# Patient Record
Sex: Female | Born: 1940 | Race: White | Hispanic: No | Smoking: Never smoker
Health system: Southern US, Community
[De-identification: ages and names within clinical notes are randomized; demographics above are authoritative.]

## PROBLEM LIST (undated history)

## (undated) DIAGNOSIS — E559 Vitamin D deficiency, unspecified: Secondary | ICD-10-CM

## (undated) DIAGNOSIS — E669 Obesity, unspecified: Secondary | ICD-10-CM

## (undated) DIAGNOSIS — F419 Anxiety disorder, unspecified: Secondary | ICD-10-CM

## (undated) DIAGNOSIS — E7800 Pure hypercholesterolemia, unspecified: Secondary | ICD-10-CM

## (undated) DIAGNOSIS — I272 Pulmonary hypertension, unspecified: Secondary | ICD-10-CM

## (undated) DIAGNOSIS — M8580 Other specified disorders of bone density and structure, unspecified site: Secondary | ICD-10-CM

## (undated) DIAGNOSIS — I77810 Thoracic aortic ectasia: Secondary | ICD-10-CM

## (undated) DIAGNOSIS — I10 Essential (primary) hypertension: Secondary | ICD-10-CM

## (undated) DIAGNOSIS — M25519 Pain in unspecified shoulder: Secondary | ICD-10-CM

## (undated) DIAGNOSIS — R011 Cardiac murmur, unspecified: Secondary | ICD-10-CM

## (undated) DIAGNOSIS — J189 Pneumonia, unspecified organism: Secondary | ICD-10-CM

## (undated) DIAGNOSIS — I350 Nonrheumatic aortic (valve) stenosis: Secondary | ICD-10-CM

## (undated) DIAGNOSIS — I6529 Occlusion and stenosis of unspecified carotid artery: Secondary | ICD-10-CM

## (undated) DIAGNOSIS — R87629 Unspecified abnormal cytological findings in specimens from vagina: Secondary | ICD-10-CM

## (undated) DIAGNOSIS — R001 Bradycardia, unspecified: Secondary | ICD-10-CM

## (undated) DIAGNOSIS — I2510 Atherosclerotic heart disease of native coronary artery without angina pectoris: Secondary | ICD-10-CM

## (undated) HISTORY — DX: Vitamin D deficiency, unspecified: E55.9

## (undated) HISTORY — DX: Obesity, unspecified: E66.9

## (undated) HISTORY — DX: Nonrheumatic aortic (valve) stenosis: I35.0

## (undated) HISTORY — DX: Pain in unspecified shoulder: M25.519

## (undated) HISTORY — DX: Thoracic aortic ectasia: I77.810

## (undated) HISTORY — DX: Pulmonary hypertension, unspecified: I27.20

## (undated) HISTORY — DX: Pure hypercholesterolemia, unspecified: E78.00

## (undated) HISTORY — DX: Occlusion and stenosis of unspecified carotid artery: I65.29

## (undated) HISTORY — DX: Essential (primary) hypertension: I10

## (undated) HISTORY — DX: Other specified disorders of bone density and structure, unspecified site: M85.80

## (undated) HISTORY — DX: Bradycardia, unspecified: R00.1

## (undated) HISTORY — PX: CATARACT EXTRACTION, BILATERAL: SHX1313

## (undated) HISTORY — PX: TONSILLECTOMY: SUR1361

## (undated) HISTORY — DX: Unspecified abnormal cytological findings in specimens from vagina: R87.629

## (undated) HISTORY — DX: Pneumonia, unspecified organism: J18.9

## (undated) HISTORY — DX: Atherosclerotic heart disease of native coronary artery without angina pectoris: I25.10

## (undated) HISTORY — PX: CARDIAC CATHETERIZATION: SHX172

---

## 1988-04-08 HISTORY — PX: CHOLECYSTECTOMY: SHX55

## 1992-04-08 DIAGNOSIS — R87629 Unspecified abnormal cytological findings in specimens from vagina: Secondary | ICD-10-CM

## 1992-04-08 HISTORY — DX: Unspecified abnormal cytological findings in specimens from vagina: R87.629

## 1993-04-08 DIAGNOSIS — J189 Pneumonia, unspecified organism: Secondary | ICD-10-CM

## 1993-04-08 HISTORY — DX: Pneumonia, unspecified organism: J18.9

## 1999-01-29 ENCOUNTER — Encounter: Payer: Self-pay | Admitting: Obstetrics and Gynecology

## 1999-01-29 ENCOUNTER — Encounter: Admission: RE | Admit: 1999-01-29 | Discharge: 1999-01-29 | Payer: Self-pay | Admitting: Obstetrics and Gynecology

## 1999-07-05 ENCOUNTER — Encounter: Payer: Self-pay | Admitting: Internal Medicine

## 1999-07-05 ENCOUNTER — Encounter: Admission: RE | Admit: 1999-07-05 | Discharge: 1999-07-05 | Payer: Self-pay | Admitting: Internal Medicine

## 2000-09-04 ENCOUNTER — Encounter: Admission: RE | Admit: 2000-09-04 | Discharge: 2000-09-04 | Payer: Self-pay | Admitting: Internal Medicine

## 2000-09-04 ENCOUNTER — Encounter: Payer: Self-pay | Admitting: Internal Medicine

## 2001-09-08 ENCOUNTER — Encounter: Admission: RE | Admit: 2001-09-08 | Discharge: 2001-09-08 | Payer: Self-pay | Admitting: Internal Medicine

## 2001-09-08 ENCOUNTER — Encounter: Payer: Self-pay | Admitting: Internal Medicine

## 2001-09-14 ENCOUNTER — Encounter: Admission: RE | Admit: 2001-09-14 | Discharge: 2001-09-14 | Payer: Self-pay | Admitting: Internal Medicine

## 2001-09-14 ENCOUNTER — Encounter: Payer: Self-pay | Admitting: Internal Medicine

## 2002-09-20 ENCOUNTER — Encounter: Admission: RE | Admit: 2002-09-20 | Discharge: 2002-09-20 | Payer: Self-pay | Admitting: Internal Medicine

## 2002-09-20 ENCOUNTER — Encounter: Payer: Self-pay | Admitting: Internal Medicine

## 2003-09-21 ENCOUNTER — Encounter: Admission: RE | Admit: 2003-09-21 | Discharge: 2003-09-21 | Payer: Self-pay | Admitting: Internal Medicine

## 2004-08-20 ENCOUNTER — Other Ambulatory Visit: Admission: RE | Admit: 2004-08-20 | Discharge: 2004-08-20 | Payer: Self-pay | Admitting: Internal Medicine

## 2004-09-24 ENCOUNTER — Encounter: Admission: RE | Admit: 2004-09-24 | Discharge: 2004-09-24 | Payer: Self-pay | Admitting: Internal Medicine

## 2005-09-25 ENCOUNTER — Encounter: Admission: RE | Admit: 2005-09-25 | Discharge: 2005-09-25 | Payer: Self-pay | Admitting: Internal Medicine

## 2006-04-08 DIAGNOSIS — I2510 Atherosclerotic heart disease of native coronary artery without angina pectoris: Secondary | ICD-10-CM

## 2006-04-08 HISTORY — DX: Atherosclerotic heart disease of native coronary artery without angina pectoris: I25.10

## 2006-04-28 ENCOUNTER — Encounter: Admission: RE | Admit: 2006-04-28 | Discharge: 2006-04-28 | Payer: Self-pay | Admitting: Cardiology

## 2006-05-02 ENCOUNTER — Ambulatory Visit (HOSPITAL_COMMUNITY): Admission: RE | Admit: 2006-05-02 | Discharge: 2006-05-02 | Payer: Self-pay | Admitting: Cardiology

## 2006-09-29 ENCOUNTER — Encounter: Admission: RE | Admit: 2006-09-29 | Discharge: 2006-09-29 | Payer: Self-pay | Admitting: Internal Medicine

## 2008-05-13 ENCOUNTER — Other Ambulatory Visit: Admission: RE | Admit: 2008-05-13 | Discharge: 2008-05-13 | Payer: Self-pay | Admitting: Internal Medicine

## 2008-05-26 ENCOUNTER — Encounter: Admission: RE | Admit: 2008-05-26 | Discharge: 2008-05-26 | Payer: Self-pay | Admitting: Internal Medicine

## 2009-05-29 ENCOUNTER — Encounter: Admission: RE | Admit: 2009-05-29 | Discharge: 2009-05-29 | Payer: Self-pay | Admitting: Internal Medicine

## 2010-04-28 ENCOUNTER — Other Ambulatory Visit: Payer: Self-pay | Admitting: Internal Medicine

## 2010-04-28 DIAGNOSIS — Z1239 Encounter for other screening for malignant neoplasm of breast: Secondary | ICD-10-CM

## 2010-05-30 ENCOUNTER — Ambulatory Visit
Admission: RE | Admit: 2010-05-30 | Discharge: 2010-05-30 | Disposition: A | Discharge status: Home / Self Care | Payer: Medicare Other | Source: Ambulatory Visit | Attending: Internal Medicine | Admitting: Internal Medicine

## 2010-05-30 DIAGNOSIS — Z1239 Encounter for other screening for malignant neoplasm of breast: Secondary | ICD-10-CM

## 2010-08-24 NOTE — H&P (Signed)
[name], [name]                  ACCOUNT NO.:  [id number]   MEDICAL RECORD NO.:  [id number]          PATIENT TYPE:  OIB   LOCATION:  2899                         FACILITY:  [facility]   PHYSICIAN:  Armanda Magic, M.D.     DATE OF BIRTH:  [birth year]   DATE OF ADMISSION:  05/02/2006  DATE OF DISCHARGE:  05/02/2006                              HISTORY & PHYSICAL   PRIMARY CARE PHYSICIAN:  Theressa Millard, M.D.   REASON FOR EVALUATION:  Chest pain and abnormal Cardiolite.   HISTORY OF PRESENT ILLNESS:  [name] is a 70 year old Caucasian  female with a history of obesity, hypertension and moderate aortic  stenosis by 2-D echo in 2006.  She also has a history of dyslipidemia  and recently underwent an exercise treadmill test secondary to multiple  cardiac risk factors.  The exercise treadmill test was performed by Dr.  Theressa Millard which was abnormal.  She was then referred to Dr. Armanda Magic by Dr. Earl Gala for stress Cardiolite.  She admits to having chest  pressure with both an exertional and a nonexertional component.  She  also admits to being under a great deal of stress secondary to her  husband undergoing cardiac surgery for coronary artery disease.  Her  stress Cardiolite revealed a small area of decreased uptake in the basal  septum which was felt to be secondary to breast attenuation however,  coronary artery disease could not be ruled out.  LV function was normal  and an EKG was positive for ischemia.  She is being admitted as an  outpatient for a diagnostic cardiac catheterization to rule out  ischemia.  Today the patient denied chest pain, shortness of breath,  nausea, vomiting, diaphoresis, or dizziness.   PAST MEDICAL HISTORY:  1. Obesity.  2. Hypertension.  3. Dyslipidemia.  4. Moderate aortic stenosis by 2-D echocardiogram in May 2006.  5. Fibrocystic breast disease.  6. Orthopedic pain.  7. Osteopenia.  8. Impaired fasting glucose.  9. Pneumonia.   PAST  SURGICAL HISTORY:  1. Status post cholecystectomy.  2. Status post tonsillectomy.  3. Status post arthroscopy of right knee.   ALLERGIES:  1. LACTOSE INTOLERANCE.  2. CODEINE.  3. SULFA.  4. PENICILLIN.   CURRENT MEDICATIONS:  1. Hydrochlorothiazide 25 mg daily.  2. Lisinopril 5 mg daily.   FAMILY HISTORY:  1. Mother living age 21 with memory loss.  2. Father deceased, renal failure following an MI and other      cardiovascular disease problems.  3. Brother living with coronary artery disease.  4. Sister living.  5. Sister living with pacemaker.   SOCIAL HISTORY:  Married.  Lives with her husband.  She denies tobacco,  alcohol, or illicit drug use.  She has been traveling to [place] once a  week to work in the [employer].   REVIEW OF SYSTEMS:  All other systems reviewed are negative other than  what is stated in the HPI.   PHYSICAL EXAMINATION:  GENERAL:  A 70 year old female, pleasant and  cooperative, NAD.  VITALS:  Temperature 98.0, heart rate  74, respirations 18, blood  pressure 170/76, O2 saturation is 99% over room air.  Height 5 feet 1-  1/2 inches, weight 79.5 kg.  HEENT:  Unremarkable.  NECK:  Supple without JVD or bilateral carotid bruits.  PULMONARY:  Breath sounds are clear to auscultation bilaterally.  No use  of accessory muscles.  CV:  Regular rate and rhythm.  Normal S1-S2 with a 2/6 systolic murmur  at the right upper sternal border radiating to the left lower sternal  border.  ABDOMEN:  Benign.  EXTREMITIES:  No peripheral edema, cyanosis, or clubbing.  DP and  femoral pulses 2+/2, bilaterally.  Groin: No femoral bruits bilaterally.  SKIN:  Warm and dry without rashes or lesions.  NEUROLOGICAL:  No focal motor or sensory deficits.  PSYCHIATRIC:  Normal mood and affect.  BACK:  No kyphosis or scoliosis.   LABORATORY DATA:  Outpatient precardiac catheterization laboratory data  as previously obtained, stress Cardiolite, baseline EKG showed normal   sinus rhythm with nonspecific T-wave abnormality.  The patient's stress  Cardiolite revealed a small area of decreased uptake in the basal septum  which was felt to be probably secondary to breast attenuation however  ischemia could not be ruled out.  LV function was normal and EKG was  positive for ischemia.   ASSESSMENT:  1. Chest pain syndrome.  2. Abnormal stress Cardiolite.  3. Systolic heart murmur.  4. Moderate aortic stenosis by 2-D echocardiogram in May 2006.  5. Hypertension.  6. Obesity.  7. Borderline glucose intolerance  8. Dyslipidemia.   PLAN:  The patient is being admitted for the diagnostic cardiac  catheterization to be performed by Dr. Armanda Magic.  A right and left  heart catheterization will be performed to not only assess for coronary  artery disease but also for significance of aortic stenosis.  The  cardiac catheterization procedure, risks, and potential complications of  the  catheterization procedure have been explained to the patient including  MI, CVA, death, bleeding, but he is a bleeding complications, IV  contrast dye allergy, renal insufficiency, and vascular complications  including bleeding, hematoma, and pseudoaneurysm.  The patient admitted  to understanding of the information and wished to proceed.      [name], [place]      Armanda Magic, M.D.  Electronically Signed    RDM/MEDQ  D:  08/06/2006  T:  08/06/2006  Job:  [id number]   cc:   Theressa Millard, M.D.

## 2010-08-24 NOTE — Cardiovascular Report (Signed)
NAME:  [name], [name]                      ACCOUNT NO.:  0   MEDICAL RECORD NO.:  [id number]          PATIENT TYPE:  OIB   LOCATION:  2899                         FACILITY:  [facility]   PHYSICIAN:  Armanda Magic, M.D.     DATE OF BIRTH:  16-Aug-1940   DATE OF PROCEDURE:  05/02/2006  DATE OF DISCHARGE:                            CARDIAC CATHETERIZATION   REFERRING PHYSICIAN:  Theressa Millard, M.D.   PROCEDURE:  The left heart catheterization which was attempted but  unsuccessful and coronary angiography.   OPERATOR:  Armanda Magic, M.D.   INDICATIONS:  Shortness of breath, abnormal Cardiolite.   COMPLICATIONS:  None.   IV ACCESS:  Via right femoral artery 6-French sheath   This a 70 year old white female with a history of obesity, hypertension  and moderate aortic stenosis by echo in 2006, also history of  dyslipidemia and some shortness of breath with exertion, who underwent  an exercise treadmill test because of multiple cardiac risk factors,  which was abnormal and subsequently underwent stress Cardiolite study,  which revealed a reversible defect in the inferior septum at the base.  She now presents for cardiac catheterization.   The patient is brought to cardiac catheterization laboratory in a  fasting nonsedated state.  Informed consent was obtained.  The patient  was connected to continuous heart rate and pulse oximetry monitoring and  intermittent blood pressure monitoring.  The right groin was prepped and  draped in sterile fashion.  One percent Xylocaine was used for local  anesthesia.  Using modified Seldinger technique, a 6-French sheath was  placed in the right femoral artery.  Under fluoroscopic guidance, a 6-  [place] JL-4 catheter was placed in the left coronary artery.  Multiple  cine films were taken at 30 degree RAO and 40 degree LAO views.  Catheter was then exchanged out over a guidewire for 6-French JR-4  catheter, which was placed under fluoroscopic guidance in the  right  coronary artery.  Multiple cine films taken at 30 degree RAO and 40  degree LAO views.  Catheter was then exchanged out over a guidewire for  a 6-French angled pigtail catheter, which was advanced into the area of  the aortic valve.  After multiple attempts at crossing the aortic valve  with the pigtail, it was exchanged out over a guidewire for a 6-French  JR-4 catheter and attempts to try to reguide the guide wire across the  aortic valve.  This was unsuccessful.  Then guidewire was exchanged out  for a straight wire and after multiple attempts at probing the aortic  valve, it was unable to be crossed and the catheter and wire were  removed.  All catheters and sheaths were removed.  Manual compression  was performed after adequate hemostasis was obtained.  The patient  transferred back to his room in stable condition.  The patient tolerated  procedure well.   RESULTS:  Left main coronary artery is widely patent and trifurcates  into left anterior descending artery, small ramus branch and left  circumflex.   Left anterior descending artery  has an ostial 30% narrowing, then gives  rise to a first diagonal branch, which is widely patent but small, and  bifurcates into daughter branches.  After the takeoff of the first  diagonal, there is a 20% to 30% narrowing in the proximal to mid LAD.  Then the LAD gives rise to a second diagonal, which again is not very  large and bifurcates into 2 daughter vessels.  The ongoing LAD traverses  the apex and is widely patent.   The left circumflex is widely patent throughout its course in the AV  groove, giving rise to a first obtuse marginal branch and then a large  second obtuse marginal branch, both of which are widely patent.  There  is also a small ramus branch that comes off at the trifurcation, which  is a very small vessel but patent.   The right coronary artery is widely patent throughout its course and  distally bifurcates into  posterior descending artery and posterolateral  artery, both of which are widely patent.   LEFT VENTRICULOGRAPHY:  Left ventricular and aortic pressures were not  obtained because of unsuccess after multiple attempts at crossing the  aortic valve with a guidewire.   ASSESSMENT:  1. Nonobstructive coronary disease.  2. History of moderate aortic stenosis by 2-D echocardiogram done in      2006.  Unable to cross the aortic valve today.  3. Hypertension.   PLAN:  Discharge to home after IV fluid and bedrest.  A 2-D  echocardiogram in our office to reassess the significance of the aortic  stenosis and left ventricular function.  She will follow up with me in 2  weeks.      Armanda Magic, M.D.  Electronically Signed     TT/MEDQ  D:  05/02/2006  T:  05/02/2006  Job:  [id number]   cc:   Theressa Millard, M.D.

## 2011-04-23 ENCOUNTER — Other Ambulatory Visit: Payer: Self-pay | Admitting: Internal Medicine

## 2011-04-23 DIAGNOSIS — Z1231 Encounter for screening mammogram for malignant neoplasm of breast: Secondary | ICD-10-CM

## 2011-06-03 ENCOUNTER — Ambulatory Visit
Admission: RE | Admit: 2011-06-03 | Discharge: 2011-06-03 | Disposition: A | Discharge status: Home / Self Care | Payer: BC Managed Care – PPO | Source: Ambulatory Visit | Attending: Internal Medicine | Admitting: Internal Medicine

## 2011-06-03 DIAGNOSIS — Z1231 Encounter for screening mammogram for malignant neoplasm of breast: Secondary | ICD-10-CM

## 2012-04-29 ENCOUNTER — Other Ambulatory Visit: Payer: Self-pay | Admitting: Internal Medicine

## 2012-04-29 DIAGNOSIS — Z1231 Encounter for screening mammogram for malignant neoplasm of breast: Secondary | ICD-10-CM

## 2012-06-03 ENCOUNTER — Ambulatory Visit
Admission: RE | Admit: 2012-06-03 | Discharge: 2012-06-03 | Disposition: A | Discharge status: Home / Self Care | Payer: Medicare PPO | Source: Ambulatory Visit | Attending: Internal Medicine | Admitting: Internal Medicine

## 2013-05-05 ENCOUNTER — Other Ambulatory Visit: Payer: Self-pay

## 2013-05-05 DIAGNOSIS — Z1231 Encounter for screening mammogram for malignant neoplasm of breast: Secondary | ICD-10-CM

## 2013-06-07 ENCOUNTER — Ambulatory Visit
Admission: RE | Admit: 2013-06-07 | Discharge: 2013-06-07 | Disposition: A | Discharge status: Home / Self Care | Payer: Self-pay | Source: Ambulatory Visit

## 2013-06-07 DIAGNOSIS — Z1231 Encounter for screening mammogram for malignant neoplasm of breast: Secondary | ICD-10-CM

## 2013-08-04 ENCOUNTER — Encounter: Payer: Self-pay | Admitting: General Surgery

## 2013-08-04 DIAGNOSIS — I359 Nonrheumatic aortic valve disorder, unspecified: Secondary | ICD-10-CM

## 2013-08-04 DIAGNOSIS — I10 Essential (primary) hypertension: Secondary | ICD-10-CM

## 2013-08-18 ENCOUNTER — Encounter: Payer: Self-pay | Admitting: Cardiology

## 2013-08-18 ENCOUNTER — Ambulatory Visit (INDEPENDENT_AMBULATORY_CARE_PROVIDER_SITE_OTHER): Payer: Medicare PPO | Admitting: Cardiology

## 2013-08-18 VITALS — BP 138/74 | HR 52 | Ht 61.0 in | Wt 177.8 lb

## 2013-08-18 DIAGNOSIS — I10 Essential (primary) hypertension: Secondary | ICD-10-CM

## 2013-08-18 DIAGNOSIS — R0602 Shortness of breath: Secondary | ICD-10-CM

## 2013-08-18 DIAGNOSIS — I359 Nonrheumatic aortic valve disorder, unspecified: Secondary | ICD-10-CM

## 2013-08-18 DIAGNOSIS — I350 Nonrheumatic aortic (valve) stenosis: Secondary | ICD-10-CM

## 2013-08-18 DIAGNOSIS — I2510 Atherosclerotic heart disease of native coronary artery without angina pectoris: Secondary | ICD-10-CM

## 2013-08-18 NOTE — Progress Notes (Signed)
[address] [place], [place]  [address] Phone: [phone] Fax:  [phone]  Date:  08/18/2013   ID:  [name], DOB 1940/04/29, MRN [id number]  PCP:  Horton Finer, MD  Cardiologist:  Fransico Him, MD     History of Present Illness: [name] is a 73 y.o. female with a history of HTN and aortic stenosis who presents today for cardiac eval.  She has a history of moderate AS by echo in 2008 and no evaluation since then.  She recently has noticed SOB over the past 6 months.  It is intermittent and she is able to walk everyday but when she notices DOE is walking up a hill.  She denies any chest pain or pressure.  She denies palpitations, LE edema, dizziness or syncope.   Wt Readings from Last 3 Encounters:  08/18/13 177 lb 12.8 oz (80.65 kg)     Past Medical History  Diagnosis Date  . Obesity   . Abnormal vaginal Pap smear 1994    annual paps for years after that.more recently every other year,last in 2012-we agreed not to do them anymore  . Hypercholesteremia   . Osteopenia     declines treatment  . Aortic stenosis   . Pneumonia 1995  . Vitamin D insufficiency   . Shoulder pain     Due to arthritis  . Hypertension   . Coronary artery disease 2008    nonobstructive ASCAD with 30% LAD    Current Outpatient Prescriptions  Medication Sig Dispense Refill  . aspirin 81 MG tablet Take 81 mg by mouth daily.      . Cholecalciferol (VITAMIN D) 2000 UNITS tablet Take 2,000 Units by mouth every other day.      . hydrochlorothiazide (HYDRODIURIL) 25 MG tablet Take 25 mg by mouth daily.      Marland Kitchen lisinopril (PRINIVIL,ZESTRIL) 10 MG tablet Take 10 mg by mouth daily.       No current facility-administered medications for this visit.    Allergies:    Allergies  Allergen Reactions  . Codeine   . Lipitor [Atorvastatin]     Stomach pain  . Penicillins   . Pravastatin     Myalgias   . Simvastatin     Myalgias   . Sulfa Antibiotics     Social History:   The patient  reports that she has never smoked. She does not have any smokeless tobacco history on file. She reports that she does not drink alcohol or use illicit drugs.   Family History:  The patient's family history includes Arrhythmia in her sister; Dementia in her mother; Heart attack in her brother and father; Heart disease in her brother and father.   ROS:  Please see the history of present illness.      All other systems reviewed and negative.   PHYSICAL EXAM: VS:  BP 138/74  Pulse 52  Ht 5\' 1"  (1.549 m)  Wt 177 lb 12.8 oz (80.65 kg)  BMI 33.61 kg/m2 Well nourished, well developed, in no acute distress HEENT: normal Neck: no JVD Cardiac:  normal S1, S2; RRR; no murmur Lungs:  clear to auscultation bilaterally, no wheezing, rhonchi or rales Abd: soft, nontender, no hepatomegaly Ext: no edema Skin: warm and dry Neuro:  CNs 2-12 intact, no focal abnormalities noted  EKG:  Sinus bradycardia at 52pm with rSR' and LVH by voltage     ASSESSMENT AND PLAN:  1. DOE which may be from obesity but  need to rule out worsening AS or underlying CAD. - I will get an echo and if echo shows severe AS will proceed with right and left heart cath - If AS has not progressed with proceed with Stress myoview 2. Moderate AS by echo 2008 but by exam today the murmur is late peaking so may now be severe AS 3. HTN well controlled - continue Lisinopril/HCTZ 4.  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

## 2013-08-18 NOTE — Patient Instructions (Signed)
Your physician recommends that you continue on your current medications as directed. Please refer to the Current Medication list given to you today.  Your physician has requested that you have an echocardiogram. Echocardiography is a painless test that uses sound waves to create images of your heart. It provides your doctor with information about the size and shape of your heart and how well your heart's chambers and valves are working. This procedure takes approximately one hour. There are no restrictions for this procedure.  Your physician wants you to follow-up in: 6 month ov You will receive a reminder letter in the mail two months in advance. If you don't receive a letter, please call our office to schedule the follow-up appointment.

## 2013-09-03 ENCOUNTER — Ambulatory Visit (HOSPITAL_COMMUNITY): Payer: Medicare PPO | Attending: Cardiology | Admitting: Radiology

## 2013-09-03 DIAGNOSIS — I350 Nonrheumatic aortic (valve) stenosis: Secondary | ICD-10-CM

## 2013-09-03 DIAGNOSIS — I359 Nonrheumatic aortic valve disorder, unspecified: Secondary | ICD-10-CM | POA: Insufficient documentation

## 2013-09-03 NOTE — Progress Notes (Signed)
Echocardiogram performed.  

## 2013-09-06 ENCOUNTER — Encounter: Payer: Self-pay | Admitting: Cardiology

## 2013-09-06 ENCOUNTER — Telehealth: Payer: Self-pay | Admitting: Cardiology

## 2013-09-06 DIAGNOSIS — I350 Nonrheumatic aortic (valve) stenosis: Secondary | ICD-10-CM

## 2013-09-06 NOTE — Telephone Encounter (Signed)
Labs ordered for cardiac cath.

## 2013-09-07 ENCOUNTER — Encounter (HOSPITAL_COMMUNITY): Payer: Self-pay

## 2013-09-07 ENCOUNTER — Other Ambulatory Visit: Payer: Self-pay | Admitting: Cardiology

## 2013-09-07 DIAGNOSIS — I350 Nonrheumatic aortic (valve) stenosis: Secondary | ICD-10-CM

## 2013-09-07 NOTE — H&P (Signed)
[address], [place]  [place], [place] [address]  Phone: [phone]  Fax: [phone]    ID: [name], [name] 1941/03/05, MRN [id number]   PCP: Horton Finer, MD  Cardiologist: Fransico Him, MD   History of Present Illness:  [name] is a 73 y.o. female with a history of HTN and aortic stenosis who presents today for cardiac eval. She has a history of moderate AS by echo in 2008 and no evaluation since then. She recently has noticed SOB over the past 6 months. It is intermittent and she is able to walk everyday but when she notices DOE is walking up a hill. She denies any chest pain or pressure. She denies palpitations, LE edema, dizziness or syncope. Recent 2D echo showed progression of AS now severe.    Wt Readings from Last 3 Encounters:   08/18/13  177 lb 12.8 oz (80.65 kg)    Past Medical History   Diagnosis  Date   .  Obesity    .  Abnormal vaginal Pap smear  1994     annual paps for years after that.more recently every other year,last in 2012-we agreed not to do them anymore   .  Hypercholesteremia    .  Osteopenia      declines treatment   .  Aortic stenosis    .  Pneumonia  1995   .  Vitamin D insufficiency    .  Shoulder pain      Due to arthritis   .  Hypertension    .  Coronary artery disease  2008     nonobstructive ASCAD with 30% LAD    Current Outpatient Prescriptions   Medication  Sig  Dispense  Refill   .  aspirin 81 MG tablet  Take 81 mg by mouth daily.     .  Cholecalciferol (VITAMIN D) 2000 UNITS tablet  Take 2,000 Units by mouth every other day.     .  hydrochlorothiazide (HYDRODIURIL) 25 MG tablet  Take 25 mg by mouth daily.     Marland Kitchen  lisinopril (PRINIVIL,ZESTRIL) 10 MG tablet  Take 10 mg by mouth daily.      No current facility-administered medications for this visit.   Allergies:  Allergies   Allergen  Reactions   .  Codeine    .  Lipitor [Atorvastatin]      Stomach pain   .  Penicillins    .  Pravastatin      Myalgias   .   Simvastatin      Myalgias   .  Sulfa Antibiotics    Social History: The patient reports that she has never smoked. She does not have any smokeless tobacco history on file. She reports that she does not drink alcohol or use illicit drugs.  Family History: The patient's family history includes Arrhythmia in her sister; Dementia in her mother; Heart attack in her brother and father; Heart disease in her brother and father.  ROS: Please see the history of present illness. All other systems reviewed and negative.  PHYSICAL EXAM:  VS: BP 138/74  Pulse 52  Ht 5\' 1"  (1.549 m)  Wt 177 lb 12.8 oz (80.65 kg)  BMI 33.61 kg/m2  Well nourished, well developed, in no acute distress  HEENT: normal  Neck: no JVD  Cardiac: normal S1, S2; RRR; no murmur  Lungs: clear to auscultation bilaterally, no wheezing, rhonchi or rales  Abd: soft, nontender, no hepatomegaly  Ext:  no edema  Skin: warm and dry  Neuro: CNs 2-12 intact, no focal abnormalities noted  EKG: Sinus bradycardia at 52pm with rSR' and LVH by voltage  ASSESSMENT AND PLAN:  1. DOE which may be from obesity but need to rule out worsening AS or underlying CAD. - 2D echo showed severe AS will proceed with right and left heart cath  2. Severe AS by echo  3. HTN well controlled - continue Lisinopril/HCTZ        4. 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

## 2013-09-10 ENCOUNTER — Other Ambulatory Visit (INDEPENDENT_AMBULATORY_CARE_PROVIDER_SITE_OTHER): Payer: Medicare PPO

## 2013-09-10 DIAGNOSIS — I350 Nonrheumatic aortic (valve) stenosis: Secondary | ICD-10-CM

## 2013-09-10 DIAGNOSIS — I359 Nonrheumatic aortic valve disorder, unspecified: Secondary | ICD-10-CM

## 2013-09-10 LAB — CBC WITH DIFFERENTIAL/PLATELET
BASOS PCT: 0.4 % (ref 0.0–3.0)
Basophils Absolute: 0 10*3/uL (ref 0.0–0.1)
Eosinophils Absolute: 0.1 10*3/uL (ref 0.0–0.7)
Eosinophils Relative: 1.5 % (ref 0.0–5.0)
HEMATOCRIT: 39 % (ref 36.0–46.0)
Hemoglobin: 12.6 g/dL (ref 12.0–15.0)
LYMPHS ABS: 2.5 10*3/uL (ref 0.7–4.0)
Lymphocytes Relative: 30.7 % (ref 12.0–46.0)
MCHC: 32.4 g/dL (ref 30.0–36.0)
MCV: 91 fl (ref 78.0–100.0)
MONO ABS: 0.6 10*3/uL (ref 0.1–1.0)
Monocytes Relative: 7.2 % (ref 3.0–12.0)
Neutro Abs: 5 10*3/uL (ref 1.4–7.7)
Neutrophils Relative %: 60.2 % (ref 43.0–77.0)
PLATELETS: 231 10*3/uL (ref 150.0–400.0)
RBC: 4.28 Mil/uL (ref 3.87–5.11)
RDW: 14.2 % (ref 11.5–15.5)
WBC: 8.2 10*3/uL (ref 4.0–10.5)

## 2013-09-10 LAB — BASIC METABOLIC PANEL
BUN: 17 mg/dL (ref 6–23)
CHLORIDE: 105 meq/L (ref 96–112)
CO2: 30 mEq/L (ref 19–32)
Calcium: 9.4 mg/dL (ref 8.4–10.5)
Creatinine, Ser: 0.9 mg/dL (ref 0.4–1.2)
GFR: 69.25 mL/min (ref >60.00)
Glucose, Bld: 94 mg/dL (ref 70–99)
POTASSIUM: 3.9 meq/L (ref 3.5–5.1)
SODIUM: 140 meq/L (ref 135–145)

## 2013-09-10 LAB — PROTIME-INR
INR: 1 ratio (ref 0.8–1.0)
PROTHROMBIN TIME: 10.8 s (ref 9.6–13.1)

## 2013-09-16 ENCOUNTER — Encounter (HOSPITAL_COMMUNITY)
Admission: RE | Disposition: A | Discharge status: Home / Self Care | Payer: Self-pay | Source: Ambulatory Visit | Attending: Cardiology

## 2013-09-16 ENCOUNTER — Telehealth: Payer: Self-pay | Admitting: Cardiology

## 2013-09-16 ENCOUNTER — Ambulatory Visit (HOSPITAL_COMMUNITY)
Admission: RE | Admit: 2013-09-16 | Discharge: 2013-09-16 | Disposition: A | Discharge status: Home / Self Care | Payer: Medicare PPO | Source: Ambulatory Visit | Attending: Cardiology | Admitting: Cardiology

## 2013-09-16 DIAGNOSIS — R0602 Shortness of breath: Secondary | ICD-10-CM

## 2013-09-16 DIAGNOSIS — I359 Nonrheumatic aortic valve disorder, unspecified: Secondary | ICD-10-CM

## 2013-09-16 DIAGNOSIS — E669 Obesity, unspecified: Secondary | ICD-10-CM | POA: Insufficient documentation

## 2013-09-16 DIAGNOSIS — I350 Nonrheumatic aortic (valve) stenosis: Secondary | ICD-10-CM

## 2013-09-16 DIAGNOSIS — M899 Disorder of bone, unspecified: Secondary | ICD-10-CM | POA: Insufficient documentation

## 2013-09-16 DIAGNOSIS — I10 Essential (primary) hypertension: Secondary | ICD-10-CM

## 2013-09-16 DIAGNOSIS — E559 Vitamin D deficiency, unspecified: Secondary | ICD-10-CM | POA: Insufficient documentation

## 2013-09-16 DIAGNOSIS — M19019 Primary osteoarthritis, unspecified shoulder: Secondary | ICD-10-CM | POA: Insufficient documentation

## 2013-09-16 DIAGNOSIS — I2510 Atherosclerotic heart disease of native coronary artery without angina pectoris: Secondary | ICD-10-CM | POA: Insufficient documentation

## 2013-09-16 DIAGNOSIS — M949 Disorder of cartilage, unspecified: Secondary | ICD-10-CM

## 2013-09-16 DIAGNOSIS — E7800 Pure hypercholesterolemia, unspecified: Secondary | ICD-10-CM | POA: Insufficient documentation

## 2013-09-16 DIAGNOSIS — I2789 Other specified pulmonary heart diseases: Secondary | ICD-10-CM | POA: Insufficient documentation

## 2013-09-16 DIAGNOSIS — Z7982 Long term (current) use of aspirin: Secondary | ICD-10-CM | POA: Insufficient documentation

## 2013-09-16 HISTORY — PX: LEFT AND RIGHT HEART CATHETERIZATION WITH CORONARY ANGIOGRAM: SHX5449

## 2013-09-16 LAB — POCT I-STAT 3, ART BLOOD GAS (G3+)
Acid-Base Excess: 3 mmol/L — ABNORMAL HIGH (ref 0.0–2.0)
BICARBONATE: 27.7 meq/L — AB (ref 20.0–24.0)
O2 SAT: 94 %
PCO2 ART: 40.9 mmHg (ref 35.0–45.0)
TCO2: 29 mmol/L (ref 0–100)
pH, Arterial: 7.438 (ref 7.350–7.450)
pO2, Arterial: 70 mmHg — ABNORMAL LOW (ref 80.0–100.0)

## 2013-09-16 LAB — POCT I-STAT 3, VENOUS BLOOD GAS (G3P V)
ACID-BASE EXCESS: 3 mmol/L — AB (ref 0.0–2.0)
Acid-base deficit: 1 mmol/L (ref 0.0–2.0)
Bicarbonate: 24.9 mEq/L — ABNORMAL HIGH (ref 20.0–24.0)
Bicarbonate: 28.2 mEq/L — ABNORMAL HIGH (ref 20.0–24.0)
O2 SAT: 67 %
O2 SAT: 70 %
PH VEN: 7.368 — AB (ref 7.250–7.300)
TCO2: 26 mmol/L (ref 0–100)
TCO2: 30 mmol/L (ref 0–100)
pCO2, Ven: 43.3 mmHg — ABNORMAL LOW (ref 45.0–50.0)
pCO2, Ven: 44.6 mmHg — ABNORMAL LOW (ref 45.0–50.0)
pH, Ven: 7.41 — ABNORMAL HIGH (ref 7.250–7.300)
pO2, Ven: 36 mmHg (ref 30.0–45.0)
pO2, Ven: 37 mmHg (ref 30.0–45.0)

## 2013-09-16 SURGERY — LEFT AND RIGHT HEART CATHETERIZATION WITH CORONARY ANGIOGRAM
Anesthesia: LOCAL

## 2013-09-16 MED ORDER — SODIUM CHLORIDE 0.9 % IV SOLN: INTRAVENOUS | Status: DC

## 2013-09-16 MED ORDER — ASPIRIN 81 MG PO CHEW: 81.0000 mg | CHEWABLE_TABLET | ORAL | Status: DC

## 2013-09-16 MED ORDER — SODIUM CHLORIDE 0.9 % IV SOLN: 1.0000 mL/kg/h | INTRAVENOUS | Status: DC

## 2013-09-16 MED ORDER — SODIUM CHLORIDE 0.9 % IV SOLN: 250.0000 mL | INTRAVENOUS | Status: DC | PRN

## 2013-09-16 MED ORDER — SODIUM CHLORIDE 0.9 % IJ SOLN: 3.0000 mL | INTRAMUSCULAR | Status: DC | PRN

## 2013-09-16 MED ORDER — FENTANYL CITRATE 0.05 MG/ML IJ SOLN
INTRAMUSCULAR | Status: AC
  Filled 2013-09-16: qty 2

## 2013-09-16 MED ORDER — SODIUM CHLORIDE 0.9 % IJ SOLN: 3.0000 mL | Freq: Two times a day (BID) | INTRAMUSCULAR | Status: DC

## 2013-09-16 MED ORDER — MIDAZOLAM HCL 2 MG/2ML IJ SOLN
INTRAMUSCULAR | Status: AC
  Filled 2013-09-16: qty 2

## 2013-09-16 MED ORDER — LIDOCAINE HCL (PF) 1 % IJ SOLN
INTRAMUSCULAR | Status: AC
  Filled 2013-09-16: qty 30

## 2013-09-16 NOTE — Interval H&P Note (Signed)
History and Physical Interval Note:  09/16/2013 9:42 AM  [name]  has presented today for surgery, with the diagnosis of as  The various methods of treatment have been discussed with the patient and family. After consideration of risks, benefits and other options for treatment, the patient has consented to  Procedure(s): LEFT AND RIGHT HEART CATHETERIZATION WITH CORONARY ANGIOGRAM (N/A) as a surgical intervention .  The patient's history has been reviewed, patient examined, no change in status, stable for surgery.  I have reviewed the patient's chart and labs.  Questions were answered to the patient's satisfaction.     Elda Dunkerson R

## 2013-09-16 NOTE — CV Procedure (Signed)
    Cardiac Cath Procedure Note  Indication: SOB with severe AS by echo  Procedures performed:  1) Right heart cathererization 2) Selective coronary angiography 3) Left heart catheterization 4) Left ventriculogram  Description of procedure:     The risks and indication of the procedure were explained. Consent was signed and placed on the chart. An appropriate timeout was taken prior to the procedure. The right groin was prepped and draped in the routine sterile fashion and anesthetized with 1% local lidocaine.   A 5 FR arterial sheath was placed in the right femoral artery using a modified Seldinger technique. Standard catheters including a JL4, JR4 and angled pigtail were used. All catheter exchanges were made over a wire. A 7 FR venous sheath was placed in the right femoral vein using a modified Seldinger technique. A standard Swan-Ganz catheter was used for the procedure.   Complications:  None apparent  Findings:  RA = 12/11 with mean 51mmHg RV = 42/10 with mean 67mmHg PA = 39/19 with mean 48mmHg PCW = 27/22 with mean 85mmHg Fick cardiac output/index =5.15/2.88 PVR =116D/S FA sat = 94% PA sat = 70%  The catheter was not placed across the AV due to very severe AS documented at echo.  Left main:widely patent and bifurcates into an LAD and left circumflex arteries  LAD: Widely patent throughout.   It gives rise to a small first diagonal which is patent.  It then gives rise in the mid portion to a second moderate sized diagonal #2 which is patent and then a 3rd diagonal which is small and patent.  The ongoing LAD traverses to the apex and is patent.    LCX: Widely patent throughout its course in the AV groove.  It gives rise to a moderate sized OM#1 which is patent and then gives rise to a large OM#2 which is widely patent.  The ongoing left circuflex tapers into a small vessel that is patent.    RJJ:OACZYS patent throughout its course.  It bifurcates distally into a PDA and  PL branches which are patent.    LV-gram not done due to severe AS  Assessment: 1.  Normal coronary arteries 2.  Normal LVF by Echo 3.  Severe AS by echo with mean AVG 68mmHg 4.  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  Elevated PCW secondary to #4  Plan/Discussion: 1.  D/C home when bedrest completed 2.  Outpt consult with Dr. Prescott Gum for AVR 3. Followup with Richardson Dopp PA-C in 2 weeks  Sueanne Margarita, MD 10:58 AM

## 2013-09-16 NOTE — Discharge Instructions (Signed)

## 2013-09-16 NOTE — Telephone Encounter (Signed)
Referral sent. [facility] will call pt with appt. Do I need to call pt to let her know why we are referring her?

## 2013-09-16 NOTE — Telephone Encounter (Signed)
Patient had cardiac cath showing normal coronary arteries. She has severe AS by echo.  Please set up CVTS consult with Dr. Prescott Gum for AVR

## 2013-09-16 NOTE — Telephone Encounter (Signed)
Per Dr. Radford Pax pt aware of consult.

## 2013-09-16 NOTE — H&P (Signed)
[address] [place], [place]  [address] Phone: [phone] Fax:  [phone]   Date:  08/18/2013    ID:  ALIANNAH HOLSTROM, DOB [birth year], MRN [id number]   PCP:  Horton Finer, MD     Cardiologist:  Fransico Him, MD              History of Present Illness: [name] is a 73 y.o. female with a history of HTN and aortic stenosis who presents today for cardiac eval.  She has a history of moderate AS by echo in 2008 and no evaluation since then.  She recently has noticed SOB over the past 6 months.  It is intermittent and she is able to walk everyday but when she notices DOE is walking up a hill.  She denies any chest pain or pressure.  She denies palpitations, LE edema, dizziness or syncope.      Wt Readings from Last 3 Encounters:   08/18/13  177 lb 12.8 oz (80.65 kg)         Past Medical History   Diagnosis  Date   .  Obesity     .  Abnormal vaginal Pap smear  1994       annual paps for years after that.more recently every other year,last in 2012-we agreed not to do them anymore   .  Hypercholesteremia     .  Osteopenia         declines treatment   .  Aortic stenosis     .  Pneumonia  1995   .  Vitamin D insufficiency     .  Shoulder pain         Due to arthritis   .  Hypertension     .  Coronary artery disease  2008       nonobstructive ASCAD with 30% LAD         Current Outpatient Prescriptions   Medication  Sig  Dispense  Refill   .  aspirin 81 MG tablet  Take 81 mg by mouth daily.         .  Cholecalciferol (VITAMIN D) 2000 UNITS tablet  Take 2,000 Units by mouth every other day.         .  hydrochlorothiazide (HYDRODIURIL) 25 MG tablet  Take 25 mg by mouth daily.         Marland Kitchen  lisinopril (PRINIVIL,ZESTRIL) 10 MG tablet  Take 10 mg by mouth daily.             No current facility-administered medications for this visit.        Allergies:    Allergies   Allergen  Reactions   .  Codeine     .  Lipitor [Atorvastatin]         Stomach  pain   .  Penicillins     .  Pravastatin         Myalgias    .  Simvastatin         Myalgias    .  Sulfa Antibiotics          Social History:  The patient  reports that she has never smoked. She does not have any smokeless tobacco history on file. She reports that she does not drink alcohol or use illicit drugs.    Family History:  The patient's family history includes Arrhythmia in her sister; Dementia in her mother; Heart attack in her brother and  father; Heart disease in her brother and father.    ROS:  Please see the history of present illness.      All other systems reviewed and negative.     PHYSICAL EXAM: VS:  BP 138/74  Pulse 52  Ht 5\' 1"  (1.549 m)  Wt 177 lb 12.8 oz (80.65 kg)  BMI 33.61 kg/m2 Well nourished, well developed, in no acute distress  HEENT: normal  Neck: no JVD  Cardiac:  normal S1, S2; RRR; no murmur  Lungs:  clear to auscultation bilaterally, no wheezing, rhonchi or rales  Abd: soft, nontender, no hepatomegaly  Ext: no edema  Skin: warm and dry  Neuro:  CNs 2-12 intact, no focal abnormalities noted   EKG:  Sinus bradycardia at 52pm with rSR' and LVH by voltage       ASSESSMENT AND PLAN:    DOE which may be from obesity but need to rule out worsening AS or underlying CAD. - I will get an echo and if echo shows severe AS will proceed with right and left heart cath - If AS has not progressed with proceed with Stress myoview Moderate AS by echo 2008 but by exam today the murmur is late peaking so may now be severe AS HTN well controlled - continue Lisinopril/HCTZ 4.  Nonobstructive ASCAD with 30% LAD at cath 2008 Signed, Jancie Kercher, MD 08/18/2013 9:59 AM   09/16/2013  2D echo showed severe AS with mean AV gradient 16mmHg.  Will proceed with left and right heart catheterization.  Cardiac catheterization was discussed with the patient fully including risks on myocardial infarction, death, stroke, bleeding, arrhythmia, dye allergy, renal  insufficiency or bleeding.  All patient questions and concerns were discussed and the patient understands and is willing to proceed.

## 2013-09-16 NOTE — Addendum Note (Signed)
Addended byUlla Potash H on: 09/16/2013 12:47 PM   Modules accepted: Orders

## 2013-09-22 ENCOUNTER — Emergency Department (HOSPITAL_COMMUNITY)
Admission: EM | Admit: 2013-09-22 | Discharge: 2013-09-23 | Disposition: A | Discharge status: Home / Self Care | Payer: Medicare PPO | Attending: Emergency Medicine | Admitting: Emergency Medicine

## 2013-09-22 ENCOUNTER — Telehealth: Payer: Self-pay | Admitting: Cardiology

## 2013-09-22 ENCOUNTER — Encounter (HOSPITAL_COMMUNITY): Payer: Self-pay | Admitting: Emergency Medicine

## 2013-09-22 ENCOUNTER — Emergency Department (HOSPITAL_COMMUNITY): Payer: Medicare PPO

## 2013-09-22 DIAGNOSIS — Z8739 Personal history of other diseases of the musculoskeletal system and connective tissue: Secondary | ICD-10-CM | POA: Insufficient documentation

## 2013-09-22 DIAGNOSIS — R079 Chest pain, unspecified: Secondary | ICD-10-CM | POA: Insufficient documentation

## 2013-09-22 DIAGNOSIS — Z7982 Long term (current) use of aspirin: Secondary | ICD-10-CM | POA: Insufficient documentation

## 2013-09-22 DIAGNOSIS — R0602 Shortness of breath: Secondary | ICD-10-CM | POA: Insufficient documentation

## 2013-09-22 DIAGNOSIS — R109 Unspecified abdominal pain: Secondary | ICD-10-CM | POA: Insufficient documentation

## 2013-09-22 DIAGNOSIS — I10 Essential (primary) hypertension: Secondary | ICD-10-CM | POA: Insufficient documentation

## 2013-09-22 DIAGNOSIS — M6281 Muscle weakness (generalized): Secondary | ICD-10-CM | POA: Insufficient documentation

## 2013-09-22 DIAGNOSIS — I359 Nonrheumatic aortic valve disorder, unspecified: Secondary | ICD-10-CM | POA: Insufficient documentation

## 2013-09-22 DIAGNOSIS — I350 Nonrheumatic aortic (valve) stenosis: Secondary | ICD-10-CM

## 2013-09-22 DIAGNOSIS — Z9889 Other specified postprocedural states: Secondary | ICD-10-CM | POA: Insufficient documentation

## 2013-09-22 DIAGNOSIS — R531 Weakness: Secondary | ICD-10-CM

## 2013-09-22 DIAGNOSIS — E669 Obesity, unspecified: Secondary | ICD-10-CM | POA: Insufficient documentation

## 2013-09-22 DIAGNOSIS — I2510 Atherosclerotic heart disease of native coronary artery without angina pectoris: Secondary | ICD-10-CM | POA: Insufficient documentation

## 2013-09-22 DIAGNOSIS — E559 Vitamin D deficiency, unspecified: Secondary | ICD-10-CM | POA: Insufficient documentation

## 2013-09-22 DIAGNOSIS — Z880 Allergy status to penicillin: Secondary | ICD-10-CM | POA: Insufficient documentation

## 2013-09-22 DIAGNOSIS — Z8701 Personal history of pneumonia (recurrent): Secondary | ICD-10-CM | POA: Insufficient documentation

## 2013-09-22 DIAGNOSIS — R011 Cardiac murmur, unspecified: Secondary | ICD-10-CM | POA: Insufficient documentation

## 2013-09-22 DIAGNOSIS — Z79899 Other long term (current) drug therapy: Secondary | ICD-10-CM | POA: Insufficient documentation

## 2013-09-22 LAB — CBC WITH DIFFERENTIAL/PLATELET
BASOS ABS: 0 10*3/uL (ref 0.0–0.1)
Basophils Relative: 1 % (ref 0–1)
Eosinophils Absolute: 0.1 10*3/uL (ref 0.0–0.7)
Eosinophils Relative: 1 % (ref 0–5)
HCT: 36.5 % (ref 36.0–46.0)
Hemoglobin: 11.9 g/dL — ABNORMAL LOW (ref 12.0–15.0)
LYMPHS ABS: 3.2 10*3/uL (ref 0.7–4.0)
LYMPHS PCT: 37 % (ref 12–46)
MCH: 29.5 pg (ref 26.0–34.0)
MCHC: 32.6 g/dL (ref 30.0–36.0)
MCV: 90.6 fL (ref 78.0–100.0)
Monocytes Absolute: 0.7 10*3/uL (ref 0.1–1.0)
Monocytes Relative: 8 % (ref 3–12)
NEUTROS PCT: 53 % (ref 43–77)
Neutro Abs: 4.8 10*3/uL (ref 1.7–7.7)
PLATELETS: 282 10*3/uL (ref 150–400)
RBC: 4.03 MIL/uL (ref 3.87–5.11)
RDW: 13.3 % (ref 11.5–15.5)
WBC: 8.8 10*3/uL (ref 4.0–10.5)

## 2013-09-22 LAB — COMPREHENSIVE METABOLIC PANEL
ALK PHOS: 90 U/L (ref 39–117)
ALT: 42 U/L — AB (ref 0–35)
AST: 30 U/L (ref 0–37)
Albumin: 3.6 g/dL (ref 3.5–5.2)
BUN: 17 mg/dL (ref 6–23)
CALCIUM: 9.1 mg/dL (ref 8.4–10.5)
CO2: 26 mEq/L (ref 19–32)
Chloride: 98 mEq/L (ref 96–112)
Creatinine, Ser: 0.8 mg/dL (ref 0.50–1.10)
GFR calc non Af Amer: 70 mL/min — ABNORMAL LOW (ref >90)
GFR, EST AFRICAN AMERICAN: 82 mL/min — AB (ref >90)
GLUCOSE: 105 mg/dL — AB (ref 70–99)
POTASSIUM: 3.4 meq/L — AB (ref 3.7–5.3)
SODIUM: 139 meq/L (ref 137–147)
Total Bilirubin: 0.3 mg/dL (ref 0.3–1.2)
Total Protein: 7 g/dL (ref 6.0–8.3)

## 2013-09-22 NOTE — ED Notes (Signed)
Pt. reports multiple complaints : fatigue , " feels tired" , poor appetite , abdominal swelling/bloated for  several days . Denies nausea or vomitting / no fever or chills.

## 2013-09-22 NOTE — Telephone Encounter (Signed)
Pt had her cath on 6/11 and before she had the cath she had no problems. Since the cath the pt stated that her stomach is swollen. It is extended per pt. In the morning it is fine, but after moving around she relapses the swelling occurs in her stomach then and she can barely stand to have her pants on. Right at the site where we inserted the cath is very sore per pt and bruising is getting larger and larger at the site. Pt complains of fatigue as well. Pt complains of mild chest pain with exertion. Pt also complains for SOB with exertion. She feels that come on just walking across the room. Pt denies any SOB or CP prior to the Heart Cath.

## 2013-09-22 NOTE — ED Notes (Signed)
Patient states she recently had a heart cath done and since has not felt herself. States she has intermittent chest pain described as cramping. Denies any diaphoresis, nausea or shortness of breath. Patient also states the chest pain comes at anytime with or without any activity.

## 2013-09-22 NOTE — Telephone Encounter (Signed)
New Prob   Pt has some questions and concerns regarding her last cath. Please call.

## 2013-09-22 NOTE — ED Notes (Signed)
Family at bedside. 

## 2013-09-22 NOTE — Telephone Encounter (Signed)
I spoke with Barnett Applebaum in regards to pt and we both had great concern about her symptoms. Barnett Applebaum advised that I have the pt go straight to the ER. I called Trish to make her aware that pt was coming to ER. Spoke with pt and she agreed to go to the ER and will have her husband drive her there. I will forward to Dr Radford Pax as a FYI and Dr Mare Ferrari who is on call at 6:30. [place] will [place] her about a hour to get to [employer] [employer]

## 2013-09-22 NOTE — ED Provider Notes (Signed)
CSN: [id number]     Arrival date & time 09/22/13  1812 History   First MD Initiated Contact with Patient 09/22/13 2308     Chief Complaint  Patient presents with  . Fatigue     (Consider location/radiation/quality/duration/timing/severity/associated sxs/prior Treatment) HPI Comments: Pt comes in with cc of fatigue. Pt has hx AS, recent heart cath via right sided femoral access. Pt reports that since the catheterization, she has fatigue, poor appetite, chest heavyness, abdominal bloating. Pt has some dib with exertion, but she hasn't exerted herself too much. She denies any headaches, n/v/f/c. + dizziness, but doesn't think she is about to faint. Site of the catheterization appears swollen to her. Abd swelling is described as increase in swelling with her standing u and walking around, and reduction in the girth with ;aying down.   The history is provided by the patient.    Past Medical History  Diagnosis Date  . Obesity   . Abnormal vaginal Pap smear 1994    annual paps for years after that.more recently every other year,last in 2012-we agreed not to do them anymore  . Hypercholesteremia   . Osteopenia     declines treatment  . Aortic stenosis   . Pneumonia 1995  . Vitamin D insufficiency   . Shoulder pain     Due to arthritis  . Hypertension   . Coronary artery disease 2008    nonobstructive ASCAD with 30% LAD   Past Surgical History  Procedure Laterality Date  . Cholecystectomy    . Cardiac catheterization     Family History  Problem Relation Age of Onset  . Dementia Mother   . Heart disease Father   . Heart attack Father   . Heart disease Brother   . Heart attack Brother   . Arrhythmia Sister    History  Substance Use Topics  . Smoking status: Never Smoker   . Smokeless tobacco: Not on file  . Alcohol Use: No   OB History   Grav Para Term Preterm Abortions TAB SAB Ect Mult Living                 Review of Systems  Constitutional: Positive for activity  change, appetite change and fatigue. Negative for fever and diaphoresis.  HENT: Negative for facial swelling.   Respiratory: Positive for shortness of breath. Negative for cough and wheezing.   Cardiovascular: Positive for chest pain.  Gastrointestinal: Positive for abdominal distention. Negative for nausea, vomiting, abdominal pain, diarrhea, constipation and blood in stool.  Genitourinary: Negative for hematuria and difficulty urinating.  Musculoskeletal: Negative for neck pain.  Skin: Positive for rash and wound. Negative for color change.  Neurological: Negative for speech difficulty.  Hematological: Does not bruise/bleed easily.  Psychiatric/Behavioral: Negative for confusion.      Allergies  Codeine; Lipitor; Penicillins; Pravastatin; Simvastatin; and Sulfa antibiotics  Home Medications   Prior to Admission medications   Medication Sig Start Date End Date Taking? Authorizing Provider  aspirin 81 MG tablet Take 81 mg by mouth every other day.    Yes Historical Provider, MD  Cholecalciferol (VITAMIN D) 2000 UNITS tablet Take 2,000 Units by mouth every other day.   Yes Historical Provider, MD  hydrochlorothiazide (HYDRODIURIL) 25 MG tablet Take 25 mg by mouth daily.   Yes Historical Provider, MD  lisinopril (PRINIVIL,ZESTRIL) 10 MG tablet Take 10 mg by mouth daily.   Yes Historical Provider, MD   BP 120/46  Pulse 52  Temp(Src) 98.9 F (37.2 C) (Oral)  Resp 22  Ht 5\' 1"  (1.549 m)  Wt 175 lb (79.379 kg)  BMI 33.08 kg/m2  SpO2 95% Physical Exam  Nursing note and vitals reviewed. Constitutional: She is oriented to person, place, and time. She appears well-developed and well-nourished.  HENT:  Head: Normocephalic and atraumatic.  Eyes: EOM are normal. Pupils are equal, round, and reactive to light.  Neck: Neck supple.  Cardiovascular: Normal rate and regular rhythm.   Murmur heard. Pulmonary/Chest: Effort normal. No respiratory distress.  Abdominal: Soft. She exhibits no  distension. There is no tenderness. There is no rebound and no guarding.  Musculoskeletal:  Right femoral region shows ecchymoses, with no erythema or abscess. No bruit. Palpable pulse.  Neurological: She is alert and oriented to person, place, and time.  Skin: Skin is warm and dry.    ED Course  Procedures (including critical care time) Labs Review Labs Reviewed  CBC WITH DIFFERENTIAL - Abnormal; Notable for the following:    Hemoglobin 11.9 (*)    All other components within normal limits  COMPREHENSIVE METABOLIC PANEL - Abnormal; Notable for the following:    Potassium 3.4 (*)    Glucose, Bld 105 (*)    ALT 42 (*)    GFR calc non Af Amer 70 (*)    GFR calc Af Amer 82 (*)    All other components within normal limits  URINALYSIS, ROUTINE W REFLEX MICROSCOPIC - Abnormal; Notable for the following:    Leukocytes, UA SMALL (*)    All other components within normal limits  URINE CULTURE  TROPONIN I  PRO B NATRIURETIC PEPTIDE  URINE MICROSCOPIC-ADD ON    Imaging Review Dg Chest Port 1 View  09/23/2013   CLINICAL DATA:  Fatigue and chest pain.  EXAM: PORTABLE CHEST - 1 VIEW  COMPARISON:  Chest radiograph performed 04/28/2006  FINDINGS: The lungs are well-aerated and clear. There is no evidence of focal opacification, pleural effusion or pneumothorax.  The cardiomediastinal silhouette is borderline normal in size. No acute osseous abnormalities are seen.  IMPRESSION: No acute cardiopulmonary process seen.   Electronically Signed   By: Garald Balding M.D.   On: 09/23/2013 00:29     EKG Interpretation   Date/Time:  Thursday September 23 2013 00:41:38 EDT Ventricular Rate:  58 PR Interval:  157 QRS Duration: 111 QT Interval:  440 QTC Calculation: 432 R Axis:   0 Text Interpretation:  Sinus rhythm RSR' in V1 or V2, right VCD or RVH No  acute changes Confirmed by Kathrynn Humble, MD, Thelma Comp [phone]) on 09/23/2013  1:13:32 AM      MDM   Final diagnoses:  Generalized weakness  Aortic  stenosis    Pt comes in with generalized weakness and other symptoms. His of moderate to severe AS and a recent Cath. Sx started post cath.  No infection appreciated at the surgical site or per history and exam. Some of the sx could be related to her AS (dizziness, chest heaviness), and so we asked Cards to see her, and they have cleared her from Cards perspective. Will d.c PCP f/u advised, along with scheduled Cards f/u.    Varney Biles, MD 09/23/13 [phone]

## 2013-09-22 NOTE — ED Notes (Signed)
Pt very upset over wait for treatment room.  Explained triage process in detail to pt and apologized for wait.  Asked pt what I could do to make her wait better and she states she has not had anything to eat.  Offered to get pt something to eat and pt states she would like water.  Gave pt a cup of water and encouraged her to wait to stay.

## 2013-09-22 NOTE — ED Notes (Signed)
Dr. Kathrynn Humble in to see pt, at [facility].

## 2013-09-23 DIAGNOSIS — R079 Chest pain, unspecified: Secondary | ICD-10-CM

## 2013-09-23 DIAGNOSIS — I359 Nonrheumatic aortic valve disorder, unspecified: Secondary | ICD-10-CM

## 2013-09-23 LAB — URINE MICROSCOPIC-ADD ON

## 2013-09-23 LAB — URINALYSIS, ROUTINE W REFLEX MICROSCOPIC
BILIRUBIN URINE: NEGATIVE
Glucose, UA: NEGATIVE mg/dL
HGB URINE DIPSTICK: NEGATIVE
KETONES UR: NEGATIVE mg/dL
NITRITE: NEGATIVE
Protein, ur: NEGATIVE mg/dL
SPECIFIC GRAVITY, URINE: 1.011 (ref 1.005–1.030)
Urobilinogen, UA: 0.2 mg/dL (ref 0.0–1.0)
pH: 6 (ref 5.0–8.0)

## 2013-09-23 LAB — TROPONIN I

## 2013-09-23 LAB — PRO B NATRIURETIC PEPTIDE: Pro B Natriuretic peptide (BNP): 177 pg/mL (ref 0–450)

## 2013-09-23 NOTE — ED Notes (Signed)
The patient had no complaints with performing the orthostatics. The tech has reported to the RN in charge.

## 2013-09-23 NOTE — ED Notes (Signed)
Pt alert, NAD, calm, interactive, wants to go home, "feels better", recently to b/r, no urine sample obtained. Husband at [facility]. Continues to c/o HA. (denies: sob, other pain, nvd, dizziness, palpitations or other sx).

## 2013-09-23 NOTE — Discharge Instructions (Signed)
We saw you in the ER for the weakness. All the results in the ER are normal, labs and imaging. We are not sure what is causing your symptoms. The workup in the ER is not complete, and is limited to screening for life threatening and emergent conditions only, so please see a primary care doctor for further evaluation.   Aortic Stenosis Aortic stenosis is a narrowing of the aortic valve. The aortic valve is a gatelike structure that is located between the lower left chamber of the heart (left ventricle) and the blood vessel that leads away from the heart (aorta). When the aortic valve is narrowed, it does not open all the way. This makes it hard for the heart to pump blood into the aorta and causes the heart to work harder. The extra work can weaken the heart over time and lead to heart failure. CAUSES  Causes of aortic stenosis include:  Calcium deposits on the aortic valve that have made the valve stiff. This condition generally affects those over the age of 86. It is the most common cause of aortic stenosis.  A birth defect.  Rheumatic fever. This is a problem that may occur after a strep throat infection that was not treated adequately. Rheumatic fever can cause permanent damage to heart valves. SYMPTOMS  People with aortic stenosis usually have no symptoms until the condition becomes severe. It may take 10-20 years for mild or moderate aortic stenosis to become severe. Symptoms may include:   Shortness of breath, especially with physical activity.   Feeling weak and tired (fatigued) or getting tired easily.  Chest discomfort (angina). This may occur with minimal activity if the aortic stenosis is severe.  An irregular or faster-than-normal heartbeat.  Dizziness or fainting that happens with exertion or after taking certain heart medicines (such as nitroglycerin). DIAGNOSIS  Aortic stenosis is usually diagnosed with a physical exam and with a type of imaging test called  echocardiography. During echocardiography, sound waves are used to evaluate how blood flows through the heart. If your caregiver suspects aortic stenosis but the test does not clearly show it, a procedure called cardiac catheterization may be done to diagnose the condition. Tests may also be done to evaluate heart function. They may include:  Electrocardiography. During this test, the electrical impulses of the heart are recorded while you are lying down and sticky patches are placed on your chest, arms, and legs.  Stress tests. There is more than one type of stress test. If a stress test is needed, ask your caregiver about which type is best for you.  Blood tests. TREATMENT  Treatment depends on how severe the aortic stenosis is, your symptoms, and the problems it is causing.   Observation. If the aortic stenosis is mild, no treatment may be needed. However, you will need to have the condition checked regularly to make sure it is not getting worse or causing serious problems.  Surgery. Surgery to repair or replace the aortic valve is the most common treatment for aortic stenosis. Several types of surgeries are available. The most common are open-heart surgery and transcutaneous aortic valve replacement (TAVR). TAVR does not require that the chest be opened. It is usually performed on elderly patients and those who are not able to have open-heart surgery.  Medicines. Medicines may be given to keep symptoms from getting worse. Medicines cannot reverse aortic stenosis. HOME CARE INSTRUCTIONS   You may need to avoid certain types of physical activity. If your aortic stenosis is  mild, you may need to avoid only strenuous activity. The more severe your aortic stenosis, the more activities you will need to avoid. Talk with your caregiver about the types of activity you should avoid.  Take all medicines as directed by your caregiver.  If you are a woman with aortic valve stenosis and want to get  pregnant, talk to your caregiver before you become pregnant.  If you are a woman with aortic valve stenosis and are pregnant, keep all follow-up appointments with all recommended caregivers.  Keep all follow-up appointments for tests, exams, and treatments. SEEK IMMEDIATE MEDICAL CARE IF:  You develop chest pain or tightness.   You develop shortness of breath or difficulty breathing.   You develop lightheadedness or faint.   It feels like your heartbeat is irregular or faster than normal.  You have a fever or persistent symptoms for more than 2-3 days. Document Released: 12/22/2002 Document Revised: 07/20/2012 Document Reviewed: 03/19/2012 [facility] Patient Information 2015 [place], [place]. This information is not intended to replace advice given to you by your health care provider. Make sure you discuss any questions you have with your health care provider.  Fatigue Fatigue is a feeling of tiredness, lack of energy, lack of motivation, or feeling tired all the time. Having enough rest, good nutrition, and reducing stress will normally reduce fatigue. Consult your caregiver if it persists. The nature of your fatigue will help your caregiver to find out its cause. The treatment is based on the cause.  CAUSES  There are many causes for fatigue. Most of the time, fatigue can be traced to one or more of your habits or routines. Most causes fit into one or more of three general areas. They are: Lifestyle problems  Sleep disturbances.  Overwork.  Physical exertion.  Unhealthy habits.  Poor eating habits or eating disorders.  Alcohol and/or drug use .  Lack of proper nutrition (malnutrition). Psychological problems  Stress and/or anxiety problems.  Depression.  Grief.  Boredom. Medical Problems or Conditions  Anemia.  Pregnancy.  Thyroid gland problems.  Recovery from major surgery.  Continuous pain.  Emphysema or asthma that is not well controlled  Allergic  conditions.  Diabetes.  Infections (such as mononucleosis).  Obesity.  Sleep disorders, such as sleep apnea.  Heart failure or other heart-related problems.  Cancer.  Kidney disease.  Liver disease.  Effects of certain medicines such as antihistamines, cough and cold remedies, prescription pain medicines, heart and blood pressure medicines, drugs used for treatment of cancer, and some antidepressants. SYMPTOMS  The symptoms of fatigue include:   Lack of energy.  Lack of drive (motivation).  Drowsiness.  Feeling of indifference to the surroundings. DIAGNOSIS  The details of how you feel help guide your caregiver in finding out what is causing the fatigue. You will be asked about your present and past health condition. It is important to review all medicines that you take, including prescription and non-prescription items. A thorough exam will be done. You will be questioned about your feelings, habits, and normal lifestyle. Your caregiver may suggest blood tests, urine tests, or other tests to look for common medical causes of fatigue.  TREATMENT  Fatigue is treated by correcting the underlying cause. For example, if you have continuous pain or depression, treating these causes will improve how you feel. Similarly, adjusting the dose of certain medicines will help in reducing fatigue.  HOME CARE INSTRUCTIONS   Try to get the required amount of good sleep every night.  Eat a healthy and nutritious diet, and drink enough water throughout the day.  Practice ways of relaxing (including yoga or meditation).  Exercise regularly.  Make plans to change situations that cause stress. Act on those plans so that stresses decrease over time. Keep your work and personal routine reasonable.  Avoid street drugs and minimize use of alcohol.  Start taking a daily multivitamin after consulting your caregiver. SEEK MEDICAL CARE IF:   You have persistent tiredness, which cannot be  accounted for.  You have fever.  You have unintentional weight loss.  You have headaches.  You have disturbed sleep throughout the night.  You are feeling sad.  You have constipation.  You have dry skin.  You have gained weight.  You are taking any new or different medicines that you suspect are causing fatigue.  You are unable to sleep at night.  You develop any unusual swelling of your legs or other parts of your body. SEEK IMMEDIATE MEDICAL CARE IF:   You are feeling confused.  Your vision is blurred.  You feel faint or pass out.  You develop severe headache.  You develop severe abdominal, pelvic, or back pain.  You develop chest pain, shortness of breath, or an irregular or fast heartbeat.  You are unable to pass a normal amount of urine.  You develop abnormal bleeding such as bleeding from the rectum or you vomit blood.  You have thoughts about harming yourself or committing suicide.  You are worried that you might harm someone else. MAKE SURE YOU:   Understand these instructions.  Will watch your condition.  Will get help right away if you are not doing well or get worse. Document Released: 01/20/2007 Document Revised: 06/17/2011 Document Reviewed: 01/20/2007 [facility] Patient Information 2015 [place], [place]. This information is not intended to replace advice given to you by your health care provider. Make sure you discuss any questions you have with your health care provider.

## 2013-09-23 NOTE — ED Notes (Signed)
Confirmed with lab working with urine to obtain UA

## 2013-09-23 NOTE — ED Notes (Addendum)
Denies needs sx questions or concerns unmet, "ready to go", "feel better". Steady gait. Out with husband.

## 2013-09-23 NOTE — Telephone Encounter (Signed)
Follow up     Pt want to talk to Dr Radford Pax only------pt would not tell me what she wanted.

## 2013-09-23 NOTE — ED Notes (Addendum)
Card MD DR. Whitlock at [facility]

## 2013-09-23 NOTE — ED Notes (Signed)
Ambulatory with supervision, "feels OK up & walking", steady gait. alert,NAD, calm, interactive.

## 2013-09-23 NOTE — ED Notes (Signed)
Pt ambulatory to b/r, steady gait, assisted by EMT.

## 2013-09-23 NOTE — Telephone Encounter (Signed)
To Dr Radford Pax. Pt only wanted to speak to you.[name]

## 2013-09-23 NOTE — ED Notes (Signed)
The patient was assisted to the restroom and had no complaints of dizziness or weakness. The RN is aware.

## 2013-09-23 NOTE — Consult Note (Signed)
CARDIOLOGY CONSULT NOTE  Patient ID: KYLYNN STREET, MRN: [id number], DOB/AGE: 75-Jul-1942 75 y.o. Admit date: 09/22/2013 Date of Consult: 09/23/2013  Primary Physician: Horton Finer, MD Primary Cardiologist: Radford Pax  Chief Complaint: several concerns including cath site bruising, chest pain, fatigue Reason for Consultation: known severe AS  HPI: 73 y.o. female w/ PMHx significant for severe AS, HTN who presented to [facility] on 09/23/2013 with complaints of chest pain, fatigue, intermittent abdominal swelling and cath site bruising. Recently has undergone work up for aortic stenosis. Underwent L and R cath 1 week ago and since then she has describes the problems above. The chest pain is fleeting, only lasting a second or two. Abdominal swelling improves with lying down. Feels fatigued as well. Is anxious about the need for surgery. R groin is bruised but no pain.   Past Medical History  Diagnosis Date  . Obesity   . Abnormal vaginal Pap smear 1994    annual paps for years after that.more recently every other year,last in 2012-we agreed not to do them anymore  . Hypercholesteremia   . Osteopenia     declines treatment  . Aortic stenosis   . Pneumonia 1995  . Vitamin D insufficiency   . Shoulder pain     Due to arthritis  . Hypertension   . Coronary artery disease 2008    nonobstructive ASCAD with 30% LAD      Surgical History:  Past Surgical History  Procedure Laterality Date  . Cholecystectomy    . Cardiac catheterization       Home Meds: Prior to Admission medications   Medication Sig Start Date End Date Taking? Authorizing Provider  aspirin 81 MG tablet Take 81 mg by mouth every other day.    Yes Historical Provider, MD  Cholecalciferol (VITAMIN D) 2000 UNITS tablet Take 2,000 Units by mouth every other day.   Yes Historical Provider, MD  hydrochlorothiazide (HYDRODIURIL) 25 MG tablet Take 25 mg by mouth daily.   Yes Historical Provider, MD  lisinopril  (PRINIVIL,ZESTRIL) 10 MG tablet Take 10 mg by mouth daily.   Yes Historical Provider, MD    Inpatient Medications:    Allergies:  Allergies  Allergen Reactions  . Codeine   . Lipitor [Atorvastatin]     Stomach pain  . Penicillins   . Pravastatin     Myalgias   . Simvastatin     Myalgias   . Sulfa Antibiotics     History   Social History  . Marital Status: Married    Spouse Name: N/A    Number of Children: N/A  . Years of Education: N/A   Occupational History  . Not on file.   Social History Main Topics  . Smoking status: Never Smoker   . Smokeless tobacco: Not on file  . Alcohol Use: No  . Drug Use: No  . Sexual Activity: Not on file   Other Topics Concern  . Not on file   Social History Narrative  . No narrative on file     Family History  Problem Relation Age of Onset  . Dementia Mother   . Heart disease Father   . Heart attack Father   . Heart disease Brother   . Heart attack Brother   . Arrhythmia Sister      Review of Systems: General: negative for chills, fever, night sweats or weight changes.  Cardiovascular: see HPI Dermatological: negative for rash Respiratory: negative for cough or wheezing Urologic: negative for hematuria Abdominal:  negative for nausea, vomiting, diarrhea, bright red blood per rectum, melena, or hematemesis Neurologic: negative for visual changes, syncope, or dizziness All other systems reviewed and are otherwise negative except as noted above.  Labs:  Recent Labs  09/22/13 2350  TROPONINI <0.30   Lab Results  Component Value Date   WBC 8.8 09/22/2013   HGB 11.9* 09/22/2013   HCT 36.5 09/22/2013   MCV 90.6 09/22/2013   PLT 282 09/22/2013    Recent Labs Lab 09/22/13 2148  NA 139  K 3.4*  CL 98  CO2 26  BUN 17  CREATININE 0.80  CALCIUM 9.1  PROT 7.0  BILITOT 0.3  ALKPHOS 90  ALT 42*  AST 30  GLUCOSE 105*   No results found for this basename: CHOL, HDL, LDLCALC, TRIG   No results found for this  basename: DDIMER    Radiology/Studies:  Dg Chest Port 1 View  09/23/2013   CLINICAL DATA:  Fatigue and chest pain.  EXAM: PORTABLE CHEST - 1 VIEW  COMPARISON:  Chest radiograph performed 04/28/2006  FINDINGS: The lungs are well-aerated and clear. There is no evidence of focal opacification, pleural effusion or pneumothorax.  The cardiomediastinal silhouette is borderline normal in size. No acute osseous abnormalities are seen.  IMPRESSION: No acute cardiopulmonary process seen.   Electronically Signed   By: Garald Balding M.D.   On: 09/23/2013 00:29    EKG: sinus, normal   Physical Exam: Blood pressure 120/46, pulse 52, temperature 98.9 F (37.2 C), temperature source Oral, resp. rate 22, height 5\' 1"  (1.549 m), weight 79.379 kg (175 lb), SpO2 95.00%. General: Well developed, well nourished, in no acute distress. Head: Normocephalic, atraumatic, sclera non-icteric, no xanthomas, nares are without discharge.  Neck: Supple. Negative for carotid bruits. JVD not elevated. Lungs: Clear bilaterally to auscultation without wheezes, rales, or rhonchi. Breathing is unlabored. Heart: RRR, 4/6 mid peaking SEM at RUSB Abdomen: Soft, non-tender, non-distended with normoactive bowel sounds. No hepatomegaly. No rebound/guarding. No obvious abdominal masses. Msk:  Strength and tone appear normal for age. Extremities: No clubbing or cyanosis. No edema.  Distal pedal pulses are 2+ and equal bilaterally. R groin with fading bruises Neuro: Alert and oriented X 3. Moves all extremities spontaneously. Psych:  Responds to questions appropriately with a normal affect.   Assessment and Plan:  1. Atypical chest pain 2. Normal post cath bruising 3. Severe aortic stenosis, symptoms include fatigue, lightheadedness 4. HTN 5. Recent cath  73 y.o. female w/ PMHx significant for severe AS, HTN who presented to [facility] on 09/23/2013 with complaints of chest pain, fatigue, intermittent abdominal swelling and  cath site bruising.  Encouragingly, her bloodwork and ekg are normal. Furthermore, her symptoms of chest pain and abdominal swelling, though concerning if felt to be due to her severe AS, do not appear to be as such. Her cath site is well healed. No evidence of adominal abnormalities. No evidence of heart failure. No syncope.  No indication to admit. Safe to discharge from cardiac perspective. Patient and husband agreeable to plan and thankful for the reassurance. Has appointment scheduled with CT surgery already.  Thank you for this consult. Please call with questions.  Signed, Elias Else, MATTHEW C. MD 09/23/2013, 3:35 AM

## 2013-09-24 LAB — URINE CULTURE
COLONY COUNT: NO GROWTH
CULTURE: NO GROWTH

## 2013-09-24 NOTE — Telephone Encounter (Signed)
I spoke with the patient and she says that her SOB and chest pain have been getting worse since the heart cath.  She is very concerned. I have recommended that she come into [facility] to be admitted.  She says that she cannot come yet because her husband is not home and he will not be home until around 6pm.  She agrees to come in to the ER as soon as her husband can bring her.  She will be admitted and CVTS consulted for symptomatic severe AS.  She had an appt scheduled with Dr. Cyndia Bent next Friday but says that she feel so bad that she is worried she will not make it until then. She agrees to come to the ER.

## 2013-09-29 ENCOUNTER — Encounter: Payer: Medicare PPO | Admitting: Surgery

## 2013-10-01 ENCOUNTER — Institutional Professional Consult (permissible substitution) (INDEPENDENT_AMBULATORY_CARE_PROVIDER_SITE_OTHER): Payer: Medicare PPO | Admitting: Surgery

## 2013-10-01 ENCOUNTER — Other Ambulatory Visit: Payer: Self-pay

## 2013-10-01 ENCOUNTER — Encounter: Payer: Self-pay | Admitting: Surgery

## 2013-10-01 VITALS — BP 136/63 | HR 60 | Resp 20 | Ht 61.0 in | Wt 175.0 lb

## 2013-10-01 DIAGNOSIS — I350 Nonrheumatic aortic (valve) stenosis: Secondary | ICD-10-CM

## 2013-10-01 DIAGNOSIS — I359 Nonrheumatic aortic valve disorder, unspecified: Secondary | ICD-10-CM

## 2013-10-01 NOTE — Progress Notes (Signed)
Cardiothoracic Surgery Consultation   PCP is Horton Finer, MD Referring Provider is Sueanne Margarita, MD  Chief Complaint  Patient presents with  . Aortic Stenosis    Surgical eval,  2D Echo 09/03/13, Cardiac Cath 09/16/13- Dr Radford Pax    HPI:  The patient is a 73 year old woman with a history of hypertension, hypercholesterolemia, and aortic stenosis that was moderate by echo in 2008 with no further evaluation since then. She recently presented to Dr. [name] with a 6 month history of shortness of breath with exertion. A 2D echo on 09/03/2013 showed severe AS with a mean gradient of 74 and a peak of 121 with an AVA of 0.83 cm2. LVEF was 60-65% with mild LVH, trivial MR. She underwent cardiac cath showing normal coronary arteries and mild pulmonary HTN. Since the heart cath she says that she has felt differently with more shortness of breath and chest pain. She was seen in the ER on 09/22/2013 and was not felt to have any acute cardiac problem and and was sent home.    Past Medical History  Diagnosis Date  . Obesity   . Abnormal vaginal Pap smear 1994    annual paps for years after that.more recently every other year,last in 2012-we agreed not to do them anymore  . Hypercholesteremia   . Osteopenia     declines treatment  . Aortic stenosis   . Pneumonia 1995  . Vitamin D insufficiency   . Shoulder pain     Due to arthritis  . Hypertension   . Coronary artery disease 2008    nonobstructive ASCAD with 30% LAD    Past Surgical History  Procedure Laterality Date  . Cholecystectomy    . Cardiac catheterization      Family History  Problem Relation Age of Onset  . Dementia Mother   . Heart disease Father   . Heart attack Father   . Heart disease Brother   . Heart attack Brother   . Arrhythmia Sister     Social History History  Substance Use Topics  . Smoking status: Never Smoker   . Smokeless tobacco: Not on file  . Alcohol Use: No    Current Outpatient  Prescriptions  Medication Sig Dispense Refill  . aspirin 81 MG tablet Take 81 mg by mouth every other day.       . Cholecalciferol (VITAMIN D) 2000 UNITS tablet Take 2,000 Units by mouth every other day.      . hydrochlorothiazide (HYDRODIURIL) 25 MG tablet Take 25 mg by mouth daily.      Marland Kitchen lisinopril (PRINIVIL,ZESTRIL) 10 MG tablet Take 10 mg by mouth daily.       No current facility-administered medications for this visit.    Allergies  Allergen Reactions  . Codeine   . Lipitor [Atorvastatin]     Stomach pain  . Penicillins   . Pravastatin     Myalgias   . Simvastatin     Myalgias   . Sulfa Antibiotics     Review of Systems  Constitutional: Positive for fatigue. Negative for fever, chills and activity change.  HENT: Negative.        Wears dentures  Eyes: Negative.   Respiratory: Positive for shortness of breath.   Cardiovascular: Negative for chest pain, palpitations and leg swelling.  Gastrointestinal: Negative.   Endocrine: Negative.   Genitourinary: Negative.   Musculoskeletal: Negative.   Allergic/Immunologic: Negative.   Neurological: Negative.   Hematological: Negative.   Psychiatric/Behavioral: The  patient is nervous/anxious.     BP 136/63  Pulse 60  Resp 20  Ht 5\' 1"  (1.549 m)  Wt 175 lb (79.379 kg)  BMI 33.08 kg/m2  SpO2 97% Physical Exam  Constitutional: She is oriented to person, place, and time. She appears well-developed and well-nourished. No distress.  HENT:  Head: Atraumatic.  Mouth/Throat: Oropharynx is clear and moist.  Eyes: EOM are normal. Pupils are equal, round, and reactive to light.  Neck: Normal range of motion. Neck supple. No JVD present. No tracheal deviation present. No thyromegaly present.  Cardiovascular: Normal rate, regular rhythm and intact distal pulses.   Murmur heard. Harsh 3/6 systolic murmur over RUSB  Pulmonary/Chest: Effort normal and breath sounds normal. No respiratory distress. She has no rales.  Abdominal:  Bowel sounds are normal. She exhibits no distension and no mass. There is no tenderness.  Musculoskeletal: Normal range of motion.  Lymphadenopathy:    She has no cervical adenopathy.  Neurological: She is alert and oriented to person, place, and time. She has normal strength. No cranial nerve deficit or sensory deficit.  Skin: Skin is warm and dry.  Psychiatric: She has a normal mood and affect.    Diagnostic Tests: [facility] [address], [place] [address] [phone]  ------------------------------------------------------------------- Transthoracic Echocardiography  Patient: Carollee, Nussbaumer MR #: [id number] Study Date: 09/03/2013 Gender: F Age: 49 Height: 154.9 cm Weight: 80.3 kg BSA: 1.89 m^2 Pt. Status: Room:  ATTENDING Fransico Him, MD ORDERING Fransico Him, MD REFERRING Fransico Him, MD SONOGRAPHER Cindy Hazy, RDCS PERFORMING [facility], Outpatient  cc:  ------------------------------------------------------------------- LV EF: 60% - 65%  ------------------------------------------------------------------- Indications: 424.1 Aortic valve disorders.  ------------------------------------------------------------------- History: PMH: CAD. Shortness of Breath. Pneumonia. Risk factors: Hypertension. Hypercholesterolemia.  ------------------------------------------------------------------- Study Conclusions  - Left ventricle: The cavity size was normal. Wall thickness was increased in a pattern of mild LVH. Systolic function was normal. The estimated ejection fraction was in the range of 60% to 65%. Wall motion was normal; there were no regional wall motion abnormalities. Doppler parameters are consistent with abnormal left ventricular relaxation (grade 1 diastolic dysfunction). - Aortic valve: Probably trileaflet; severely calcified leaflets. There was severe stenosis. There was trivial regurgitation. Mean gradient (S): 74 mm Hg. Peak  gradient (S): 121 mm Hg. Valve area (VTI): 0.83 cm^2. - Mitral valve: Mildly calcified annulus. There was trivial regurgitation. - Left atrium: The atrium was mildly dilated. - Right ventricle: The cavity size was normal. Systolic function was normal. - Tricuspid valve: Peak RV-RA gradient (S): 29 mm Hg. - Pulmonary arteries: PA peak pressure: 32 mm Hg (S). - Inferior vena cava: The vessel was normal in size. The respirophasic diameter changes were in the normal range (= 50%), consistent with normal central venous pressure. - Pericardium, extracardiac: A trivial pericardial effusion was identified.  Impressions:  - Normal LV size with mild LV hypertrophy. EF 60-65%. Normal RV size and systolic function. Severe aortic stenosis with trivial aortic insufficiency.  -------------------------------------------------------------------  ------------------------------------------------------------------- Left ventricle: The cavity size was normal. Wall thickness was increased in a pattern of mild LVH. Systolic function was normal. The estimated ejection fraction was in the range of 60% to 65%. Wall motion was normal; there were no regional wall motion abnormalities. Doppler parameters are consistent with abnormal left ventricular relaxation (grade 1 diastolic dysfunction).  ------------------------------------------------------------------- Aortic valve: Probably trileaflet; severely calcified leaflets. Doppler: There was severe stenosis. There was trivial regurgitation. VTI ratio of LVOT to aortic valve: 0.22. Valve area (VTI):  0.83 cm^2. Indexed valve area (VTI): 0.44 cm^2/m^2. Valve area (Vmax): 0.85 cm^2. Indexed valve area (Vmax): 0.45 cm^2/m^2. Mean gradient (S): 74 mm Hg. Peak gradient (S): 121 mm Hg.  ------------------------------------------------------------------- Aorta: Aortic root: The aortic root was normal in size. Ascending aorta: The ascending aorta was normal in  size.  ------------------------------------------------------------------- Mitral valve: Mildly calcified annulus. Doppler: There was no evidence for stenosis. There was trivial regurgitation. Peak gradient (D): 3 mm Hg.  ------------------------------------------------------------------- Left atrium: The atrium was mildly dilated.  ------------------------------------------------------------------- Right ventricle: The cavity size was normal. Systolic function was normal.  ------------------------------------------------------------------- Pulmonic valve: Structurally normal valve. Cusp separation was normal. Doppler: Transvalvular velocity was within the normal range. There was no regurgitation.  ------------------------------------------------------------------- Tricuspid valve: Doppler: There was trivial regurgitation.  ------------------------------------------------------------------- Right atrium: The atrium was normal in size.  ------------------------------------------------------------------- Pericardium: A trivial pericardial effusion was identified.  ------------------------------------------------------------------- Systemic veins: Inferior vena cava: The vessel was normal in size. The respirophasic diameter changes were in the normal range (= 50%), consistent with normal central venous pressure.  ------------------------------------------------------------------- Prepared and Electronically Authenticated by  Loralie Champagne, M.D. 2015-05-29T17:50:02  ------------------------------------------------------------------- Measurements  Left ventricle Value Reference LV ID, ED, PLAX chordal (N) 44 mm 43 - 52 LV ID, ES, PLAX chordal (N) 23 mm 23 - 38 LV fx shortening, PLAX chordal (N) 48 % >=29 LV PW thickness, ED 13.6 mm --------- IVS/LV PW ratio, ED (N) 0.96 <=1.3 Stroke volume, 2D 118 ml --------- Stroke volume/bsa, 2D 62 ml/m^2 --------- LV e&', lateral  7.46 cm/s --------- LV E/e&', lateral 12.49 --------- LV e&', medial 7.02 cm/s --------- LV E/e&', medial 13.28 --------- LV e&', average 7.24 cm/s --------- LV E/e&', average 12.87 ---------  Ventricular septum Value Reference IVS thickness, ED 13.1 mm ---------  LVOT Value Reference LVOT ID, S 22 mm --------- LVOT area 3.8 cm^2 --------- LVOT VTI, S 31.1 cm ---------  Aortic valve Value Reference Aortic valve peak velocity, S 549 cm/s --------- Aortic valve mean velocity, S 392 cm/s --------- Aortic valve VTI, S 143 cm --------- Aortic mean gradient, S 74 mm Hg --------- Aortic peak gradient, S 121 mm Hg --------- VTI ratio, LVOT/AV 0.22 --------- Aortic valve area, VTI 0.83 cm^2 --------- Aortic valve area/bsa, VTI 0.44 cm^2/m^2 --------- Aortic valve area, peak velocity 0.85 cm^2 --------- Aortic valve area/bsa, peak 0.45 cm^2/m^2 --------- velocity Aortic regurg pressure half-time 856 ms ---------  Aorta Value Reference Aortic root ID, ED 33 mm ---------  Left atrium Value Reference LA ID, A-P, ES 40 mm --------- LA ID/bsa, A-P (N) 2.11 cm/m^2 <=2.2  Mitral valve Value Reference Mitral E-wave peak velocity 93.2 cm/s --------- Mitral A-wave peak velocity 114 cm/s --------- Mitral deceleration time (H) 250 ms 150 - 230 Mitral peak gradient, D 3 mm Hg --------- Mitral E/A ratio, peak 0.8 ---------  Pulmonary arteries Value Reference PA pressure, S, DP (H) 32 mm Hg <=30  Tricuspid valve Value Reference Tricuspid regurg peak velocity 267 cm/s --------- Tricuspid peak RV-RA gradient 29 mm Hg --------- Tricuspid maximal regurg 267 cm/s --------- velocity, PISA  Right ventricle Value Reference RV s&', lateral, S 19.9 cm/s ---------  Legend: (L) and (H) mark values outside specified reference range.  (N) marks values inside specified reference range.   Cardiac Cath Procedure Note  Indication: SOB with severe AS by echo  Procedures performed:  1) Right heart  cathererization  2) Selective coronary angiography  3) Left heart catheterization  4) Left ventriculogram  Description of procedure:   The risks and  indication of the procedure were explained. Consent was signed and placed on the chart. An appropriate timeout was taken prior to the procedure. The right groin was prepped and draped in the routine sterile fashion and anesthetized with 1% local lidocaine.  A 5 FR arterial sheath was placed in the right femoral artery using a modified Seldinger technique. Standard catheters including a JL4, JR4 and angled pigtail were used. All catheter exchanges were made over a wire. A 7 FR venous sheath was placed in the right femoral vein using a modified Seldinger technique. A standard Swan-Ganz catheter was used for the procedure.  Complications: None apparent  Findings:  RA = 12/11 with mean 76mmHg  RV = 42/10 with mean 51mmHg  PA = 39/19 with mean 64mmHg  PCW = 27/22 with mean 37mmHg  Fick cardiac output/index =5.15/2.88  PVR =116D/S  FA sat = 94%  PA sat = 70%  The catheter was not placed across the AV due to very severe AS documented at echo.  Left main:widely patent and bifurcates into an LAD and left circumflex arteries  LAD: Widely patent throughout. It gives rise to a small first diagonal which is patent. It then gives rise in the mid portion to a second moderate sized diagonal #2 which is patent and then a 3rd diagonal which is small and patent. The ongoing LAD traverses to the apex and is patent.  LCX: Widely patent throughout its course in the AV groove. It gives rise to a moderate sized OM#1 which is patent and then gives rise to a large OM#2 which is widely patent. The ongoing left circuflex tapers into a small vessel that is patent.  JHE:RDEYCX patent throughout its course. It bifurcates distally into a PDA and PL branches which are patent.  LV-gram not done due to severe AS  Assessment:  1. Normal coronary arteries  2. Normal LVF by Echo    3. Severe AS by echo with mean AVG 21mmHg  4. 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 Elevated PCW secondary to #4  Plan/Discussion:  1. D/C home when bedrest completed  2. Outpt consult with Dr. Prescott Gum for AVR  3. Followup with Richardson Dopp PA-C in 2 weeks  Sueanne Margarita, MD  10:58 AM      Impression:  She has severe, symptomatic aortic stenosis that requires AVR for relief of symptoms and improvement in long-term prognosis, avoidance of LV dysfunction and death. She had questions about TAVR and I explained to her and her family why she is not a candidate for that procedure. I discussed the operative procedure with the patient and family including alternatives, benefits and risks; including but not limited to bleeding, blood transfusion, infection, stroke, myocardial infarction, graft failure, heart block requiring a permanent pacemaker, organ dysfunction, and death.  [name] understands and agrees to proceed.    Plan:  AVR using a tissue valve on 10/14/2013  I spent 80 minutes performing this consultation and > 50% of this time was spent face to face counseling and coordinating the care of this patient's severe, symptomatic aortic stenosis.

## 2013-10-06 ENCOUNTER — Encounter (HOSPITAL_COMMUNITY): Payer: Self-pay | Admitting: Pharmacy Technician

## 2013-10-12 ENCOUNTER — Ambulatory Visit (HOSPITAL_COMMUNITY)
Admission: RE | Admit: 2013-10-12 | Discharge: 2013-10-12 | Disposition: A | Discharge status: Home / Self Care | Payer: Medicare PPO | Source: Ambulatory Visit | Attending: Surgery | Admitting: Surgery

## 2013-10-12 ENCOUNTER — Encounter (HOSPITAL_COMMUNITY): Payer: Self-pay

## 2013-10-12 ENCOUNTER — Encounter (HOSPITAL_COMMUNITY)
Admission: RE | Admit: 2013-10-12 | Discharge: 2013-10-12 | Disposition: A | Discharge status: Home / Self Care | Payer: Medicare PPO | Source: Ambulatory Visit | Attending: Surgery | Admitting: Surgery

## 2013-10-12 VITALS — BP 129/58 | HR 52 | Temp 98.5°F | Resp 20 | Ht 61.0 in | Wt 169.3 lb

## 2013-10-12 DIAGNOSIS — Z01810 Encounter for preprocedural cardiovascular examination: Secondary | ICD-10-CM | POA: Insufficient documentation

## 2013-10-12 DIAGNOSIS — I2510 Atherosclerotic heart disease of native coronary artery without angina pectoris: Secondary | ICD-10-CM | POA: Insufficient documentation

## 2013-10-12 DIAGNOSIS — I359 Nonrheumatic aortic valve disorder, unspecified: Secondary | ICD-10-CM

## 2013-10-12 DIAGNOSIS — I6529 Occlusion and stenosis of unspecified carotid artery: Secondary | ICD-10-CM | POA: Insufficient documentation

## 2013-10-12 DIAGNOSIS — I771 Stricture of artery: Secondary | ICD-10-CM

## 2013-10-12 HISTORY — DX: Anxiety disorder, unspecified: F41.9

## 2013-10-12 HISTORY — DX: Cardiac murmur, unspecified: R01.1

## 2013-10-12 LAB — PULMONARY FUNCTION TEST
DL/VA % PRED: 166 %
DL/VA: 7.34 ml/min/mmHg/L
DLCO COR % PRED: 147 %
DLCO cor: 29.78 ml/min/mmHg
DLCO unc % pred: 147 %
DLCO unc: 29.78 ml/min/mmHg
FEF 25-75 POST: 1.1 L/s
FEF 25-75 Pre: 1.36 L/sec
FEF2575-%CHANGE-POST: -18 %
FEF2575-%PRED-POST: 75 %
FEF2575-%PRED-PRE: 92 %
FEV1-%Change-Post: -3 %
FEV1-%PRED-PRE: 104 %
FEV1-%Pred-Post: 100 %
FEV1-POST: 1.82 L
FEV1-Pre: 1.89 L
FEV1FVC-%CHANGE-POST: 0 %
FEV1FVC-%Pred-Pre: 100 %
FEV6-%Change-Post: -5 %
FEV6-%Pred-Post: 101 %
FEV6-%Pred-Pre: 107 %
FEV6-Post: 2.35 L
FEV6-Pre: 2.49 L
FEV6FVC-%Change-Post: 0 %
FEV6FVC-%Pred-Post: 103 %
FEV6FVC-%Pred-Pre: 104 %
FVC-%Change-Post: -4 %
FVC-%PRED-POST: 98 %
FVC-%Pred-Pre: 102 %
FVC-Post: 2.39 L
FVC-Pre: 2.51 L
POST FEV1/FVC RATIO: 76 %
PRE FEV1/FVC RATIO: 75 %
Post FEV6/FVC ratio: 98 %
Pre FEV6/FVC Ratio: 99 %
RV % pred: 87 %
RV: 1.89 L
TLC % PRED: 98 %
TLC: 4.51 L

## 2013-10-12 LAB — COMPREHENSIVE METABOLIC PANEL
ALBUMIN: 4.1 g/dL (ref 3.5–5.2)
ALT: 28 U/L (ref 0–35)
AST: 22 U/L (ref 0–37)
Alkaline Phosphatase: 75 U/L (ref 39–117)
Anion gap: 17 — ABNORMAL HIGH (ref 5–15)
BUN: 15 mg/dL (ref 6–23)
CALCIUM: 9.7 mg/dL (ref 8.4–10.5)
CO2: 23 mEq/L (ref 19–32)
CREATININE: 0.71 mg/dL (ref 0.50–1.10)
Chloride: 100 mEq/L (ref 96–112)
GFR calc Af Amer: >90 mL/min (ref >90)
GFR calc non Af Amer: 82 mL/min — ABNORMAL LOW (ref >90)
Glucose, Bld: 104 mg/dL — ABNORMAL HIGH (ref 70–99)
Potassium: 3.7 mEq/L (ref 3.7–5.3)
Sodium: 140 mEq/L (ref 137–147)
Total Bilirubin: 0.3 mg/dL (ref 0.3–1.2)
Total Protein: 7.1 g/dL (ref 6.0–8.3)

## 2013-10-12 LAB — BLOOD GAS, ARTERIAL
ACID-BASE EXCESS: 3.3 mmol/L — AB (ref 0.0–2.0)
Bicarbonate: 26.5 mEq/L — ABNORMAL HIGH (ref 20.0–24.0)
DRAWN BY: 344381
FIO2: 0.21 %
O2 SAT: 97.2 %
PCO2 ART: 34 mmHg — AB (ref 35.0–45.0)
Patient temperature: 98.6
TCO2: 27.5 mmol/L (ref 0–100)
pH, Arterial: 7.503 — ABNORMAL HIGH (ref 7.350–7.450)
pO2, Arterial: 82.9 mmHg (ref 80.0–100.0)

## 2013-10-12 LAB — ABO/RH: ABO/RH(D): O POS

## 2013-10-12 LAB — URINE MICROSCOPIC-ADD ON

## 2013-10-12 LAB — CBC
HCT: 40.3 % (ref 36.0–46.0)
Hemoglobin: 13.3 g/dL (ref 12.0–15.0)
MCH: 29.6 pg (ref 26.0–34.0)
MCHC: 33 g/dL (ref 30.0–36.0)
MCV: 89.6 fL (ref 78.0–100.0)
PLATELETS: 232 10*3/uL (ref 150–400)
RBC: 4.5 MIL/uL (ref 3.87–5.11)
RDW: 13.3 % (ref 11.5–15.5)
WBC: 9.6 10*3/uL (ref 4.0–10.5)

## 2013-10-12 LAB — URINALYSIS, ROUTINE W REFLEX MICROSCOPIC
BILIRUBIN URINE: NEGATIVE
Glucose, UA: NEGATIVE mg/dL
Hgb urine dipstick: NEGATIVE
Ketones, ur: NEGATIVE mg/dL
NITRITE: NEGATIVE
Protein, ur: NEGATIVE mg/dL
SPECIFIC GRAVITY, URINE: 1.011 (ref 1.005–1.030)
UROBILINOGEN UA: 0.2 mg/dL (ref 0.0–1.0)
pH: 7 (ref 5.0–8.0)

## 2013-10-12 LAB — PROTIME-INR
INR: 0.95 (ref 0.00–1.49)
Prothrombin Time: 12.7 seconds (ref 11.6–15.2)

## 2013-10-12 LAB — APTT: APTT: 28 s (ref 24–37)

## 2013-10-12 LAB — HEMOGLOBIN A1C
HEMOGLOBIN A1C: 5.9 % — AB (ref <5.7)
Mean Plasma Glucose: 123 mg/dL — ABNORMAL HIGH (ref <117)

## 2013-10-12 LAB — SURGICAL PCR SCREEN
MRSA, PCR: NEGATIVE
STAPHYLOCOCCUS AUREUS: NEGATIVE

## 2013-10-12 MED ORDER — ALBUTEROL SULFATE (2.5 MG/3ML) 0.083% IN NEBU
2.5000 mg | INHALATION_SOLUTION | Freq: Once | RESPIRATORY_TRACT | Status: AC
  Administered 2013-10-12: 2.5 mg via RESPIRATORY_TRACT

## 2013-10-12 NOTE — Progress Notes (Signed)
VASCULAR LAB PRELIMINARY  PRELIMINARY  PRELIMINARY  PRELIMINARY  Pre-op Cardiac Surgery  Carotid Findings:  Bilateral:  Vertebral artery flow is antegrade.  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  Left:  1-39% ICA stenosis.  Upper Extremity Right Left  Brachial Pressures 159 triphasic 151 triphasic  Radial Waveforms triphasic triphasic  Ulnar Waveforms triphasic triphasic  Palmar Arch (Allen's Test) WNL WNL   Findings:  Doppler waveforms remain normal with ulnar and radial compressions bilaterally.   Janssen Zee, RVT 10/12/2013, 12:28 PM

## 2013-10-12 NOTE — Pre-Procedure Instructions (Addendum)
FAJR FIFE  10/12/2013   Your procedure is scheduled on:  10/14/13  Report to [facility] cone short stay admitting at 530 AM.  Call this number if you have problems the morning of surgery: [phone]   Remember:   Do not eat food or drink liquids after midnight.   Take these medicines the morning of surgery with A SIP OF WATER:        Take all meds as ordered until day of surgery except as instructed below or per dr        Bridgette Habermann all herbel meds, nsaids (aleve,naproxen,advil,ibuprofen) now including vitamins   Do not wear jewelry, make-up or nail polish.  Do not wear lotions, powders, or perfumes. You may wear deodorant.  Do not shave 48 hours prior to surgery. Men may shave face and neck.  Do not bring valuables to the hospital.  [facility] is not responsible                  for any belongings or valuables.               Contacts, dentures or bridgework may not be worn into surgery.  Leave suitcase in the car. After surgery it may be brought to your room.  For patients admitted to the hospital, discharge time is determined by your                treatment team.               Patients discharged the day of surgery will not be allowed to drive  home.  Name and phone number of your driver:   Special Instructions:  Special Instructions: [place] - Preparing for Surgery  Before surgery, you can play an important role.  Because skin is not sterile, your skin needs to be as free of germs as possible.  You can reduce the number of germs on you skin by washing with CHG (chlorahexidine gluconate) soap before surgery.  CHG is an antiseptic cleaner which kills germs and bonds with the skin to continue killing germs even after washing.  Please DO NOT use if you have an allergy to CHG or antibacterial soaps.  If your skin becomes reddened/irritated stop using the CHG and inform your nurse when you arrive at Short Stay.  Do not shave (including legs and underarms) for at least 48 hours prior to the  first CHG shower.  You may shave your face.  Please follow these instructions carefully:   1.  Shower with CHG Soap the night before surgery and the morning of Surgery.  2.  If you choose to wash your hair, wash your hair first as usual with your normal shampoo.  3.  After you shampoo, rinse your hair and body thoroughly to remove the Shampoo.  4.  Use CHG as you would any other liquid soap.  You can apply chg directly  to the skin and wash gently with scrungie or a clean washcloth.  5.  Apply the CHG Soap to your body ONLY FROM THE NECK DOWN.  Do not use on open wounds or open sores.  Avoid contact with your eyes ears, mouth and genitals (private parts).  Wash genitals (private parts)       with your normal soap.  6.  Wash thoroughly, paying special attention to the area where your surgery will be performed.  7.  Thoroughly rinse your body with warm water from the neck down.  8.  DO NOT shower/wash with your normal soap after using and rinsing off the CHG Soap.  9.  Pat yourself dry with a clean towel.            10.  Wear clean pajamas.            11.  Place clean sheets on your bed the night of your first shower and do not sleep with pets.  Day of Surgery  Do not apply any lotions/deodorants the morning of surgery.  Please wear clean clothes to the hospital/surgery center.   Please read over the following fact sheets that you were given: Pain Booklet, Coughing and Deep Breathing, Blood Transfusion Information, Open Heart Packet, MRSA Information and Surgical Site Infection Prevention

## 2013-10-13 MED ORDER — POTASSIUM CHLORIDE 2 MEQ/ML IV SOLN
80.0000 meq | INTRAVENOUS | Status: DC
  Filled 2013-10-13: qty 40

## 2013-10-13 MED ORDER — PHENYLEPHRINE HCL 10 MG/ML IJ SOLN
30.0000 ug/min | INTRAVENOUS | Status: AC
  Administered 2013-10-14: 20 ug/min via INTRAVENOUS
  Filled 2013-10-13: qty 2

## 2013-10-13 MED ORDER — INSULIN REGULAR HUMAN 100 UNIT/ML IJ SOLN
INTRAMUSCULAR | Status: AC
  Administered 2013-10-14: 1 [IU]/h via INTRAVENOUS
  Filled 2013-10-13: qty 1

## 2013-10-13 MED ORDER — CHLORHEXIDINE GLUCONATE 4 % EX LIQD
30.0000 mL | CUTANEOUS | Status: DC
  Filled 2013-10-13: qty 30

## 2013-10-13 MED ORDER — VANCOMYCIN HCL 10 G IV SOLR
1250.0000 mg | INTRAVENOUS | Status: AC
  Administered 2013-10-14: 1250 mg via INTRAVENOUS
  Filled 2013-10-13: qty 1250

## 2013-10-13 MED ORDER — METOPROLOL TARTRATE 12.5 MG HALF TABLET
12.5000 mg | ORAL_TABLET | Freq: Once | ORAL | Status: DC
  Filled 2013-10-13: qty 1

## 2013-10-13 MED ORDER — MAGNESIUM SULFATE 50 % IJ SOLN
40.0000 meq | INTRAMUSCULAR | Status: DC
  Filled 2013-10-13: qty 10

## 2013-10-13 MED ORDER — PLASMA-LYTE 148 IV SOLN
INTRAVENOUS | Status: AC
  Administered 2013-10-14: 09:00:00
  Filled 2013-10-13: qty 2.5

## 2013-10-13 MED ORDER — LEVOFLOXACIN IN D5W 500 MG/100ML IV SOLN
500.0000 mg | INTRAVENOUS | Status: AC
  Administered 2013-10-14: 500 mg via INTRAVENOUS
  Filled 2013-10-13: qty 100

## 2013-10-13 MED ORDER — SODIUM CHLORIDE 0.9 % IV SOLN
INTRAVENOUS | Status: DC
  Filled 2013-10-13: qty 40

## 2013-10-13 MED ORDER — DEXMEDETOMIDINE HCL IN NACL 400 MCG/100ML IV SOLN
0.1000 ug/kg/h | INTRAVENOUS | Status: AC
  Administered 2013-10-14: 0.3 ug/kg/h via INTRAVENOUS
  Filled 2013-10-13: qty 100

## 2013-10-13 MED ORDER — DOPAMINE-DEXTROSE 3.2-5 MG/ML-% IV SOLN
2.0000 ug/kg/min | INTRAVENOUS | Status: DC
  Filled 2013-10-13: qty 250

## 2013-10-13 MED ORDER — EPINEPHRINE HCL 1 MG/ML IJ SOLN
0.5000 ug/min | INTRAVENOUS | Status: DC
  Filled 2013-10-13: qty 4

## 2013-10-13 MED ORDER — NITROGLYCERIN IN D5W 200-5 MCG/ML-% IV SOLN
2.0000 ug/min | INTRAVENOUS | Status: DC
  Filled 2013-10-13: qty 250

## 2013-10-13 MED ORDER — HEPARIN SODIUM (PORCINE) 1000 UNIT/ML IJ SOLN
INTRAMUSCULAR | Status: DC
  Filled 2013-10-13: qty 30

## 2013-10-14 ENCOUNTER — Inpatient Hospital Stay (HOSPITAL_COMMUNITY)
Admission: RE | Admit: 2013-10-14 | Discharge: 2013-10-18 | DRG: 220 | Disposition: A | Discharge status: Home / Self Care | Payer: Medicare PPO | Source: Ambulatory Visit | Attending: Surgery | Admitting: Surgery

## 2013-10-14 ENCOUNTER — Encounter (HOSPITAL_COMMUNITY)
Admission: RE | Disposition: A | Discharge status: Home / Self Care | Payer: Medicare PPO | Source: Ambulatory Visit | Attending: Surgery

## 2013-10-14 ENCOUNTER — Inpatient Hospital Stay (HOSPITAL_COMMUNITY): Payer: Medicare PPO

## 2013-10-14 ENCOUNTER — Inpatient Hospital Stay (HOSPITAL_COMMUNITY): Payer: Medicare PPO | Admitting: Anesthesiology

## 2013-10-14 ENCOUNTER — Encounter (HOSPITAL_COMMUNITY): Payer: Medicare PPO | Admitting: Anesthesiology

## 2013-10-14 ENCOUNTER — Encounter (HOSPITAL_COMMUNITY): Payer: Self-pay | Admitting: *Deleted

## 2013-10-14 DIAGNOSIS — Z882 Allergy status to sulfonamides status: Secondary | ICD-10-CM | POA: Diagnosis not present

## 2013-10-14 DIAGNOSIS — I359 Nonrheumatic aortic valve disorder, unspecified: Secondary | ICD-10-CM | POA: Diagnosis present

## 2013-10-14 DIAGNOSIS — Z9089 Acquired absence of other organs: Secondary | ICD-10-CM

## 2013-10-14 DIAGNOSIS — Z888 Allergy status to other drugs, medicaments and biological substances status: Secondary | ICD-10-CM

## 2013-10-14 DIAGNOSIS — I498 Other specified cardiac arrhythmias: Secondary | ICD-10-CM | POA: Diagnosis not present

## 2013-10-14 DIAGNOSIS — E559 Vitamin D deficiency, unspecified: Secondary | ICD-10-CM | POA: Diagnosis present

## 2013-10-14 DIAGNOSIS — I10 Essential (primary) hypertension: Secondary | ICD-10-CM | POA: Diagnosis present

## 2013-10-14 DIAGNOSIS — D62 Acute posthemorrhagic anemia: Secondary | ICD-10-CM | POA: Diagnosis not present

## 2013-10-14 DIAGNOSIS — Z7982 Long term (current) use of aspirin: Secondary | ICD-10-CM

## 2013-10-14 DIAGNOSIS — I2789 Other specified pulmonary heart diseases: Secondary | ICD-10-CM | POA: Diagnosis present

## 2013-10-14 DIAGNOSIS — E8779 Other fluid overload: Secondary | ICD-10-CM | POA: Diagnosis not present

## 2013-10-14 DIAGNOSIS — I2510 Atherosclerotic heart disease of native coronary artery without angina pectoris: Secondary | ICD-10-CM | POA: Diagnosis present

## 2013-10-14 DIAGNOSIS — Q231 Congenital insufficiency of aortic valve: Secondary | ICD-10-CM | POA: Diagnosis not present

## 2013-10-14 DIAGNOSIS — E669 Obesity, unspecified: Secondary | ICD-10-CM | POA: Diagnosis present

## 2013-10-14 DIAGNOSIS — Z880 Allergy status to penicillin: Secondary | ICD-10-CM | POA: Diagnosis not present

## 2013-10-14 DIAGNOSIS — E7800 Pure hypercholesterolemia, unspecified: Secondary | ICD-10-CM | POA: Diagnosis present

## 2013-10-14 DIAGNOSIS — Z8249 Family history of ischemic heart disease and other diseases of the circulatory system: Secondary | ICD-10-CM

## 2013-10-14 DIAGNOSIS — Z6833 Body mass index (BMI) 33.0-33.9, adult: Secondary | ICD-10-CM | POA: Diagnosis not present

## 2013-10-14 DIAGNOSIS — D696 Thrombocytopenia, unspecified: Secondary | ICD-10-CM | POA: Diagnosis not present

## 2013-10-14 DIAGNOSIS — I350 Nonrheumatic aortic (valve) stenosis: Secondary | ICD-10-CM

## 2013-10-14 DIAGNOSIS — E119 Type 2 diabetes mellitus without complications: Secondary | ICD-10-CM | POA: Diagnosis present

## 2013-10-14 DIAGNOSIS — Z952 Presence of prosthetic heart valve: Secondary | ICD-10-CM

## 2013-10-14 DIAGNOSIS — Z79899 Other long term (current) drug therapy: Secondary | ICD-10-CM

## 2013-10-14 DIAGNOSIS — Q230 Congenital stenosis of aortic valve: Secondary | ICD-10-CM

## 2013-10-14 HISTORY — PX: INTRAOPERATIVE TRANSESOPHAGEAL ECHOCARDIOGRAM: SHX5062

## 2013-10-14 HISTORY — PX: AORTIC VALVE REPLACEMENT: SHX41

## 2013-10-14 LAB — POCT I-STAT 3, ART BLOOD GAS (G3+)
ACID-BASE EXCESS: 6 mmol/L — AB (ref 0.0–2.0)
Acid-Base Excess: 7 mmol/L — ABNORMAL HIGH (ref 0.0–2.0)
Acid-base deficit: 1 mmol/L (ref 0.0–2.0)
Bicarbonate: 29.8 mEq/L — ABNORMAL HIGH (ref 20.0–24.0)
Bicarbonate: 28.7 mEq/L — ABNORMAL HIGH (ref 20.0–24.0)
Bicarbonate: 24.2 mEq/L — ABNORMAL HIGH (ref 20.0–24.0)
Bicarbonate: 24.6 mEq/L — ABNORMAL HIGH (ref 20.0–24.0)
Bicarbonate: 25.6 mEq/L — ABNORMAL HIGH (ref 20.0–24.0)
O2 Saturation: 100 %
O2 Saturation: 100 %
O2 Saturation: 99 %
O2 Saturation: 99 %
O2 Saturation: 98 %
PCO2 ART: 36.1 mmHg (ref 35.0–45.0)
PCO2 ART: 40.2 mmHg (ref 35.0–45.0)
PCO2 ART: 38.8 mmHg (ref 35.0–45.0)
PCO2 ART: 42.6 mmHg (ref 35.0–45.0)
PH ART: 7.409 (ref 7.350–7.450)
PH ART: 7.386 (ref 7.350–7.450)
PO2 ART: 308 mmHg — AB (ref 80.0–100.0)
PO2 ART: 140 mmHg — AB (ref 80.0–100.0)
PO2 ART: 108 mmHg — AB (ref 80.0–100.0)
Patient temperature: 37.1
Patient temperature: 36.7
Patient temperature: 36.8
TCO2: 31 mmol/L (ref 0–100)
TCO2: 30 mmol/L (ref 0–100)
TCO2: 25 mmol/L (ref 0–100)
TCO2: 26 mmol/L (ref 0–100)
TCO2: 27 mmol/L (ref 0–100)
pCO2 arterial: 34.5 mmHg — ABNORMAL LOW (ref 35.0–45.0)
pH, Arterial: 7.525 — ABNORMAL HIGH (ref 7.350–7.450)
pH, Arterial: 7.528 — ABNORMAL HIGH (ref 7.350–7.450)
pH, Arterial: 7.387 (ref 7.350–7.450)
pO2, Arterial: 333 mmHg — ABNORMAL HIGH (ref 80.0–100.0)
pO2, Arterial: 147 mmHg — ABNORMAL HIGH (ref 80.0–100.0)

## 2013-10-14 LAB — POCT I-STAT, CHEM 8
BUN: 20 mg/dL (ref 6–23)
BUN: 21 mg/dL (ref 6–23)
BUN: 18 mg/dL (ref 6–23)
BUN: 19 mg/dL (ref 6–23)
BUN: 20 mg/dL (ref 6–23)
BUN: 15 mg/dL (ref 6–23)
CALCIUM ION: 1.11 mmol/L — AB (ref 1.13–1.30)
CALCIUM ION: 1.2 mmol/L (ref 1.13–1.30)
CHLORIDE: 100 meq/L (ref 96–112)
CHLORIDE: 101 meq/L (ref 96–112)
CREATININE: 0.7 mg/dL (ref 0.50–1.10)
CREATININE: 0.8 mg/dL (ref 0.50–1.10)
Calcium, Ion: 1.18 mmol/L (ref 1.13–1.30)
Calcium, Ion: 1.23 mmol/L (ref 1.13–1.30)
Calcium, Ion: 1.07 mmol/L — ABNORMAL LOW (ref 1.13–1.30)
Calcium, Ion: 1.09 mmol/L — ABNORMAL LOW (ref 1.13–1.30)
Chloride: 98 mEq/L (ref 96–112)
Chloride: 101 mEq/L (ref 96–112)
Chloride: 100 mEq/L (ref 96–112)
Chloride: 105 mEq/L (ref 96–112)
Creatinine, Ser: 0.6 mg/dL (ref 0.50–1.10)
Creatinine, Ser: 0.6 mg/dL (ref 0.50–1.10)
Creatinine, Ser: 0.6 mg/dL (ref 0.50–1.10)
Creatinine, Ser: 0.6 mg/dL (ref 0.50–1.10)
GLUCOSE: 114 mg/dL — AB (ref 70–99)
GLUCOSE: 111 mg/dL — AB (ref 70–99)
Glucose, Bld: 128 mg/dL — ABNORMAL HIGH (ref 70–99)
Glucose, Bld: 119 mg/dL — ABNORMAL HIGH (ref 70–99)
Glucose, Bld: 151 mg/dL — ABNORMAL HIGH (ref 70–99)
Glucose, Bld: 167 mg/dL — ABNORMAL HIGH (ref 70–99)
HCT: 32 % — ABNORMAL LOW (ref 36.0–46.0)
HCT: 26 % — ABNORMAL LOW (ref 36.0–46.0)
HCT: 28 % — ABNORMAL LOW (ref 36.0–46.0)
HCT: 19 % — ABNORMAL LOW (ref 36.0–46.0)
HEMATOCRIT: 35 % — AB (ref 36.0–46.0)
HEMATOCRIT: 24 % — AB (ref 36.0–46.0)
HEMOGLOBIN: 9.5 g/dL — AB (ref 12.0–15.0)
Hemoglobin: 10.9 g/dL — ABNORMAL LOW (ref 12.0–15.0)
Hemoglobin: 11.9 g/dL — ABNORMAL LOW (ref 12.0–15.0)
Hemoglobin: 8.8 g/dL — ABNORMAL LOW (ref 12.0–15.0)
Hemoglobin: 8.2 g/dL — ABNORMAL LOW (ref 12.0–15.0)
Hemoglobin: 6.5 g/dL — CL (ref 12.0–15.0)
POTASSIUM: 3.3 meq/L — AB (ref 3.7–5.3)
Potassium: 3.2 mEq/L — ABNORMAL LOW (ref 3.7–5.3)
Potassium: 4.2 mEq/L (ref 3.7–5.3)
Potassium: 4.2 mEq/L (ref 3.7–5.3)
Potassium: 3.7 mEq/L (ref 3.7–5.3)
Potassium: 4.4 mEq/L (ref 3.7–5.3)
SODIUM: 139 meq/L (ref 137–147)
Sodium: 142 mEq/L (ref 137–147)
Sodium: 142 mEq/L (ref 137–147)
Sodium: 140 mEq/L (ref 137–147)
Sodium: 140 mEq/L (ref 137–147)
Sodium: 143 mEq/L (ref 137–147)
TCO2: 29 mmol/L (ref 0–100)
TCO2: 31 mmol/L (ref 0–100)
TCO2: 29 mmol/L (ref 0–100)
TCO2: 28 mmol/L (ref 0–100)
TCO2: 28 mmol/L (ref 0–100)
TCO2: 23 mmol/L (ref 0–100)

## 2013-10-14 LAB — PLATELET COUNT: PLATELETS: 160 10*3/uL (ref 150–400)

## 2013-10-14 LAB — POCT I-STAT 4, (NA,K, GLUC, HGB,HCT)
GLUCOSE: 119 mg/dL — AB (ref 70–99)
HEMATOCRIT: 29 % — AB (ref 36.0–46.0)
Hemoglobin: 9.9 g/dL — ABNORMAL LOW (ref 12.0–15.0)
Potassium: 3.1 mEq/L — ABNORMAL LOW (ref 3.7–5.3)
SODIUM: 138 meq/L (ref 137–147)

## 2013-10-14 LAB — CBC
HEMATOCRIT: 30.7 % — AB (ref 36.0–46.0)
HEMATOCRIT: 21.8 % — AB (ref 36.0–46.0)
Hemoglobin: 10.2 g/dL — ABNORMAL LOW (ref 12.0–15.0)
Hemoglobin: 7.3 g/dL — ABNORMAL LOW (ref 12.0–15.0)
MCH: 29.7 pg (ref 26.0–34.0)
MCH: 30 pg (ref 26.0–34.0)
MCHC: 33.2 g/dL (ref 30.0–36.0)
MCHC: 33.5 g/dL (ref 30.0–36.0)
MCV: 89.5 fL (ref 78.0–100.0)
MCV: 89.7 fL (ref 78.0–100.0)
PLATELETS: 78 10*3/uL — AB (ref 150–400)
Platelets: 134 10*3/uL — ABNORMAL LOW (ref 150–400)
RBC: 3.43 MIL/uL — ABNORMAL LOW (ref 3.87–5.11)
RBC: 2.43 MIL/uL — ABNORMAL LOW (ref 3.87–5.11)
RDW: 13.4 % (ref 11.5–15.5)
RDW: 13.5 % (ref 11.5–15.5)
WBC: 24.1 10*3/uL — ABNORMAL HIGH (ref 4.0–10.5)
WBC: 10.9 10*3/uL — ABNORMAL HIGH (ref 4.0–10.5)

## 2013-10-14 LAB — GLUCOSE, CAPILLARY
GLUCOSE-CAPILLARY: 102 mg/dL — AB (ref 70–99)
GLUCOSE-CAPILLARY: 114 mg/dL — AB (ref 70–99)
GLUCOSE-CAPILLARY: 113 mg/dL — AB (ref 70–99)
GLUCOSE-CAPILLARY: 154 mg/dL — AB (ref 70–99)
Glucose-Capillary: 98 mg/dL (ref 70–99)
Glucose-Capillary: 134 mg/dL — ABNORMAL HIGH (ref 70–99)
Glucose-Capillary: 126 mg/dL — ABNORMAL HIGH (ref 70–99)

## 2013-10-14 LAB — HEMOGLOBIN AND HEMATOCRIT, BLOOD
HEMATOCRIT: 26.7 % — AB (ref 36.0–46.0)
Hemoglobin: 8.9 g/dL — ABNORMAL LOW (ref 12.0–15.0)

## 2013-10-14 LAB — PREPARE RBC (CROSSMATCH)

## 2013-10-14 LAB — CREATININE, SERUM
Creatinine, Ser: 0.72 mg/dL (ref 0.50–1.10)
GFR calc Af Amer: >90 mL/min (ref >90)
GFR calc non Af Amer: 82 mL/min — ABNORMAL LOW (ref >90)

## 2013-10-14 LAB — MAGNESIUM: Magnesium: 3 mg/dL — ABNORMAL HIGH (ref 1.5–2.5)

## 2013-10-14 LAB — PROTIME-INR
INR: 1.51 — ABNORMAL HIGH (ref 0.00–1.49)
Prothrombin Time: 18.2 seconds — ABNORMAL HIGH (ref 11.6–15.2)

## 2013-10-14 LAB — APTT: APTT: 37 s (ref 24–37)

## 2013-10-14 LAB — FIBRINOGEN: Fibrinogen: 124 mg/dL — ABNORMAL LOW (ref 204–475)

## 2013-10-14 SURGERY — REPLACEMENT, AORTIC VALVE, OPEN
Anesthesia: General | Site: Chest

## 2013-10-14 MED ORDER — HEPARIN SODIUM (PORCINE) 1000 UNIT/ML IJ SOLN
INTRAMUSCULAR | Status: DC | PRN
  Administered 2013-10-14: 20000 [IU] via INTRAVENOUS

## 2013-10-14 MED ORDER — SODIUM CHLORIDE 0.9 % IJ SOLN
INTRAMUSCULAR | Status: AC
  Filled 2013-10-14: qty 20

## 2013-10-14 MED ORDER — ROCURONIUM BROMIDE 100 MG/10ML IV SOLN
INTRAVENOUS | Status: DC | PRN
  Administered 2013-10-14 (×2): 50 mg via INTRAVENOUS

## 2013-10-14 MED ORDER — POTASSIUM CHLORIDE 10 MEQ/50ML IV SOLN
10.0000 meq | INTRAVENOUS | Status: AC
  Administered 2013-10-14 (×2): 10 meq via INTRAVENOUS

## 2013-10-14 MED ORDER — PROTAMINE SULFATE 10 MG/ML IV SOLN
INTRAVENOUS | Status: AC
  Filled 2013-10-14: qty 25

## 2013-10-14 MED ORDER — PROPOFOL 10 MG/ML IV BOLUS
INTRAVENOUS | Status: AC
  Filled 2013-10-14: qty 20

## 2013-10-14 MED ORDER — ACETAMINOPHEN 650 MG RE SUPP
650.0000 mg | Freq: Once | RECTAL | Status: AC
  Administered 2013-10-14: 650 mg via RECTAL

## 2013-10-14 MED ORDER — NEOSTIGMINE METHYLSULFATE 10 MG/10ML IV SOLN
INTRAVENOUS | Status: AC
  Filled 2013-10-14: qty 2

## 2013-10-14 MED ORDER — VANCOMYCIN HCL IN DEXTROSE 1-5 GM/200ML-% IV SOLN
1000.0000 mg | Freq: Once | INTRAVENOUS | Status: AC
  Administered 2013-10-14: 1000 mg via INTRAVENOUS
  Filled 2013-10-14: qty 200

## 2013-10-14 MED ORDER — SODIUM CHLORIDE 0.45 % IV SOLN
INTRAVENOUS | Status: DC
  Administered 2013-10-14: 13:00:00 via INTRAVENOUS

## 2013-10-14 MED ORDER — SODIUM CHLORIDE 0.9 % IV SOLN
INTRAVENOUS | Status: DC
  Administered 2013-10-14: 13:00:00 via INTRAVENOUS

## 2013-10-14 MED ORDER — LACTATED RINGERS IV SOLN: 500.0000 mL | Freq: Once | INTRAVENOUS | Status: AC | PRN

## 2013-10-14 MED ORDER — SODIUM CHLORIDE 0.9 % IV SOLN: 250.0000 mL | INTRAVENOUS | Status: DC

## 2013-10-14 MED ORDER — HEMOSTATIC AGENTS (NO CHARGE) OPTIME
TOPICAL | Status: DC | PRN
  Administered 2013-10-14: 1 via TOPICAL

## 2013-10-14 MED ORDER — ARTIFICIAL TEARS OP OINT
TOPICAL_OINTMENT | OPHTHALMIC | Status: DC | PRN
  Administered 2013-10-14: 1 via OPHTHALMIC

## 2013-10-14 MED ORDER — EPHEDRINE SULFATE 50 MG/ML IJ SOLN
INTRAMUSCULAR | Status: AC
  Filled 2013-10-14: qty 1

## 2013-10-14 MED ORDER — FENTANYL CITRATE 0.05 MG/ML IJ SOLN
INTRAMUSCULAR | Status: AC
  Filled 2013-10-14: qty 5

## 2013-10-14 MED ORDER — BISACODYL 5 MG PO TBEC
10.0000 mg | DELAYED_RELEASE_TABLET | Freq: Every day | ORAL | Status: DC
  Administered 2013-10-15: 10 mg via ORAL
  Filled 2013-10-14: qty 2

## 2013-10-14 MED ORDER — LIDOCAINE HCL (CARDIAC) 20 MG/ML IV SOLN
INTRAVENOUS | Status: DC | PRN
  Administered 2013-10-14: 80 mg via INTRAVENOUS

## 2013-10-14 MED ORDER — HEPARIN SODIUM (PORCINE) 1000 UNIT/ML IJ SOLN
INTRAMUSCULAR | Status: AC
  Filled 2013-10-14: qty 2

## 2013-10-14 MED ORDER — GLYCOPYRROLATE 0.2 MG/ML IJ SOLN
INTRAMUSCULAR | Status: AC
  Filled 2013-10-14: qty 2

## 2013-10-14 MED ORDER — FENTANYL CITRATE 0.05 MG/ML IJ SOLN
INTRAMUSCULAR | Status: DC | PRN
  Administered 2013-10-14 (×2): 250 ug via INTRAVENOUS
  Administered 2013-10-14: 500 ug via INTRAVENOUS
  Administered 2013-10-14: 250 ug via INTRAVENOUS

## 2013-10-14 MED ORDER — SODIUM CHLORIDE 0.9 % IJ SOLN
INTRAMUSCULAR | Status: AC
  Filled 2013-10-14: qty 10

## 2013-10-14 MED ORDER — EPHEDRINE SULFATE 50 MG/ML IJ SOLN
INTRAMUSCULAR | Status: DC | PRN
  Administered 2013-10-14: 5 mg via INTRAVENOUS

## 2013-10-14 MED ORDER — MORPHINE SULFATE 2 MG/ML IJ SOLN
1.0000 mg | INTRAMUSCULAR | Status: DC | PRN
  Administered 2013-10-14: 2 mg via INTRAVENOUS

## 2013-10-14 MED ORDER — NITROGLYCERIN IN D5W 200-5 MCG/ML-% IV SOLN: 0.0000 ug/min | INTRAVENOUS | Status: DC

## 2013-10-14 MED ORDER — MORPHINE SULFATE 2 MG/ML IJ SOLN
2.0000 mg | INTRAMUSCULAR | Status: DC | PRN
  Administered 2013-10-14: 2 mg via INTRAVENOUS
  Filled 2013-10-14 (×2): qty 1

## 2013-10-14 MED ORDER — PANTOPRAZOLE SODIUM 40 MG PO TBEC
40.0000 mg | DELAYED_RELEASE_TABLET | Freq: Every day | ORAL | Status: DC
  Administered 2013-10-16 – 2013-10-18 (×3): 40 mg via ORAL
  Filled 2013-10-14 (×3): qty 1

## 2013-10-14 MED ORDER — ACETAMINOPHEN 500 MG PO TABS
1000.0000 mg | ORAL_TABLET | Freq: Four times a day (QID) | ORAL | Status: DC
  Administered 2013-10-15 – 2013-10-17 (×5): 1000 mg via ORAL
  Filled 2013-10-14 (×18): qty 2

## 2013-10-14 MED ORDER — LACTATED RINGERS IV SOLN
INTRAVENOUS | Status: DC | PRN
  Administered 2013-10-14 (×2): via INTRAVENOUS

## 2013-10-14 MED ORDER — INSULIN REGULAR BOLUS VIA INFUSION
0.0000 [IU] | Freq: Three times a day (TID) | INTRAVENOUS | Status: DC
  Filled 2013-10-14: qty 10

## 2013-10-14 MED ORDER — FAMOTIDINE IN NACL 20-0.9 MG/50ML-% IV SOLN
20.0000 mg | Freq: Two times a day (BID) | INTRAVENOUS | Status: DC
  Administered 2013-10-14: 20 mg via INTRAVENOUS

## 2013-10-14 MED ORDER — THROMBIN 20000 UNITS EX SOLR
CUTANEOUS | Status: AC
  Filled 2013-10-14: qty 20000

## 2013-10-14 MED ORDER — ONDANSETRON HCL 4 MG/2ML IJ SOLN
4.0000 mg | Freq: Four times a day (QID) | INTRAMUSCULAR | Status: DC | PRN
  Administered 2013-10-15 – 2013-10-16 (×3): 4 mg via INTRAVENOUS
  Filled 2013-10-14 (×3): qty 2

## 2013-10-14 MED ORDER — ACETAMINOPHEN 160 MG/5ML PO SOLN: 650.0000 mg | Freq: Once | ORAL | Status: AC

## 2013-10-14 MED ORDER — ASPIRIN 81 MG PO CHEW: 324.0000 mg | CHEWABLE_TABLET | Freq: Every day | ORAL | Status: DC

## 2013-10-14 MED ORDER — LACTATED RINGERS IV SOLN: INTRAVENOUS | Status: DC

## 2013-10-14 MED ORDER — MIDAZOLAM HCL 10 MG/2ML IJ SOLN
INTRAMUSCULAR | Status: AC
  Filled 2013-10-14: qty 2

## 2013-10-14 MED ORDER — POTASSIUM CHLORIDE 10 MEQ/50ML IV SOLN
10.0000 meq | INTRAVENOUS | Status: AC
  Administered 2013-10-14 (×3): 10 meq via INTRAVENOUS

## 2013-10-14 MED ORDER — DOCUSATE SODIUM 100 MG PO CAPS
200.0000 mg | ORAL_CAPSULE | Freq: Every day | ORAL | Status: DC
  Administered 2013-10-15 – 2013-10-18 (×2): 200 mg via ORAL
  Filled 2013-10-14 (×3): qty 2

## 2013-10-14 MED ORDER — ROCURONIUM BROMIDE 50 MG/5ML IV SOLN
INTRAVENOUS | Status: AC
  Filled 2013-10-14: qty 1

## 2013-10-14 MED ORDER — BISACODYL 10 MG RE SUPP: 10.0000 mg | Freq: Every day | RECTAL | Status: DC

## 2013-10-14 MED ORDER — 0.9 % SODIUM CHLORIDE (POUR BTL) OPTIME
TOPICAL | Status: DC | PRN
  Administered 2013-10-14: 1000 mL

## 2013-10-14 MED ORDER — METOPROLOL TARTRATE 1 MG/ML IV SOLN
2.5000 mg | INTRAVENOUS | Status: DC | PRN
Start: 2013-10-14 — End: 2013-10-15

## 2013-10-14 MED ORDER — INSULIN REGULAR HUMAN 100 UNIT/ML IJ SOLN
INTRAMUSCULAR | Status: DC
  Filled 2013-10-14 (×2): qty 1

## 2013-10-14 MED ORDER — LIDOCAINE HCL (CARDIAC) 20 MG/ML IV SOLN
INTRAVENOUS | Status: AC
  Filled 2013-10-14: qty 5

## 2013-10-14 MED ORDER — SUCCINYLCHOLINE CHLORIDE 20 MG/ML IJ SOLN
INTRAMUSCULAR | Status: AC
  Filled 2013-10-14: qty 1

## 2013-10-14 MED ORDER — DEXMEDETOMIDINE HCL IN NACL 200 MCG/50ML IV SOLN
0.1000 ug/kg/h | INTRAVENOUS | Status: DC
  Administered 2013-10-14: 0.4 ug/kg/h via INTRAVENOUS
  Administered 2013-10-14: 0.7 ug/kg/h via INTRAVENOUS
  Filled 2013-10-14 (×2): qty 50

## 2013-10-14 MED ORDER — AMINOCAPROIC ACID 250 MG/ML IV SOLN
10.0000 g | INTRAVENOUS | Status: DC | PRN
  Administered 2013-10-14: 5 g/h via INTRAVENOUS

## 2013-10-14 MED ORDER — VECURONIUM BROMIDE 10 MG IV SOLR
INTRAVENOUS | Status: AC
  Filled 2013-10-14: qty 10

## 2013-10-14 MED ORDER — ACETAMINOPHEN 160 MG/5ML PO SOLN
1000.0000 mg | Freq: Four times a day (QID) | ORAL | Status: DC
  Filled 2013-10-14: qty 40

## 2013-10-14 MED ORDER — MIDAZOLAM HCL 2 MG/2ML IJ SOLN: 2.0000 mg | INTRAMUSCULAR | Status: DC | PRN

## 2013-10-14 MED ORDER — GLYCOPYRROLATE 0.2 MG/ML IJ SOLN
INTRAMUSCULAR | Status: DC | PRN
  Administered 2013-10-14: 0.2 mg via INTRAVENOUS

## 2013-10-14 MED ORDER — PHENYLEPHRINE HCL 10 MG/ML IJ SOLN
0.0000 ug/min | INTRAVENOUS | Status: DC
  Administered 2013-10-15: 40 ug/min via INTRAVENOUS
  Filled 2013-10-14 (×2): qty 2

## 2013-10-14 MED ORDER — PROTAMINE SULFATE 10 MG/ML IV SOLN
INTRAVENOUS | Status: DC | PRN
  Administered 2013-10-14: 200 mg via INTRAVENOUS

## 2013-10-14 MED ORDER — METOPROLOL TARTRATE 25 MG/10 ML ORAL SUSPENSION
12.5000 mg | Freq: Two times a day (BID) | ORAL | Status: DC
  Filled 2013-10-14 (×3): qty 5

## 2013-10-14 MED ORDER — THROMBIN 5000 UNITS EX SOLR
CUTANEOUS | Status: AC
  Filled 2013-10-14: qty 5000

## 2013-10-14 MED ORDER — METOPROLOL TARTRATE 12.5 MG HALF TABLET
12.5000 mg | ORAL_TABLET | Freq: Two times a day (BID) | ORAL | Status: DC
  Filled 2013-10-14 (×3): qty 1

## 2013-10-14 MED ORDER — ARTIFICIAL TEARS OP OINT
TOPICAL_OINTMENT | OPHTHALMIC | Status: AC
  Filled 2013-10-14: qty 3.5

## 2013-10-14 MED ORDER — VECURONIUM BROMIDE 10 MG IV SOLR
INTRAVENOUS | Status: DC | PRN
  Administered 2013-10-14: 10 mg via INTRAVENOUS

## 2013-10-14 MED ORDER — MIDAZOLAM HCL 5 MG/5ML IJ SOLN
INTRAMUSCULAR | Status: DC | PRN
  Administered 2013-10-14: 2 mg via INTRAVENOUS
  Administered 2013-10-14: 5 mg via INTRAVENOUS
  Administered 2013-10-14: 3 mg via INTRAVENOUS

## 2013-10-14 MED ORDER — LEVOFLOXACIN IN D5W 750 MG/150ML IV SOLN
750.0000 mg | INTRAVENOUS | Status: AC
  Administered 2013-10-15: 750 mg via INTRAVENOUS
  Filled 2013-10-14: qty 150

## 2013-10-14 MED ORDER — PROPOFOL 10 MG/ML IV BOLUS
INTRAVENOUS | Status: DC | PRN
Start: 2013-10-14 — End: 2013-10-14
  Administered 2013-10-14: 100 mg via INTRAVENOUS

## 2013-10-14 MED ORDER — ASPIRIN EC 325 MG PO TBEC
325.0000 mg | DELAYED_RELEASE_TABLET | Freq: Every day | ORAL | Status: DC
  Administered 2013-10-15 – 2013-10-18 (×4): 325 mg via ORAL
  Filled 2013-10-14 (×4): qty 1

## 2013-10-14 MED ORDER — TRAMADOL HCL 50 MG PO TABS
50.0000 mg | ORAL_TABLET | ORAL | Status: DC | PRN
  Administered 2013-10-15: 50 mg via ORAL
  Administered 2013-10-15: 100 mg via ORAL
  Filled 2013-10-14: qty 2
  Filled 2013-10-14 (×2): qty 1

## 2013-10-14 MED ORDER — ALBUMIN HUMAN 5 % IV SOLN
250.0000 mL | INTRAVENOUS | Status: AC | PRN
  Filled 2013-10-14: qty 250

## 2013-10-14 MED ORDER — SODIUM CHLORIDE 0.9 % IJ SOLN
3.0000 mL | Freq: Two times a day (BID) | INTRAMUSCULAR | Status: DC
  Administered 2013-10-15 – 2013-10-18 (×5): 3 mL via INTRAVENOUS

## 2013-10-14 MED ORDER — MAGNESIUM SULFATE 4000MG/100ML IJ SOLN
4.0000 g | Freq: Once | INTRAMUSCULAR | Status: AC
Start: 2013-10-14 — End: 2013-10-14
  Administered 2013-10-14: 4 g via INTRAVENOUS
  Filled 2013-10-14: qty 100

## 2013-10-14 MED ORDER — SODIUM CHLORIDE 0.9 % IJ SOLN: 3.0000 mL | INTRAMUSCULAR | Status: DC | PRN

## 2013-10-14 MED FILL — Heparin Sodium (Porcine) Inj 1000 Unit/ML: INTRAMUSCULAR | Qty: 10 | Status: AC

## 2013-10-14 MED FILL — Lidocaine HCl IV Inj 20 MG/ML: INTRAVENOUS | Qty: 5 | Status: AC

## 2013-10-14 MED FILL — Mannitol IV Soln 20%: INTRAVENOUS | Qty: 500 | Status: AC

## 2013-10-14 MED FILL — Sodium Bicarbonate IV Soln 8.4%: INTRAVENOUS | Qty: 50 | Status: AC

## 2013-10-14 MED FILL — Electrolyte-R (PH 7.4) Solution: INTRAVENOUS | Qty: 3000 | Status: AC

## 2013-10-14 MED FILL — Sodium Chloride IV Soln 0.9%: INTRAVENOUS | Qty: 2000 | Status: AC

## 2013-10-14 SURGICAL SUPPLY — 67 items
ADAPTER CARDIO PERF ANTE/RETRO (ADAPTER) ×3 IMPLANT
ADPR PRFSN 84XANTGRD RTRGD (ADAPTER) ×2
ATTRACTOMAT 16X20 MAGNETIC DRP (DRAPES) ×3 IMPLANT
BAG DECANTER FOR FLEXI CONT (MISCELLANEOUS) ×3 IMPLANT
BLADE STERNUM SYSTEM 6 (BLADE) ×3 IMPLANT
BLADE SURG 15 STRL LF DISP TIS (BLADE) ×2 IMPLANT
BLADE SURG 15 STRL SS (BLADE) ×3
CANISTER SUCTION 2500CC (MISCELLANEOUS) ×3 IMPLANT
CANNULA GUNDRY RCSP 15FR (MISCELLANEOUS) ×3 IMPLANT
CATH ROBINSON RED A/P 18FR (CATHETERS) ×9 IMPLANT
CATH THORACIC 36FR (CATHETERS) ×3 IMPLANT
CATH THORACIC 36FR RT ANG (CATHETERS) ×3 IMPLANT
CONT SPEC 4OZ CLIKSEAL STRL BL (MISCELLANEOUS) ×1 IMPLANT
CONT SPEC STER OR (MISCELLANEOUS) ×3 IMPLANT
COVER SURGICAL LIGHT HANDLE (MISCELLANEOUS) ×3 IMPLANT
CRADLE DONUT ADULT HEAD (MISCELLANEOUS) ×3 IMPLANT
DRAPE SLUSH/WARMER DISC (DRAPES) ×3 IMPLANT
DRSG COVADERM 4X14 (GAUZE/BANDAGES/DRESSINGS) ×3 IMPLANT
ELECT CAUTERY BLADE 6.4 (BLADE) ×3 IMPLANT
ELECT REM PT RETURN 9FT ADLT (ELECTROSURGICAL) ×6
ELECTRODE REM PT RTRN 9FT ADLT (ELECTROSURGICAL) ×4 IMPLANT
GLOVE BIO SURGEON STRL SZ 6 (GLOVE) IMPLANT
GLOVE BIO SURGEON STRL SZ 6.5 (GLOVE) ×4 IMPLANT
GLOVE BIO SURGEON STRL SZ7 (GLOVE) IMPLANT
GLOVE BIO SURGEON STRL SZ7.5 (GLOVE) IMPLANT
GLOVE BIOGEL PI IND STRL 6 (GLOVE) IMPLANT
GLOVE BIOGEL PI IND STRL 6.5 (GLOVE) IMPLANT
GLOVE BIOGEL PI INDICATOR 6 (GLOVE) ×2
GLOVE BIOGEL PI INDICATOR 6.5 (GLOVE) ×3
GLOVE EUDERMIC 7 POWDERFREE (GLOVE) ×6 IMPLANT
GOWN STRL REUS W/ TWL LRG LVL3 (GOWN DISPOSABLE) ×8 IMPLANT
GOWN STRL REUS W/ TWL XL LVL3 (GOWN DISPOSABLE) ×2 IMPLANT
GOWN STRL REUS W/TWL LRG LVL3 (GOWN DISPOSABLE) ×12
GOWN STRL REUS W/TWL XL LVL3 (GOWN DISPOSABLE) ×3
HEART VENT LT CURVED (MISCELLANEOUS) ×3 IMPLANT
HEMOSTAT POWDER SURGIFOAM 1G (HEMOSTASIS) ×9 IMPLANT
HEMOSTAT SURGICEL 2X14 (HEMOSTASIS) ×3 IMPLANT
INDICATOR STEAM BIO RAPID (STERILIZATION PRODUCTS) IMPLANT
KIT BASIN OR (CUSTOM PROCEDURE TRAY) ×3 IMPLANT
KIT CATH CPB BARTLE (MISCELLANEOUS) ×3 IMPLANT
KIT ROOM TURNOVER OR (KITS) ×3 IMPLANT
KIT SUCTION CATH 14FR (SUCTIONS) ×3 IMPLANT
LINE VENT (MISCELLANEOUS) ×1 IMPLANT
NS IRRIG 1000ML POUR BTL (IV SOLUTION) ×18 IMPLANT
PACK OPEN HEART (CUSTOM PROCEDURE TRAY) ×3 IMPLANT
PAD ARMBOARD 7.5X6 YLW CONV (MISCELLANEOUS) ×6 IMPLANT
SET CARDIOPLEGIA MPS 5001102 (MISCELLANEOUS) ×1 IMPLANT
SPONGE GAUZE 4X4 12PLY (GAUZE/BANDAGES/DRESSINGS) ×3 IMPLANT
SUT BONE WAX W31G (SUTURE) ×3 IMPLANT
SUT ETHIBON 2 0 V 52N 30 (SUTURE) ×6 IMPLANT
SUT PROLENE 3 0 SH DA (SUTURE) IMPLANT
SUT PROLENE 3 0 SH1 36 (SUTURE) ×3 IMPLANT
SUT PROLENE 4 0 RB 1 (SUTURE) ×9
SUT PROLENE 4-0 RB1 .5 CRCL 36 (SUTURE) ×6 IMPLANT
SUT STEEL 6MS V (SUTURE) IMPLANT
SUT STEEL SZ 6 DBL 3X14 BALL (SUTURE) ×2 IMPLANT
SUT VIC AB 1 CTX 36 (SUTURE) ×6
SUT VIC AB 1 CTX36XBRD ANBCTR (SUTURE) ×4 IMPLANT
SUTURE E-PAK OPEN HEART (SUTURE) ×3 IMPLANT
SYSTEM SAHARA CHEST DRAIN ATS (WOUND CARE) ×3 IMPLANT
TAPE CLOTH SURG 4X10 WHT LF (GAUZE/BANDAGES/DRESSINGS) ×1 IMPLANT
TOWEL OR 17X24 6PK STRL BLUE (TOWEL DISPOSABLE) ×6 IMPLANT
TOWEL OR 17X26 10 PK STRL BLUE (TOWEL DISPOSABLE) ×6 IMPLANT
TRAY FOLEY IC TEMP SENS 16FR (CATHETERS) ×3 IMPLANT
UNDERPAD 30X30 INCONTINENT (UNDERPADS AND DIAPERS) ×3 IMPLANT
VALVE MAGNA EASE AORTIC 23MM (Prosthesis & Implant Heart) ×1 IMPLANT
WATER STERILE IRR 1000ML POUR (IV SOLUTION) ×6 IMPLANT

## 2013-10-14 NOTE — Op Note (Signed)
CARDIOVASCULAR SURGERY OPERATIVE NOTE  10/14/2013 [name] [id number]  Surgeon:  Gaye Pollack, MD  First Assistant: Lars Pinks, PA-C   Preoperative Diagnosis:  Severe aortic stenosis   Postoperative Diagnosis:  Same   Procedure:  1. Median Sternotomy 2. Extracorporeal circulation 3.   Aortic valve replacement using a 23 mm  Edwards  Magna-Ease pericardial valve.  Anesthesia:  General Endotracheal   Clinical History/Surgical Indication:  The patient is a 73 year old woman with a history of hypertension, hypercholesterolemia, and aortic stenosis that was moderate by echo in 2008 with no further evaluation since then. She recently presented to Dr. [name] with a 6 month history of shortness of breath with exertion. A 2D echo on 09/03/2013 showed severe AS with a mean gradient of 74 and a peak of 121 with an AVA of 0.83 cm2. LVEF was 60-65% with mild LVH, trivial MR. She underwent cardiac cath showing normal coronary arteries and mild pulmonary HTN. Since the heart cath she says that she has felt differently with more shortness of breath and chest pain. She has severe, symptomatic aortic stenosis that requires AVR for relief of symptoms and improvement in long-term prognosis, avoidance of LV dysfunction and death. She had questions about TAVR and I explained to her and her family why she is not a candidate for that procedure. I discussed the operative procedure with the patient and family including alternatives, benefits and risks; including but not limited to bleeding, blood transfusion, infection, stroke, myocardial infarction, graft failure, heart block requiring a permanent pacemaker, organ dysfunction, and death. [name] understands and agrees to proceed   Preparation:  The patient was seen in the preoperative holding area and the correct patient, correct operation were confirmed with the patient after reviewing the medical record and catheterization. The consent was  signed by me. Preoperative antibiotics were given. A pulmonary arterial line and radial arterial line were placed by the anesthesia team. The patient was taken back to the operating room and positioned supine on the operating room table. After being placed under general endotracheal anesthesia by the anesthesia team a foley catheter was placed. The neck, chest, abdomen, and both legs were prepped with betadine soap and solution and draped in the usual sterile manner. A surgical time-out was taken and the correct patient and operative procedure were confirmed with the nursing and anesthesia staff.   Pre-bypass TEE:   Complete TEE assessment was performed by Dr. Albertha Ghee. 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    Post-bypass TEE:   Normal functioning prosthetic aortic valve with no perivalvular leak or regurgitation through the valve. Left ventricular function preserved. No mitral regurgitation.    Cardiopulmonary Bypass:  A median sternotomy was performed. The pericardium was opened in the midline. Right ventricular function appeared normal. The ascending aorta was of normal size and had no palpable plaque. There were no contraindications to aortic cannulation or cross-clamping. The patient was fully systemically heparinized and the ACT was maintained > 400 sec. The proximal aortic arch was cannulated with a 20 F aortic cannula for arterial inflow. Venous cannulation was performed via the right atrial appendage using a two-staged venous cannula. An antegrade cardioplegia/vent cannula was inserted into the mid-ascending aorta. A left ventricular vent was placed via the right superior pulmonary vein. A retrograde cardioplegia cannnula was placed into the coronary sinus via the right atrium. Aortic occlusion was performed with a single cross-clamp. Systemic cooling to 32  degrees Centigrade and topical cooling of the heart with iced saline were  used. Hyperkalemic antegrade cold blood cardioplegia was used to induce diastolic arrest and then cold blood retrograde cardioplegia was given at about 20 minute intervals throughout the period of arrest to maintain myocardial temperature at or below 10 degrees centigrade. A temperature probe was inserted into the interventricular septum and an insulating pad was placed in the pericardium. Carbon dioxide was insufflated into the pericardium at 5L/min throughout the procedure to minimize intracardiac air.   Aortic Valve Replacement:   A transverse aortotomy was performed 1 cm above the take-off of the right coronary artery. The native valve was a true bicuspid valve with 2 commissures, with calcified leaflets and mild annular calcification. The ostia of the coronary arteries were approximately 180 degrees apart and were not obstructed. The native valve leaflets were excised and the annulus was decalcified with rongeurs. Care was taken to remove all particulate debris. The left ventricle was directly inspected for debris and then irrigated with ice saline solution. The annulus was sized and a size 23 mm Edwards Magna-Ease  Pericardial  valve was chosen. The model number was [id number] and the serial number was [id number]. While the valve was being prepared 2-0 Ethibond pledgeted horizontal mattress sutures were placed around the annulus with the pledgets in a sub-annular position. The sutures were placed through the sewing ring and the valve lowered into place. The sutures were tied sequentially. The valve seated nicely and the coronary ostia were not obstructed. The prosthetic valve leaflets moved normally and there was no sub-valvular obstruction. The aortotomy was closed using 4-0 Prolene suture in 2 layers with felt strips to reinforce the closure.  Completion:  The patient was rewarmed to 37 degrees Centigrade. De-airing maneuvers were performed and the head placed in trendelenburg position. The crossclamp was  removed with a time of 84 minutes. There was spontaneous return of sinus rhythm. The aortotomy was checked for hemostasis. Two temporary epicardial pacing wires were placed on the right atrium and two on the right ventricle. The left ventricular vent and retrograde cardioplegia cannulas were removed. The patient was weaned from CPB without difficulty on no inotropes. CPB time was 105 minutes. Cardiac output was 5 LPM. Heparin was fully reversed with protamine and the aortic and venous cannulas removed. Hemostasis was achieved. Mediastinal drainage tubes were placed. The sternum was closed with double #6 stainless steel wires. The fascia was closed with continuous # 1 vicryl suture. The subcutaneous tissue was closed with 2-0 vicryl continuous suture. The skin was closed with 3-0 vicryl subcuticular suture. All sponge, needle, and instrument counts were reported correct at the end of the case. Dry sterile dressings were placed over the incisions and around the chest tubes which were connected to pleurevac suction. The patient was then transported to the surgical intensive care unit in critical but stable condition.

## 2013-10-14 NOTE — OR Nursing (Signed)
1st call to SICU 1134, spoke with Doris.

## 2013-10-14 NOTE — Progress Notes (Signed)
Utilization review completed. Terrika Zuver, RN, BSN. 

## 2013-10-14 NOTE — Anesthesia Postprocedure Evaluation (Signed)
  Anesthesia Post-op Note  Patient: [name]  Procedure(s) Performed: Procedure(s): AORTIC VALVE REPLACEMENT (AVR) (N/A) INTRAOPERATIVE TRANSESOPHAGEAL ECHOCARDIOGRAM (N/A)  Patient Location: ICU  Anesthesia Type:General  Level of Consciousness: sedated  Airway and Oxygen Therapy: Patient remains intubated per anesthesia plan  Post-op Pain: none  Post-op Assessment: Post-op Vital signs reviewed, Patient's Cardiovascular Status Stable and Respiratory Function Stable  Post-op Vital Signs: Reviewed and stable  Last Vitals:  Filed Vitals:   10/14/13 1235  BP: 111/50  Pulse: 61  Temp:   Resp: 16    Complications: No apparent anesthesia complications

## 2013-10-14 NOTE — Progress Notes (Signed)
Pt unable to understand how to blow out while intubated to measure vital capacity.  NIF -20, ABG WNL.  Pt alert and following all other commands.  Dr. Cyndia Bent made aware of above info, ok to continue with extubation.    Vista Lawman, RN

## 2013-10-14 NOTE — Anesthesia Preprocedure Evaluation (Signed)
Anesthesia Evaluation  Patient identified by MRN, date of birth, ID band Patient awake    Reviewed: Allergy & Precautions, H&P , NPO status , Patient's Chart, lab work & pertinent test results  Airway Mallampati: II  Neck ROM: full    Dental   Pulmonary shortness of breath,          Cardiovascular hypertension, + CAD + Valvular Problems/Murmurs AS     Neuro/Psych Anxiety    GI/Hepatic   Endo/Other  obese  Renal/GU      Musculoskeletal   Abdominal   Peds  Hematology   Anesthesia Other Findings   Reproductive/Obstetrics                           Anesthesia Physical Anesthesia Plan  ASA: III  Anesthesia Plan: General   Post-op Pain Management:    Induction: Intravenous  Airway Management Planned: Oral ETT  Additional Equipment: Arterial line, CVP, PA Cath, TEE and Ultrasound Guidance Line Placement  Intra-op Plan:   Post-operative Plan: Post-operative intubation/ventilation  Informed Consent: I have reviewed the patients History and Physical, chart, labs and discussed the procedure including the risks, benefits and alternatives for the proposed anesthesia with the patient or authorized representative who has indicated his/her understanding and acceptance.     Plan Discussed with: CRNA, Anesthesiologist and Surgeon  Anesthesia Plan Comments:         Anesthesia Quick Evaluation

## 2013-10-14 NOTE — Interval H&P Note (Signed)
History and Physical Interval Note:  10/14/2013 6:23 AM  [name]  has presented today for surgery, with the diagnosis of aortic stenosis  The various methods of treatment have been discussed with the patient and family. After consideration of risks, benefits and other options for treatment, the patient has consented to  Procedure(s): AORTIC VALVE REPLACEMENT (AVR) (N/A) INTRAOPERATIVE TRANSESOPHAGEAL ECHOCARDIOGRAM (N/A) as a surgical intervention .  The patient's history has been reviewed, patient examined, no change in status, stable for surgery.  I have reviewed the patient's chart and labs.  Questions were answered to the patient's satisfaction.     Gaye Pollack

## 2013-10-14 NOTE — Progress Notes (Signed)
Cryo given per Dr. Cyndia Bent- Cryo 5 units per bag, two bags checked documented and given per protocol for a total of ten units.

## 2013-10-14 NOTE — Procedures (Signed)
Extubation Procedure Note  Patient Details:   Name: [name] DOB: 03-09-1941 MRN: [id number]   Airway Documentation:  Airway 7.5 mm (Active)  Secured at (cm) 21 cm 10/14/2013  8:55 PM  Measured From Lips 10/14/2013  8:55 PM  Secured Location Right 10/14/2013  8:55 PM  Secured By Pink Tape 10/14/2013  8:55 PM  Site Condition Dry 10/14/2013  8:55 PM    Evaluation  O2 sats: stable throughout Complications: No apparent complications Patient did tolerate procedure well. Bilateral Breath Sounds: Clear   Yes  Pt. Was extubated to a 3L [place] without any complications, dyspnea or stridor noted. Pt. Achieved a goal of -20 on NIF. Pt. Didn't understand how to perform VC & Dr. Cyndia Bent was made aware. Pt. Was instructed on IS x 5, highest goal achieved was 533mL.   Claretta Fraise 10/14/2013, 9:45 PM

## 2013-10-14 NOTE — Progress Notes (Signed)
Patient ID: [name], female   DOB: August 05, 1940, 73 y.o.   MRN: [id number]   SICU Evening Rounds:   Hemodynamically stable  CI = 2.9  Has started to wake up on vent. Moves all extremities to command   Urine output good  CT output low  CBC    Component Value Date/Time   WBC 24.1* 10/14/2013 1259   RBC 3.43* 10/14/2013 1259   HGB 10.2* 10/14/2013 1259   HCT 30.7* 10/14/2013 1259   PLT 134* 10/14/2013 1259   MCV 89.5 10/14/2013 1259   MCH 29.7 10/14/2013 1259   MCHC 33.2 10/14/2013 1259   RDW 13.4 10/14/2013 1259   LYMPHSABS 3.2 09/22/2013 2148   MONOABS 0.7 09/22/2013 2148   EOSABS 0.1 09/22/2013 2148   BASOSABS 0.0 09/22/2013 2148     BMET    Component Value Date/Time   NA 138 10/14/2013 1255   K 3.1* 10/14/2013 1255   CL 100 10/14/2013 1132   CO2 23 10/12/2013 1118   GLUCOSE 119* 10/14/2013 1255   BUN 20 10/14/2013 1132   CREATININE 0.60 10/14/2013 1132   CALCIUM 9.7 10/12/2013 1118   GFRNONAA 82* 10/12/2013 1118   GFRAA >90 10/12/2013 1118     A/P:  Stable postop course. Continue current plans. Follow up on pm labs.

## 2013-10-14 NOTE — Brief Op Note (Addendum)
10/14/2013  11:08 AM  PATIENT:  [name]  73 y.o. female  PRE-OPERATIVE DIAGNOSIS:  1.Severe Aortic Stenosis 2. Bicuspid Aortic Valve  POST-OPERATIVE DIAGNOSIS:  Severe Aortic Stenosis 2. Bicuspid Aortic Valve  PROCEDURE: INTRAOPERATIVE TRANSESOPHAGEAL ECHOCARDIOGRAM, MEDIAN STERNOTOMY for  AORTIC VALVE REPLACEMENT (AVR) (using a Pericardial Tissue Valve, size 23 mm)   SURGEON:  Surgeon(s) and Role:    * Gaye Pollack, MD - Primary  PHYSICIAN ASSISTANT: Lars Pinks PA-C  ANESTHESIA:   general  EBL:  Total I/O In: -  Out: 450 [Urine:450]   DRAINS: Chest tubes placed in the mediastinal and pleural spaces    SPECIMEN:  Source of Specimen:  Native AV leaflets  DISPOSITION OF SPECIMEN:  PATHOLOGY  COUNTS CORRECT:  YES  DICTATION: .Dragon Dictation  PLAN OF CARE: Admit to inpatient   PATIENT DISPOSITION:  ICU - intubated and hemodynamically stable.   Delay start of Pharmacological VTE agent (>24hrs) due to surgical blood loss or risk of bleeding: yes   BASELINE WEIGHT: 77 kg  Aortic Valve Etiology   Aortic Insufficiency:  Trivial/Trace  Aortic Valve Disease:  Yes.  Aortic Stenosis:  Yes. Smallest Aortic Valve Area: 0.83cm2; Highest Mean Gradient: 111mmHg.  Etiology (Choose at least one and up to  5 etiologies):  Bicuspid valve disease   Aortic Valve  Procedure Performed:  Replacement: Yes.  Bioprosthetic Valve. Implant Model Number:[id number], Size:23 mm, Unique Device Identifier:[id number]  Repair/Reconstruction: No.   Aortic Annular Enlargement: No.

## 2013-10-14 NOTE — Anesthesia Procedure Notes (Signed)
Procedures

## 2013-10-14 NOTE — Transfer of Care (Signed)
Immediate Anesthesia Transfer of Care Note  Patient: [name]  Procedure(s) Performed: Procedure(s): AORTIC VALVE REPLACEMENT (AVR) (N/A) INTRAOPERATIVE TRANSESOPHAGEAL ECHOCARDIOGRAM (N/A)  Patient Location: SICU  Anesthesia Type:General  Level of Consciousness: Patient remains intubated per anesthesia plan  Airway & Oxygen Therapy: Patient remains intubated per anesthesia plan and Patient placed on Ventilator (see vital sign flow sheet for setting)  Post-op Assessment: Report given to PACU RN and Post -op Vital signs reviewed and stable  Post vital signs: Reviewed and stable  Complications: No apparent anesthesia complications

## 2013-10-14 NOTE — OR Nursing (Signed)
2nd call to SICU 1208.

## 2013-10-14 NOTE — H&P (Signed)
[address]       [place] [address]             [phone]      Cardiothoracic Surgery History and Physical   PCP is Horton Finer, MD  Referring Provider is Sueanne Margarita, MD   Chief Complaint   Patient presents with   .  Aortic Stenosis     Surgical eval, 2D Echo 09/03/13, Cardiac Cath 09/16/13- Dr Radford Pax    HPI:   The patient is a 73 year old woman with a history of hypertension, hypercholesterolemia, and aortic stenosis that was moderate by echo in 2008 with no further evaluation since then. She recently presented to Dr. [name] with a 6 month history of shortness of breath with exertion. A 2D echo on 09/03/2013 showed severe AS with a mean gradient of 74 and a peak of 121 with an AVA of 0.83 cm2. LVEF was 60-65% with mild LVH, trivial MR. She underwent cardiac cath showing normal coronary arteries and mild pulmonary HTN. Since the heart cath she says that she has felt differently with more shortness of breath and chest pain. She was seen in the ER on 09/22/2013 and was not felt to have any acute cardiac problem and and was sent home.   Past Medical History   Diagnosis  Date   .  Obesity    .  Abnormal vaginal Pap smear  1994     annual paps for years after that.more recently every other year,last in 2012-we agreed not to do them anymore   .  Hypercholesteremia    .  Osteopenia      declines treatment    .  Aortic stenosis    .  Pneumonia  1995   .  Vitamin D insufficiency    .  Shoulder pain      Due to arthritis   .  Hypertension    .  Coronary artery disease  2008     nonobstructive ASCAD with 30% LAD    Past Surgical History    Procedure  Laterality  Date   .  Cholecystectomy     .  Cardiac catheterization      Family History   Problem  Relation  Age of Onset   .  Dementia  Mother    .  Heart disease  Father    .  Heart attack  Father    .  Heart disease  Brother    .  Heart attack  Brother    .  Arrhythmia  Sister       Social History  History   Substance Use Topics   .  Smoking status:  Never Smoker   .  Smokeless tobacco:  Not on file   .  Alcohol Use:  No    Current Outpatient Prescriptions   Medication  Sig  Dispense  Refill   .  aspirin 81 MG tablet  Take 81 mg by mouth every other day.     .  Cholecalciferol (VITAMIN D) 2000 UNITS tablet  Take 2,000 Units by mouth every other day.     .  hydrochlorothiazide (HYDRODIURIL) 25 MG tablet  Take 25 mg by mouth daily.     Marland Kitchen  lisinopril (PRINIVIL,ZESTRIL) 10 MG tablet  Take 10 mg by mouth daily.      No current facility-administered medications for this visit.    Allergies   Allergen  Reactions   .  Codeine    .  Lipitor [Atorvastatin]      Stomach pain   .  Penicillins    .  Pravastatin      Myalgias   .  Simvastatin      Myalgias   .  Sulfa Antibiotics     Review of Systems   Constitutional: Positive for fatigue. Negative for fever, chills and activity change.  HENT: Negative.  Wears dentures  Eyes: Negative.  Respiratory: Positive for shortness of breath.  Cardiovascular: Negative for chest pain, palpitations and leg swelling.  Gastrointestinal: Negative.  Endocrine: Negative.  Genitourinary: Negative.  Musculoskeletal: Negative.  Allergic/Immunologic: Negative.  Neurological: Negative.  Hematological: Negative.  Psychiatric/Behavioral: The patient is nervous/anxious.   BP 136/63  Pulse 60  Resp 20  Ht 5\' 1"  (1.549 m)  Wt 175 lb (79.379 kg)  BMI 33.08 kg/m2  SpO2 97%   Physical Exam   Constitutional: She is oriented to person, place, and time. She appears well-developed and well-nourished. No distress.  HENT:  Head: Atraumatic.  Mouth/Throat: Oropharynx is clear and moist.  Eyes: EOM are normal. Pupils are equal, round, and reactive to light.  Neck: Normal range of motion. Neck supple. No JVD present. No tracheal deviation present. No thyromegaly present.  Cardiovascular: Normal rate, regular rhythm and intact distal  pulses.  Murmur heard. Harsh 3/6 systolic murmur over RUSB  Pulmonary/Chest: Effort normal and breath sounds normal. No respiratory distress. She has no rales.  Abdominal: Bowel sounds are normal. She exhibits no distension and no mass. There is no tenderness.  Musculoskeletal: Normal range of motion.  Lymphadenopathy:  She has no cervical adenopathy.  Neurological: She is alert and oriented to person, place, and time. She has normal strength. No cranial nerve deficit or sensory deficit.  Skin: Skin is warm and dry.  Psychiatric: She has a normal mood and affect.    Diagnostic Tests:   [facility] [address], [place] [address] [phone]  ------------------------------------------------------------------- Transthoracic Echocardiography  Patient: [name], [name] MR #: [id number] Study Date: 09/03/2013 Gender: F Age: 62 Height: 154.9 cm Weight: 80.3 kg BSA: 1.89 m^2 Pt. Status: Room:  ATTENDING Fransico Him, MD ORDERING Fransico Him, MD REFERRING Fransico Him, MD SONOGRAPHER Cindy Hazy, RDCS PERFORMING [facility], Outpatient  cc:  ------------------------------------------------------------------- LV EF: 60% - 65%  ------------------------------------------------------------------- Indications: 424.1 Aortic valve disorders.  ------------------------------------------------------------------- History: PMH: CAD. Shortness of Breath. Pneumonia. Risk factors: Hypertension. Hypercholesterolemia.  ------------------------------------------------------------------- Study Conclusions  - Left ventricle: The cavity size was normal. Wall thickness was increased in a pattern of mild LVH. Systolic function was normal. The estimated ejection fraction was in the range of 60% to 65%. Wall motion was normal; there were no regional wall motion abnormalities. Doppler parameters are consistent with abnormal left ventricular relaxation (grade 1 diastolic  dysfunction). - Aortic valve: Probably trileaflet; severely calcified leaflets. There was severe stenosis. There was trivial regurgitation. Mean gradient (S): 74 mm Hg. Peak gradient (S): 121 mm Hg. Valve area (VTI): 0.83 cm^2. - Mitral valve: Mildly calcified annulus. There was trivial regurgitation. - Left atrium: The atrium was mildly dilated. - Right ventricle: The cavity size was normal. Systolic function was normal. - Tricuspid valve: Peak RV-RA gradient (S): 29 mm Hg. - Pulmonary arteries: PA peak pressure: 32 mm Hg (S). - Inferior vena cava: The vessel was normal in size. The respirophasic diameter changes were in the normal range (= 50%), consistent with normal central venous pressure. - Pericardium, extracardiac: A trivial pericardial  effusion was identified.  Impressions:  - Normal LV size with mild LV hypertrophy. EF 60-65%. Normal RV size and systolic function. Severe aortic stenosis with trivial aortic insufficiency.  -------------------------------------------------------------------  ------------------------------------------------------------------- Left ventricle: The cavity size was normal. Wall thickness was increased in a pattern of mild LVH. Systolic function was normal. The estimated ejection fraction was in the range of 60% to 65%. Wall motion was normal; there were no regional wall motion abnormalities. Doppler parameters are consistent with abnormal left ventricular relaxation (grade 1 diastolic dysfunction).  ------------------------------------------------------------------- Aortic valve: Probably trileaflet; severely calcified leaflets. Doppler: There was severe stenosis. There was trivial regurgitation. VTI ratio of LVOT to aortic valve: 0.22. Valve area (VTI): 0.83 cm^2. Indexed valve area (VTI): 0.44 cm^2/m^2. Valve area (Vmax): 0.85 cm^2. Indexed valve area (Vmax): 0.45 cm^2/m^2. Mean gradient (S): 74 mm Hg. Peak gradient (S): 121  mm Hg.  ------------------------------------------------------------------- Aorta: Aortic root: The aortic root was normal in size. Ascending aorta: The ascending aorta was normal in size.  ------------------------------------------------------------------- Mitral valve: Mildly calcified annulus. Doppler: There was no evidence for stenosis. There was trivial regurgitation. Peak gradient (D): 3 mm Hg.  ------------------------------------------------------------------- Left atrium: The atrium was mildly dilated.  ------------------------------------------------------------------- Right ventricle: The cavity size was normal. Systolic function was normal.  ------------------------------------------------------------------- Pulmonic valve: Structurally normal valve. Cusp separation was normal. Doppler: Transvalvular velocity was within the normal range. There was no regurgitation.  ------------------------------------------------------------------- Tricuspid valve: Doppler: There was trivial regurgitation.  ------------------------------------------------------------------- Right atrium: The atrium was normal in size.  ------------------------------------------------------------------- Pericardium: A trivial pericardial effusion was identified.  ------------------------------------------------------------------- Systemic veins: Inferior vena cava: The vessel was normal in size. The respirophasic diameter changes were in the normal range (= 50%), consistent with normal central venous pressure.  ------------------------------------------------------------------- Prepared and Electronically Authenticated by  Loralie Champagne, M.D. 2015-05-29T17:50:02  ------------------------------------------------------------------- Measurements  Left ventricle Value Reference LV ID, ED, PLAX chordal (N) 44 mm 43 - 52 LV ID, ES, PLAX chordal (N) 23 mm 23 - 38 LV fx shortening, PLAX chordal  (N) 48 % >=29 LV PW thickness, ED 13.6 mm --------- IVS/LV PW ratio, ED (N) 0.96 <=1.3 Stroke volume, 2D 118 ml --------- Stroke volume/bsa, 2D 62 ml/m^2 --------- LV e&', lateral 7.46 cm/s --------- LV E/e&', lateral 12.49 --------- LV e&', medial 7.02 cm/s --------- LV E/e&', medial 13.28 --------- LV e&', average 7.24 cm/s --------- LV E/e&', average 12.87 ---------  Ventricular septum Value Reference IVS thickness, ED 13.1 mm ---------  LVOT Value Reference LVOT ID, S 22 mm --------- LVOT area 3.8 cm^2 --------- LVOT VTI, S 31.1 cm ---------  Aortic valve Value Reference Aortic valve peak velocity, S 549 cm/s --------- Aortic valve mean velocity, S 392 cm/s --------- Aortic valve VTI, S 143 cm --------- Aortic mean gradient, S 74 mm Hg --------- Aortic peak gradient, S 121 mm Hg --------- VTI ratio, LVOT/AV 0.22 --------- Aortic valve area, VTI 0.83 cm^2 --------- Aortic valve area/bsa, VTI 0.44 cm^2/m^2 --------- Aortic valve area, peak velocity 0.85 cm^2 --------- Aortic valve area/bsa, peak 0.45 cm^2/m^2 --------- velocity Aortic regurg pressure half-time 856 ms ---------  Aorta Value Reference Aortic root ID, ED 33 mm ---------  Left atrium Value Reference LA ID, A-P, ES 40 mm --------- LA ID/bsa, A-P (N) 2.11 cm/m^2 <=2.2  Mitral valve Value Reference Mitral E-wave peak velocity 93.2 cm/s --------- Mitral A-wave peak velocity 114 cm/s --------- Mitral deceleration time (H) 250 ms 150 - 230 Mitral peak gradient, D 3 mm Hg --------- Mitral E/A ratio, peak 0.8 ---------  Pulmonary arteries Value Reference PA pressure, S, DP (H) 32 mm Hg <=30  Tricuspid valve Value Reference Tricuspid regurg peak velocity 267 cm/s --------- Tricuspid peak RV-RA gradient 29 mm Hg --------- Tricuspid maximal regurg 267 cm/s --------- velocity, PISA  Right ventricle Value Reference RV s&', lateral, S 19.9 cm/s ---------  Legend: (L) and (H) mark values outside specified  reference range.  (N) marks values inside specified reference range.    Cardiac Cath Procedure Note  Indication: SOB with severe AS by echo  Procedures performed:  1) Right heart cathererization  2) Selective coronary angiography  3) Left heart catheterization  4) Left ventriculogram  Description of procedure:   The risks and indication of the procedure were explained. Consent was signed and placed on the chart. An appropriate timeout was taken prior to the procedure. The right groin was prepped and draped in the routine sterile fashion and anesthetized with 1% local lidocaine.  A 5 FR arterial sheath was placed in the right femoral artery using a modified Seldinger technique. Standard catheters including a JL4, JR4 and angled pigtail were used. All catheter exchanges were made over a wire. A 7 FR venous sheath was placed in the right femoral vein using a modified Seldinger technique. A standard Swan-Ganz catheter was used for the procedure.  Complications: None apparent  Findings:  RA = 12/11 with mean 72mmHg  RV = 42/10 with mean 52mmHg  PA = 39/19 with mean 56mmHg  PCW = 27/22 with mean 82mmHg  Fick cardiac output/index =5.15/2.88  PVR =116D/S  FA sat = 94%  PA sat = 70%  The catheter was not placed across the AV due to very severe AS documented at echo.  Left main:widely patent and bifurcates into an LAD and left circumflex arteries  LAD: Widely patent throughout. It gives rise to a small first diagonal which is patent. It then gives rise in the mid portion to a second moderate sized diagonal #2 which is patent and then a 3rd diagonal which is small and patent. The ongoing LAD traverses to the apex and is patent.  LCX: Widely patent throughout its course in the AV groove. It gives rise to a moderate sized OM#1 which is patent and then gives rise to a large OM#2 which is widely patent. The ongoing left circuflex tapers into a small vessel that is patent.  IEP:PIRJJO patent throughout  its course. It bifurcates distally into a PDA and PL branches which are patent.  LV-gram not done due to severe AS  Assessment:  1. Normal coronary arteries  2. Normal LVF by Echo  3. Severe AS by echo with mean AVG 43mmHg  4. 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 Elevated PCW secondary to #4  Plan/Discussion:  1. D/C home when bedrest completed  2. Outpt consult with Dr. Prescott Gum for AVR  3. Followup with Richardson Dopp PA-C in 2 weeks  Sueanne Margarita, MD  10:58 AM    Impression:   She has severe, symptomatic aortic stenosis that requires AVR for relief of symptoms and improvement in long-term prognosis, avoidance of LV dysfunction and death. She had questions about TAVR and I explained to her and her family why she is not a candidate for that procedure. I discussed the operative procedure with the patient and family including alternatives, benefits and risks; including but not limited to bleeding, blood transfusion, infection, stroke, myocardial infarction, graft failure, heart block requiring a permanent pacemaker, organ dysfunction, and death. [name]  understands and agrees to proceed.   Plan:   AVR using a tissue valve on 10/14/2013

## 2013-10-15 ENCOUNTER — Inpatient Hospital Stay (HOSPITAL_COMMUNITY): Payer: Medicare PPO

## 2013-10-15 ENCOUNTER — Encounter (HOSPITAL_COMMUNITY): Payer: Self-pay | Admitting: Surgery

## 2013-10-15 LAB — PREPARE CRYOPRECIPITATE
UNIT DIVISION: 0
UNIT DIVISION: 0

## 2013-10-15 LAB — TYPE AND SCREEN
ABO/RH(D): O POS
Antibody Screen: NEGATIVE
Unit division: 0

## 2013-10-15 LAB — POCT I-STAT, CHEM 8
BUN: 14 mg/dL (ref 6–23)
CHLORIDE: 96 meq/L (ref 96–112)
CREATININE: 0.8 mg/dL (ref 0.50–1.10)
Calcium, Ion: 1.22 mmol/L (ref 1.13–1.30)
Glucose, Bld: 125 mg/dL — ABNORMAL HIGH (ref 70–99)
HCT: 29 % — ABNORMAL LOW (ref 36.0–46.0)
Hemoglobin: 9.9 g/dL — ABNORMAL LOW (ref 12.0–15.0)
Potassium: 3.6 mEq/L — ABNORMAL LOW (ref 3.7–5.3)
Sodium: 138 mEq/L (ref 137–147)
TCO2: 25 mmol/L (ref 0–100)

## 2013-10-15 LAB — GLUCOSE, CAPILLARY
GLUCOSE-CAPILLARY: 116 mg/dL — AB (ref 70–99)
GLUCOSE-CAPILLARY: 104 mg/dL — AB (ref 70–99)
GLUCOSE-CAPILLARY: 99 mg/dL (ref 70–99)
GLUCOSE-CAPILLARY: 136 mg/dL — AB (ref 70–99)
GLUCOSE-CAPILLARY: 128 mg/dL — AB (ref 70–99)
GLUCOSE-CAPILLARY: 113 mg/dL — AB (ref 70–99)
GLUCOSE-CAPILLARY: 159 mg/dL — AB (ref 70–99)
Glucose-Capillary: 104 mg/dL — ABNORMAL HIGH (ref 70–99)
Glucose-Capillary: 107 mg/dL — ABNORMAL HIGH (ref 70–99)
Glucose-Capillary: 107 mg/dL — ABNORMAL HIGH (ref 70–99)
Glucose-Capillary: 109 mg/dL — ABNORMAL HIGH (ref 70–99)
Glucose-Capillary: 98 mg/dL (ref 70–99)

## 2013-10-15 LAB — CBC
HCT: 24.4 % — ABNORMAL LOW (ref 36.0–46.0)
HCT: 27.4 % — ABNORMAL LOW (ref 36.0–46.0)
HEMOGLOBIN: 8.3 g/dL — AB (ref 12.0–15.0)
HEMOGLOBIN: 9.2 g/dL — AB (ref 12.0–15.0)
MCH: 29.9 pg (ref 26.0–34.0)
MCH: 29.6 pg (ref 26.0–34.0)
MCHC: 34 g/dL (ref 30.0–36.0)
MCHC: 33.6 g/dL (ref 30.0–36.0)
MCV: 87.8 fL (ref 78.0–100.0)
MCV: 88.1 fL (ref 78.0–100.0)
Platelets: 105 10*3/uL — ABNORMAL LOW (ref 150–400)
Platelets: 95 10*3/uL — ABNORMAL LOW (ref 150–400)
RBC: 2.78 MIL/uL — AB (ref 3.87–5.11)
RBC: 3.11 MIL/uL — AB (ref 3.87–5.11)
RDW: 13.7 % (ref 11.5–15.5)
RDW: 14.3 % (ref 11.5–15.5)
WBC: 12.5 10*3/uL — ABNORMAL HIGH (ref 4.0–10.5)
WBC: 12.9 10*3/uL — ABNORMAL HIGH (ref 4.0–10.5)

## 2013-10-15 LAB — PREPARE RBC (CROSSMATCH)

## 2013-10-15 LAB — BASIC METABOLIC PANEL
Anion gap: 12 (ref 5–15)
BUN: 16 mg/dL (ref 6–23)
CALCIUM: 7.8 mg/dL — AB (ref 8.4–10.5)
CO2: 23 meq/L (ref 19–32)
Chloride: 106 mEq/L (ref 96–112)
Creatinine, Ser: 0.67 mg/dL (ref 0.50–1.10)
GFR calc Af Amer: >90 mL/min (ref >90)
GFR calc non Af Amer: 84 mL/min — ABNORMAL LOW (ref >90)
GLUCOSE: 102 mg/dL — AB (ref 70–99)
Potassium: 4.2 mEq/L (ref 3.7–5.3)
Sodium: 141 mEq/L (ref 137–147)

## 2013-10-15 LAB — CREATININE, SERUM
CREATININE: 0.65 mg/dL (ref 0.50–1.10)
GFR calc Af Amer: >90 mL/min (ref >90)
GFR calc non Af Amer: 85 mL/min — ABNORMAL LOW (ref >90)

## 2013-10-15 LAB — MAGNESIUM
MAGNESIUM: 2.1 mg/dL (ref 1.5–2.5)
Magnesium: 2.4 mg/dL (ref 1.5–2.5)

## 2013-10-15 MED ORDER — INSULIN ASPART 100 UNIT/ML ~~LOC~~ SOLN
0.0000 [IU] | SUBCUTANEOUS | Status: DC
Start: 2013-10-15 — End: 2013-10-15

## 2013-10-15 MED ORDER — ENOXAPARIN SODIUM 40 MG/0.4ML ~~LOC~~ SOLN
40.0000 mg | Freq: Every day | SUBCUTANEOUS | Status: DC
  Administered 2013-10-15 – 2013-10-17 (×3): 40 mg via SUBCUTANEOUS
  Filled 2013-10-15 (×4): qty 0.4

## 2013-10-15 MED ORDER — POTASSIUM CHLORIDE 10 MEQ/50ML IV SOLN
10.0000 meq | INTRAVENOUS | Status: AC | PRN
  Administered 2013-10-15 (×3): 10 meq via INTRAVENOUS
  Filled 2013-10-15 (×2): qty 50

## 2013-10-15 MED ORDER — INSULIN ASPART 100 UNIT/ML ~~LOC~~ SOLN
0.0000 [IU] | SUBCUTANEOUS | Status: DC
  Administered 2013-10-15 – 2013-10-16 (×3): 2 [IU] via SUBCUTANEOUS

## 2013-10-15 MED ORDER — FUROSEMIDE 10 MG/ML IJ SOLN
40.0000 mg | Freq: Once | INTRAMUSCULAR | Status: AC
  Administered 2013-10-15: 40 mg via INTRAVENOUS

## 2013-10-15 MED FILL — Magnesium Sulfate Inj 50%: INTRAMUSCULAR | Qty: 10 | Status: AC

## 2013-10-15 MED FILL — Potassium Chloride Inj 2 mEq/ML: INTRAVENOUS | Qty: 40 | Status: AC

## 2013-10-15 MED FILL — Thrombin For Soln 20000 Unit: CUTANEOUS | Qty: 1 | Status: AC

## 2013-10-15 MED FILL — Gelatin Absorbable MT Powder: OROMUCOSAL | Qty: 3 | Status: AC

## 2013-10-15 MED FILL — Heparin Sodium (Porcine) Inj 1000 Unit/ML: INTRAMUSCULAR | Qty: 30 | Status: AC

## 2013-10-15 NOTE — Progress Notes (Addendum)
1 Day Post-Op Procedure(s) (LRB): AORTIC VALVE REPLACEMENT (AVR) (N/A) INTRAOPERATIVE TRANSESOPHAGEAL ECHOCARDIOGRAM (N/A) Subjective: No complaints  Objective: Vital signs in last 24 hours: Temp:  [97 F (36.1 C)-99 F (37.2 C)] 98.8 F (37.1 C) (07/10 0700) Pulse Rate:  [61-90] 90 (07/10 0700) Cardiac Rhythm:  [-] Atrial paced (07/10 0400) Resp:  [11-30] 22 (07/10 0700) BP: (94-126)/(50-63) 125/59 mmHg (07/10 0700) SpO2:  [96 %-100 %] 98 % (07/10 0700) Arterial Line BP: (86-137)/(42-68) 115/48 mmHg (07/10 0700) FiO2 (%):  [40 %-50 %] 40 % (07/09 2055) Weight:  [79.4 kg (175 lb 0.7 oz)] 79.4 kg (175 lb 0.7 oz) (07/10 0500)  Hemodynamic parameters for last 24 hours: PAP: (20-31)/(6-17) 26/9 mmHg CO:  [2.8 L/min-5.3 L/min] 5.3 L/min CI:  [1.6 L/min/m2-3 L/min/m2] 3 L/min/m2  Intake/Output from previous day: 07/09 0701 - 07/10 0700 In: 4414.7 [I.V.:2817.7; Blood:997; IV WYOVZCHYI:502] Out: 7741 [Urine:1390; Blood:850; Chest Tube:1000] Intake/Output this shift:    General appearance: alert and cooperative Neurologic: intact Heart: regular rate and rhythm, S1, S2 normal, no murmur, click, rub or gallop Lungs: clear to auscultation bilaterally Extremities: edema mild Wound: dressing dry  Lab Results:  Recent Labs  10/14/13 1840 10/14/13 1850 10/15/13 0228  WBC 10.9*  --  12.5*  HGB 7.3* 6.5* 8.3*  HCT 21.8* 19.0* 24.4*  PLT 78*  --  105*   BMET:  Recent Labs  10/12/13 1118  10/14/13 1850 10/15/13 0228  NA 140  < > 143 141  K 3.7  < > 4.4 4.2  CL 100  < > 105 106  CO2 23  --   --  23  GLUCOSE 104*  < > 111* 102*  BUN 15  < > 15 16  CREATININE 0.71  < > 0.80 0.67  CALCIUM 9.7  --   --  7.8*  < > = values in this interval not displayed.  PT/INR:  Recent Labs  10/14/13 1259  LABPROT 18.2*  INR 1.51*   ABG    Component Value Date/Time   PHART 7.386 10/14/2013 2256   HCO3 25.6* 10/14/2013 2256   TCO2 27 10/14/2013 2256   ACIDBASEDEF 1.0 10/14/2013 1251   O2SAT 98.0 10/14/2013 2256   CBG (last 3)   Recent Labs  10/15/13 0201 10/15/13 0304 10/15/13 0409  GLUCAP 107* 107* 99   CXR: mild atelectasis  ECG: sinus, non-specific changes  Assessment/Plan: S/P Procedure(s) (LRB): AORTIC VALVE REPLACEMENT (AVR) (N/A) INTRAOPERATIVE TRANSESOPHAGEAL ECHOCARDIOGRAM (N/A) Stable but still on neo. Wean as tolerated. Will stop beta blocker until bp stable off neo. Acute blood loss anemia: 7.3 last pm and received a unit last night. This am was 8.3 so she received a second unit. Mobilize Diuresis Diabetes control: Hgb A1c 5.9. Glucose under good control. Continue SSI. d/c tubes/lines Continue foley due to patient in ICU and urinary output monitoring See progression orders   LOS: 1 day    BARTLE,BRYAN K 10/15/2013

## 2013-10-15 NOTE — Progress Notes (Signed)
CT surgery p.m. Rounds  Patient examined and record reviewed.Hemodynamics stable,labs satisfactory.Patient had stable day.Continue current care. VAN TRIGT III,PETER 10/15/2013

## 2013-10-15 NOTE — Progress Notes (Signed)
Pt's neo was slowly titrated up through the night, currently up to 64mcg.  Hgb this am 8.3 after receiving one unit overnight.  Dr. Cyndia Bent notified of above.  Orders received, will continue to monitor.  Vista Lawman, RN

## 2013-10-16 ENCOUNTER — Inpatient Hospital Stay (HOSPITAL_COMMUNITY): Payer: Medicare PPO

## 2013-10-16 LAB — TYPE AND SCREEN
ABO/RH(D): O POS
Antibody Screen: NEGATIVE
UNIT DIVISION: 0

## 2013-10-16 LAB — BASIC METABOLIC PANEL
Anion gap: 9 (ref 5–15)
BUN: 17 mg/dL (ref 6–23)
CHLORIDE: 101 meq/L (ref 96–112)
CO2: 28 mEq/L (ref 19–32)
Calcium: 8 mg/dL — ABNORMAL LOW (ref 8.4–10.5)
Creatinine, Ser: 0.66 mg/dL (ref 0.50–1.10)
GFR calc Af Amer: >90 mL/min (ref >90)
GFR calc non Af Amer: 84 mL/min — ABNORMAL LOW (ref >90)
GLUCOSE: 119 mg/dL — AB (ref 70–99)
Potassium: 4.1 mEq/L (ref 3.7–5.3)
SODIUM: 138 meq/L (ref 137–147)

## 2013-10-16 LAB — GLUCOSE, CAPILLARY
GLUCOSE-CAPILLARY: 129 mg/dL — AB (ref 70–99)
GLUCOSE-CAPILLARY: 131 mg/dL — AB (ref 70–99)
GLUCOSE-CAPILLARY: 131 mg/dL — AB (ref 70–99)
GLUCOSE-CAPILLARY: 152 mg/dL — AB (ref 70–99)
Glucose-Capillary: 117 mg/dL — ABNORMAL HIGH (ref 70–99)
Glucose-Capillary: 115 mg/dL — ABNORMAL HIGH (ref 70–99)

## 2013-10-16 LAB — CBC
HEMATOCRIT: 25.1 % — AB (ref 36.0–46.0)
HEMOGLOBIN: 8.4 g/dL — AB (ref 12.0–15.0)
MCH: 29.7 pg (ref 26.0–34.0)
MCHC: 33.5 g/dL (ref 30.0–36.0)
MCV: 88.7 fL (ref 78.0–100.0)
PLATELETS: 86 10*3/uL — AB (ref 150–400)
RBC: 2.83 MIL/uL — AB (ref 3.87–5.11)
RDW: 14.5 % (ref 11.5–15.5)
WBC: 14.6 10*3/uL — AB (ref 4.0–10.5)

## 2013-10-16 MED ORDER — POTASSIUM CHLORIDE CRYS ER 10 MEQ PO TBCR
10.0000 meq | EXTENDED_RELEASE_TABLET | Freq: Every day | ORAL | Status: DC
Start: 2013-10-16 — End: 2013-10-18
  Administered 2013-10-16 – 2013-10-18 (×3): 10 meq via ORAL
  Filled 2013-10-16 (×3): qty 1

## 2013-10-16 MED ORDER — MAGNESIUM HYDROXIDE 400 MG/5ML PO SUSP: 30.0000 mL | Freq: Every day | ORAL | Status: DC | PRN

## 2013-10-16 MED ORDER — SODIUM CHLORIDE 0.9 % IJ SOLN
3.0000 mL | Freq: Two times a day (BID) | INTRAMUSCULAR | Status: DC
  Administered 2013-10-17 (×2): 3 mL via INTRAVENOUS

## 2013-10-16 MED ORDER — MOVING RIGHT ALONG BOOK
Freq: Once | Status: AC
  Administered 2013-10-16: 11:00:00
  Filled 2013-10-16: qty 1

## 2013-10-16 MED ORDER — INSULIN ASPART 100 UNIT/ML ~~LOC~~ SOLN
0.0000 [IU] | Freq: Three times a day (TID) | SUBCUTANEOUS | Status: DC
Start: 2013-10-16 — End: 2013-10-17
  Administered 2013-10-16 (×3): 2 [IU] via SUBCUTANEOUS

## 2013-10-16 MED ORDER — SODIUM CHLORIDE 0.9 % IJ SOLN: 3.0000 mL | INTRAMUSCULAR | Status: DC | PRN

## 2013-10-16 MED ORDER — FUROSEMIDE 40 MG PO TABS
40.0000 mg | ORAL_TABLET | Freq: Every day | ORAL | Status: DC
  Administered 2013-10-16 – 2013-10-18 (×3): 40 mg via ORAL
  Filled 2013-10-16 (×3): qty 1

## 2013-10-16 MED ORDER — SODIUM CHLORIDE 0.9 % IV SOLN: 250.0000 mL | INTRAVENOUS | Status: DC | PRN

## 2013-10-16 NOTE — Progress Notes (Signed)
Pt transferred to 2W22 via ambulation.  Pt positioned comfortably in chair, on room air, on monitor, VSS.  Tolerated transfer well.

## 2013-10-16 NOTE — Progress Notes (Signed)
2 Days Post-Op Procedure(s) (LRB): AORTIC VALVE REPLACEMENT (AVR) (N/A) INTRAOPERATIVE TRANSESOPHAGEAL ECHOCARDIOGRAM (N/A) Subjective: Doing well after aortic valve replacement with a bioprosthetic valve for AS Off oxygen maintaining sinus rhythm, some sinus bradycardia and beta blockers are held for pulse less than 60 Ambulating in hallway Patient will move to step down today  Objective: Vital signs in last 24 hours: Temp:  [97.8 F (36.6 C)-99 F (37.2 C)] 99 F (37.2 C) (07/11 1030) Pulse Rate:  [64-91] 67 (07/11 1500) Cardiac Rhythm:  [-] Atrial paced (07/11 1400) Resp:  [13-29] 29 (07/11 1500) BP: (108-139)/(44-63) 124/50 mmHg (07/11 1500) SpO2:  [88 %-99 %] 95 % (07/11 1500) Arterial Line BP: (109)/(41) 109/41 mmHg (07/10 1600) Weight:  [175 lb 14.8 oz (79.8 kg)] 175 lb 14.8 oz (79.8 kg) (07/11 0600)  Hemodynamic parameters for last 24 hours:    Intake/Output from previous day: 07/10 0701 - 07/11 0700 In: 1539.5 [P.O.:600; I.V.:454.5; Blood:335; IV Piggyback:150] Out: 2855 [Urine:2675; Chest Tube:180] Intake/Output this shift: Total I/O In: 480 [P.O.:480] Out: 255 [Urine:255]  Alert and comfortable Breath sounds clear Sternal incision clean dry No murmur  Lab Results:  Recent Labs  10/15/13 1700 10/15/13 1807 10/16/13 0430  WBC 12.9*  --  14.6*  HGB 9.2* 9.9* 8.4*  HCT 27.4* 29.0* 25.1*  PLT 95*  --  86*   BMET:  Recent Labs  10/15/13 0228  10/15/13 1807 10/16/13 0430  NA 141  --  138 138  K 4.2  --  3.6* 4.1  CL 106  --  96 101  CO2 23  --   --  28  GLUCOSE 102*  --  125* 119*  BUN 16  --  14 17  CREATININE 0.67  < > 0.80 0.66  CALCIUM 7.8*  --   --  8.0*  < > = values in this interval not displayed.  PT/INR:  Recent Labs  10/14/13 1259  LABPROT 18.2*  INR 1.51*   ABG    Component Value Date/Time   PHART 7.386 10/14/2013 2256   HCO3 25.6* 10/14/2013 2256   TCO2 25 10/15/2013 1807   ACIDBASEDEF 1.0 10/14/2013 1251   O2SAT 98.0 10/14/2013  2256   CBG (last 3)   Recent Labs  10/16/13 0011 10/16/13 0423 10/16/13 1147  GLUCAP 152* 117* 131*    Assessment/Plan: S/P Procedure(s) (LRB): AORTIC VALVE REPLACEMENT (AVR) (N/A) INTRAOPERATIVE TRANSESOPHAGEAL ECHOCARDIOGRAM (N/A) Plan for transfer to step-down: see transfer orders   LOS: 2 days    VAN TRIGT III,Delane Wessinger 10/16/2013

## 2013-10-17 ENCOUNTER — Inpatient Hospital Stay (HOSPITAL_COMMUNITY): Payer: Medicare PPO

## 2013-10-17 DIAGNOSIS — Z952 Presence of prosthetic heart valve: Secondary | ICD-10-CM

## 2013-10-17 LAB — BASIC METABOLIC PANEL
Anion gap: 15 (ref 5–15)
BUN: 21 mg/dL (ref 6–23)
CO2: 25 mEq/L (ref 19–32)
Calcium: 8 mg/dL — ABNORMAL LOW (ref 8.4–10.5)
Chloride: 101 mEq/L (ref 96–112)
Creatinine, Ser: 0.74 mg/dL (ref 0.50–1.10)
GFR calc Af Amer: >90 mL/min (ref >90)
GFR calc non Af Amer: 81 mL/min — ABNORMAL LOW (ref >90)
Glucose, Bld: 95 mg/dL (ref 70–99)
Potassium: 4.3 mEq/L (ref 3.7–5.3)
Sodium: 141 mEq/L (ref 137–147)

## 2013-10-17 LAB — GLUCOSE, CAPILLARY: GLUCOSE-CAPILLARY: 102 mg/dL — AB (ref 70–99)

## 2013-10-17 LAB — CBC
HCT: 23.7 % — ABNORMAL LOW (ref 36.0–46.0)
Hemoglobin: 8.1 g/dL — ABNORMAL LOW (ref 12.0–15.0)
MCH: 30 pg (ref 26.0–34.0)
MCHC: 34.2 g/dL (ref 30.0–36.0)
MCV: 87.8 fL (ref 78.0–100.0)
Platelets: 136 10*3/uL — ABNORMAL LOW (ref 150–400)
RBC: 2.7 MIL/uL — ABNORMAL LOW (ref 3.87–5.11)
RDW: 14.7 % (ref 11.5–15.5)
WBC: 14.3 10*3/uL — ABNORMAL HIGH (ref 4.0–10.5)

## 2013-10-17 MED ORDER — FE FUMARATE-B12-VIT C-FA-IFC PO CAPS
1.0000 | ORAL_CAPSULE | Freq: Three times a day (TID) | ORAL | Status: DC
  Administered 2013-10-17 – 2013-10-18 (×3): 1 via ORAL
  Filled 2013-10-17 (×7): qty 1

## 2013-10-17 NOTE — Progress Notes (Addendum)
      [address]       [place] [address]             [phone]      3 Days Post-Op Procedure(s) (LRB): AORTIC VALVE REPLACEMENT (AVR) (N/A) INTRAOPERATIVE TRANSESOPHAGEAL ECHOCARDIOGRAM (N/A)  Subjective:  Ms. Bunnell has no complaints this morning.  She looks great and is progressing well.  She is ambulating and has moved her bowels  Objective: Vital signs in last 24 hours: Temp:  [98.6 F (37 C)-99.5 F (37.5 C)] 98.8 F (37.1 C) (07/12 0422) Pulse Rate:  [64-78] 71 (07/12 0422) Cardiac Rhythm:  [-] Normal sinus rhythm;Bundle branch block (07/11 2022) Resp:  [15-29] 20 (07/12 0422) BP: (117-137)/(40-50) 134/45 mmHg (07/12 0422) SpO2:  [88 %-98 %] 93 % (07/12 0422) Weight:  [172 lb 6.4 oz (78.2 kg)] 172 lb 6.4 oz (78.2 kg) (07/12 0424)  Intake/Output from previous day: 07/11 0701 - 07/12 0700 In: 840 [P.O.:840] Out: 255 [Urine:255]  General appearance: alert, cooperative and no distress Heart: regular rate and rhythm Lungs: clear to auscultation bilaterally Abdomen: soft, non-tender; bowel sounds normal; no masses,  no organomegaly Extremities: edema trace Wound: clean and dry  Lab Results:  Recent Labs  10/16/13 0430 10/17/13 0400  WBC 14.6* 14.3*  HGB 8.4* 8.1*  HCT 25.1* 23.7*  PLT 86* 136*   BMET:  Recent Labs  10/16/13 0430 10/17/13 0400  NA 138 141  K 4.1 4.3  CL 101 101  CO2 28 25  GLUCOSE 119* 95  BUN 17 21  CREATININE 0.66 0.74  CALCIUM 8.0* 8.0*    PT/INR:  Recent Labs  10/14/13 1259  LABPROT 18.2*  INR 1.51*   ABG    Component Value Date/Time   PHART 7.386 10/14/2013 2256   HCO3 25.6* 10/14/2013 2256   TCO2 25 10/15/2013 1807   ACIDBASEDEF 1.0 10/14/2013 1251   O2SAT 98.0 10/14/2013 2256   CBG (last 3)   Recent Labs  10/16/13 1751 10/16/13 2147 10/17/13 0633  GLUCAP 131* 129* 102*    Assessment/Plan: S/P Procedure(s) (LRB): AORTIC VALVE REPLACEMENT (AVR) (N/A) INTRAOPERATIVE TRANSESOPHAGEAL  ECHOCARDIOGRAM (N/A)  1. CV- NSR, occasional Bradycardia- continue to hold Beta Blocker 2. Pulm- no acute issues, off oxygen encouraged use of IS 3. Renal- creatinine remains stable, volume status improving continue Lasix 4. Expected Acute Blood Loss Anemia- Hgb down to 8.1 will start Iron supplement 5. CBGs controlled, not a diabetic will d/c fingersticks 6. Dispo- patient doing very well, will d/c EPW, likely home in AM  LOS: 3 days    BARRETT, ERIN 10/17/2013  patient examined and medical record reviewed,agree with above note. VAN TRIGT III,Colby Reels 10/17/2013

## 2013-10-17 NOTE — Progress Notes (Signed)
Patient did not walk to walk this morning after her pacing wires were pulled. Will continue to monitor closely and try another walk after lunch. Glade Nurse, RN

## 2013-10-17 NOTE — Progress Notes (Signed)
Patient ambulated 550 feet on RA. Patient tolerated well. Returned to bed with call bell within reach. Glade Nurse, RN

## 2013-10-17 NOTE — Discharge Instructions (Signed)
Aortic Valve Replacement Care After Refer to this sheet in the next few weeks. These instructions provide you with information on caring for yourself after your procedure. Your health care provider may also give you specific instructions. Your treatment has been planned according to current medical practices, but problems sometimes occur. Call your health care provider if you have any problems or questions after your procedure. HOME CARE INSTRUCTIONS   Only take over-the-counter or prescription medicines as directed by your health care provider.  If your health care provider has prescribed elastic stockings, wear them as directed.  Take frequent naps or rest often throughout the day.  Avoid lifting over 10 lbs (4.5 kg) or pushing or pulling things with your arms for 6-8 weeks or as directed by your health care provider.  Avoid driving or airplane travel for 4-6 weeks after surgery or as directed. If you are riding in a car for an extended period, stop every 1-2 hours to stretch your legs. Keep a record of your medicines and medical history with you when traveling. Do not drive while taking pain medicines (narcotics).  Do not cross your legs.  Do not use any tobacco products including cigarettes, chewing tobacco, or electronic cigarettes.  Do not bathe, swim, or use a hot tub until directed by your health care provider. Take showers once your health care provider approves. Pat incisions dry. Do not rub incisions with a washcloth or towel.  Avoid climbing stairs and using the handrail to pull yourself up for the first 2-3 weeks after surgery.  Return to work as directed by your health care provider.  Drink enough fluids to keep your urine clear or pale yellow.  Do not strain to have a bowel movement. Eat high-fiber foods if you become constipated. You may also take a medicine to help you have a bowel movement (laxative) as directed by your health care provider.  Resume sexual activity as  directed by your health care provider. Men should not use medicines for erectile dysfunction until their doctor says it isokay.  If you had a certain type of heart condition in the past, you may need to take antibiotic medicine before having dental work or surgery. Let your dentist and health care providers know if you had one or more of the following:  Previous endocarditis.  An artificial (prosthetic) heart valve.  Congenital heart disease. SEEK MEDICAL CARE IF:  You develop a skin rash.   You experience sudden changes in your weight. SEEK IMMEDIATE MEDICAL CARE IF:   You develop chest pain that is not coming from your incision.   You develop shortness of breath or have difficulty breathing.   You have a fever.   You have increased bleeding from your wounds.   You have increasing wound pain.   You have redness or swelling around your wounds  You have pus coming from your wound.   You develop lightheadedness.  MAKE SURE YOU:   Understand these directions.  Will watch your condition.  Will get help right away if you are not doing well or get worse. Document Released: 10/11/2004 Document Revised: 03/30/2013 Document Reviewed: 01/07/2012 [facility] Patient Information 2015 [place], [place]. This information is not intended to replace advice given to you by your health care provider. Make sure you discuss any questions you have with your health care provider.

## 2013-10-17 NOTE — Progress Notes (Signed)
Patient ambulated 550 feet independently on RA. Patient tolerated well. Returned to bed. Call bell within reach. Will continue to monitor.   Domingo Dimes RN

## 2013-10-17 NOTE — Progress Notes (Signed)
Patient ambulated 200 feet in the hallway on RA. Patient tolerated well, yet states "I feel fatigued today". Will continue to monitor. Resting in chair with call bell within reach. Glade Nurse, RN

## 2013-10-17 NOTE — Discharge Summary (Signed)
Physician Discharge Summary  Patient ID: ALAYSIA LIGHTLE MRN: [id number] DOB/AGE: 12-Jun-1940 73 y.o.  Admit date: 10/14/2013 Discharge date: 10/18/2013  Admission Diagnoses:  Patient Active Problem List   Diagnosis Date Noted  . S/P aortic valve replacement 10/17/2013  . 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 SOB (shortness of breath) 08/18/2013  . Aortic stenosis   . Coronary artery disease   . Essential hypertension, benign 08/04/2013   Discharge Diagnoses:   Patient Active Problem List   Diagnosis Date Noted  . S/P aortic valve replacement 10/17/2013  . 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 SOB (shortness of breath) 08/18/2013  . Aortic stenosis   . Coronary artery disease   . Essential hypertension, benign 08/04/2013   Discharged Condition: good  History of Present Illness:   [name] is a 73 yo white female with known history of Aortic Stenosis, Hypertension, and Hypercholesterolemia.  She presented to her PCP with a 6 month complaint of shortness of breath with exertion.  She underwent Echocardiogram on 09/03/13 which showed severe AS with a mean gradient of 74 and peak of 121.  Her Aortic Valve Area of 0.83 cm2.  Her LV function was preserved.  She underwent further workup with diagnostic cath which did not showed normal coronary arteries with mild pulmonary HTN.  The patient was referred to [facility] to further evaluation for possible Aortic Valve Replacement.  She was evaluated by Dr. Cyndia Bent on 10/01/2013 at which time he felt she would benefit from an Aortic Valve Replacement.  The risks and benefits of the procedure were explained to the patient and she was agreeable to proceed.  Hospital Course:   The patient [facility].  She was taken to the operating room and underwent Aortic Valve Replacement utilizing a 23 mm [facility] Ease Pericardial Tissue Valve.  She tolerated the procedure well and was taken  to the SICU in stable condition.  She was extubated the evening of surgery.  During her stay in the SICU the patient was weaned off Neo Synephrine as tolerated.  Her hemoglobin was low and she required transfusion of packed red cells.  Her chest tubes and arterial lines were removed without difficulty.  She was maintaining NSR and felt medically stable for transfer to the step down unit.  The patient continues to progress.  She remains in NSR and her pacing wires have been removed.  She has had some bradycardia and will therefore not be started on a Beta Blocker.  She is ambulating without difficulty.  She is tolerating a heart health diet.  Should no issues arise we anticipate discharge home on 10/18/2013.      Consults: None  Significant Diagnostic Studies: cardiac graphics:   Echocardiogram:   - Left ventricle: The cavity size was normal. Wall thickness was increased in a pattern of mild LVH. Systolic function was normal. The estimated ejection fraction was in the range of 60% to 65%. Wall motion was normal; there were no regional wall motion abnormalities. Doppler parameters are consistent with abnormal left ventricular relaxation (grade 1 diastolic dysfunction). - Aortic valve: Probably trileaflet; severely calcified leaflets. There was severe stenosis. There was trivial regurgitation. Mean gradient (S): 74 mm Hg. Peak gradient (S): 121 mm Hg. Valve area (VTI): 0.83 cm^2. - Mitral valve: Mildly calcified annulus. There was trivial regurgitation. - Left atrium: The atrium was mildly dilated. - Right ventricle: The cavity size was normal. Systolic function  was normal. - Tricuspid valve: Peak RV-RA gradient (S): 29 mm Hg. - Pulmonary arteries: PA peak pressure: 32 mm Hg (S). - Inferior vena cava: The vessel was normal in size. The respirophasic diameter changes were in the normal range (= 50%), consistent with normal central venous pressure. - Pericardium, extracardiac: A trivial  pericardial effusion was identified.  Treatments: surgery:   1. Median Sternotomy 2. Extracorporeal circulation       3. Aortic valve replacement using a 23 mm [employer] Magna-Ease pericardial valve.  Disposition: 01-Home or Self Care  Discharge Medications:   Medication List    STOP taking these medications       hydrochlorothiazide 25 MG tablet  Commonly known as:  HYDRODIURIL      TAKE these medications       aspirin 81 MG tablet  Take 81 mg by mouth every other day.     ferrous sulfate 325 (65 FE) MG tablet  Take 1 tablet (325 mg total) by mouth daily with breakfast. For one month then stop.     furosemide 40 MG tablet  Commonly known as:  LASIX  Take 1 tablet (40 mg total) by mouth daily. For 5 days then stop     lisinopril 10 MG tablet  Commonly known as:  PRINIVIL,ZESTRIL  Take 10 mg by mouth daily.     potassium chloride 10 MEQ tablet  Commonly known as:  K-DUR,KLOR-CON  Take 2 tablets (20 mEq total) by mouth daily. For 5 days then take potassium chloride 10 meq by mouth daily therafter     traMADol 50 MG tablet  Commonly known as:  ULTRAM  Take 1 tablet (50 mg total) by mouth every 6 (six) hours as needed for moderate pain.     Vitamin D 2000 UNITS tablet  Take 2,000 Units by mouth every other day.       The patient has been discharged on:   1.Beta Blocker:  Yes [   ]                              No   [ x  ]                              If No, reason: Bradycardia  2.Ace Inhibitor/ARB: Yes [  x ]                                     No  [   ]                                     If No, reason:   3.Statin:   Yes [   ]                  No  [ x  ]                  If No, reason: No CAD  4.Shela CommonsLauro Regulus   ]                  No   [   ]  If No, reason:      Follow-up Information   Follow up with Gaye Pollack, MD In 4 weeks. (Office will contact you with appointment)    Specialty:  Cardiothoracic Surgery   Contact  information:   [place] [id number] [phone]       Follow up with [facility] In 4 weeks. (Please get CXR 1 hour prior to your appointment with Dr. Cyndia Bent)    Contact information:   [place]       Follow up with Sueanne Margarita, MD. (Call for appointment time for 2 weeks)    Specialty:  Cardiology   Contact information:   [address] [place]  [address] [phone]       Follow up with [facility] On 10/29/2013. (Office will contact you with appointment time for chest tube suture removal)       Follow up On 10/22/2013.      SignedNani Skillern PA-C 10/18/2013, 7:50 AM

## 2013-10-17 NOTE — Progress Notes (Signed)
Removed EPW per MD order per hospital policy. Pacing wires intact upon removal, slight drainage to the right side - applied guaze to site. Patient tolerated well. Advised to remain in bed for 1 hour. Will continue to monitor closely. Glade Nurse, RN

## 2013-10-17 NOTE — Progress Notes (Signed)
Chaplain received referral from overnight chaplain that pt requested chaplain visit on Sunday morning since she could not attend church. Patient was sitting up in bedside recliner. She identifies as a Market researcher, active [employer] and described a strong family and church support system. Patient expressed eagerness to be discharged and go home, where she lives with her husband. Chaplain provided pastoral presence, empathic listening, emotional support, and prayer. She appreciated visit.   Ethelene Browns [phone]

## 2013-10-18 MED ORDER — FERROUS SULFATE 325 (65 FE) MG PO TABS: 325.0000 mg | ORAL_TABLET | Freq: Every day | ORAL | Status: DC

## 2013-10-18 MED ORDER — TRAMADOL HCL 50 MG PO TABS: 50.0000 mg | ORAL_TABLET | Freq: Four times a day (QID) | ORAL | Status: DC | PRN

## 2013-10-18 MED ORDER — ASPIRIN 325 MG PO TBEC: 325.0000 mg | DELAYED_RELEASE_TABLET | Freq: Every day | ORAL | Status: DC

## 2013-10-18 MED ORDER — POTASSIUM CHLORIDE CRYS ER 10 MEQ PO TBCR: 20.0000 meq | EXTENDED_RELEASE_TABLET | Freq: Every day | ORAL | Status: DC

## 2013-10-18 MED ORDER — FUROSEMIDE 40 MG PO TABS: 40.0000 mg | ORAL_TABLET | Freq: Every day | ORAL | Status: DC

## 2013-10-18 NOTE — Care Management Note (Signed)
    Page 1 of 1   10/18/2013     11:31:44 AM CARE MANAGEMENT NOTE 10/18/2013  Patient:  [name], [name]   Account Number:  [id number]  Date Initiated:  10/18/2013  Documentation initiated by:  Jennafer Gladue  Subjective/Objective Assessment:   Pt adm on 10/14/13 s/p AVR.  PTA, pt independent, lives with spouse.     Action/Plan:   Pt for dc home today; no dc needs identified.   Anticipated DC Date:  10/18/2013   Anticipated DC Plan:  [place]  CM consult      Choice offered to / List presented to:             Status of service:  Completed, signed off Medicare Important Message given?  YES (If response is "NO", the following Medicare IM given date fields will be blank) Date Medicare IM given:  10/18/2013 Medicare IM given by:  Jamileth Putzier Date Additional Medicare IM given:   Additional Medicare IM given by:    Discharge Disposition:  HOME/SELF CARE  Per UR Regulation:  Reviewed for med. necessity/level of care/duration of stay  If discussed at [place] of Stay Meetings, dates discussed:    Comments:

## 2013-10-18 NOTE — Progress Notes (Signed)
CARDIAC REHAB PHASE I   PRE:  Rate/Rhythm: 78 SR  BP:  Supine:   Sitting: 124/50  Standing:    SaO2: 96 RA  MODE:  Ambulation: 550 ft   POST:  Rate/Rhythm: 80  BP:  Supine:   Sitting: 120/56  Standing:    SaO2: 93 RA 0830-0930 Pt tolerated ambulation well with hand held assist. Gait steady. VS stable Pt able to walk 550 feet without c/o. Pt back to recliner after walk with call light in reach. Completed discharge education with pt. She voices understanding. Pt agrees to [place]. CRP in [place], will send referral.  Rodney Langton RN 10/18/2013 9:31 AM

## 2013-10-18 NOTE — Progress Notes (Signed)
Discharge medication, follow-up appts, prescriptions, and after care reviewed with pt and pts sister, both stated understanding and that they had no questions, pt awaiting husband for pickup Rickard Rhymes, RN

## 2013-10-18 NOTE — Progress Notes (Addendum)
      [address]       [place] [address]             [phone]        4 Days Post-Op Procedure(s) (LRB): AORTIC VALVE REPLACEMENT (AVR) (N/A) INTRAOPERATIVE TRANSESOPHAGEAL ECHOCARDIOGRAM (N/A)  Subjective: Patient just finished eating breakfast. She has no complaints.  Objective: Vital signs in last 24 hours: Temp:  [97.8 F (36.6 C)-99.3 F (37.4 C)] 99.2 F (37.3 C) (07/13 0524) Pulse Rate:  [63-84] 67 (07/13 0524) Cardiac Rhythm:  [-] Normal sinus rhythm;Bundle branch block (07/12 2037) Resp:  [18-19] 18 (07/13 0524) BP: (100-136)/(39-60) 136/45 mmHg (07/13 0524) SpO2:  [93 %-99 %] 94 % (07/13 0524) Weight:  [177 lb 14.6 oz (80.7 kg)] 177 lb 14.6 oz (80.7 kg) (07/13 0524)  Pre op weight 77 kg Current Weight  10/18/13 177 lb 14.6 oz (80.7 kg)      Intake/Output from previous day: 07/12 0701 - 07/13 0700 In: 360 [P.O.:360] Out: 1800 [Urine:1800]   Physical Exam:  Cardiovascular: RRR, no murmurs, gallops, or rubs. Pulmonary: Slightly diminished at bases; no rales, wheezes, or rhonchi. Abdomen: Soft, non tender, bowel sounds present. Extremities: Mild bilateral lower extremity edema. Wounds: Clean and dry.  No erythema or signs of infection.  Lab Results: CBC: Recent Labs  10/16/13 0430 10/17/13 0400  WBC 14.6* 14.3*  HGB 8.4* 8.1*  HCT 25.1* 23.7*  PLT 86* 136*   BMET:  Recent Labs  10/16/13 0430 10/17/13 0400  NA 138 141  K 4.1 4.3  CL 101 101  CO2 28 25  GLUCOSE 119* 95  BUN 17 21  CREATININE 0.66 0.74  CALCIUM 8.0* 8.0*    PT/INR:  Lab Results  Component Value Date   INR 1.51* 10/14/2013   INR 0.95 10/12/2013   INR 1.0 09/10/2013   ABG:  INR: Will add last result for INR, ABG once components are confirmed Will add last 4 CBG results once components are confirmed  Assessment/Plan:  1. CV - Previous bradycardia so not on BB.SR in the 70's this am. On Lisinopril 10 daily pre op. Will restart at discharge for bp  control. 2.  Pulmonary - Encourage incentive spirometer 3. Volume Overload - On Lasix 40 daily. Will give for several days at discharge then stop. 4.  Acute blood loss anemia - Last H and H 8.1 and 23.7. Continue Trinsicon 5. Mild thrombocytopenia-last platelets 136,000. Will resume baby ecasa as taken pre op at discharge 6. Chest tube sutures to remain 7. Discharge later this am   ZIMMERMAN,DONIELLE MPA-C 10/18/2013,7:31 AM

## 2013-10-18 NOTE — Progress Notes (Signed)
Discharge education reviewed with pts husband for emphasis, IV and tele removed, pt getting dressed Rickard Rhymes, RN

## 2013-10-19 ENCOUNTER — Telehealth: Payer: Self-pay | Admitting: *Deleted

## 2013-10-19 NOTE — Telephone Encounter (Signed)
Ok to refill ferrous sulfate for patient? Please advise. Thanks, MI

## 2013-10-19 NOTE — Telephone Encounter (Signed)
Already been refilled by another provider. We do not refill iron supplments. Thanks

## 2013-10-22 ENCOUNTER — Ambulatory Visit (INDEPENDENT_AMBULATORY_CARE_PROVIDER_SITE_OTHER): Payer: Self-pay | Admitting: *Deleted

## 2013-10-22 VITALS — BP 145/61 | HR 73 | Resp 16 | Ht 60.0 in | Wt 179.0 lb

## 2013-10-22 DIAGNOSIS — Z952 Presence of prosthetic heart valve: Secondary | ICD-10-CM

## 2013-10-22 DIAGNOSIS — Z4802 Encounter for removal of sutures: Secondary | ICD-10-CM

## 2013-10-22 DIAGNOSIS — I2510 Atherosclerotic heart disease of native coronary artery without angina pectoris: Secondary | ICD-10-CM

## 2013-10-22 DIAGNOSIS — I350 Nonrheumatic aortic (valve) stenosis: Secondary | ICD-10-CM

## 2013-10-22 NOTE — Progress Notes (Signed)
Mrs. Betton returns s/p AVR for the removal of 2 chest tube sutures.  She has residual bruising of her chest wall and around the site of her chest tube sites. The sternal incision and chest tube sites are very well healed.  The chest tubes were removed easily.  There was some old bloody drainage from one of them and I covered the area with A 2X2 dressing for today. She is complaining of dizziness today that began when she started getting out of bed this morning. It slowly eased as she moved around and had breakfast.  She is still somewhat dizzy now and the left side of her head feels full and she has a slight headache. V.S. are stable, pulse is regular and breath sounds are clear with no ankle edema.  I will discuss the issue with Dr. Cyndia Bent and call her with his advice.

## 2013-10-28 LAB — POCT I-STAT, CHEM 8
BUN: 7 mg/dL (ref 6–23)
CALCIUM ION: 1.13 mmol/L (ref 1.13–1.30)
Chloride: 101 mEq/L (ref 96–112)
Creatinine, Ser: 0.7 mg/dL (ref 0.50–1.10)
GLUCOSE: 97 mg/dL (ref 70–99)
HEMATOCRIT: 32 % — AB (ref 36.0–46.0)
HEMOGLOBIN: 10.9 g/dL — AB (ref 12.0–15.0)
Potassium: 3.5 mEq/L — ABNORMAL LOW (ref 3.7–5.3)
Sodium: 140 mEq/L (ref 137–147)
TCO2: 22 mmol/L (ref 0–100)

## 2013-11-10 ENCOUNTER — Encounter: Payer: Self-pay | Admitting: Physician Assistant

## 2013-11-10 ENCOUNTER — Ambulatory Visit (INDEPENDENT_AMBULATORY_CARE_PROVIDER_SITE_OTHER): Payer: Medicare PPO | Admitting: Physician Assistant

## 2013-11-10 VITALS — BP 140/80 | HR 70 | Ht 60.0 in | Wt 163.0 lb

## 2013-11-10 DIAGNOSIS — I6529 Occlusion and stenosis of unspecified carotid artery: Secondary | ICD-10-CM

## 2013-11-10 DIAGNOSIS — I350 Nonrheumatic aortic (valve) stenosis: Secondary | ICD-10-CM

## 2013-11-10 DIAGNOSIS — E876 Hypokalemia: Secondary | ICD-10-CM

## 2013-11-10 DIAGNOSIS — Z954 Presence of other heart-valve replacement: Secondary | ICD-10-CM

## 2013-11-10 DIAGNOSIS — Z952 Presence of prosthetic heart valve: Secondary | ICD-10-CM

## 2013-11-10 DIAGNOSIS — I359 Nonrheumatic aortic valve disorder, unspecified: Secondary | ICD-10-CM

## 2013-11-10 DIAGNOSIS — I10 Essential (primary) hypertension: Secondary | ICD-10-CM

## 2013-11-10 NOTE — Progress Notes (Signed)
Cardiology Office Note    Date:  11/10/2013   ID:  [name], DOB 01-25-1941, MRN [id number]  PCP:  Horton Finer, MD  Cardiologist:  Dr. Fransico Him      History of Present Illness: [name] is a 73 y.o. female with a history of HTN and aortic stenosis. Recent echocardiogram demonstrated progression of aortic stenosis to severe. Cardiac catheterization was arranged and demonstrated normal coronary arteries. She was referred to [facility] for aortic valve replacement. She was admitted 7/9-7/13. She underwent AVR with a bioprosthetic valve by Dr. Cyndia Bent.  Postoperative course was fairly uneventful. She did require transfusion with PRBCs due to blood loss anemia. She remained in sinus rhythm. Of note, presurgical Dopplers did demonstrate carotid stenosis.  She is doing well. Chest remains somewhat sore. She denies significant dyspnea. She denies orthopnea, PND or edema. She denies syncope. She denies fevers or cough  Studies:  - LHC (6/15):  Normal coronary arteries  - Echo (5/15):  Mild LVH, EF 60-65%, normal wall motion, grade 1 diastolic dysfunction, severe aortic stenosis (mean gradient 74 mm Hg), MAC, trivial MR, mild LAE, normal RV function, PASP 32 mm Hg, trivial effusion  - Carotid US (7/15):  R  60-79%;  L  1-39%   Recent Labs/Images: 09/23/2013: Pro B Natriuretic peptide (BNP) 177.0  10/12/2013: ALT 28  10/21/2013: Creatinine 0.70; Hemoglobin 10.9*; Potassium 3.5*   Dg Chest 2 View  10/12/2013   CLINICAL DATA:  Preop cardiac surgery  EXAM: CHEST  2 VIEW  COMPARISON:  09/22/2013  FINDINGS: The heart size and mediastinal contours are within normal limits. Both lungs are clear. The visualized skeletal structures are unremarkable.  IMPRESSION: No active cardiopulmonary disease.   Electronically Signed   By: Franchot Gallo M.D.   On: 10/12/2013 12:52     Wt Readings from Last 3 Encounters:  11/10/13 163 lb (73.936 kg)  10/22/13 179 lb (81.194 kg)  10/18/13 177 lb 14.6 oz  (80.7 kg)     Past Medical History  Diagnosis Date  . Obesity   . Abnormal vaginal Pap smear 1994    annual paps for years after that.more recently every other year,last in 2012-we agreed not to do them anymore  . Hypercholesteremia   . Osteopenia     declines treatment  . Aortic stenosis   . Pneumonia 1995  . Vitamin D insufficiency   . Shoulder pain     Due to arthritis  . Hypertension   . Coronary artery disease 2008    nonobstructive ASCAD with 30% LAD  . Heart murmur   . Anxiety     no rx    Current Outpatient Prescriptions  Medication Sig Dispense Refill  . aspirin 81 MG tablet Take 81 mg by mouth every other day.       . Cholecalciferol (VITAMIN D) 2000 UNITS tablet Take 2,000 Units by mouth every other day.      . ferrous sulfate 325 (65 FE) MG tablet Take 1 tablet (325 mg total) by mouth daily with breakfast. For one month then stop.    3  . furosemide (LASIX) 40 MG tablet Take 1 tablet (40 mg total) by mouth daily. For 5 days then stop  5 tablet  0  . lisinopril (PRINIVIL,ZESTRIL) 10 MG tablet Take 10 mg by mouth daily.      . potassium chloride (K-DUR,KLOR-CON) 10 MEQ tablet Take 2 tablets (20 mEq total) by mouth daily. For 5 days then take potassium chloride  10 meq by mouth daily therafter  30 tablet  0   No current facility-administered medications for this visit.     Allergies:   Lipitor; Pravastatin; Simvastatin; Tramadol; Codeine; Penicillins; and Sulfa antibiotics   Social History:  The patient  reports that she has never smoked. She does not have any smokeless tobacco history on file. She reports that she does not drink alcohol or use illicit drugs.   Family History:  The patient's family history includes Arrhythmia in her sister; Dementia in her mother; Heart attack in her brother, father, and maternal grandfather; Heart disease in her brother and father.   ROS:  Please see the history of present illness.   She has had some loose stools.   All other  systems reviewed and negative.   PHYSICAL EXAM: VS:  BP 140/80  Pulse 70  Ht 5' (1.524 m)  Wt 163 lb (73.936 kg)  BMI 31.83 kg/m2 Well nourished, well developed, in no acute distress HEENT: normal Neck: no JVD Cardiac:  normal S1, S2; RRR; 2/6 systolic ejection murmur heard best at the right upper sternal border Lungs:  clear to auscultation bilaterally, no wheezing, rhonchi or rales Abd: soft, nontender, no hepatomegaly Ext: no edema Skin: warm and dry Neuro:  CNs 2-12 intact, no focal abnormalities noted  EKG:  NSR, HR 70, normal axis, incomplete RBBB     ASSESSMENT AND PLAN:  Aortic stenosis S/P AVR:  She is doing well after recent aortic valve replacement. She is already set to start cardiac rehabilitation.  Arrange followup echocardiogram. We discussed the importance of SBE prophylaxis.  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  Hypokalemia:  She was kept on potassium supplementation. Previously she had hypokalemia in the setting of HCTZ. She is currently off this medication. I have asked her to stop her potassium supplement. Check a basic metabolic panel in one week.  Essential hypertension, benign:  His blood pressure increases, plan increasing ACE inhibitor.   Disposition:  Follow up with Dr. Radford Pax and 2 months.   Signed, Versie Starks, MHS 11/10/2013 12:17 PM    [facility] [address], [place], [place]  [address] Phone: [phone]; Fax: [phone]

## 2013-11-10 NOTE — Patient Instructions (Signed)
Your physician has recommended you make the following change in your medication:  1) STOP Potassium  Take all other medications as prescribed  Your physician recommends that you return for lab work in: 1-2 weeks (Bmet)  Your physician has requested that you have an echocardiogram. Echocardiography is a painless test that uses sound waves to create images of your heart. It provides your doctor with information about the size and shape of your heart and how well your heart's chambers and valves are working. This procedure takes approximately one hour. There are no restrictions for this procedure.  Your physician has requested that you have a carotid duplex. This test is an ultrasound of the carotid arteries in your neck. It looks at blood flow through these arteries that supply the brain with blood. Allow one hour for this exam. There are no restrictions or special instructions.( To be scheduled in Feb 2016)  Your physician recommends that you schedule a follow-up appointment in: 2 months with Dr.Turner

## 2013-11-17 ENCOUNTER — Other Ambulatory Visit (INDEPENDENT_AMBULATORY_CARE_PROVIDER_SITE_OTHER): Payer: Medicare PPO

## 2013-11-17 ENCOUNTER — Ambulatory Visit (HOSPITAL_COMMUNITY): Payer: Medicare PPO | Attending: Physician Assistant

## 2013-11-17 ENCOUNTER — Ambulatory Visit: Payer: Medicare PPO | Admitting: Surgery

## 2013-11-17 DIAGNOSIS — J189 Pneumonia, unspecified organism: Secondary | ICD-10-CM | POA: Diagnosis not present

## 2013-11-17 DIAGNOSIS — I379 Nonrheumatic pulmonary valve disorder, unspecified: Secondary | ICD-10-CM | POA: Diagnosis not present

## 2013-11-17 DIAGNOSIS — E559 Vitamin D deficiency, unspecified: Secondary | ICD-10-CM | POA: Diagnosis not present

## 2013-11-17 DIAGNOSIS — I079 Rheumatic tricuspid valve disease, unspecified: Secondary | ICD-10-CM | POA: Insufficient documentation

## 2013-11-17 DIAGNOSIS — F411 Generalized anxiety disorder: Secondary | ICD-10-CM | POA: Diagnosis not present

## 2013-11-17 DIAGNOSIS — M899 Disorder of bone, unspecified: Secondary | ICD-10-CM | POA: Insufficient documentation

## 2013-11-17 DIAGNOSIS — E785 Hyperlipidemia, unspecified: Secondary | ICD-10-CM | POA: Diagnosis not present

## 2013-11-17 DIAGNOSIS — Z954 Presence of other heart-valve replacement: Secondary | ICD-10-CM | POA: Insufficient documentation

## 2013-11-17 DIAGNOSIS — M949 Disorder of cartilage, unspecified: Secondary | ICD-10-CM | POA: Diagnosis not present

## 2013-11-17 DIAGNOSIS — I2510 Atherosclerotic heart disease of native coronary artery without angina pectoris: Secondary | ICD-10-CM | POA: Insufficient documentation

## 2013-11-17 DIAGNOSIS — R011 Cardiac murmur, unspecified: Secondary | ICD-10-CM | POA: Insufficient documentation

## 2013-11-17 DIAGNOSIS — I059 Rheumatic mitral valve disease, unspecified: Secondary | ICD-10-CM | POA: Diagnosis not present

## 2013-11-17 DIAGNOSIS — I10 Essential (primary) hypertension: Secondary | ICD-10-CM | POA: Diagnosis not present

## 2013-11-17 DIAGNOSIS — E876 Hypokalemia: Secondary | ICD-10-CM

## 2013-11-17 DIAGNOSIS — Z952 Presence of prosthetic heart valve: Secondary | ICD-10-CM

## 2013-11-17 LAB — BASIC METABOLIC PANEL
BUN: 14 mg/dL (ref 6–23)
CHLORIDE: 102 meq/L (ref 96–112)
CO2: 29 mEq/L (ref 19–32)
Calcium: 9.3 mg/dL (ref 8.4–10.5)
Creatinine, Ser: 0.7 mg/dL (ref 0.4–1.2)
GFR: 83.83 mL/min (ref >60.00)
Glucose, Bld: 119 mg/dL — ABNORMAL HIGH (ref 70–99)
Potassium: 3.3 mEq/L — ABNORMAL LOW (ref 3.5–5.1)
Sodium: 139 mEq/L (ref 135–145)

## 2013-11-17 NOTE — Progress Notes (Signed)
2D Echo completed. 11/17/2013 

## 2013-11-18 ENCOUNTER — Encounter: Payer: Self-pay | Admitting: Physician Assistant

## 2013-11-19 ENCOUNTER — Telehealth: Payer: Self-pay | Admitting: *Deleted

## 2013-11-19 DIAGNOSIS — I10 Essential (primary) hypertension: Secondary | ICD-10-CM

## 2013-11-19 DIAGNOSIS — E876 Hypokalemia: Secondary | ICD-10-CM

## 2013-11-19 MED ORDER — SPIRONOLACTONE 25 MG PO TABS: 12.5000 mg | ORAL_TABLET | Freq: Every day | ORAL | Status: DC

## 2013-11-19 NOTE — Telephone Encounter (Signed)
pt notified about lab results with verbal understanding to results as well as to starting spironolactone 12.5 mg qd, bmet 8/19, 8/26. Pt verbalized Plan of Care

## 2013-11-19 NOTE — Telephone Encounter (Signed)
pt notified about echo results as well with verbal understanding

## 2013-11-22 ENCOUNTER — Other Ambulatory Visit: Payer: Self-pay | Admitting: Surgery

## 2013-11-22 DIAGNOSIS — I359 Nonrheumatic aortic valve disorder, unspecified: Secondary | ICD-10-CM

## 2013-11-24 ENCOUNTER — Other Ambulatory Visit (INDEPENDENT_AMBULATORY_CARE_PROVIDER_SITE_OTHER): Payer: Medicare PPO

## 2013-11-24 ENCOUNTER — Encounter: Payer: Self-pay | Admitting: Surgery

## 2013-11-24 ENCOUNTER — Ambulatory Visit (INDEPENDENT_AMBULATORY_CARE_PROVIDER_SITE_OTHER): Payer: Self-pay | Admitting: Surgery

## 2013-11-24 ENCOUNTER — Ambulatory Visit
Admission: RE | Admit: 2013-11-24 | Discharge: 2013-11-24 | Disposition: A | Discharge status: Home / Self Care | Payer: Medicare PPO | Source: Ambulatory Visit | Attending: Surgery | Admitting: Surgery

## 2013-11-24 VITALS — BP 149/66 | HR 70 | Ht 60.0 in | Wt 163.0 lb

## 2013-11-24 DIAGNOSIS — Z954 Presence of other heart-valve replacement: Secondary | ICD-10-CM

## 2013-11-24 DIAGNOSIS — I10 Essential (primary) hypertension: Secondary | ICD-10-CM

## 2013-11-24 DIAGNOSIS — Z952 Presence of prosthetic heart valve: Secondary | ICD-10-CM

## 2013-11-24 DIAGNOSIS — I359 Nonrheumatic aortic valve disorder, unspecified: Secondary | ICD-10-CM

## 2013-11-24 DIAGNOSIS — E876 Hypokalemia: Secondary | ICD-10-CM

## 2013-11-24 LAB — BASIC METABOLIC PANEL
BUN: 16 mg/dL (ref 6–23)
CALCIUM: 8.9 mg/dL (ref 8.4–10.5)
CO2: 31 mEq/L (ref 19–32)
CREATININE: 0.7 mg/dL (ref 0.4–1.2)
Chloride: 102 mEq/L (ref 96–112)
GFR: 83.82 mL/min (ref >60.00)
GLUCOSE: 84 mg/dL (ref 70–99)
Potassium: 3.9 mEq/L (ref 3.5–5.1)
Sodium: 138 mEq/L (ref 135–145)

## 2013-11-24 NOTE — Progress Notes (Signed)
      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 The patient's early postoperative recovery while in the hospital was notable for an uncomplicated postop course. Since hospital discharge the patient reports that she has been feeling well and is walking daily without shortness of breath or chest pain. She says she is tired after walking but recovers.   Current Outpatient Prescriptions  Medication Sig Dispense Refill  . aspirin 81 MG tablet Take 81 mg by mouth every other day.       . Cholecalciferol (VITAMIN D) 2000 UNITS tablet Take 2,000 Units by mouth every other day.      . ferrous sulfate 325 (65 FE) MG tablet Take 1 tablet (325 mg total) by mouth daily with breakfast. For one month then stop.    3  . lisinopril (PRINIVIL,ZESTRIL) 10 MG tablet Take 10 mg by mouth daily.      Marland Kitchen spironolactone (ALDACTONE) 25 MG tablet Take 0.5 tablets (12.5 mg total) by mouth daily.  30 tablet  11   No current facility-administered medications for this visit.    Physical Exam: BP 149/66  Pulse 70  Ht 5' (1.524 m)  Wt 163 lb (73.936 kg)  BMI 31.83 kg/m2  SpO2 97% He looks well. Lung exam is clear. Cardiac exam shows a regular rate and rhythm with normal heart sounds. Chest incision is healing well and sternum is stable.   Diagnostic Tests:  CLINICAL DATA: Aortic stenosis; hypertension  EXAM:  CHEST 2 VIEW  COMPARISON: October 17, 2013  FINDINGS:  There is a minimal left effusion. Elsewhere lungs are clear. Heart  is borderline enlarged with pulmonary vascularity within normal  limits. No adenopathy. There is an aortic valve replacement. There  is atherosclerotic change in the aorta. There is degenerative change  in the thoracic spine.  IMPRESSION:  Heart borderline enlarged with aortic valve replacement. Minimal  left effusion. No lung edema or consolidation.  Electronically Signed  By: Lowella Grip M.D.  On: 11/24/2013 11:23    Impression:  Overall I think she is doing well. I encouraged her to continue walking. She is planning to participate in cardiac rehab. I told her that she could drive her car but should not lift anything heavier than 10 lbs for three months postop.   Plan:  She will continue to follow up with Dr. [name] and Dr. Radford Pax and will contact me if she develops any problems with her incisions.

## 2013-11-25 ENCOUNTER — Encounter (HOSPITAL_COMMUNITY)
Admission: RE | Admit: 2013-11-25 | Discharge: 2013-11-25 | Disposition: A | Discharge status: Home / Self Care | Payer: Medicare PPO | Source: Ambulatory Visit | Attending: Cardiology | Admitting: Cardiology

## 2013-11-25 DIAGNOSIS — I10 Essential (primary) hypertension: Secondary | ICD-10-CM | POA: Insufficient documentation

## 2013-11-25 DIAGNOSIS — Q231 Congenital insufficiency of aortic valve: Secondary | ICD-10-CM | POA: Insufficient documentation

## 2013-11-25 DIAGNOSIS — Z5189 Encounter for other specified aftercare: Secondary | ICD-10-CM | POA: Insufficient documentation

## 2013-11-25 DIAGNOSIS — Z954 Presence of other heart-valve replacement: Secondary | ICD-10-CM | POA: Insufficient documentation

## 2013-11-25 DIAGNOSIS — I359 Nonrheumatic aortic valve disorder, unspecified: Secondary | ICD-10-CM | POA: Insufficient documentation

## 2013-11-25 DIAGNOSIS — I6529 Occlusion and stenosis of unspecified carotid artery: Secondary | ICD-10-CM | POA: Insufficient documentation

## 2013-11-25 NOTE — Progress Notes (Signed)
Cardiac Rehab Medication Review by a Pharmacist  Does the patient  feel that his/her medications are working for him/her?  yes  Has the patient been experiencing any side effects to the medications prescribed?  no  Does the patient measure his/her own blood pressure or blood glucose at home?  Occasionally, not on regular basis   Does the patient have any problems obtaining medications due to transportation or finances?   no  Understanding of regimen: excellent Understanding of indications: excellent Potential of compliance: excellent    Pharmacist comments: Patient has excellent understanding of medication regimen and is compliant. No issues   Elicia Lamp, PharmD Clinical Pharmacist - Resident Pager [phone] 11/25/2013 8:18 AM

## 2013-11-29 ENCOUNTER — Encounter (HOSPITAL_COMMUNITY)
Admission: RE | Admit: 2013-11-29 | Discharge: 2013-11-29 | Disposition: A | Discharge status: Home / Self Care | Payer: Medicare PPO | Source: Ambulatory Visit | Attending: Cardiology | Admitting: Cardiology

## 2013-11-29 DIAGNOSIS — I359 Nonrheumatic aortic valve disorder, unspecified: Secondary | ICD-10-CM | POA: Diagnosis present

## 2013-11-29 DIAGNOSIS — I6529 Occlusion and stenosis of unspecified carotid artery: Secondary | ICD-10-CM | POA: Diagnosis not present

## 2013-11-29 DIAGNOSIS — Z954 Presence of other heart-valve replacement: Secondary | ICD-10-CM | POA: Diagnosis not present

## 2013-11-29 DIAGNOSIS — I10 Essential (primary) hypertension: Secondary | ICD-10-CM | POA: Diagnosis not present

## 2013-11-29 DIAGNOSIS — Q231 Congenital insufficiency of aortic valve: Secondary | ICD-10-CM | POA: Diagnosis not present

## 2013-11-29 DIAGNOSIS — Z5189 Encounter for other specified aftercare: Secondary | ICD-10-CM | POA: Diagnosis not present

## 2013-11-29 NOTE — Progress Notes (Signed)
Pt in today for her first day of exercise at the 11:15 exercise class time in cardiac rehab phase II. Pt tolerated light exercise with no complaints.  Monitor showed sr with no noted ectopy.  Medication list reconciled.  PHQ2 score 0.  Pt feels positive about her recovery and feels supportive by her family.  Pt reports short term goal is to learn about exercise.  Will continue to review home exercise and advise pt to attend the exercising on your own class that is featured this Wednesday.  Pt long term goal is to recover from surgery and learnt to keep weight down.  Encourage pt to attend the nutritional classes offered on Tuesday taught by the dietician. Continue to monitor her progress toward achieving these goals.Cherre Huger, BSN

## 2013-12-01 ENCOUNTER — Encounter (HOSPITAL_COMMUNITY)
Admission: RE | Admit: 2013-12-01 | Discharge: 2013-12-01 | Disposition: A | Discharge status: Home / Self Care | Payer: Medicare PPO | Source: Ambulatory Visit | Attending: Cardiology | Admitting: Cardiology

## 2013-12-01 ENCOUNTER — Other Ambulatory Visit (INDEPENDENT_AMBULATORY_CARE_PROVIDER_SITE_OTHER): Payer: Medicare PPO

## 2013-12-01 DIAGNOSIS — E876 Hypokalemia: Secondary | ICD-10-CM

## 2013-12-01 DIAGNOSIS — I10 Essential (primary) hypertension: Secondary | ICD-10-CM

## 2013-12-01 DIAGNOSIS — Z5189 Encounter for other specified aftercare: Secondary | ICD-10-CM | POA: Diagnosis not present

## 2013-12-01 LAB — BASIC METABOLIC PANEL
BUN: 13 mg/dL (ref 6–23)
CO2: 28 mEq/L (ref 19–32)
Calcium: 9 mg/dL (ref 8.4–10.5)
Chloride: 102 mEq/L (ref 96–112)
Creatinine, Ser: 0.8 mg/dL (ref 0.4–1.2)
GFR: 77.57 mL/min (ref >60.00)
Glucose, Bld: 96 mg/dL (ref 70–99)
Potassium: 4 mEq/L (ref 3.5–5.1)
Sodium: 137 mEq/L (ref 135–145)

## 2013-12-02 ENCOUNTER — Telehealth: Payer: Self-pay | Admitting: *Deleted

## 2013-12-02 DIAGNOSIS — I10 Essential (primary) hypertension: Secondary | ICD-10-CM

## 2013-12-02 NOTE — Telephone Encounter (Signed)
lmptcb for lab results and to schedule repeat bmet to be done in 1 month

## 2013-12-03 ENCOUNTER — Encounter (HOSPITAL_COMMUNITY)
Admission: RE | Admit: 2013-12-03 | Discharge: 2013-12-03 | Disposition: A | Discharge status: Home / Self Care | Payer: Medicare PPO | Source: Ambulatory Visit | Attending: Cardiology | Admitting: Cardiology

## 2013-12-03 DIAGNOSIS — Z5189 Encounter for other specified aftercare: Secondary | ICD-10-CM | POA: Diagnosis not present

## 2013-12-06 ENCOUNTER — Encounter (HOSPITAL_COMMUNITY)
Admission: RE | Admit: 2013-12-06 | Discharge: 2013-12-06 | Disposition: A | Discharge status: Home / Self Care | Payer: Medicare PPO | Source: Ambulatory Visit | Attending: Cardiology | Admitting: Cardiology

## 2013-12-06 DIAGNOSIS — Z5189 Encounter for other specified aftercare: Secondary | ICD-10-CM | POA: Diagnosis not present

## 2013-12-06 NOTE — Progress Notes (Signed)
Reviewed home exercise with pt today.  Pt plans to walk at home for exercise.  Reviewed THR, pulse, RPE, sign and symptoms, and when to call 911 or MD.  Pt voiced understanding. Makyi Ledo, MA, ACSM RCEP   

## 2013-12-08 ENCOUNTER — Encounter (HOSPITAL_COMMUNITY)
Admission: RE | Admit: 2013-12-08 | Discharge: 2013-12-08 | Disposition: A | Discharge status: Home / Self Care | Payer: Medicare PPO | Source: Ambulatory Visit | Attending: Cardiology | Admitting: Cardiology

## 2013-12-08 DIAGNOSIS — Q231 Congenital insufficiency of aortic valve: Secondary | ICD-10-CM | POA: Diagnosis not present

## 2013-12-08 DIAGNOSIS — I359 Nonrheumatic aortic valve disorder, unspecified: Secondary | ICD-10-CM | POA: Insufficient documentation

## 2013-12-08 DIAGNOSIS — Z5189 Encounter for other specified aftercare: Secondary | ICD-10-CM | POA: Insufficient documentation

## 2013-12-08 DIAGNOSIS — Z954 Presence of other heart-valve replacement: Secondary | ICD-10-CM | POA: Insufficient documentation

## 2013-12-08 DIAGNOSIS — I10 Essential (primary) hypertension: Secondary | ICD-10-CM | POA: Diagnosis not present

## 2013-12-08 DIAGNOSIS — I6529 Occlusion and stenosis of unspecified carotid artery: Secondary | ICD-10-CM | POA: Diagnosis not present

## 2013-12-10 ENCOUNTER — Encounter (HOSPITAL_COMMUNITY)
Admission: RE | Admit: 2013-12-10 | Discharge: 2013-12-10 | Disposition: A | Discharge status: Home / Self Care | Payer: Medicare PPO | Source: Ambulatory Visit | Attending: Cardiology | Admitting: Cardiology

## 2013-12-10 DIAGNOSIS — Z5189 Encounter for other specified aftercare: Secondary | ICD-10-CM | POA: Diagnosis not present

## 2013-12-15 ENCOUNTER — Encounter (HOSPITAL_COMMUNITY)
Admission: RE | Admit: 2013-12-15 | Discharge: 2013-12-15 | Disposition: A | Discharge status: Home / Self Care | Payer: Medicare PPO | Source: Ambulatory Visit | Attending: Cardiology | Admitting: Cardiology

## 2013-12-15 DIAGNOSIS — Z5189 Encounter for other specified aftercare: Secondary | ICD-10-CM | POA: Diagnosis not present

## 2013-12-17 ENCOUNTER — Encounter (HOSPITAL_COMMUNITY): Payer: Medicare PPO

## 2013-12-17 DIAGNOSIS — Z5189 Encounter for other specified aftercare: Secondary | ICD-10-CM | POA: Diagnosis not present

## 2013-12-20 ENCOUNTER — Encounter (HOSPITAL_COMMUNITY)
Admission: RE | Admit: 2013-12-20 | Discharge: 2013-12-20 | Disposition: A | Discharge status: Home / Self Care | Payer: Medicare PPO | Source: Ambulatory Visit | Attending: Cardiology | Admitting: Cardiology

## 2013-12-20 DIAGNOSIS — Z5189 Encounter for other specified aftercare: Secondary | ICD-10-CM | POA: Diagnosis not present

## 2013-12-22 ENCOUNTER — Encounter (HOSPITAL_COMMUNITY): Payer: Medicare PPO

## 2013-12-24 ENCOUNTER — Encounter (HOSPITAL_COMMUNITY)
Admission: RE | Admit: 2013-12-24 | Discharge: 2013-12-24 | Disposition: A | Discharge status: Home / Self Care | Payer: Medicare PPO | Source: Ambulatory Visit | Attending: Cardiology | Admitting: Cardiology

## 2013-12-24 DIAGNOSIS — Z5189 Encounter for other specified aftercare: Secondary | ICD-10-CM | POA: Diagnosis not present

## 2013-12-24 NOTE — Progress Notes (Signed)
[name] 73 y.o. female Nutrition Note Spoke with pt.  Nutrition Survey reviewed with pt. Pt is working towards following Step 1 of the Therapeutic Lifestyle Changes diet. Pt wants to lose wt. Pt has been trying to lose wt by changing her diet and decreasing portion sizes consumed. Pt wt today 74.5 kg, which is stable since admission. Pt wt is down 14 lb from pt's reported UBW of 178 lb. Wt loss tips reviewed. Pt expressed understanding of the information reviewed. Pt aware of nutrition education classes offered.  Nutrition Diagnosis   Food-and nutrition-related knowledge deficit related to lack of exposure to information as related to diagnosis of: ? CVD    Obesity related to excessive energy intake as evidenced by a BMI of 30.1  Nutrition Intervention   Benefits of adopting Therapeutic Lifestyle Changes discussed when Medficts reviewed.   Pt to attend the Portion Distortion class   Pt to attend the  ? Nutrition I class - met; met 12/07/13                    ? Nutrition II class - met ; 12/14/13   Continue client-centered nutrition education by RD, as part of interdisciplinary care.  Goal(s)   Pt to identify and limit food sources of saturated fat, trans fat, and cholesterol   Pt to identify food quantities necessary to achieve: ? wt loss to a goal wt of 140-158 lb (63.8-72 kg) at graduation from cardiac rehab.   Monitor and Evaluate progress toward nutrition goal with team.   Derek Mound, M.Ed, RD, LDN, CDE 12/24/2013 12:20 PM

## 2013-12-27 ENCOUNTER — Encounter (HOSPITAL_COMMUNITY)
Admission: RE | Admit: 2013-12-27 | Discharge: 2013-12-27 | Disposition: A | Discharge status: Home / Self Care | Payer: Medicare PPO | Source: Ambulatory Visit | Attending: Cardiology | Admitting: Cardiology

## 2013-12-27 DIAGNOSIS — Z5189 Encounter for other specified aftercare: Secondary | ICD-10-CM | POA: Diagnosis not present

## 2013-12-29 ENCOUNTER — Encounter (HOSPITAL_COMMUNITY)
Admission: RE | Admit: 2013-12-29 | Discharge: 2013-12-29 | Disposition: A | Discharge status: Home / Self Care | Payer: Medicare PPO | Source: Ambulatory Visit | Attending: Cardiology | Admitting: Cardiology

## 2013-12-29 ENCOUNTER — Other Ambulatory Visit: Payer: Self-pay

## 2013-12-29 ENCOUNTER — Telehealth: Payer: Self-pay | Admitting: General Surgery

## 2013-12-29 DIAGNOSIS — Z5189 Encounter for other specified aftercare: Secondary | ICD-10-CM | POA: Diagnosis not present

## 2013-12-29 NOTE — Telephone Encounter (Signed)
Pt has a Dentist appt tomorrow 9/24 and she needs meds prior to procedure. She is allergic to Penicillin what do we need to prescribe for pt?

## 2013-12-29 NOTE — Telephone Encounter (Signed)
Pt made aware

## 2013-12-29 NOTE — Telephone Encounter (Signed)
Patient's dentist needs to prescribe the antibiotics

## 2013-12-30 ENCOUNTER — Telehealth: Payer: Self-pay | Admitting: Cardiology

## 2013-12-30 NOTE — Telephone Encounter (Signed)
°  Susie from pt's dentist office called. Patient is coming in at 1pm for a dental cleaning. They would like to know what to do about antibiotic's? Please call and advise. Per Susie they need this ASAP!

## 2013-12-30 NOTE — Telephone Encounter (Signed)
Tried to call back but number is the fax. Faxed [employer] recommendation card to them, indicating to use clindamycin 600 mg 30-60 minutes prior to procedure, with note to call back with further questions.

## 2013-12-31 ENCOUNTER — Telehealth: Payer: Self-pay | Admitting: Cardiology

## 2013-12-31 ENCOUNTER — Encounter (HOSPITAL_COMMUNITY)
Admission: RE | Admit: 2013-12-31 | Discharge: 2013-12-31 | Disposition: A | Discharge status: Home / Self Care | Payer: Medicare PPO | Source: Ambulatory Visit | Attending: Cardiology | Admitting: Cardiology

## 2013-12-31 DIAGNOSIS — Z5189 Encounter for other specified aftercare: Secondary | ICD-10-CM | POA: Diagnosis not present

## 2013-12-31 NOTE — Telephone Encounter (Signed)
Lm on machine for [name] in Dr. Quillian Quince Harris's office that they should contact her surgeon Dr. Patricia Pesa office to clarify when can have dental work.

## 2013-12-31 NOTE — Telephone Encounter (Signed)
°  Susie called from dental office. Wanted to know if there is a time frame that has to be waited for dental work after a heart valve replacement? Office is now closed per Susie ok to leave answer on machine. I did ask Jodi Mourning while Susie was on the phone and she said I'd have to send a message to Triage, Because she was unaware of the answer.

## 2014-01-03 ENCOUNTER — Encounter (HOSPITAL_COMMUNITY)
Admission: RE | Admit: 2014-01-03 | Discharge: 2014-01-03 | Disposition: A | Discharge status: Home / Self Care | Payer: Medicare PPO | Source: Ambulatory Visit | Attending: Cardiology | Admitting: Cardiology

## 2014-01-03 DIAGNOSIS — Z5189 Encounter for other specified aftercare: Secondary | ICD-10-CM | POA: Diagnosis not present

## 2014-01-05 ENCOUNTER — Encounter (HOSPITAL_COMMUNITY)
Admission: RE | Admit: 2014-01-05 | Discharge: 2014-01-05 | Disposition: A | Discharge status: Home / Self Care | Payer: Medicare PPO | Source: Ambulatory Visit | Attending: Cardiology | Admitting: Cardiology

## 2014-01-05 ENCOUNTER — Other Ambulatory Visit (INDEPENDENT_AMBULATORY_CARE_PROVIDER_SITE_OTHER): Payer: Medicare PPO

## 2014-01-05 DIAGNOSIS — Z5189 Encounter for other specified aftercare: Secondary | ICD-10-CM | POA: Diagnosis not present

## 2014-01-05 DIAGNOSIS — I10 Essential (primary) hypertension: Secondary | ICD-10-CM

## 2014-01-05 LAB — BASIC METABOLIC PANEL
BUN: 14 mg/dL (ref 6–23)
CALCIUM: 9.2 mg/dL (ref 8.4–10.5)
CO2: 27 mEq/L (ref 19–32)
Chloride: 105 mEq/L (ref 96–112)
Creatinine, Ser: 0.8 mg/dL (ref 0.4–1.2)
GFR: 74.2 mL/min (ref >60.00)
GLUCOSE: 86 mg/dL (ref 70–99)
POTASSIUM: 3.9 meq/L (ref 3.5–5.1)
SODIUM: 138 meq/L (ref 135–145)

## 2014-01-06 ENCOUNTER — Telehealth: Payer: Self-pay | Admitting: *Deleted

## 2014-01-06 NOTE — Telephone Encounter (Signed)
pt notified about lab results with verbal understanding  

## 2014-01-07 ENCOUNTER — Encounter (HOSPITAL_COMMUNITY)
Admission: RE | Admit: 2014-01-07 | Discharge: 2014-01-07 | Disposition: A | Discharge status: Home / Self Care | Payer: Medicare PPO | Source: Ambulatory Visit | Attending: Cardiology | Admitting: Cardiology

## 2014-01-07 DIAGNOSIS — Z954 Presence of other heart-valve replacement: Secondary | ICD-10-CM | POA: Insufficient documentation

## 2014-01-10 ENCOUNTER — Ambulatory Visit (INDEPENDENT_AMBULATORY_CARE_PROVIDER_SITE_OTHER): Payer: Medicare PPO | Admitting: Cardiology

## 2014-01-10 ENCOUNTER — Encounter (HOSPITAL_COMMUNITY)
Admission: RE | Admit: 2014-01-10 | Discharge: 2014-01-10 | Disposition: A | Discharge status: Home / Self Care | Payer: Medicare PPO | Source: Ambulatory Visit | Attending: Cardiology | Admitting: Cardiology

## 2014-01-10 ENCOUNTER — Encounter: Payer: Self-pay | Admitting: Cardiology

## 2014-01-10 VITALS — BP 134/80 | HR 76 | Ht 61.0 in | Wt 163.4 lb

## 2014-01-10 DIAGNOSIS — Z954 Presence of other heart-valve replacement: Secondary | ICD-10-CM | POA: Diagnosis not present

## 2014-01-10 DIAGNOSIS — I6529 Occlusion and stenosis of unspecified carotid artery: Secondary | ICD-10-CM

## 2014-01-10 DIAGNOSIS — Z952 Presence of prosthetic heart valve: Secondary | ICD-10-CM

## 2014-01-10 DIAGNOSIS — I10 Essential (primary) hypertension: Secondary | ICD-10-CM

## 2014-01-10 DIAGNOSIS — I350 Nonrheumatic aortic (valve) stenosis: Secondary | ICD-10-CM

## 2014-01-10 NOTE — Progress Notes (Signed)
[place], [place],   [address] Phone: [phone] Fax:  [phone]  Date:  01/10/2014   ID:  [name], DOB Aug 12, 1940, MRN [id number]  PCP:  Horton Finer, MD  Cardiologist:  Fransico Him, MD    History of Present Illness: [name] is a 73 y.o. female with a history of HTN and aortic stenosis. Recent echocardiogram demonstrated progression of aortic stenosis to severe. Cardiac catheterization was arranged and demonstrated normal coronary arteries. She was referred to [facility] for aortic valve replacement. She was admitted 7/9-7/13. She underwent AVR with a bioprosthetic valve by Dr. Cyndia Bent. Postoperative course was fairly uneventful. She did require transfusion with PRBCs due to blood loss anemia. She remained in sinus rhythm. Of note, presurgical Dopplers did demonstrate carotid stenosis. Post op echo showed stable AVR with no perivalvular AI and normal LVF.   She is doing well.She denies significant dyspnea or chest pain. She denies orthopnea, PND or edema. She denies syncope or palpitations.  Occasionally she will have some dizziness when going from sitting to standing.     Wt Readings from Last 3 Encounters:  01/10/14 163 lb 6.4 oz (74.118 kg)  11/25/13 164 lb 10.9 oz (74.7 kg)  11/24/13 163 lb (73.936 kg)     Past Medical History  Diagnosis Date  . Obesity   . Abnormal vaginal Pap smear 1994    annual paps for years after that.more recently every other year,last in 2012-we agreed not to do them anymore  . Hypercholesteremia   . Osteopenia     declines treatment  . Aortic stenosis     s/p AVR with bioprosthesis  . Pneumonia 1995  . Vitamin D insufficiency   . Shoulder pain     Due to arthritis  . Hypertension   . Heart murmur   . Anxiety     no rx  . Hx of echocardiogram     Echo (8/15): EF 60-65%, Normal Wall Motion, AVR okay (mean 10 mm Hg) with no perivalvular regurgitation  . Coronary artery disease 2008    nonobstructive ASCAD  with 30% LAD, normal coronary arteries 09/2013  . Carotid artery occlusion 10/2013    60-80% right and 1-40% left carotid artery stenosis    Current Outpatient Prescriptions  Medication Sig Dispense Refill  . aspirin 81 MG tablet Take 81 mg by mouth every other day.       . Cholecalciferol (VITAMIN D) 2000 UNITS tablet Take 2,000 Units by mouth every other day.      . lisinopril (PRINIVIL,ZESTRIL) 10 MG tablet Take 10 mg by mouth daily.      Marland Kitchen spironolactone (ALDACTONE) 25 MG tablet Take 0.5 tablets (12.5 mg total) by mouth daily.  30 tablet  11   No current facility-administered medications for this visit.    Allergies:    Allergies  Allergen Reactions  . Lipitor [Atorvastatin] Other (See Comments)    Stomach pain  . Pravastatin Other (See Comments)    Myalgias   . Simvastatin Other (See Comments)    Myalgias   . Tramadol Other (See Comments)    FEELS LOOPY  . Codeine Palpitations  . Penicillins Palpitations  . Sulfa Antibiotics Palpitations    Social History:  The patient  reports that she has never smoked. She does not have any smokeless tobacco history on file. She reports that she does not drink alcohol or use illicit drugs.   Family History:  The patient's family history includes Arrhythmia  in her sister; Dementia in her mother; Heart attack in her brother, father, and maternal grandfather; Heart disease in her brother and father.   ROS:  Please see the history of present illness.      All other systems reviewed and negative.   PHYSICAL EXAM: VS:  BP 134/80  Pulse 76  Ht 5\' 1"  (1.549 m)  Wt 163 lb 6.4 oz (74.118 kg)  BMI 30.89 kg/m2 Well nourished, well developed, in no acute distress HEENT: normal Neck: no JVD Cardiac:  normal S1, S2; RRR; no murmur Lungs:  clear to auscultation bilaterally, no wheezing, rhonchi or rales Abd: soft, nontender, no hepatomegaly Ext: no edema Skin: warm and dry Neuro:  CNs 2-12 intact, no focal abnormalities noted  ASSESSMENT  AND PLAN:  1.  Aortic stenosis S/P AVR: She is doing well after recent aortic valve replacement. She is participating in cardiac rehabilitation.  We discussed the importance of SBE prophylaxis.  - continue ASA 2.  Occlusion and stenosis of carotid artery without mention of cerebral infarction: Arrange repeat carotid US in 6 months.  - continue ASA 3.  Essential hypertension, benign: controlled - continue aldactone and ACE I with stable BMET  Followup with me in 6 months   Signed, Fransico Him, MD [facility] 01/10/2014 3:20 PM

## 2014-01-10 NOTE — Patient Instructions (Signed)
Your physician recommends that you continue on your current medications as directed. Please refer to the Current Medication list given to you today.  Your physician wants you to follow-up in: 6 months with Dr Turner You will receive a reminder letter in the mail two months in advance. If you don't receive a letter, please call our office to schedule the follow-up appointment.  

## 2014-01-11 ENCOUNTER — Encounter: Payer: Self-pay | Admitting: Cardiology

## 2014-01-12 ENCOUNTER — Encounter (HOSPITAL_COMMUNITY)
Admission: RE | Admit: 2014-01-12 | Discharge: 2014-01-12 | Disposition: A | Discharge status: Home / Self Care | Payer: Medicare PPO | Source: Ambulatory Visit | Attending: Cardiology | Admitting: Cardiology

## 2014-01-12 DIAGNOSIS — Z954 Presence of other heart-valve replacement: Secondary | ICD-10-CM | POA: Diagnosis not present

## 2014-01-14 ENCOUNTER — Encounter (HOSPITAL_COMMUNITY)
Admission: RE | Admit: 2014-01-14 | Discharge: 2014-01-14 | Disposition: A | Discharge status: Home / Self Care | Payer: Medicare PPO | Source: Ambulatory Visit | Attending: Cardiology | Admitting: Cardiology

## 2014-01-14 ENCOUNTER — Ambulatory Visit: Payer: Medicare PPO | Admitting: Cardiology

## 2014-01-14 DIAGNOSIS — Z954 Presence of other heart-valve replacement: Secondary | ICD-10-CM | POA: Diagnosis not present

## 2014-01-17 ENCOUNTER — Encounter (HOSPITAL_COMMUNITY)
Admission: RE | Admit: 2014-01-17 | Discharge: 2014-01-17 | Disposition: A | Discharge status: Home / Self Care | Payer: Medicare PPO | Source: Ambulatory Visit | Attending: Cardiology | Admitting: Cardiology

## 2014-01-17 DIAGNOSIS — Z954 Presence of other heart-valve replacement: Secondary | ICD-10-CM | POA: Diagnosis not present

## 2014-01-19 ENCOUNTER — Encounter (HOSPITAL_COMMUNITY)
Admission: RE | Admit: 2014-01-19 | Discharge: 2014-01-19 | Disposition: A | Discharge status: Home / Self Care | Payer: Medicare PPO | Source: Ambulatory Visit | Attending: Cardiology | Admitting: Cardiology

## 2014-01-19 DIAGNOSIS — Z954 Presence of other heart-valve replacement: Secondary | ICD-10-CM | POA: Diagnosis not present

## 2014-01-21 ENCOUNTER — Encounter (HOSPITAL_COMMUNITY)
Admission: RE | Admit: 2014-01-21 | Discharge: 2014-01-21 | Disposition: A | Discharge status: Home / Self Care | Payer: Medicare PPO | Source: Ambulatory Visit | Attending: Cardiology | Admitting: Cardiology

## 2014-01-21 DIAGNOSIS — Z954 Presence of other heart-valve replacement: Secondary | ICD-10-CM | POA: Diagnosis not present

## 2014-01-24 ENCOUNTER — Encounter (HOSPITAL_COMMUNITY)
Admission: RE | Admit: 2014-01-24 | Discharge: 2014-01-24 | Disposition: A | Discharge status: Home / Self Care | Payer: Medicare PPO | Source: Ambulatory Visit | Attending: Cardiology | Admitting: Cardiology

## 2014-01-24 DIAGNOSIS — Z954 Presence of other heart-valve replacement: Secondary | ICD-10-CM | POA: Diagnosis not present

## 2014-01-26 ENCOUNTER — Encounter (HOSPITAL_COMMUNITY): Payer: Medicare PPO

## 2014-01-28 ENCOUNTER — Encounter (HOSPITAL_COMMUNITY)
Admission: RE | Admit: 2014-01-28 | Discharge: 2014-01-28 | Disposition: A | Discharge status: Home / Self Care | Payer: Medicare PPO | Source: Ambulatory Visit | Attending: Cardiology | Admitting: Cardiology

## 2014-01-28 DIAGNOSIS — Z954 Presence of other heart-valve replacement: Secondary | ICD-10-CM | POA: Diagnosis not present

## 2014-01-31 ENCOUNTER — Encounter (HOSPITAL_COMMUNITY)
Admission: RE | Admit: 2014-01-31 | Discharge: 2014-01-31 | Disposition: A | Discharge status: Home / Self Care | Payer: Medicare PPO | Source: Ambulatory Visit | Attending: Cardiology | Admitting: Cardiology

## 2014-01-31 DIAGNOSIS — Z954 Presence of other heart-valve replacement: Secondary | ICD-10-CM | POA: Diagnosis not present

## 2014-02-02 ENCOUNTER — Encounter (HOSPITAL_COMMUNITY)
Admission: RE | Admit: 2014-02-02 | Discharge: 2014-02-02 | Disposition: A | Discharge status: Home / Self Care | Payer: Medicare PPO | Source: Ambulatory Visit | Attending: Cardiology | Admitting: Cardiology

## 2014-02-02 DIAGNOSIS — Z954 Presence of other heart-valve replacement: Secondary | ICD-10-CM | POA: Diagnosis not present

## 2014-02-04 ENCOUNTER — Encounter (HOSPITAL_COMMUNITY)
Admission: RE | Admit: 2014-02-04 | Discharge: 2014-02-04 | Disposition: A | Discharge status: Home / Self Care | Payer: Medicare PPO | Source: Ambulatory Visit | Attending: Cardiology | Admitting: Cardiology

## 2014-02-04 DIAGNOSIS — Z954 Presence of other heart-valve replacement: Secondary | ICD-10-CM | POA: Diagnosis not present

## 2014-02-07 ENCOUNTER — Encounter (HOSPITAL_COMMUNITY)
Admission: RE | Admit: 2014-02-07 | Discharge: 2014-02-07 | Disposition: A | Discharge status: Home / Self Care | Payer: Medicare PPO | Source: Ambulatory Visit | Attending: Cardiology | Admitting: Cardiology

## 2014-02-07 DIAGNOSIS — Z954 Presence of other heart-valve replacement: Secondary | ICD-10-CM | POA: Insufficient documentation

## 2014-02-09 ENCOUNTER — Encounter (HOSPITAL_COMMUNITY): Payer: Medicare PPO

## 2014-02-09 DIAGNOSIS — Z954 Presence of other heart-valve replacement: Secondary | ICD-10-CM | POA: Diagnosis not present

## 2014-02-11 ENCOUNTER — Encounter (HOSPITAL_COMMUNITY)
Admission: RE | Admit: 2014-02-11 | Discharge: 2014-02-11 | Disposition: A | Discharge status: Home / Self Care | Payer: Medicare PPO | Source: Ambulatory Visit | Attending: Cardiology | Admitting: Cardiology

## 2014-02-11 DIAGNOSIS — Z954 Presence of other heart-valve replacement: Secondary | ICD-10-CM | POA: Diagnosis not present

## 2014-02-14 ENCOUNTER — Encounter (HOSPITAL_COMMUNITY)
Admission: RE | Admit: 2014-02-14 | Discharge: 2014-02-14 | Disposition: A | Discharge status: Home / Self Care | Payer: Medicare PPO | Source: Ambulatory Visit | Attending: Cardiology | Admitting: Cardiology

## 2014-02-14 DIAGNOSIS — Z954 Presence of other heart-valve replacement: Secondary | ICD-10-CM | POA: Diagnosis not present

## 2014-02-16 ENCOUNTER — Encounter (HOSPITAL_COMMUNITY)
Admission: RE | Admit: 2014-02-16 | Discharge: 2014-02-16 | Disposition: A | Discharge status: Home / Self Care | Payer: Medicare PPO | Source: Ambulatory Visit | Attending: Cardiology | Admitting: Cardiology

## 2014-02-16 DIAGNOSIS — Z954 Presence of other heart-valve replacement: Secondary | ICD-10-CM | POA: Diagnosis not present

## 2014-02-18 ENCOUNTER — Encounter (HOSPITAL_COMMUNITY)
Admission: RE | Admit: 2014-02-18 | Discharge: 2014-02-18 | Disposition: A | Discharge status: Home / Self Care | Payer: Medicare PPO | Source: Ambulatory Visit | Attending: Cardiology | Admitting: Cardiology

## 2014-02-18 DIAGNOSIS — Z954 Presence of other heart-valve replacement: Secondary | ICD-10-CM | POA: Diagnosis not present

## 2014-02-21 ENCOUNTER — Encounter (HOSPITAL_COMMUNITY)
Admission: RE | Admit: 2014-02-21 | Discharge: 2014-02-21 | Disposition: A | Discharge status: Home / Self Care | Payer: Medicare PPO | Source: Ambulatory Visit | Attending: Cardiology | Admitting: Cardiology

## 2014-02-21 DIAGNOSIS — Z954 Presence of other heart-valve replacement: Secondary | ICD-10-CM | POA: Diagnosis not present

## 2014-02-23 ENCOUNTER — Encounter (HOSPITAL_COMMUNITY): Payer: Medicare PPO

## 2014-02-25 ENCOUNTER — Encounter (HOSPITAL_COMMUNITY)
Admission: RE | Admit: 2014-02-25 | Discharge: 2014-02-25 | Disposition: A | Discharge status: Home / Self Care | Payer: Medicare PPO | Source: Ambulatory Visit | Attending: Cardiology | Admitting: Cardiology

## 2014-02-25 DIAGNOSIS — Z954 Presence of other heart-valve replacement: Secondary | ICD-10-CM | POA: Diagnosis not present

## 2014-02-28 ENCOUNTER — Encounter (HOSPITAL_COMMUNITY)
Admission: RE | Admit: 2014-02-28 | Discharge: 2014-02-28 | Disposition: A | Discharge status: Home / Self Care | Payer: Medicare PPO | Source: Ambulatory Visit | Attending: Cardiology | Admitting: Cardiology

## 2014-02-28 DIAGNOSIS — Z954 Presence of other heart-valve replacement: Secondary | ICD-10-CM | POA: Diagnosis not present

## 2014-02-28 NOTE — Progress Notes (Signed)
Pt graduates today from cardiac rehab phase II with the completion of 36 exercise sessions.   Pt maintained excellent attendance to exercise and education classes.  Pt made good progress physically with the progression of met averages to 2.7 to 3.3.  Pt admits that she has struggled with exercise on the days she did not attend cardiac rehab.  Pt voiced time constraints as being an issue.  Pt is involved in many activities with the temple.  Pt states she exercised every day prior to her heart surgery but has not been able to consistently exercise while attending rehab.  Her hope is that once she no longer has to come to rehab this will "free" her time up.  Offered cardiac maintenance here at  [facility]. Pt declined due to the cost and distance to travel. Pt met goal to learn about exercise and plans to execute better.  Pt maintained weight loss from surgery but has not lost anymore.  Pt has the tools for heart healthy eating but again struggles with the execution,  Medication list reconciled.  Pt verbalizes compliance.  Repeat PHQ2 score 0. It was a pleasure working with this pt in cardiac rehab. Cherre Huger, BSN

## 2014-03-02 ENCOUNTER — Encounter (HOSPITAL_COMMUNITY): Payer: Medicare PPO

## 2014-03-17 ENCOUNTER — Encounter (HOSPITAL_COMMUNITY): Payer: Self-pay | Admitting: Cardiology

## 2014-04-27 ENCOUNTER — Encounter: Payer: Self-pay | Admitting: Cardiology

## 2014-05-12 ENCOUNTER — Other Ambulatory Visit: Payer: Self-pay

## 2014-05-12 DIAGNOSIS — Z1231 Encounter for screening mammogram for malignant neoplasm of breast: Secondary | ICD-10-CM

## 2014-05-16 ENCOUNTER — Telehealth: Payer: Self-pay | Admitting: *Deleted

## 2014-05-16 ENCOUNTER — Encounter: Payer: Self-pay | Admitting: Physician Assistant

## 2014-05-16 ENCOUNTER — Other Ambulatory Visit: Payer: Self-pay | Admitting: *Deleted

## 2014-05-16 ENCOUNTER — Ambulatory Visit (HOSPITAL_COMMUNITY): Payer: Medicare PPO | Attending: Cardiovascular Disease | Admitting: Cardiology

## 2014-05-16 DIAGNOSIS — I10 Essential (primary) hypertension: Secondary | ICD-10-CM | POA: Insufficient documentation

## 2014-05-16 DIAGNOSIS — I6523 Occlusion and stenosis of bilateral carotid arteries: Secondary | ICD-10-CM | POA: Diagnosis not present

## 2014-05-16 DIAGNOSIS — R42 Dizziness and giddiness: Secondary | ICD-10-CM

## 2014-05-16 DIAGNOSIS — I6529 Occlusion and stenosis of unspecified carotid artery: Secondary | ICD-10-CM

## 2014-05-16 NOTE — Telephone Encounter (Signed)
pt notified about carotid u/s results and was advised per Brynda Rim. 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 I will fax results to PCP for pt. Pt verbalized understanding to plan of care.

## 2014-05-16 NOTE — Progress Notes (Signed)
Carotid duplex performed 

## 2014-05-24 ENCOUNTER — Other Ambulatory Visit: Payer: Self-pay | Admitting: Internal Medicine

## 2014-05-24 DIAGNOSIS — E041 Nontoxic single thyroid nodule: Secondary | ICD-10-CM

## 2014-05-26 ENCOUNTER — Ambulatory Visit
Admission: RE | Admit: 2014-05-26 | Discharge: 2014-05-26 | Disposition: A | Discharge status: Home / Self Care | Payer: Medicare PPO | Source: Ambulatory Visit | Attending: Internal Medicine | Admitting: Internal Medicine

## 2014-05-26 DIAGNOSIS — E041 Nontoxic single thyroid nodule: Secondary | ICD-10-CM

## 2014-06-01 ENCOUNTER — Other Ambulatory Visit: Payer: Self-pay | Admitting: Internal Medicine

## 2014-06-01 ENCOUNTER — Ambulatory Visit
Admission: RE | Admit: 2014-06-01 | Discharge: 2014-06-01 | Disposition: A | Discharge status: Home / Self Care | Payer: Medicare PPO | Source: Ambulatory Visit | Attending: Gastroenterology | Admitting: Gastroenterology

## 2014-06-01 ENCOUNTER — Other Ambulatory Visit: Payer: Self-pay | Admitting: Gastroenterology

## 2014-06-01 ENCOUNTER — Other Ambulatory Visit (HOSPITAL_COMMUNITY)
Admission: RE | Admit: 2014-06-01 | Discharge: 2014-06-01 | Disposition: A | Discharge status: Home / Self Care | Payer: Medicare PPO | Source: Ambulatory Visit | Attending: Interventional Radiology | Admitting: Interventional Radiology

## 2014-06-01 DIAGNOSIS — E041 Nontoxic single thyroid nodule: Secondary | ICD-10-CM

## 2014-06-10 ENCOUNTER — Ambulatory Visit
Admission: RE | Admit: 2014-06-10 | Discharge: 2014-06-10 | Disposition: A | Discharge status: Home / Self Care | Payer: Medicare PPO | Source: Ambulatory Visit

## 2014-06-10 DIAGNOSIS — Z1231 Encounter for screening mammogram for malignant neoplasm of breast: Secondary | ICD-10-CM

## 2014-07-13 NOTE — Progress Notes (Signed)
Cardiology Office Note   Date:  07/14/2014   ID:  [name], DOB April 03, 1941, MRN [id number]  PCP:  Dorian Heckle, MD    Chief Complaint  Patient presents with  . Aortic Stenosis  . Hypertension      History of Present Illness: [name] is a 74 y.o. female with a history of HTN and aortic stenosis s/p  AVR with a bioprosthetic valve by Dr. Cyndia Bent.  She is doing well.  She denies significant dyspnea or chest pain. She denies orthopnea, PND, palpitations, dizziness or edema. She walks for exercise and has not problems with it.    Past Medical History  Diagnosis Date  . Obesity   . Abnormal vaginal Pap smear 1994    annual paps for years after that.more recently every other year,last in 2012-we agreed not to do them anymore  . Hypercholesteremia   . Osteopenia     declines treatment  . Aortic stenosis     s/p AVR with bioprosthesis  . Pneumonia 1995  . Vitamin D insufficiency   . Shoulder pain     Due to arthritis  . Hypertension   . Heart murmur   . Anxiety     no rx  . Hx of echocardiogram     Echo (8/15): EF 60-65%, Normal Wall Motion, AVR okay (mean 10 mm Hg) with no perivalvular regurgitation  . Coronary artery disease 2008    nonobstructive ASCAD with 30% LAD, normal coronary arteries 09/2013  . Carotid artery occlusion     Carotid US (05/2014):  R 60-79%; L 1-39% >> FU 6 mos; 2 (0.5 cm x 1.1 cm and 0.7 cm x 0.8 cm) thyroid masses >> needs FU with PCP    Past Surgical History  Procedure Laterality Date  . Cardiac catheterization    . Cholecystectomy  88  . Aortic valve replacement N/A 10/14/2013    Procedure: AORTIC VALVE REPLACEMENT (AVR);  Surgeon: Gaye Pollack, MD;  Location: [place];  Service: Open Heart Surgery;  Laterality: N/A;  . Intraoperative transesophageal echocardiogram N/A 10/14/2013    Procedure: INTRAOPERATIVE TRANSESOPHAGEAL ECHOCARDIOGRAM;  Surgeon: Gaye Pollack, MD;  Location: [facility] OR;  Service: Open Heart Surgery;  Laterality: N/A;  .  Left and right heart catheterization with coronary angiogram N/A 09/16/2013    Procedure: LEFT AND RIGHT HEART CATHETERIZATION WITH CORONARY ANGIOGRAM;  Surgeon: Sueanne Margarita, MD;  Location: [facility] CATH LAB;  Service: Cardiovascular;  Laterality: N/A;     Current Outpatient Prescriptions  Medication Sig Dispense Refill  . aspirin 81 MG tablet Take 81 mg by mouth every other day.     . Cholecalciferol (VITAMIN D) 2000 UNITS tablet Take 2,000 Units by mouth daily.     Marland Kitchen lisinopril (PRINIVIL,ZESTRIL) 10 MG tablet Take 10 mg by mouth daily.    Marland Kitchen spironolactone (ALDACTONE) 25 MG tablet Take 0.5 tablets (12.5 mg total) by mouth daily. 30 tablet 11   No current facility-administered medications for this visit.    Allergies:   Lipitor; Pravastatin; Simvastatin; Tramadol; Codeine; Penicillins; and Sulfa antibiotics    Social History:  The patient  reports that she has never smoked. She does not have any smokeless tobacco history on file. She reports that she does not drink alcohol or use illicit drugs.   Family History:  The patient's family history includes Arrhythmia in her sister; Dementia in her mother; Heart attack in her brother, father, and maternal grandfather; Heart disease in her brother and  father.    ROS:  Please see the history of present illness.   Otherwise, review of systems are positive for none.   All other systems are reviewed and negative.    PHYSICAL EXAM: VS:  BP 140/54 mmHg  Pulse 51  Ht 5' 1.5" (1.562 m)  Wt 171 lb 12.8 oz (77.928 kg)  BMI 31.94 kg/m2  SpO2 97% , BMI Body mass index is 31.94 kg/(m^2). GEN: Well nourished, well developed, in no acute distress HEENT: normal Neck: no JVD, carotid bruits, or masses Cardiac: RRR; no murmurs, rubs, or gallops,no edema  Respiratory:  clear to auscultation bilaterally, normal work of breathing GI: soft, nontender, nondistended, + BS MS: no deformity or atrophy Skin: warm and dry, no rash Neuro:  Strength and sensation are  intact Psych: euthymic mood, full affect   EKG:  EKG is not ordered today.    Recent Labs: 09/23/2013: Pro B Natriuretic peptide (BNP) 177.0 10/12/2013: ALT 28 10/15/2013: Magnesium 2.1 10/17/2013: Platelets 136* 10/21/2013: Hemoglobin 10.9* 01/05/2014: BUN 14; Creatinine 0.8; Potassium 3.9; Sodium 138    Lipid Panel No results found for: CHOL, TRIG, HDL, CHOLHDL, VLDL, LDLCALC, LDLDIRECT    Wt Readings from Last 3 Encounters:  07/14/14 171 lb 12.8 oz (77.928 kg)  01/10/14 163 lb 6.4 oz (74.118 kg)  11/25/13 164 lb 10.9 oz (74.7 kg)     ASSESSMENT AND PLAN:  1. Aortic stenosis S/P AVR: She is doing well after recent aortic valve replacement. We discussed the importance of SBE prophylaxis.  - continue ASA 2. Occlusion and stenosis of carotid artery without mention of cerebral infarction: Arrange repeat carotid US in 6 months.  - continue ASA 3. Essential hypertension, benign: controlled - continue aldactone and ACE I  - check BMET    Current medicines are reviewed at length with the patient today.  The patient does not have concerns regarding medicines.  The following changes have been made:  no change  Labs/ tests ordered today include: see above assessment and plan  Orders Placed This Encounter  Procedures  . Basic metabolic panel  . Carotid duplex     Disposition:   FU with me in 6 months   Signed, Sueanne Margarita, MD  07/14/2014 9:00 AM    [facility] [place], [place], [place]  [address] Phone: [phone]; Fax: [phone]

## 2014-07-14 ENCOUNTER — Ambulatory Visit (INDEPENDENT_AMBULATORY_CARE_PROVIDER_SITE_OTHER): Payer: Medicare PPO | Admitting: Cardiology

## 2014-07-14 ENCOUNTER — Encounter: Payer: Self-pay | Admitting: Cardiology

## 2014-07-14 ENCOUNTER — Encounter: Payer: Self-pay | Admitting: *Deleted

## 2014-07-14 VITALS — BP 140/54 | HR 51 | Ht 61.5 in | Wt 171.8 lb

## 2014-07-14 DIAGNOSIS — I350 Nonrheumatic aortic (valve) stenosis: Secondary | ICD-10-CM | POA: Diagnosis not present

## 2014-07-14 DIAGNOSIS — I6523 Occlusion and stenosis of bilateral carotid arteries: Secondary | ICD-10-CM

## 2014-07-14 DIAGNOSIS — I10 Essential (primary) hypertension: Secondary | ICD-10-CM | POA: Diagnosis not present

## 2014-07-14 LAB — BASIC METABOLIC PANEL
BUN: 25 mg/dL — AB (ref 6–23)
CO2: 30 meq/L (ref 19–32)
CREATININE: 0.99 mg/dL (ref 0.40–1.20)
Calcium: 9.2 mg/dL (ref 8.4–10.5)
Chloride: 106 mEq/L (ref 96–112)
GFR: 57.95 mL/min — ABNORMAL LOW (ref >60.00)
Glucose, Bld: 90 mg/dL (ref 70–99)
Potassium: 4 mEq/L (ref 3.5–5.1)
Sodium: 140 mEq/L (ref 135–145)

## 2014-07-14 NOTE — Patient Instructions (Signed)
Your physician recommends that you have lab work today--BMET.  Your physician has requested that you have a carotid duplex. This test is an ultrasound of the carotid arteries in your neck. It looks at blood flow through these arteries that supply the brain with blood. Allow one hour for this exam. There are no restrictions or special instructions. August 2016  Your physician wants you to follow-up in: 6 months with Dr Radford Pax (October 2016). You will receive a reminder letter in the mail two months in advance. If you don't receive a letter, please call our office to schedule the follow-up appointment.

## 2014-07-15 ENCOUNTER — Telehealth: Payer: Self-pay | Admitting: *Deleted

## 2014-07-15 NOTE — Telephone Encounter (Signed)
pt notified of lab results witgh verbal understanding to results given today. pt said thank you

## 2014-10-03 ENCOUNTER — Other Ambulatory Visit: Payer: Self-pay

## 2014-11-09 ENCOUNTER — Encounter: Payer: Self-pay | Admitting: Cardiology

## 2014-11-21 ENCOUNTER — Other Ambulatory Visit: Payer: Self-pay | Admitting: Physician Assistant

## 2014-11-21 DIAGNOSIS — I6523 Occlusion and stenosis of bilateral carotid arteries: Secondary | ICD-10-CM

## 2014-11-23 ENCOUNTER — Other Ambulatory Visit (HOSPITAL_COMMUNITY): Payer: Self-pay | Admitting: *Deleted

## 2014-11-23 ENCOUNTER — Ambulatory Visit (HOSPITAL_COMMUNITY)
Admission: RE | Admit: 2014-11-23 | Discharge: 2014-11-23 | Disposition: A | Discharge status: Home / Self Care | Payer: Medicare PPO | Source: Ambulatory Visit | Attending: Cardiology | Admitting: Cardiology

## 2014-11-23 ENCOUNTER — Encounter: Payer: Self-pay | Admitting: Physician Assistant

## 2014-11-23 DIAGNOSIS — I6523 Occlusion and stenosis of bilateral carotid arteries: Secondary | ICD-10-CM | POA: Diagnosis not present

## 2014-11-24 ENCOUNTER — Telehealth: Payer: Self-pay | Admitting: *Deleted

## 2014-11-24 NOTE — Telephone Encounter (Signed)
Pt notified of carotid u/s results by phone and that we will repeat in 2 yrs. Pt agreeable to plan of care.

## 2014-12-08 ENCOUNTER — Encounter: Payer: Self-pay | Admitting: Cardiology

## 2014-12-15 ENCOUNTER — Other Ambulatory Visit: Payer: Self-pay | Admitting: Physician Assistant

## 2015-01-11 ENCOUNTER — Ambulatory Visit: Payer: Medicare PPO | Admitting: Cardiology

## 2015-01-18 DIAGNOSIS — Z1389 Encounter for screening for other disorder: Secondary | ICD-10-CM | POA: Diagnosis not present

## 2015-01-18 DIAGNOSIS — Z23 Encounter for immunization: Secondary | ICD-10-CM | POA: Diagnosis not present

## 2015-01-18 DIAGNOSIS — I350 Nonrheumatic aortic (valve) stenosis: Secondary | ICD-10-CM | POA: Diagnosis not present

## 2015-01-18 DIAGNOSIS — I2510 Atherosclerotic heart disease of native coronary artery without angina pectoris: Secondary | ICD-10-CM | POA: Diagnosis not present

## 2015-01-18 DIAGNOSIS — E7800 Pure hypercholesterolemia, unspecified: Secondary | ICD-10-CM | POA: Diagnosis not present

## 2015-01-18 DIAGNOSIS — I6523 Occlusion and stenosis of bilateral carotid arteries: Secondary | ICD-10-CM | POA: Diagnosis not present

## 2015-01-18 DIAGNOSIS — E042 Nontoxic multinodular goiter: Secondary | ICD-10-CM | POA: Diagnosis not present

## 2015-01-18 DIAGNOSIS — I10 Essential (primary) hypertension: Secondary | ICD-10-CM | POA: Diagnosis not present

## 2015-01-18 DIAGNOSIS — K219 Gastro-esophageal reflux disease without esophagitis: Secondary | ICD-10-CM | POA: Diagnosis not present

## 2015-01-24 NOTE — Progress Notes (Signed)
Cardiology Office Note   Date:  01/25/2015   ID:  [name], DOB 12/28/1940, MRN [id number]  PCP:  Henrine Screws, MD    Chief Complaint  Patient presents with  . Aortic Stenosis  . Hypertension      History of Present Illness: [name] is a 74 y.o. female with a history of HTN and aortic stenosis s/p AVR with a bioprosthetic valve by Dr. Cyndia Bent. She is doing well. She denies significant dyspnea or chest pain. She denies orthopnea, PND, palpitations, dizziness or edema. She walks for exercise and has no problems with it.    Past Medical History  Diagnosis Date  . Obesity   . Abnormal vaginal Pap smear 1994    annual paps for years after that.more recently every other year,last in 2012-we agreed not to do them anymore  . Hypercholesteremia   . Osteopenia     declines treatment  . Aortic stenosis     s/p AVR with bioprosthesis  . Pneumonia 1995  . Vitamin D insufficiency   . Shoulder pain     Due to arthritis  . Hypertension   . Heart murmur   . Anxiety     no rx  . Hx of echocardiogram     Echo (8/15): EF 60-65%, Normal [place], AVR okay (mean 10 mm Hg) with no perivalvular regurgitation  . Coronary artery disease 2008    nonobstructive ASCAD with 30% LAD, normal coronary arteries 09/2013  . Carotid artery occlusion     a. Carotid US (05/2014):  R 60-79%; L 1-39% >> FU 6 mos; 2 (0.5 cm x 1.1 cm and 0.7 cm x 0.8 cm) thyroid masses >> needs FU with PCP;  b. Carotid US 8/16:  1-39% bilateral ICA stenosis.  Normal subclavian arteries, bilaterally.  -  f/u 2 years    Past Surgical History  Procedure Laterality Date  . Cardiac catheterization    . Cholecystectomy  90  . Aortic valve replacement N/A 10/14/2013    Procedure: AORTIC VALVE REPLACEMENT (AVR);  Surgeon: Gaye Pollack, MD;  Location: [facility];  Service: Open Heart Surgery;  Laterality: N/A;  . Intraoperative transesophageal echocardiogram N/A 10/14/2013    Procedure:  INTRAOPERATIVE TRANSESOPHAGEAL ECHOCARDIOGRAM;  Surgeon: Gaye Pollack, MD;  Location: [facility] OR;  Service: Open Heart Surgery;  Laterality: N/A;  . Left and right heart catheterization with coronary angiogram N/A 09/16/2013    Procedure: LEFT AND RIGHT HEART CATHETERIZATION WITH CORONARY ANGIOGRAM;  Surgeon: Sueanne Margarita, MD;  Location: [facility] CATH LAB;  Service: Cardiovascular;  Laterality: N/A;     Current Outpatient Prescriptions  Medication Sig Dispense Refill  . aspirin 81 MG tablet Take 81 mg by mouth every other day.     . Cholecalciferol (VITAMIN D) 2000 UNITS tablet Take 2,000 Units by mouth daily.     Marland Kitchen lisinopril (PRINIVIL,ZESTRIL) 10 MG tablet Take 10 mg by mouth daily.    Marland Kitchen spironolactone (ALDACTONE) 25 MG tablet TAKE ONE-HALF TABLET BY MOUTH ONCE DAILY. 30 tablet 0   No current facility-administered medications for this visit.    Allergies:   Lipitor; Pravastatin; Simvastatin; Tramadol; Codeine; Penicillins; and Sulfa antibiotics    Social History:  The patient  reports that she has never smoked. She does not have any smokeless tobacco history on file. She reports that she does not drink alcohol or use illicit drugs.  Family History:  The patient's family history includes Arrhythmia in her sister; Dementia in her mother; Heart attack in her brother, father, and maternal grandfather; Heart disease in her brother and father.    ROS:  Please see the history of present illness.   Otherwise, review of systems are positive for none.   All other systems are reviewed and negative.    PHYSICAL EXAM: VS:  BP 122/60 mmHg  Pulse 53  Ht 5' 1.5" (1.562 m)  Wt 178 lb (80.74 kg)  BMI 33.09 kg/m2 , BMI Body mass index is 33.09 kg/(m^2). GEN: Well nourished, well developed, in no acute distress HEENT: normal Neck: no JVD or masses.  Trace bilateral carotid artery bruits Cardiac: RRR; no rubs, or gallops,no edema.  1/6 SM at RUSB Respiratory:  clear to auscultation bilaterally, normal  work of breathing GI: soft, nontender, nondistended, + BS MS: no deformity or atrophy Skin: warm and dry, no rash Neuro:  Strength and sensation are intact Psych: euthymic mood, full affect   EKG:  EKG was ordered today and showed sinus bradycardia with IRBBB    Recent Labs: 07/14/2014: BUN 25*; Creatinine, Ser 0.99; Potassium 4.0; Sodium 140    Lipid Panel No results found for: CHOL, TRIG, HDL, CHOLHDL, VLDL, LDLCALC, LDLDIRECT    Wt Readings from Last 3 Encounters:  01/25/15 178 lb (80.74 kg)  07/14/14 171 lb 12.8 oz (77.928 kg)  01/10/14 163 lb 6.4 oz (74.118 kg)     ASSESSMENT AND PLAN:  1. Aortic stenosis S/P AVR: She is doing well after aortic valve replacement. We discussed the importance of SBE prophylaxis.  - continue ASA 2. Occlusion and stenosis of carotid artery without mention of cerebral infarction: Arrange repeat carotid US in 2 years  - continue ASA 3. Essential hypertension, benign: controlled - continue aldactone and ACE I  - check BMET 4.  Bradycardia - she is not on any AV nodal blocking agents.  She complains of some exertional fatigue.  I will get a 24 hour Holter to assess average HR.  I will check a TSH   Current medicines are reviewed at length with the patient today.  The patient does not have concerns regarding medicines.  The following changes have been made:  no change  Labs/ tests ordered today: See above Assessment and Plan No orders of the defined types were placed in this encounter.     Disposition:   FU with me in 1 year  Signed, Sueanne Margarita, MD  01/25/2015 9:18 AM    [facility] [address], [place], [place]  [address] Phone: [phone]; Fax: [phone]

## 2015-01-25 ENCOUNTER — Ambulatory Visit (INDEPENDENT_AMBULATORY_CARE_PROVIDER_SITE_OTHER): Payer: Medicare PPO | Admitting: Cardiology

## 2015-01-25 ENCOUNTER — Encounter: Payer: Self-pay | Admitting: Cardiology

## 2015-01-25 VITALS — BP 122/60 | HR 53 | Ht 61.5 in | Wt 178.0 lb

## 2015-01-25 DIAGNOSIS — I498 Other specified cardiac arrhythmias: Secondary | ICD-10-CM | POA: Diagnosis not present

## 2015-01-25 DIAGNOSIS — Q231 Congenital insufficiency of aortic valve: Secondary | ICD-10-CM

## 2015-01-25 DIAGNOSIS — I10 Essential (primary) hypertension: Secondary | ICD-10-CM | POA: Diagnosis not present

## 2015-01-25 DIAGNOSIS — Q230 Congenital stenosis of aortic valve: Secondary | ICD-10-CM

## 2015-01-25 DIAGNOSIS — R001 Bradycardia, unspecified: Secondary | ICD-10-CM

## 2015-01-25 DIAGNOSIS — I350 Nonrheumatic aortic (valve) stenosis: Secondary | ICD-10-CM | POA: Diagnosis not present

## 2015-01-25 DIAGNOSIS — I6523 Occlusion and stenosis of bilateral carotid arteries: Secondary | ICD-10-CM

## 2015-01-25 HISTORY — DX: Bradycardia, unspecified: R00.1

## 2015-01-25 LAB — BASIC METABOLIC PANEL
BUN: 23 mg/dL (ref 7–25)
CHLORIDE: 106 mmol/L (ref 98–110)
CO2: 27 mmol/L (ref 20–31)
CREATININE: 0.96 mg/dL — AB (ref 0.60–0.93)
Calcium: 9.2 mg/dL (ref 8.6–10.4)
GLUCOSE: 70 mg/dL (ref 65–99)
Potassium: 4.1 mmol/L (ref 3.5–5.3)
Sodium: 141 mmol/L (ref 135–146)

## 2015-01-25 LAB — TSH: TSH: 2.051 u[IU]/mL (ref 0.350–4.500)

## 2015-01-25 NOTE — Patient Instructions (Signed)
Medication Instructions:  Your physician recommends that you continue on your current medications as directed. Please refer to the Current Medication list given to you today.   Labwork: TODAY: BMET, TSH  Testing/Procedures: Your physician has recommended that you wear a holter monitor. Holter monitors are medical devices that record the heart's electrical activity. Doctors most often use these monitors to diagnose arrhythmias. Arrhythmias are problems with the speed or rhythm of the heartbeat. The monitor is a small, portable device. You can wear one while you do your normal daily activities. This is usually used to diagnose what is causing palpitations/syncope (passing out).  Follow-Up: Your physician wants you to follow-up in: 1 year with Dr. Radford Pax. You will receive a reminder letter in the mail two months in advance. If you don't receive a letter, please call our office to schedule the follow-up appointment.   Any Other Special Instructions Will Be Listed Below (If Applicable).

## 2015-01-26 ENCOUNTER — Ambulatory Visit (INDEPENDENT_AMBULATORY_CARE_PROVIDER_SITE_OTHER): Payer: Medicare PPO

## 2015-01-26 DIAGNOSIS — R001 Bradycardia, unspecified: Secondary | ICD-10-CM

## 2015-02-06 DIAGNOSIS — W57XXXA Bitten or stung by nonvenomous insect and other nonvenomous arthropods, initial encounter: Secondary | ICD-10-CM | POA: Diagnosis not present

## 2015-02-06 DIAGNOSIS — L299 Pruritus, unspecified: Secondary | ICD-10-CM | POA: Diagnosis not present

## 2015-02-10 DIAGNOSIS — L299 Pruritus, unspecified: Secondary | ICD-10-CM | POA: Diagnosis not present

## 2015-02-10 DIAGNOSIS — W57XXXS Bitten or stung by nonvenomous insect and other nonvenomous arthropods, sequela: Secondary | ICD-10-CM | POA: Diagnosis not present

## 2015-02-10 DIAGNOSIS — L259 Unspecified contact dermatitis, unspecified cause: Secondary | ICD-10-CM | POA: Diagnosis not present

## 2015-02-21 ENCOUNTER — Other Ambulatory Visit: Payer: Self-pay | Admitting: Cardiology

## 2015-05-12 ENCOUNTER — Other Ambulatory Visit: Payer: Self-pay

## 2015-05-12 DIAGNOSIS — Z1231 Encounter for screening mammogram for malignant neoplasm of breast: Secondary | ICD-10-CM

## 2015-06-12 ENCOUNTER — Ambulatory Visit
Admission: RE | Admit: 2015-06-12 | Discharge: 2015-06-12 | Disposition: A | Discharge status: Home / Self Care | Payer: Medicare Other | Source: Ambulatory Visit

## 2015-06-12 DIAGNOSIS — Z1231 Encounter for screening mammogram for malignant neoplasm of breast: Secondary | ICD-10-CM

## 2015-11-29 ENCOUNTER — Other Ambulatory Visit (HOSPITAL_COMMUNITY): Payer: Self-pay | Admitting: Internal Medicine

## 2015-11-29 ENCOUNTER — Other Ambulatory Visit: Payer: Self-pay | Admitting: Cardiology

## 2015-11-29 DIAGNOSIS — I6523 Occlusion and stenosis of bilateral carotid arteries: Secondary | ICD-10-CM

## 2015-12-07 ENCOUNTER — Inpatient Hospital Stay (HOSPITAL_COMMUNITY): Admission: RE | Admit: 2015-12-07 | Payer: Medicare Other | Source: Ambulatory Visit

## 2016-01-15 ENCOUNTER — Encounter: Payer: Self-pay | Admitting: Cardiology

## 2016-01-24 IMAGING — CR DG CHEST 2V
2 series · 2 of 2 positions shown · non-contrast
Comparison: 10/16/2013; 10/14/2013; 09/22/2013

CLINICAL DATA: Post aortic valve replacement

EXAM:
CHEST  2 VIEW

[w chest pa]
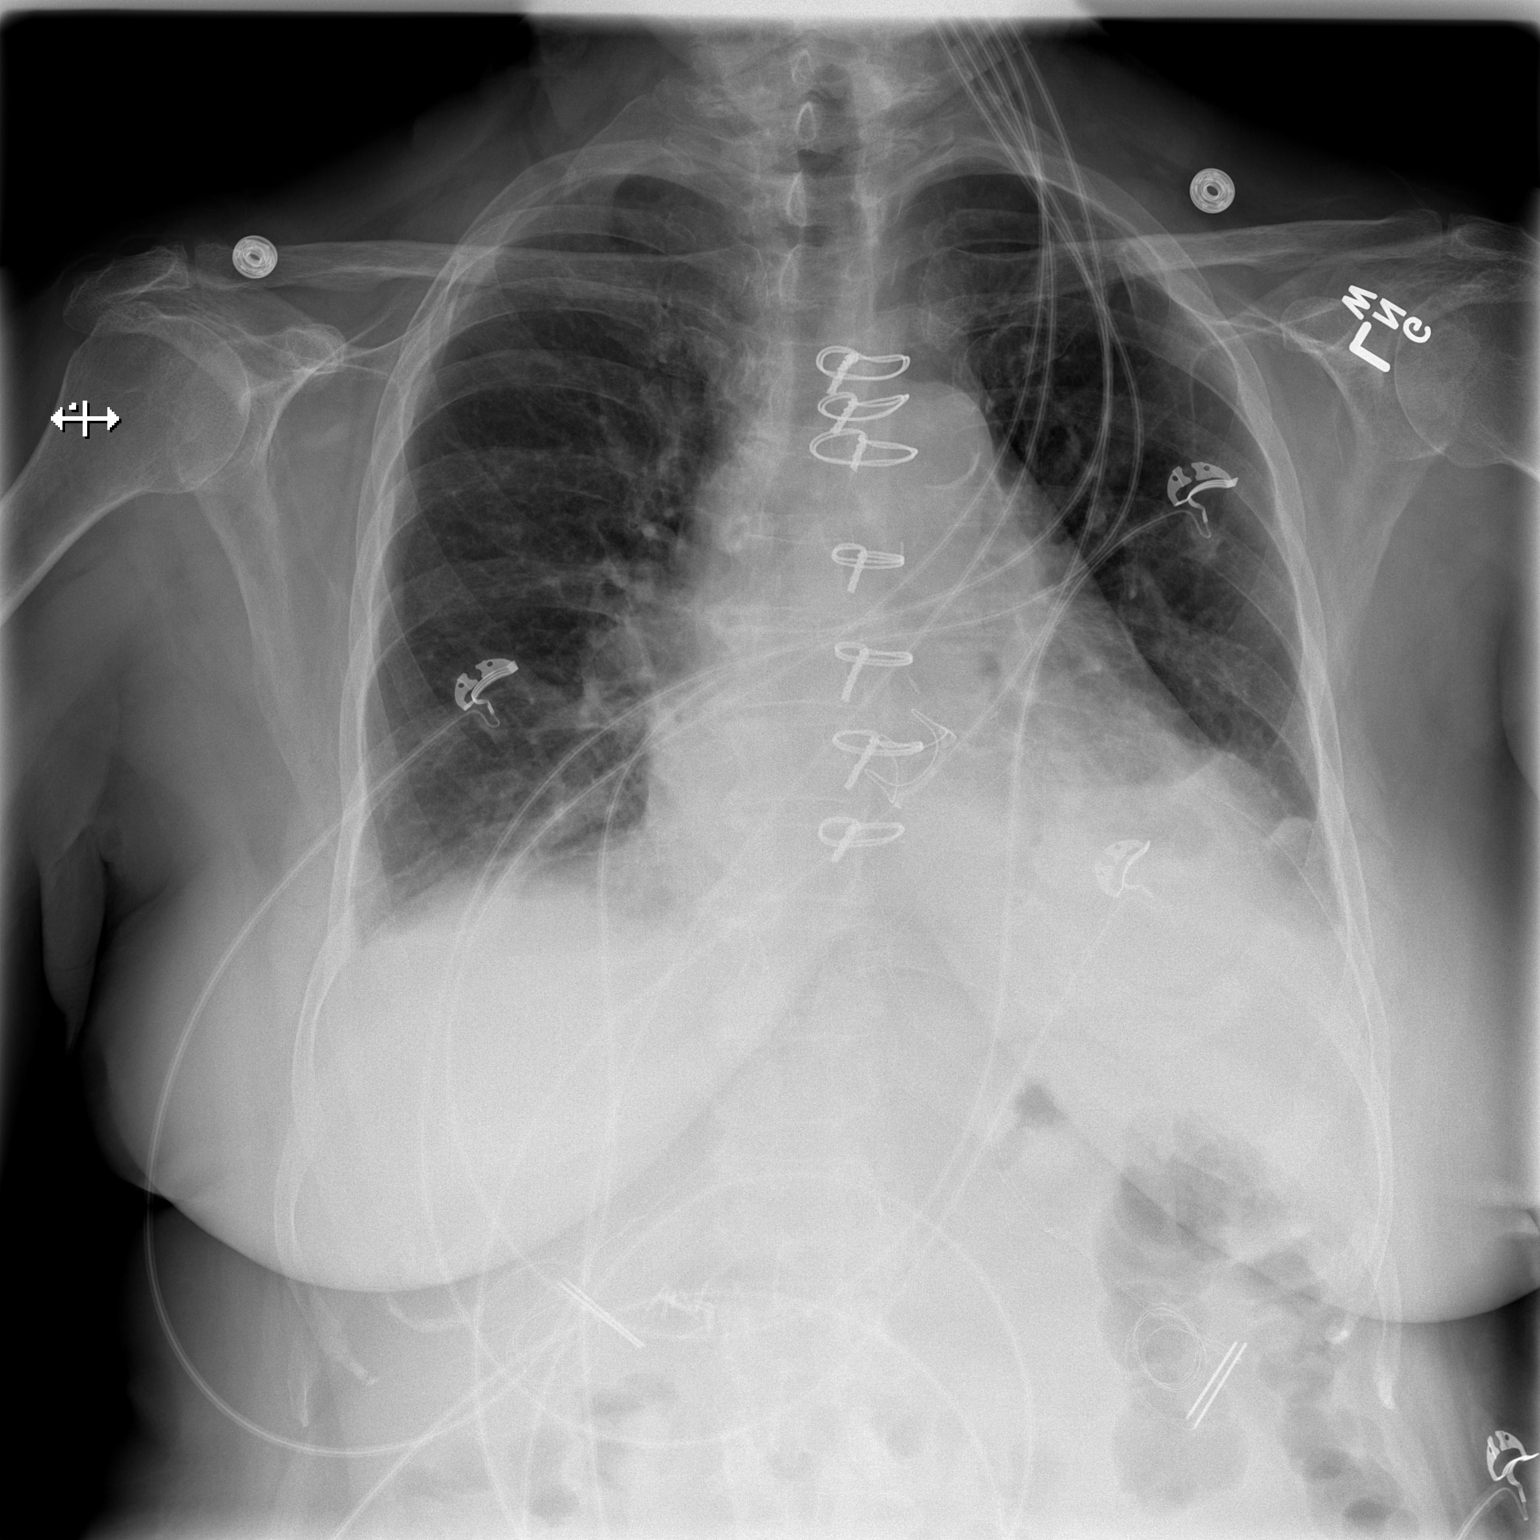

[w chest lat]
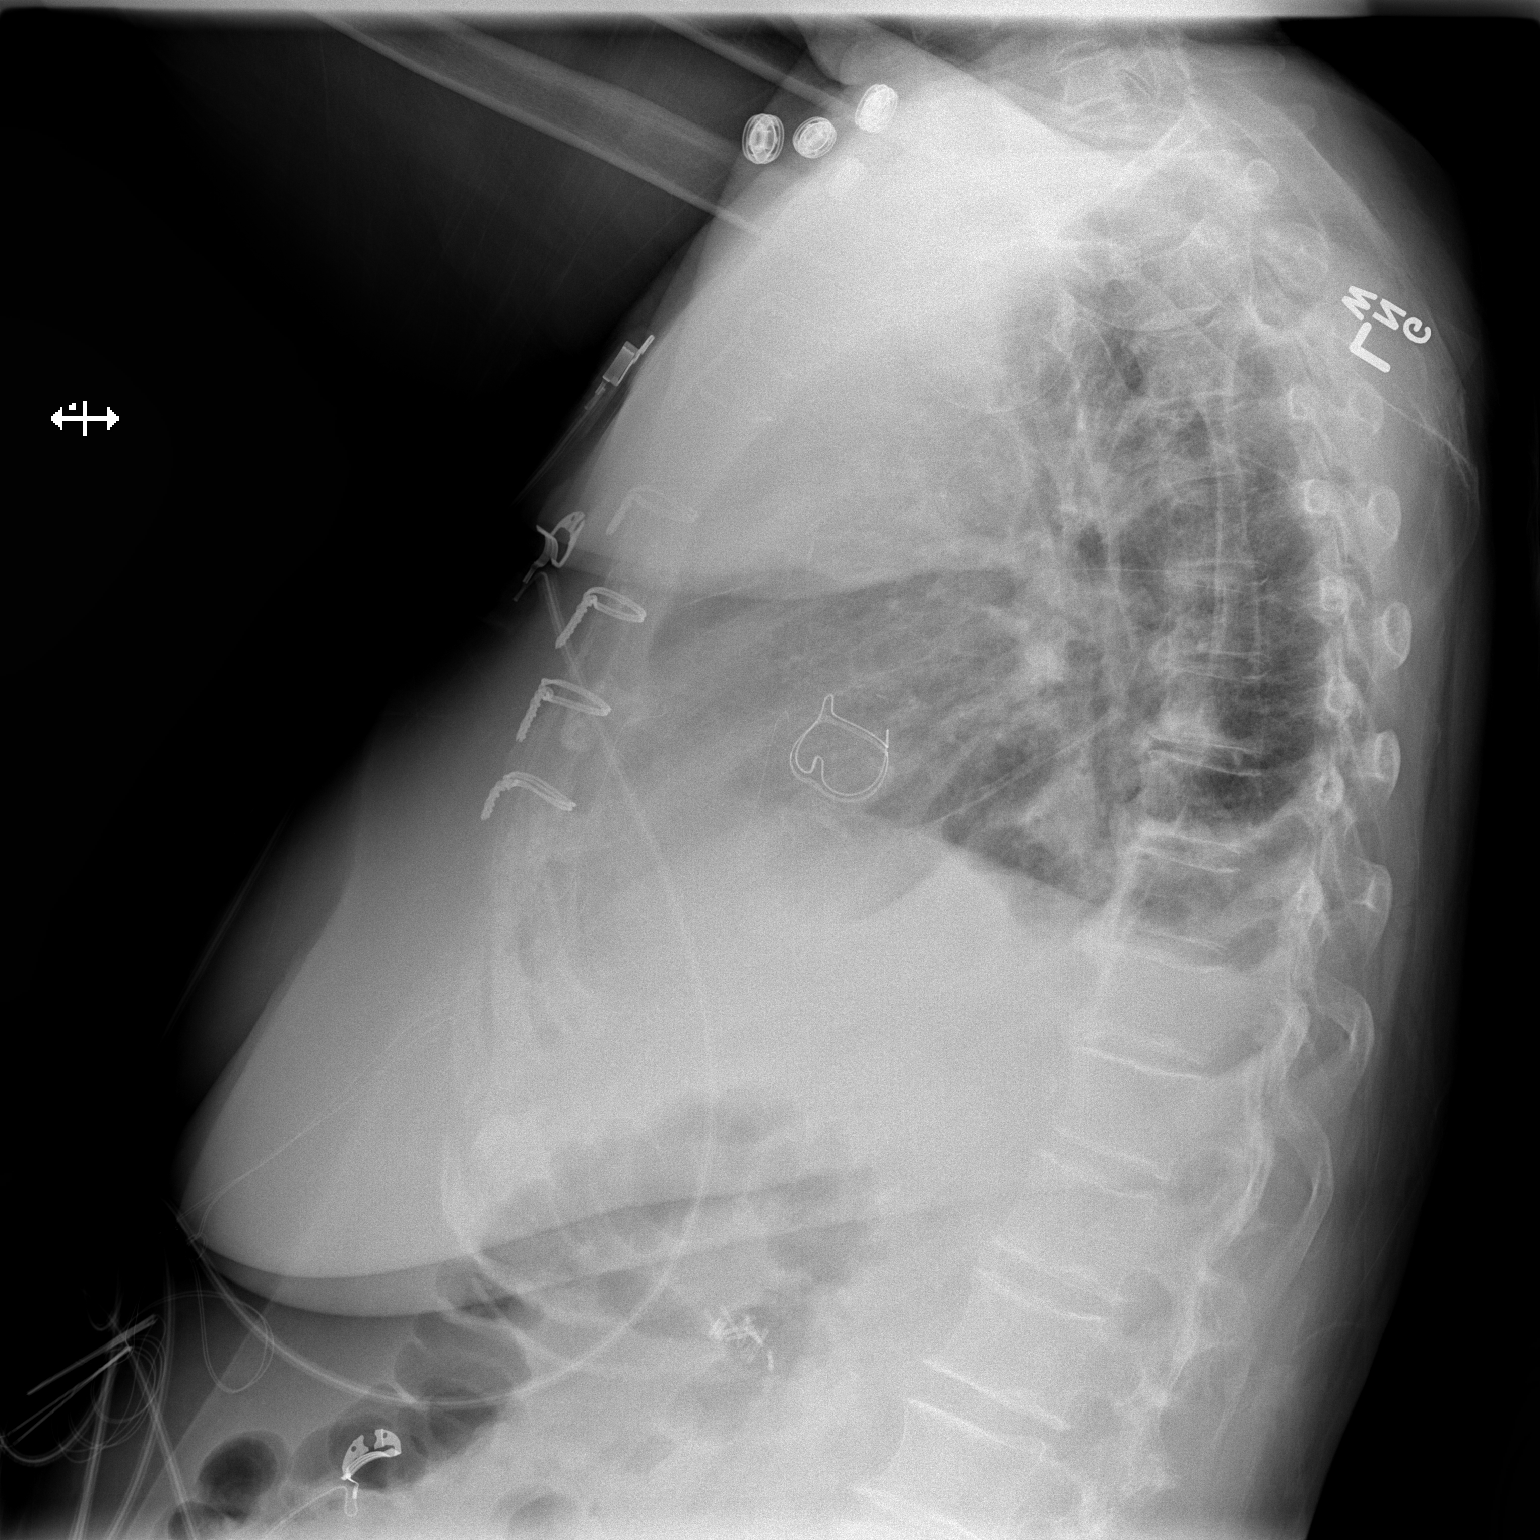

[2 of 2 positions shown; findings below may reference images not displayed]

FINDINGS: Grossly unchanged enlarged cardiac silhouette and mediastinal
contours post median sternotomy and aortic valve replacement.
Atherosclerotic plaque within the thoracic aorta. Interval removal
of right jugular approach vascular sheath. Improved aeration of the
lungs. Unchanged small bilateral effusions with associated bibasilar
opacities, likely atelectasis. Pulmonary venous congestion without
frank evidence of edema. No pneumothorax. Unchanged bones.
IMPRESSION: 1. Interval removal of support apparatus.  No pneumothorax.
2. Improved aeration along suggests resolving atelectasis and edema.
3. Unchanged small bilateral effusions with associated bibasilar
atelectasis, left greater than right.
4. Pulmonary venous congestion without frank evidence of edema.

## 2016-01-28 ENCOUNTER — Encounter: Payer: Self-pay | Admitting: Cardiology

## 2016-01-28 NOTE — Progress Notes (Signed)
Cardiology Office Note    Date:  01/29/2016   ID:  HAE AHLERS, DOB September 19, 1940, MRN [id number]  PCP:  Henrine Screws, MD  Cardiologist:  Fransico Him, MD   Chief Complaint  Patient presents with  . Aortic Stenosis  . Hypertension    History of Present Illness:  [name] is a 75 y.o. female with a history of HTN and aortic stenosis s/p AVR with a bioprosthetic valve by Dr. Cyndia Bent. She is doing well. She denies any SOB, DOE or chest pain. She denies orthopnea, PND, palpitations, dizziness, palpitations or edema. She gets very little exercise because she is the primary caregiver for her husband.   Past Medical History:  Diagnosis Date  . Abnormal vaginal Pap smear 1994   annual paps for years after that.more recently every other year,last in 2012-we agreed not to do them anymore  . Anxiety    no rx  . Aortic stenosis    s/p AVR with bioprosthesis  . 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/2015  . Carotid artery stenosis    a. Carotid US (05/2014):  R 60-79%; L 1-39% >> FU 6 mos; 2 (0.5 cm x 1.1 cm and 0.7 cm x 0.8 cm) thyroid masses >> needs FU with PCP;  b. Carotid US 8/16:  1-39% bilateral ICA stenosis.  Normal subclavian arteries, bilaterally.  -  f/u 2 years  . Coronary artery disease 2008   nonobstructive ASCAD with 30% LAD, normal coronary arteries 09/2013  . Hypercholesteremia   . Hypertension   . Obesity   . Osteopenia    declines treatment  . Pneumonia 1995  . Shoulder pain    Due to arthritis  . Vitamin D insufficiency     Past Surgical History:  Procedure Laterality Date  . AORTIC VALVE REPLACEMENT N/A 10/14/2013   Procedure: AORTIC VALVE REPLACEMENT (AVR);  Surgeon: Gaye Pollack, MD;  Location: [facility];  Service: Open Heart Surgery;  Laterality: N/A;  . CARDIAC CATHETERIZATION    . CHOLECYSTECTOMY  90  . INTRAOPERATIVE TRANSESOPHAGEAL ECHOCARDIOGRAM N/A 10/14/2013   Procedure: INTRAOPERATIVE TRANSESOPHAGEAL ECHOCARDIOGRAM;  Surgeon: Gaye Pollack, MD;  Location: [facility] OR;   Service: Open Heart Surgery;  Laterality: N/A;  . LEFT AND RIGHT HEART CATHETERIZATION WITH CORONARY ANGIOGRAM N/A 09/16/2013   Procedure: LEFT AND RIGHT HEART CATHETERIZATION WITH CORONARY ANGIOGRAM;  Surgeon: Sueanne Margarita, MD;  Location: [facility] CATH LAB;  Service: Cardiovascular;  Laterality: N/A;    Current Medications: Outpatient Medications Prior to Visit  Medication Sig Dispense Refill  . aspirin 81 MG tablet Take 81 mg by mouth every other day.     . Cholecalciferol (VITAMIN D) 2000 UNITS tablet Take 2,000 Units by mouth daily.     Marland Kitchen lisinopril (PRINIVIL,ZESTRIL) 10 MG tablet Take 10 mg by mouth daily.    Marland Kitchen spironolactone (ALDACTONE) 25 MG tablet TAKE ONE-HALF TABLET BY MOUTH ONCE DAILY 15 tablet 10   No facility-administered medications prior to visit.      Allergies:   Lipitor [atorvastatin]; Pravastatin; Simvastatin; Tramadol; Codeine; Penicillins; and Sulfa antibiotics   Social History   Social History  . Marital status: Married    Spouse name: N/A  . Number of children: N/A  . Years of education: N/A   Social History Main Topics  . Smoking status: Never Smoker  . Smokeless tobacco: Never Used  . Alcohol use No  . Drug use: No  . Sexual activity: Not Asked   Other Topics Concern  . None   Social  History Narrative  . None     Family History:  The patient's family history includes Arrhythmia in her sister; Dementia in her mother; Heart attack in her brother, father, and maternal grandfather; Heart disease in her brother and father.   ROS:   Please see the history of present illness.    ROS All other systems reviewed and are negative.  No flowsheet data found.     PHYSICAL EXAM:   VS:  BP (!) 150/86   Pulse (!) 53   Ht 5' 1.5" (1.562 m)   Wt 175 lb 12.8 oz (79.7 kg)   SpO2 98%   BMI 32.68 kg/m    GEN: Well nourished, well developed, in no acute distress  HEENT: normal  Neck: no JVD, carotid bruits, or masses Cardiac: RRR; no murmurs, rubs, or  gallops,no edema.  Intact distal pulses bilaterally.  Respiratory:  clear to auscultation bilaterally, normal work of breathing GI: soft, nontender, nondistended, + BS MS: no deformity or atrophy  Skin: warm and dry, no rash Neuro:  Alert and Oriented x 3, Strength and sensation are intact Psych: euthymic mood, full affect  Wt Readings from Last 3 Encounters:  01/29/16 175 lb 12.8 oz (79.7 kg)  01/25/15 178 lb (80.7 kg)  07/14/14 171 lb 12.8 oz (77.9 kg)      Studies/Labs Reviewed:   EKG:  EKG is ordered today.  The ekg ordered today demonstrates Sinus bradycardia at 47bpm with no ST changes and IRBBB  Recent Labs: No results found for requested labs within last 8760 hours.   Lipid Panel No results found for: CHOL, TRIG, HDL, CHOLHDL, VLDL, LDLCALC, LDLDIRECT  Additional studies/ records that were reviewed today include:  none    ASSESSMENT:    1. 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 Essential hypertension, benign   3. Bilateral carotid artery stenosis   4. Bradycardia      PLAN:  In order of problems listed above:  1. Severe AS secondary to bicuspid AV s/p bioprosthetic AVR. She is doing well.  It has been 2 years since last echo so will repeat to make sure AVR is stable.  Continue ASA. 2. HTN- BP controlled on current meds.  Continue ACE I and diuretic.  Check BMET. 3. Bilateral carotid artery disease - 1-39% bilateral- repeat carotid dopplers in 1 year.  Continue ASA.  Check FLP and ALT. 4. Bradycardia - asymptomatic.     Medication Adjustments/Labs and Tests Ordered: Current medicines are reviewed at length with the patient today.  Concerns regarding medicines are outlined above.  Medication changes, Labs and Tests ordered today are listed in the Patient Instructions below.  There are no Patient Instructions on file for this visit.   Signed, Fransico Him, MD  01/29/2016 8:56 AM    [facility] [address],  [place], [place]  [address] Phone: [phone]; Fax: [phone]

## 2016-01-29 ENCOUNTER — Encounter: Payer: Self-pay | Admitting: Cardiology

## 2016-01-29 ENCOUNTER — Ambulatory Visit (INDEPENDENT_AMBULATORY_CARE_PROVIDER_SITE_OTHER): Payer: Medicare Other | Admitting: Cardiology

## 2016-01-29 VITALS — BP 150/86 | HR 53 | Ht 61.5 in | Wt 175.8 lb

## 2016-01-29 DIAGNOSIS — Q230 Congenital stenosis of aortic valve: Secondary | ICD-10-CM

## 2016-01-29 DIAGNOSIS — R001 Bradycardia, unspecified: Secondary | ICD-10-CM

## 2016-01-29 DIAGNOSIS — I10 Essential (primary) hypertension: Secondary | ICD-10-CM

## 2016-01-29 DIAGNOSIS — I6523 Occlusion and stenosis of bilateral carotid arteries: Secondary | ICD-10-CM | POA: Diagnosis not present

## 2016-01-29 DIAGNOSIS — Q231 Congenital insufficiency of aortic valve: Secondary | ICD-10-CM

## 2016-01-29 LAB — LIPID PANEL
Cholesterol: 223 mg/dL — ABNORMAL HIGH (ref 125–200)
HDL: 40 mg/dL — AB (ref >=46)
LDL Cholesterol: 152 mg/dL — ABNORMAL HIGH (ref <130)
Total CHOL/HDL Ratio: 5.6 Ratio — ABNORMAL HIGH (ref <=5.0)
Triglycerides: 153 mg/dL — ABNORMAL HIGH (ref <150)
VLDL: 31 mg/dL — ABNORMAL HIGH (ref <30)

## 2016-01-29 LAB — HEPATIC FUNCTION PANEL
ALBUMIN: 3.9 g/dL (ref 3.6–5.1)
ALT: 19 U/L (ref 6–29)
AST: 18 U/L (ref 10–35)
Alkaline Phosphatase: 72 U/L (ref 33–130)
BILIRUBIN INDIRECT: 0.3 mg/dL (ref 0.2–1.2)
BILIRUBIN TOTAL: 0.4 mg/dL (ref 0.2–1.2)
Bilirubin, Direct: 0.1 mg/dL (ref <=0.2)
Total Protein: 6.1 g/dL (ref 6.1–8.1)

## 2016-01-29 LAB — BASIC METABOLIC PANEL
BUN: 17 mg/dL (ref 7–25)
CHLORIDE: 108 mmol/L (ref 98–110)
CO2: 28 mmol/L (ref 20–31)
Calcium: 8.6 mg/dL (ref 8.6–10.4)
Creat: 0.85 mg/dL (ref 0.60–0.93)
GLUCOSE: 94 mg/dL (ref 65–99)
Potassium: 3.8 mmol/L (ref 3.5–5.3)
Sodium: 142 mmol/L (ref 135–146)

## 2016-01-29 NOTE — Patient Instructions (Signed)
Medication Instructions:  Your physician recommends that you continue on your current medications as directed. Please refer to the Current Medication list given to you today.   Labwork: TODAY: BMET, LFTs, Lipids  Testing/Procedures: None  Follow-Up: Your physician wants you to follow-up in: 1 year with Dr. Turner. You will receive a reminder letter in the mail two months in advance. If you don't receive a letter, please call our office to schedule the follow-up appointment.   Any Other Special Instructions Will Be Listed Below (If Applicable).     If you need a refill on your cardiac medications before your next appointment, please call your pharmacy.   

## 2016-02-02 ENCOUNTER — Telehealth: Payer: Self-pay

## 2016-02-02 DIAGNOSIS — E785 Hyperlipidemia, unspecified: Secondary | ICD-10-CM

## 2016-02-02 MED ORDER — EZETIMIBE 10 MG PO TABS: 10.0000 mg | ORAL_TABLET | Freq: Every day | ORAL | 11 refills | Status: DC

## 2016-02-02 NOTE — Telephone Encounter (Signed)
Patient in the office today with her husband who has an OV. Patient states she tried Crestor years ago and did not tolerate it - so she refuses to try again. She agrees to try Zetia 10 mg daily and limit carbs, sweets and alcohol. FLP and ALT scheduled January 5. Patient agrees with treatment plan.

## 2016-02-02 NOTE — Telephone Encounter (Signed)
-----   Message from Sueanne Margarita, MD sent at 01/31/2016 12:48 PM EDT ----- Please see if patient is willing to try Crestor '5mg'$  qod

## 2016-03-02 IMAGING — CR DG CHEST 2V
2 series · 2 of 2 positions shown · non-contrast
Comparison: October 17, 2013

CLINICAL DATA: Aortic stenosis; hypertension

EXAM:
CHEST  2 VIEW

[w chest pa]
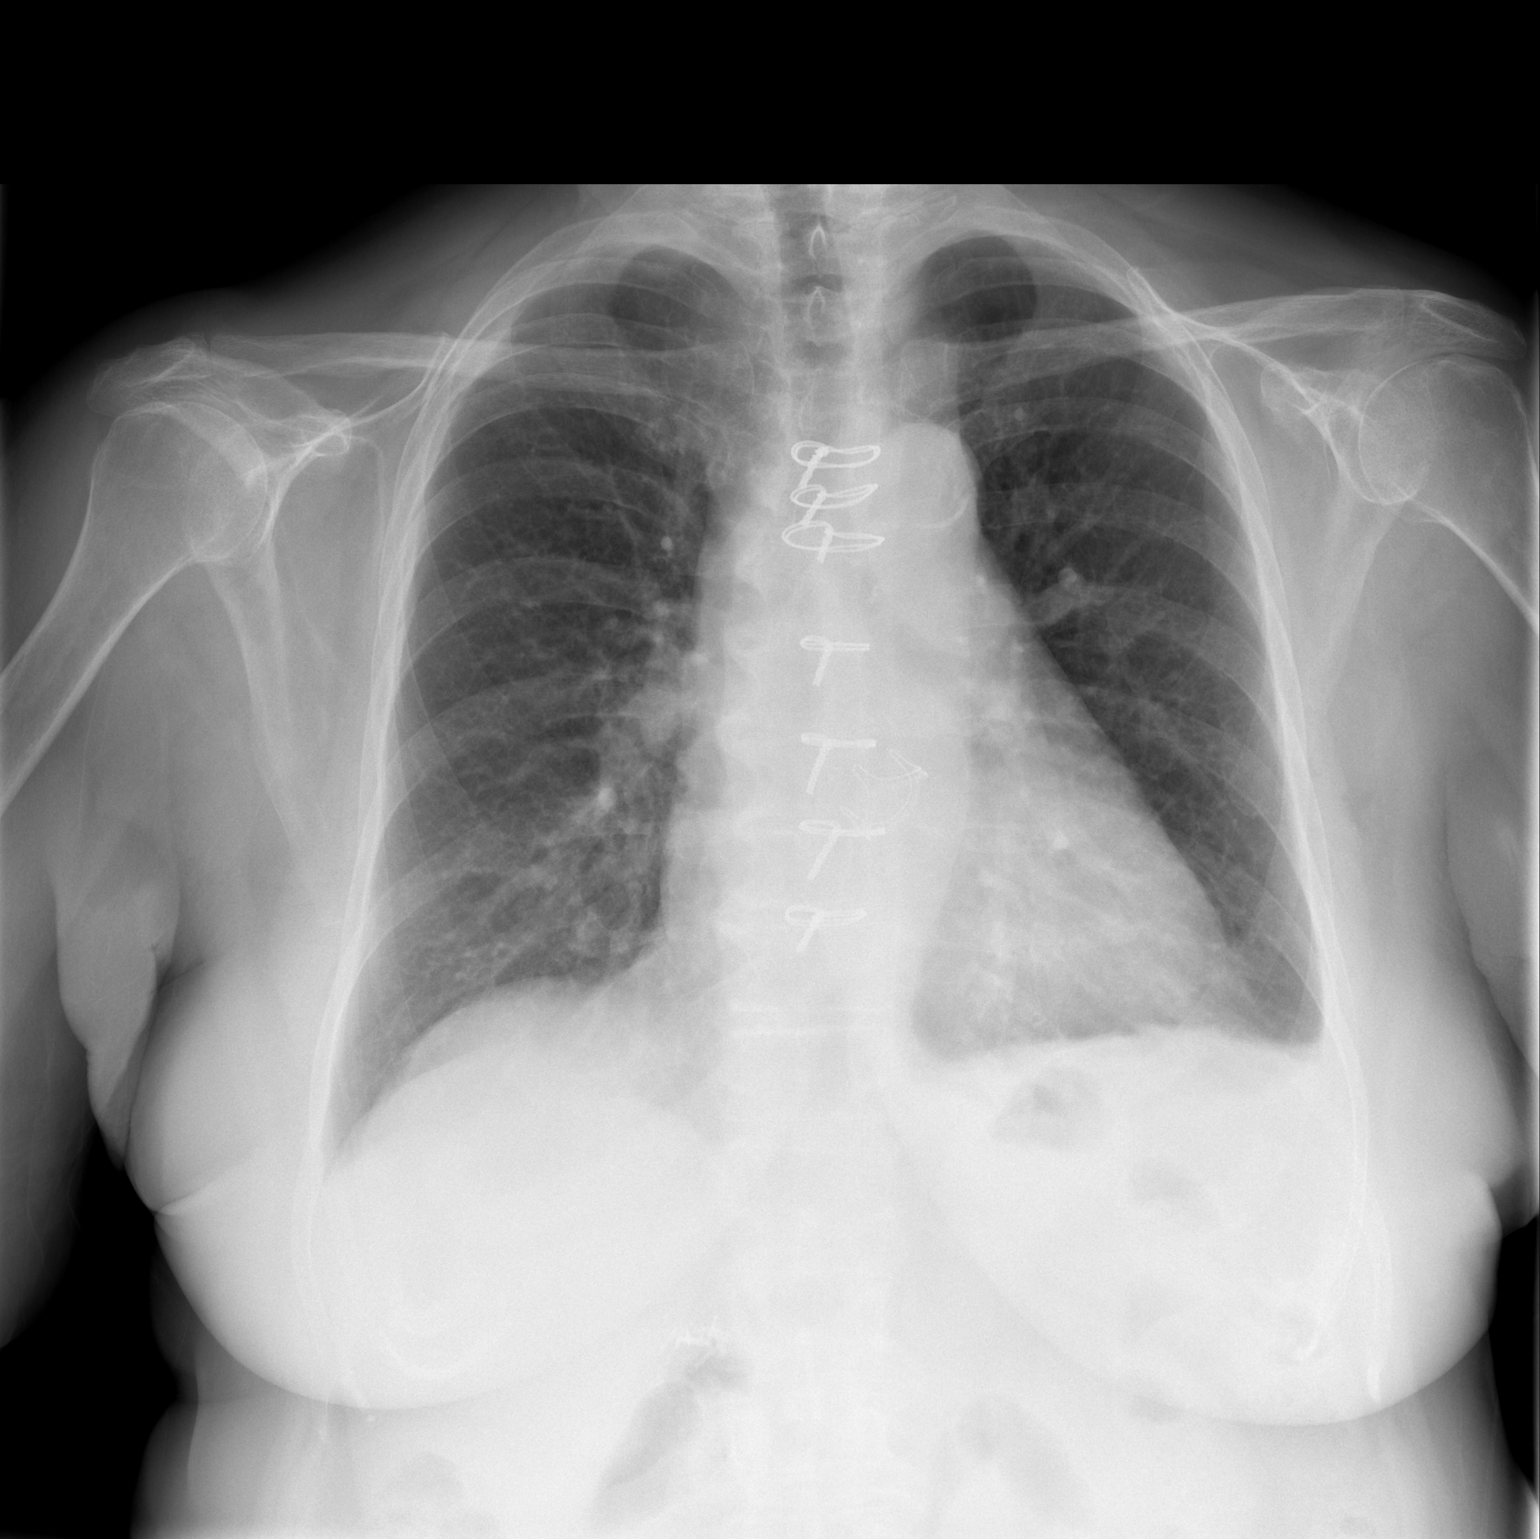

[w chest lat]
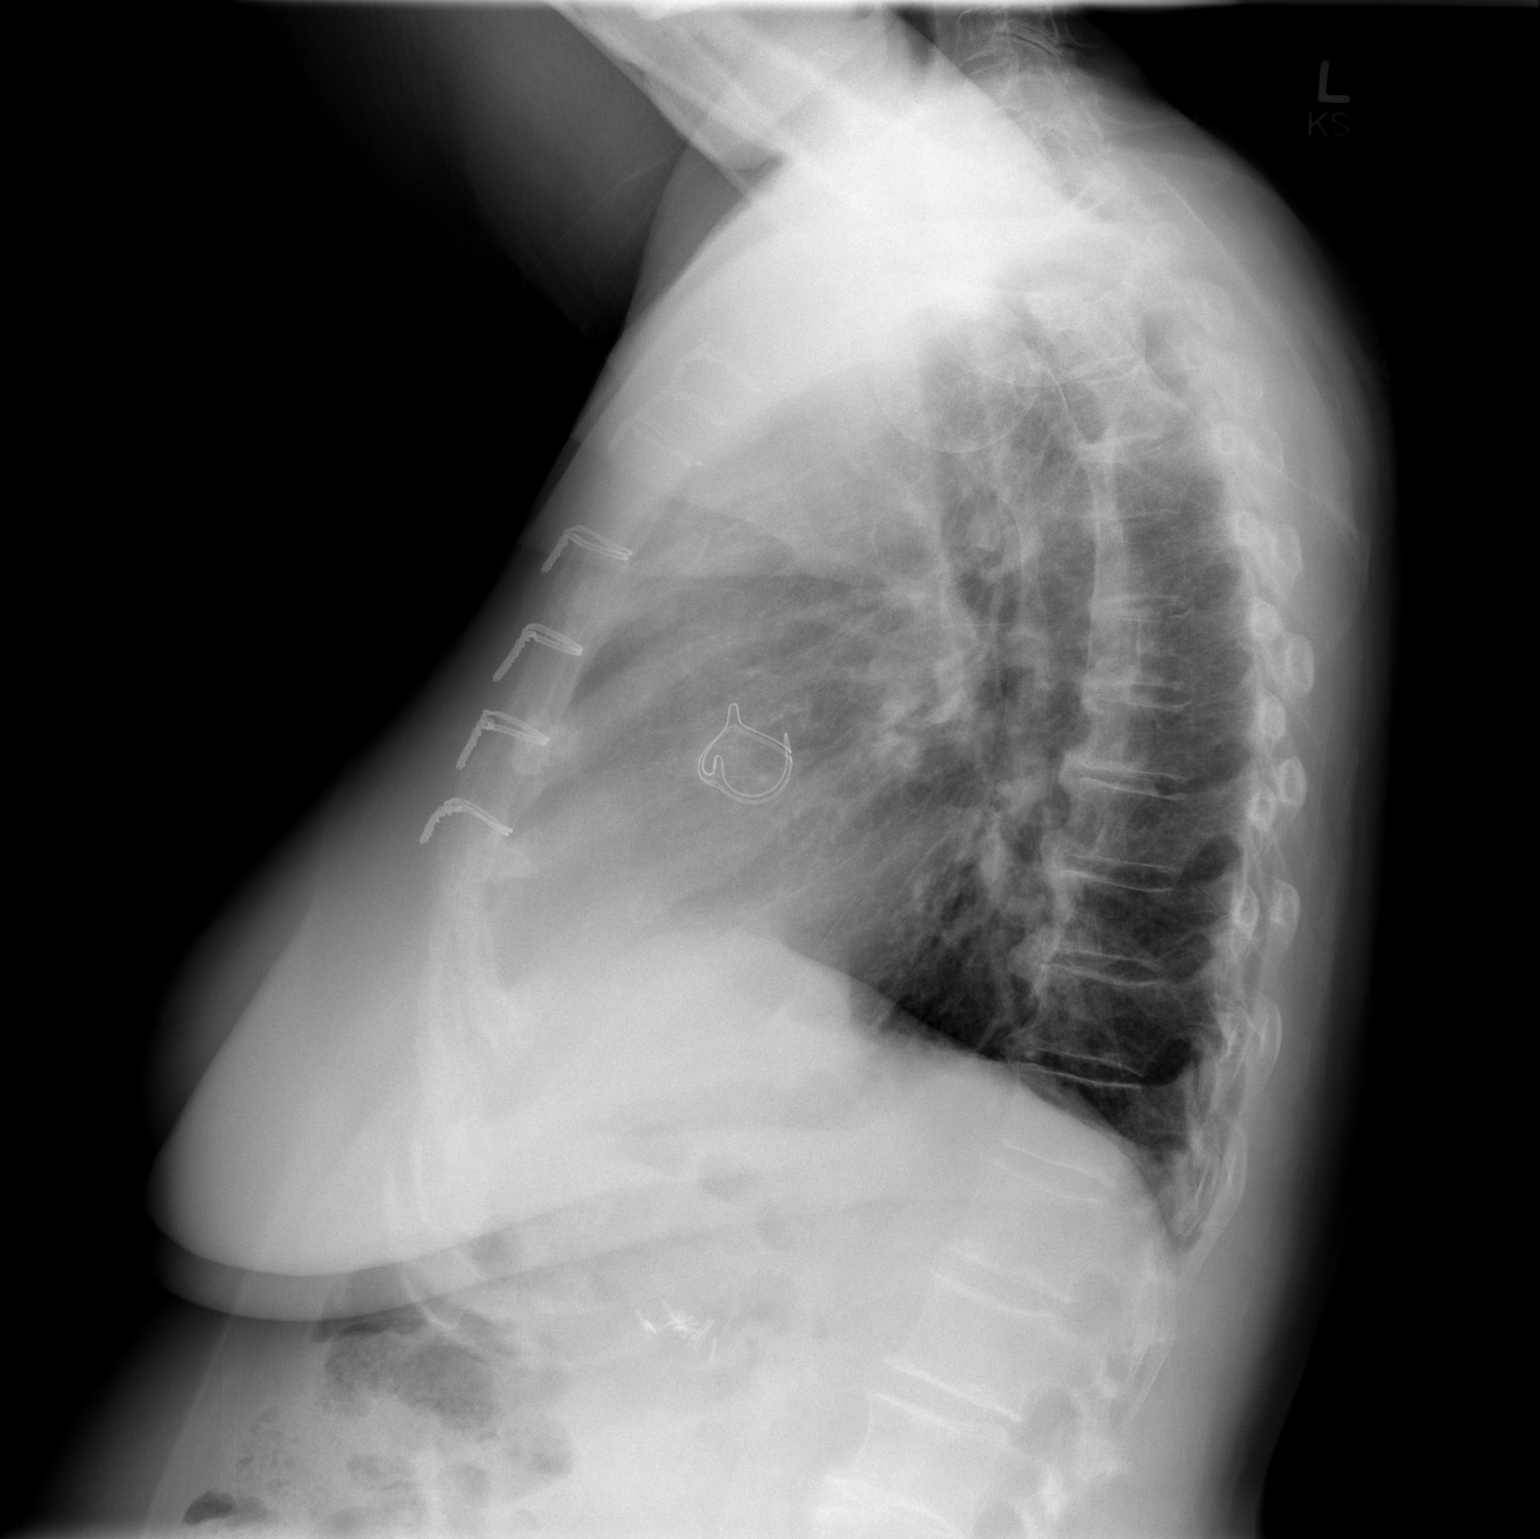

[2 of 2 positions shown; findings below may reference images not displayed]

FINDINGS: There is a minimal left effusion. Elsewhere lungs are clear. Heart
is borderline enlarged with pulmonary vascularity within normal
limits. No adenopathy. There is an aortic valve replacement. There
is atherosclerotic change in the aorta. There is degenerative change
in the thoracic spine.
IMPRESSION: Heart borderline enlarged with aortic valve replacement. Minimal
left effusion. No lung edema or consolidation.

## 2016-03-04 ENCOUNTER — Other Ambulatory Visit: Payer: Self-pay | Admitting: Cardiology

## 2016-04-12 ENCOUNTER — Other Ambulatory Visit: Payer: Medicare Other | Admitting: *Deleted

## 2016-04-12 DIAGNOSIS — Q230 Congenital stenosis of aortic valve: Secondary | ICD-10-CM

## 2016-04-12 DIAGNOSIS — I10 Essential (primary) hypertension: Secondary | ICD-10-CM

## 2016-04-12 DIAGNOSIS — R001 Bradycardia, unspecified: Secondary | ICD-10-CM

## 2016-04-12 DIAGNOSIS — I6521 Occlusion and stenosis of right carotid artery: Secondary | ICD-10-CM

## 2016-04-12 DIAGNOSIS — Q231 Congenital insufficiency of aortic valve: Secondary | ICD-10-CM

## 2016-04-13 LAB — LIPID PANEL
CHOL/HDL RATIO: 4.9 ratio — AB (ref 0.0–4.4)
Cholesterol, Total: 225 mg/dL — ABNORMAL HIGH (ref 100–199)
HDL: 46 mg/dL (ref >39)
LDL Calculated: 144 mg/dL — ABNORMAL HIGH (ref 0–99)
TRIGLYCERIDES: 173 mg/dL — AB (ref 0–149)
VLDL Cholesterol Cal: 35 mg/dL (ref 5–40)

## 2016-04-13 LAB — ALT: ALT: 25 IU/L (ref 0–32)

## 2016-04-15 NOTE — Progress Notes (Signed)
Patient ID: [name]                 DOB: 09/10/1940                    MRN: [id number]     HPI: [name] is a 76 y.o. female patient referred to lipid clinic by Dr Radford Pax. PMH is significant for HTN, carotid artery stenosis with 1-39% bilateral ICA stenosis s/p AVR with bioprosthetic valve, CAD with 30% LAD, HLD, and obesity. She is intolerant to unknown doses of atorvastatin, pravastatin, and simvastatin and presents today for further lipid management.  Pt reports that she experienced muscle aches in her legs within 2 weeks of starting each statin medication. Her symptoms resolved upon drug discontinuation each time. She does not recall doses and I was unable to confirm any doses with her PCP Dr Inda Merlin. Most recently, she was started on Zetia '10mg'$  daily in October 2017. Pt reports that she took Zetia for 2 months before stopping it last week. She noticed similar myalgias within the first month of therapy. Her lipid panel from last week reflects pt on Zetia therapy. Of note, pt has [employer]. Pt's husband passed away a month ago - this has affected her diet and exercise routine.  Current Medications: none Intolerances: atorvastatin, pravastatin, simvastatin - myalgias. Called PCP Dr Josetta Huddle and no doses available. Zetia '10mg'$  daily - myalgias. Risk Factors: ICA stenosis, CAD, age LDL goal: '70mg'$ /dL  Diet: Tries to limit fried food and red meat.  Exercise: Used to walk 2 miles per day, however has stopped recently due to her husband's recent passing.  Family History: Arrhythmia in her sister; Dementia in her mother; Heart attack in her brother, father, and maternal grandfather; Heart disease in her brother and father.   Social History: Denies tobacco, alcohol, and illicit drug use.  Labs: 04/12/16: TC 225, TG 173, HDL 46, LDL 144 (on Zetia '10mg'$  daily - has since stopped therapy)  Past Medical History:  Diagnosis Date  . Abnormal vaginal Pap smear 1994   annual paps for  years after that.more recently every other year,last in 2012-we agreed not to do them anymore  . Anxiety    no rx  . Aortic stenosis    s/p AVR with bioprosthesis  . 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/2015  . Carotid artery stenosis    a. Carotid US (05/2014):  R 60-79%; L 1-39% >> FU 6 mos; 2 (0.5 cm x 1.1 cm and 0.7 cm x 0.8 cm) thyroid masses >> needs FU with PCP;  b. Carotid US 8/16:  1-39% bilateral ICA stenosis.  Normal subclavian arteries, bilaterally.  -  f/u 2 years  . Coronary artery disease 2008   nonobstructive ASCAD with 30% LAD, normal coronary arteries 09/2013  . Hypercholesteremia   . Hypertension   . Obesity   . Osteopenia    declines treatment  . Pneumonia 1995  . Shoulder pain    Due to arthritis  . Vitamin D insufficiency     Current Outpatient Prescriptions on File Prior to Visit  Medication Sig Dispense Refill  . aspirin 81 MG tablet Take 81 mg by mouth every other day.     . Cholecalciferol (VITAMIN D) 2000 UNITS tablet Take 2,000 Units by mouth daily.     Marland Kitchen ezetimibe (ZETIA) 10 MG tablet Take 1 tablet (10 mg total) by mouth daily. 30 tablet 11  . lisinopril (PRINIVIL,ZESTRIL) 10 MG tablet Take 10 mg by mouth daily.    Marland Kitchen  spironolactone (ALDACTONE) 25 MG tablet TAKE ONE-HALF TABLET BY MOUTH ONCE DAILY. 15 tablet 10   No current facility-administered medications on file prior to visit.     Allergies  Allergen Reactions  . Lipitor [Atorvastatin] Other (See Comments)    Stomach pain  . Pravastatin Other (See Comments)    Myalgias   . Simvastatin Other (See Comments)    Myalgias   . Tramadol Other (See Comments)    FEELS LOOPY  . Codeine Palpitations  . Penicillins Palpitations  . Sulfa Antibiotics Palpitations    Assessment/Plan:  1. Hyperlipidemia - LDL on Zetia '10mg'$  daily is '144mg'$ /dL above goal '70mg'$ /dL. Pt has since stopped Zetia therapy secondary to myalgias, so her baseline LDL is likely 160-170. Pt is intolerant to unknown doses of simvastatin, atorvastatin,  and pravastatin. She is willing to rechallenge with rosuvastatin '5mg'$  daily and will f/u with tolerability in 2 weeks. If pt does not tolerate rosuvastatin, she is interested in clinical trials at [facility]. She would be a good candidate for CLEAR, ORION-10, or neurocog trial.   [name], PharmD, CPP, [place] [address] [place], [place] [address] Phone: [phone]; Fax: [phone] 04/16/2016 11:08 AM

## 2016-04-16 ENCOUNTER — Ambulatory Visit: Payer: Medicare Other

## 2016-04-16 ENCOUNTER — Ambulatory Visit (INDEPENDENT_AMBULATORY_CARE_PROVIDER_SITE_OTHER): Payer: Medicare Other | Admitting: Pharmacist

## 2016-04-16 DIAGNOSIS — E785 Hyperlipidemia, unspecified: Secondary | ICD-10-CM

## 2016-04-16 MED ORDER — ROSUVASTATIN CALCIUM 5 MG PO TABS: 5.0000 mg | ORAL_TABLET | Freq: Every day | ORAL | 11 refills | Status: DC

## 2016-04-16 NOTE — Patient Instructions (Signed)
Start taking Crestor (rosuvastatin) '5mg'$  once a day.  Call Megan in lipid clinic with tolerability [phone].  We can discuss lipid clinical trials if you do not tolerate the Crestor.

## 2016-04-30 ENCOUNTER — Telehealth: Payer: Self-pay | Admitting: Pharmacist

## 2016-04-30 NOTE — Telephone Encounter (Signed)
Called pt to follow up on Crestor tolerability. States she took Crestor for 4-5 days and it caused myalgias in her arms and legs. Advised pt to stop taking Crestor. Discussed clinical trials with pt at last office visit on 04/16/16 due to intolerances to atorvastatin, pravastatin, simvastatin, and Zetia. Looks like pt should qualify for either ORION-10, Regeneron neurocog study, or CLEAR. Pt is interested in learning more. Will forward pt's information to our research nurses to further screen pt.

## 2016-05-02 ENCOUNTER — Encounter: Payer: Self-pay | Admitting: *Deleted

## 2016-05-02 DIAGNOSIS — Z006 Encounter for examination for normal comparison and control in clinical research program: Secondary | ICD-10-CM

## 2016-05-02 NOTE — Progress Notes (Signed)
Spoke with patient regarding 3 different research trials, Clear, Regeneron and Orion.  She prefers to be screened for CLEAR and is scheduled for May 13, 2016 at 0830 for S1-W-5 visit.

## 2016-05-10 ENCOUNTER — Other Ambulatory Visit: Payer: Self-pay | Admitting: Internal Medicine

## 2016-05-10 DIAGNOSIS — Z1231 Encounter for screening mammogram for malignant neoplasm of breast: Secondary | ICD-10-CM

## 2016-05-13 ENCOUNTER — Encounter: Payer: Self-pay | Admitting: Cardiology

## 2016-05-15 ENCOUNTER — Telehealth: Payer: Self-pay | Admitting: Cardiology

## 2016-05-15 ENCOUNTER — Encounter: Payer: Self-pay | Admitting: *Deleted

## 2016-05-15 DIAGNOSIS — I10 Essential (primary) hypertension: Secondary | ICD-10-CM

## 2016-05-15 DIAGNOSIS — Z006 Encounter for examination for normal comparison and control in clinical research program: Secondary | ICD-10-CM

## 2016-05-15 MED ORDER — HYDROCHLOROTHIAZIDE 12.5 MG PO CAPS: 12.5000 mg | ORAL_CAPSULE | Freq: Every day | ORAL | 11 refills | Status: DC

## 2016-05-15 NOTE — Telephone Encounter (Signed)
nEW mESSAGE     PT HAS HIGH POTASSIUM LEVELS PLEASE CALL

## 2016-05-15 NOTE — Progress Notes (Signed)
Spoke with Lenice Llamas RN regarding potassium level of 5.2. Scanned results to [facility].

## 2016-05-15 NOTE — Telephone Encounter (Signed)
Instructed patient to STOP ALDACTONE and START HCTZ 12.5 mg daily. BMET scheduled 2/12. Patient agrees with treatment plan.

## 2016-05-15 NOTE — Telephone Encounter (Addendum)
Spoke with Camila Li, RN who states Research trial labs were drawn today and patient's K was 5.2. Camila Li called because Dr. Lia Foyer wanted to point out patient is on Lisinopril and Aldactone.  Labs received - confirmed K is 5.2.   To Dr. Radford Pax for recommendations.

## 2016-05-15 NOTE — Telephone Encounter (Signed)
Please change aldactone to HCTZ 12.'5mg'$  daily and repeat BMET on Monday 2/12

## 2016-05-16 ENCOUNTER — Encounter: Payer: Self-pay | Admitting: *Deleted

## 2016-05-16 DIAGNOSIS — Z006 Encounter for examination for normal comparison and control in clinical research program: Secondary | ICD-10-CM

## 2016-05-16 NOTE — Progress Notes (Signed)
Subject met inclusion and exclusion criteria.  The informed consent form, study requirements and expectations were reviewed with the subject and questions and concerns were addressed prior to the signing of the consent form.  The subject verbalized understanding of the trail requirements.  The subject agreed to participate in the CLEAR trial and signed the informed consent.  The informed consent was obtained prior to performance of any protocol-specific procedures for the subject.  A copy of the signed informed consent was given to the subject and a copy was placed in the subject's medical record.    Jake Bathe, RN [phone]

## 2016-05-20 ENCOUNTER — Other Ambulatory Visit: Payer: Medicare Other | Admitting: *Deleted

## 2016-05-20 DIAGNOSIS — I10 Essential (primary) hypertension: Secondary | ICD-10-CM

## 2016-05-20 LAB — BASIC METABOLIC PANEL
BUN/Creatinine Ratio: 15 (ref 12–28)
BUN: 14 mg/dL (ref 8–27)
CO2: 26 mmol/L (ref 18–29)
Calcium: 9.3 mg/dL (ref 8.7–10.3)
Chloride: 99 mmol/L (ref 96–106)
Creatinine, Ser: 0.96 mg/dL (ref 0.57–1.00)
GFR calc Af Amer: 66 mL/min/{1.73_m2} (ref >59)
GFR calc non Af Amer: 57 mL/min/{1.73_m2} — ABNORMAL LOW (ref >59)
GLUCOSE: 91 mg/dL (ref 65–99)
Potassium: 4.1 mmol/L (ref 3.5–5.2)
Sodium: 142 mmol/L (ref 134–144)

## 2016-05-22 ENCOUNTER — Other Ambulatory Visit: Payer: Self-pay | Admitting: *Deleted

## 2016-05-22 MED ORDER — AMBULATORY NON FORMULARY MEDICATION: 180.0000 mg | Freq: Every day | Status: DC

## 2016-06-04 NOTE — Telephone Encounter (Signed)
Mrs. Scarlett is enrolled in the [facility].

## 2016-06-14 ENCOUNTER — Ambulatory Visit
Admission: RE | Admit: 2016-06-14 | Discharge: 2016-06-14 | Disposition: A | Discharge status: Home / Self Care | Payer: Medicare Other | Source: Ambulatory Visit | Attending: Internal Medicine | Admitting: Internal Medicine

## 2016-06-14 DIAGNOSIS — Z1231 Encounter for screening mammogram for malignant neoplasm of breast: Secondary | ICD-10-CM

## 2016-06-18 ENCOUNTER — Other Ambulatory Visit: Payer: Self-pay | Admitting: Internal Medicine

## 2016-06-18 DIAGNOSIS — R928 Other abnormal and inconclusive findings on diagnostic imaging of breast: Secondary | ICD-10-CM

## 2016-06-21 ENCOUNTER — Ambulatory Visit
Admission: RE | Admit: 2016-06-21 | Discharge: 2016-06-21 | Disposition: A | Discharge status: Home / Self Care | Payer: Medicare Other | Source: Ambulatory Visit | Attending: Internal Medicine | Admitting: Internal Medicine

## 2016-06-21 DIAGNOSIS — R928 Other abnormal and inconclusive findings on diagnostic imaging of breast: Secondary | ICD-10-CM

## 2016-07-09 ENCOUNTER — Other Ambulatory Visit: Payer: Self-pay | Admitting: Internal Medicine

## 2016-07-09 DIAGNOSIS — I6523 Occlusion and stenosis of bilateral carotid arteries: Secondary | ICD-10-CM

## 2016-07-17 ENCOUNTER — Ambulatory Visit
Admission: RE | Admit: 2016-07-17 | Discharge: 2016-07-17 | Disposition: A | Discharge status: Home / Self Care | Payer: Medicare Other | Source: Ambulatory Visit | Attending: Internal Medicine | Admitting: Internal Medicine

## 2016-07-17 DIAGNOSIS — I6523 Occlusion and stenosis of bilateral carotid arteries: Secondary | ICD-10-CM

## 2016-07-23 ENCOUNTER — Encounter: Payer: Self-pay | Admitting: *Deleted

## 2016-07-23 DIAGNOSIS — Z006 Encounter for examination for normal comparison and control in clinical research program: Secondary | ICD-10-CM

## 2016-07-23 NOTE — Progress Notes (Signed)
Clear Study  I saw patient for 1 month study visit. Patient is tolerating study medication well and was 100% compliant with study drug. I will see patient back in 2 months for next study visit.

## 2016-09-24 ENCOUNTER — Encounter: Payer: Self-pay | Admitting: *Deleted

## 2016-09-24 DIAGNOSIS — Z006 Encounter for examination for normal comparison and control in clinical research program: Secondary | ICD-10-CM

## 2016-09-24 NOTE — Progress Notes (Signed)
Patient into Research clinic for T3M3 visit for the CLEAR research study.  No c/o, no aes or saes to report.  >80% compliant with meds and new bottle of study drug administered.  Re-consented to [place] version 4.1 [id number] Local version [id number]

## 2016-10-16 ENCOUNTER — Telehealth: Payer: Self-pay | Admitting: *Deleted

## 2016-10-16 NOTE — Telephone Encounter (Signed)
Spoke with [name] after she had an office visit with her PCP.  PCP felt rash resulted from sun exposure since subject is taking hydrochlorothiazide and lisinopril.  Subject will resume study drug in the morning.

## 2016-10-16 NOTE — Telephone Encounter (Signed)
Mrs. Yinger called this morning to report that she has a rash on her neck and face that appeared several days ago.  She was wondering if it could be connected with her CLEAR study drug, bempedoic acid 180 mg daily versus a placebo. She was randomized to either active drug or placebo on 06/20/2016. She is going to hold her study drug and make an appointment to see her PCP. She was able to get an appointment today.  She also stated that she had been out in the sun.  She is currently prescribed hydrochlorothiazide and lisinopril both with some photosensitivity warnings.

## 2016-12-19 NOTE — Progress Notes (Unsigned)
Mrs. Ploch completed her [place] [facility] Visit for the [facility] Medications were reviewed, adverse events assessed and labs completed per protocol. BP 150/52 and 133/45. Pulse 57, Weight 178 lbs Medication compliance was 87%. Patient informed of upcoming 3 month telephone call and 6 Month clinic visit. Subject had no complaints at this visit and is tolerating study medication without adverse events.  Blossom Hoops, RN, Research Coordinator [phone]

## 2017-01-27 ENCOUNTER — Encounter: Payer: Self-pay | Admitting: Cardiology

## 2017-01-27 NOTE — Progress Notes (Signed)
Cardiology Office Note:    Date:  01/28/2017   ID:  [name], DOB 1940-04-23, MRN [id number]  PCP:  Josetta Huddle, MD  Cardiologist:  Fransico Him, MD   Referring MD: Josetta Huddle, MD   Chief Complaint  Patient presents with  . Aortic Stenosis  . Hypertension    History of Present Illness:    [name] is a 76 y.o. female with a hx of HTN and aortic stenosis s/p AVR with a bioprosthetic valve by Dr. Cyndia Bent.  She is here today for followup and is doing well.  She denies any chest pain or pressure, SOB, DOE, PND, orthopnea, LE edema (except when she is on her feet for long periods of time), dizziness, palpitations or syncope. She is compliant with her meds and is tolerating meds with no SE.  She has had bradycardia in the past and says that she does not really get tired unless she really pushes herself and she is not limited in her activities.    Past Medical History:  Diagnosis Date  . Abnormal vaginal Pap smear 1994   annual paps for years after that.more recently every other year,last in 2012-we agreed not to do them anymore  . Anxiety    no rx  . Aortic stenosis    s/p AVR with bioprosthesis  . 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/2015  . Carotid artery stenosis    < 50% stenosis bilaterally by doppler 07/2016  . Coronary artery disease 2008   nonobstructive ASCAD with 30% LAD, normal coronary arteries 09/2013  . Hypercholesteremia   . Hypertension   . Obesity   . Osteopenia    declines treatment  . Pneumonia 1995  . Shoulder pain    Due to arthritis  . Vitamin D insufficiency     Past Surgical History:  Procedure Laterality Date  . AORTIC VALVE REPLACEMENT N/A 10/14/2013   Procedure: AORTIC VALVE REPLACEMENT (AVR);  Surgeon: Gaye Pollack, MD;  Location: [facility];  Service: Open Heart Surgery;  Laterality: N/A;  . CARDIAC CATHETERIZATION    . CHOLECYSTECTOMY  90  . INTRAOPERATIVE TRANSESOPHAGEAL ECHOCARDIOGRAM N/A 10/14/2013   Procedure: INTRAOPERATIVE TRANSESOPHAGEAL  ECHOCARDIOGRAM;  Surgeon: Gaye Pollack, MD;  Location: [facility] OR;  Service: Open Heart Surgery;  Laterality: N/A;  . LEFT AND RIGHT HEART CATHETERIZATION WITH CORONARY ANGIOGRAM N/A 09/16/2013   Procedure: LEFT AND RIGHT HEART CATHETERIZATION WITH CORONARY ANGIOGRAM;  Surgeon: Sueanne Margarita, MD;  Location: [facility] CATH LAB;  Service: Cardiovascular;  Laterality: N/A;    Current Medications: Current Meds  Medication Sig  . AMBULATORY NON FORMULARY MEDICATION Take 180 mg by mouth daily. Medication Name: bempedoic acid versus placebo CLEAR Research Study drug provided  . aspirin 81 MG tablet Take 81 mg by mouth every other day.   . Cholecalciferol (VITAMIN D) 2000 UNITS tablet Take 2,000 Units by mouth daily.   . hydrochlorothiazide (MICROZIDE) 12.5 MG capsule Take 1 capsule (12.5 mg total) by mouth daily.  Marland Kitchen lisinopril (PRINIVIL,ZESTRIL) 10 MG tablet Take 10 mg by mouth daily.     Allergies:   Crestor [rosuvastatin calcium]; Lipitor [atorvastatin]; Pravastatin; Simvastatin; Tramadol; Zetia [ezetimibe]; Codeine; Penicillins; and Sulfa antibiotics   Social History   Social History  . Marital status: Married    Spouse name: N/A  . Number of children: N/A  . Years of education: N/A   Social History Main Topics  . Smoking status: Never Smoker  . Smokeless tobacco: Never Used  . Alcohol use No  . Drug  use: No  . Sexual activity: Not Asked   Other Topics Concern  . None   Social History Narrative  . None     Family History: The patient's family history includes Arrhythmia in her sister; Dementia in her mother; Heart attack in her brother, father, and maternal grandfather; Heart disease in her brother and father.  ROS:   Please see the history of present illness.    ROS  All other systems reviewed and negative.   EKGs/Labs/Other Studies Reviewed:    The following studies were reviewed today: none  EKG:  EKG is  ordered today.  The ekg ordered today demonstrates sinus bradycardia at  46bpm IRBB  Recent Labs: 04/12/2016: ALT 25 05/20/2016: BUN 14; Creatinine, Ser 0.96; Potassium 4.1; Sodium 142   Recent Lipid Panel    Component Value Date/Time   CHOL 225 (H) 04/12/2016 1222   TRIG 173 (H) 04/12/2016 1222   HDL 46 04/12/2016 1222   CHOLHDL 4.9 (H) 04/12/2016 1222   CHOLHDL 5.6 (H) 01/29/2016 0910   VLDL 31 (H) 01/29/2016 0910   LDLCALC 144 (H) 04/12/2016 1222    Physical Exam:    VS:  BP (!) 142/70   Pulse (!) 46   Ht 5' 1.5" (1.562 m)   Wt 182 lb 6.4 oz (82.7 kg)   BMI 33.91 kg/m     Wt Readings from Last 3 Encounters:  01/28/17 182 lb 6.4 oz (82.7 kg)  09/24/16 174 lb (78.9 kg)  07/23/16 181 lb (82.1 kg)     GEN:  Well nourished, well developed in no acute distress HEENT: Normal NECK: No JVD; No carotid bruits LYMPHATICS: No lymphadenopathy CARDIAC: RRR, no murmurs, rubs, gallops RESPIRATORY:  Clear to auscultation without rales, wheezing or rhonchi  ABDOMEN: Soft, non-tender, non-distended MUSCULOSKELETAL:  No edema; No deformity  SKIN: Warm and dry NEUROLOGIC:  Alert and oriented x 3 PSYCHIATRIC:  Normal affect   ASSESSMENT:    1. 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 Essential hypertension, benign   3. Bilateral carotid artery stenosis   4. Pure hypercholesterolemia   5. Bradycardia    PLAN:    In order of problems listed above:  1.  Severe AS s/p AVR by Dr. Cyndia Bent. She is doing well.  Echo 2015 with normal functioning bioprosthetic AVR.  She will continue on ASA 81mg  daily.    2.  HTN - BP is well controlled on exam.  She will continue on ACE I and diuretic. I will check a BMET.  3.  Bilateral carotid artery stenosis - dopplers 07/2016 showed < 50% stenosis bilaterally.  She will continue ASA.    4.  Hyperlipidemia with LDL goal < 70.  She is intolerant to statins and is on Zetia.  I will repeat FLP and ALT.  5.  Bradycardia - HR 46bpm today and she is asymptomatic   Medication Adjustments/Labs and Tests  Ordered: Current medicines are reviewed at length with the patient today.  Concerns regarding medicines are outlined above.  No orders of the defined types were placed in this encounter.  No orders of the defined types were placed in this encounter.   Signed, Fransico Him, MD  01/28/2017 9:33 AM    [place]

## 2017-01-28 ENCOUNTER — Ambulatory Visit (INDEPENDENT_AMBULATORY_CARE_PROVIDER_SITE_OTHER): Payer: Medicare Other | Admitting: Cardiology

## 2017-01-28 ENCOUNTER — Encounter: Payer: Self-pay | Admitting: Cardiology

## 2017-01-28 VITALS — BP 142/70 | HR 46 | Ht 61.5 in | Wt 182.4 lb

## 2017-01-28 DIAGNOSIS — E7800 Pure hypercholesterolemia, unspecified: Secondary | ICD-10-CM

## 2017-01-28 DIAGNOSIS — I6523 Occlusion and stenosis of bilateral carotid arteries: Secondary | ICD-10-CM | POA: Diagnosis not present

## 2017-01-28 DIAGNOSIS — I10 Essential (primary) hypertension: Secondary | ICD-10-CM | POA: Diagnosis not present

## 2017-01-28 DIAGNOSIS — Z23 Encounter for immunization: Secondary | ICD-10-CM

## 2017-01-28 DIAGNOSIS — Q231 Congenital insufficiency of aortic valve: Secondary | ICD-10-CM

## 2017-01-28 DIAGNOSIS — Q230 Congenital stenosis of aortic valve: Secondary | ICD-10-CM

## 2017-01-28 DIAGNOSIS — R001 Bradycardia, unspecified: Secondary | ICD-10-CM

## 2017-01-28 LAB — LIPID PANEL
CHOL/HDL RATIO: 4 ratio (ref 0.0–4.4)
Cholesterol, Total: 168 mg/dL (ref 100–199)
HDL: 42 mg/dL (ref >39)
LDL CALC: 98 mg/dL (ref 0–99)
Triglycerides: 142 mg/dL (ref 0–149)
VLDL CHOLESTEROL CAL: 28 mg/dL (ref 5–40)

## 2017-01-28 LAB — TSH: TSH: 1.97 u[IU]/mL (ref 0.450–4.500)

## 2017-01-28 LAB — HEPATIC FUNCTION PANEL
ALBUMIN: 4.3 g/dL (ref 3.5–4.8)
ALK PHOS: 57 IU/L (ref 39–117)
ALT: 19 IU/L (ref 0–32)
AST: 20 IU/L (ref 0–40)
BILIRUBIN, DIRECT: 0.09 mg/dL (ref 0.00–0.40)
Bilirubin Total: 0.3 mg/dL (ref 0.0–1.2)
TOTAL PROTEIN: 6.7 g/dL (ref 6.0–8.5)

## 2017-01-28 LAB — BASIC METABOLIC PANEL
BUN/Creatinine Ratio: 23 (ref 12–28)
BUN: 23 mg/dL (ref 8–27)
CALCIUM: 9.3 mg/dL (ref 8.7–10.3)
CO2: 26 mmol/L (ref 20–29)
CREATININE: 1 mg/dL (ref 0.57–1.00)
Chloride: 101 mmol/L (ref 96–106)
GFR calc Af Amer: 62 mL/min/{1.73_m2} (ref >59)
GFR, EST NON AFRICAN AMERICAN: 54 mL/min/{1.73_m2} — AB (ref >59)
Glucose: 87 mg/dL (ref 65–99)
Potassium: 4.1 mmol/L (ref 3.5–5.2)
Sodium: 141 mmol/L (ref 134–144)

## 2017-01-28 MED ORDER — LISINOPRIL 10 MG PO TABS: 10.0000 mg | ORAL_TABLET | Freq: Every day | ORAL | 3 refills | Status: DC

## 2017-01-28 MED ORDER — HYDROCHLOROTHIAZIDE 12.5 MG PO CAPS: 12.5000 mg | ORAL_CAPSULE | Freq: Every day | ORAL | 3 refills | Status: DC

## 2017-01-28 NOTE — Patient Instructions (Signed)
Medication Instructions:  Your physician recommends that you continue on your current medications as directed. Please refer to the Current Medication list given to you today.   Labwork: Today for BMET, TSH, LFTs, and lipids  Testing/Procedures: None ordered  Follow-Up: Your physician wants you to follow-up in: 1 year f/u with Dr. Radford Pax. You will receive a reminder letter in the mail two months in advance. If you don't receive a letter, please call our office to schedule the follow-up appointment.   Any Other Special Instructions Will Be Listed Below (If Applicable).     If you need a refill on your cardiac medications before your next appointment, please call your pharmacy.

## 2017-03-07 ENCOUNTER — Encounter: Payer: Self-pay | Admitting: *Deleted

## 2017-03-07 DIAGNOSIS — Z006 Encounter for examination for normal comparison and control in clinical research program: Secondary | ICD-10-CM

## 2017-03-07 NOTE — Progress Notes (Addendum)
This is entered as a late entry. The original visit was conducted by Blossom Hoops, RN on Sep. 13, 2018. Subject to Research clinic for visit T4-M6 in the clear research trial.  No c/o, aes or saes to report. Subject 83% compliant with meds and new drug dispensed.  Phone call follow up and next clinic visit scheduled.

## 2017-04-11 ENCOUNTER — Telehealth: Payer: Self-pay | Admitting: *Deleted

## 2017-04-11 NOTE — Telephone Encounter (Signed)
Late entry:  Ms [name] called our Research office on March 20, 2017 @ [id number].  She was going into a meeting at [place] and did not want to miss our scheduled phone call for visit T5-M9 in the Clear research study..  No c/o, aes or saes to report.  Her next scheduled appointment was confirmed.

## 2017-05-19 ENCOUNTER — Other Ambulatory Visit: Payer: Self-pay | Admitting: Internal Medicine

## 2017-05-19 DIAGNOSIS — Z1231 Encounter for screening mammogram for malignant neoplasm of breast: Secondary | ICD-10-CM

## 2017-06-24 ENCOUNTER — Ambulatory Visit
Admission: RE | Admit: 2017-06-24 | Discharge: 2017-06-24 | Disposition: A | Discharge status: Home / Self Care | Payer: Medicare Other | Source: Ambulatory Visit | Attending: Internal Medicine | Admitting: Internal Medicine

## 2017-06-24 DIAGNOSIS — Z1231 Encounter for screening mammogram for malignant neoplasm of breast: Secondary | ICD-10-CM

## 2017-06-26 ENCOUNTER — Encounter: Payer: Self-pay | Admitting: *Deleted

## 2017-06-26 DIAGNOSIS — Z006 Encounter for examination for normal comparison and control in clinical research program: Secondary | ICD-10-CM

## 2017-06-26 NOTE — Progress Notes (Signed)
Late entry: Subject to Research clinic 20Mar2019 for visit T6-M12 in the Clear research study.  No c/o, aes or saes to report. 90% compliant with meds and new drug dispensed.  Next f/u phone call and clinic visit scheduled.

## 2017-09-15 ENCOUNTER — Telehealth: Payer: Self-pay | Admitting: *Deleted

## 2017-09-15 NOTE — Telephone Encounter (Signed)
Subject contacted for visit T7-M15 in the Clear Research study.  No c/os, aes or saes to report.  Next clinic appointment confirmed.

## 2017-12-23 ENCOUNTER — Encounter: Payer: Medicare Other | Admitting: *Deleted

## 2017-12-23 IMAGING — US US CAROTID DUPLEX BILAT
1 series · 13 of 24 positions shown · non-contrast
Comparison: None available

CLINICAL DATA: Bilateral carotid atherosclerosis, hyperlipidemia,
coronary disease, remote aortic valve replacement

EXAM:
BILATERAL CAROTID DUPLEX ULTRASOUND
TECHNIQUE: Gray scale imaging, color Doppler and duplex ultrasound were
performed of bilateral carotid and vertebral arteries in the neck.

[Series 1: us carotid duplex bilat · 0.06mm/px · 13 of 67 slices shown]
[im 1/67]
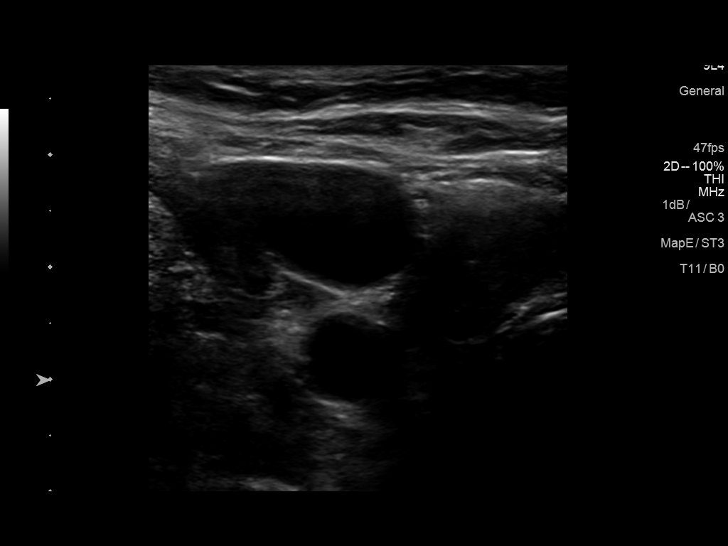
[im 6/67]
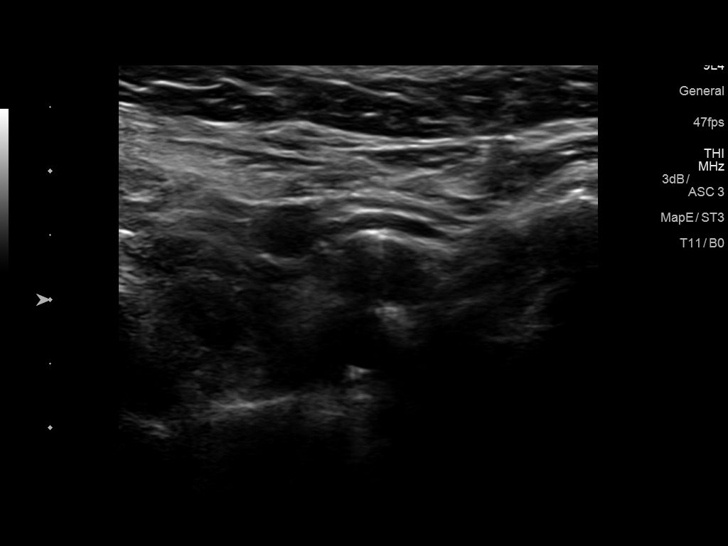
[im 12/67]
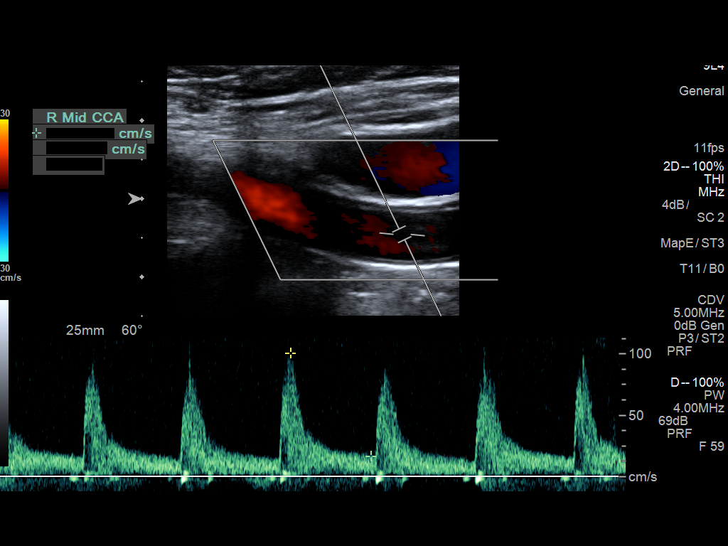
[im 18/67]
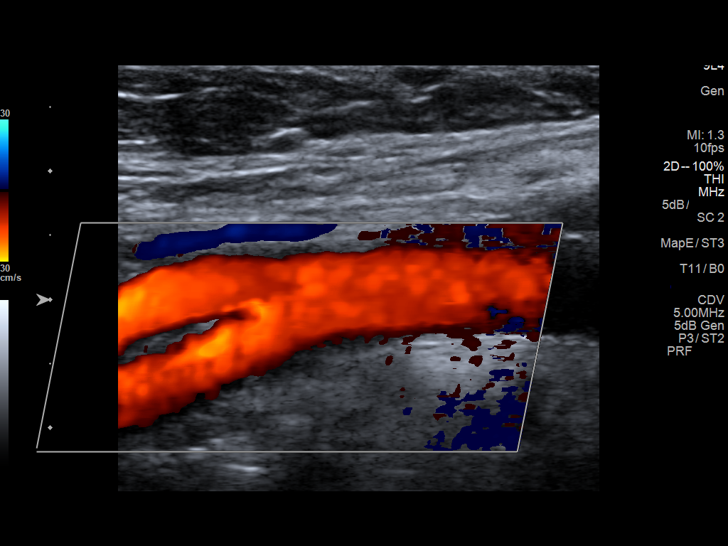
[im 23/67]
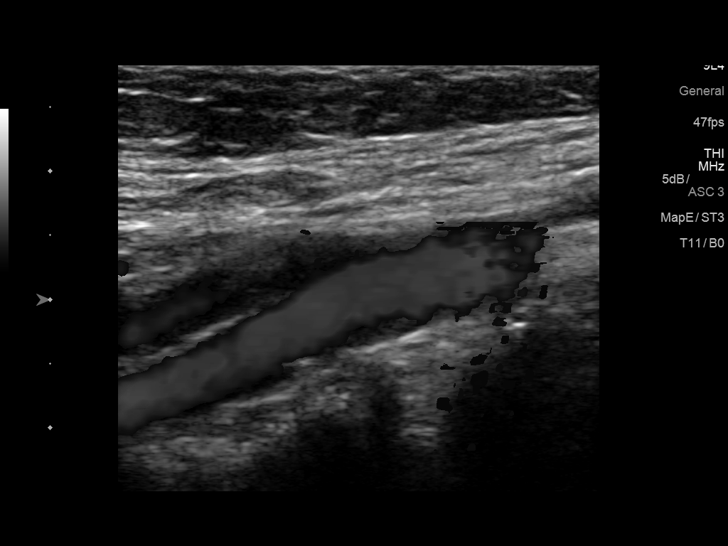
[im 29/67]
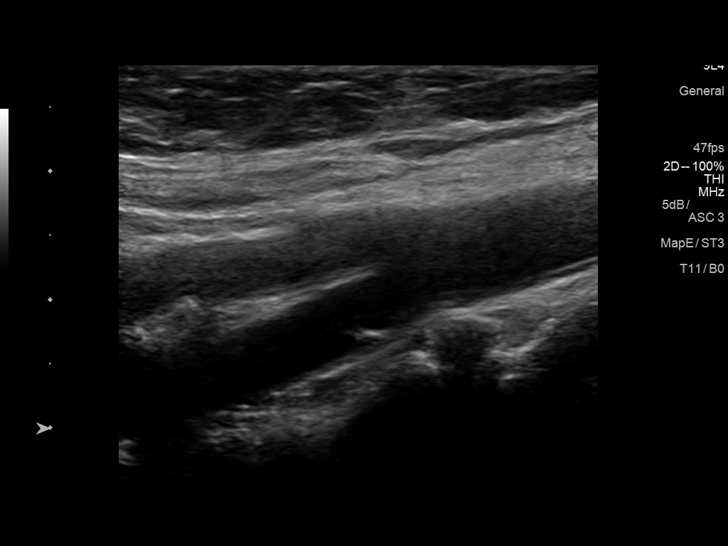
[im 35/67]
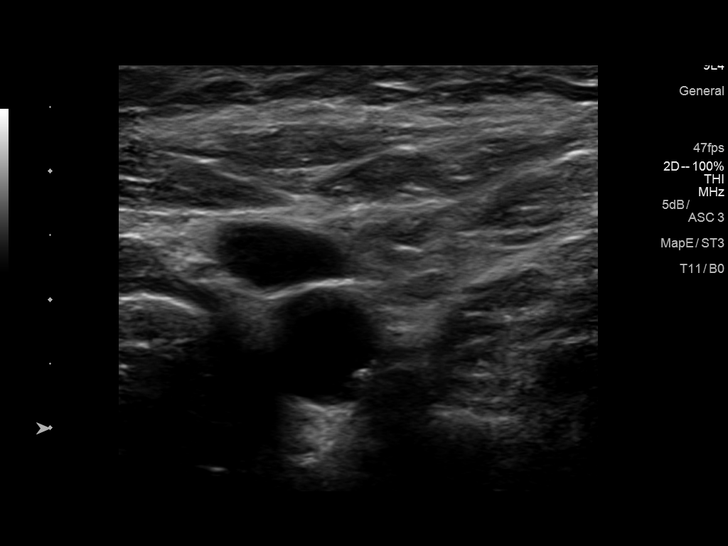
[im 38/67]
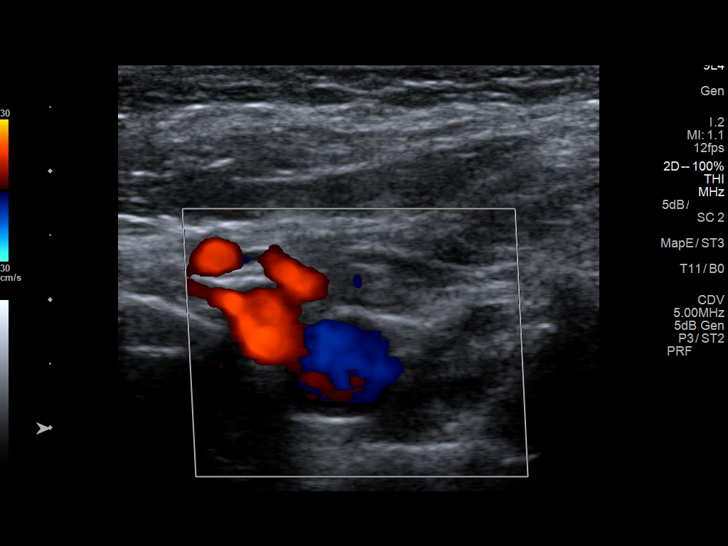
[im 44/67]
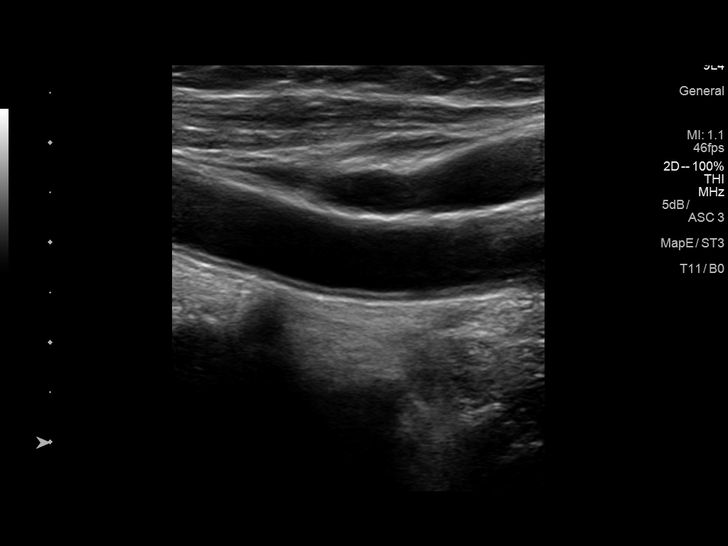
[im 49/67]
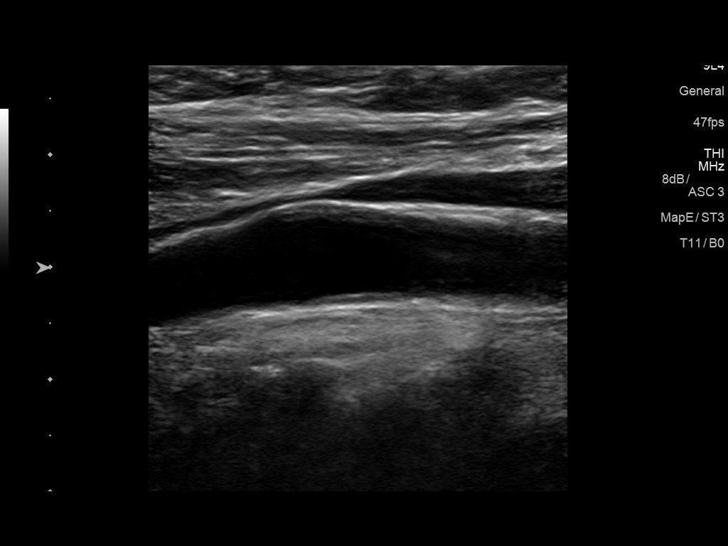
[im 55/67]
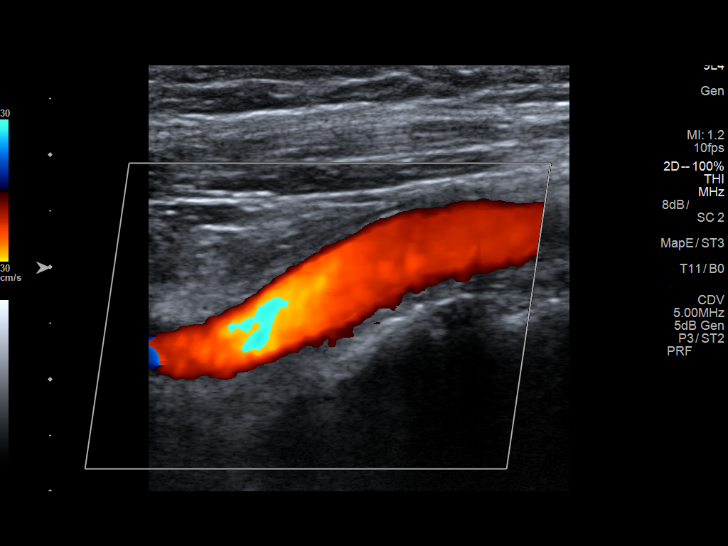
[im 61/67]
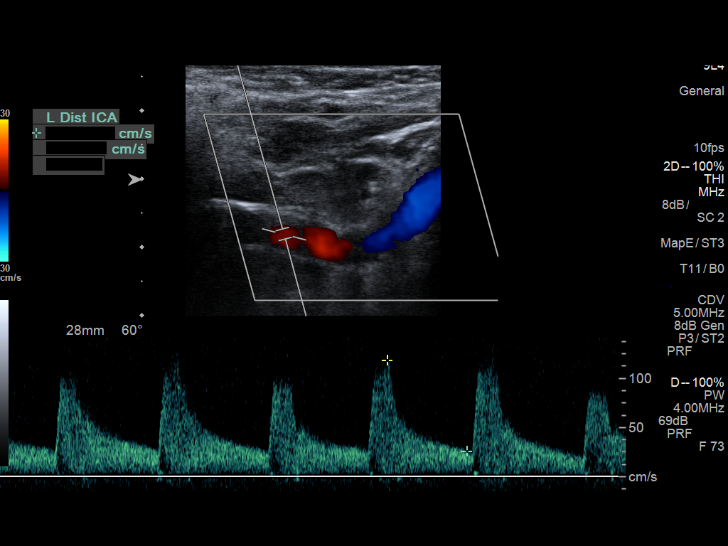
[im 67/67]
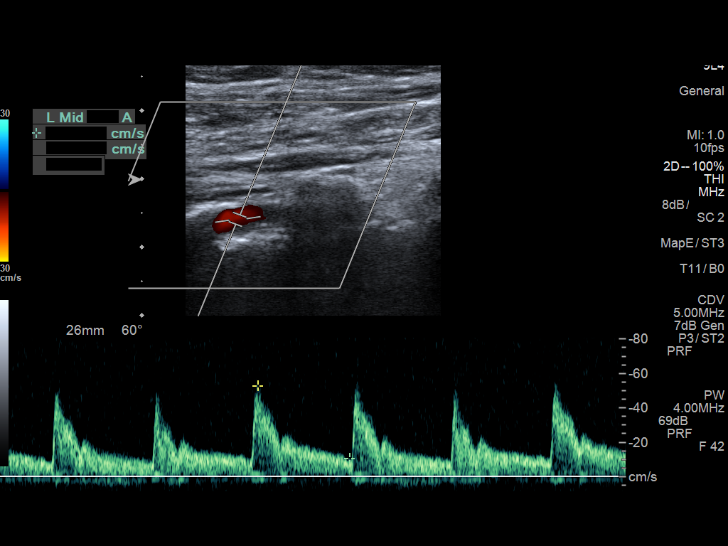

[13 of 24 positions shown; findings below may reference images not displayed]

FINDINGS: Criteria: Quantification of carotid stenosis is based on velocity
parameters that correlate the residual internal carotid diameter
with NASCET-based stenosis levels, using the diameter of the distal
internal carotid lumen as the denominator for stenosis measurement.

The following velocity measurements were obtained:

RIGHT

ICA:  105/28 cm/sec

CCA:  112/11 cm/sec

SYSTOLIC ICA/CCA RATIO:

DIASTOLIC ICA/CCA RATIO:

ECA:  94/8 cm/sec

LEFT

ICA:  119/26 cm/sec

CCA:  97/8 cm/sec

SYSTOLIC ICA/CCA RATIO:

DIASTOLIC ICA/CCA RATIO:

ECA:  115/10 cm/sec

RIGHT CAROTID ARTERY: Minor intimal thickening and scattered
echogenic shadowing plaque formation. No hemodynamically significant
right ICA stenosis, velocity elevation, or turbulent flow. Degree of
narrowing less than 50%.

RIGHT VERTEBRAL ARTERY:  Antegrade

LEFT CAROTID ARTERY: Similar scattered minor intimal thickening and
scattered echogenic plaque formation. No hemodynamically significant
left ICA stenosis, velocity elevation, or turbulent flow. Degree of
narrowing also less than 50%

LEFT VERTEBRAL ARTERY:  Antegrade
IMPRESSION: Minor carotid intimal thickening and atherosclerotic change. No
hemodynamically significant ICA stenosis. Degree of narrowing less
than 50% bilaterally.

Patent antegrade vertebral flow bilaterally

## 2017-12-30 ENCOUNTER — Encounter: Payer: Self-pay | Admitting: *Deleted

## 2017-12-30 DIAGNOSIS — Z006 Encounter for examination for normal comparison and control in clinical research program: Secondary | ICD-10-CM

## 2017-12-30 NOTE — Research (Signed)
Subject to research clinic for visit T8-M8 in the Clear research study.  No cos, aes or saes to report.  Subject did not return 1 empty bottle, however stated that she took all pills in the bottle.  Returned 1 bottle and based on the amount would be 93% compliant.  Next f/u phone call and clinic visit scheduled.  Subject re-consented to  [place] version 6.0 [id number] Local 25Jan2019  Subject met inclusion and exclusion criteria.  The informed consent form, study requirements and expectations were reviewed with the subject and questions and concerns were addressed prior to the signing of the consent form.  The subject verbalized understanding of the trial requirements.  The subject agreed to participate in the CLEAR trial and signed the informed consent.  The informed consent was obtained prior to performance of any protocol-specific procedures for the subject.  A copy of the signed informed consent was given to the subject and a copy was placed in the subject's medical record.

## 2018-02-02 ENCOUNTER — Encounter: Payer: Self-pay | Admitting: Cardiology

## 2018-02-24 ENCOUNTER — Ambulatory Visit: Payer: Medicare Other | Admitting: Cardiology

## 2018-03-04 ENCOUNTER — Encounter: Payer: Self-pay | Admitting: Cardiology

## 2018-03-04 ENCOUNTER — Encounter (INDEPENDENT_AMBULATORY_CARE_PROVIDER_SITE_OTHER): Payer: Self-pay

## 2018-03-04 ENCOUNTER — Ambulatory Visit: Payer: Medicare Other | Admitting: Cardiology

## 2018-03-04 VITALS — BP 132/76 | HR 48 | Ht 61.5 in | Wt 177.6 lb

## 2018-03-04 DIAGNOSIS — I6523 Occlusion and stenosis of bilateral carotid arteries: Secondary | ICD-10-CM | POA: Diagnosis not present

## 2018-03-04 DIAGNOSIS — R001 Bradycardia, unspecified: Secondary | ICD-10-CM

## 2018-03-04 DIAGNOSIS — I10 Essential (primary) hypertension: Secondary | ICD-10-CM

## 2018-03-04 DIAGNOSIS — Q230 Congenital stenosis of aortic valve: Secondary | ICD-10-CM | POA: Diagnosis not present

## 2018-03-04 DIAGNOSIS — E7800 Pure hypercholesterolemia, unspecified: Secondary | ICD-10-CM

## 2018-03-04 DIAGNOSIS — Q231 Congenital insufficiency of aortic valve: Secondary | ICD-10-CM

## 2018-03-04 LAB — BASIC METABOLIC PANEL
BUN/Creatinine Ratio: 21 (ref 12–28)
BUN: 23 mg/dL (ref 8–27)
CALCIUM: 9.4 mg/dL (ref 8.7–10.3)
CO2: 24 mmol/L (ref 20–29)
CREATININE: 1.09 mg/dL — AB (ref 0.57–1.00)
Chloride: 101 mmol/L (ref 96–106)
GFR calc Af Amer: 56 mL/min/{1.73_m2} — ABNORMAL LOW (ref >59)
GFR calc non Af Amer: 48 mL/min/{1.73_m2} — ABNORMAL LOW (ref >59)
Glucose: 101 mg/dL — ABNORMAL HIGH (ref 65–99)
POTASSIUM: 4.1 mmol/L (ref 3.5–5.2)
Sodium: 142 mmol/L (ref 134–144)

## 2018-03-04 MED ORDER — LISINOPRIL 10 MG PO TABS: 10.0000 mg | ORAL_TABLET | Freq: Every day | ORAL | 3 refills | Status: DC

## 2018-03-04 MED ORDER — HYDROCHLOROTHIAZIDE 12.5 MG PO CAPS: 12.5000 mg | ORAL_CAPSULE | Freq: Every day | ORAL | 3 refills | Status: DC

## 2018-03-04 NOTE — Patient Instructions (Signed)
Medication Instructions:  Your physician recommends that you continue on your current medications as directed. Please refer to the Current Medication list given to you today.  If you need a refill on your cardiac medications before your next appointment, please call your pharmacy.   Lab work: Today: BMET  If you have labs (blood work) drawn today and your tests are completely normal, you will receive your results only by: Marland Kitchen MyChart Message (if you have MyChart) OR . A paper copy in the mail If you have any lab test that is abnormal or we need to change your treatment, we will call you to review the results.  Follow-Up: At [facility], you and your health needs are our priority.  As part of our continuing mission to provide you with exceptional heart care, we have created designated Provider Care Teams.  These Care Teams include your primary Cardiologist (physician) and Advanced Practice Providers (APPs -  Physician Assistants and Nurse Practitioners) who all work together to provide you with the care you need, when you need it. You will need a follow up appointment in 1 years.  Please call our office 2 months in advance to schedule this appointment.  You may see Dr. Radford Pax or one of the following Advanced Practice Providers on your designated Care Team:   [place], PA-C Melina Copa, PA-C . Ermalinda Barrios, PA-C

## 2018-03-04 NOTE — Progress Notes (Signed)
Cardiology Office Note:    Date:  03/04/2018   ID:  [name], DOB 02/03/1941, MRN [id number]  PCP:  Josetta Huddle, MD  Cardiologist:  No primary care provider on file.    Referring MD: Josetta Huddle, MD   Chief Complaint  Patient presents with  . Aortic Stenosis  . Hypertension    History of Present Illness:    [name] is a 77 y.o. female with a hx of HTN and aortic stenosis s/p AVR with a bioprosthetic valve by Dr. Cyndia Bent.  She is here today for followup and is doing well.  She denies any chest pain or pressure, SOB, DOE, PND, orthopnea, LE edema, dizziness, palpitations or syncope. She is compliant with her meds and is tolerating meds with no SE.    Past Medical History:  Diagnosis Date  . Abnormal vaginal Pap smear 1994   annual paps for years after that.more recently every other year,last in 2012-we agreed not to do them anymore  . Anxiety    no rx  . Aortic stenosis    s/p AVR with bioprosthesis  . 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/2015  . Carotid artery stenosis    < 50% stenosis bilaterally by doppler 07/2016  . Coronary artery disease 2008   nonobstructive ASCAD with 30% LAD, normal coronary arteries 09/2013  . Hypercholesteremia   . Hypertension   . Obesity   . Osteopenia    declines treatment  . Pneumonia 1995  . Shoulder pain    Due to arthritis  . Vitamin D insufficiency     Past Surgical History:  Procedure Laterality Date  . AORTIC VALVE REPLACEMENT N/A 10/14/2013   Procedure: AORTIC VALVE REPLACEMENT (AVR);  Surgeon: Gaye Pollack, MD;  Location: [facility];  Service: Open Heart Surgery;  Laterality: N/A;  . CARDIAC CATHETERIZATION    . CHOLECYSTECTOMY  90  . INTRAOPERATIVE TRANSESOPHAGEAL ECHOCARDIOGRAM N/A 10/14/2013   Procedure: INTRAOPERATIVE TRANSESOPHAGEAL ECHOCARDIOGRAM;  Surgeon: Gaye Pollack, MD;  Location: [facility] OR;  Service: Open Heart Surgery;  Laterality: N/A;  . LEFT AND RIGHT HEART CATHETERIZATION WITH CORONARY ANGIOGRAM N/A 09/16/2013   Procedure:  LEFT AND RIGHT HEART CATHETERIZATION WITH CORONARY ANGIOGRAM;  Surgeon: Sueanne Margarita, MD;  Location: [facility] CATH LAB;  Service: Cardiovascular;  Laterality: N/A;    Current Medications: Current Meds  Medication Sig  . AMBULATORY NON FORMULARY MEDICATION Take 180 mg by mouth daily. Medication Name: bempedoic acid versus placebo CLEAR Research Study drug provided  . aspirin 81 MG tablet Take 81 mg by mouth every other day.   . Cholecalciferol (VITAMIN D) 2000 UNITS tablet Take 2,000 Units by mouth daily.   . hydrochlorothiazide (MICROZIDE) 12.5 MG capsule Take 1 capsule (12.5 mg total) by mouth daily.  Marland Kitchen lisinopril (PRINIVIL,ZESTRIL) 10 MG tablet Take 1 tablet (10 mg total) by mouth daily.     Allergies:   Crestor [rosuvastatin calcium]; Lipitor [atorvastatin]; Pravastatin; Simvastatin; Tramadol; Zetia [ezetimibe]; Codeine; Penicillins; and Sulfa antibiotics   Social History   Socioeconomic History  . Marital status: Married    Spouse name: Not on file  . Number of children: Not on file  . Years of education: Not on file  . Highest education level: Not on file  Occupational History  . Not on file  Social Needs  . Financial resource strain: Not on file  . Food insecurity:    Worry: Not on file    Inability: Not on file  . Transportation needs:    Medical: Not on  file    Non-medical: Not on file  Tobacco Use  . Smoking status: Never Smoker  . Smokeless tobacco: Never Used  Substance and Sexual Activity  . Alcohol use: No  . Drug use: No  . Sexual activity: Not on file  Lifestyle  . Physical activity:    Days per week: Not on file    Minutes per session: Not on file  . Stress: Not on file  Relationships  . Social connections:    Talks on phone: Not on file    Gets together: Not on file    Attends religious service: Not on file    Active member of club or organization: Not on file    Attends meetings of clubs or organizations: Not on file    Relationship status: Not on  file  Other Topics Concern  . Not on file  Social History Narrative  . Not on file     Family History: The patient's family history includes Arrhythmia in her sister; Dementia in her mother; Heart attack in her brother, father, and maternal grandfather; Heart disease in her brother and father.  ROS:   Please see the history of present illness.    ROS  All other systems reviewed and negative.   EKGs/Labs/Other Studies Reviewed:    The following studies were reviewed today: none  EKG:  EKG is  ordered today.  The ekg ordered today demonstrates sinus bradycardia at 48bpm with IRBBB  Recent Labs: No results found for requested labs within last 8760 hours.   Recent Lipid Panel    Component Value Date/Time   CHOL 168 01/28/2017 0957   TRIG 142 01/28/2017 0957   HDL 42 01/28/2017 0957   CHOLHDL 4.0 01/28/2017 0957   CHOLHDL 5.6 (H) 01/29/2016 0910   VLDL 31 (H) 01/29/2016 0910   LDLCALC 98 01/28/2017 0957    Physical Exam:    VS:  BP 132/76   Pulse (!) 48   Ht 5' 1.5" (1.562 m)   Wt 177 lb 9.6 oz (80.6 kg)   SpO2 96%   BMI 33.01 kg/m     Wt Readings from Last 3 Encounters:  03/04/18 177 lb 9.6 oz (80.6 kg)  12/23/17 179 lb 3.2 oz (81.3 kg)  06/25/17 176 lb (79.8 kg)     GEN:  Well nourished, well developed in no acute distress HEENT: Normal NECK: No JVD; No carotid bruits LYMPHATICS: No lymphadenopathy CARDIAC: RRR, no murmurs, rubs, gallops RESPIRATORY:  Clear to auscultation without rales, wheezing or rhonchi  ABDOMEN: Soft, non-tender, non-distended MUSCULOSKELETAL:  No edema; No deformity  SKIN: Warm and dry NEUROLOGIC:  Alert and oriented x 3 PSYCHIATRIC:  Normal affect   ASSESSMENT:    1. 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 Essential hypertension, benign   3. Bilateral carotid artery stenosis   4. Bradycardia   5. Pure hypercholesterolemia    PLAN:    In order of problems listed above:  1.  Severe aortic stenosis secondary to  bicuspid aortic valve -status post bioprosthetic aortic valve replacement.  Echo in 2015 showed stable AVR.  I will repeat a 2D echocardiogram next year which will be 5 years out.  She will continue on aspirin 81 mg daily.  2.  Hypertension - BP is well controlled on exam today.  She will continue on HCTZ 12.5mg  daily and Lisinopril 10mg  daily.  Her creatinine was 0.99 and K+ 4.1 in April 2019.  I will repeat BMET today.  3.  Bilateral carotid artery stenosis - < 50% bilateral ICA stenosis by dopplers a year ago. Continue ASA.   4.  Bradycardia - she is asymptomatic and on no rate slowing meds.   5.  Hyperlipidemia - LDL goal is < 70.  Her LDL was 79 in April 2019. She is in the Clear trial.  Medication Adjustments/Labs and Tests Ordered: Current medicines are reviewed at length with the patient today.  Concerns regarding medicines are outlined above.  No orders of the defined types were placed in this encounter.  No orders of the defined types were placed in this encounter.   Signed, Fransico Him, MD  03/04/2018 8:18 AM    [place]

## 2018-04-10 ENCOUNTER — Ambulatory Visit
Admission: RE | Admit: 2018-04-10 | Discharge: 2018-04-10 | Disposition: A | Discharge status: Home / Self Care | Payer: Medicare Other | Source: Ambulatory Visit | Attending: Internal Medicine | Admitting: Internal Medicine

## 2018-04-10 ENCOUNTER — Other Ambulatory Visit: Payer: Self-pay | Admitting: Internal Medicine

## 2018-04-10 DIAGNOSIS — R059 Cough, unspecified: Secondary | ICD-10-CM

## 2018-04-10 DIAGNOSIS — R05 Cough: Secondary | ICD-10-CM

## 2018-05-20 ENCOUNTER — Other Ambulatory Visit: Payer: Self-pay | Admitting: Internal Medicine

## 2018-05-20 DIAGNOSIS — Z1231 Encounter for screening mammogram for malignant neoplasm of breast: Secondary | ICD-10-CM

## 2018-06-22 ENCOUNTER — Other Ambulatory Visit: Payer: Self-pay

## 2018-06-22 ENCOUNTER — Encounter: Payer: Medicare Other | Admitting: *Deleted

## 2018-06-22 VITALS — BP 132/96 | HR 62 | Wt 181.4 lb

## 2018-06-22 DIAGNOSIS — Z006 Encounter for examination for normal comparison and control in clinical research program: Secondary | ICD-10-CM

## 2018-06-23 NOTE — Research (Signed)
Subject to research clinic for Visit T10-M24 in the Clear research trial.  No cos, aes or saes to report.  93% compliant with meds and new drug dispensed.  Next follow up phone call and next clinic visit scheduled.

## 2018-06-29 ENCOUNTER — Ambulatory Visit: Payer: Medicare Other

## 2018-09-18 ENCOUNTER — Telehealth: Payer: Self-pay | Admitting: *Deleted

## 2018-09-18 NOTE — Telephone Encounter (Signed)
Called patient for visit [phone] in the Clear research study.  LMOM for reason of call, chart and medical record check performed.  Next face to face visit has already been scheduled.

## 2018-12-21 ENCOUNTER — Encounter: Payer: Medicare Other | Admitting: *Deleted

## 2018-12-21 ENCOUNTER — Other Ambulatory Visit: Payer: Self-pay

## 2018-12-21 VITALS — BP 120/79 | HR 66 | Wt 178.6 lb

## 2018-12-21 DIAGNOSIS — Z006 Encounter for examination for normal comparison and control in clinical research program: Secondary | ICD-10-CM

## 2018-12-21 NOTE — Research (Signed)
Patient to clinic for visit T12M30in the Clear Research study. No medication changes,  cos, aes or saes to report.Next phone call and clinic visit scheduled.Re-consented to [place] version 7.0 1Mar2020, local 15Apr2020.  ClearInformed Consent   Subject Name:[name]  Subject met inclusion and exclusion criteria. The informed consent form, study requirements and expectations were reviewed with the subject and questions and concerns were addressed prior to the signing of the consent form. The subject verbalized understanding of the trial requirements. The subject agreed to participate in the Cleartrial and signed the informed consent at 1015 on 12/21/18. The informed consent was obtained prior to performance of any protocol-specific procedures for the subject. A copy of the signed informed consent was given to the subject and a copy was placed in the subject's medical record.   [place]

## 2019-03-08 ENCOUNTER — Other Ambulatory Visit: Payer: Self-pay | Admitting: Cardiology

## 2019-03-09 ENCOUNTER — Telehealth: Payer: Self-pay

## 2019-03-09 NOTE — Telephone Encounter (Signed)

## 2019-03-10 ENCOUNTER — Encounter: Payer: Self-pay | Admitting: Cardiology

## 2019-03-10 ENCOUNTER — Other Ambulatory Visit: Payer: Self-pay

## 2019-03-10 ENCOUNTER — Telehealth (INDEPENDENT_AMBULATORY_CARE_PROVIDER_SITE_OTHER): Payer: Medicare Other | Admitting: Cardiology

## 2019-03-10 VITALS — Ht 61.5 in | Wt 178.0 lb

## 2019-03-10 DIAGNOSIS — I2510 Atherosclerotic heart disease of native coronary artery without angina pectoris: Secondary | ICD-10-CM

## 2019-03-10 DIAGNOSIS — I10 Essential (primary) hypertension: Secondary | ICD-10-CM | POA: Diagnosis not present

## 2019-03-10 DIAGNOSIS — I6523 Occlusion and stenosis of bilateral carotid arteries: Secondary | ICD-10-CM

## 2019-03-10 DIAGNOSIS — Q231 Congenital insufficiency of aortic valve: Secondary | ICD-10-CM

## 2019-03-10 DIAGNOSIS — Q230 Congenital stenosis of aortic valve: Secondary | ICD-10-CM

## 2019-03-10 DIAGNOSIS — E7800 Pure hypercholesterolemia, unspecified: Secondary | ICD-10-CM

## 2019-03-10 DIAGNOSIS — R001 Bradycardia, unspecified: Secondary | ICD-10-CM

## 2019-03-10 NOTE — Addendum Note (Signed)
Addended by: Antonieta Iba on: 03/10/2019 12:00 PM   Modules accepted: Orders

## 2019-03-10 NOTE — Progress Notes (Signed)
Virtual Visit via Video Note   This visit type was conducted due to national recommendations for restrictions regarding the COVID-19 Pandemic (e.g. social distancing) in an effort to limit this patient's exposure and mitigate transmission in our community.  Due to her co-morbid illnesses, this patient is at least at moderate risk for complications without adequate follow up.  This format is felt to be most appropriate for this patient at this time.  All issues noted in this document were discussed and addressed.  A limited physical exam was performed with this format.  Please refer to the patient's chart for her consent to telehealth for [facility].   Evaluation Performed:  Follow-up visit  This visit type was conducted due to national recommendations for restrictions regarding the COVID-19 Pandemic (e.g. social distancing).  This format is felt to be most appropriate for this patient at this time.  All issues noted in this document were discussed and addressed.  No physical exam was performed (except for noted visual exam findings with Video Visits).  Please refer to the patient's chart (MyChart message for video visits and phone note for telephone visits) for the patient's consent to telehealth for [facility].  Date:  03/10/2019   ID:  [name], DOB April 28, 1940, MRN [id number]  Patient Location:  Home  Provider location:   [place]  PCP:  Josetta Huddle, MD  Cardiologist:  Fransico Him, MD Electrophysiologist:  None   Chief Complaint:  Aortic stenosis, HTN  History of Present Illness:    [name] is a 78 y.o. female who presents via audio/video conferencing for a telehealth visit today.    AMI [name] is a 78 y.o. female with a hx of HTN and aortic stenosis s/p AVR with a bioprosthetic valve by Dr. Cyndia Bent.  She is here today for followup and is doing well.  She denies any chest pain or pressure, SOB, DOE, PND, orthopnea, LE edema, dizziness, palpitations or syncope.  She is compliant with her meds and is tolerating meds with no SE.    The patient does not have symptoms concerning for COVID-19 infection (fever, chills, cough, or new shortness of breath).    Prior CV studies:   The following studies were reviewed today:  2D echo  Past Medical History:  Diagnosis Date  . Abnormal vaginal Pap smear 1994   annual paps for years after that.more recently every other year,last in 2012-we agreed not to do them anymore  . Anxiety    no rx  . Aortic stenosis    s/p AVR with bioprosthesis  . 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/2015  . Carotid artery stenosis    < 50% stenosis bilaterally by doppler 07/2016  . Coronary artery disease 2008   nonobstructive ASCAD with 30% LAD, normal coronary arteries 09/2013  . Hypercholesteremia   . Hypertension   . Obesity   . Osteopenia    declines treatment  . Pneumonia 1995  . Shoulder pain    Due to arthritis  . Vitamin D insufficiency    Past Surgical History:  Procedure Laterality Date  . AORTIC VALVE REPLACEMENT N/A 10/14/2013   Procedure: AORTIC VALVE REPLACEMENT (AVR);  Surgeon: Gaye Pollack, MD;  Location: [place];  Service: Open Heart Surgery;  Laterality: N/A;  . CARDIAC CATHETERIZATION    . CHOLECYSTECTOMY  90  . INTRAOPERATIVE TRANSESOPHAGEAL ECHOCARDIOGRAM N/A 10/14/2013   Procedure: INTRAOPERATIVE TRANSESOPHAGEAL ECHOCARDIOGRAM;  Surgeon: Gaye Pollack, MD;  Location: [facility] OR;  Service: Open Heart Surgery;  Laterality:  N/A;  . LEFT AND RIGHT HEART CATHETERIZATION WITH CORONARY ANGIOGRAM N/A 09/16/2013   Procedure: LEFT AND RIGHT HEART CATHETERIZATION WITH CORONARY ANGIOGRAM;  Surgeon: Sueanne Margarita, MD;  Location: [place] CATH LAB;  Service: Cardiovascular;  Laterality: N/A;     Current Meds  Medication Sig  . AMBULATORY NON FORMULARY MEDICATION Take 180 mg by mouth daily. Medication Name: bempedoic acid versus placebo CLEAR Research Study drug provided  . aspirin 81 MG tablet Take 81 mg by mouth every other day.   .  Cholecalciferol (VITAMIN D) 2000 UNITS tablet Take 2,000 Units by mouth daily.   . hydrochlorothiazide (MICROZIDE) 12.5 MG capsule Take 1 capsule (12.5 mg total) by mouth daily.  Marland Kitchen lisinopril (PRINIVIL,ZESTRIL) 10 MG tablet Take 1 tablet (10 mg total) by mouth daily.     Allergies:   Crestor [rosuvastatin calcium], Lipitor [atorvastatin], Pravastatin, Simvastatin, Tramadol, Zetia [ezetimibe], Codeine, Penicillins, and Sulfa antibiotics   Social History   Tobacco Use  . Smoking status: Never Smoker  . Smokeless tobacco: Never Used  Substance Use Topics  . Alcohol use: No  . Drug use: No     Family Hx: The patient's family history includes Arrhythmia in her sister; Dementia in her mother; Heart attack in her brother, father, and maternal grandfather; Heart disease in her brother and father.  ROS:   Please see the history of present illness.     All other systems reviewed and are negative.   Labs/Other Tests and Data Reviewed:    Recent Labs: No results found for requested labs within last 8760 hours.   Recent Lipid Panel Lab Results  Component Value Date/Time   CHOL 168 01/28/2017 09:57 AM   TRIG 142 01/28/2017 09:57 AM   HDL 42 01/28/2017 09:57 AM   CHOLHDL 4.0 01/28/2017 09:57 AM   CHOLHDL 5.6 (H) 01/29/2016 09:10 AM   LDLCALC 98 01/28/2017 09:57 AM    Wt Readings from Last 3 Encounters:  03/10/19 178 lb (80.7 kg)  12/21/18 178 lb 9.6 oz (81 kg)  06/22/18 181 lb 6.4 oz (82.3 kg)     Objective:    Vital Signs:  Ht 5' 1.5" (1.562 m)   Wt 178 lb (80.7 kg)   BMI 33.09 kg/m    CONSTITUTIONAL:  Well nourished, well developed female in no acute distress.  EYES: anicteric MOUTH: oral mucosa is pink RESPIRATORY: Normal respiratory effort, symmetric expansion CARDIOVASCULAR: No peripheral edema SKIN: No rash, lesions or ulcers MUSCULOSKELETAL: no digital cyanosis NEURO: Cranial Nerves II-XII grossly intact, moves all extremities PSYCH: Intact judgement and  insight.  A&O x 3, Mood/affect appropriate   ASSESSMENT & PLAN:    1.  Severe AS with bicuspid AV -s/p bioprosthetic AVR in 2015 -echo 5 years ago was stable -repeat 2D echo to make sure AVR looks stable -continue ASA  2.  HTN -Bp controlled  -continue HCTZ 12.5mg  daily, Lisinopril 10mg  daily -creatinine stable at 1.08 in August  3.  Bilateral carotid artery stenosis -carotid dopplers 07/2016 with minor carotid intimal thickening with < 50% narrowing -continue statin and ASA  4.  Bradycardia -asymptomatic -she does not check her HR at home but no weak spells  5.  HLD -LDL goal < 70 -LDL 126 in August -she is in the Clear trial  6.  Non Obstructive ASCAD -cath in 2015 with 30% LAD -denies CP -continue ASA  -she is in the CLEAR trial for lipid management  COVID-19 Education: The signs and symptoms of COVID-19 were discussed with  the patient and how to seek care for testing (follow up with PCP or arrange E-visit).  The importance of social distancing was discussed today.  Patient Risk:   After full review of this patient's clinical status, I feel that they are at least moderate risk at this time.  Time:   Today, I have spent 20 minutes directly with the patient on telemedicine discussing medical problems including CAD, AV disease, HLD.  We also reviewed the symptoms of COVID 19 and the ways to protect against contracting the virus with telehealth technology.  I spent an additional 5 minutes reviewing patient's chart including labs and 2D echo.  Medication Adjustments/Labs and Tests Ordered: Current medicines are reviewed at length with the patient today.  Concerns regarding medicines are outlined above.  Tests Ordered: No orders of the defined types were placed in this encounter.  Medication Changes: No orders of the defined types were placed in this encounter.   Disposition:  Follow up in 1 year(s)  Signed, Fransico Him, MD  03/10/2019 11:23 AM    [facility]

## 2019-03-10 NOTE — Patient Instructions (Addendum)
Medication Instructions:  Your physician recommends that you continue on your current medications as directed. Please refer to the Current Medication list given to you today.  *If you need a refill on your cardiac medications before your next appointment, please call your pharmacy*  Lab Work: None ordered.  Testing/Procedures: Your physician has requested that you have an echocardiogram on Thursday, December 17th, 2020 at 8:30AM. Please arrive at 8:15AM. Echocardiography is a painless test that uses sound waves to create images of your heart. It provides your doctor with information about the size and shape of your heart and how well your heart's chambers and valves are working. This procedure takes approximately one hour. There are no restrictions for this procedure.  Follow-Up: At [facility], you and your health needs are our priority.  As part of our continuing mission to provide you with exceptional heart care, we have created designated Provider Care Teams.  These Care Teams include your primary Cardiologist (physician) and Advanced Practice Providers (APPs -  Physician Assistants and Nurse Practitioners) who all work together to provide you with the care you need, when you need it.  Your next appointment:   1 year(s)  The format for your next appointment:   In Person  Provider:   Fransico Him, MD

## 2019-03-25 ENCOUNTER — Ambulatory Visit (HOSPITAL_COMMUNITY): Payer: Medicare Other | Attending: Cardiology

## 2019-03-25 ENCOUNTER — Other Ambulatory Visit: Payer: Self-pay

## 2019-03-25 DIAGNOSIS — Q230 Congenital stenosis of aortic valve: Secondary | ICD-10-CM | POA: Insufficient documentation

## 2019-03-25 DIAGNOSIS — Q231 Congenital insufficiency of aortic valve: Secondary | ICD-10-CM | POA: Diagnosis not present

## 2019-03-26 ENCOUNTER — Encounter: Payer: Self-pay | Admitting: Cardiology

## 2019-03-26 DIAGNOSIS — I77810 Thoracic aortic ectasia: Secondary | ICD-10-CM | POA: Insufficient documentation

## 2019-04-29 ENCOUNTER — Telehealth: Payer: Self-pay | Admitting: *Deleted

## 2019-04-29 DIAGNOSIS — Z006 Encounter for examination for normal comparison and control in clinical research program: Secondary | ICD-10-CM

## 2019-04-29 NOTE — Telephone Encounter (Signed)
Spoke with patient re: visit T13-M33 in the clear research study.  No aes or saes to report. Next clinic visit confirmed.

## 2019-06-21 ENCOUNTER — Encounter: Payer: Medicare PPO | Admitting: *Deleted

## 2019-06-21 ENCOUNTER — Other Ambulatory Visit: Payer: Self-pay

## 2019-06-21 VITALS — BP 110/72 | HR 72 | Wt 179.2 lb

## 2019-06-21 DIAGNOSIS — Z006 Encounter for examination for normal comparison and control in clinical research program: Secondary | ICD-10-CM

## 2019-06-21 NOTE — Research (Signed)
Patient to research clinic for visit [phone] in the Clear research study.  No aes or saes to report.  Next phone call and clinic visit scheduled.

## 2019-09-29 ENCOUNTER — Telehealth: Payer: Self-pay | Admitting: *Deleted

## 2019-09-29 NOTE — Telephone Encounter (Signed)
Completed phone call/medical record review for CLEAR patient for visit T15,M39. No AE's or SAE's to report. No changes in medication to report. Confirmed next in clinic appointment for September 13th, 2021.

## 2019-12-10 ENCOUNTER — Encounter: Payer: Medicare PPO | Admitting: *Deleted

## 2019-12-10 ENCOUNTER — Other Ambulatory Visit: Payer: Self-pay

## 2019-12-10 VITALS — BP 152/46 | HR 56 | Wt 180.8 lb

## 2019-12-10 DIAGNOSIS — Z006 Encounter for examination for normal comparison and control in clinical research program: Secondary | ICD-10-CM

## 2019-12-10 NOTE — Research (Signed)
Subject came into clinic today for T16, M42 visit for the CLEAR research study.  Subject was re-consented to [place] Version 8.1 [id number] Local [id number] on 12/10/2019 at 1012am.   Subject Name: [name]  Subject met inclusion and exclusion criteria.  The informed consent form, study requirements and expectations were reviewed with the subject and questions and concerns were addressed prior to the signing of the consent form.  The subject verbalized understanding of the trial requirements.  The subject agreed to participate in the CLEAR trial and signed the informed consent at 1012 on 12/10/19  The informed consent was obtained prior to performance of any protocol-specific procedures for the subject.  A copy of the signed informed consent was given to the subject and a copy was placed in the subject's medical record.   Preston Fleeting C   Subject's concomitant medications were reviewed and updated. No AE's/SAE's to report to sponsor at this time. Old medication bottles were returned and new IP was administered. Next appointment scheduled.

## 2020-04-03 ENCOUNTER — Other Ambulatory Visit: Payer: Self-pay | Admitting: Cardiology

## 2020-04-04 ENCOUNTER — Telehealth: Payer: Self-pay | Admitting: *Deleted

## 2020-04-04 NOTE — Telephone Encounter (Signed)
Completed phone call/medical record check for CLEAR visit T17, M45. Concomitant medications have been reviewed and updated if applicable. There are no new AE's or SAE's to report to sponsor at this time. Subject is still currently on study IP. Next appointment scheduled for March 4th, 2022 @ 0900.

## 2020-04-07 DIAGNOSIS — Z20822 Contact with and (suspected) exposure to covid-19: Secondary | ICD-10-CM | POA: Diagnosis not present

## 2020-04-18 DIAGNOSIS — H353131 Nonexudative age-related macular degeneration, bilateral, early dry stage: Secondary | ICD-10-CM | POA: Diagnosis not present

## 2020-04-18 DIAGNOSIS — H26493 Other secondary cataract, bilateral: Secondary | ICD-10-CM | POA: Diagnosis not present

## 2020-04-18 DIAGNOSIS — H35033 Hypertensive retinopathy, bilateral: Secondary | ICD-10-CM | POA: Diagnosis not present

## 2020-04-18 DIAGNOSIS — Z961 Presence of intraocular lens: Secondary | ICD-10-CM | POA: Diagnosis not present

## 2020-05-15 ENCOUNTER — Ambulatory Visit
Admission: RE | Admit: 2020-05-15 | Discharge: 2020-05-15 | Disposition: A | Discharge status: Home / Self Care | Payer: Medicare PPO | Source: Ambulatory Visit | Attending: Internal Medicine | Admitting: Internal Medicine

## 2020-05-15 ENCOUNTER — Other Ambulatory Visit: Payer: Self-pay | Admitting: Internal Medicine

## 2020-05-15 DIAGNOSIS — W19XXXA Unspecified fall, initial encounter: Secondary | ICD-10-CM

## 2020-05-15 DIAGNOSIS — R519 Headache, unspecified: Secondary | ICD-10-CM | POA: Diagnosis not present

## 2020-05-15 DIAGNOSIS — S0990XA Unspecified injury of head, initial encounter: Secondary | ICD-10-CM | POA: Diagnosis not present

## 2020-05-15 DIAGNOSIS — I10 Essential (primary) hypertension: Secondary | ICD-10-CM | POA: Diagnosis not present

## 2020-06-09 ENCOUNTER — Encounter: Payer: Medicare PPO | Admitting: *Deleted

## 2020-06-09 ENCOUNTER — Other Ambulatory Visit: Payer: Self-pay

## 2020-06-09 VITALS — BP 145/80 | HR 52 | Wt 180.0 lb

## 2020-06-09 DIAGNOSIS — Z006 Encounter for examination for normal comparison and control in clinical research program: Secondary | ICD-10-CM

## 2020-06-09 NOTE — Research (Signed)
Subject came into lab today for CLEAR T18, [name] visit. All of her concomitant medications have been reviewed and updated if applicable. There are no new AE's or SAE's to report to sponsor at this time. Subject was 96% compliant with IP. New IP was dispensed and next appointment scheduled for Friday, September 2nd, 2022 @ 0900am.

## 2020-07-03 ENCOUNTER — Other Ambulatory Visit: Payer: Self-pay | Admitting: Cardiology

## 2020-07-11 ENCOUNTER — Telehealth: Payer: Self-pay | Admitting: Cardiology

## 2020-07-11 MED ORDER — LISINOPRIL 10 MG PO TABS: 10.0000 mg | ORAL_TABLET | Freq: Every day | ORAL | 0 refills | Status: DC

## 2020-07-11 MED ORDER — HYDROCHLOROTHIAZIDE 12.5 MG PO CAPS: 12.5000 mg | ORAL_CAPSULE | Freq: Every day | ORAL | 0 refills | Status: DC

## 2020-07-11 NOTE — Telephone Encounter (Signed)
*  STAT* If patient is at the pharmacy, call can be transferred to refill team.   1. Which medications need to be refilled? (please list name of each medication and dose if known) hydrochlorothiazide (MICROZIDE) 12.5 MG capsule lisinopril (ZESTRIL) 10 MG tablet  2. Which pharmacy/location (including street and city if local pharmacy) is medication to be sent to? [place], [place] - [address]  3. Do they need a 30 day or 90 day supply? 90 day   Patient is scheduled 08/18/2020.

## 2020-07-11 NOTE — Telephone Encounter (Signed)
Pt scheduled 08/18/20 with Dr. Radford Pax.  Ok'd #45 tablets of HCTZ & Lisinopril to [facility].

## 2020-07-25 ENCOUNTER — Telehealth: Payer: Self-pay | Admitting: Cardiology

## 2020-07-25 NOTE — Telephone Encounter (Signed)
*  STAT* If patient is at the pharmacy, call can be transferred to refill team.   1. Which medications need to be refilled? (please list name of each medication and dose if known)  lisinopril (ZESTRIL) 10 MG tablet hydrochlorothiazide (MICROZIDE) 12.5 MG capsule  2. Which pharmacy/location (including street and city if local pharmacy) is medication to be sent to? [place], [place] - [address]  3. Do they need a 30 day or 90 day supply? 90 day   Patient has an appointment 08/18/2020, but will run out before her appointment. She states she has 2 left of the lisinopril and 6 left of the hydrochlorothiazide.

## 2020-07-26 NOTE — Telephone Encounter (Signed)
Pt's medications were already sent to pt's pharmacy as requested. Confirmation received.

## 2020-08-18 ENCOUNTER — Other Ambulatory Visit: Payer: Self-pay

## 2020-08-18 ENCOUNTER — Encounter: Payer: Self-pay | Admitting: Cardiology

## 2020-08-18 ENCOUNTER — Ambulatory Visit (INDEPENDENT_AMBULATORY_CARE_PROVIDER_SITE_OTHER): Payer: Medicare PPO | Admitting: Cardiology

## 2020-08-18 VITALS — BP 144/82 | HR 46 | Ht 61.5 in | Wt 179.0 lb

## 2020-08-18 DIAGNOSIS — I77810 Thoracic aortic ectasia: Secondary | ICD-10-CM

## 2020-08-18 DIAGNOSIS — I10 Essential (primary) hypertension: Secondary | ICD-10-CM | POA: Diagnosis not present

## 2020-08-18 DIAGNOSIS — E7800 Pure hypercholesterolemia, unspecified: Secondary | ICD-10-CM

## 2020-08-18 DIAGNOSIS — Q231 Congenital insufficiency of aortic valve: Secondary | ICD-10-CM | POA: Diagnosis not present

## 2020-08-18 DIAGNOSIS — I6523 Occlusion and stenosis of bilateral carotid arteries: Secondary | ICD-10-CM | POA: Diagnosis not present

## 2020-08-18 DIAGNOSIS — Q230 Congenital stenosis of aortic valve: Secondary | ICD-10-CM | POA: Diagnosis not present

## 2020-08-18 DIAGNOSIS — R001 Bradycardia, unspecified: Secondary | ICD-10-CM | POA: Diagnosis not present

## 2020-08-18 DIAGNOSIS — I2510 Atherosclerotic heart disease of native coronary artery without angina pectoris: Secondary | ICD-10-CM | POA: Diagnosis not present

## 2020-08-18 MED ORDER — LISINOPRIL 10 MG PO TABS: 10.0000 mg | ORAL_TABLET | Freq: Every day | ORAL | 3 refills | Status: DC

## 2020-08-18 MED ORDER — HYDROCHLOROTHIAZIDE 12.5 MG PO CAPS: 12.5000 mg | ORAL_CAPSULE | Freq: Every day | ORAL | 3 refills | Status: DC

## 2020-08-18 NOTE — Progress Notes (Signed)
Date:  08/18/2020   ID:  [name], DOB 1940/06/17, MRN [id number]   PCP:  Josetta Huddle, MD  Cardiologist:  Fransico Him, MD Electrophysiologist:  None   Chief Complaint:  Aortic stenosis, HTN  History of Present Illness:    [name] is a 80 y.o. female with a hx of HTN, mildly dilated ascending aorta, HLD, nonobstructive CAD with 30% LAD at cath in 2015 and aortic stenosis s/p AVR with a bioprosthetic valve by Dr. Cyndia Bent.  She is here today for followup and is doing well.  She denies any chest pain or pressure, SOB, DOE, PND, orthopnea, LE edema, dizziness, palpitations or syncope. She is compliant with her meds and is tolerating meds with no SE.    Prior CV studies:   The following studies were reviewed today:  2D echo, EKG  Past Medical History:  Diagnosis Date  . Abnormal vaginal Pap smear 1994   annual paps for years after that.more recently every other year,last in 2012-we agreed not to do them anymore  . Anxiety    no rx  . Aortic stenosis    s/p AVR with bioprosthesis  . Ascending aorta dilatation (HCC)    83mm by echo 03/2019  . 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/2015  . Carotid artery stenosis    < 50% stenosis bilaterally by doppler 07/2016  . Coronary artery disease 2008   nonobstructive ASCAD with 30% LAD, normal coronary arteries 09/2013  . Hypercholesteremia   . Hypertension   . Obesity   . Osteopenia    declines treatment  . Pneumonia 1995  . Shoulder pain    Due to arthritis  . Vitamin D insufficiency    Past Surgical History:  Procedure Laterality Date  . AORTIC VALVE REPLACEMENT N/A 10/14/2013   Procedure: AORTIC VALVE REPLACEMENT (AVR);  Surgeon: Gaye Pollack, MD;  Location: [place];  Service: Open Heart Surgery;  Laterality: N/A;  . CARDIAC CATHETERIZATION    . CHOLECYSTECTOMY  90  . INTRAOPERATIVE TRANSESOPHAGEAL ECHOCARDIOGRAM N/A 10/14/2013   Procedure: INTRAOPERATIVE TRANSESOPHAGEAL ECHOCARDIOGRAM;  Surgeon: Gaye Pollack, MD;  Location: [facility] OR;   Service: Open Heart Surgery;  Laterality: N/A;  . LEFT AND RIGHT HEART CATHETERIZATION WITH CORONARY ANGIOGRAM N/A 09/16/2013   Procedure: LEFT AND RIGHT HEART CATHETERIZATION WITH CORONARY ANGIOGRAM;  Surgeon: Sueanne Margarita, MD;  Location: [place] CATH LAB;  Service: Cardiovascular;  Laterality: N/A;     Current Meds  Medication Sig  . AMBULATORY NON FORMULARY MEDICATION Take 180 mg by mouth daily. Medication Name: bempedoic acid versus placebo CLEAR Research Study drug provided  . aspirin 81 MG tablet Take 81 mg by mouth every other day.   . Cholecalciferol (VITAMIN D) 2000 UNITS tablet Take 2,000 Units by mouth daily.   . hydrochlorothiazide (MICROZIDE) 12.5 MG capsule Take 1 capsule (12.5 mg total) by mouth daily. Please make overdue appt with Dr. Radford Pax before anymore refills. Thank you 2nd attempt  . lisinopril (ZESTRIL) 10 MG tablet Take 1 tablet (10 mg total) by mouth daily. Please make overdue appt with Dr. Radford Pax before anymore refills. Thank you 2nd attempt     Allergies:   Crestor [rosuvastatin calcium], Lipitor [atorvastatin], Pravastatin, Simvastatin, Tramadol, Zetia [ezetimibe], Codeine, Penicillins, and Sulfa antibiotics   Social History   Tobacco Use  . Smoking status: Never Smoker  . Smokeless tobacco: Never Used  Vaping Use  . Vaping Use: Never used  Substance Use Topics  . Alcohol use: No  . Drug use: No  Family Hx: The patient's family history includes Arrhythmia in her sister; Dementia in her mother; Heart attack in her brother, father, and maternal grandfather; Heart disease in her brother and father.  ROS:   Please see the history of present illness.     All other systems reviewed and are negative.   Labs/Other Tests and Data Reviewed:    Recent Labs: No results found for requested labs within last 8760 hours.   Recent Lipid Panel Lab Results  Component Value Date/Time   CHOL 168 01/28/2017 09:57 AM   TRIG 142 01/28/2017 09:57 AM   HDL 42  01/28/2017 09:57 AM   CHOLHDL 4.0 01/28/2017 09:57 AM   CHOLHDL 5.6 (H) 01/29/2016 09:10 AM   LDLCALC 98 01/28/2017 09:57 AM    Wt Readings from Last 3 Encounters:  08/18/20 179 lb (81.2 kg)  06/09/20 180 lb (81.6 kg)  12/10/19 180 lb 12.8 oz (82 kg)     Objective:    Vital Signs:  BP (!) 144/82   Pulse (!) 46   Ht 5' 1.5" (1.562 m)   Wt 179 lb (81.2 kg)   BMI 33.27 kg/m    tGEN: Well nourished, well developed in no acute distress HEENT: Normal NECK: No JVD; No carotid bruits LYMPHATICS: No lymphadenopathy CARDIAC:RRR, no murmurs, rubs, gallops RESPIRATORY:  Clear to auscultation without rales, wheezing or rhonchi  ABDOMEN: Soft, non-tender, non-distended MUSCULOSKELETAL:  No edema; No deformity  SKIN: Warm and dry NEUROLOGIC:  Alert and oriented x 3 PSYCHIATRIC:  Normal affect    ASSESSMENT & PLAN:    1.  Severe AS with bicuspid AV -s/p bioprosthetic AVR in 2015 -2D echo reviewed with her today from 2020 showing normal heart function with mild MR and stable AVR with mean AVG 66mmHg -continue ASA  2.  HTN -her BP is borderline controlled on exam today -she will continue on Lisinopril 10mg  daily and HCTZ 12.5mg  daily>>I will refill these for a year -labs ordered by PCP were reviewed today and showed SCR 1.17 and K+ 4 from Feb 2022 -repeat BMET in July  3.  Bilateral carotid artery stenosis -carotid dopplers 07/2016 with minor carotid intimal thickening with < 50% narrowing -repeat dopplers since is has been 4 years -continue statin and ASA  4.  Bradycardia -HR is in the upper 40's today and she remains asymptomatic  5.  HLD -LDL goal < 70 -LDL 113 in Aug 2021 -she is in the Clear trial but it finishes next week -repeat FLP and ALT in 2 weeks  6.  Non Obstructive ASCAD -cath in 2015 with 30% LAD -she has not had any anginal symptoms -continue ASA  -she is in the CLEAR trial for lipid management>>she finishes the study next Thursday  7.  Dilated  ascending aorta -measured at 36mm by echo 2020 -repeat echo to make sure this is stable   Medication Adjustments/Labs and Tests Ordered: Current medicines are reviewed at length with the patient today.  Concerns regarding medicines are outlined above.  Tests Ordered: Orders Placed This Encounter  Procedures  . EKG 12-Lead   Medication Changes: No orders of the defined types were placed in this encounter.   Disposition:  Follow up in 1 year(s)  Signed, Fransico Him, MD  08/18/2020 9:49 AM    [facility]

## 2020-08-18 NOTE — Patient Instructions (Signed)
Medication Instructions:  Your physician recommends that you continue on your current medications as directed. Please refer to the Current Medication list given to you today.  *If you need a refill on your cardiac medications before your next appointment, please call your pharmacy*  Lab Work: Fasting lipids in two weeks BMET in July 2022   Testing/Procedures: Your physician has requested that you have an echocardiogram. Echocardiography is a painless test that uses sound waves to create images of your heart. It provides your doctor with information about the size and shape of your heart and how well your heart's chambers and valves are working. This procedure takes approximately one hour. There are no restrictions for this procedure.  Your physician has requested that you have a carotid duplex. This test is an ultrasound of the carotid arteries in your neck. It looks at blood flow through these arteries that supply the brain with blood. Allow one hour for this exam. There are no restrictions or special instructions.  Follow-Up: At [facility], you and your health needs are our priority.  As part of our continuing mission to provide you with exceptional heart care, we have created designated Provider Care Teams.  These Care Teams include your primary Cardiologist (physician) and Advanced Practice Providers (APPs -  Physician Assistants and Nurse Practitioners) who all work together to provide you with the care you need, when you need it.  Your next appointment:   1 year(s)  The format for your next appointment:   In Person  Provider:   You may see Fransico Him, MD or one of the following Advanced Practice Providers on your designated Care Team:    Melina Copa, PA-C  Ermalinda Barrios, PA-C

## 2020-08-18 NOTE — Addendum Note (Signed)
Addended by: Antonieta Iba on: 08/18/2020 10:01 AM   Modules accepted: Orders

## 2020-08-23 DIAGNOSIS — R059 Cough, unspecified: Secondary | ICD-10-CM | POA: Diagnosis not present

## 2020-08-23 DIAGNOSIS — Z03818 Encounter for observation for suspected exposure to other biological agents ruled out: Secondary | ICD-10-CM | POA: Diagnosis not present

## 2020-08-28 ENCOUNTER — Ambulatory Visit (HOSPITAL_COMMUNITY)
Admission: RE | Admit: 2020-08-28 | Payer: Medicare PPO | Source: Ambulatory Visit | Attending: Cardiology | Admitting: Cardiology

## 2020-09-01 ENCOUNTER — Other Ambulatory Visit: Payer: Medicare PPO

## 2020-09-05 ENCOUNTER — Other Ambulatory Visit: Payer: Self-pay

## 2020-09-05 ENCOUNTER — Other Ambulatory Visit: Payer: Medicare PPO | Admitting: *Deleted

## 2020-09-05 ENCOUNTER — Ambulatory Visit (HOSPITAL_COMMUNITY)
Admission: RE | Admit: 2020-09-05 | Discharge: 2020-09-05 | Disposition: A | Discharge status: Home / Self Care | Payer: Medicare PPO | Source: Ambulatory Visit | Attending: Cardiology | Admitting: Cardiology

## 2020-09-05 DIAGNOSIS — Q230 Congenital stenosis of aortic valve: Secondary | ICD-10-CM | POA: Diagnosis not present

## 2020-09-05 DIAGNOSIS — E7800 Pure hypercholesterolemia, unspecified: Secondary | ICD-10-CM

## 2020-09-05 DIAGNOSIS — Q231 Congenital insufficiency of aortic valve: Secondary | ICD-10-CM | POA: Diagnosis not present

## 2020-09-05 DIAGNOSIS — I6523 Occlusion and stenosis of bilateral carotid arteries: Secondary | ICD-10-CM | POA: Insufficient documentation

## 2020-09-05 LAB — LIPID PANEL
Chol/HDL Ratio: 4.5 ratio — ABNORMAL HIGH (ref 0.0–4.4)
Cholesterol, Total: 195 mg/dL (ref 100–199)
HDL: 43 mg/dL (ref >39)
LDL Chol Calc (NIH): 126 mg/dL — ABNORMAL HIGH (ref 0–99)
Triglycerides: 143 mg/dL (ref 0–149)
VLDL Cholesterol Cal: 26 mg/dL (ref 5–40)

## 2020-09-06 ENCOUNTER — Encounter: Payer: Medicare PPO | Admitting: *Deleted

## 2020-09-06 VITALS — BP 145/59 | HR 59

## 2020-09-06 DIAGNOSIS — Z006 Encounter for examination for normal comparison and control in clinical research program: Secondary | ICD-10-CM

## 2020-09-06 NOTE — Research (Signed)
Subject came to the research clinic today for their End of Study visit in the CLEAR research trial. Subject was 91% compliant with their IP since last visit. All their concomitant medications have been reviewed and updated if applicable. There are no new AE's or SAE's to report to sponsor at this time. During the visit the subject had an EKG that was completed and the physical assessment was performed by the Sub-I of the study. Subject was reminded that they'll receive a post end of study phone call approximately 30 days from today.

## 2020-09-07 DIAGNOSIS — R059 Cough, unspecified: Secondary | ICD-10-CM | POA: Diagnosis not present

## 2020-09-08 ENCOUNTER — Telehealth: Payer: Self-pay

## 2020-09-08 DIAGNOSIS — E7800 Pure hypercholesterolemia, unspecified: Secondary | ICD-10-CM

## 2020-09-08 NOTE — Telephone Encounter (Signed)
The patient has been notified of the result and verbalized understanding.  All questions (if any) were answered. Antonieta Iba, RN 09/08/2020 11:31 AM  Patient scheduled with PharmD

## 2020-09-08 NOTE — Telephone Encounter (Signed)
-----   Message from Sueanne Margarita, MD sent at 09/07/2020  1:43 PM EDT ----- Percival Spanish please set her up for Pharm D visit ----- Message ----- From: Leeroy Bock, RPH-CPP Sent: 09/07/2020   7:35 AM EDT To: Sueanne Margarita, MD, Antonieta Iba, RN  She had her last study visit yesterday, ok to start other lipid lowering therapy now. I saw pt back in 2018 - she was intolerant to rosuvastatin 5mg  daily, simvastatin, pravastatin, atorvastatin, and ezetimibe at that time. Would see if pt is interested in PCSK9i therapy, and if so have her scheduled for visit with PharmD to discuss Repatha or Praluent injections.

## 2020-09-15 ENCOUNTER — Ambulatory Visit (HOSPITAL_COMMUNITY): Payer: Medicare PPO | Attending: Cardiology

## 2020-09-15 ENCOUNTER — Other Ambulatory Visit: Payer: Self-pay

## 2020-09-15 DIAGNOSIS — Q230 Congenital stenosis of aortic valve: Secondary | ICD-10-CM | POA: Insufficient documentation

## 2020-09-15 DIAGNOSIS — Q231 Congenital insufficiency of aortic valve: Secondary | ICD-10-CM | POA: Diagnosis not present

## 2020-09-15 LAB — ECHOCARDIOGRAM COMPLETE
AR max vel: 1.41 cm2
AV Area VTI: 1.53 cm2
AV Area mean vel: 1.5 cm2
AV Mean grad: 16 mmHg
AV Peak grad: 35.8 mmHg
Ao pk vel: 2.99 m/s
Area-P 1/2: 2.49 cm2
S' Lateral: 2.6 cm

## 2020-09-26 ENCOUNTER — Telehealth: Payer: Self-pay | Admitting: *Deleted

## 2020-09-26 DIAGNOSIS — Z006 Encounter for examination for normal comparison and control in clinical research program: Secondary | ICD-10-CM

## 2020-09-26 NOTE — Addendum Note (Signed)
Addended by: Sandie Ano D on: 09/26/2020 02:58 PM   Modules accepted: Orders

## 2020-09-26 NOTE — Telephone Encounter (Signed)
I called patient for 30-day post study phone call for Clear Study. Patient is doing well. I assessed for adverse events and concomitant medications. No new changes. I reminded patient to do heart healthy diet and regular exercise program. Patient has upcoming appointment with Dr. Radford Pax. I thanked patient for her participation in the study.

## 2020-09-27 ENCOUNTER — Ambulatory Visit (INDEPENDENT_AMBULATORY_CARE_PROVIDER_SITE_OTHER): Payer: Medicare PPO | Admitting: Pharmacist

## 2020-09-27 ENCOUNTER — Other Ambulatory Visit: Payer: Self-pay

## 2020-09-27 DIAGNOSIS — E7800 Pure hypercholesterolemia, unspecified: Secondary | ICD-10-CM | POA: Diagnosis not present

## 2020-09-27 DIAGNOSIS — I6523 Occlusion and stenosis of bilateral carotid arteries: Secondary | ICD-10-CM

## 2020-09-27 NOTE — Patient Instructions (Signed)
It was nice meeting you today.  We would like your LDL (bad cholesterol) to be less than 70  We would like to recommend a new medication called Repatha, which is an injection you would give yourself every 2 weeks  If you do decide to start it, please give me a call and I will complete the prior authorization for you  If you decide to start it, we will recheck your cholesterol in about 2-3 months  Please call me with any questions!  Karren Cobble, PharmD, BCACP, [place], [place] [address], [place], [place] [address] Phone: [phone]; Fax: [phone] 09/27/2020 2:48 PM

## 2020-09-27 NOTE — Progress Notes (Signed)
Patient ID: STEFFANI DIONISIO                 DOB: 1940/08/17                    MRN: [id number]     HPI: [name] is a 80 y.o. female patient referred to lipid clinic by Dr Fransico Him. PMH is significant for CAD, GERD, HLD, HTN, and s/p aortic valve replacement.  Patient has an extensive history of statin intolerance and hyperlipidemia.    Recently completed the CLEAR trial (bempedoic acid) and had updated labs.  LDL was still above goal and was referred to lipid clinic.  Patient presents today in good spirits. Has history of intolerance to rosuvastatin, atorvastatin, simvastatin, and pravastatin.  Also has intolerance listed to Zetia however patient does not remember taking it.    Patient unsure if she was given placebo or medication during trial but is under the impression it was placebo.  Current Medications: n/a Intolerances: rosuvastatin, atorvastatin, pravastatin, simvastatin, Zetia Risk Factors: CAD, age, HTN LDL goal: <70   Labs: TC 195, Trigs 143, HDL 43, LDL 126 (09/05/20 following CLEAR Trial)  Past Medical History:  Diagnosis Date   Abnormal vaginal Pap smear 1994   annual paps for years after that.more recently every other year,last in 2012-we agreed not to do them anymore   Anxiety    no rx   Aortic stenosis    s/p AVR with bioprosthesis   Ascending aorta dilatation ([place])    70mm by echo 03/2019   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/2015   Carotid artery stenosis    < 50% stenosis bilaterally by doppler 07/2016   Coronary artery disease 2008   nonobstructive ASCAD with 30% LAD, normal coronary arteries 09/2013   Hypercholesteremia    Hypertension    Obesity    Osteopenia    declines treatment   Pneumonia 1995   Shoulder pain    Due to arthritis   Vitamin D insufficiency     Current Outpatient Medications on File Prior to Visit  Medication Sig Dispense Refill   aspirin 81 MG tablet Take 81 mg by mouth every other day.      Cholecalciferol (VITAMIN D) 2000 UNITS tablet Take  2,000 Units by mouth daily.      hydrochlorothiazide (MICROZIDE) 12.5 MG capsule Take 1 capsule (12.5 mg total) by mouth daily. 90 capsule 3   lisinopril (ZESTRIL) 10 MG tablet Take 1 tablet (10 mg total) by mouth daily. 90 tablet 3   No current facility-administered medications on file prior to visit.    Allergies  Allergen Reactions   Crestor [Rosuvastatin Calcium] Other (See Comments)    Myalgias with 5mg  daily dose   Lipitor [Atorvastatin] Other (See Comments)    Stomach pain   Pravastatin Other (See Comments)    Myalgias    Simvastatin Other (See Comments)    Myalgias    Tramadol Other (See Comments)    FEELS LOOPY   Zetia [Ezetimibe]     Myalgias   Codeine Palpitations   Penicillins Palpitations   Sulfa Antibiotics Palpitations    Assessment/Plan:  1. Hyperlipidemia - Patient LDL after research study was 126 which is above goal of <70.  Recommended patient consider PCSK9i therapy to help bring patient to goal.  Discussed possible complications of CAD including MI and stroke.    Using Repatha demo pen, explained mechanism of action, storage, site selection, and administration.  Patient was able to demonstrate in  room.  Patient apprehensive due to cost (estimated at approximately 47 dollars a month).    Printed out patient education materials on Repatha and recommended she review and call back if she would like to pursue.  Will need prior authorization.  Karren Cobble, PharmD, BCACP, [place], [place] [address], [place], [place] [address] Phone: [phone]; Fax: [phone] 09/27/2020 3:59 PM

## 2020-10-06 ENCOUNTER — Other Ambulatory Visit: Payer: Medicare PPO | Admitting: *Deleted

## 2020-10-06 ENCOUNTER — Other Ambulatory Visit: Payer: Self-pay

## 2020-10-06 DIAGNOSIS — I10 Essential (primary) hypertension: Secondary | ICD-10-CM

## 2020-10-06 DIAGNOSIS — Q230 Congenital stenosis of aortic valve: Secondary | ICD-10-CM

## 2020-10-06 DIAGNOSIS — R001 Bradycardia, unspecified: Secondary | ICD-10-CM

## 2020-10-06 DIAGNOSIS — I6523 Occlusion and stenosis of bilateral carotid arteries: Secondary | ICD-10-CM | POA: Diagnosis not present

## 2020-10-06 DIAGNOSIS — Q231 Congenital insufficiency of aortic valve: Secondary | ICD-10-CM | POA: Diagnosis not present

## 2020-10-06 DIAGNOSIS — Q2381 Bicuspid aortic valve: Secondary | ICD-10-CM

## 2020-10-06 LAB — BASIC METABOLIC PANEL
BUN/Creatinine Ratio: 16 (ref 12–28)
BUN: 17 mg/dL (ref 8–27)
CO2: 25 mmol/L (ref 20–29)
Calcium: 9.2 mg/dL (ref 8.7–10.3)
Chloride: 103 mmol/L (ref 96–106)
Creatinine, Ser: 1.04 mg/dL — ABNORMAL HIGH (ref 0.57–1.00)
Glucose: 93 mg/dL (ref 65–99)
Potassium: 4 mmol/L (ref 3.5–5.2)
Sodium: 141 mmol/L (ref 134–144)
eGFR: 54 mL/min/{1.73_m2} — ABNORMAL LOW (ref >59)

## 2020-12-13 DIAGNOSIS — Z818 Family history of other mental and behavioral disorders: Secondary | ICD-10-CM | POA: Diagnosis not present

## 2020-12-13 DIAGNOSIS — I2510 Atherosclerotic heart disease of native coronary artery without angina pectoris: Secondary | ICD-10-CM | POA: Diagnosis not present

## 2020-12-13 DIAGNOSIS — I10 Essential (primary) hypertension: Secondary | ICD-10-CM | POA: Diagnosis not present

## 2020-12-13 DIAGNOSIS — Z8249 Family history of ischemic heart disease and other diseases of the circulatory system: Secondary | ICD-10-CM | POA: Diagnosis not present

## 2020-12-13 DIAGNOSIS — E669 Obesity, unspecified: Secondary | ICD-10-CM | POA: Diagnosis not present

## 2020-12-13 DIAGNOSIS — Z6834 Body mass index (BMI) 34.0-34.9, adult: Secondary | ICD-10-CM | POA: Diagnosis not present

## 2020-12-13 DIAGNOSIS — Z85828 Personal history of other malignant neoplasm of skin: Secondary | ICD-10-CM | POA: Diagnosis not present

## 2020-12-13 DIAGNOSIS — E785 Hyperlipidemia, unspecified: Secondary | ICD-10-CM | POA: Diagnosis not present

## 2021-01-31 DIAGNOSIS — Z0001 Encounter for general adult medical examination with abnormal findings: Secondary | ICD-10-CM | POA: Diagnosis not present

## 2021-01-31 DIAGNOSIS — Z23 Encounter for immunization: Secondary | ICD-10-CM | POA: Diagnosis not present

## 2021-01-31 DIAGNOSIS — E559 Vitamin D deficiency, unspecified: Secondary | ICD-10-CM | POA: Diagnosis not present

## 2021-01-31 DIAGNOSIS — I2510 Atherosclerotic heart disease of native coronary artery without angina pectoris: Secondary | ICD-10-CM | POA: Diagnosis not present

## 2021-01-31 DIAGNOSIS — E042 Nontoxic multinodular goiter: Secondary | ICD-10-CM | POA: Diagnosis not present

## 2021-01-31 DIAGNOSIS — R739 Hyperglycemia, unspecified: Secondary | ICD-10-CM | POA: Diagnosis not present

## 2021-01-31 DIAGNOSIS — E785 Hyperlipidemia, unspecified: Secondary | ICD-10-CM | POA: Diagnosis not present

## 2021-01-31 DIAGNOSIS — K219 Gastro-esophageal reflux disease without esophagitis: Secondary | ICD-10-CM | POA: Diagnosis not present

## 2021-01-31 DIAGNOSIS — I6523 Occlusion and stenosis of bilateral carotid arteries: Secondary | ICD-10-CM | POA: Diagnosis not present

## 2021-01-31 DIAGNOSIS — Z79899 Other long term (current) drug therapy: Secondary | ICD-10-CM | POA: Diagnosis not present

## 2021-01-31 DIAGNOSIS — I10 Essential (primary) hypertension: Secondary | ICD-10-CM | POA: Diagnosis not present

## 2021-01-31 DIAGNOSIS — M8580 Other specified disorders of bone density and structure, unspecified site: Secondary | ICD-10-CM | POA: Diagnosis not present

## 2021-04-13 DIAGNOSIS — Z6833 Body mass index (BMI) 33.0-33.9, adult: Secondary | ICD-10-CM | POA: Diagnosis not present

## 2021-04-13 DIAGNOSIS — E669 Obesity, unspecified: Secondary | ICD-10-CM | POA: Diagnosis not present

## 2021-04-13 DIAGNOSIS — I10 Essential (primary) hypertension: Secondary | ICD-10-CM | POA: Diagnosis not present

## 2021-04-13 DIAGNOSIS — Z8249 Family history of ischemic heart disease and other diseases of the circulatory system: Secondary | ICD-10-CM | POA: Diagnosis not present

## 2021-04-13 DIAGNOSIS — M1990 Unspecified osteoarthritis, unspecified site: Secondary | ICD-10-CM | POA: Diagnosis not present

## 2021-04-19 DIAGNOSIS — H353131 Nonexudative age-related macular degeneration, bilateral, early dry stage: Secondary | ICD-10-CM | POA: Diagnosis not present

## 2021-04-19 DIAGNOSIS — H26493 Other secondary cataract, bilateral: Secondary | ICD-10-CM | POA: Diagnosis not present

## 2021-04-19 DIAGNOSIS — Z961 Presence of intraocular lens: Secondary | ICD-10-CM | POA: Diagnosis not present

## 2021-04-19 DIAGNOSIS — H524 Presbyopia: Secondary | ICD-10-CM | POA: Diagnosis not present

## 2021-04-19 DIAGNOSIS — H35033 Hypertensive retinopathy, bilateral: Secondary | ICD-10-CM | POA: Diagnosis not present

## 2021-08-09 ENCOUNTER — Other Ambulatory Visit: Payer: Self-pay | Admitting: Cardiology

## 2021-09-12 ENCOUNTER — Other Ambulatory Visit: Payer: Self-pay | Admitting: Cardiology

## 2021-09-13 ENCOUNTER — Other Ambulatory Visit: Payer: Self-pay | Admitting: Cardiology

## 2021-09-18 ENCOUNTER — Ambulatory Visit: Payer: Medicare PPO | Admitting: Cardiology

## 2021-09-18 ENCOUNTER — Encounter: Payer: Self-pay | Admitting: Cardiology

## 2021-09-18 ENCOUNTER — Ambulatory Visit (INDEPENDENT_AMBULATORY_CARE_PROVIDER_SITE_OTHER): Payer: Medicare PPO

## 2021-09-18 VITALS — BP 156/78 | HR 52 | Ht 61.25 in | Wt 173.6 lb

## 2021-09-18 DIAGNOSIS — I6523 Occlusion and stenosis of bilateral carotid arteries: Secondary | ICD-10-CM

## 2021-09-18 DIAGNOSIS — I493 Ventricular premature depolarization: Secondary | ICD-10-CM

## 2021-09-18 DIAGNOSIS — I10 Essential (primary) hypertension: Secondary | ICD-10-CM

## 2021-09-18 DIAGNOSIS — Q230 Congenital stenosis of aortic valve: Secondary | ICD-10-CM | POA: Diagnosis not present

## 2021-09-18 DIAGNOSIS — E7800 Pure hypercholesterolemia, unspecified: Secondary | ICD-10-CM | POA: Diagnosis not present

## 2021-09-18 DIAGNOSIS — I2510 Atherosclerotic heart disease of native coronary artery without angina pectoris: Secondary | ICD-10-CM

## 2021-09-18 DIAGNOSIS — R001 Bradycardia, unspecified: Secondary | ICD-10-CM | POA: Diagnosis not present

## 2021-09-18 DIAGNOSIS — I77810 Thoracic aortic ectasia: Secondary | ICD-10-CM

## 2021-09-18 DIAGNOSIS — Q231 Congenital insufficiency of aortic valve: Secondary | ICD-10-CM | POA: Diagnosis not present

## 2021-09-18 LAB — LIPID PANEL
Chol/HDL Ratio: 5.3 ratio — ABNORMAL HIGH (ref 0.0–4.4)
Cholesterol, Total: 235 mg/dL — ABNORMAL HIGH (ref 100–199)
HDL: 44 mg/dL (ref >39)
LDL Chol Calc (NIH): 160 mg/dL — ABNORMAL HIGH (ref 0–99)
Triglycerides: 168 mg/dL — ABNORMAL HIGH (ref 0–149)
VLDL Cholesterol Cal: 31 mg/dL (ref 5–40)

## 2021-09-18 LAB — ALT: ALT: 23 IU/L (ref 0–32)

## 2021-09-18 MED ORDER — LISINOPRIL 10 MG PO TABS: 10.0000 mg | ORAL_TABLET | Freq: Every day | ORAL | 0 refills | Status: DC

## 2021-09-18 MED ORDER — HYDROCHLOROTHIAZIDE 12.5 MG PO CAPS: 12.5000 mg | ORAL_CAPSULE | Freq: Every day | ORAL | 0 refills | Status: DC

## 2021-09-18 NOTE — Addendum Note (Signed)
Addended by: Mendel Ryder on: 09/18/2021 09:01 AM   Modules accepted: Orders

## 2021-09-18 NOTE — Progress Notes (Signed)
Date:  09/18/2021   ID:  [name], DOB 1940/05/13, MRN [id number]   PCP:  Josetta Huddle, MD  Cardiologist:  Fransico Him, MD Electrophysiologist:  None   Chief Complaint:  Aortic stenosis, HTN  History of Present Illness:    [name] is a 81 y.o. female with a hx of HTN, mildly dilated ascending aorta, HLD, nonobstructive CAD with 30% LAD at cath in 2015 and aortic stenosis s/p  AVR with a bioprosthetic valve by Dr. Cyndia Bent.    She is here today for followup and is doing well.  She denies any chest pain or pressure, SOB, DOE, PND, orthopnea, dizziness, palpitations or syncope. Occasionally she will have some mild LE edemf she has been on her feet too long.  She is compliant with her meds and is tolerating meds with no SE.     Prior CV studies:   The following studies were reviewed today:  2D echo, EKG  Past Medical History:  Diagnosis Date   Abnormal vaginal Pap smear 1994   annual paps for years after that.more recently every other year,last in 2012-we agreed not to do them anymore   Anxiety    no rx   Aortic stenosis    s/p AVR with bioprosthesis   Ascending aorta dilatation ([place])    74mm by echo 03/2019   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/2015   Carotid artery stenosis    < 50% stenosis bilaterally by doppler 07/2016   Coronary artery disease 2008   nonobstructive ASCAD with 30% LAD, normal coronary arteries 09/2013   Hypercholesteremia    Hypertension    Obesity    Osteopenia    declines treatment   Pneumonia 1995   Shoulder pain    Due to arthritis   Vitamin D insufficiency    Past Surgical History:  Procedure Laterality Date   AORTIC VALVE REPLACEMENT N/A 10/14/2013   Procedure: AORTIC VALVE REPLACEMENT (AVR);  Surgeon: Gaye Pollack, MD;  Location: [facility];  Service: Open Heart Surgery;  Laterality: N/A;   CARDIAC CATHETERIZATION     CHOLECYSTECTOMY  90   INTRAOPERATIVE TRANSESOPHAGEAL ECHOCARDIOGRAM N/A 10/14/2013   Procedure: INTRAOPERATIVE TRANSESOPHAGEAL  ECHOCARDIOGRAM;  Surgeon: Gaye Pollack, MD;  Location: [facility];  Service: Open Heart Surgery;  Laterality: N/A;   LEFT AND RIGHT HEART CATHETERIZATION WITH CORONARY ANGIOGRAM N/A 09/16/2013   Procedure: LEFT AND RIGHT HEART CATHETERIZATION WITH CORONARY ANGIOGRAM;  Surgeon: Sueanne Margarita, MD;  Location: [facility];  Service: Cardiovascular;  Laterality: N/A;     Current Meds  Medication Sig   aspirin 81 MG tablet Take 81 mg by mouth every other day.    Cholecalciferol (VITAMIN D) 2000 UNITS tablet Take 2,000 Units by mouth daily.    [DISCONTINUED] hydrochlorothiazide (MICROZIDE) 12.5 MG capsule Take 1 capsule by mouth once daily   [DISCONTINUED] lisinopril (ZESTRIL) 10 MG tablet Take 1 tablet by mouth once daily     Allergies:   Crestor [rosuvastatin calcium], Lipitor [atorvastatin], Pravastatin, Simvastatin, Tramadol, Zetia [ezetimibe], Codeine, Penicillins, and Sulfa antibiotics   Social History   Tobacco Use   Smoking status: Never   Smokeless tobacco: Never  Vaping Use   Vaping Use: Never used  Substance Use Topics   Alcohol use: No   Drug use: No     Family Hx: The patient's family history includes Arrhythmia in her sister; Dementia in her mother; Heart attack in her brother, father, and maternal grandfather; Heart disease in her brother and father.  ROS:  Please see the history of present illness.     All other systems reviewed and are negative.   Labs/Other Tests and Data Reviewed:    Recent Labs: 10/06/2020: BUN 17; Creatinine, Ser 1.04; Potassium 4.0; Sodium 141   Recent Lipid Panel Lab Results  Component Value Date/Time   CHOL 195 09/05/2020 10:42 AM   TRIG 143 09/05/2020 10:42 AM   HDL 43 09/05/2020 10:42 AM   CHOLHDL 4.5 (H) 09/05/2020 10:42 AM   CHOLHDL 5.6 (H) 01/29/2016 09:10 AM   LDLCALC 126 (H) 09/05/2020 10:42 AM    Wt Readings from Last 3 Encounters:  09/18/21 173 lb 9.6 oz (78.7 kg)  08/18/20 179 lb (81.2 kg)  06/09/20 180 lb (81.6 kg)      Objective:    Vital Signs:  BP (!) 156/78   Pulse (!) 52   Ht 5' 1.25" (1.556 m)   Wt 173 lb 9.6 oz (78.7 kg)   SpO2 98%   BMI 32.53 kg/m    GEN: Well nourished, well developed in no acute distress HEENT: Normal NECK: No JVD; No carotid bruits LYMPHATICS: No lymphadenopathy CARDIAC:RRR, no murmurs, rubs, gallops RESPIRATORY:  Clear to auscultation without rales, wheezing or rhonchi  ABDOMEN: Soft, non-tender, non-distended MUSCULOSKELETAL:  No edema; No deformity  SKIN: Warm and dry NEUROLOGIC:  Alert and oriented x 3 PSYCHIATRIC:  Normal affect    EKG today demonstrates sinus bradycardia with trigeminal PVCs and RBBB ASSESSMENT & PLAN:    1.  Severe AS with bicuspid AV -s/p bioprosthetic AVR in 2015 -2D echo 2020 showing normal heart function with mild MR and stable AVR with mean AVG 93mmHg -continue ASA  2.  HTN -BP is borderline controlled on exam today but she has not taken her meds yet today -Continue prescription drug management with lisinopril 10 mg daily and HCTZ 12.5 mg daily with as needed refills -labs ordered by PCP were reviewed today and showed serum creatinine 1 on 01/31/2021  3.  Bilateral carotid artery stenosis -carotid dopplers 07/2016 with minor carotid intimal thickening with < 50% narrowing -repeat dopplers 2022 were normal -continue statin and ASA  4.  Bradycardia -HR is in the 50s today and she remains asymptomatic  5.  HLD -LDL goal < 70 -LDL 126 in May 2022 -repeat FLP and ALT  6.  Non Obstructive ASCAD -cath in 2015 with 30% LAD -She denies any anginal symptoms. -Continue aspirin 81 mg daily  7.  Dilated ascending aorta -measured at 38mm by echo 2020 and 41 mm by echo 2022 -repeat echo to make sure this is stable  8.  PVCs -fairly frequent on EKG -check 3 day Zio to assess PVC load   Medication Adjustments/Labs and Tests Ordered: Current medicines are reviewed at length with the patient today.  Concerns regarding medicines  are outlined above.  Tests Ordered: Orders Placed This Encounter  Procedures   EKG 12-Lead   Medication Changes: Meds ordered this encounter  Medications   lisinopril (ZESTRIL) 10 MG tablet    Sig: Take 1 tablet (10 mg total) by mouth daily.    Dispense:  30 tablet    Refill:  0    Please call our office to schedule an overdue appointment with Dr. Radford Pax before anymore refills. [phone]. Thank you 1st attempt   hydrochlorothiazide (MICROZIDE) 12.5 MG capsule    Sig: Take 1 capsule (12.5 mg total) by mouth daily.    Dispense:  30 capsule    Refill:  0  Pt must keep upcoming appt in June 2023 with Dr. Radford Pax before anymore refills. Thank you Final Attempt    Disposition:  Follow up in 1 year(s)  Signed, Fransico Him, MD  09/18/2021 8:55 AM    [facility]

## 2021-09-18 NOTE — Patient Instructions (Signed)
Medication Instructions:  Your physician recommends that you continue on your current medications as directed. Please refer to the Current Medication list given to you today.  *If you need a refill on your cardiac medications before your next appointment, please call your pharmacy*   Lab Work: Lipid, Alt- today   If you have labs (blood work) drawn today and your tests are completely normal, you will receive your results only by: [place] (if you have MyChart) OR A paper copy in the mail If you have any lab test that is abnormal or we need to change your treatment, we will call you to review the results.   Testing/Procedures: Your physician has requested that you have an echocardiogram. Echocardiography is a painless test that uses sound waves to create images of your heart. It provides your doctor with information about the size and shape of your heart and how well your heart's chambers and valves are working. This procedure takes approximately one hour. There are no restrictions for this procedure.  A zio monitor was ordered today. It will remain on for 3 days. You will then return monitor and event diary in provided box. It takes 1-2 weeks for report to be downloaded and returned to [place]. We will call you with the results. If monitor falls off or has orange flashing light, please call Zio for further instructions.     Follow-Up: At [facility], you and your health needs are our priority.  As part of our continuing mission to provide you with exceptional heart care, we have created designated Provider Care Teams.  These Care Teams include your primary Cardiologist (physician) and Advanced Practice Providers (APPs -  Physician Assistants and Nurse Practitioners) who all work together to provide you with the care you need, when you need it.  We recommend signing up for the patient portal called "MyChart".  Sign up information is provided on this After Visit Summary.  MyChart is used  to connect with patients for Virtual Visits (Telemedicine).  Patients are able to view lab/test results, encounter notes, upcoming appointments, etc.  Non-urgent messages can be sent to your provider as well.   To learn more about what you can do with MyChart, go to [email or web address].    Your next appointment:   12 month(s)  The format for your next appointment:   In Person  Provider:   Fransico Him, MD     Other Instructions ZIO XT- Long Term Monitor Instructions  Your physician has requested you wear a ZIO patch monitor for 3  days.  This is a single patch monitor. [employer] supplies one patch monitor per enrollment. Additional stickers are not available. Please do not apply patch if you will be having a Nuclear Stress Test,  Echocardiogram, Cardiac CT, MRI, or Chest Xray during the period you would be wearing the  monitor. The patch cannot be worn during these tests. You cannot remove and re-apply the  ZIO XT patch monitor.  Your ZIO patch monitor will be mailed 3 day USPS to your address on file. It may take 3-5 days  to receive your monitor after you have been enrolled.  Once you have received your monitor, please review the enclosed instructions. Your monitor  has already been registered assigning a specific monitor serial # to you.  Billing and Patient Assistance Program Information  We have supplied [employer] with any of your insurance information on file for billing purposes. [employer] offers a sliding scale Patient Assistance Program for patients that  do not have  insurance, or whose insurance does not completely cover the cost of the ZIO monitor.  You must apply for the Patient Assistance Program to qualify for this discounted rate.  To apply, please call [employer] at [phone], select option 4, select option 2, ask to apply for  Patient Assistance Program. Theodore Demark will ask your household income, and how many people  are in your household. They will quote your  out-of-pocket cost based on that information.  [employer] will also be able to set up a 70-month, interest-free payment plan if needed.  Applying the monitor   Shave hair from upper left chest.  Hold abrader disc by orange tab. Rub abrader in 40 strokes over the upper left chest as  indicated in your monitor instructions.  Clean area with 4 enclosed alcohol pads. Let dry.  Apply patch as indicated in monitor instructions. Patch will be placed under collarbone on left  side of chest with arrow pointing upward.  Rub patch adhesive wings for 2 minutes. Remove white label marked "1". Remove the white  label marked "2". Rub patch adhesive wings for 2 additional minutes.  While looking in a mirror, press and release button in center of patch. A small green light will  flash 3-4 times. This will be your only indicator that the monitor has been turned on.  Do not shower for the first 24 hours. You may shower after the first 24 hours.  Press the button if you feel a symptom. You will hear a small click. Record Date, Time and  Symptom in the Patient Logbook.  When you are ready to remove the patch, follow instructions on the last 2 pages of Patient  Logbook. Stick patch monitor onto the last page of Patient Logbook.  Place Patient Logbook in the blue and white box. Use locking tab on box and tape box closed  securely. The blue and white box has prepaid postage on it. Please place it in the mailbox as  soon as possible. Your physician should have your test results approximately 7 days after the  monitor has been mailed back to [facility].  Call [place] at [phone] if you have questions regarding  your ZIO XT patch monitor. Call them immediately if you see an orange light blinking on your  monitor.  If your monitor falls off in less than 4 days, contact our Monitor department at [phone].  If your monitor becomes loose or falls off after 4 days call [employer] at  [phone] for  suggestions on securing your monitor   Important Information About [place]

## 2021-09-18 NOTE — Progress Notes (Unsigned)
Enrolled patient for a 3 day Zio XT monitor to be mailed to patients home  

## 2021-09-19 ENCOUNTER — Telehealth: Payer: Self-pay

## 2021-09-19 DIAGNOSIS — E782 Mixed hyperlipidemia: Secondary | ICD-10-CM

## 2021-09-19 NOTE — Telephone Encounter (Signed)
Called and spoke with patient who understands that she will get a call to see lipid clinic. She states that she has been in a "study" with [facility] for the last 2 years and doesn't know if this could be contributing to  her high LDL.

## 2021-09-19 NOTE — Telephone Encounter (Signed)
-----   Message from Sueanne Margarita, MD sent at 09/18/2021  7:40 PM EDT ----- Lipids still not at goal.  Please forward to lipid clinic for further recommendations.

## 2021-09-20 DIAGNOSIS — I493 Ventricular premature depolarization: Secondary | ICD-10-CM | POA: Diagnosis not present

## 2021-10-04 ENCOUNTER — Telehealth: Payer: Self-pay

## 2021-10-04 DIAGNOSIS — I2510 Atherosclerotic heart disease of native coronary artery without angina pectoris: Secondary | ICD-10-CM

## 2021-10-04 DIAGNOSIS — I493 Ventricular premature depolarization: Secondary | ICD-10-CM

## 2021-10-04 DIAGNOSIS — I6523 Occlusion and stenosis of bilateral carotid arteries: Secondary | ICD-10-CM

## 2021-10-04 DIAGNOSIS — E782 Mixed hyperlipidemia: Secondary | ICD-10-CM

## 2021-10-04 NOTE — Telephone Encounter (Signed)
The patient has been notified of the result and verbalized understanding.  All questions (if any) were answered. Antonieta Iba, RN 10/04/2021 1:17 PM  Patient is already scheduled for an echocardiogram. Labs have been ordered and will be done the same day as the echo. Coronary CTA has been ordered.

## 2021-10-04 NOTE — Telephone Encounter (Signed)
-----   Message from Sueanne Margarita, MD sent at 10/01/2021  7:26 PM EDT ----- For moderate 3 episodes of SVT lasting up to 10 beats and one episode of nonsustained ventricular tachycardia.  There were occasional PVCs with PVC load 2.6%.  Please find out if the patient is symptomatic with her palpitations.  Please have her come in for TSH, BMET and magnesium level.  Get a 2D echocardiogram to assess LV function.  Please order a coronary CTA to rule out progression of CAD

## 2021-10-05 ENCOUNTER — Other Ambulatory Visit (HOSPITAL_COMMUNITY): Payer: Medicare PPO

## 2021-10-10 ENCOUNTER — Ambulatory Visit (HOSPITAL_COMMUNITY): Payer: Medicare PPO | Attending: Cardiology

## 2021-10-10 DIAGNOSIS — I340 Nonrheumatic mitral (valve) insufficiency: Secondary | ICD-10-CM | POA: Diagnosis not present

## 2021-10-10 DIAGNOSIS — E785 Hyperlipidemia, unspecified: Secondary | ICD-10-CM | POA: Insufficient documentation

## 2021-10-10 DIAGNOSIS — I77810 Thoracic aortic ectasia: Secondary | ICD-10-CM | POA: Insufficient documentation

## 2021-10-10 DIAGNOSIS — I2510 Atherosclerotic heart disease of native coronary artery without angina pectoris: Secondary | ICD-10-CM | POA: Diagnosis not present

## 2021-10-10 DIAGNOSIS — Z953 Presence of xenogenic heart valve: Secondary | ICD-10-CM | POA: Diagnosis not present

## 2021-10-10 DIAGNOSIS — I10 Essential (primary) hypertension: Secondary | ICD-10-CM | POA: Diagnosis not present

## 2021-10-10 LAB — ECHOCARDIOGRAM COMPLETE
AR max vel: 1.37 cm2
AV Area VTI: 1.44 cm2
AV Area mean vel: 1.36 cm2
AV Mean grad: 17 mmHg
AV Peak grad: 29.8 mmHg
Ao pk vel: 2.73 m/s
Area-P 1/2: 2.94 cm2
S' Lateral: 2.7 cm

## 2021-10-11 ENCOUNTER — Encounter: Payer: Self-pay | Admitting: Cardiology

## 2021-10-15 ENCOUNTER — Encounter: Payer: Self-pay | Admitting: Pharmacist

## 2021-10-15 ENCOUNTER — Ambulatory Visit (INDEPENDENT_AMBULATORY_CARE_PROVIDER_SITE_OTHER): Payer: Medicare PPO | Admitting: Pharmacist

## 2021-10-15 DIAGNOSIS — E7800 Pure hypercholesterolemia, unspecified: Secondary | ICD-10-CM

## 2021-10-15 MED ORDER — REPATHA SURECLICK 140 MG/ML ~~LOC~~ SOAJ: 1.0000 | SUBCUTANEOUS | 0 refills | Status: DC

## 2021-10-15 MED ORDER — REPATHA SURECLICK 140 MG/ML ~~LOC~~ SOAJ: 1.0000 | SUBCUTANEOUS | 3 refills | Status: DC

## 2021-10-15 NOTE — Progress Notes (Signed)
Patient ID: [name]                 DOB: [birth year]                    MRN: [id number]     HPI: [name] is a 81 y.o. female patient referred to lipid clinic by Dr Radford Pax. PMH is significant for HTN, HLD, nonobstructive CAD with 30% LAD at cath in 2015 and aortic stenosis s/p bioprosthetic AVR, bilateral carotid artery stenosis with < 50% narrowing on 07/2016 dopplers, repeat dopplers 2022 were normal, and mildly dilated ascending aorta. Pt intolerant to 4 statins and Zetia, previously enrolled in CLEAR trial investigating bempedoic acid which has since ended. Saw lipid clinic a year ago, PCSK9i were discussed but pt was apprehensive due to cost.  Pt presents today for follow up. Confirms prior allergy to multiple statins and ezetimibe. Tolerated Nexletol well in CLEAR trial which ended last year.  Current Medications: none Intolerances: pravastatin, simvastatin, rosuvastatin 5mg  daily, ezetimibe 10mg  daily - myalgias, atorvastatin - stomach pain Risk Factors: nonobstructive CAD, carotid stenosis LDL goal: 70mg /dL  Diet: Breakfast - Slim Fast. Veggies during the day. Doesn't eat much meat. Drinks mainly water or milk. Does like sweets.  Exercise: Walks most days a week for 30 minutes  Family History: Arrhythmia in her sister; Dementia in her mother; Heart attack in her brother, father, and maternal grandfather; Heart disease in her brother and father.  Social History: Denies tobacco, alcohol and drug use.  Labs: 09/18/21: TC 235, TG 168, HDL 44, LDL 160 (no LLT)  Past Medical History:  Diagnosis Date   Abnormal vaginal Pap smear 1994   annual paps for years after that.more recently every other year,last in 2012-we agreed not to do them anymore   Anxiety    no rx   Aortic stenosis    s/p AVR with bioprosthesis   Ascending aorta dilatation ([place])    18mm by echo 10/2021   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/2015   Carotid artery stenosis    < 50% stenosis bilaterally by doppler 07/2016    Coronary artery disease 2008   nonobstructive ASCAD with 30% LAD, normal coronary arteries 09/2013   Hypercholesteremia    Hypertension    Obesity    Osteopenia    declines treatment   Pneumonia 1995   Shoulder pain    Due to arthritis   Vitamin D insufficiency     Current Outpatient Medications on File Prior to Visit  Medication Sig Dispense Refill   aspirin 81 MG tablet Take 81 mg by mouth every other day.      Cholecalciferol (VITAMIN D) 2000 UNITS tablet Take 2,000 Units by mouth daily.      hydrochlorothiazide (MICROZIDE) 12.5 MG capsule Take 1 capsule (12.5 mg total) by mouth daily. 30 capsule 0   lisinopril (ZESTRIL) 10 MG tablet Take 1 tablet (10 mg total) by mouth daily. 30 tablet 0   No current facility-administered medications on file prior to visit.    Allergies  Allergen Reactions   Crestor [Rosuvastatin Calcium] Other (See Comments)    Myalgias with 5mg  daily dose   Lipitor [Atorvastatin] Other (See Comments)    Stomach pain   Pravastatin Other (See Comments)    Myalgias    Simvastatin Other (See Comments)    Myalgias    Tramadol Other (See Comments)    FEELS LOOPY   Zetia [Ezetimibe]     Myalgias   Codeine Palpitations  Penicillins Palpitations   Sulfa Antibiotics Palpitations    Assessment/Plan:  1. Hyperlipidemia - LDL 160 above goal < 70 due to CAD. Pt intolerant to 4 statins and Zetia. Discussed PCSK9i with pt, she is agreeable to start therapy. Prior auth submitted and approved for Repatha through 04/07/22. Copay $40/1 month or $80/3 months. Pt also given free 1 month copay card. Encouraged pt to decrease intake of sweets to help lower TG which were slightly elevated. Will recheck lipids and ALT in 3 months.  [name], PharmD, BCACP, [place] [address] [place], [place] [address] Phone: [phone]; Fax: [phone] 10/15/2021 1:39 PM

## 2021-10-15 NOTE — Patient Instructions (Addendum)
It was nice to see you today!  Your LDL cholesterol is 160 and your goal is < 70  Start Repatha, a subcutaneous injection given once every 2 weeks in the fatty tissue of your stomach or upper outer thigh. Store the medication in the fridge. You can let your dose warm up to room temperature for 30 minutes before injecting if you prefer. Repatha will lower your LDL cholesterol by 60% and helps to lower your chance of having a heart attack or stroke.  Bring in the copay card to make the first month free, after that, a 3 month fill will cost $80 with your insurance  Recheck fasting cholesterol on Monday, October 2nd any time after 7:30am

## 2021-11-13 ENCOUNTER — Other Ambulatory Visit: Payer: Self-pay | Admitting: Cardiology

## 2021-11-15 ENCOUNTER — Telehealth (HOSPITAL_COMMUNITY): Payer: Self-pay | Admitting: *Deleted

## 2021-11-15 ENCOUNTER — Telehealth (HOSPITAL_COMMUNITY): Payer: Self-pay | Admitting: Emergency Medicine

## 2021-11-15 NOTE — Telephone Encounter (Signed)
Attempted to call patient regarding upcoming cardiac CT appointment. °Left message on voicemail with name and callback number ° °Acea Yagi RN Navigator Cardiac Imaging °[facility] °[phone] Office °[phone] Cell ° °

## 2021-11-15 NOTE — Telephone Encounter (Signed)
Reaching out to patient to offer assistance regarding upcoming cardiac imaging study; pt verbalizes understanding of appt date/time, parking situation and where to check in, pre-test NPO status and medications ordered, and verified current allergies; name and call back number provided for further questions should they arise [name] Bond RN Navigator Cardiac Imaging [facility] [phone] office [phone] cell   Arrival 1230 w/c entrance Some iv difficulty in past Daily meds, holding HCTZ

## 2021-11-16 ENCOUNTER — Ambulatory Visit (HOSPITAL_COMMUNITY)
Admission: RE | Admit: 2021-11-16 | Discharge: 2021-11-16 | Disposition: A | Discharge status: Home / Self Care | Payer: Medicare PPO | Source: Ambulatory Visit | Attending: Cardiology | Admitting: Cardiology

## 2021-11-16 ENCOUNTER — Encounter: Payer: Self-pay | Admitting: Cardiology

## 2021-11-16 DIAGNOSIS — I6523 Occlusion and stenosis of bilateral carotid arteries: Secondary | ICD-10-CM | POA: Diagnosis not present

## 2021-11-16 DIAGNOSIS — E782 Mixed hyperlipidemia: Secondary | ICD-10-CM | POA: Diagnosis not present

## 2021-11-16 DIAGNOSIS — I493 Ventricular premature depolarization: Secondary | ICD-10-CM | POA: Diagnosis not present

## 2021-11-16 DIAGNOSIS — I2510 Atherosclerotic heart disease of native coronary artery without angina pectoris: Secondary | ICD-10-CM | POA: Insufficient documentation

## 2021-11-16 DIAGNOSIS — I272 Pulmonary hypertension, unspecified: Secondary | ICD-10-CM | POA: Insufficient documentation

## 2021-11-16 DIAGNOSIS — I700 Atherosclerosis of aorta: Secondary | ICD-10-CM

## 2021-11-16 MED ORDER — NITROGLYCERIN 0.4 MG SL SUBL
SUBLINGUAL_TABLET | SUBLINGUAL | Status: AC
  Filled 2021-11-16: qty 2

## 2021-11-16 MED ORDER — IOHEXOL 350 MG/ML SOLN
100.0000 mL | Freq: Once | INTRAVENOUS | Status: AC | PRN
  Administered 2021-11-16: 100 mL via INTRAVENOUS

## 2021-11-16 MED ORDER — NITROGLYCERIN 0.4 MG SL SUBL
0.8000 mg | SUBLINGUAL_TABLET | Freq: Once | SUBLINGUAL | Status: AC
  Administered 2021-11-16: 0.8 mg via SUBLINGUAL

## 2021-11-19 ENCOUNTER — Telehealth: Payer: Self-pay | Admitting: Cardiology

## 2021-11-19 ENCOUNTER — Telehealth: Payer: Self-pay | Admitting: *Deleted

## 2021-11-19 ENCOUNTER — Other Ambulatory Visit: Payer: Self-pay | Admitting: Cardiology

## 2021-11-19 DIAGNOSIS — J9859 Other diseases of mediastinum, not elsewhere classified: Secondary | ICD-10-CM

## 2021-11-19 DIAGNOSIS — I272 Pulmonary hypertension, unspecified: Secondary | ICD-10-CM

## 2021-11-19 NOTE — Telephone Encounter (Signed)
I s/w the pt and she would like to hold off a bit on the sleep study. Pt states her son is in [place] and was in ICU. The pt feels like she has a lot on her plate right now and needs to help care for her son and give her daughter-in-law a break from the hospital as well. Pt would like to just call us back when she is ready to set up sleep study. I assured the pt that I will let Dr. Radford Pax and her nurse know of our conversation today. Pt thanked me greatly for the help.

## 2021-11-19 NOTE — Telephone Encounter (Signed)
Sueanne Margarita, MD  11/16/2021  3:20 PM EDT     Coronary Ca score of 331 with minimal multivessel plaque < 25% stenosis.  Needs aggressive risk factor modification. 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  Please get a PET-CT ASAP with contrast

## 2021-11-19 NOTE — Telephone Encounter (Signed)
The patient has been notified of the result and verbalized understanding.  All questions (if any) were answered. Antonieta Iba, RN 11/19/2021 1:16 PM  Patient has not started Repatha yet. Encouraged her to go ahead and start.  All tests have been ordered. Patient is aware that scheduling team will reach out to her to get these set up.

## 2021-11-19 NOTE — Telephone Encounter (Signed)
-----   Message from Antonieta Iba, RN sent at 11/19/2021  1:19 PM EDT ----- Regarding: Itamar Study Itamar sleep study has been ordered by Dr. Radford Pax. Can you arrange a time for the patient to come in and be set up? Thank you!!

## 2021-11-19 NOTE — Telephone Encounter (Signed)
-----   Message from Sueanne Margarita, MD sent at 11/16/2021  3:21 PM EDT ----- Her last echo showed dilated RV and PHTN - please get PFTs with DLCO, VQ scan to rule out PE and Itamar HST

## 2021-11-19 NOTE — Telephone Encounter (Signed)
Left message for patient to call back  

## 2021-11-19 NOTE — Telephone Encounter (Signed)
Radiology is reporting an abnormal results

## 2021-11-19 NOTE — Telephone Encounter (Signed)
Radiology called about critical findings on CT over read - "Left-sided mediastinal mass or lymphadenopathy is incompletely visualized, but measures at least 3.6 cm. Recommend PET-CT or CAP CT with contrast for further evaluation."

## 2021-11-19 NOTE — Telephone Encounter (Signed)
Patient is returning call.  °

## 2021-11-22 NOTE — Telephone Encounter (Signed)
ok to activate itamar. [name] is offline .  please let her know. IDK how this came to me.

## 2021-11-28 ENCOUNTER — Telehealth: Payer: Self-pay

## 2021-11-28 ENCOUNTER — Ambulatory Visit (HOSPITAL_COMMUNITY)
Admission: RE | Admit: 2021-11-28 | Discharge: 2021-11-28 | Disposition: A | Discharge status: Home / Self Care | Payer: Medicare PPO | Source: Ambulatory Visit | Attending: Cardiology | Admitting: Cardiology

## 2021-11-28 DIAGNOSIS — R918 Other nonspecific abnormal finding of lung field: Secondary | ICD-10-CM | POA: Diagnosis not present

## 2021-11-28 DIAGNOSIS — R222 Localized swelling, mass and lump, trunk: Secondary | ICD-10-CM | POA: Insufficient documentation

## 2021-11-28 DIAGNOSIS — J9859 Other diseases of mediastinum, not elsewhere classified: Secondary | ICD-10-CM | POA: Insufficient documentation

## 2021-11-28 DIAGNOSIS — N838 Other noninflammatory disorders of ovary, fallopian tube and broad ligament: Secondary | ICD-10-CM | POA: Diagnosis not present

## 2021-11-28 DIAGNOSIS — M488X4 Other specified spondylopathies, thoracic region: Secondary | ICD-10-CM | POA: Diagnosis not present

## 2021-11-28 LAB — GLUCOSE, CAPILLARY: Glucose-Capillary: 116 mg/dL — ABNORMAL HIGH (ref 70–99)

## 2021-11-28 MED ORDER — FLUDEOXYGLUCOSE F - 18 (FDG) INJECTION
8.5000 | Freq: Once | INTRAVENOUS | Status: AC
  Administered 2021-11-28: 8.61 via INTRAVENOUS

## 2021-11-28 NOTE — Telephone Encounter (Signed)
The patient has been notified of the result and verbalized understanding.  All questions (if any) were answered. [name] Iba, RN 11/28/2021 1:19 PM    Left message with patient's PCP to call back. Patient is also going to reach out to PCP's office to get an appointment. Results have been sent to Dr. Inda Merlin.

## 2021-11-28 NOTE — Telephone Encounter (Signed)
-----   Message from Sueanne Margarita, MD sent at 11/28/2021 12:45 PM EDT ----- PET scan showed moderate hypermetabolic activity suspicious for neoplasm and a left mediastinal mass which could represent atypical thymoma versus lymphoma.  There is also hypermetabolic lesion at J67 and MRI of the thoracic spine was recommended with and without contrast.  She also has a ovarian cyst that appears benign.  Please forward this report to Dr. Inda Merlin her PCP.  Please call his office and get her in this week or early next week for follow-up of these abnormalities

## 2021-11-29 NOTE — Telephone Encounter (Signed)
See previous notes. Pt is going to hold off for awhile. States she has a lot going on right now.

## 2021-12-05 DIAGNOSIS — J9859 Other diseases of mediastinum, not elsewhere classified: Secondary | ICD-10-CM | POA: Diagnosis not present

## 2021-12-05 DIAGNOSIS — R948 Abnormal results of function studies of other organs and systems: Secondary | ICD-10-CM | POA: Diagnosis not present

## 2021-12-11 ENCOUNTER — Telehealth: Payer: Self-pay | Admitting: Internal Medicine

## 2021-12-11 NOTE — Telephone Encounter (Signed)
Scheduled appt per 8/31 referral. Pt is aware of appt date and time. Pt is aware to arrive 15 mins prior to appt time and to bring and updated insurance card. Pt is aware of appt location.   

## 2021-12-26 ENCOUNTER — Other Ambulatory Visit: Payer: Self-pay

## 2021-12-26 DIAGNOSIS — J9859 Other diseases of mediastinum, not elsewhere classified: Secondary | ICD-10-CM

## 2021-12-27 ENCOUNTER — Inpatient Hospital Stay: Payer: Medicare PPO

## 2021-12-27 ENCOUNTER — Inpatient Hospital Stay: Payer: Medicare PPO | Attending: Internal Medicine | Admitting: Internal Medicine

## 2021-12-27 ENCOUNTER — Encounter: Payer: Self-pay | Admitting: Internal Medicine

## 2021-12-27 ENCOUNTER — Other Ambulatory Visit: Payer: Self-pay

## 2021-12-27 VITALS — BP 160/78 | HR 57 | Temp 98.2°F | Resp 15 | Wt 167.4 lb

## 2021-12-27 DIAGNOSIS — I2510 Atherosclerotic heart disease of native coronary artery without angina pectoris: Secondary | ICD-10-CM | POA: Diagnosis not present

## 2021-12-27 DIAGNOSIS — I272 Pulmonary hypertension, unspecified: Secondary | ICD-10-CM | POA: Diagnosis not present

## 2021-12-27 DIAGNOSIS — R0602 Shortness of breath: Secondary | ICD-10-CM | POA: Insufficient documentation

## 2021-12-27 DIAGNOSIS — R222 Localized swelling, mass and lump, trunk: Secondary | ICD-10-CM | POA: Diagnosis not present

## 2021-12-27 DIAGNOSIS — Z79899 Other long term (current) drug therapy: Secondary | ICD-10-CM | POA: Insufficient documentation

## 2021-12-27 DIAGNOSIS — I10 Essential (primary) hypertension: Secondary | ICD-10-CM | POA: Insufficient documentation

## 2021-12-27 DIAGNOSIS — J9859 Other diseases of mediastinum, not elsewhere classified: Secondary | ICD-10-CM

## 2021-12-27 LAB — CMP (CANCER CENTER ONLY)
ALT: 18 U/L (ref 0–44)
AST: 20 U/L (ref 15–41)
Albumin: 3.9 g/dL (ref 3.5–5.0)
Alkaline Phosphatase: 68 U/L (ref 38–126)
Anion gap: 7 (ref 5–15)
BUN: 15 mg/dL (ref 8–23)
CO2: 25 mmol/L (ref 22–32)
Calcium: 9.5 mg/dL (ref 8.9–10.3)
Chloride: 106 mmol/L (ref 98–111)
Creatinine: 0.94 mg/dL (ref 0.44–1.00)
GFR, Estimated: >60 mL/min (ref >60)
Glucose, Bld: 104 mg/dL — ABNORMAL HIGH (ref 70–99)
Potassium: 4.2 mmol/L (ref 3.5–5.1)
Sodium: 138 mmol/L (ref 135–145)
Total Bilirubin: 0.5 mg/dL (ref 0.3–1.2)
Total Protein: 7.1 g/dL (ref 6.5–8.1)

## 2021-12-27 LAB — CBC WITH DIFFERENTIAL (CANCER CENTER ONLY)
Abs Immature Granulocytes: 0.03 10*3/uL (ref 0.00–0.07)
Basophils Absolute: 0.1 10*3/uL (ref 0.0–0.1)
Basophils Relative: 1 %
Eosinophils Absolute: 0.1 10*3/uL (ref 0.0–0.5)
Eosinophils Relative: 1 %
HCT: 36.3 % (ref 36.0–46.0)
Hemoglobin: 12.4 g/dL (ref 12.0–15.0)
Immature Granulocytes: 0 %
Lymphocytes Relative: 35 %
Lymphs Abs: 2.6 10*3/uL (ref 0.7–4.0)
MCH: 32.4 pg (ref 26.0–34.0)
MCHC: 34.2 g/dL (ref 30.0–36.0)
MCV: 94.8 fL (ref 80.0–100.0)
Monocytes Absolute: 0.5 10*3/uL (ref 0.1–1.0)
Monocytes Relative: 6 %
Neutro Abs: 4.3 10*3/uL (ref 1.7–7.7)
Neutrophils Relative %: 57 %
Platelet Count: 200 10*3/uL (ref 150–400)
RBC: 3.83 MIL/uL — ABNORMAL LOW (ref 3.87–5.11)
RDW: 13 % (ref 11.5–15.5)
WBC Count: 7.4 10*3/uL (ref 4.0–10.5)
nRBC: 0 % (ref 0.0–0.2)

## 2021-12-27 NOTE — Progress Notes (Signed)
[facility] Telephone:([phone]) [phone]   Fax:([phone]  CONSULT NOTE  REFERRING PHYSICIAN: Dr. Fransico Him  REASON FOR CONSULTATION:  81 years old white female with suspicious left mediastinal mass  HPI [name] is a 81 y.o. female with past medical history significant for multiple medical problems including history of aortic stenosis status post AVR under the care of Dr. Nadean Corwin, ascending aortic dilatation, carotid artery disease, anxiety, hypertension, dyslipidemia, coronary artery disease as well as history of pulmonary hypertension and vitamin D deficiency.  The patient has no history of smoking.  She was seen by her cardiologist Dr. Radford Pax for routine evaluation of her cardiac disease and she had CT coronary performed on 11/16/2021 and incidentally it showed left-sided mediastinal mass or lymphadenopathy that is incompletely visualized but measures at least 3.6 cm.  This was followed by a PET scan on 11/28/2021 and that showed the previously demonstrated soft tissue mass lateral to the main pulmonary artery measures 3.7 x 2.8 cm with intermediate metabolic activity with SUV max of 5.6.  There was no other hypermetabolic mediastinal, hilar or axillary lymph nodes identified.  There was no hypermetabolic pulmonary activity or suspicious nodularity and no evidence of distant metastatic disease but there was incidental low-density left Actinex the lung lesion measuring 4.9 x 3.1 cm without metabolic activity and an intensely hypermetabolic lesion involving the T11 vertebral body corresponding to a nonspecific lucent lesion anteriorly measuring 2.0 x 1.3 cm.  The patient was referred to me today for evaluation and recommendation regarding her condition. Today she denied having any concerning complaints except for occasional shortness of breath with exertion but no significant chest pain, cough or hemoptysis.  She has no nausea, vomiting, diarrhea or constipation.  She has no  headache or visual changes.  She has no recent weight loss or night sweats. Family history significant for mother with dementia and father with heart disease. The patient is a widow and has 3 sons.  She was accompanied by her son [name] today.  She is a retired Pharmacist, hospital.  The patient has no history of smoking, alcohol or drug abuse.  HPI  Past Medical History:  Diagnosis Date   Abnormal vaginal Pap smear 1994   annual paps for years after that.more recently every other year,last in 2012-we agreed not to do them anymore   Anxiety    no rx   Aortic stenosis    s/p AVR with bioprosthesis   Ascending aorta dilatation (HCC)    37mm by echo 10/2021   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/2015   Carotid artery stenosis    < 50% stenosis bilaterally by doppler 07/2016   Coronary artery disease 2008   Coronary Ca score of 331 with minimal multivessel plaque < 25% stenosis by coronary CTA 8/23   Hypercholesteremia    Hypertension    Obesity    Osteopenia    declines treatment   Pneumonia 1995   Pulmonary HTN ([place])    mild to moderate by echo 7/23 with PASP 23mmHg   Shoulder pain    Due to arthritis   Vitamin D insufficiency     Past Surgical History:  Procedure Laterality Date   AORTIC VALVE REPLACEMENT N/A 10/14/2013   Procedure: AORTIC VALVE REPLACEMENT (AVR);  Surgeon: Gaye Pollack, MD;  Location: [facility];  Service: Open Heart Surgery;  Laterality: N/A;   CARDIAC CATHETERIZATION     CHOLECYSTECTOMY  90   INTRAOPERATIVE TRANSESOPHAGEAL ECHOCARDIOGRAM N/A 10/14/2013   Procedure: INTRAOPERATIVE TRANSESOPHAGEAL ECHOCARDIOGRAM;  Surgeon: Gaye Pollack, MD;  Location: [facility];  Service: Open Heart Surgery;  Laterality: N/A;   LEFT AND RIGHT HEART CATHETERIZATION WITH CORONARY ANGIOGRAM N/A 09/16/2013   Procedure: LEFT AND RIGHT HEART CATHETERIZATION WITH CORONARY ANGIOGRAM;  Surgeon: Sueanne Margarita, MD;  Location: [facility] CATH [facility];  Service: Cardiovascular;  Laterality: N/A;    Family History  Problem Relation Age of  Onset   Dementia Mother    Heart disease Father    Heart attack Father    Heart disease Brother    Heart attack Brother    Arrhythmia Sister    Heart attack Maternal Grandfather     Social History Social History   Tobacco Use   Smoking status: Never   Smokeless tobacco: Never  Vaping Use   Vaping Use: Never used  Substance Use Topics   Alcohol use: No   Drug use: No    Allergies  Allergen Reactions   Crestor [Rosuvastatin Calcium] Other (See Comments)    Myalgias with 5mg  daily dose   Lipitor [Atorvastatin] Other (See Comments)    Stomach pain   Pravastatin Other (See Comments)    Myalgias    Simvastatin Other (See Comments)    Myalgias    Tramadol Other (See Comments)    FEELS LOOPY   Zetia [Ezetimibe]     Myalgias   Codeine Palpitations   Penicillins Palpitations   Sulfa Antibiotics Palpitations    Current Outpatient Medications  Medication Sig Dispense Refill   Evolocumab (REPATHA SURECLICK) 010 MG/ML SOAJ Inject 1 Pen into the skin every 14 (fourteen) days. 6 mL 3   hydrochlorothiazide (MICROZIDE) 12.5 MG capsule Take 1 capsule (12.5 mg total) by mouth daily. 90 capsule 3   lisinopril (ZESTRIL) 10 MG tablet Take 1 tablet by mouth once daily 30 tablet 11   No current facility-administered medications for this visit.    Review of Systems  Constitutional: negative Eyes: negative Ears, nose, mouth, throat, and face: negative Respiratory: positive for dyspnea on exertion Cardiovascular: negative Gastrointestinal: negative Genitourinary:negative Integument/breast: negative Hematologic/lymphatic: negative Musculoskeletal:negative Neurological: negative Behavioral/Psych: negative Endocrine: negative Allergic/Immunologic: negative  Physical Exam  XNA:TFTDD, healthy, no distress, well nourished, and well developed SKIN: skin color, texture, turgor are normal, no rashes or significant lesions HEAD: Normocephalic, No masses, lesions, tenderness or  abnormalities EYES: normal, PERRLA, Conjunctiva are pink and non-injected EARS: External ears normal, Canals clear OROPHARYNX:no exudate, no erythema, and lips, buccal mucosa, and tongue normal  NECK: supple, no adenopathy, no JVD LYMPH:  no palpable lymphadenopathy, no hepatosplenomegaly BREAST:not examined LUNGS: clear to auscultation , and palpation HEART: regular rate & rhythm, no murmurs, and no gallops ABDOMEN:abdomen soft, non-tender, normal bowel sounds, and no masses or organomegaly BACK: Back symmetric, no curvature., No CVA tenderness EXTREMITIES:no joint deformities, effusion, or inflammation, no edema  NEURO: alert & oriented x 3 with fluent speech, no focal motor/sensory deficits  PERFORMANCE STATUS: ECOG 1  LABORATORY DATA: Lab Results  Component Value Date   WBC 7.4 12/27/2021   HGB 12.4 12/27/2021   HCT 36.3 12/27/2021   MCV 94.8 12/27/2021   PLT 200 12/27/2021      Chemistry      Component Value Date/Time   NA 141 10/06/2020 0939   K 4.0 10/06/2020 0939   CL 103 10/06/2020 0939   CO2 25 10/06/2020 0939   BUN 17 10/06/2020 0939   CREATININE 1.04 (H) 10/06/2020 0939   CREATININE 0.85 01/29/2016 0910      Component Value Date/Time  CALCIUM 9.2 10/06/2020 0939   ALKPHOS 57 01/28/2017 0957   AST 20 01/28/2017 0957   ALT 23 09/18/2021 0913   BILITOT 0.3 01/28/2017 0957       RADIOGRAPHIC STUDIES: NM PET Image Initial (PI) Skull Base To Thigh (F-18 FDG)  Result Date: 11/28/2021 CLINICAL DATA:  Initial treatment strategy for mediastinal mass on cardiac CT. EXAM: NUCLEAR MEDICINE PET SKULL BASE TO THIGH TECHNIQUE: 8.61 mCi F-18 FDG was injected intravenously. Full-ring PET imaging was performed from the skull base to thigh after the radiotracer. CT data was obtained and used for attenuation correction and anatomic localization. Fasting blood glucose: 116 mg/dl COMPARISON:  Cardiac CT 11/16/2021 FINDINGS: Mediastinal blood pool activity: SUV max 3.2 NECK: No  hypermetabolic cervical lymph nodes are identified.Symmetric activity within the lymphoid tissue of Waldeyer's ring is within physiologic limits. No suspicious activity identified within the pharyngeal mucosal space. Incidental CT findings: none CHEST: The previously demonstrated soft tissue mass lateral to the main pulmonary artery measures 3.7 x 2.8 cm in demonstrates intermediate metabolic activity (SUV max 5.6). No other hypermetabolic mediastinal, hilar or axillary lymph nodes are identified. No hypermetabolic pulmonary activity or suspicious nodularity. Incidental CT findings: Status post median sternotomy, AVR and CABG. Diffuse atherosclerosis of the aorta, great vessels and coronary arteries. Mild emphysematous changes. ABDOMEN/PELVIS: There is no hypermetabolic activity within the liver, adrenal glands, spleen or pancreas. There is no hypermetabolic nodal activity in the abdomen or pelvis. Bowel activity within physiologic limits. Incidental CT findings: Status post cholecystectomy. Aortic and branch vessel atherosclerosis. There is a low-density left adnexal lesion measuring 4.9 x 3.1 cm on image 322/0, without metabolic activity. SKELETON: There is an intensely hypermetabolic lesion involving the T11 vertebral body, corresponding with a nonspecific lucent lesion anteriorly measuring 2.0 x 1.3 cm on image 93/4 (SUV max 9.2). No other suspicious osseous activity. Incidental CT findings: Multilevel spondylosis. 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 Resulting biforaminal narrowing. IMPRESSION: 1. The recently demonstrated left mediastinal mass demonstrates moderate hypermetabolic activity, suspicious for neoplasm. The differential remains broad and includes lymphadenopathy/lymphoma as well as atypical thymoma. Management options include tissue sampling and surveillance given the patient's age. 2. No other hypermetabolic lymph nodes in the neck, chest, abdomen or pelvis. 3.  Indeterminate hypermetabolic lucent lesion in the T11 vertebral body which may be better characterized with MRI of the thoracic spine without and with contrast. 4. Cystic appearing left adnexal lesion without metabolic activity, most likely a benign cyst. Recommend follow-up US in 6-12 months. Note: This recommendation does not apply to premenarchal patients and to those with increased risk (genetic, family history, elevated tumor markers or other high-risk factors) of ovarian cancer. Reference: JACR 2020 Feb; 17(2):248-254 Electronically Signed   By: Richardean Sale M.D.   On: 11/28/2021 09:35    ASSESSMENT: This is a very pleasant 81 years old white female with left mediastinal mass suspicious for thymoma/lymphoma pending tissue diagnosis.   PLAN: I had a lengthy discussion with the patient and her son today about her current condition and treatment options.  I personally and independently reviewed the scan images and discussed the result and showed the images to the patient and her son. The patient has a good performance status and doing very good compared to other people with her age. I recommended for her to have pulmonary function test performed for evaluation of her pulmonary function. I will refer the patient to cardiothoracic surgery for consideration of surgical resection of the left  anterior mediastinal mass and confirmation of the tissue diagnosis and also as a therapeutic option. I will arrange for the patient to come back for follow-up visit after her surgical evaluation and if she is not a good surgical candidate she would be considered for alternative treatment based on the final pathology. The patient and her son are in agreement with the current plan. She was advised to call immediately if she has any other concerning symptoms in the interval.  The patient voices understanding of current disease status and treatment options and is in agreement with the current care plan.  All  questions were answered. The patient knows to call the clinic with any problems, questions or concerns. We can certainly see the patient much sooner if necessary.  Thank you so much for allowing me to participate in the care of [name]. I will continue to follow up the patient with you and assist in her care.  The total time spent in the appointment was 60 minutes.  Disclaimer: This note was dictated with voice recognition software. Similar sounding words can inadvertently be transcribed and may not be corrected upon review.   Eilleen Kempf December 27, 2021, 12:01 PM

## 2022-01-07 ENCOUNTER — Ambulatory Visit: Payer: Medicare PPO | Attending: Cardiology

## 2022-01-07 DIAGNOSIS — E7800 Pure hypercholesterolemia, unspecified: Secondary | ICD-10-CM

## 2022-01-07 LAB — LIPID PANEL
Chol/HDL Ratio: 5.3 ratio — ABNORMAL HIGH (ref 0.0–4.4)
Cholesterol, Total: 240 mg/dL — ABNORMAL HIGH (ref 100–199)
HDL: 45 mg/dL (ref >39)
LDL Chol Calc (NIH): 158 mg/dL — ABNORMAL HIGH (ref 0–99)
Triglycerides: 200 mg/dL — ABNORMAL HIGH (ref 0–149)
VLDL Cholesterol Cal: 37 mg/dL (ref 5–40)

## 2022-01-07 LAB — ALT: ALT: 19 IU/L (ref 0–32)

## 2022-01-15 ENCOUNTER — Institutional Professional Consult (permissible substitution): Payer: Medicare PPO | Admitting: Thoracic Surgery (Cardiothoracic Vascular Surgery)

## 2022-01-15 ENCOUNTER — Encounter: Payer: Self-pay | Admitting: Thoracic Surgery (Cardiothoracic Vascular Surgery)

## 2022-01-15 VITALS — BP 174/78 | HR 57 | Ht 61.0 in | Wt 166.0 lb

## 2022-01-15 DIAGNOSIS — J9859 Other diseases of mediastinum, not elsewhere classified: Secondary | ICD-10-CM

## 2022-01-15 NOTE — H&P (View-Only) (Signed)
PCP is [name] Huddle, MD Referring Provider is [name] Bears, MD  Chief Complaint  Patient presents with   Mediastinal Mass    Initial surgical consult, PET 8/23, CT Coronary 8/11   [name] is sent for consultation re: a mediastinal mass  [name] is an 81 year-old woman with a history of aortic stenosis, s/p AVR, CAD, ascending aneurysm, bradycardia, hypertension, hyperlipidemia, mild to moderate pulmonary hypertension, pneumonia, mild ECCOD, obesity and osteopenia.    Recently had a coronary CT. It showed mild CAD and also an incompletely visualized mediastinal mass.  A PET CT showed the mass was 3.7 x 2.8 cm and was hypermetabolic with an SUV of 5.6.  She denies chest pain, shortness of breath, fever, night sweats, weakness, trouble swallowing and double vision.  Zubrod Score: At the time of surgery this patient's most appropriate activity status/level should be described as: []     0    Normal activity, no symptoms [x]     1    Restricted in physical strenuous activity but ambulatory, able to do out light work []     2    Ambulatory and capable of self care, unable to do work activities, up and about >50 % of waking hours                              []     3    Only limited self care, in bed greater than 50% of waking hours []     4    Completely disabled, no self care, confined to bed or chair []     5    Moribund   Past Medical History:  Diagnosis Date   Abnormal vaginal Pap smear 1994   annual paps for years after that.more recently every other year,last in 2012-we agreed not to do them anymore   Anxiety    no rx   Aortic stenosis    s/p AVR with bioprosthesis   Ascending aorta dilatation (HCC)    34mm by echo 10/2021   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/2015   Carotid artery stenosis    < 50% stenosis bilaterally by doppler 07/2016   Coronary artery disease 2008   Coronary Ca score of 331 with minimal multivessel plaque < 25% stenosis by coronary CTA 8/23   Hypercholesteremia     Hypertension    Obesity    Osteopenia    declines treatment   Pneumonia 1995   Pulmonary HTN ([place])    mild to moderate by echo 7/23 with PASP 46mmHg   Shoulder pain    Due to arthritis   Vitamin D insufficiency     Past Surgical History:  Procedure Laterality Date   AORTIC VALVE REPLACEMENT N/A 10/14/2013   Procedure: AORTIC VALVE REPLACEMENT (AVR);  Surgeon: Gaye Pollack, MD;  Location: [facility];  Service: Open Heart Surgery;  Laterality: N/A;   CARDIAC CATHETERIZATION     CHOLECYSTECTOMY  90   INTRAOPERATIVE TRANSESOPHAGEAL ECHOCARDIOGRAM N/A 10/14/2013   Procedure: INTRAOPERATIVE TRANSESOPHAGEAL ECHOCARDIOGRAM;  Surgeon: Gaye Pollack, MD;  Location: [facility];  Service: Open Heart Surgery;  Laterality: N/A;   LEFT AND RIGHT HEART CATHETERIZATION WITH CORONARY ANGIOGRAM N/A 09/16/2013   Procedure: LEFT AND RIGHT HEART CATHETERIZATION WITH CORONARY ANGIOGRAM;  Surgeon: Sueanne Margarita, MD;  Location: [facility];  Service: Cardiovascular;  Laterality: N/A;    Family History  Problem Relation Age of Onset   Dementia Mother    Heart disease  Father    Heart attack Father    Heart disease Brother    Heart attack Brother    Arrhythmia Sister    Heart attack Maternal Grandfather     Social History Social History   Tobacco Use   Smoking status: Never   Smokeless tobacco: Never  Vaping Use   Vaping Use: Never used  Substance Use Topics   Alcohol use: No   Drug use: No    Current Outpatient Medications  Medication Sig Dispense Refill   Evolocumab (REPATHA SURECLICK) 778 MG/ML SOAJ Inject 1 Pen into the skin every 14 (fourteen) days. 6 mL 3   hydrochlorothiazide (MICROZIDE) 12.5 MG capsule Take 1 capsule (12.5 mg total) by mouth daily. 90 capsule 3   lisinopril (ZESTRIL) 10 MG tablet Take 1 tablet by mouth once daily 30 tablet 11   No current facility-administered medications for this visit.    Allergies  Allergen Reactions   Crestor [Rosuvastatin Calcium] Other (See  Comments)    Myalgias with 5mg  daily dose   Lipitor [Atorvastatin] Other (See Comments)    Stomach pain   Pravastatin Other (See Comments)    Myalgias    Simvastatin Other (See Comments)    Myalgias    Tramadol Other (See Comments)    FEELS LOOPY   Zetia [Ezetimibe]     Myalgias   Codeine Palpitations   Penicillins Palpitations   Sulfa Antibiotics Palpitations    Review of Systems  Constitutional:  Positive for fatigue. Negative for activity change and unexpected weight change.  HENT:  Negative for trouble swallowing and voice change.   Eyes:  Negative for visual disturbance.  Respiratory:  Negative for chest tightness and shortness of breath.   Cardiovascular:  Negative for chest pain and leg swelling.  Gastrointestinal:  Negative for abdominal distention and abdominal pain.  Genitourinary:  Negative for difficulty urinating and dysuria.  Neurological:  Negative for dizziness, speech difficulty and weakness.  Hematological:  Negative for adenopathy. Does not bruise/bleed easily.  All other systems reviewed and are negative.   BP (!) 174/78 (BP Location: Left Arm, Patient Position: Sitting)   Pulse (!) 57   Ht 5\' 1"  (1.549 m)   Wt 166 lb (75.3 kg)   SpO2 98% Comment: RA  BMI 31.37 kg/m  Physical Exam Vitals reviewed.  Constitutional:      General: She is not in acute distress.    Appearance: She is obese.  HENT:     Head: Normocephalic and atraumatic.  Eyes:     General: No scleral icterus.    Extraocular Movements: Extraocular movements intact.  Neck:     Vascular: No carotid bruit.  Cardiovascular:     Rate and Rhythm: Normal rate and regular rhythm.     Heart sounds: Murmur (2/6 systolic) heard.  Pulmonary:     Effort: Pulmonary effort is normal. No respiratory distress.     Breath sounds: Normal breath sounds. No wheezing or rales.  Abdominal:     General: There is no distension.     Palpations: Abdomen is soft.  Lymphadenopathy:     Cervical: No  cervical adenopathy.  Skin:    General: Skin is warm and dry.  Neurological:     General: No focal deficit present.     Mental Status: She is alert and oriented to person, place, and time.     Cranial Nerves: No cranial nerve deficit.     Motor: No weakness.      Diagnostic Tests: NUCLEAR  MEDICINE PET SKULL BASE TO THIGH   TECHNIQUE: 8.61 mCi F-18 FDG was injected intravenously. Full-ring PET imaging was performed from the skull base to thigh after the radiotracer. CT data was obtained and used for attenuation correction and anatomic localization.   Fasting blood glucose: 116 mg/dl   COMPARISON:  Cardiac CT 11/16/2021   FINDINGS: Mediastinal blood pool activity: SUV max 3.2   NECK:   No hypermetabolic cervical lymph nodes are identified.Symmetric activity within the lymphoid tissue of Waldeyer's ring is within physiologic limits. No suspicious activity identified within the pharyngeal mucosal space.   Incidental CT findings: none   CHEST:   The previously demonstrated soft tissue mass lateral to the main pulmonary artery measures 3.7 x 2.8 cm in demonstrates intermediate metabolic activity (SUV max 5.6). No other hypermetabolic mediastinal, hilar or axillary lymph nodes are identified. No hypermetabolic pulmonary activity or suspicious nodularity.   Incidental CT findings: Status post median sternotomy, AVR and CABG. Diffuse atherosclerosis of the aorta, great vessels and coronary arteries. Mild emphysematous changes.   ABDOMEN/PELVIS:   There is no hypermetabolic activity within the liver, adrenal glands, spleen or pancreas. There is no hypermetabolic nodal activity in the abdomen or pelvis. Bowel activity within physiologic limits.   Incidental CT findings: Status post cholecystectomy. Aortic and branch vessel atherosclerosis. There is a low-density left adnexal lesion measuring 4.9 x 3.1 cm on image 737/1, without metabolic activity.   SKELETON:    There is an intensely hypermetabolic lesion involving the T11 vertebral body, corresponding with a nonspecific lucent lesion anteriorly measuring 2.0 x 1.3 cm on image 93/4 (SUV max 9.2). No other suspicious osseous activity.   Incidental CT findings: Multilevel spondylosis. 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 Resulting biforaminal narrowing.   IMPRESSION: 1. The recently demonstrated left mediastinal mass demonstrates moderate hypermetabolic activity, suspicious for neoplasm. The differential remains broad and includes lymphadenopathy/lymphoma as well as atypical thymoma. Management options include tissue sampling and surveillance given the patient's age. 2. No other hypermetabolic lymph nodes in the neck, chest, abdomen or pelvis. 3. Indeterminate hypermetabolic lucent lesion in the T11 vertebral body which may be better characterized with MRI of the thoracic spine without and with contrast. 4. Cystic appearing left adnexal lesion without metabolic activity, most likely a benign cyst. Recommend follow-up US in 6-12 months. Note: This recommendation does not apply to premenarchal patients and to those with increased risk (genetic, family history, elevated tumor markers or other high-risk factors) of ovarian cancer. Reference: JACR 2020 Feb; 17(2):248-254     Electronically Signed   By: Richardean Sale M.D.   On: 11/28/2021 09:35 I personally reviewed the PET/CT images.  There is a 3.7 x 2.8 cm hypermetabolic mass in the anterior/middle mediastinum.  Differential is primarily thymoma versus lymphoma.  It is isolated to this one spot so lymphoma is not as likely.  Ectopic thyroid and teratoma are also in the differential but are less likely.  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    Anterior/ middle mediastinal mass-incidentally noted on a CT for coronary calcium screening.  Markedly hypermetabolic on PET/CT.  Differential is primarily thymoma versus lymphoma.  Not easily accessible for needle biopsy.  I recommended to Mrs. Berline Lopes and her son that we proceed with a robotic left VATS for resection of the mass for definitive  diagnosis of the mass and hopefully definitive treatment at the same setting.  If it is lymphoma, that might not be the case.  I described the proposed operative procedure to Mrs. Alling and her son.  I informed them of the general nature of the procedure including the need for general anesthesia, the incisions to be used, the use of the surgical robot, use of a drain postoperatively, the expected hospital stay, and the overall recovery.  I informed them of the indications, risks, benefits, and alternatives.  They understand the risks include, but not limited to death, MI, DVT, PE, bleeding, possible need for transfusion, infection, recurrent and/or phrenic nerve dysfunction, as well as possibility of other unforeseeable complications.  They understand the degree of pain is variable and unpredictable.  She accepts the risks and agrees to proceed.  Plan: Robotic assisted left VATS for resection of mediastinal mass  Melrose Nakayama, MD [facility] [phone]

## 2022-01-15 NOTE — Progress Notes (Signed)
PCP is Josetta Huddle, MD Referring Provider is Curt Bears, MD  Chief Complaint  Patient presents with   Mediastinal Mass    Initial surgical consult, PET 8/23, CT Coronary 8/11   Mrs. 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    Recently had a coronary CT. It showed mild CAD and also an incompletely visualized mediastinal mass.  A PET CT showed the mass was 3.7 x 2.8 cm and was hypermetabolic with an SUV of 5.6.  She denies chest pain, shortness of breath, fever, night sweats, weakness, trouble swallowing and double vision.  Zubrod Score: At the time of surgery this patient's most appropriate activity status/level should be described as: []     0    Normal activity, no symptoms [x]     1    Restricted in physical strenuous activity but ambulatory, able to do out light work []     2    Ambulatory and capable of self care, unable to do work activities, up and about >50 % of waking hours                              []     3    Only limited self care, in bed greater than 50% of waking hours []     4    Completely disabled, no self care, confined to bed or chair []     5    Moribund   Past Medical History:  Diagnosis Date   Abnormal vaginal Pap smear 1994   annual paps for years after that.more recently every other year,last in 2012-we agreed not to do them anymore   Anxiety    no rx   Aortic stenosis    s/p AVR with bioprosthesis   Ascending aorta dilatation (HCC)    48mm by echo 10/2021   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/2015   Carotid artery stenosis    < 50% stenosis bilaterally by doppler 07/2016   Coronary artery disease 2008   Coronary Ca score of 331 with minimal multivessel plaque < 25% stenosis by coronary CTA 8/23   Hypercholesteremia     Hypertension    Obesity    Osteopenia    declines treatment   Pneumonia 1995   Pulmonary HTN ([place])    mild to moderate by echo 7/23 with PASP 62mmHg   Shoulder pain    Due to arthritis   Vitamin D insufficiency     Past Surgical History:  Procedure Laterality Date   AORTIC VALVE REPLACEMENT N/A 10/14/2013   Procedure: AORTIC VALVE REPLACEMENT (AVR);  Surgeon: Gaye Pollack, MD;  Location: [facility];  Service: Open Heart Surgery;  Laterality: N/A;   CARDIAC CATHETERIZATION     CHOLECYSTECTOMY  90   INTRAOPERATIVE TRANSESOPHAGEAL ECHOCARDIOGRAM N/A 10/14/2013   Procedure: INTRAOPERATIVE TRANSESOPHAGEAL ECHOCARDIOGRAM;  Surgeon: Gaye Pollack, MD;  Location: [facility];  Service: Open Heart Surgery;  Laterality: N/A;   LEFT AND RIGHT HEART CATHETERIZATION WITH CORONARY ANGIOGRAM N/A 09/16/2013   Procedure: LEFT AND RIGHT HEART CATHETERIZATION WITH CORONARY ANGIOGRAM;  Surgeon: Sueanne Margarita, MD;  Location: [facility];  Service: Cardiovascular;  Laterality: N/A;    Family History  Problem Relation Age of Onset   Dementia Mother    Heart disease  Father    Heart attack Father    Heart disease Brother    Heart attack Brother    Arrhythmia Sister    Heart attack Maternal Grandfather     Social History Social History   Tobacco Use   Smoking status: Never   Smokeless tobacco: Never  Vaping Use   Vaping Use: Never used  Substance Use Topics   Alcohol use: No   Drug use: No    Current Outpatient Medications  Medication Sig Dispense Refill   Evolocumab (REPATHA SURECLICK) 742 MG/ML SOAJ Inject 1 Pen into the skin every 14 (fourteen) days. 6 mL 3   hydrochlorothiazide (MICROZIDE) 12.5 MG capsule Take 1 capsule (12.5 mg total) by mouth daily. 90 capsule 3   lisinopril (ZESTRIL) 10 MG tablet Take 1 tablet by mouth once daily 30 tablet 11   No current facility-administered medications for this visit.    Allergies  Allergen Reactions   Crestor [Rosuvastatin Calcium] Other (See  Comments)    Myalgias with 5mg  daily dose   Lipitor [Atorvastatin] Other (See Comments)    Stomach pain   Pravastatin Other (See Comments)    Myalgias    Simvastatin Other (See Comments)    Myalgias    Tramadol Other (See Comments)    FEELS LOOPY   Zetia [Ezetimibe]     Myalgias   Codeine Palpitations   Penicillins Palpitations   Sulfa Antibiotics Palpitations    Review of Systems  Constitutional:  Positive for fatigue. Negative for activity change and unexpected weight change.  HENT:  Negative for trouble swallowing and voice change.   Eyes:  Negative for visual disturbance.  Respiratory:  Negative for chest tightness and shortness of breath.   Cardiovascular:  Negative for chest pain and leg swelling.  Gastrointestinal:  Negative for abdominal distention and abdominal pain.  Genitourinary:  Negative for difficulty urinating and dysuria.  Neurological:  Negative for dizziness, speech difficulty and weakness.  Hematological:  Negative for adenopathy. Does not bruise/bleed easily.  All other systems reviewed and are negative.   BP (!) 174/78 (BP Location: Left Arm, Patient Position: Sitting)   Pulse (!) 57   Ht 5\' 1"  (1.549 m)   Wt 166 lb (75.3 kg)   SpO2 98% Comment: RA  BMI 31.37 kg/m  Physical Exam Vitals reviewed.  Constitutional:      General: She is not in acute distress.    Appearance: She is obese.  HENT:     Head: Normocephalic and atraumatic.  Eyes:     General: No scleral icterus.    Extraocular Movements: Extraocular movements intact.  Neck:     Vascular: No carotid bruit.  Cardiovascular:     Rate and Rhythm: Normal rate and regular rhythm.     Heart sounds: Murmur (2/6 systolic) heard.  Pulmonary:     Effort: Pulmonary effort is normal. No respiratory distress.     Breath sounds: Normal breath sounds. No wheezing or rales.  Abdominal:     General: There is no distension.     Palpations: Abdomen is soft.  Lymphadenopathy:     Cervical: No  cervical adenopathy.  Skin:    General: Skin is warm and dry.  Neurological:     General: No focal deficit present.     Mental Status: She is alert and oriented to person, place, and time.     Cranial Nerves: No cranial nerve deficit.     Motor: No weakness.      Diagnostic Tests: NUCLEAR  MEDICINE PET SKULL BASE TO THIGH   TECHNIQUE: 8.61 mCi F-18 FDG was injected intravenously. Full-ring PET imaging was performed from the skull base to thigh after the radiotracer. CT data was obtained and used for attenuation correction and anatomic localization.   Fasting blood glucose: 116 mg/dl   COMPARISON:  Cardiac CT 11/16/2021   FINDINGS: Mediastinal blood pool activity: SUV max 3.2   NECK:   No hypermetabolic cervical lymph nodes are identified.Symmetric activity within the lymphoid tissue of Waldeyer's ring is within physiologic limits. No suspicious activity identified within the pharyngeal mucosal space.   Incidental CT findings: none   CHEST:   The previously demonstrated soft tissue mass lateral to the main pulmonary artery measures 3.7 x 2.8 cm in demonstrates intermediate metabolic activity (SUV max 5.6). No other hypermetabolic mediastinal, hilar or axillary lymph nodes are identified. No hypermetabolic pulmonary activity or suspicious nodularity.   Incidental CT findings: Status post median sternotomy, AVR and CABG. Diffuse atherosclerosis of the aorta, great vessels and coronary arteries. Mild emphysematous changes.   ABDOMEN/PELVIS:   There is no hypermetabolic activity within the liver, adrenal glands, spleen or pancreas. There is no hypermetabolic nodal activity in the abdomen or pelvis. Bowel activity within physiologic limits.   Incidental CT findings: Status post cholecystectomy. Aortic and branch vessel atherosclerosis. There is a low-density left adnexal lesion measuring 4.9 x 3.1 cm on image 528/4, without metabolic activity.   SKELETON:    There is an intensely hypermetabolic lesion involving the T11 vertebral body, corresponding with a nonspecific lucent lesion anteriorly measuring 2.0 x 1.3 cm on image 93/4 (SUV max 9.2). No other suspicious osseous activity.   Incidental CT findings: Multilevel spondylosis. 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 Resulting biforaminal narrowing.   IMPRESSION: 1. The recently demonstrated left mediastinal mass demonstrates moderate hypermetabolic activity, suspicious for neoplasm. The differential remains broad and includes lymphadenopathy/lymphoma as well as atypical thymoma. Management options include tissue sampling and surveillance given the patient's age. 2. No other hypermetabolic lymph nodes in the neck, chest, abdomen or pelvis. 3. Indeterminate hypermetabolic lucent lesion in the T11 vertebral body which may be better characterized with MRI of the thoracic spine without and with contrast. 4. Cystic appearing left adnexal lesion without metabolic activity, most likely a benign cyst. Recommend follow-up US in 6-12 months. Note: This recommendation does not apply to premenarchal patients and to those with increased risk (genetic, family history, elevated tumor markers or other high-risk factors) of ovarian cancer. Reference: JACR 2020 Feb; 17(2):248-254     Electronically Signed   By: Richardean Sale M.D.   On: 11/28/2021 09:35 I personally reviewed the PET/CT images.  There is a 3.7 x 2.8 cm hypermetabolic mass in the anterior/middle mediastinum.  Differential is primarily thymoma versus lymphoma.  It is isolated to this one spot so lymphoma is not as likely.  Ectopic thyroid and teratoma are also in the differential but are less likely.  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    Anterior/ middle mediastinal mass-incidentally noted on a CT for coronary calcium screening.  Markedly hypermetabolic on PET/CT.  Differential is primarily thymoma versus lymphoma.  Not easily accessible for needle biopsy.  I recommended to Mrs. Berline Lopes and her son that we proceed with a robotic left VATS for resection of the mass for definitive  diagnosis of the mass and hopefully definitive treatment at the same setting.  If it is lymphoma, that might not be the case.  I described the proposed operative procedure to Mrs. Monsivais and her son.  I informed them of the general nature of the procedure including the need for general anesthesia, the incisions to be used, the use of the surgical robot, use of a drain postoperatively, the expected hospital stay, and the overall recovery.  I informed them of the indications, risks, benefits, and alternatives.  They understand the risks include, but not limited to death, MI, DVT, PE, bleeding, possible need for transfusion, infection, recurrent and/or phrenic nerve dysfunction, as well as possibility of other unforeseeable complications.  They understand the degree of pain is variable and unpredictable.  She accepts the risks and agrees to proceed.  Plan: Robotic assisted left VATS for resection of mediastinal mass  Melrose Nakayama, MD [facility] [phone]

## 2022-01-16 ENCOUNTER — Encounter: Payer: Self-pay | Admitting: *Deleted

## 2022-01-16 ENCOUNTER — Other Ambulatory Visit: Payer: Self-pay | Admitting: *Deleted

## 2022-01-16 DIAGNOSIS — J9859 Other diseases of mediastinum, not elsewhere classified: Secondary | ICD-10-CM

## 2022-01-23 NOTE — Progress Notes (Signed)
Surgical Instructions    Your procedure is scheduled on Monday October 23rd.  Report to [facility] Main Entrance "A" at 9:50 A.M., then check in with the Admitting office.  Call this number if you have problems the morning of surgery:  [phone]   If you have any questions prior to your surgery date call [phone]: Open Monday-Friday 8am-4pm If you experience any cold or flu symptoms such as cough, fever, chills, shortness of breath, etc. between now and your scheduled surgery, please notify us at the above number     Remember:  Do not eat or drink after midnight the night before your surgery     Take these medicines the morning of surgery with A SIP OF WATER: IF NEEDED  carboxymethylcellulose (REFRESH PLUS) 0.5 % SOLN     As of today, STOP taking any Aspirin (unless otherwise instructed by your surgeon) Aleve, Naproxen, Ibuprofen, Motrin, Advil, Goody's, BC's, all herbal medications, fish oil, and all vitamins.           Do not wear jewelry or makeup. Do not wear lotions, powders, perfumes or deodorant. Do not shave 48 hours prior to surgery.   Do not bring valuables to the hospital. Do not wear nail polish, gel polish, artificial nails, or any other type of covering on natural nails (fingers and toes) If you have artificial nails or gel coating that need to be removed by a nail salon, please have this removed prior to surgery. Artificial nails or gel coating may interfere with anesthesia's ability to adequately monitor your vital signs.  [facility] is not responsible for any belongings or valuables.    Do NOT Smoke (Tobacco/Vaping)  24 hours prior to your procedure  If you use a CPAP at night, you may bring your mask for your overnight stay.   Contacts, glasses, hearing aids, dentures or partials may not be worn into surgery, please bring cases for these belongings   For patients admitted to the hospital, discharge time will be determined by your treatment team.    Patients discharged the day of surgery will not be allowed to drive home, and someone needs to stay with them for 24 hours.   SURGICAL WAITING ROOM VISITATION Patients having surgery or a procedure may have no more than 2 support people in the waiting area - these visitors may rotate.   Children under the age of 47 must have an adult with them who is not the patient. If the patient needs to stay at the hospital during part of their recovery, the visitor guidelines for inpatient rooms apply. Pre-op nurse will coordinate an appropriate time for 1 support person to accompany patient in pre-op.  This support person may not rotate.   Please refer to [email or web address] for the visitor guidelines for Inpatients (after your surgery is over and you are in a regular room).    Special instructions:    Oral Hygiene is also important to reduce your risk of infection.  Remember - BRUSH YOUR TEETH THE MORNING OF SURGERY WITH YOUR REGULAR TOOTHPASTE   [facility]- Preparing For Surgery  Before surgery, you can play an important role. Because skin is not sterile, your skin needs to be as free of germs as possible. You can reduce the number of germs on your skin by washing with CHG (chlorahexidine gluconate) Soap before surgery.  CHG is an antiseptic cleaner which kills germs and bonds with the skin to continue killing germs even after washing.  Please do not use if you have an allergy to CHG or antibacterial soaps. If your skin becomes reddened/irritated stop using the CHG.  Do not shave (including legs and underarms) for at least 48 hours prior to first CHG shower. It is OK to shave your face.  Please follow these instructions carefully.     Shower the NIGHT BEFORE SURGERY and the MORNING OF SURGERY with CHG Soap.   If you chose to wash your hair, wash your hair first as usual with your normal shampoo. After you shampoo, rinse your hair and body  thoroughly to remove the shampoo.  Then [employer] and genitals (private parts) with your normal soap and rinse thoroughly to remove soap.  After that Use CHG Soap as you would any other liquid soap. You can apply CHG directly to the skin and wash gently with a scrungie or a clean washcloth.   Apply the CHG Soap to your body ONLY FROM THE NECK DOWN.  Do not use on open wounds or open sores. Avoid contact with your eyes, ears, mouth and genitals (private parts). Wash Face and genitals (private parts)  with your normal soap.   Wash thoroughly, paying special attention to the area where your surgery will be performed.  Thoroughly rinse your body with warm water from the neck down.  DO NOT shower/wash with your normal soap after using and rinsing off the CHG Soap.  Pat yourself dry with a CLEAN TOWEL.  Wear CLEAN PAJAMAS to bed the night before surgery  Place CLEAN SHEETS on your bed the night before your surgery  DO NOT SLEEP WITH PETS.   Day of Surgery:  Take a shower with CHG soap. Wear Clean/Comfortable clothing the morning of surgery Do not apply any deodorants/lotions.   Remember to brush your teeth WITH YOUR REGULAR TOOTHPASTE.    If you received a COVID test during your pre-op visit, it is requested that you wear a mask when out in public, stay away from anyone that may not be feeling well, and notify your surgeon if you develop symptoms. If you have been in contact with anyone that has tested positive in the last 10 days, please notify your surgeon.    Please read over the following fact sheets that you were given.

## 2022-01-24 ENCOUNTER — Encounter (HOSPITAL_COMMUNITY): Payer: Self-pay

## 2022-01-24 ENCOUNTER — Ambulatory Visit (HOSPITAL_COMMUNITY)
Admission: RE | Admit: 2022-01-24 | Discharge: 2022-01-24 | Disposition: A | Discharge status: Home / Self Care | Payer: Medicare PPO | Source: Ambulatory Visit | Attending: Thoracic Surgery (Cardiothoracic Vascular Surgery) | Admitting: Thoracic Surgery (Cardiothoracic Vascular Surgery)

## 2022-01-24 ENCOUNTER — Encounter (HOSPITAL_COMMUNITY)
Admission: RE | Admit: 2022-01-24 | Discharge: 2022-01-24 | Disposition: A | Discharge status: Home / Self Care | Payer: Medicare PPO | Source: Ambulatory Visit | Attending: Thoracic Surgery (Cardiothoracic Vascular Surgery) | Admitting: Thoracic Surgery (Cardiothoracic Vascular Surgery)

## 2022-01-24 VITALS — BP 150/70 | HR 55 | Temp 98.8°F | Resp 18

## 2022-01-24 DIAGNOSIS — Z1152 Encounter for screening for COVID-19: Secondary | ICD-10-CM | POA: Diagnosis not present

## 2022-01-24 DIAGNOSIS — I4510 Unspecified right bundle-branch block: Secondary | ICD-10-CM | POA: Diagnosis not present

## 2022-01-24 DIAGNOSIS — J9859 Other diseases of mediastinum, not elsewhere classified: Secondary | ICD-10-CM | POA: Diagnosis not present

## 2022-01-24 DIAGNOSIS — R001 Bradycardia, unspecified: Secondary | ICD-10-CM | POA: Diagnosis not present

## 2022-01-24 DIAGNOSIS — Z01818 Encounter for other preprocedural examination: Secondary | ICD-10-CM | POA: Diagnosis not present

## 2022-01-24 LAB — BLOOD GAS, ARTERIAL
Acid-Base Excess: 4.9 mmol/L — ABNORMAL HIGH (ref 0.0–2.0)
Bicarbonate: 29.2 mmol/L — ABNORMAL HIGH (ref 20.0–28.0)
Drawn by: 6643
O2 Saturation: 98.8 %
Patient temperature: 37
pCO2 arterial: 41 mmHg (ref 32–48)
pH, Arterial: 7.46 — ABNORMAL HIGH (ref 7.35–7.45)
pO2, Arterial: 88 mmHg (ref 83–108)

## 2022-01-24 LAB — COMPREHENSIVE METABOLIC PANEL
ALT: 21 U/L (ref 0–44)
AST: 21 U/L (ref 15–41)
Albumin: 3.8 g/dL (ref 3.5–5.0)
Alkaline Phosphatase: 68 U/L (ref 38–126)
Anion gap: 6 (ref 5–15)
BUN: 16 mg/dL (ref 8–23)
CO2: 26 mmol/L (ref 22–32)
Calcium: 9.3 mg/dL (ref 8.9–10.3)
Chloride: 107 mmol/L (ref 98–111)
Creatinine, Ser: 0.91 mg/dL (ref 0.44–1.00)
GFR, Estimated: >60 mL/min (ref >60)
Glucose, Bld: 99 mg/dL (ref 70–99)
Potassium: 3.9 mmol/L (ref 3.5–5.1)
Sodium: 139 mmol/L (ref 135–145)
Total Bilirubin: 0.5 mg/dL (ref 0.3–1.2)
Total Protein: 7.4 g/dL (ref 6.5–8.1)

## 2022-01-24 LAB — PROTIME-INR
INR: 1 (ref 0.8–1.2)
Prothrombin Time: 13.2 seconds (ref 11.4–15.2)

## 2022-01-24 LAB — URINALYSIS, ROUTINE W REFLEX MICROSCOPIC
Bilirubin Urine: NEGATIVE
Glucose, UA: NEGATIVE mg/dL
Hgb urine dipstick: NEGATIVE
Ketones, ur: NEGATIVE mg/dL
Leukocytes,Ua: NEGATIVE
Nitrite: NEGATIVE
Protein, ur: NEGATIVE mg/dL
Specific Gravity, Urine: 1.015 (ref 1.005–1.030)
pH: 5 (ref 5.0–8.0)

## 2022-01-24 LAB — CBC
HCT: 36.2 % (ref 36.0–46.0)
Hemoglobin: 12.2 g/dL (ref 12.0–15.0)
MCH: 33.4 pg (ref 26.0–34.0)
MCHC: 33.7 g/dL (ref 30.0–36.0)
MCV: 99.2 fL (ref 80.0–100.0)
Platelets: 232 10*3/uL (ref 150–400)
RBC: 3.65 MIL/uL — ABNORMAL LOW (ref 3.87–5.11)
RDW: 13.4 % (ref 11.5–15.5)
WBC: 8.6 10*3/uL (ref 4.0–10.5)
nRBC: 0 % (ref 0.0–0.2)

## 2022-01-24 LAB — TYPE AND SCREEN
ABO/RH(D): O POS
Antibody Screen: NEGATIVE

## 2022-01-24 LAB — SARS CORONAVIRUS 2 (TAT 6-24 HRS): SARS Coronavirus 2: NEGATIVE

## 2022-01-24 LAB — SURGICAL PCR SCREEN
MRSA, PCR: NEGATIVE
Staphylococcus aureus: NEGATIVE

## 2022-01-24 LAB — APTT: aPTT: 27 seconds (ref 24–36)

## 2022-01-24 NOTE — Progress Notes (Signed)
PCP - [facility]  Cardiologist - Dr.Traci Turner  PPM/ICD - denies Device Orders - n/a Rep Notified - n/a  Chest x-ray - 01/24/22 EKG - 01/24/22 Stress Test - denies ECHO - 10/10/21 Cardiac Cath - 09/16/2013  Sleep Study - denies CPAP - n/a  DM-pt denies Blood Thinner Instructions:denies Aspirin Instructions:denies  NPO after [place] COVID TEST- 01/24/22   Anesthesia review: YES, cardiac hx   Patient denies shortness of breath, fever, cough and chest pain at PAT appointment   All instructions explained to the patient, with a verbal understanding of the material. Patient agrees to go over the instructions while at home for a better understanding. Patient also instructed to self quarantine after being tested for COVID-19. The opportunity to ask questions was provided.

## 2022-01-25 NOTE — Progress Notes (Signed)
Anesthesia Chart Review:  Follows with cardiology for history of HTN, mildly dilated ascending aorta, HLD, PVCs, nonobstructive CAD, aortic stenosis s/p bioprosthetic AVR.  Echo 10/2021 showedEF 60 to 65%, grade 1 DD, mild pulmonary hypertension, severely dilated LA, mild MR, stable bioprosthetic AV with mean gradient 17 mmHg.  Coronary CT 11/2021 showed minimal nonobstructive CAD.  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  This was followed by PET scan which showed mass lateral to the main pulmonary artery measuring 3.7 x 2.8 cm with intermediate metabolic activity with SUV max of 5.6.  Patient was seen by oncology and subsequently referred to cardiothoracic surgery.  Preop Labs reviewed, unremarkable.  EKG 01/24/2022: Sinus bradycardia.  Rate 52. Incomplete right bundle branch block  PET scan 11/28/2021: IMPRESSION: 1. The recently demonstrated left mediastinal mass demonstrates moderate hypermetabolic activity, suspicious for neoplasm. The differential remains broad and includes lymphadenopathy/lymphoma as well as atypical thymoma. Management options include tissue sampling and surveillance given the patient's age. 2. No other hypermetabolic lymph nodes in the neck, chest, abdomen or pelvis. 3. Indeterminate hypermetabolic lucent lesion in the T11 vertebral body which may be better characterized with MRI of the thoracic spine without and with contrast. 4. Cystic appearing left adnexal lesion without metabolic activity, most likely a benign cyst. Recommend follow-up US in 6-12 months. Note: This recommendation does not apply to premenarchal patients and to those with increased risk (genetic, family history, elevated tumor markers or other high-risk factors) of ovarian cancer. Reference: JACR 2020 Feb; 17(2):248-254   Coronary CT 11/16/2021: IMPRESSION: 1. Coronary calcium score of 331. This was 107 percentile for age and sex matched control.   2. Normal coronary origin with  right dominance.   3. Scattered multivessel calcified plaque, 0-24% stenosis. No flow limiting disease (RoadMap analysis).   4.  Bioprosthetic aortic valve.   5.  Aortic atherosclerosis.   CAD-RADS 1. Minimal non-obstructive CAD (0-24%). Consider non-atherosclerotic causes of chest pain. Consider preventive therapy and risk factor modification.  TTE 10/10/2021:  1. Left ventricular ejection fraction, by estimation, is 60 to 65%. The  left ventricle has normal function. The left ventricle has no regional  wall motion abnormalities. There is mild left ventricular hypertrophy.  Left ventricular diastolic parameters  are consistent with Grade I diastolic dysfunction (impaired relaxation).   2. Right ventricular systolic function is normal. The right ventricular  size is moderately enlarged. There is mildly elevated pulmonary artery  systolic pressure. The estimated right ventricular systolic pressure is  82.5 mmHg.   3. Left atrial size was severely dilated.   4. Right atrial size was mildly dilated.   5. The mitral valve is normal in structure. Mild mitral valve  regurgitation. No evidence of mitral stenosis.   6. Bioprosthetic aortic valve. Mean gradient 17 mmHg with no significant  regurgitation noted.   7. Aortic dilatation noted. There is mild dilatation of the ascending  aorta, measuring 40 mm.   8. The inferior vena cava is normal in size with greater than 50%  respiratory variability, suggesting right atrial pressure of 3 mmHg.   3-day event monitor 10/01/2021: Predominant rhythm was sinus bradycardia with an average heartbeat of 50 bpm and ranged from 37 to 86 bpm 1 run of nonsustained ventricular tachycardia lasting 13 beats at a rate of 129 bpm 3 episodes of SVT with the fastest interval at 122 bpm and the longest interval lasting 10 beats Isolated PACs, PVCs, ventricular couplets, trigeminal and bigeminal PVCs. PVC load 2.6%  Wynonia Musty [facility] Short Stay  Center/Anesthesiology Phone [phone] 01/25/2022 8:55 AM

## 2022-01-25 NOTE — Anesthesia Preprocedure Evaluation (Signed)
Anesthesia Evaluation  Patient identified by MRN, date of birth, ID band Patient awake    Reviewed: Allergy & Precautions, H&P , NPO status , Patient's Chart, lab work & pertinent test results  Airway Mallampati: II   Neck ROM: full    Dental   Pulmonary neg pulmonary ROS,    breath sounds clear to auscultation       Cardiovascular hypertension, + Valvular Problems/Murmurs  Rhythm:regular Rate:Normal  S/p AVR in 2015.   TTE (10/2021): normal LV function. Prosthetic AV with normal function.   Neuro/Psych Anxiety    GI/Hepatic   Endo/Other    Renal/GU      Musculoskeletal   Abdominal   Peds  Hematology   Anesthesia Other Findings   Reproductive/Obstetrics                            Anesthesia Physical Anesthesia Plan  ASA: 3  Anesthesia Plan: General   Post-op Pain Management:    Induction: Intravenous  PONV Risk Score and Plan: 3 and Ondansetron, Dexamethasone, Midazolam and Treatment may vary due to age or medical condition  Airway Management Planned: Double Lumen EBT  Additional Equipment: Arterial line  Intra-op Plan:   Post-operative Plan: Extubation in OR  Informed Consent: I have reviewed the patients History and Physical, chart, labs and discussed the procedure including the risks, benefits and alternatives for the proposed anesthesia with the patient or authorized representative who has indicated his/her understanding and acceptance.     Dental advisory given  Plan Discussed with: CRNA, Anesthesiologist and Surgeon  Anesthesia Plan Comments: (PAT note by Karoline Caldwell, PA-C: Follows with cardiology for history of HTN, mildly dilated ascending aorta, HLD, PVCs, nonobstructive CAD, aortic stenosis s/p bioprosthetic AVR.  Echo 10/2021 showedEF 60 to 65%, grade 1 DD, mild pulmonary hypertension, severely dilated LA, mild MR, stable bioprosthetic AV with mean gradient 17 mmHg.   Coronary CT 11/2021 showed minimal nonobstructive CAD.  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  This was followed by PET scan which showed mass lateral to the main pulmonary artery measuring 3.7 x 2.8 cm with intermediate metabolic activity with SUV max of 5.6.  Patient was seen by oncology and subsequently referred to cardiothoracic surgery.  Preop Labs reviewed, unremarkable.  EKG 01/24/2022: Sinus bradycardia.  Rate 52. Incomplete right bundle branch block  PET scan 11/28/2021: IMPRESSION: 1. The recently demonstrated left mediastinal mass demonstrates moderate hypermetabolic activity, suspicious for neoplasm. The differential remains broad and includes lymphadenopathy/lymphoma as well as atypical thymoma. Management options include tissue sampling and surveillance given the patient's age. 2. No other hypermetabolic lymph nodes in the neck, chest, abdomen or pelvis. 3. Indeterminate hypermetabolic lucent lesion in the T11 vertebral body which may be better characterized with MRI of the thoracic spine without and with contrast. 4. Cystic appearing left adnexal lesion without metabolic activity, most likely a benign cyst. Recommend follow-up US in 6-12 months. Note: This recommendation does not apply to premenarchal patients and to those with increased risk (genetic, family history, elevated tumor markers or other high-risk factors) of ovarian cancer. Reference: JACR 2020 Feb; 17(2):248-254  Coronary CT 11/16/2021: IMPRESSION: 1. Coronary calcium score of 331. This was 76 percentile for age and sex matched control.  2. Normal coronary origin with right dominance.  3. Scattered multivessel calcified plaque, 0-24% stenosis. No flow limiting disease (RoadMap analysis).  4. Bioprosthetic aortic valve.  5. Aortic atherosclerosis.  CAD-RADS 1. Minimal non-obstructive  CAD (0-24%). Consider non-atherosclerotic causes of chest pain. Consider preventive therapy and  risk factor modification.  TTE 10/10/2021: 1. Left ventricular ejection fraction, by estimation, is 60 to 65%. The  left ventricle has normal function. The left ventricle has no regional  wall motion abnormalities. There is mild left ventricular hypertrophy.  Left ventricular diastolic parameters  are consistent with Grade I diastolic dysfunction (impaired relaxation).  2. Right ventricular systolic function is normal. The right ventricular  size is moderately enlarged. There is mildly elevated pulmonary artery  systolic pressure. The estimated right ventricular systolic pressure is  83.3 mmHg.  3. Left atrial size was severely dilated.  4. Right atrial size was mildly dilated.  5. The mitral valve is normal in structure. Mild mitral valve  regurgitation. No evidence of mitral stenosis.  6. Bioprosthetic aortic valve. Mean gradient 17 mmHg with no significant  regurgitation noted.  7. Aortic dilatation noted. There is mild dilatation of the ascending  aorta, measuring 40 mm.  8. The inferior vena cava is normal in size with greater than 50%  respiratory variability, suggesting right atrial pressure of 3 mmHg.   3-day event monitor 10/01/2021: . Predominant rhythm was sinus bradycardia with an average heartbeat of 50 bpm and ranged from 37 to 86 bpm . 1 run of nonsustained ventricular tachycardia lasting 13 beats at a rate of 129 bpm . 3 episodes of SVT with the fastest interval at 122 bpm and the longest interval lasting 10 beats . Isolated PACs, PVCs, ventricular couplets, trigeminal and bigeminal PVCs. PVC load 2.6%  )       Anesthesia Quick Evaluation

## 2022-01-28 ENCOUNTER — Encounter (HOSPITAL_COMMUNITY)
Admission: RE | Disposition: A | Discharge status: Home / Self Care | Payer: Self-pay | Source: Ambulatory Visit | Attending: Thoracic Surgery (Cardiothoracic Vascular Surgery)

## 2022-01-28 ENCOUNTER — Inpatient Hospital Stay (HOSPITAL_COMMUNITY): Payer: Medicare PPO | Admitting: Physician Assistant

## 2022-01-28 ENCOUNTER — Inpatient Hospital Stay (HOSPITAL_COMMUNITY): Payer: Medicare PPO | Admitting: Anesthesiology

## 2022-01-28 ENCOUNTER — Other Ambulatory Visit: Payer: Self-pay

## 2022-01-28 ENCOUNTER — Inpatient Hospital Stay (HOSPITAL_COMMUNITY)
Admission: RE | Admit: 2022-01-28 | Discharge: 2022-01-29 | DRG: 828 | Disposition: A | Discharge status: Home / Self Care | Payer: Medicare PPO | Source: Ambulatory Visit | Attending: Thoracic Surgery (Cardiothoracic Vascular Surgery) | Admitting: Thoracic Surgery (Cardiothoracic Vascular Surgery)

## 2022-01-28 ENCOUNTER — Inpatient Hospital Stay (HOSPITAL_COMMUNITY): Payer: Medicare PPO

## 2022-01-28 DIAGNOSIS — I10 Essential (primary) hypertension: Secondary | ICD-10-CM | POA: Diagnosis not present

## 2022-01-28 DIAGNOSIS — I454 Nonspecific intraventricular block: Secondary | ICD-10-CM | POA: Diagnosis present

## 2022-01-28 DIAGNOSIS — I77810 Thoracic aortic ectasia: Secondary | ICD-10-CM | POA: Diagnosis present

## 2022-01-28 DIAGNOSIS — R222 Localized swelling, mass and lump, trunk: Secondary | ICD-10-CM | POA: Diagnosis not present

## 2022-01-28 DIAGNOSIS — E7800 Pure hypercholesterolemia, unspecified: Secondary | ICD-10-CM | POA: Diagnosis present

## 2022-01-28 DIAGNOSIS — J939 Pneumothorax, unspecified: Secondary | ICD-10-CM | POA: Diagnosis not present

## 2022-01-28 DIAGNOSIS — Z885 Allergy status to narcotic agent status: Secondary | ICD-10-CM | POA: Diagnosis not present

## 2022-01-28 DIAGNOSIS — Z23 Encounter for immunization: Secondary | ICD-10-CM

## 2022-01-28 DIAGNOSIS — Z953 Presence of xenogenic heart valve: Secondary | ICD-10-CM | POA: Diagnosis not present

## 2022-01-28 DIAGNOSIS — Z880 Allergy status to penicillin: Secondary | ICD-10-CM | POA: Diagnosis not present

## 2022-01-28 DIAGNOSIS — M8580 Other specified disorders of bone density and structure, unspecified site: Secondary | ICD-10-CM | POA: Diagnosis present

## 2022-01-28 DIAGNOSIS — Z951 Presence of aortocoronary bypass graft: Secondary | ICD-10-CM | POA: Diagnosis not present

## 2022-01-28 DIAGNOSIS — Z882 Allergy status to sulfonamides status: Secondary | ICD-10-CM | POA: Diagnosis not present

## 2022-01-28 DIAGNOSIS — I272 Pulmonary hypertension, unspecified: Secondary | ICD-10-CM | POA: Diagnosis present

## 2022-01-28 DIAGNOSIS — I2510 Atherosclerotic heart disease of native coronary artery without angina pectoris: Secondary | ICD-10-CM | POA: Diagnosis present

## 2022-01-28 DIAGNOSIS — Z6831 Body mass index (BMI) 31.0-31.9, adult: Secondary | ICD-10-CM

## 2022-01-28 DIAGNOSIS — Z79899 Other long term (current) drug therapy: Secondary | ICD-10-CM | POA: Diagnosis not present

## 2022-01-28 DIAGNOSIS — J9811 Atelectasis: Secondary | ICD-10-CM | POA: Diagnosis not present

## 2022-01-28 DIAGNOSIS — M19019 Primary osteoarthritis, unspecified shoulder: Secondary | ICD-10-CM | POA: Diagnosis not present

## 2022-01-28 DIAGNOSIS — E669 Obesity, unspecified: Secondary | ICD-10-CM | POA: Diagnosis present

## 2022-01-28 DIAGNOSIS — Z9889 Other specified postprocedural states: Secondary | ICD-10-CM

## 2022-01-28 DIAGNOSIS — J9859 Other diseases of mediastinum, not elsewhere classified: Principal | ICD-10-CM | POA: Diagnosis present

## 2022-01-28 DIAGNOSIS — J90 Pleural effusion, not elsewhere classified: Secondary | ICD-10-CM | POA: Diagnosis not present

## 2022-01-28 DIAGNOSIS — Z8249 Family history of ischemic heart disease and other diseases of the circulatory system: Secondary | ICD-10-CM

## 2022-01-28 DIAGNOSIS — Z9049 Acquired absence of other specified parts of digestive tract: Secondary | ICD-10-CM | POA: Diagnosis not present

## 2022-01-28 DIAGNOSIS — C37 Malignant neoplasm of thymus: Secondary | ICD-10-CM | POA: Diagnosis not present

## 2022-01-28 DIAGNOSIS — Z888 Allergy status to other drugs, medicaments and biological substances status: Secondary | ICD-10-CM | POA: Diagnosis not present

## 2022-01-28 DIAGNOSIS — F419 Anxiety disorder, unspecified: Secondary | ICD-10-CM

## 2022-01-28 SURGERY — EXCISION, MASS, MEDIASTINUM, ROBOT-ASSISTED
Anesthesia: General | Site: Chest | Laterality: Left

## 2022-01-28 MED ORDER — PANTOPRAZOLE SODIUM 40 MG PO TBEC
40.0000 mg | DELAYED_RELEASE_TABLET | Freq: Every day | ORAL | Status: DC
  Administered 2022-01-29: 40 mg via ORAL
  Filled 2022-01-28: qty 1

## 2022-01-28 MED ORDER — MORPHINE SULFATE (PF) 2 MG/ML IV SOLN: 2.0000 mg | INTRAVENOUS | Status: DC | PRN

## 2022-01-28 MED ORDER — LACTATED RINGERS IV SOLN: INTRAVENOUS | Status: DC

## 2022-01-28 MED ORDER — ROCURONIUM BROMIDE 10 MG/ML (PF) SYRINGE
PREFILLED_SYRINGE | INTRAVENOUS | Status: DC | PRN
  Administered 2022-01-28 (×2): 50 mg via INTRAVENOUS

## 2022-01-28 MED ORDER — HYDROMORPHONE HCL 1 MG/ML IJ SOLN
INTRAMUSCULAR | Status: DC | PRN
  Administered 2022-01-28 (×2): .5 mg via INTRAVENOUS

## 2022-01-28 MED ORDER — OXYCODONE HCL 5 MG PO TABS
5.0000 mg | ORAL_TABLET | Freq: Once | ORAL | Status: AC | PRN
  Administered 2022-01-28: 5 mg via ORAL

## 2022-01-28 MED ORDER — BUPIVACAINE LIPOSOME 1.3 % IJ SUSP
INTRAMUSCULAR | Status: AC
  Filled 2022-01-28: qty 20

## 2022-01-28 MED ORDER — CHLORHEXIDINE GLUCONATE 0.12 % MT SOLN: 15.0000 mL | Freq: Once | OROMUCOSAL | Status: AC

## 2022-01-28 MED ORDER — LIDOCAINE 2% (20 MG/ML) 5 ML SYRINGE
INTRAMUSCULAR | Status: AC
  Filled 2022-01-28: qty 5

## 2022-01-28 MED ORDER — ENOXAPARIN SODIUM 40 MG/0.4ML IJ SOSY
40.0000 mg | PREFILLED_SYRINGE | Freq: Every day | INTRAMUSCULAR | Status: DC
  Administered 2022-01-29: 40 mg via SUBCUTANEOUS
  Filled 2022-01-28: qty 0.4

## 2022-01-28 MED ORDER — FENTANYL CITRATE (PF) 250 MCG/5ML IJ SOLN
INTRAMUSCULAR | Status: DC | PRN
  Administered 2022-01-28 (×5): 50 ug via INTRAVENOUS

## 2022-01-28 MED ORDER — POLYVINYL ALCOHOL 1.4 % OP SOLN: 1.0000 [drp] | OPHTHALMIC | Status: DC | PRN

## 2022-01-28 MED ORDER — FENTANYL CITRATE (PF) 250 MCG/5ML IJ SOLN
INTRAMUSCULAR | Status: AC
  Filled 2022-01-28: qty 5

## 2022-01-28 MED ORDER — HYDROCHLOROTHIAZIDE 12.5 MG PO TABS
12.5000 mg | ORAL_TABLET | Freq: Every day | ORAL | Status: DC
  Administered 2022-01-29: 12.5 mg via ORAL
  Filled 2022-01-28 (×2): qty 1

## 2022-01-28 MED ORDER — SODIUM CHLORIDE FLUSH 0.9 % IV SOLN
INTRAVENOUS | Status: DC | PRN
  Administered 2022-01-28: 20 mL

## 2022-01-28 MED ORDER — ORAL CARE MOUTH RINSE: 15.0000 mL | Freq: Once | OROMUCOSAL | Status: AC

## 2022-01-28 MED ORDER — BISACODYL 5 MG PO TBEC
10.0000 mg | DELAYED_RELEASE_TABLET | Freq: Every day | ORAL | Status: DC
  Administered 2022-01-28: 10 mg via ORAL
  Filled 2022-01-28: qty 2

## 2022-01-28 MED ORDER — KETOROLAC TROMETHAMINE 15 MG/ML IJ SOLN
INTRAMUSCULAR | Status: AC
  Filled 2022-01-28: qty 1

## 2022-01-28 MED ORDER — DEXAMETHASONE SODIUM PHOSPHATE 10 MG/ML IJ SOLN
INTRAMUSCULAR | Status: AC
  Filled 2022-01-28: qty 1

## 2022-01-28 MED ORDER — KETOROLAC TROMETHAMINE 15 MG/ML IJ SOLN
15.0000 mg | Freq: Four times a day (QID) | INTRAMUSCULAR | Status: DC
  Administered 2022-01-28 – 2022-01-29 (×3): 15 mg via INTRAVENOUS
  Filled 2022-01-28 (×2): qty 1

## 2022-01-28 MED ORDER — CEFAZOLIN SODIUM-DEXTROSE 2-4 GM/100ML-% IV SOLN
2.0000 g | Freq: Three times a day (TID) | INTRAVENOUS | Status: AC
  Administered 2022-01-28 – 2022-01-29 (×2): 2 g via INTRAVENOUS
  Filled 2022-01-28 (×2): qty 100

## 2022-01-28 MED ORDER — ROCURONIUM BROMIDE 10 MG/ML (PF) SYRINGE
PREFILLED_SYRINGE | INTRAVENOUS | Status: AC
  Filled 2022-01-28: qty 10

## 2022-01-28 MED ORDER — ONDANSETRON HCL 4 MG/2ML IJ SOLN
INTRAMUSCULAR | Status: AC
  Filled 2022-01-28: qty 2

## 2022-01-28 MED ORDER — VANCOMYCIN HCL IN DEXTROSE 1-5 GM/200ML-% IV SOLN
INTRAVENOUS | Status: AC
  Administered 2022-01-28: 1000 mg via INTRAVENOUS
  Filled 2022-01-28: qty 200

## 2022-01-28 MED ORDER — ONDANSETRON HCL 4 MG/2ML IJ SOLN: 4.0000 mg | Freq: Four times a day (QID) | INTRAMUSCULAR | Status: DC | PRN

## 2022-01-28 MED ORDER — OXYCODONE HCL 5 MG PO TABS
ORAL_TABLET | ORAL | Status: AC
  Filled 2022-01-28: qty 1

## 2022-01-28 MED ORDER — EPHEDRINE SULFATE-NACL 50-0.9 MG/10ML-% IV SOSY
PREFILLED_SYRINGE | INTRAVENOUS | Status: DC | PRN
  Administered 2022-01-28 (×2): 10 mg via INTRAVENOUS

## 2022-01-28 MED ORDER — PROPOFOL 10 MG/ML IV BOLUS
INTRAVENOUS | Status: DC | PRN
  Administered 2022-01-28: 200 mg via INTRAVENOUS

## 2022-01-28 MED ORDER — VANCOMYCIN HCL IN DEXTROSE 1-5 GM/200ML-% IV SOLN: 1000.0000 mg | INTRAVENOUS | Status: AC

## 2022-01-28 MED ORDER — FENTANYL CITRATE (PF) 100 MCG/2ML IJ SOLN: 25.0000 ug | INTRAMUSCULAR | Status: DC | PRN

## 2022-01-28 MED ORDER — INFLUENZA VAC A&B SA ADJ QUAD 0.5 ML IM PRSY
0.5000 mL | PREFILLED_SYRINGE | INTRAMUSCULAR | Status: AC
  Administered 2022-01-29: 0.5 mL via INTRAMUSCULAR
  Filled 2022-01-28: qty 0.5

## 2022-01-28 MED ORDER — DEXAMETHASONE SODIUM PHOSPHATE 10 MG/ML IJ SOLN
INTRAMUSCULAR | Status: DC | PRN
  Administered 2022-01-28: 10 mg via INTRAVENOUS

## 2022-01-28 MED ORDER — LISINOPRIL 10 MG PO TABS
10.0000 mg | ORAL_TABLET | Freq: Every day | ORAL | Status: DC
  Administered 2022-01-29: 10 mg via ORAL
  Filled 2022-01-28: qty 1

## 2022-01-28 MED ORDER — SENNOSIDES-DOCUSATE SODIUM 8.6-50 MG PO TABS
1.0000 | ORAL_TABLET | Freq: Every day | ORAL | Status: DC
  Administered 2022-01-28: 1 via ORAL
  Filled 2022-01-28: qty 1

## 2022-01-28 MED ORDER — LIDOCAINE 2% (20 MG/ML) 5 ML SYRINGE
INTRAMUSCULAR | Status: DC | PRN
  Administered 2022-01-28: 80 mg via INTRAVENOUS

## 2022-01-28 MED ORDER — PROPOFOL 10 MG/ML IV BOLUS
INTRAVENOUS | Status: AC
  Filled 2022-01-28: qty 20

## 2022-01-28 MED ORDER — ACETAMINOPHEN 160 MG/5ML PO SOLN: 1000.0000 mg | Freq: Four times a day (QID) | ORAL | Status: DC

## 2022-01-28 MED ORDER — OXYCODONE HCL 5 MG/5ML PO SOLN: 5.0000 mg | Freq: Once | ORAL | Status: AC | PRN

## 2022-01-28 MED ORDER — SODIUM CHLORIDE 0.45 % IV SOLN: INTRAVENOUS | Status: DC

## 2022-01-28 MED ORDER — SUGAMMADEX SODIUM 200 MG/2ML IV SOLN
INTRAVENOUS | Status: DC | PRN
  Administered 2022-01-28: 200 mg via INTRAVENOUS

## 2022-01-28 MED ORDER — ONDANSETRON HCL 4 MG/2ML IJ SOLN
4.0000 mg | Freq: Four times a day (QID) | INTRAMUSCULAR | Status: AC | PRN
  Administered 2022-01-28: 4 mg via INTRAVENOUS

## 2022-01-28 MED ORDER — SODIUM CHLORIDE 0.9 % IV SOLN
INTRAVENOUS | Status: AC | PRN
  Administered 2022-01-28: 1000 mL

## 2022-01-28 MED ORDER — EPHEDRINE 5 MG/ML INJ
INTRAVENOUS | Status: AC
  Filled 2022-01-28: qty 5

## 2022-01-28 MED ORDER — HYDROMORPHONE HCL 1 MG/ML IJ SOLN
INTRAMUSCULAR | Status: AC
  Filled 2022-01-28: qty 0.5

## 2022-01-28 MED ORDER — TRAMADOL HCL 50 MG PO TABS
ORAL_TABLET | ORAL | Status: AC
  Filled 2022-01-28: qty 2

## 2022-01-28 MED ORDER — ACETAMINOPHEN 500 MG PO TABS
1000.0000 mg | ORAL_TABLET | Freq: Four times a day (QID) | ORAL | Status: DC
  Administered 2022-01-28 – 2022-01-29 (×3): 1000 mg via ORAL
  Filled 2022-01-28 (×3): qty 2

## 2022-01-28 MED ORDER — OXYCODONE HCL 5 MG PO TABS: 5.0000 mg | ORAL_TABLET | ORAL | Status: DC | PRN

## 2022-01-28 MED ORDER — BUPIVACAINE HCL (PF) 0.5 % IJ SOLN
INTRAMUSCULAR | Status: AC
  Filled 2022-01-28: qty 30

## 2022-01-28 MED ORDER — ONDANSETRON HCL 4 MG/2ML IJ SOLN
INTRAMUSCULAR | Status: DC | PRN
  Administered 2022-01-28: 4 mg via INTRAVENOUS

## 2022-01-28 MED ORDER — CHLORHEXIDINE GLUCONATE 0.12 % MT SOLN
OROMUCOSAL | Status: AC
  Administered 2022-01-28: 15 mL via OROMUCOSAL
  Filled 2022-01-28: qty 15

## 2022-01-28 MED ORDER — CARBOXYMETHYLCELLULOSE SODIUM 0.5 % OP SOLN: 1.0000 [drp] | Freq: Every day | OPHTHALMIC | Status: DC | PRN

## 2022-01-28 MED ORDER — TRAMADOL HCL 50 MG PO TABS
50.0000 mg | ORAL_TABLET | Freq: Four times a day (QID) | ORAL | Status: DC | PRN
  Administered 2022-01-28: 50 mg via ORAL

## 2022-01-28 SURGICAL SUPPLY — 67 items
ADH SKN CLS APL DERMABOND .7 (GAUZE/BANDAGES/DRESSINGS) ×1
BAG SPEC RTRVL C125 8X14 (MISCELLANEOUS) ×1
BLADE STERNUM SYSTEM 6 (BLADE) IMPLANT
CANNULA REDUC XI 12-8 STAPL (CANNULA) ×1
CANNULA REDUCER 12-8 DVNC XI (CANNULA) ×1 IMPLANT
CONN ST 1/4X3/8  BEN (MISCELLANEOUS) ×1
CONN ST 1/4X3/8 BEN (MISCELLANEOUS) IMPLANT
DEFOGGER SCOPE WARMER CLEARIFY (MISCELLANEOUS) ×1 IMPLANT
DERMABOND ADVANCED .7 DNX12 (GAUZE/BANDAGES/DRESSINGS) IMPLANT
DRAIN CHANNEL 28F RND 3/8 FF (WOUND CARE) IMPLANT
DRAPE ARM DVNC X/XI (DISPOSABLE) ×4 IMPLANT
DRAPE COLUMN DVNC XI (DISPOSABLE) ×1 IMPLANT
DRAPE CV SPLIT W-CLR ANES SCRN (DRAPES) ×1 IMPLANT
DRAPE DA VINCI XI ARM (DISPOSABLE) ×4
DRAPE DA VINCI XI COLUMN (DISPOSABLE) ×1
DRAPE HALF SHEET 40X57 (DRAPES) ×2 IMPLANT
DRAPE INCISE IOBAN 66X45 STRL (DRAPES) IMPLANT
DRAPE ORTHO SPLIT 77X108 STRL (DRAPES) ×1
DRAPE SURG ORHT 6 SPLT 77X108 (DRAPES) ×1 IMPLANT
ELECT REM PT RETURN 9FT ADLT (ELECTROSURGICAL) ×1
ELECTRODE REM PT RTRN 9FT ADLT (ELECTROSURGICAL) ×1 IMPLANT
FELT TEFLON 1X6 (MISCELLANEOUS) IMPLANT
GAUZE 4X4 16PLY ~~LOC~~+RFID DBL (SPONGE) IMPLANT
GAUZE KITTNER 4X8 (MISCELLANEOUS) ×1 IMPLANT
GAUZE SPONGE 4X4 12PLY STRL (GAUZE/BANDAGES/DRESSINGS) IMPLANT
GLOVE SS BIOGEL STRL SZ 7.5 (GLOVE) ×1 IMPLANT
GOWN STRL REUS W/ TWL LRG LVL3 (GOWN DISPOSABLE) ×1 IMPLANT
GOWN STRL REUS W/ TWL XL LVL3 (GOWN DISPOSABLE) ×1 IMPLANT
GOWN STRL REUS W/TWL 2XL LVL3 (GOWN DISPOSABLE) ×1 IMPLANT
GOWN STRL REUS W/TWL LRG LVL3 (GOWN DISPOSABLE) ×1
GOWN STRL REUS W/TWL XL LVL3 (GOWN DISPOSABLE) ×1
HEMOSTAT SURGICEL 2X14 (HEMOSTASIS) ×2 IMPLANT
IRRIGATION STRYKERFLOW (MISCELLANEOUS) ×1 IMPLANT
IRRIGATOR STRYKERFLOW (MISCELLANEOUS) ×1
IV SODIUM CHL 0.9% 500ML (IV SOLUTION) ×1 IMPLANT
KIT SUCTION CATH 14FR (SUCTIONS) IMPLANT
NDL 25GX 5/8IN NON SAFETY (NEEDLE) ×1 IMPLANT
NEEDLE 25GX 5/8IN NON SAFETY (NEEDLE) ×1 IMPLANT
PACK CHEST (CUSTOM PROCEDURE TRAY) ×1 IMPLANT
PAD ARMBOARD 7.5X6 YLW CONV (MISCELLANEOUS) ×2 IMPLANT
PAD ELECT DEFIB RADIOL ZOLL (MISCELLANEOUS) IMPLANT
SEAL CANN UNIV 5-8 DVNC XI (MISCELLANEOUS) ×3 IMPLANT
SEAL XI 5MM-8MM UNIVERSAL (MISCELLANEOUS) ×3
SEALER SYNCHRO 8 IS4000 DV (MISCELLANEOUS) ×1
SEALER SYNCHRO 8 IS4000 DVNC (MISCELLANEOUS) IMPLANT
SET TRI-LUMEN FLTR TB AIRSEAL (TUBING) ×1 IMPLANT
SPONGE T-LAP 18X18 ~~LOC~~+RFID (SPONGE) ×1 IMPLANT
SPONGE T-LAP 4X18 ~~LOC~~+RFID (SPONGE) IMPLANT
SPONGE TONSIL 1 RF SGL (DISPOSABLE) ×1 IMPLANT
STAPLER CANNULA SEAL DVNC XI (STAPLE) IMPLANT
STAPLER CANNULA SEAL XI (STAPLE)
SUT SILK 2 0 SH CR/8 (SUTURE) IMPLANT
SUT STEEL 6MS V (SUTURE) IMPLANT
SUT STEEL SZ 6 DBL 3X14 BALL (SUTURE) IMPLANT
SUT VIC AB 1 CTX 36 (SUTURE) ×1
SUT VIC AB 1 CTX36XBRD ANBCTR (SUTURE) ×1 IMPLANT
SUT VIC AB 2-0 CTX 36 (SUTURE) ×1 IMPLANT
SUT VIC AB 3-0 X1 27 (SUTURE) ×2 IMPLANT
SUT VICRYL 0 UR6 27IN ABS (SUTURE) ×2 IMPLANT
SYR 20CC LL (SYRINGE) ×2 IMPLANT
SYSTEM RETRIEVAL ANCHOR 8 (MISCELLANEOUS) IMPLANT
SYSTEM SAHARA CHEST DRAIN ATS (WOUND CARE) IMPLANT
TAPE CLOTH SURG 4X10 WHT LF (GAUZE/BANDAGES/DRESSINGS) IMPLANT
TOWEL GREEN STERILE (TOWEL DISPOSABLE) IMPLANT
TOWEL GREEN STERILE FF (TOWEL DISPOSABLE) IMPLANT
TRAY FOLEY SLVR 16FR TEMP STAT (SET/KITS/TRAYS/PACK) ×1 IMPLANT
TROCAR PORT AIRSEAL 12X150 (TUBING) IMPLANT

## 2022-01-28 NOTE — Hospital Course (Addendum)
History of Present Illness:  [name] is sent for consultation re: a mediastinal mass   [name] is an 81 year-old woman with a history of aortic stenosis, s/p AVR, CAD, ascending aneurysm, bradycardia, hypertension, hyperlipidemia, mild to moderate pulmonary hypertension, pneumonia, mild ECCOD, obesity and osteopenia.     Recently had a coronary CT. It showed mild CAD and also an incompletely visualized mediastinal mass.  A PET CT showed the mass was 3.7 x 2.8 cm and was hypermetabolic with an SUV of 5.6.   She denies chest pain, shortness of breath, fever, night sweats, weakness, trouble swallowing and double vision.  Hospital Course: Patient presented to [facility] and was brought to the operating room on 01/28/22. She underwent a robotic assisted anterior mediastinal mass resection. She tolerated the procedure well and was brought to the PACU in stable condition.  The patient has done well post operatively.  Her drain output was low at 100cc and was removed without difficulty.  Follow up CXR showed no pneumothorax and trace left pleural effusion, bilateral atelectasis.  She had a mild elevation in her creatinine due to Toradol use and this was discontinued.  She has been resumed on her home antihypertensive regimen.  She is maintaining NSR.  Her surgical incisions are healing without evidence of infection.  She is medically stable for discharge home today.

## 2022-01-28 NOTE — Anesthesia Procedure Notes (Signed)
Procedure Name: Intubation Date/Time: 01/28/2022 1:01 PM  Performed by: Lance Coon, CRNAPre-anesthesia Checklist: Patient identified, Emergency Drugs available, Suction available, Patient being monitored and Timeout performed Patient Re-evaluated:Patient Re-evaluated prior to induction Oxygen Delivery Method: Circle system utilized Preoxygenation: Pre-oxygenation with 100% oxygen Induction Type: IV induction Ventilation: Mask ventilation without difficulty Laryngoscope Size: Miller and 3 Grade View: Grade I Endobronchial tube: Left, Double lumen EBT, EBT position confirmed by fiberoptic bronchoscope and EBT position confirmed by auscultation and 37 Fr Number of attempts: 1 Airway Equipment and Method: Stylet and Fiberoptic brochoscope Placement Confirmation: ETT inserted through vocal cords under direct vision, positive ETCO2 and breath sounds checked- equal and bilateral Tube secured with: Tape Dental Injury: Teeth and Oropharynx as per pre-operative assessment

## 2022-01-28 NOTE — Transfer of Care (Signed)
Immediate Anesthesia Transfer of Care Note  Patient: [name]  Procedure(s) Performed: XI ROBOTIC ASSISTED RESECTION OF MEDIASTINAL MASS (Left: Chest)  Patient Location: PACU  Anesthesia Type:General  Level of Consciousness: awake, alert  and oriented  Airway & Oxygen Therapy: Patient Spontanous Breathing and Patient connected to face mask oxygen  Post-op Assessment: Report given to RN, Post -op Vital signs reviewed and stable and Patient moving all extremities X 4  Post vital signs: Reviewed and stable  Last Vitals:  Vitals Value Taken Time  BP 151/56 01/28/22 1605  Temp 36.4 C 01/28/22 1605  Pulse 71 01/28/22 1607  Resp 14 01/28/22 1607  SpO2 97 % 01/28/22 1607  Vitals shown include unvalidated device data.  Last Pain:  Vitals:   01/28/22 1035  TempSrc:   PainSc: 0-No pain         Complications: No notable events documented.

## 2022-01-28 NOTE — Interval H&P Note (Signed)
History and Physical Interval Note:  01/28/2022 12:16 PM  [name]  has presented today for surgery, with the diagnosis of ANTERIOR MEDIASTINAL MASS.  The various methods of treatment have been discussed with the patient and family. After consideration of risks, benefits and other options for treatment, the patient has consented to  Procedure(s): XI ROBOTIC ASSISTED RESECTION OF MEDIASTINAL MASS (Left) as a surgical intervention.  The patient's history has been reviewed, patient examined, no change in status, stable for surgery.  I have reviewed the patient's chart and labs.  Questions were answered to the patient's satisfaction.     Melrose Nakayama

## 2022-01-28 NOTE — Anesthesia Procedure Notes (Signed)
Arterial Line Insertion Start/End10/23/2023 10:20 AM, 01/28/2022 10:25 AM Performed by: Kyung Rudd, CRNA, CRNA  Preanesthetic checklist: patient identified, IV checked, site marked, risks and benefits discussed, surgical consent, monitors and equipment checked, pre-op evaluation, timeout performed and anesthesia consent Lidocaine 1% used for infiltration Right, radial was placed Catheter size: 20 G Hand hygiene performed  and maximum sterile barriers used   Attempts: 1 Procedure performed without using ultrasound guided technique. Following insertion, Biopatch and dressing applied. Patient tolerated the procedure well with no immediate complications.

## 2022-01-28 NOTE — Progress Notes (Signed)
Hendrickson MD made aware pt chest x-ray shows small pneumothorax.

## 2022-01-28 NOTE — Op Note (Signed)
NAMEANITRIA, ANDON MEDICAL RECORD NO: [id number] ACCOUNT NO: [id number] DATE OF BIRTH: May 04, 1940 FACILITY: [facility] LOCATION: [id number] PHYSICIAN: Revonda Standard. Roxan Hockey, MD  Operative Report   DATE OF PROCEDURE: 01/28/2022  PREOPERATIVE DIAGNOSIS:  Anterior mediastinal mass.  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  PROCEDURE:  Robotic left video-assisted thoracoscopy for resection of anterior mediastinal mass.  SURGEON:  Revonda Standard. Roxan Hockey, MD  ASSISTANT:  Wynelle Beckmann, PA  ANESTHESIA:  General.  FINDINGS:  Approximately a 3.5 cm mass in left anterior mediastinum close proximity to the phrenic nerve.  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  CLINICAL NOTE:  [name] is an 81 year old woman who was recently found to have an anterior mediastinal mass, incidentally noted on a CT for coronary calcium screening.  On PET/CT, the mass was markedly hypermetabolic with a differential diagnosis of thymoma  versus lymphoma.  She was advised to undergo a resection for definitive diagnosis and hopefully definitive treatment as well depending on the findings.  The indications, risks, benefits, and alternatives were discussed in detail with the [name]  She  understood and accepted the risks and agreed to proceed.  OPERATIVE NOTE:  [name] was brought to the preoperative holding area on 01/28/2022.  Anesthesia established intravenous access and placed an arterial blood pressure monitoring line.  She was taken to the operating room and anesthetized and  intubated with a double lumen endotracheal tube.  Intravenous antibiotics were administered.  Sequential compression devices were placed on the calves for DVT prophylaxis.  A Foley catheter was placed.  She was left in a supine position and a roll was  placed under the left chest and elevate the left chest slightly.  The chest and abdomen were prepped and draped in the usual sterile  fashion.  Single lung ventilation of the right lung was initiated and was tolerated well throughout the procedure.  A timeout was performed.  An incision was made in approximately the fifth interspace anterolaterally.  An 8 mm robotic port was inserted.  The thoracoscope was advanced into the chest. After confirming intrapleural placement, carbon dioxide was insufflated per protocol.  Additional 8  mm robotic ports were placed in the third and eighth interspaces and a 12 mm AirSeal port was placed in the seventh interspace posterolaterally.  The robotic instruments were advanced into the chest with thoracoscopic visualization. The mass was clearly  visible.  The phrenic nerve was visible inferior to the mass.  There was a large mediastinal fat pad, which made visualization difficult.  The inferior rim of the fat pad surrounding the mass was divided with bipolar cautery initially and dissection was  begun dissecting the mass off the pericardium.  There was no invasion of the pericardium or any other surrounding structures. Inferiorly the phrenic nerve was identified and the dissection was carried along the phrenic nerve, taking care not to use  cautery in the vicinity of the nerve.  Working superiorly, the planes became very indistinct but again dissection was carried further-away from the phrenic nerve in that area. Superiorly there were some larger feeding vessels into the fat pad.  These  were divided with the SynchroSeal device.  Pleura then was divided anteriorly and the remainder of the mass was freed up using bipolar cautery.  Then, the phrenic nerve was again identified.  There was a fatty tissue containing nodes adjacent to the  nerve, which was dissected off with better visualization.  A small piece of the mass, which had become  dislodged was removed and sent for frozen section.  An attempt was made to put the anterior mediastinal mass into an endoscopic retrieval bag with the  robotic instruments,  but we were unable to do so.  The robotic instruments were removed and the robot was undocked.  The mass then was maneuvered into the endoscopic retrieval bag using the existing incisions.  The mass was removed and sent for  permanent pathology.  The chest was copiously irrigated with saline.  The 2 sponges that had been used during the dissection were removed.  The operative site was inspected and there was no significant bleeding.  A 28-French Blake drain was placed through  the original ninth interspace port incision and secured with a #1 silk suture.  Dual lung ventilation was resumed.  The remaining incisions were closed in standard fashion.  All sponge, needle and instrument counts were correct at the end of the  procedure.  The frozen section returned showing lymphoproliferative disorder versus thymoma.  The [name] was extubated in the operating room and taken to the postanesthetic care unit in good condition.  Experienced assistance was necessary for this case due to surgical complexity. Wynelle Beckmann assisted with port placement, instrument exchange, suctioning, specimen retrieval, and wound closure.   PUS D: 01/28/2022 5:14:32 pm T: 01/28/2022 8:10:00 pm  JOB: [id number]/ [id number]

## 2022-01-28 NOTE — Progress Notes (Signed)
Pt c/o head itching, redness noted on forehead. Ginger, RN notified Dr.Hodierne. Vanc IV stopped will continue to monitor pt.

## 2022-01-28 NOTE — Brief Op Note (Addendum)
01/28/2022  4:03 PM  PATIENT:  [name]  81 y.o. female  PRE-OPERATIVE DIAGNOSIS:  ANTERIOR MEDIASTINAL MASS  POST-OPERATIVE DIAGNOSIS:  ANTERIOR MEDIASTINAL MASS_ Lymphoma vs, Thymoma  PROCEDURE:  Procedure(s): XI ROBOTIC ASSISTED RESECTION OF MEDIASTINAL MASS (Left)  SURGEON:  Surgeon(s) and Role: Melrose Nakayama, MD - Primary  PHYSICIAN ASSISTANT: Wynelle Beckmann PA-C  ASSISTANTS: none   ANESTHESIA:   local and general  EBL:  100 mL   BLOOD ADMINISTERED:none  DRAINS:  Left pleural chest tube    LOCAL MEDICATIONS USED:  OTHER Exparel  SPECIMEN:  Source of Specimen:  Anterior mediastinal mass  DISPOSITION OF SPECIMEN:  PATHOLOGY  COUNTS CORRECT:  YES  DICTATION: .Dragon Dictation  PLAN OF CARE: Admit to inpatient   PATIENT DISPOSITION:  PACU - hemodynamically stable.   Delay start of Pharmacological VTE agent (>24hrs) due to surgical blood loss or risk of bleeding: no

## 2022-01-29 ENCOUNTER — Inpatient Hospital Stay (HOSPITAL_COMMUNITY): Payer: Medicare PPO

## 2022-01-29 DIAGNOSIS — Z9889 Other specified postprocedural states: Secondary | ICD-10-CM

## 2022-01-29 LAB — BASIC METABOLIC PANEL
Anion gap: 5 (ref 5–15)
BUN: 20 mg/dL (ref 8–23)
CO2: 24 mmol/L (ref 22–32)
Calcium: 8.3 mg/dL — ABNORMAL LOW (ref 8.9–10.3)
Chloride: 107 mmol/L (ref 98–111)
Creatinine, Ser: 1.08 mg/dL — ABNORMAL HIGH (ref 0.44–1.00)
GFR, Estimated: 51 mL/min — ABNORMAL LOW (ref >60)
Glucose, Bld: 153 mg/dL — ABNORMAL HIGH (ref 70–99)
Potassium: 4.2 mmol/L (ref 3.5–5.1)
Sodium: 136 mmol/L (ref 135–145)

## 2022-01-29 LAB — CBC
HCT: 32.6 % — ABNORMAL LOW (ref 36.0–46.0)
Hemoglobin: 10.6 g/dL — ABNORMAL LOW (ref 12.0–15.0)
MCH: 32.4 pg (ref 26.0–34.0)
MCHC: 32.5 g/dL (ref 30.0–36.0)
MCV: 99.7 fL (ref 80.0–100.0)
Platelets: 174 10*3/uL (ref 150–400)
RBC: 3.27 MIL/uL — ABNORMAL LOW (ref 3.87–5.11)
RDW: 13.5 % (ref 11.5–15.5)
WBC: 10.9 10*3/uL — ABNORMAL HIGH (ref 4.0–10.5)
nRBC: 0 % (ref 0.0–0.2)

## 2022-01-29 MED ORDER — TRAMADOL HCL 50 MG PO TABS: 50.0000 mg | ORAL_TABLET | Freq: Four times a day (QID) | ORAL | 0 refills | Status: DC | PRN

## 2022-01-29 MED ORDER — ACETAMINOPHEN 500 MG PO TABS: 500.0000 mg | ORAL_TABLET | Freq: Four times a day (QID) | ORAL | 0 refills | Status: DC | PRN

## 2022-01-29 NOTE — Anesthesia Postprocedure Evaluation (Signed)
Anesthesia Post Note  Patient: [name]  Procedure(s) Performed: XI ROBOTIC ASSISTED RESECTION OF MEDIASTINAL MASS (Left: Chest)     Patient location during evaluation: PACU Anesthesia Type: General Level of consciousness: awake Pain management: pain level controlled Vital Signs Assessment: post-procedure vital signs reviewed and stable Respiratory status: spontaneous breathing, nonlabored ventilation, respiratory function stable and patient connected to nasal cannula oxygen Cardiovascular status: blood pressure returned to baseline and stable Postop Assessment: no apparent nausea or vomiting Anesthetic complications: no   No notable events documented.  Last Vitals:  Vitals:   01/28/22 2315 01/29/22 0324  BP: (!) 119/52 (!) 117/50  Pulse:    Resp: 16 18  Temp: 36.7 C 36.9 C  SpO2: 96% 97%    Last Pain:  Vitals:   01/29/22 0324  TempSrc: Oral  PainSc:                  Karyl Kinnier Karinda Cabriales

## 2022-01-29 NOTE — Progress Notes (Addendum)
      [address]       [place] [address]             [phone]      1 Day Post-Op Procedure(s) (LRB): XI ROBOTIC ASSISTED RESECTION OF MEDIASTINAL MASS (Left)  Subjective:  Patient w/o complaints.  Her pain is well controlled.  Had some nausea in the recovery area, otherwise no complaints.   Objective: Vital signs in last 24 hours: Temp:  [97.6 F (36.4 C)-98.6 F (37 C)] 98 F (36.7 C) (10/24 0744) Pulse Rate:  [54-73] 54 (10/24 0744) Cardiac Rhythm: Normal sinus rhythm;Bundle branch block (10/23 2000) Resp:  [10-23] 16 (10/24 0744) BP: (116-176)/(46-62) 116/50 (10/24 0744) SpO2:  [90 %-97 %] 97 % (10/24 0744) Arterial Line BP: (119-152)/(45-58) 143/58 (10/23 1800) Weight:  [75.3 kg] 75.3 kg (10/23 0958)  Intake/Output from previous day: 10/23 0701 - 10/24 0700 In: 1350 [P.O.:50; I.V.:1200] Out: 427 [Urine:300; Blood:100; Chest Tube:27] Intake/Output this shift: Total I/O In: -  Out: 300 [Urine:300]  General appearance: alert, cooperative, and no distress Heart: regular rate and rhythm Lungs: clear to auscultation bilaterally Abdomen: soft, non-tender; bowel sounds normal; no masses,  no organomegaly Extremities: extremities normal, atraumatic, no cyanosis or edema Wound: clean and dry  Lab Results: Recent Labs    01/29/22 0157  WBC 10.9*  HGB 10.6*  HCT 32.6*  PLT 174   BMET:  Recent Labs    01/29/22 0157  NA 136  K 4.2  CL 107  CO2 24  GLUCOSE 153*  BUN 20  CREATININE 1.08*  CALCIUM 8.3*    PT/INR: No results for input(s): "LABPROT", "INR" in the last 72 hours. ABG    Component Value Date/Time   PHART 7.46 (H) 01/24/2022 1357   HCO3 29.2 (H) 01/24/2022 1357   TCO2 22 10/21/2013 1726   ACIDBASEDEF 1.0 10/14/2013 1251   O2SAT 98.8 01/24/2022 1357   CBG (last 3)  No results for input(s): "GLUCAP" in the last 72 hours.  Assessment/Plan: S/P Procedure(s) (LRB): XI ROBOTIC ASSISTED RESECTION OF MEDIASTINAL MASS  (Left)  CV- Sinus Loletha Grayer, H/O HTN- continue HCTZ, Lisinopril Pulm- CT with 100 cc output, no leak will d/c today Renal- creatinine slight bump from 0.9 to 1.08- likely from Toradol use, will discontinue D/C IV fluids D/C Foley Dispo- patient stable, will d/c chest tube today, patient overall is doing well, will check back later this afternoon for possible discharge   LOS: 1 day    Ellwood Handler, PA-C 01/29/2022  Patient seen and examined, agree with above Dc chest tube Possibly home later today if able to eat, walk, etc  Remo Lipps C. Roxan Hockey, MD [facility] [phone]

## 2022-01-29 NOTE — Discharge Summary (Addendum)
Physician Discharge Summary  Patient ID: [name] MRN: [id number] DOB/AGE: [birth year] 81 y.o.  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/14/2013   Essential hypertension, benign 08/04/2013   Discharge Diagnoses: Type AB Thymoma  Patient Active Problem List   Diagnosis Date Noted   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/29/2022   Mediastinal mass 01/28/2022   Pulmonary HTN ([place]) 11/16/2021   Ascending aorta dilatation (HCC)    Hyperlipidemia 04/16/2016   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/2015   Carotid artery stenosis 11/10/2013   S/P aortic valve replacement 10/17/2013   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     Recently had a coronary CT. It showed mild CAD and also an incompletely visualized mediastinal mass.  A PET CT showed the mass was 3.7 x 2.8 cm and was hypermetabolic with an SUV of 5.6.   She denies chest pain, shortness of breath, fever, night sweats, weakness, trouble swallowing and double vision.  Hospital Course: Patient presented to [facility] and was brought to the operating room on 01/28/22. She underwent a robotic assisted anterior mediastinal mass resection. She  tolerated the procedure well and was brought to the PACU in stable condition.  The patient has done well post operatively.  Her drain output was low at 100cc and was removed without difficulty.  Follow up CXR showed no pneumothorax and trace left pleural effusion, bilateral atelectasis.  She had a mild elevation in her creatinine due to Toradol use and this was discontinued.  She has been resumed on her home antihypertensive regimen.  She is maintaining NSR.  Her surgical incisions are healing without evidence of infection.  She is medically stable for discharge home today.  Consults: None  Significant Diagnostic Studies: CT Scan:  IMPRESSION: Left-sided mediastinal mass or lymphadenopathy is incompletely visualized, but measures at least 3.6 cm. Recommend PET-CT or CAP CT with contrast for further evaluation.   These results will be called to the ordering clinician or representative by the Radiologist Assistant, and communication documented in the PACS or [employer].    Electronically Signed   By: Marlaine Hind M.D.   On: 11/16/2021 16:10  Treatments: surgery:   Operative Report    DATE OF PROCEDURE: 01/28/2022   PREOPERATIVE DIAGNOSIS:  Anterior mediastinal mass.   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   PROCEDURE:  Robotic left video-assisted thoracoscopy for resection of anterior mediastinal mass.   SURGEON:  Revonda Standard. Roxan Hockey, MD   ASSISTANT:  Wynelle Beckmann, PA   Discharge Exam: Blood pressure (!) 129/52, pulse (!) 50, temperature 98.9 F (37.2 C), temperature source Oral, resp. rate 17, height 5\' 1"  (1.549 m), weight 75.3 kg, SpO2 92 %.  General appearance: alert, cooperative, and no distress Heart: regular rate and rhythm Lungs: clear to auscultation  bilaterally Abdomen: soft, non-tender; bowel sounds normal; no masses,  no organomegaly Extremities: extremities normal, atraumatic, no cyanosis or edema Wound: clean and  dry    Discharge disposition: 01-Home or Self Care      Allergies as of 01/29/2022       Reactions   Crestor [rosuvastatin Calcium] Other (See Comments)   Myalgias with 5mg  daily dose   Lipitor [atorvastatin] Other (See Comments)   Stomach pain   Pravastatin Other (See Comments)   Myalgias   Simvastatin Other (See Comments)   Myalgias   Tramadol Other (See Comments)   FEELS LOOPY   Zetia [ezetimibe]    Myalgias   Codeine Palpitations   Penicillins Palpitations   Sulfa Antibiotics Palpitations   Vancomycin Itching, Other (See Comments)   Pt developed flushing and itchy scalp greater than 276ml into the infusion.  RedMan syndrone developed.  Will need slower admin rate for future infusions.        Medication List     TAKE these medications    acetaminophen 500 MG tablet Commonly known as: TYLENOL Take 1-2 tablets (500-1,000 mg total) by mouth every 6 (six) hours as needed.   carboxymethylcellulose 0.5 % Soln Commonly known as: REFRESH PLUS Place 1 drop into both eyes daily as needed (dry/irritated eyes).   hydrochlorothiazide 12.5 MG capsule Commonly known as: MICROZIDE Take 1 capsule (12.5 mg total) by mouth daily.   lisinopril 10 MG tablet Commonly known as: ZESTRIL Take 1 tablet by mouth once daily   Repatha SureClick 673 MG/ML Soaj Generic drug: Evolocumab Inject 1 Pen into the skin every 14 (fourteen) days.   traMADol 50 MG tablet Commonly known as: ULTRAM Take 1 tablet (50 mg total) by mouth every 6 (six) hours as needed (mild pain).        Follow-up Information     Melrose Nakayama, MD Follow up on 02/12/2022.   Specialty: Cardiothoracic Surgery Why: Appointmet is at 11:45 Contact information: [place] [address] [phone]         [facility] Follow up on 02/12/2022.   Why: Appointment is at 10:45 for chest x-ray Contact information: [place]  [place]                Signed:  Ellwood Handler, PA-C  01/31/2022, 7:12 AM

## 2022-01-29 NOTE — Progress Notes (Signed)
Pt complained of pain and discomfort in her Left lower ankle. Stated "it feels like it has a crick in it" and that the pain was worse when she flexed it. She ambulated with my assistance to the sink and after sitting back on the bed stated "it feels better after I walked than it did in bed". She rated her pain after ambulation as a 4/10. She is now sitting on the edge of her bed doing som ROM exercise on the ankle and the nurse has been notified.

## 2022-01-31 LAB — SURGICAL PATHOLOGY

## 2022-02-11 ENCOUNTER — Other Ambulatory Visit: Payer: Self-pay | Admitting: Thoracic Surgery (Cardiothoracic Vascular Surgery)

## 2022-02-11 DIAGNOSIS — J9859 Other diseases of mediastinum, not elsewhere classified: Secondary | ICD-10-CM

## 2022-02-12 ENCOUNTER — Encounter: Payer: Self-pay | Admitting: Thoracic Surgery (Cardiothoracic Vascular Surgery)

## 2022-02-12 ENCOUNTER — Ambulatory Visit
Admission: RE | Admit: 2022-02-12 | Discharge: 2022-02-12 | Disposition: A | Discharge status: Home / Self Care | Payer: Medicare PPO | Source: Ambulatory Visit | Attending: Thoracic Surgery (Cardiothoracic Vascular Surgery) | Admitting: Thoracic Surgery (Cardiothoracic Vascular Surgery)

## 2022-02-12 ENCOUNTER — Ambulatory Visit (INDEPENDENT_AMBULATORY_CARE_PROVIDER_SITE_OTHER): Payer: Self-pay | Admitting: Thoracic Surgery (Cardiothoracic Vascular Surgery)

## 2022-02-12 ENCOUNTER — Telehealth: Payer: Self-pay | Admitting: Internal Medicine

## 2022-02-12 VITALS — BP 175/72 | HR 55 | Resp 20 | Ht 61.0 in | Wt 165.0 lb

## 2022-02-12 DIAGNOSIS — J9859 Other diseases of mediastinum, not elsewhere classified: Secondary | ICD-10-CM | POA: Diagnosis not present

## 2022-02-12 DIAGNOSIS — Z09 Encounter for follow-up examination after completed treatment for conditions other than malignant neoplasm: Secondary | ICD-10-CM

## 2022-02-12 NOTE — Progress Notes (Signed)
[address]       [place] [address]             [phone]      HPI: [name] returns for a scheduled follow-up visit after recent resection of anterior mediastinal mass.   [name] is an 81 year old woman with a history of aortic stenosis, AVR, CAD, ascending aneurysm, bradycardia, hypertension, hyperlipidemia, mild to moderate pulmonary hypertension, pneumonia, obesity, and osteopenia.  She had a coronary CT which showed mild CAD and a mediastinal mass.  On PET/CT there was a 3.7 x 2.8 hypermetabolic anterior mediastinal mass on the left side.  I did a robotic assisted resection on 01/28/2022.  She went home the following day.  She is a little upset that she is not feeling better.  She has not had to take any pain pills in 4 days.  She just thought she would feel normal by now.  Just feels a little tired and rundown.  Past Medical History:  Diagnosis Date   Abnormal vaginal Pap smear 1994   annual paps for years after that.more recently every other year,last in 2012-we agreed not to do them anymore   Anxiety    no rx   Aortic stenosis    s/p AVR with bioprosthesis   Ascending aorta dilatation (HCC)    31mm by echo 10/2021   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/2015   Carotid artery stenosis    < 50% stenosis bilaterally by doppler 07/2016   Coronary artery disease 2008   Coronary Ca score of 331 with minimal multivessel plaque < 25% stenosis by coronary CTA 8/23   Heart murmur    per pt   Hypercholesteremia    Hypertension    Obesity    Osteopenia    declines treatment   Pneumonia 1995   Pulmonary HTN ([place])    mild to moderate by echo 7/23 with PASP 17mmHg   Shoulder pain    Due to arthritis   Vitamin D insufficiency      Current Outpatient Medications  Medication Sig Dispense Refill   acetaminophen (TYLENOL) 500 MG tablet Take 1-2 tablets (500-1,000 mg total) by mouth every 6 (six) hours as needed. 30 tablet 0   carboxymethylcellulose (REFRESH PLUS) 0.5  % SOLN Place 1 drop into both eyes daily as needed (dry/irritated eyes).     hydrochlorothiazide (MICROZIDE) 12.5 MG capsule Take 1 capsule (12.5 mg total) by mouth daily. 90 capsule 3   lisinopril (ZESTRIL) 10 MG tablet Take 1 tablet by mouth once daily 30 tablet 11   Evolocumab (REPATHA SURECLICK) 591 MG/ML SOAJ Inject 1 Pen into the skin every 14 (fourteen) days. (Patient not taking: Reported on 02/12/2022) 6 mL 3   traMADol (ULTRAM) 50 MG tablet Take 1 tablet (50 mg total) by mouth every 6 (six) hours as needed (mild pain). 30 tablet 0   No current facility-administered medications for this visit.    Physical Exam BP (!) 175/72   Pulse (!) 55   Resp 20   Ht 5\' 1"  (1.549 m)   Wt 165 lb (74.8 kg)   SpO2 95% Comment: RA  BMI 31.69 kg/m  81 year old woman in no acute distress Alert and oriented x3 with no focal deficits Lungs clear with equal breath sounds bilaterally Cardiac regular rate and rhythm Incisions clean dry intact  Diagnostic Tests: CHEST - 2 VIEW   COMPARISON:  01/29/2022   FINDINGS: The lungs are clear without focal pneumonia, edema, pneumothorax or pleural  effusion. Stable blunting left costophrenic angle on the frontal projection without discernible pleural effusion on the lateral film suggest scarring. The cardiopericardial silhouette is within normal limits for size. The visualized bony structures of the thorax are unremarkable.   IMPRESSION: Blunting of the left costophrenic angle may reflect scarring or tiny effusion. Otherwise no acute cardiopulmonary abnormality.     Electronically Signed   By: Misty Stanley M.D.   On: 02/12/2022 10:50 I personally reviewed the chest x-ray images.  There is likely a very small effusion.  There may also be some slight elevation of the left hemidiaphragm.  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  No lymphovascular invasion.  Tumor focally invades capsule and in areas abuts inked peripheral  margin.   Impression: [name] is an 81 year old woman with a history of aortic stenosis, AVR, CAD, ascending aneurysm, bradycardia, hypertension, hyperlipidemia, mild to moderate pulmonary hypertension, pneumonia, obesity, and osteopenia.  She had a coronary CT which showed mild CAD.  More importantly, it also showed an mediastinal mass.    Thymoma-type AB.  Typically has a good prognosis.  Some focal involvement of inked margin.  Suspect it was along the pericardium as there was no fatty tissue between the mass and the pericardium.  No gross invasion of the pericardium.  Suspect that she can just be followed with adjuvant therapy but will refer back to Dr. Julien Nordmann and also refer to radiation oncology to get their opinion.  From a surgical standpoint she is doing well.  She is just now 2 weeks from surgery.  She has not had any pain medication in the last 4 days.  She does not feel great, but that is to be expected this early after surgery.  Should improve with time.  Plan: Referral to Dr. Julien Nordmann and radiation oncology Return in 1 month with PA and lateral chest x-ray  Melrose Nakayama, MD [facility] [phone]

## 2022-02-12 NOTE — Telephone Encounter (Signed)
Scheduled appointment per 11/07 referral. Patient is aware of appointment date and time. Patient is aware to arrive 15 mins prior to appointment time and to bring updated insurance cards. Patient is aware of location.   

## 2022-02-13 ENCOUNTER — Telehealth: Payer: Self-pay | Admitting: Internal Medicine

## 2022-02-13 NOTE — Telephone Encounter (Signed)
Contacted patient to scheduled appointments. Patient is aware of appointments that are scheduled.   

## 2022-02-19 ENCOUNTER — Other Ambulatory Visit: Payer: Medicare PPO

## 2022-02-19 ENCOUNTER — Ambulatory Visit: Payer: Medicare PPO | Admitting: Internal Medicine

## 2022-02-25 ENCOUNTER — Inpatient Hospital Stay: Payer: Medicare PPO | Admitting: Internal Medicine

## 2022-02-25 ENCOUNTER — Other Ambulatory Visit: Payer: Self-pay | Admitting: Medical Oncology

## 2022-02-25 ENCOUNTER — Other Ambulatory Visit: Payer: Self-pay

## 2022-02-25 ENCOUNTER — Inpatient Hospital Stay: Payer: Medicare PPO | Attending: Internal Medicine

## 2022-02-25 ENCOUNTER — Encounter: Payer: Self-pay | Admitting: Internal Medicine

## 2022-02-25 VITALS — BP 178/73 | HR 71 | Temp 97.7°F | Resp 16 | Wt 167.3 lb

## 2022-02-25 DIAGNOSIS — D4989 Neoplasm of unspecified behavior of other specified sites: Secondary | ICD-10-CM

## 2022-02-25 DIAGNOSIS — C37 Malignant neoplasm of thymus: Secondary | ICD-10-CM | POA: Diagnosis not present

## 2022-02-25 DIAGNOSIS — J9859 Other diseases of mediastinum, not elsewhere classified: Secondary | ICD-10-CM

## 2022-02-25 LAB — CBC WITH DIFFERENTIAL (CANCER CENTER ONLY)
Abs Immature Granulocytes: 0.04 10*3/uL (ref 0.00–0.07)
Basophils Absolute: 0 10*3/uL (ref 0.0–0.1)
Basophils Relative: 0 %
Eosinophils Absolute: 0.2 10*3/uL (ref 0.0–0.5)
Eosinophils Relative: 2 %
HCT: 34.9 % — ABNORMAL LOW (ref 36.0–46.0)
Hemoglobin: 11.6 g/dL — ABNORMAL LOW (ref 12.0–15.0)
Immature Granulocytes: 0 %
Lymphocytes Relative: 33 %
Lymphs Abs: 3.2 10*3/uL (ref 0.7–4.0)
MCH: 32.9 pg (ref 26.0–34.0)
MCHC: 33.2 g/dL (ref 30.0–36.0)
MCV: 98.9 fL (ref 80.0–100.0)
Monocytes Absolute: 0.7 10*3/uL (ref 0.1–1.0)
Monocytes Relative: 7 %
Neutro Abs: 5.6 10*3/uL (ref 1.7–7.7)
Neutrophils Relative %: 58 %
Platelet Count: 212 10*3/uL (ref 150–400)
RBC: 3.53 MIL/uL — ABNORMAL LOW (ref 3.87–5.11)
RDW: 13.1 % (ref 11.5–15.5)
WBC Count: 9.8 10*3/uL (ref 4.0–10.5)
nRBC: 0 % (ref 0.0–0.2)

## 2022-02-25 LAB — CMP (CANCER CENTER ONLY)
ALT: 14 U/L (ref 0–44)
AST: 15 U/L (ref 15–41)
Albumin: 4 g/dL (ref 3.5–5.0)
Alkaline Phosphatase: 83 U/L (ref 38–126)
Anion gap: 4 — ABNORMAL LOW (ref 5–15)
BUN: 17 mg/dL (ref 8–23)
CO2: 32 mmol/L (ref 22–32)
Calcium: 9.8 mg/dL (ref 8.9–10.3)
Chloride: 104 mmol/L (ref 98–111)
Creatinine: 0.93 mg/dL (ref 0.44–1.00)
GFR, Estimated: >60 mL/min (ref >60)
Glucose, Bld: 104 mg/dL — ABNORMAL HIGH (ref 70–99)
Potassium: 4.1 mmol/L (ref 3.5–5.1)
Sodium: 140 mmol/L (ref 135–145)
Total Bilirubin: 0.6 mg/dL (ref 0.3–1.2)
Total Protein: 8 g/dL (ref 6.5–8.1)

## 2022-02-25 NOTE — Progress Notes (Signed)
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 Roxan Hockey on January 28, 2022.  CURRENT THERAPY: Observation.  INTERVAL HISTORY: [name] 81 y.o. female returns to the clinic today for follow-up visit accompanied by her son Chriss Czar and her granddaughter Ailene Ravel.  The patient is feeling fine today with no concerning complaints except for occasional pain on the right side of the chest.  She recently underwent resection of the anterior mediastinal mass and the final pathology was consistent with thymoma type AB.  There was close resection margin with some microscopic infiltration of the capsule.  The patient has no shortness of breath, cough or hemoptysis.  She has no nausea, vomiting, diarrhea or constipation.  She has no headache or visual changes.  She is here today for evaluation and discussion of her treatment options.  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 by echo 10/2021   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/2015   Carotid artery stenosis    < 50% stenosis bilaterally by doppler 07/2016   Coronary artery disease 2008   Coronary Ca score of 331 with minimal multivessel plaque < 25% stenosis by coronary CTA 8/23   Heart murmur    per pt   Hypercholesteremia    Hypertension    Obesity    Osteopenia    declines treatment   Pneumonia 1995   Pulmonary HTN ([place])    mild to moderate by echo 7/23 with  PASP 51mmHg   Shoulder pain    Due to arthritis   Vitamin D insufficiency     ALLERGIES:  is allergic to crestor [rosuvastatin calcium], lipitor [atorvastatin], pravastatin, simvastatin, tramadol, zetia [ezetimibe], codeine, penicillins, sulfa antibiotics, and vancomycin.  MEDICATIONS:  Current Outpatient Medications  Medication Sig Dispense Refill   acetaminophen (TYLENOL) 500 MG tablet Take 1-2 tablets (500-1,000 mg total) by mouth every 6 (six) hours as needed. 30 tablet 0   carboxymethylcellulose (REFRESH PLUS) 0.5 % SOLN Place 1 drop into both eyes daily as needed (dry/irritated eyes).     Evolocumab (REPATHA SURECLICK) 417 MG/ML SOAJ Inject 1 Pen into the skin every 14 (fourteen) days. (Patient not taking: Reported on 02/12/2022) 6 mL 3   hydrochlorothiazide (MICROZIDE) 12.5 MG capsule Take 1 capsule (12.5 mg total) by mouth daily. 90 capsule 3   lisinopril (ZESTRIL) 10 MG tablet Take 1 tablet by mouth once daily 30 tablet 11   traMADol (ULTRAM) 50 MG tablet Take 1 tablet (50 mg total) by mouth every 6 (six) hours as needed (mild pain). 30 tablet 0   No current facility-administered medications for this visit.    SURGICAL HISTORY:  Past Surgical History:  Procedure Laterality Date   AORTIC VALVE REPLACEMENT N/A 10/14/2013   Procedure: AORTIC VALVE REPLACEMENT (AVR);  Surgeon: Gaye Pollack, MD;  Location: [facility];  Service: Open Heart Surgery;  Laterality: N/A;  CARDIAC CATHETERIZATION     CATARACT EXTRACTION, BILATERAL     CHOLECYSTECTOMY  04/08/1988   INTRAOPERATIVE TRANSESOPHAGEAL ECHOCARDIOGRAM N/A 10/14/2013   Procedure: INTRAOPERATIVE TRANSESOPHAGEAL ECHOCARDIOGRAM;  Surgeon: Gaye Pollack, MD;  Location: [facility];  Service: Open Heart Surgery;  Laterality: N/A;   LEFT AND RIGHT HEART CATHETERIZATION WITH CORONARY ANGIOGRAM N/A 09/16/2013   Procedure: LEFT AND RIGHT HEART CATHETERIZATION WITH CORONARY ANGIOGRAM;  Surgeon: Sueanne Margarita, MD;  Location: [facility];  Service:  Cardiovascular;  Laterality: N/A;   TONSILLECTOMY      REVIEW OF SYSTEMS:  Constitutional: negative Eyes: negative Ears, nose, mouth, throat, and face: negative Respiratory: positive for pleurisy/chest pain Cardiovascular: negative Gastrointestinal: negative Genitourinary:negative Integument/breast: negative Hematologic/lymphatic: negative Musculoskeletal:negative Neurological: negative Behavioral/Psych: negative Endocrine: negative Allergic/Immunologic: negative   PHYSICAL EXAMINATION: General appearance: alert, cooperative, and no distress Head: Normocephalic, without obvious abnormality, atraumatic Neck: no adenopathy, no JVD, supple, symmetrical, trachea midline, and thyroid not enlarged, symmetric, no tenderness/mass/nodules Lymph nodes: Cervical, supraclavicular, and axillary nodes normal. Resp: clear to auscultation bilaterally Back: symmetric, no curvature. ROM normal. No CVA tenderness. Cardio: regular rate and rhythm, S1, S2 normal, no murmur, click, rub or gallop GI: soft, non-tender; bowel sounds normal; no masses,  no organomegaly Extremities: extremities normal, atraumatic, no cyanosis or edema Neurologic: Alert and oriented X 3, normal strength and tone. Normal symmetric reflexes. Normal coordination and gait  ECOG PERFORMANCE STATUS: 1 - Symptomatic but completely ambulatory  Blood pressure (!) 178/73, pulse 71, temperature 97.7 F (36.5 C), temperature source Oral, resp. rate 16, weight 167 lb 4.8 oz (75.9 kg), SpO2 97 %.  LABORATORY DATA: Lab Results  Component Value Date   WBC 9.8 02/25/2022   HGB 11.6 (L) 02/25/2022   HCT 34.9 (L) 02/25/2022   MCV 98.9 02/25/2022   PLT 212 02/25/2022      Chemistry      Component Value Date/Time   NA 136 01/29/2022 0157   NA 141 10/06/2020 0939   K 4.2 01/29/2022 0157   CL 107 01/29/2022 0157   CO2 24 01/29/2022 0157   BUN 20 01/29/2022 0157   BUN 17 10/06/2020 0939   CREATININE 1.08 (H) 01/29/2022 0157    CREATININE 0.94 12/27/2021 1052   CREATININE 0.85 01/29/2016 0910      Component Value Date/Time   CALCIUM 8.3 (L) 01/29/2022 0157   ALKPHOS 68 01/24/2022 1407   AST 21 01/24/2022 1407   AST 20 12/27/2021 1052   ALT 21 01/24/2022 1407   ALT 18 12/27/2021 1052   BILITOT 0.5 01/24/2022 1407   BILITOT 0.5 12/27/2021 1052       RADIOGRAPHIC STUDIES: DG Chest 2 View  Result Date: 02/12/2022 CLINICAL DATA:  Mediastinal mass. EXAM: CHEST - 2 VIEW COMPARISON:  01/29/2022 FINDINGS: The lungs are clear without focal pneumonia, edema, pneumothorax or pleural effusion. Stable blunting left costophrenic angle on the frontal projection without discernible pleural effusion on the lateral film suggest scarring. The cardiopericardial silhouette is within normal limits for size. The visualized bony structures of the thorax are unremarkable. IMPRESSION: Blunting of the left costophrenic angle may reflect scarring or tiny effusion. Otherwise no acute cardiopulmonary abnormality. Electronically Signed   By: Misty Stanley M.D.   On: 02/12/2022 10:50   DG Chest 2 View  Result Date: 01/29/2022 CLINICAL DATA:  Provided history: Surgery, follow-up. Chest tube removal. Left-sided chest tube removed. EXAM: CHEST - 2 VIEW COMPARISON:  Prior chest radiographs 01/29/2022 and earlier. PET-CT 11/29/2018 FINDINGS: Prior median sternotomy  and aortic valve replacement. Mild cardiomegaly, unchanged. A left-sided mediastinal mass was better appreciated on the PET-CT of 11/28/2021. A previously demonstrated left-sided chest tube has been removed. No evidence of pneumothorax. Persistent moderate left pleural effusion. Underlying atelectasis and/or consolidation. No appreciable airspace consolidation on the right. Degenerative changes of the spine. Surgical clips within the upper abdomen. IMPRESSION: Interval removal of a left-sided chest tube. No evidence of pneumothorax. Persistent moderate left pleural effusion with underlying  atelectasis and/or consolidation. A known mediastinal mass is occult on the current study, and was better appreciated on the prior PET-CT of 11/28/2021. Mild cardiomegaly, unchanged. Aortic Atherosclerosis (ICD10-I70.0). Electronically Signed   By: Kellie Simmering D.O.   On: 01/29/2022 12:39   DG Chest Port 1 View  Result Date: 01/29/2022 CLINICAL DATA:  Mediastinal mass. EXAM: PORTABLE CHEST 1 VIEW COMPARISON:  January 28, 2022. FINDINGS: Status post cardiac valve repair. Stable cardiomegaly. Left-sided chest tube is noted without pneumothorax. Right lung is clear. Stable left basilar at atelectasis is noted with probable small pleural effusion. Bony thorax is unremarkable. IMPRESSION: Left-sided chest tube is noted without pneumothorax. Stable left basilar atelectasis and small pleural effusion. Electronically Signed   By: Marijo Conception M.D.   On: 01/29/2022 08:20   DG Chest Port 1 View  Result Date: 01/28/2022 CLINICAL DATA:  Mediastinal mass, postoperative evaluation EXAM: PORTABLE CHEST 1 VIEW COMPARISON:  01/24/2022, 11/28/2021 FINDINGS: Single frontal view of the chest demonstrates postsurgical changes from median sternotomy and aortic valve prosthesis. Interval left-sided thoracotomy with indwelling left chest tube. The left suprahilar mass seen on the prior examinations is no longer visualized, compatible with interval resection. Small residual postoperative left pneumothorax, volume estimated less than 5%. Left basilar consolidation consistent with atelectasis. No evidence of effusion. The right chest is clear. Postsurgical changes within the soft tissues of the left chest wall. IMPRESSION: 1. Postoperative changes from left thoracotomy and mediastinal mass resection. 2. Small postoperative left pneumothorax measuring less than 5%, with indwelling left chest tube. 3. Left basilar atelectasis. These results will be called to the ordering clinician or representative by the Radiologist Assistant, and  communication documented in the PACS or [employer]. Electronically Signed   By: Randa Ngo M.D.   On: 01/28/2022 16:40    ASSESSMENT AND PLAN: This is a very pleasant 81 years old white female diagnosed with a stage I (T1a, N0, M0) thymoma type AB diagnosed in October 2023 status post resection under the care of Dr. Roxan Hockey on January 28, 2022 with close resection margin and microscopic infiltration of the capsule. I had a lengthy discussion with the patient and her family today about her current condition and treatment options. I recommended for the patient to see radiation oncology for any possible role for adjuvant radiotherapy especially with the close margin. There is no need for systemic chemotherapy at this point and I will continue to monitor her closely with repeat CT scan of the chest in 1 year. For the hypertension, she has whitecoat syndrome and I recommended for her to check her blood pressure closely at home and discussed with her primary care physician for any adjustment of her medication. The patient was advised to call immediately if she has any other concerning symptoms in the interval. The patient voices understanding of current disease status and treatment options and is in agreement with the current care plan.  All questions were answered. The patient knows to call the clinic with any problems, questions or concerns. We can certainly  see the patient much sooner if necessary.  The total time spent in the appointment was 30 minutes.  Disclaimer: This note was dictated with voice recognition software. Similar sounding words can inadvertently be transcribed and may not be corrected upon review.

## 2022-02-26 NOTE — Progress Notes (Signed)
Thoracic Location of Tumor / Histology: Mediastinal mass  Patient presented months ago with symptoms of: She had a coronary CT which showed mild CAD and a mediastinal mass.  On PET/CT there was a 3.7 x 2.8 hypermetabolic anterior mediastinal mass on the left side.   Biopsies of  (if applicable) revealed:  Pathology showed a 3.2 x 3.0 x 2.5 cm type AB thymoma.  No lymphovascular invasion.  Tumor focally invades capsule and in areas abuts inked peripheral margin.   Tobacco/Marijuana/Snuff/ETOH use: none  Past/Anticipated interventions by cardiothoracic surgery, if any:  Dr. Roxan Hockey 02-12-22  HPI: [name] returns for a scheduled follow-up visit after recent resection of anterior mediastinal mass.     [name] is an 81 year old woman with a history of aortic stenosis, AVR, CAD, ascending aneurysm, bradycardia, hypertension, hyperlipidemia, mild to moderate pulmonary hypertension, pneumonia, obesity, and osteopenia.  She had a coronary CT which showed mild CAD and a mediastinal mass.  On PET/CT there was a 3.7 x 2.8 hypermetabolic anterior mediastinal mass on the left side.   I did a robotic assisted resection on 01/28/2022.  She went home the following day.  Impression: [name] is an 81 year old woman with a history of aortic stenosis, AVR, CAD, ascending aneurysm, bradycardia, hypertension, hyperlipidemia, mild to moderate pulmonary hypertension, pneumonia, obesity, and osteopenia.  She had a coronary CT which showed mild CAD.  More importantly, it also showed an mediastinal mass.     Thymoma-type AB.  Typically has a good prognosis.  Some focal involvement of inked margin.  Suspect it was along the pericardium as there was no fatty tissue between the mass and the pericardium.  No gross invasion of the pericardium.  Suspect that she can just be followed with adjuvant therapy but will refer back to Dr. Julien Nordmann and also refer to radiation oncology to get their opinion.   From a  surgical standpoint she is doing well.  She is just now 2 weeks from surgery.  She has not had any pain medication in the last 4 days.  She does not feel great, but that is to be expected this early after surgery.  Should improve with time.   Plan: Referral to Dr. Julien Nordmann and radiation oncology Return in 1 month with PA and lateral chest x-ray   Melrose Nakayama, MD [facility] [phone]  Past/Anticipated interventions by medical oncology, if any:   ASSESSMENT AND PLAN: This is a very pleasant 81 years old white female diagnosed with a stage I (T1a, N0, M0) thymoma type AB diagnosed in October 2023 status post resection under the care of Dr. Roxan Hockey on January 28, 2022 with close resection margin and microscopic infiltration of the capsule. I had a lengthy discussion with the patient and her family today about her current condition and treatment options. I recommended for the patient to see radiation oncology for any possible role for adjuvant radiotherapy especially with the close margin. There is no need for systemic chemotherapy at this point and I will continue to monitor her closely with repeat CT scan of the chest in 1 year. For the hypertension, she has whitecoat syndrome and I recommended for her to check her blood pressure closely at home and discussed with her primary care physician for any adjustment of her medication. The patient was advised to call immediately if she has any other concerning symptoms in the interval. The patient voices understanding of current disease status and treatment options and is in agreement  with the current care plan.   All questions were answered. The patient knows to call the clinic with any problems, questions or concerns. We can certainly see the patient much sooner if necessary.   The total time spent in the appointment was 30 minutes.  Disclaimer: This note was dictated with voice recognition software. Similar  sounding words can inadvertently be transcribed and may not be corrected upon review.                 Electronically signed by Curt Bears, MD at 02/25/2022  9:44 AM  Signs/Symptoms Weight changes, if any: yes, but trying Respiratory complaints, if any: denies at this time Hemoptysis, if any: none Pain issues, if any: pain in back   SAFETY ISSUES: Prior radiation? no Pacemaker/ICD? no Possible current pregnancy? no Is the patient on methotrexate? no  Current Complaints / other details:  no questions or concerns at this time  Vitals:   03/05/22 0840  BP: (!) 146/53  Pulse: 62  Resp: 18  Temp: 98.4 F (36.9 C)  SpO2: 100%

## 2022-03-04 DIAGNOSIS — M546 Pain in thoracic spine: Secondary | ICD-10-CM | POA: Diagnosis not present

## 2022-03-04 NOTE — Progress Notes (Signed)
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 Mediastinal mass  J98.59     2. Thymoma  D49.89     3. 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 female who presented to her cardiologist, Dr. Radford Pax, for a CT scan on 11/16/21 for routine evaluation of her cardiac disease which incidentally revealed a left-sided mediastinal mass vs lymphadenopathy measuring 3.6 cm, (incompletely visualized).    PET scan for further evaluation on 11/28/21 demonstrated the soft tissue mass measuring 3.7 cm, located laterally to the main pulmonary artery with intermediate metabolic activity (SUV max of 5.6). PET otherwise showed no other hypermetabolic mediastinal, hilar or axillary lymph nodes, hypermetabolic pulmonary activity, suspicious nodularity, or evidence of distant metastatic disease. PET however showed an incidental low-density left actinex lung lesion measuring 4.9 x 3.1 cm without metabolic activity, and an intensely hypermetabolic but indeterminate lesion involving the T11 vertebral body corresponding to a nonspecific lucent lesion anteriorly measuring 2.0 x 1.3 cm.   The patient was accordingly referred to Dr. Julien Nordmann on 12/27/21 for further evaluation and management. During this visit, the patient denied any  concerns other than exertional SOB. Given her overall good performance status for her age, Dr. Julien Nordmann recommended proceeding with PFT for consideration of surgical resectioning.   Accordingly, the patient was referred to cardiothoracic surgery, Dr. Roxan Hockey, who recommended proceeding tissue sampling and resection of the mass.   The patient opted to proceed with resectioning and biopsies of the anterior mediastinal mass on 01/28/22 under the care of Dr. Roxan Hockey. Pathology from the procedure revealed: tumor the size of 3.2 x 3.0 x 2.5 cm; histology of type AB thymoma with microscopic penetration of the capsule, invading the perithymic soft tissue, and involving the inked margins focally.     The patient followed up with Dr. Julien Nordmann on 02/25/22 to discuss treatment options. Following a thorough discussion, the patient agreed to a referral to radiation oncology to discuss the role of adjuvant radiation therapy to the thymoma. Although there is no need for systemic therapy at this point, the patient will return to Dr. Julien Nordmann to review repeat imaging in 1 year.   She reports to be doing well today. She is experiencing some right sided, mid-back pain. She has had this months prior to the diagnosis of the thymoma and has not subsided since the resection. She recently saw her PCP for this who believes it is musculoskeletal. Tramadol was prescribed for pain.   She states she is unaware of the vertebral body lesion that was noted on her PET scan at T11.  She is unaccompanied today.  PREVIOUS RADIATION THERAPY: No  PAST MEDICAL HISTORY:  has a past medical history of Abnormal vaginal Pap smear (1994), Anxiety, Aortic stenosis, Ascending aorta dilatation ([place]), Bradycardia (  01/25/2015), Carotid artery stenosis, Coronary artery disease (2008), Heart murmur, Hypercholesteremia, Hypertension, Obesity, Osteopenia, Pneumonia (1995), Pulmonary HTN ([facility]), Shoulder pain, and Vitamin D insufficiency.    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   Procedure: AORTIC VALVE REPLACEMENT (AVR);  Surgeon: Gaye Pollack, MD;  Location: [facility];  Service: Open Heart Surgery;  Laterality: N/A;   CARDIAC CATHETERIZATION     CATARACT EXTRACTION, BILATERAL     CHOLECYSTECTOMY  04/08/1988   INTRAOPERATIVE TRANSESOPHAGEAL ECHOCARDIOGRAM N/A 10/14/2013   Procedure: INTRAOPERATIVE TRANSESOPHAGEAL ECHOCARDIOGRAM;  Surgeon: Gaye Pollack, MD;  Location: [place] [facility];  Service: Open Heart Surgery;  Laterality: N/A;   LEFT AND RIGHT HEART CATHETERIZATION WITH CORONARY ANGIOGRAM N/A 09/16/2013   Procedure: LEFT AND RIGHT HEART CATHETERIZATION WITH CORONARY ANGIOGRAM;  Surgeon: Sueanne Margarita, MD;  Location: [facility];  Service: Cardiovascular;  Laterality: N/A;   TONSILLECTOMY      FAMILY HISTORY: family history includes Arrhythmia in her sister; Dementia in her mother; Heart attack in her brother, father, and maternal grandfather; Heart disease in her brother and father.  SOCIAL HISTORY:  reports that she has never smoked. She has never used smokeless tobacco. She reports that she does not drink alcohol and does not use drugs.  ALLERGIES: Crestor [rosuvastatin calcium], Lipitor [atorvastatin], Pravastatin, Simvastatin, Tramadol, Zetia [ezetimibe], Codeine, Penicillins, Sulfa antibiotics, and Vancomycin  MEDICATIONS:  Current Outpatient Medications  Medication Sig Dispense Refill   acetaminophen (TYLENOL) 500 MG tablet Take 1-2 tablets (500-1,000 mg total) by mouth every 6 (six) hours as needed. 30 tablet 0   carboxymethylcellulose (REFRESH PLUS) 0.5 % SOLN Place 1 drop into both eyes daily as needed (dry/irritated eyes).     hydrochlorothiazide (MICROZIDE) 12.5 MG capsule Take 1 capsule (12.5 mg total) by mouth daily. 90 capsule 3   lisinopril (ZESTRIL) 10 MG tablet Take 1 tablet by mouth once daily 30 tablet 11   Evolocumab (REPATHA SURECLICK)  119 MG/ML SOAJ Inject 1 Pen into the skin every 14 (fourteen) days. (Patient not taking: Reported on 02/12/2022) 6 mL 3   traMADol (ULTRAM) 50 MG tablet Take 1 tablet (50 mg total) by mouth every 6 (six) hours as needed (mild pain). 30 tablet 0   No current facility-administered medications for this encounter.    REVIEW OF SYSTEMS:  Notable for that above.   PHYSICAL EXAM:  height is 5\' 1"  (1.549 m) and weight is 166 lb (75.3 kg). Her oral temperature is 98.4 F (36.9 C). Her blood pressure is 146/53 (abnormal) and her pulse is 62. Her respiration is 18 and oxygen saturation is 100%.   General: Alert and in no acute distress  HEENT: Head is normocephalic. Extraocular movements are intact. Oropharynx is clear. Neck: Neck is supple, no palpable cervical or supraclavicular lymphadenopathy. Heart: Regular in rate and rhythm with no murmurs, rubs, or gallops. Chest: Clear to auscultation bilaterally, with no rhonchi, wheezes, or rales. 4 well healed surgical scars from resection. No signs of infection present.  Abdomen: Soft, nontender, nondistended, with no rigidity or guarding. Extremities: No cyanosis or edema. Lymphatics: see Neck Exam Skin: No concerning lesions. Musculoskeletal: symmetric strength and muscle tone throughout. No obvious deformities of the spine.  Neurologic: Cranial nerves II through XII are grossly intact. No obvious focalities. Speech is fluent. Coordination is intact. Psychiatric: She expresses a sense of overwhelm with the information presented today. Affect is appropriate.  ECOG = 1  0 - Asymptomatic (  Fully active, able to carry on all predisease activities without restriction)  1 - Symptomatic but completely ambulatory (Restricted in physically strenuous activity but ambulatory and able to carry out work of a light or sedentary nature. For example, light housework, office work)  2 - Symptomatic, <50% in bed during the day (Ambulatory and capable of all self care but  unable to carry out any work activities. Up and about more than 50% of waking hours)  3 - Symptomatic, >50% in bed, but not bedbound (Capable of only limited self-care, confined to bed or chair 50% or more of waking hours)  4 - Bedbound (Completely disabled. Cannot carry on any self-care. Totally confined to bed or chair)  5 - Death   Eustace Pen MM, Creech RH, Tormey DC, et al. [phone]). "Toxicity and response criteria of the [facility] Group". [place] Oncol. 5 (6): 649-55   LABORATORY DATA:  Lab Results  Component Value Date   WBC 9.8 02/25/2022   HGB 11.6 (L) 02/25/2022   HCT 34.9 (L) 02/25/2022   MCV 98.9 02/25/2022   PLT 212 02/25/2022   CMP     Component Value Date/Time   NA 140 02/25/2022 0855   NA 141 10/06/2020 0939   K 4.1 02/25/2022 0855   CL 104 02/25/2022 0855   CO2 32 02/25/2022 0855   GLUCOSE 104 (H) 02/25/2022 0855   BUN 17 02/25/2022 0855   BUN 17 10/06/2020 0939   CREATININE 0.93 02/25/2022 0855   CREATININE 0.85 01/29/2016 0910   CALCIUM 9.8 02/25/2022 0855   PROT 8.0 02/25/2022 0855   PROT 6.7 01/28/2017 0957   ALBUMIN 4.0 02/25/2022 0855   ALBUMIN 4.3 01/28/2017 0957   AST 15 02/25/2022 0855   ALT 14 02/25/2022 0855   ALKPHOS 83 02/25/2022 0855   BILITOT 0.6 02/25/2022 0855   GFRNONAA >60 02/25/2022 0855   GFRAA 56 (L) 03/04/2018 0840         RADIOGRAPHY: DG Chest 2 View  Result Date: 02/12/2022 CLINICAL DATA:  Mediastinal mass. EXAM: CHEST - 2 VIEW COMPARISON:  01/29/2022 FINDINGS: The lungs are clear without focal pneumonia, edema, pneumothorax or pleural effusion. Stable blunting left costophrenic angle on the frontal projection without discernible pleural effusion on the lateral film suggest scarring. The cardiopericardial silhouette is within normal limits for size. The visualized bony structures of the thorax are unremarkable. IMPRESSION: Blunting of the left costophrenic angle may reflect scarring or tiny effusion. Otherwise  no acute cardiopulmonary abnormality. Electronically Signed   By: Misty Stanley M.D.   On: 02/12/2022 10:50      IMPRESSION/PLAN: AB Thymoma post resection    Patient is a lovely woman with an AB thymoma that was surgically removed involving inked margins by Dr. Roxan Hockey on 01/28/22 (R1 resection). The standard of treatment is to give targeted radiation therapy after resection with risk of residual microscopic disease. The risk of disease recurrence in her case is high enough to warrant radiation therapy at this time. Patient is a good candidate for therapy and I believe she would tolerate treatment relatively well.   We discussed that daily radiation Mon-Friday would take approximately 6 weeks to complete. We spoke about acute effects including skin irritation and fatigue and esophagitis as well as much less common late effects including internal organ injury (such as lungs or heart). We spoke about the latest technology that is used to minimize the risk of late effects for patients undergoing radiotherapy to the chest. All patient's questions  were answered to her satisfaction.  Consent form was signed today.  She is not ready to make a decision at this time and would like to discuss with her family first.  She states that she feels overwhelmed with all the information; I left a voicemail with her son, Jori Moll, to update him on everything we talked about today, so they can make a decision together.  I gave him my contact information so that he can call me back.  The patient agreed that she will call my nurse, Anderson Malta, within the next week or two once her decision has been made.   I recommend PFTs prior to starting therapy to assess lung function if radiation therapy does take place.   Patient's right sided pain is consistent with the lucent lesion on the T11 vertebral body found on PET scan. It is highly unlikely for a thymoma to metastasize to the vertebrae.  There may be an other etiology for this  spinal lesion which may be contributing to her pain.  I recommended that she get an MRI of her spine and see a spine specialist for further evaluation.  Patient wants to focus on one thing at a time, and wishes not to pursue these studies at this time.  I can speak with her son about this further when he calls me back.  On date of service, in total, I spent 60 minutes on this encounter. Patient was seen in person.   __________________________________________   Leona Singleton, PA   Eppie Gibson, MD  This document serves as a record of services personally performed by Eppie Gibson, MD. It was created on her behalf by Roney Mans, a trained medical scribe. The creation of this record is based on the scribe's personal observations and the provider's statements to them. This document has been checked and approved by the attending provider.

## 2022-03-05 ENCOUNTER — Encounter: Payer: Self-pay | Admitting: Radiation Oncology

## 2022-03-05 ENCOUNTER — Ambulatory Visit
Admission: RE | Admit: 2022-03-05 | Discharge: 2022-03-05 | Disposition: A | Discharge status: Home / Self Care | Payer: Medicare PPO | Source: Ambulatory Visit | Attending: Radiation Oncology | Admitting: Radiation Oncology

## 2022-03-05 VITALS — BP 146/53 | HR 62 | Temp 98.4°F | Resp 18 | Ht 61.0 in | Wt 166.0 lb

## 2022-03-05 DIAGNOSIS — J9859 Other diseases of mediastinum, not elsewhere classified: Secondary | ICD-10-CM | POA: Diagnosis not present

## 2022-03-05 DIAGNOSIS — C37 Malignant neoplasm of thymus: Secondary | ICD-10-CM | POA: Diagnosis not present

## 2022-03-05 DIAGNOSIS — D4989 Neoplasm of unspecified behavior of other specified sites: Secondary | ICD-10-CM | POA: Diagnosis not present

## 2022-03-05 DIAGNOSIS — C3490 Malignant neoplasm of unspecified part of unspecified bronchus or lung: Secondary | ICD-10-CM

## 2022-03-06 ENCOUNTER — Other Ambulatory Visit: Payer: Self-pay | Admitting: Radiation Therapy

## 2022-03-06 ENCOUNTER — Telehealth: Payer: Self-pay

## 2022-03-06 DIAGNOSIS — G8929 Other chronic pain: Secondary | ICD-10-CM

## 2022-03-06 DIAGNOSIS — M546 Pain in thoracic spine: Secondary | ICD-10-CM

## 2022-03-06 NOTE — Telephone Encounter (Signed)
Pt called and left message for Juliette Mangle (for Dr. Isidore Moos), that she will not be seeking radiation therapy treatment. She did however want information on another doctor to help with back pain (Rn sent this message to Dr. Isidore Moos). Rn Anderson Malta will follow up with pt about this information as requested.

## 2022-03-07 ENCOUNTER — Telehealth: Payer: Self-pay | Admitting: Radiation Therapy

## 2022-03-07 NOTE — Telephone Encounter (Signed)
I left a detailed voicemail for the patient and spoke with her son, [name], about the upcoming thoracic spine MRI and consult with Dr. Kathyrn Sheriff.  My contact information was included and I have encouraged [name] to call back with any question or concerns.   Mont Dutton R.T.(R)(T) Radiation Special Procedures Navigator   Spine MRI - [facility] Radiology Department 12/7 - check in at 4:30 Consult with Dr. Kathyrn Sheriff 12/11- check in at 10:00

## 2022-03-12 ENCOUNTER — Ambulatory Visit
Admission: RE | Admit: 2022-03-12 | Discharge: 2022-03-12 | Disposition: A | Discharge status: Home / Self Care | Payer: Medicare PPO | Source: Ambulatory Visit | Attending: Internal Medicine | Admitting: Internal Medicine

## 2022-03-12 ENCOUNTER — Other Ambulatory Visit: Payer: Self-pay | Admitting: Internal Medicine

## 2022-03-12 DIAGNOSIS — M546 Pain in thoracic spine: Secondary | ICD-10-CM

## 2022-03-14 ENCOUNTER — Ambulatory Visit (HOSPITAL_COMMUNITY)
Admission: RE | Admit: 2022-03-14 | Discharge: 2022-03-14 | Disposition: A | Discharge status: Home / Self Care | Payer: Medicare PPO | Source: Ambulatory Visit | Attending: Radiation Oncology | Admitting: Radiation Oncology

## 2022-03-14 DIAGNOSIS — M549 Dorsalgia, unspecified: Secondary | ICD-10-CM | POA: Diagnosis not present

## 2022-03-14 DIAGNOSIS — M546 Pain in thoracic spine: Secondary | ICD-10-CM | POA: Diagnosis not present

## 2022-03-14 MED ORDER — GADOBUTROL 1 MMOL/ML IV SOLN
7.5000 mL | Freq: Once | INTRAVENOUS | Status: AC | PRN
  Administered 2022-03-14: 7.5 mL via INTRAVENOUS

## 2022-03-15 ENCOUNTER — Other Ambulatory Visit: Payer: Self-pay

## 2022-03-15 ENCOUNTER — Telehealth: Payer: Self-pay

## 2022-03-15 ENCOUNTER — Encounter (HOSPITAL_BASED_OUTPATIENT_CLINIC_OR_DEPARTMENT_OTHER): Payer: Self-pay | Admitting: *Deleted

## 2022-03-15 ENCOUNTER — Emergency Department (HOSPITAL_BASED_OUTPATIENT_CLINIC_OR_DEPARTMENT_OTHER)
Admission: EM | Admit: 2022-03-15 | Discharge: 2022-03-15 | Disposition: A | Discharge status: Home / Self Care | Payer: Medicare PPO | Attending: Emergency Medicine | Admitting: Emergency Medicine

## 2022-03-15 DIAGNOSIS — I2510 Atherosclerotic heart disease of native coronary artery without angina pectoris: Secondary | ICD-10-CM | POA: Diagnosis not present

## 2022-03-15 DIAGNOSIS — M546 Pain in thoracic spine: Secondary | ICD-10-CM

## 2022-03-15 DIAGNOSIS — M899 Disorder of bone, unspecified: Secondary | ICD-10-CM | POA: Insufficient documentation

## 2022-03-15 DIAGNOSIS — Z79899 Other long term (current) drug therapy: Secondary | ICD-10-CM | POA: Insufficient documentation

## 2022-03-15 DIAGNOSIS — Z952 Presence of prosthetic heart valve: Secondary | ICD-10-CM | POA: Insufficient documentation

## 2022-03-15 DIAGNOSIS — I10 Essential (primary) hypertension: Secondary | ICD-10-CM | POA: Insufficient documentation

## 2022-03-15 DIAGNOSIS — R001 Bradycardia, unspecified: Secondary | ICD-10-CM | POA: Diagnosis not present

## 2022-03-15 LAB — COMPREHENSIVE METABOLIC PANEL
ALT: 19 U/L (ref 0–44)
AST: 20 U/L (ref 15–41)
Albumin: 4.1 g/dL (ref 3.5–5.0)
Alkaline Phosphatase: 85 U/L (ref 38–126)
Anion gap: 11 (ref 5–15)
BUN: 16 mg/dL (ref 8–23)
CO2: 27 mmol/L (ref 22–32)
Calcium: 9.6 mg/dL (ref 8.9–10.3)
Chloride: 100 mmol/L (ref 98–111)
Creatinine, Ser: 0.79 mg/dL (ref 0.44–1.00)
GFR, Estimated: >60 mL/min (ref >60)
Glucose, Bld: 100 mg/dL — ABNORMAL HIGH (ref 70–99)
Potassium: 3.4 mmol/L — ABNORMAL LOW (ref 3.5–5.1)
Sodium: 138 mmol/L (ref 135–145)
Total Bilirubin: 0.6 mg/dL (ref 0.3–1.2)
Total Protein: 7.8 g/dL (ref 6.5–8.1)

## 2022-03-15 LAB — LACTATE DEHYDROGENASE: LDH: 165 U/L (ref 98–192)

## 2022-03-15 LAB — CBC WITH DIFFERENTIAL/PLATELET
Abs Immature Granulocytes: 0.02 10*3/uL (ref 0.00–0.07)
Basophils Absolute: 0 10*3/uL (ref 0.0–0.1)
Basophils Relative: 1 %
Eosinophils Absolute: 0 10*3/uL (ref 0.0–0.5)
Eosinophils Relative: 0 %
HCT: 35.2 % — ABNORMAL LOW (ref 36.0–46.0)
Hemoglobin: 11.6 g/dL — ABNORMAL LOW (ref 12.0–15.0)
Immature Granulocytes: 0 %
Lymphocytes Relative: 30 %
Lymphs Abs: 2.4 10*3/uL (ref 0.7–4.0)
MCH: 32.1 pg (ref 26.0–34.0)
MCHC: 33 g/dL (ref 30.0–36.0)
MCV: 97.5 fL (ref 80.0–100.0)
Monocytes Absolute: 0.5 10*3/uL (ref 0.1–1.0)
Monocytes Relative: 7 %
Neutro Abs: 5.1 10*3/uL (ref 1.7–7.7)
Neutrophils Relative %: 62 %
Platelets: 228 10*3/uL (ref 150–400)
RBC: 3.61 MIL/uL — ABNORMAL LOW (ref 3.87–5.11)
RDW: 13.1 % (ref 11.5–15.5)
WBC: 8.1 10*3/uL (ref 4.0–10.5)
nRBC: 0 % (ref 0.0–0.2)

## 2022-03-15 LAB — LIPASE, BLOOD: Lipase: 44 U/L (ref 11–51)

## 2022-03-15 LAB — MAGNESIUM: Magnesium: 1.9 mg/dL (ref 1.7–2.4)

## 2022-03-15 MED ORDER — SODIUM CHLORIDE 0.9 % IV BOLUS
500.0000 mL | Freq: Once | INTRAVENOUS | Status: AC
  Administered 2022-03-15: 500 mL via INTRAVENOUS

## 2022-03-15 MED ORDER — HYDROMORPHONE HCL 1 MG/ML IJ SOLN
0.5000 mg | Freq: Once | INTRAMUSCULAR | Status: AC
  Administered 2022-03-15: 0.5 mg via INTRAVENOUS
  Filled 2022-03-15: qty 1

## 2022-03-15 MED ORDER — ACETAMINOPHEN 325 MG PO TABS: 650.0000 mg | ORAL_TABLET | Freq: Four times a day (QID) | ORAL | 0 refills | Status: DC | PRN

## 2022-03-15 MED ORDER — OXYCODONE-ACETAMINOPHEN 5-325 MG PO TABS
1.0000 | ORAL_TABLET | Freq: Once | ORAL | Status: AC
  Administered 2022-03-15: 1 via ORAL
  Filled 2022-03-15: qty 1

## 2022-03-15 MED ORDER — LIDOCAINE 5 % EX PTCH: 1.0000 | MEDICATED_PATCH | Freq: Every day | CUTANEOUS | 0 refills | Status: DC | PRN

## 2022-03-15 MED ORDER — ONDANSETRON HCL 4 MG/2ML IJ SOLN
4.0000 mg | Freq: Once | INTRAMUSCULAR | Status: AC
  Administered 2022-03-15: 4 mg via INTRAVENOUS
  Filled 2022-03-15: qty 2

## 2022-03-15 MED ORDER — IBUPROFEN 600 MG PO TABS: 600.0000 mg | ORAL_TABLET | Freq: Four times a day (QID) | ORAL | 0 refills | Status: DC | PRN

## 2022-03-15 MED ORDER — ONDANSETRON HCL 4 MG PO TABS: 4.0000 mg | ORAL_TABLET | ORAL | 0 refills | Status: DC | PRN

## 2022-03-15 MED ORDER — OXYCODONE HCL 5 MG PO TABS: 5.0000 mg | ORAL_TABLET | ORAL | 0 refills | Status: DC | PRN

## 2022-03-15 NOTE — Telephone Encounter (Signed)
Rn called [name] son Capucine Tryon back after talking to Dr. Isidore Moos about his concerns for his moms pain. Dr. Isidore Moos recommended [name] call Dr. Delfina Redwood to help manage this pain as we are not treating this [name] at this time. He stated she was having 8/10 pain at present. Rn encouraged [name] to reach out to Dr. Delfina Redwood about this concern for pain or if they are unable to get resolution there, then go the emergency room for urgent care and symptom management. [name] stated he would call Dr. Lina Sar office this afternoon for his mom.

## 2022-03-15 NOTE — ED Triage Notes (Signed)
Pt is here for right lower back pain.  Pt has had back pain since her surgery to remove a tumor 5 weeks ago and the surgical site is on the left and well healed.  Pain has been increasing since this surgery and has seen her doctors and has had pain meds, cortisone shot, and a xray on Tuesday and a MRI yesterday.  Pain continues to increase.  Pt is scheduled to see a neurosurgeon Monday

## 2022-03-15 NOTE — Discharge Instructions (Addendum)
It was a pleasure caring for you today in the emergency department.  Please return to the emergency department for any worsening or worrisome symptoms.  Your MRI yesterday was concerning for possible cancer/neoplasm spread to your back; may be associated to you previous thymoma. Please follow up with oncology next week for further evaluation. Please return if symptoms worsen, you have numbness/weakness in your legs, lose control of your bladder/bowels, have a fever, or symptoms worsen please return to ED

## 2022-03-15 NOTE — Telephone Encounter (Signed)
Pt son called to let our office know that pt has had more this week in her back. He reports she was able to make it to her mri appointment. The final results are not yet in. He reports she went to Dr. Delfina Redwood in Tuesday and he gave the pt two shots (one for pain and one cortisone). She got temporary relief with this but then got worse again. She then started using Tylenol and Tramadol and this was not effective for pain control. He reports she is not eating well at times and also is having n/v due effects of pain. He would like to know what advice we can give him for his mom until he sees the Neurosurgeon on Monday. Rn will touch base with family as soon as feedback is given.

## 2022-03-15 NOTE — ED Notes (Signed)
Pt aware of the need for a urine.... Pt unable to currently give urine.

## 2022-03-15 NOTE — ED Provider Notes (Signed)
[facility] Provider Note   CSN: [id number] Arrival date & time: 03/15/22  1606     History  Chief Complaint  Patient presents with   Back Pain    [name] is a 81 y.o. female.  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; osteopenia, pulm htn, CAD, bradycardia, AVR who presents to the ED with complaint of thoracic back pain Ongoing last few weeks but acutely worsened in last week Has been taking apap and tramadol that was leftover from prior surgery w/o much relief Ambulatory but has worsened pain with walking Has been sleeping in recliner last few days 2/2 the pain in her mid/low back No incontinence, no numbness/weakness to arms or legs No trauma Had o/p MRI ordered by rad/onc that was completed yesterday No fevers/chills/unexpected wt loss Nausea yestd w/ emesis x1, poor po last 48 hrs 2/2 discomfort Mild nausea continues but no further emesis       Past Medical History:  Diagnosis Date   Abnormal vaginal Pap smear 1994   annual paps for years after that.more recently every other year,last in 2012-we agreed not to do them anymore   Anxiety    no rx   Aortic stenosis    s/p AVR with bioprosthesis   Ascending aorta dilatation (HCC)    31mm by echo 10/2021   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/2015   Carotid artery stenosis    < 50% stenosis bilaterally by doppler 07/2016   Coronary artery disease 2008   Coronary Ca score of 331 with minimal multivessel plaque < 25% stenosis by coronary CTA 8/23   Heart murmur    per pt   Hypercholesteremia    Hypertension    Obesity    Osteopenia    declines treatment   Pneumonia 1995   Pulmonary HTN ([place])    mild to moderate by echo 7/23 with PASP 79mmHg   Shoulder pain    Due to arthritis   Vitamin D insufficiency     Past Surgical History:  Procedure Laterality Date   AORTIC VALVE REPLACEMENT N/A 10/14/2013   Procedure: AORTIC VALVE  REPLACEMENT (AVR);  Surgeon: Gaye Pollack, MD;  Location: [facility];  Service: Open Heart Surgery;  Laterality: N/A;   CARDIAC CATHETERIZATION     CATARACT EXTRACTION, BILATERAL     CHOLECYSTECTOMY  04/08/1988   INTRAOPERATIVE TRANSESOPHAGEAL ECHOCARDIOGRAM N/A 10/14/2013   Procedure: INTRAOPERATIVE TRANSESOPHAGEAL ECHOCARDIOGRAM;  Surgeon: Gaye Pollack, MD;  Location: [facility];  Service: Open Heart Surgery;  Laterality: N/A;   LEFT AND RIGHT HEART CATHETERIZATION WITH CORONARY ANGIOGRAM N/A 09/16/2013   Procedure: LEFT AND RIGHT HEART CATHETERIZATION WITH CORONARY ANGIOGRAM;  Surgeon: Sueanne Margarita, MD;  Location: [facility];  Service: Cardiovascular;  Laterality: N/A;   TONSILLECTOMY       The history is provided by the patient and a relative. No language interpreter was used.  Back Pain Associated symptoms: no abdominal pain, no chest pain, no dysuria, no fever and no headaches        Home Medications Prior to Admission medications   Medication Sig Start Date End Date Taking? Authorizing Provider  acetaminophen (TYLENOL) 325 MG tablet Take 2 tablets (650 mg total) by mouth every 6 (six) hours as needed. 03/15/22  Yes Wynona Dove A, DO  ibuprofen (ADVIL) 600 MG tablet Take 1 tablet (600 mg total) by mouth every 6 (six) hours as needed. 03/15/22  Yes Jeanell Sparrow, DO  lidocaine (LIDODERM) 5 % Place 1 patch onto the skin daily as needed. Remove & Discard patch within 12 hours or as directed by MD 03/15/22  Yes Wynona Dove A, DO  ondansetron (ZOFRAN) 4 MG tablet Take 1 tablet (4 mg total) by mouth every 4 (four) hours as needed for nausea or vomiting. 03/15/22  Yes Wynona Dove A, DO  oxyCODONE (ROXICODONE) 5 MG immediate release tablet Take 1 tablet (5 mg total) by mouth every 4 (four) hours as needed for severe pain. 03/15/22  Yes Jeanell Sparrow, DO  acetaminophen (TYLENOL) 500 MG tablet Take 1-2 tablets (500-1,000 mg total) by mouth every 6 (six) hours as needed. 01/29/22   Barrett, Lodema Hong,  PA-C  carboxymethylcellulose (REFRESH PLUS) 0.5 % SOLN Place 1 drop into both eyes daily as needed (dry/irritated eyes).    [provider]  Evolocumab (REPATHA SURECLICK) 073 MG/ML SOAJ Inject 1 Pen into the skin every 14 (fourteen) days. Patient not taking: Reported on 02/12/2022 10/15/21   Sueanne Margarita, MD  hydrochlorothiazide (MICROZIDE) 12.5 MG capsule Take 1 capsule (12.5 mg total) by mouth daily. 11/19/21   Sueanne Margarita, MD  lisinopril (ZESTRIL) 10 MG tablet Take 1 tablet by mouth once daily 11/13/21   Sueanne Margarita, MD      Allergies    Crestor [rosuvastatin calcium], Lipitor [atorvastatin], Pravastatin, Simvastatin, Tramadol, Zetia [ezetimibe], Codeine, Penicillins, Sulfa antibiotics, and Vancomycin    Review of Systems   Review of Systems  Constitutional:  Positive for appetite change and fatigue. Negative for activity change and fever.  HENT:  Negative for facial swelling and trouble swallowing.   Eyes:  Negative for discharge and redness.  Respiratory:  Negative for cough and shortness of breath.   Cardiovascular:  Negative for chest pain and palpitations.  Gastrointestinal:  Positive for nausea and vomiting. Negative for abdominal pain.  Genitourinary:  Negative for dysuria and flank pain.  Musculoskeletal:  Positive for back pain. Negative for gait problem.  Skin:  Negative for pallor and rash.  Neurological:  Negative for syncope and headaches.    Physical Exam Updated Vital Signs BP (!) 173/49 (BP Location: Left Arm)   Pulse (!) 56   Temp 98.4 F (36.9 C) (Oral)   Resp 16   SpO2 96%  Physical Exam Vitals and nursing note reviewed.  Constitutional:      General: She is not in acute distress.    Appearance: Normal appearance. She is not ill-appearing, toxic-appearing or diaphoretic.  HENT:     Head: Normocephalic and atraumatic.     Right Ear: External ear normal.     Left Ear: External ear normal.     Nose: Nose normal.     Mouth/Throat:      Mouth: Mucous membranes are moist.  Eyes:     General: No scleral icterus.       Right eye: No discharge.        Left eye: No discharge.  Cardiovascular:     Rate and Rhythm: Regular rhythm. Bradycardia present.     Pulses: Normal pulses.     Heart sounds: Normal heart sounds.  Pulmonary:     Effort: Pulmonary effort is normal. No respiratory distress.     Breath sounds: Normal breath sounds.  Abdominal:     General: Abdomen is flat.     Palpations: Abdomen is soft.     Tenderness: There is no abdominal tenderness. There is no guarding or rebound.  Musculoskeletal:  General: Normal range of motion.       Arms:     Cervical back: Normal range of motion.     Right lower leg: No edema.     Left lower leg: No edema.     Comments: No crepitus or stepoff deformity on palpation   Skin:    General: Skin is warm and dry.     Capillary Refill: Capillary refill takes less than 2 seconds.  Neurological:     General: No focal deficit present.     Mental Status: She is alert and oriented to person, place, and time.     GCS: GCS eye subscore is 4. GCS verbal subscore is 5. GCS motor subscore is 6.     Sensory: Sensation is intact.     Motor: Motor function is intact.  Psychiatric:        Mood and Affect: Mood normal.        Behavior: Behavior normal.     ED Results / Procedures / Treatments   Labs (all labs ordered are listed, but only abnormal results are displayed) Labs Reviewed  CBC WITH DIFFERENTIAL/PLATELET - Abnormal; Notable for the following components:      Result Value   RBC 3.61 (*)    Hemoglobin 11.6 (*)    HCT 35.2 (*)    All other components within normal limits  COMPREHENSIVE METABOLIC PANEL - Abnormal; Notable for the following components:   Potassium 3.4 (*)    Glucose, Bld 100 (*)    All other components within normal limits  MAGNESIUM  LIPASE, BLOOD  LACTATE DEHYDROGENASE    EKG EKG Interpretation  Date/Time:  Friday March 15 2022 17:32:50  EST Ventricular Rate:  52 PR Interval:  157 QRS Duration: 110 QT Interval:  453 QTC Calculation: 422 R Axis:   4 Text Interpretation: Sinus rhythm RSR' in V1 or V2, right VCD or RVH similar to prev no stemi Confirmed by Wynona Dove ([id number]) on 03/15/2022 6:12:57 PM  Radiology MR THORACIC SPINE W WO CONTRAST  Result Date: 03/15/2022 CLINICAL DATA:  Back pain, T11 lesion seen on PET-CT 11/2021 EXAM: MRI THORACIC WITHOUT AND WITH CONTRAST TECHNIQUE: Multiplanar and multiecho pulse sequences of the thoracic spine were obtained without and with intravenous contrast. CONTRAST:  7.26mL GADAVIST GADOBUTROL 1 MMOL/ML IV SOLN COMPARISON:  PET-CT 11/28/2021; thoracic spine radiographs 03/12/2022 FINDINGS: Alignment:  No vertebral subluxation is observed. Vertebrae: 2.5 by 1.9 by 3.3 cm mass anteriorly in the T1 vertebral body has substantially enlarged compared to the prior PET-CT. This lesion has low T1 and mixed but primarily high T2 signal characteristics and diffusely enhances, there is also some enhancement in the adjacent marrow of the T11 vertebral body. Some of the margins of this lesion are sharply defined and some are indistinctly defined. There is also a suggestion of an inferior Schmorl's node or subtle compression fracture along the inferior endplate adjacent to the mass. The appearance is not characteristic of a benign compression fracture and is suspicious for malignancy such as metastatic disease or myeloma. Although I do not observe extension into the posterior vertebral margin or spinal canal, there does appear to be a small amount of right paraspinal extension of tumor just anterior to the right costovertebral junction for example on image 34 series 22 and image 34 series 21. No other similar thoracic spine lesions are identified. No posterior element involvement at this or other levels. Cord:  No abnormal thoracic cord signal or enhancement. Paraspinal and other soft  tissues: Suspected small amount  of right paraspinal extension of tumor at the T11 vertebral level anterior to the costovertebral junction as noted above. Disc levels: Mild disc bulges at the T5-6 and T6-7 levels, but no impingement in the thoracic spine is identified. IMPRESSION: 1. 3.3 cm mass anteriorly in the T1 vertebral body has substantially enlarged compared to the prior PET-CT. The appearance is not characteristic of a benign compression fracture and is suspicious for malignancy such as metastatic disease or myeloma. There is also a small amount of right paraspinal extension of tumor anterior to the right costovertebral junction at the T11 vertebral level. Minimal inferior endplate compression at T11. 2. Mild disc bulges at T5-6 and T6-7 levels, but no impingement in the thoracic spine is identified. Electronically Signed   By: Van Clines M.D.   On: 03/15/2022 16:46    Procedures Procedures    Medications Ordered in ED Medications  HYDROmorphone (DILAUDID) injection 0.5 mg (0.5 mg Intravenous Given 03/15/22 1725)  ondansetron (ZOFRAN) injection 4 mg (4 mg Intravenous Given 03/15/22 1724)  sodium chloride 0.9 % bolus 500 mL (0 mLs Intravenous Stopped 03/15/22 1846)  oxyCODONE-acetaminophen (PERCOCET/ROXICET) 5-325 MG per tablet 1 tablet (1 tablet Oral Given 03/15/22 1935)  HYDROmorphone (DILAUDID) injection 0.5 mg (0.5 mg Intravenous Given 03/15/22 1935)    ED Course/ Medical Decision Making/ A&P                           Medical Decision Making Amount and/or Complexity of Data Reviewed Labs: ordered.  Risk OTC drugs. Prescription drug management.   This patient presents to the ED with chief complaint(s) of back pain with pertinent past medical history of thymoma resection  which further complicates the presenting complaint. The complaint involves an extensive differential diagnosis and also carries with it a high risk of complications and morbidity.    The differential diagnosis includes but not limited to  sprain, strain, fx, infectious, neoplasm, pathologic fx, compression fx, etc Differential diagnosis includes but is not exclusive to musculoskeletal back pain, renal colic, urinary tract infection, pyelonephritis, intra-abdominal causes of back pain, aortic aneurysm or dissection, cauda equina syndrome, sciatica, lumbar disc disease, thoracic disc disease, etc.  . Serious etiologies were considered.   The initial plan is to will call radiology about MRI from yesterday, send screening labs and pain control, anti-emetic   Additional history obtained: Additional history obtained from family Records reviewed Primary Care Documents and prior labs/imaging Follows w/ Dr Julien Nordmann oncology Dr Roxan Hockey CTS  Independent labs interpretation:  The following labs were independently interpreted:  Ldh/lipase/mg wnl CBC stable, hgb similar to baseline  Cmp stable  Independent visualization of imaging: - I independently visualized the following imaging with scope of interpretation limited to determining acute life threatening conditions related to emergency care: MRI reviewed from yestd, which revealed T11 mass, ?metastatic lesion. " IMPRESSION: 1. 3.3 cm mass anteriorly in the T1 vertebral body has substantially enlarged compared to the prior PET-CT. The appearance is not characteristic of a benign compression fracture and is suspicious for malignancy such as metastatic disease or myeloma. There is also a small amount of right paraspinal extension of tumor anterior to the right costovertebral junction at the T11 vertebral level. Minimal inferior endplate compression at T11. 2. Mild disc bulges at T5-6 and T6-7 levels, but no impingement in the thoracic spine is identified.  "  Cardiac monitoring was reviewed and interpreted by myself which shows NSR  Treatment  and Reassessment: Dilaudid Zofran IVF >> improved   Consultation: - Consulted or discussed management/test interpretation w/  external professional: oncology Dr Irene Limbo, recommend f/u in office next week  Consideration for admission or further workup: Admission was considered   Pt here today with back pain, MRI reviewed, concerning for metastatic disease/neoplasm. Her neuro exam is non-focal, gait is steady. No fevers. She has f/u with NSGY on Monday dr Kathyrn Sheriff, advised her to keep this appt; may need nsgy intervention in the future given spine mass.   The patient improved significantly and was discharged in stable condition. Detailed discussions were had with the patient regarding current findings, and need for close f/u with PCP or on call doctor. The patient has been instructed to return immediately if the symptoms worsen in any way for re-evaluation. Patient verbalized understanding and is in agreement with current care plan. All questions answered prior to discharge.    Social Determinants of health: Social History   Tobacco Use   Smoking status: Never   Smokeless tobacco: Never  Vaping Use   Vaping Use: Never used  Substance Use Topics   Alcohol use: No   Drug use: No            Final Clinical Impression(s) / ED Diagnoses Final diagnoses:  Lesion of bone of thoracic spine  Acute midline thoracic back pain    Rx / DC Orders ED Discharge Orders          Ordered    oxyCODONE (ROXICODONE) 5 MG immediate release tablet  Every 4 hours PRN        03/15/22 2026    acetaminophen (TYLENOL) 325 MG tablet  Every 6 hours PRN        03/15/22 2026    ibuprofen (ADVIL) 600 MG tablet  Every 6 hours PRN        03/15/22 2026    lidocaine (LIDODERM) 5 %  Daily PRN        03/15/22 2026    ondansetron (ZOFRAN) 4 MG tablet  Every 4 hours PRN        03/15/22 2031              Jeanell Sparrow, DO 03/15/22 2034

## 2022-03-18 ENCOUNTER — Inpatient Hospital Stay: Payer: Medicare PPO | Attending: Internal Medicine

## 2022-03-18 ENCOUNTER — Other Ambulatory Visit: Payer: Self-pay | Admitting: Thoracic Surgery (Cardiothoracic Vascular Surgery)

## 2022-03-18 DIAGNOSIS — M8448XA Pathological fracture, other site, initial encounter for fracture: Secondary | ICD-10-CM | POA: Diagnosis not present

## 2022-03-18 DIAGNOSIS — D4989 Neoplasm of unspecified behavior of other specified sites: Secondary | ICD-10-CM

## 2022-03-19 ENCOUNTER — Other Ambulatory Visit: Payer: Self-pay | Admitting: Neurosurgery

## 2022-03-19 ENCOUNTER — Ambulatory Visit (INDEPENDENT_AMBULATORY_CARE_PROVIDER_SITE_OTHER): Payer: Self-pay | Admitting: Thoracic Surgery (Cardiothoracic Vascular Surgery)

## 2022-03-19 ENCOUNTER — Encounter: Payer: Self-pay | Admitting: Thoracic Surgery (Cardiothoracic Vascular Surgery)

## 2022-03-19 ENCOUNTER — Ambulatory Visit
Admission: RE | Admit: 2022-03-19 | Discharge: 2022-03-19 | Disposition: A | Discharge status: Home / Self Care | Payer: Medicare PPO | Source: Ambulatory Visit | Attending: Thoracic Surgery (Cardiothoracic Vascular Surgery) | Admitting: Thoracic Surgery (Cardiothoracic Vascular Surgery)

## 2022-03-19 VITALS — BP 190/78 | HR 56 | Resp 20 | Ht 61.0 in | Wt 164.0 lb

## 2022-03-19 DIAGNOSIS — D4989 Neoplasm of unspecified behavior of other specified sites: Secondary | ICD-10-CM

## 2022-03-19 DIAGNOSIS — R0602 Shortness of breath: Secondary | ICD-10-CM | POA: Diagnosis not present

## 2022-03-19 DIAGNOSIS — C37 Malignant neoplasm of thymus: Secondary | ICD-10-CM

## 2022-03-19 NOTE — Progress Notes (Signed)
[address]       [place] [address]             [phone]     HPI: [name] returns for a scheduled follow-up visit after thymectomy.  [name] is an 81 year old woman with a history of AS, AVR, CAD, ascending aneurysm, bradycardia, hypertension, hyperlipidemia, pulmonary hypertension, pneumonia, obesity, osteopenia, and thymoma.  She had a CT for coronary calcium scoring which revealed a 3.7 x 2.8 cm anterior mediastinal mass.  It was hypermetabolic on PET/CT.  There also was a lesion in T11.  I did a robotic assisted thymectomy on 01/28/2022.  It was a grossly complete resection.  She went home the following day.  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  She did not have much pain from the surgery.  Has been having back and right-sided chest pain.  That predated the surgery but has gotten worse.  In the interim since her last visit she had an MRI of the spine which showed a compression fracture of T11 with concern for tumor there.  She has seen Dr. Isidore Moos and Dr. Kathyrn Sheriff.  She is supposed to have a biopsy of that, but that has not been scheduled yet.  She is hoping to hear about that today.  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   Carotid artery stenosis    < 50% stenosis bilaterally by doppler 07/2016   Coronary artery disease 2008   Coronary Ca score of 331 with minimal multivessel plaque < 25% stenosis by coronary CTA 8/23   Heart murmur    per pt   Hypercholesteremia    Hypertension    Obesity    Osteopenia    declines treatment   Pneumonia 1995   Pulmonary HTN ([place])    mild to moderate by echo 7/23 with PASP 33mmHg    Shoulder pain    Due to arthritis   Vitamin D insufficiency      Current Outpatient Medications  Medication Sig Dispense Refill   acetaminophen (TYLENOL) 325 MG tablet Take 2 tablets (650 mg total) by mouth every 6 (six) hours as needed. 36 tablet 0   acetaminophen (TYLENOL) 500 MG tablet Take 1-2 tablets (500-1,000 mg total) by mouth every 6 (six) hours as needed. 30 tablet 0   carboxymethylcellulose (REFRESH PLUS) 0.5 % SOLN Place 1 drop into both eyes daily as needed (dry/irritated eyes).     Evolocumab (REPATHA SURECLICK) 027 MG/ML SOAJ Inject 1 Pen into the skin every 14 (fourteen) days. 6 mL 3   hydrochlorothiazide (MICROZIDE) 12.5 MG capsule Take 1 capsule (12.5 mg total) by mouth daily. 90 capsule 3   ibuprofen (ADVIL) 600 MG tablet Take 1 tablet (600 mg total) by mouth every 6 (six) hours as needed. 30 tablet 0   lidocaine (LIDODERM) 5 % Place 1 patch onto the skin daily as needed. Remove & Discard patch within 12 hours or as directed by MD 15 patch 0   lisinopril (ZESTRIL) 10 MG tablet Take 1 tablet by mouth once daily 30 tablet 11  ondansetron (ZOFRAN) 4 MG tablet Take 1 tablet (4 mg total) by mouth every 4 (four) hours as needed for nausea or vomiting. 8 tablet 0   oxyCODONE (ROXICODONE) 5 MG immediate release tablet Take 1 tablet (5 mg total) by mouth every 4 (four) hours as needed for severe pain. 20 tablet 0   No current facility-administered medications for this visit.    Physical Exam BP (!) 190/78 (BP Location: Right Arm, Patient Position: Sitting, Cuff Size: Large)   Pulse (!) 56   Resp 20   Ht 5\' 1"  (1.549 m)   Wt 164 lb (74.4 kg)   SpO2 98% Comment: RA  BMI 30.99 kg/m  Elderly woman in no acute distress Alert and oriented x 3 with no focal deficits Lungs clear bilaterally Cardiac bradycardic but regular  Diagnostic Tests: CHEST - 2 VIEW   COMPARISON:  Radiograph 02/12/2022   FINDINGS: Unchanged cardiomediastinal silhouette with prior median  sternotomy and aortic valve replacement. There is no new focal airspace disease. Unchanged blunting of the costophrenic sulci. No evidence of pneumothorax. Anterior compression deformity of T11 with progressive height loss, up to 45% height loss, previously 30%. Thoracic spondylosis.   IMPRESSION: No new focal airspace disease. Unchanged costophrenic angle blunting which could reflect scarring or small effusions.   Anterior compression deformity of T11 with progressive height loss, up to 45%, previously 30%.     Electronically Signed   By: Maurine Simmering M.D.   On: 03/19/2022 10:25 I personally reviewed the chest x-ray images.  Findings as noted above.  Impression: [name] is an 81 year old woman with a history of AS, AVR, CAD, ascending aneurysm, bradycardia, hypertension, hyperlipidemia, pulmonary hypertension, pneumonia, obesity, osteopenia, and thymoma.    Type AB thymoma-complete resection grossly.  There were tumor cells at the inked margin, so possibility of residual microscopic disease.  She saw Dr. Isidore Moos to discuss radiation.  Still has not made a final decision regarding that.  Compression fracture of T11 suspicious for malignancy.  She has seen Dr. Kathyrn Sheriff.  Awaiting biopsy.  Plan: Follow-up with Dr. Isidore Moos and Dr. Kathyrn Sheriff regarding spine Return in 3 months to check on progress.  Will do a PA and lateral chest x-ray at that time.  Melrose Nakayama, MD [facility] [phone]

## 2022-03-20 ENCOUNTER — Other Ambulatory Visit: Payer: Self-pay | Admitting: Neurosurgery

## 2022-03-22 NOTE — Pre-Procedure Instructions (Signed)
Surgical Instructions    Your procedure is scheduled on March 27, 2022.  Report to [facility] Main Entrance "A" at 6:30 A.M., then check in with the Admitting office.  Call this number if you have problems the morning of surgery:  [phone]   If you have any questions prior to your surgery date call [phone]: Open Monday-Friday 8am-4pm    Remember:  Do not eat or drink after midnight the night before your surgery    Take these medicines the morning of surgery with A SIP OF WATER:  NONE    May take IF NEEDED:  acetaminophen (TYLENOL)   carboxymethylcellulose (REFRESH PLUS)   ondansetron (ZOFRAN)   oxyCODONE (ROXICODONE)    As of today, STOP taking any Aspirin (unless otherwise instructed by your surgeon) Aleve, Naproxen, Ibuprofen, Motrin, Advil, Goody's, BC's, all herbal medications, fish oil, and all vitamins.                     Do NOT Smoke (Tobacco/Vaping) for 24 hours prior to your procedure.  If you use a CPAP at night, you may bring your mask/headgear for your overnight stay.   Contacts, glasses, piercing's, hearing aid's, dentures or partials may not be worn into surgery, please bring cases for these belongings.    For patients admitted to the hospital, discharge time will be determined by your treatment team.   Patients discharged the day of surgery will not be allowed to drive home, and someone needs to stay with them for 24 hours.  SURGICAL WAITING ROOM VISITATION Patients having surgery or a procedure may have no more than 2 support people in the waiting area - these visitors may rotate.   Children under the age of 68 must have an adult with them who is not the patient. If the patient needs to stay at the hospital during part of their recovery, the visitor guidelines for inpatient rooms apply. Pre-op nurse will coordinate an appropriate time for 1 support person to accompany patient in pre-op.  This support person may not rotate.   Please refer to  the [facility] website for the visitor guidelines for Inpatients (after your surgery is over and you are in a regular room).    Special instructions:   [facility]- Preparing For Surgery  Before surgery, you can play an important role. Because skin is not sterile, your skin needs to be as free of germs as possible. You can reduce the number of germs on your skin by washing with CHG (chlorahexidine gluconate) Soap before surgery.  CHG is an antiseptic cleaner which kills germs and bonds with the skin to continue killing germs even after washing.    Oral Hygiene is also important to reduce your risk of infection.  Remember - BRUSH YOUR TEETH THE MORNING OF SURGERY WITH YOUR REGULAR TOOTHPASTE  Please do not use if you have an allergy to CHG or antibacterial soaps. If your skin becomes reddened/irritated stop using the CHG.  Do not shave (including legs and underarms) for at least 48 hours prior to first CHG shower. It is OK to shave your face.  Please follow these instructions carefully.   Shower the NIGHT BEFORE SURGERY and the MORNING OF SURGERY  If you chose to wash your hair, wash your hair first as usual with your normal shampoo.  After you shampoo, rinse your hair and body thoroughly to remove the shampoo.  Use CHG Soap as you would any other liquid soap. You can apply CHG  directly to the skin and wash gently with a scrungie or a clean washcloth.   Apply the CHG Soap to your body ONLY FROM THE NECK DOWN.  Do not use on open wounds or open sores. Avoid contact with your eyes, ears, mouth and genitals (private parts). Wash Face and genitals (private parts)  with your normal soap.   Wash thoroughly, paying special attention to the area where your surgery will be performed.  Thoroughly rinse your body with warm water from the neck down.  DO NOT shower/wash with your normal soap after using and rinsing off the CHG Soap.  Pat yourself dry with a CLEAN TOWEL.  Wear CLEAN PAJAMAS to bed  the night before surgery  Place CLEAN SHEETS on your bed the night before your surgery  DO NOT SLEEP WITH PETS.   Day of Surgery: Take a shower with CHG soap. Do not wear jewelry or makeup Do not wear lotions, powders, perfumes/colognes, or deodorant. Do not shave 48 hours prior to surgery.  Men may shave face and neck. Do not bring valuables to the hospital.  [facility] is not responsible for any belongings or valuables. Do not wear nail polish, gel polish, artificial nails, or any other type of covering on natural nails (fingers and toes) If you have artificial nails or gel coating that need to be removed by a nail salon, please have this removed prior to surgery. Artificial nails or gel coating may interfere with anesthesia's ability to adequately monitor your vital signs.  Wear Clean/Comfortable clothing the morning of surgery Remember to brush your teeth WITH YOUR REGULAR TOOTHPASTE.   Please read over the following fact sheets that you were given.    If you received a COVID test during your pre-op visit  it is requested that you wear a mask when out in public, stay away from anyone that may not be feeling well and notify your surgeon if you develop symptoms. If you have been in contact with anyone that has tested positive in the last 10 days please notify you surgeon.

## 2022-03-25 ENCOUNTER — Encounter (HOSPITAL_COMMUNITY)
Admission: RE | Admit: 2022-03-25 | Discharge: 2022-03-25 | Disposition: A | Discharge status: Home / Self Care | Payer: Medicare PPO | Source: Ambulatory Visit | Attending: Neurosurgery | Admitting: Neurosurgery

## 2022-03-25 ENCOUNTER — Encounter (HOSPITAL_COMMUNITY): Payer: Self-pay

## 2022-03-25 ENCOUNTER — Other Ambulatory Visit: Payer: Self-pay

## 2022-03-25 VITALS — BP 155/47 | HR 63 | Temp 97.6°F | Resp 18 | Ht 61.0 in | Wt 158.4 lb

## 2022-03-25 DIAGNOSIS — R079 Chest pain, unspecified: Secondary | ICD-10-CM | POA: Diagnosis not present

## 2022-03-25 DIAGNOSIS — I472 Ventricular tachycardia, unspecified: Secondary | ICD-10-CM | POA: Diagnosis not present

## 2022-03-25 DIAGNOSIS — Z01818 Encounter for other preprocedural examination: Secondary | ICD-10-CM

## 2022-03-25 DIAGNOSIS — R001 Bradycardia, unspecified: Secondary | ICD-10-CM | POA: Insufficient documentation

## 2022-03-25 DIAGNOSIS — I471 Supraventricular tachycardia, unspecified: Secondary | ICD-10-CM | POA: Insufficient documentation

## 2022-03-25 DIAGNOSIS — R222 Localized swelling, mass and lump, trunk: Secondary | ICD-10-CM | POA: Insufficient documentation

## 2022-03-25 DIAGNOSIS — I700 Atherosclerosis of aorta: Secondary | ICD-10-CM | POA: Insufficient documentation

## 2022-03-25 DIAGNOSIS — I517 Cardiomegaly: Secondary | ICD-10-CM | POA: Diagnosis not present

## 2022-03-25 DIAGNOSIS — Z953 Presence of xenogenic heart valve: Secondary | ICD-10-CM | POA: Insufficient documentation

## 2022-03-25 DIAGNOSIS — I2510 Atherosclerotic heart disease of native coronary artery without angina pectoris: Secondary | ICD-10-CM | POA: Diagnosis not present

## 2022-03-25 DIAGNOSIS — Z01812 Encounter for preprocedural laboratory examination: Secondary | ICD-10-CM | POA: Insufficient documentation

## 2022-03-25 NOTE — Progress Notes (Signed)
PCP - Dr. Lars Pinks P. Raju Cardiologist - Dr. Golden Hurter  PPM/ICD - Denies Device Orders - n/a Rep Notified - n/a  Chest x-ray - 03/19/2022 EKG - 03/19/2022 Stress Test - Per pt, many years ago. Doesn't remember result ECHO - 10/10/2021 Cardiac Cath - 09/16/2013  Sleep Study - Denies CPAP - n/a  No DM  Last dose of GLP1 agonist- n/a GLP1 instructions: n/a  Blood Thinner Instructions: n/a Aspirin Instructions: n/a  NPO after midnight  COVID TEST- n/a   Anesthesia review: Yes. Cardiac Hx. Discussed with Karoline Caldwell, PA-C   Patient denies shortness of breath, fever, cough and chest pain at PAT appointment   All instructions explained to the patient, with a verbal understanding of the material. Patient agrees to go over the instructions while at home for a better understanding. Patient also instructed to self quarantine after being tested for COVID-19. The opportunity to ask questions was provided.

## 2022-03-26 LAB — SURGICAL PCR SCREEN
MRSA, PCR: NEGATIVE
Staphylococcus aureus: POSITIVE — AB

## 2022-03-26 NOTE — Anesthesia Preprocedure Evaluation (Addendum)
Anesthesia Evaluation    Reviewed: Allergy & Precautions, Patient's Chart, lab work & pertinent test results  History of Anesthesia Complications Negative for: history of anesthetic complications  Airway Mallampati: II  TM Distance: >3 FB Neck ROM: Full    Dental  (+) Dental Advisory Given, Partial Upper   Pulmonary neg pulmonary ROS   Pulmonary exam normal        Cardiovascular hypertension, Pt. on medications pulmonary hypertension+ CAD and + Peripheral Vascular Disease  Normal cardiovascular exam   '23 TTE - EF 60 to 65%. There is mild left ventricular hypertrophy. G1DD (impaired relaxation). The right ventricular size is moderately enlarged. There is mildly elevated pulmonary artery systolic pressure. The estimated right ventricular systolic pressure is 42.3 mmHg. Left atrial size was severely dilated. Right atrial size was mildly dilated. Mild MR. Bioprosthetic aortic valve. Mean gradient 17 mmHg with no significant  regurgitation noted. There is mild dilatation of the ascending aorta, measuring 40 mm.   S/p AVR    Neuro/Psych  PSYCHIATRIC DISORDERS Anxiety     negative neurological ROS     GI/Hepatic negative GI ROS, Neg liver ROS,,,  Endo/Other   Thymoma s/p resection   Renal/GU negative Renal ROS     Musculoskeletal negative musculoskeletal ROS (+)    Abdominal   Peds  Hematology  (+) Blood dyscrasia, anemia   Anesthesia Other Findings   Reproductive/Obstetrics                             Anesthesia Physical Anesthesia Plan  ASA: 3  Anesthesia Plan: General   Post-op Pain Management: Tylenol PO (pre-op)*   Induction: Intravenous  PONV Risk Score and Plan: 3 and Treatment may vary due to age or medical condition, Ondansetron and Propofol infusion  Airway Management Planned: Oral ETT  Additional Equipment: None  Intra-op Plan:   Post-operative Plan: Extubation in  OR  Informed Consent: I have reviewed the patients History and Physical, chart, labs and discussed the procedure including the risks, benefits and alternatives for the proposed anesthesia with the patient or authorized representative who has indicated his/her understanding and acceptance.     Dental advisory given  Plan Discussed with: CRNA and Anesthesiologist  Anesthesia Plan Comments: (See PAT note)        Anesthesia Quick Evaluation

## 2022-03-26 NOTE — Progress Notes (Signed)
Anesthesia Chart Review:  Follows with cardiology for history of HTN, mildly dilated ascending aorta, HLD, PVCs, nonobstructive CAD, aortic stenosis s/p bioprosthetic AVR.  Echo 10/2021 showedEF 60 to 65%, grade 1 DD, mild pulmonary hypertension, severely dilated LA, mild MR, stable bioprosthetic AV with mean gradient 17 mmHg.  Coronary CT 11/2021 showed minimal nonobstructive CAD.  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  This was followed by PET scan which showed mass lateral to the main pulmonary artery measuring 3.7 x 2.8 cm with intermediate metabolic activity with SUV max of 5.6.  Patient was seen by oncology and subsequently referred to cardiothoracic surgery.   Pt underwent robotic assisted resection of mediastinal mass 09/38/18 without complication.  Now with severe pain secondary to compression fracture of T11 with concern for tumor there.  CMP and CBC from 03/15/22 reviewed, potassium mildly low 3.4, mild anemia Hgb 1.6. Otherwise unremarkable.   EKG 01/24/2022: Sinus bradycardia.  Rate 52. Incomplete right bundle branch block   PET scan 11/28/2021: IMPRESSION: 1. The recently demonstrated left mediastinal mass demonstrates moderate hypermetabolic activity, suspicious for neoplasm. The differential remains broad and includes lymphadenopathy/lymphoma as well as atypical thymoma. Management options include tissue sampling and surveillance given the patient's age. 2. No other hypermetabolic lymph nodes in the neck, chest, abdomen or pelvis. 3. Indeterminate hypermetabolic lucent lesion in the T11 vertebral body which may be better characterized with MRI of the thoracic spine without and with contrast. 4. Cystic appearing left adnexal lesion without metabolic activity, most likely a benign cyst. Recommend follow-up US in 6-12 months. Note: This recommendation does not apply to premenarchal patients and to those with increased risk (genetic, family history, elevated tumor  markers or other high-risk factors) of ovarian cancer. Reference: JACR 2020 Feb; 17(2):248-254   Coronary CT 11/16/2021: IMPRESSION: 1. Coronary calcium score of 331. This was 27 percentile for age and sex matched control.   2. Normal coronary origin with right dominance.   3. Scattered multivessel calcified plaque, 0-24% stenosis. No flow limiting disease (RoadMap analysis).   4.  Bioprosthetic aortic valve.   5.  Aortic atherosclerosis.   CAD-RADS 1. Minimal non-obstructive CAD (0-24%). Consider non-atherosclerotic causes of chest pain. Consider preventive therapy and risk factor modification.   TTE 10/10/2021:  1. Left ventricular ejection fraction, by estimation, is 60 to 65%. The  left ventricle has normal function. The left ventricle has no regional  wall motion abnormalities. There is mild left ventricular hypertrophy.  Left ventricular diastolic parameters  are consistent with Grade I diastolic dysfunction (impaired relaxation).   2. Right ventricular systolic function is normal. The right ventricular  size is moderately enlarged. There is mildly elevated pulmonary artery  systolic pressure. The estimated right ventricular systolic pressure is  29.9 mmHg.   3. Left atrial size was severely dilated.   4. Right atrial size was mildly dilated.   5. The mitral valve is normal in structure. Mild mitral valve  regurgitation. No evidence of mitral stenosis.   6. Bioprosthetic aortic valve. Mean gradient 17 mmHg with no significant  regurgitation noted.   7. Aortic dilatation noted. There is mild dilatation of the ascending  aorta, measuring 40 mm.   8. The inferior vena cava is normal in size with greater than 50%  respiratory variability, suggesting right atrial pressure of 3 mmHg.    3-day event monitor 10/01/2021: Predominant rhythm was sinus bradycardia with an average heartbeat of 50 bpm and ranged from 37 to 86 bpm 1 run  of nonsustained ventricular tachycardia lasting  13 beats at a rate of 129 bpm 3 episodes of SVT with the fastest interval at 122 bpm and the longest interval lasting 10 beats Isolated PACs, PVCs, ventricular couplets, trigeminal and bigeminal PVCs. PVC load 2.6%   Wynonia Musty [facility] Short Stay Center/Anesthesiology Phone [phone] 03/26/2022 9:31 AM

## 2022-03-27 ENCOUNTER — Ambulatory Visit (HOSPITAL_COMMUNITY)
Admission: RE | Admit: 2022-03-27 | Discharge: 2022-03-27 | Disposition: A | Discharge status: Home / Self Care | Payer: Medicare PPO | Attending: Neurosurgery | Admitting: Neurosurgery

## 2022-03-27 ENCOUNTER — Ambulatory Visit (HOSPITAL_COMMUNITY): Payer: Medicare PPO

## 2022-03-27 ENCOUNTER — Other Ambulatory Visit: Payer: Self-pay

## 2022-03-27 ENCOUNTER — Encounter (HOSPITAL_COMMUNITY)
Admission: RE | Disposition: A | Discharge status: Home / Self Care | Payer: Self-pay | Source: Home / Self Care | Attending: Neurosurgery

## 2022-03-27 ENCOUNTER — Ambulatory Visit (HOSPITAL_BASED_OUTPATIENT_CLINIC_OR_DEPARTMENT_OTHER): Payer: Medicare PPO | Admitting: Anesthesiology

## 2022-03-27 ENCOUNTER — Encounter (HOSPITAL_COMMUNITY): Payer: Self-pay | Admitting: Neurosurgery

## 2022-03-27 ENCOUNTER — Ambulatory Visit (HOSPITAL_COMMUNITY): Payer: Medicare PPO | Admitting: Physician Assistant

## 2022-03-27 DIAGNOSIS — M8448XA Pathological fracture, other site, initial encounter for fracture: Secondary | ICD-10-CM | POA: Diagnosis not present

## 2022-03-27 DIAGNOSIS — Z981 Arthrodesis status: Secondary | ICD-10-CM | POA: Diagnosis not present

## 2022-03-27 DIAGNOSIS — Z953 Presence of xenogenic heart valve: Secondary | ICD-10-CM | POA: Insufficient documentation

## 2022-03-27 DIAGNOSIS — I10 Essential (primary) hypertension: Secondary | ICD-10-CM

## 2022-03-27 DIAGNOSIS — D649 Anemia, unspecified: Secondary | ICD-10-CM

## 2022-03-27 DIAGNOSIS — M546 Pain in thoracic spine: Secondary | ICD-10-CM | POA: Diagnosis not present

## 2022-03-27 DIAGNOSIS — I2510 Atherosclerotic heart disease of native coronary artery without angina pectoris: Secondary | ICD-10-CM

## 2022-03-27 DIAGNOSIS — G8929 Other chronic pain: Secondary | ICD-10-CM | POA: Diagnosis not present

## 2022-03-27 DIAGNOSIS — F419 Anxiety disorder, unspecified: Secondary | ICD-10-CM

## 2022-03-27 DIAGNOSIS — I517 Cardiomegaly: Secondary | ICD-10-CM | POA: Diagnosis not present

## 2022-03-27 DIAGNOSIS — I119 Hypertensive heart disease without heart failure: Secondary | ICD-10-CM | POA: Diagnosis not present

## 2022-03-27 DIAGNOSIS — Z9889 Other specified postprocedural states: Secondary | ICD-10-CM | POA: Insufficient documentation

## 2022-03-27 DIAGNOSIS — I739 Peripheral vascular disease, unspecified: Secondary | ICD-10-CM | POA: Diagnosis not present

## 2022-03-27 DIAGNOSIS — M4854XA Collapsed vertebra, not elsewhere classified, thoracic region, initial encounter for fracture: Secondary | ICD-10-CM | POA: Insufficient documentation

## 2022-03-27 DIAGNOSIS — M8580 Other specified disorders of bone density and structure, unspecified site: Secondary | ICD-10-CM | POA: Diagnosis not present

## 2022-03-27 DIAGNOSIS — M8458XA Pathological fracture in neoplastic disease, other specified site, initial encounter for fracture: Secondary | ICD-10-CM | POA: Diagnosis not present

## 2022-03-27 HISTORY — PX: KYPHOPLASTY: SHX5884

## 2022-03-27 SURGERY — KYPHOPLASTY
Anesthesia: General | Site: Spine Thoracic | Laterality: Bilateral

## 2022-03-27 MED ORDER — LIDOCAINE 2% (20 MG/ML) 5 ML SYRINGE
INTRAMUSCULAR | Status: DC | PRN
  Administered 2022-03-27: 50 mg via INTRAVENOUS

## 2022-03-27 MED ORDER — IOPAMIDOL (ISOVUE-300) INJECTION 61%
INTRAVENOUS | Status: DC | PRN
  Administered 2022-03-27: 100 mL

## 2022-03-27 MED ORDER — DEXAMETHASONE SODIUM PHOSPHATE 10 MG/ML IJ SOLN
INTRAMUSCULAR | Status: DC | PRN
  Administered 2022-03-27: 10 mg via INTRAVENOUS

## 2022-03-27 MED ORDER — ROCURONIUM BROMIDE 10 MG/ML (PF) SYRINGE
PREFILLED_SYRINGE | INTRAVENOUS | Status: DC | PRN
  Administered 2022-03-27: 50 mg via INTRAVENOUS

## 2022-03-27 MED ORDER — PROPOFOL 10 MG/ML IV BOLUS
INTRAVENOUS | Status: AC
  Filled 2022-03-27: qty 20

## 2022-03-27 MED ORDER — LACTATED RINGERS IV SOLN: INTRAVENOUS | Status: DC

## 2022-03-27 MED ORDER — BUPIVACAINE HCL (PF) 0.5 % IJ SOLN
INTRAMUSCULAR | Status: AC
  Filled 2022-03-27: qty 30

## 2022-03-27 MED ORDER — EPHEDRINE 5 MG/ML INJ
INTRAVENOUS | Status: AC
  Filled 2022-03-27: qty 5

## 2022-03-27 MED ORDER — OXYCODONE HCL 5 MG/5ML PO SOLN: 5.0000 mg | Freq: Once | ORAL | Status: DC | PRN

## 2022-03-27 MED ORDER — FENTANYL CITRATE (PF) 100 MCG/2ML IJ SOLN: 25.0000 ug | INTRAMUSCULAR | Status: DC | PRN

## 2022-03-27 MED ORDER — DEXAMETHASONE SODIUM PHOSPHATE 10 MG/ML IJ SOLN
INTRAMUSCULAR | Status: AC
  Filled 2022-03-27: qty 1

## 2022-03-27 MED ORDER — FENTANYL CITRATE (PF) 250 MCG/5ML IJ SOLN
INTRAMUSCULAR | Status: AC
  Filled 2022-03-27: qty 5

## 2022-03-27 MED ORDER — ACETAMINOPHEN 500 MG PO TABS
1000.0000 mg | ORAL_TABLET | Freq: Once | ORAL | Status: AC
  Administered 2022-03-27: 1000 mg via ORAL

## 2022-03-27 MED ORDER — VANCOMYCIN HCL IN DEXTROSE 1-5 GM/200ML-% IV SOLN
1000.0000 mg | INTRAVENOUS | Status: AC
  Administered 2022-03-27: 1000 mg via INTRAVENOUS

## 2022-03-27 MED ORDER — OXYCODONE HCL 5 MG PO TABS: 5.0000 mg | ORAL_TABLET | Freq: Four times a day (QID) | ORAL | 0 refills | Status: AC | PRN

## 2022-03-27 MED ORDER — 0.9 % SODIUM CHLORIDE (POUR BTL) OPTIME
TOPICAL | Status: DC | PRN
  Administered 2022-03-27: 1000 mL

## 2022-03-27 MED ORDER — SUGAMMADEX SODIUM 200 MG/2ML IV SOLN
INTRAVENOUS | Status: DC | PRN
  Administered 2022-03-27: 200 mg via INTRAVENOUS
  Administered 2022-03-27: 100 mg via INTRAVENOUS

## 2022-03-27 MED ORDER — LIDOCAINE-EPINEPHRINE 1 %-1:100000 IJ SOLN
INTRAMUSCULAR | Status: AC
  Filled 2022-03-27: qty 1

## 2022-03-27 MED ORDER — CHLORHEXIDINE GLUCONATE 0.12 % MT SOLN
15.0000 mL | Freq: Once | OROMUCOSAL | Status: AC
  Administered 2022-03-27: 15 mL via OROMUCOSAL

## 2022-03-27 MED ORDER — PHENYLEPHRINE 80 MCG/ML (10ML) SYRINGE FOR IV PUSH (FOR BLOOD PRESSURE SUPPORT)
PREFILLED_SYRINGE | INTRAVENOUS | Status: AC
  Filled 2022-03-27: qty 10

## 2022-03-27 MED ORDER — ONDANSETRON HCL 4 MG/2ML IJ SOLN: 4.0000 mg | Freq: Once | INTRAMUSCULAR | Status: DC | PRN

## 2022-03-27 MED ORDER — OXYCODONE HCL 5 MG PO TABS: 5.0000 mg | ORAL_TABLET | Freq: Once | ORAL | Status: DC | PRN

## 2022-03-27 MED ORDER — ROCURONIUM BROMIDE 10 MG/ML (PF) SYRINGE
PREFILLED_SYRINGE | INTRAVENOUS | Status: AC
  Filled 2022-03-27: qty 10

## 2022-03-27 MED ORDER — CHLORHEXIDINE GLUCONATE CLOTH 2 % EX PADS: 6.0000 | MEDICATED_PAD | Freq: Once | CUTANEOUS | Status: DC

## 2022-03-27 MED ORDER — ORAL CARE MOUTH RINSE: 15.0000 mL | Freq: Once | OROMUCOSAL | Status: AC

## 2022-03-27 MED ORDER — FENTANYL CITRATE (PF) 250 MCG/5ML IJ SOLN
INTRAMUSCULAR | Status: DC | PRN
  Administered 2022-03-27 (×3): 50 ug via INTRAVENOUS

## 2022-03-27 MED ORDER — PROPOFOL 10 MG/ML IV BOLUS
INTRAVENOUS | Status: DC | PRN
  Administered 2022-03-27: 120 mg via INTRAVENOUS
  Administered 2022-03-27: 30 mg via INTRAVENOUS

## 2022-03-27 MED ORDER — ONDANSETRON HCL 4 MG/2ML IJ SOLN
INTRAMUSCULAR | Status: DC | PRN
  Administered 2022-03-27: 4 mg via INTRAVENOUS

## 2022-03-27 MED ORDER — PHENYLEPHRINE 80 MCG/ML (10ML) SYRINGE FOR IV PUSH (FOR BLOOD PRESSURE SUPPORT)
PREFILLED_SYRINGE | INTRAVENOUS | Status: DC | PRN
  Administered 2022-03-27: 40 ug via INTRAVENOUS
  Administered 2022-03-27: 80 ug via INTRAVENOUS

## 2022-03-27 MED ORDER — LIDOCAINE-EPINEPHRINE 1 %-1:100000 IJ SOLN
INTRAMUSCULAR | Status: DC | PRN
  Administered 2022-03-27: 2 mL

## 2022-03-27 MED ORDER — EPHEDRINE SULFATE-NACL 50-0.9 MG/10ML-% IV SOSY
PREFILLED_SYRINGE | INTRAVENOUS | Status: DC | PRN
  Administered 2022-03-27: 5 mg via INTRAVENOUS
  Administered 2022-03-27: 2.5 mg via INTRAVENOUS

## 2022-03-27 MED ORDER — ONDANSETRON HCL 4 MG/2ML IJ SOLN
INTRAMUSCULAR | Status: AC
  Filled 2022-03-27: qty 2

## 2022-03-27 MED ORDER — BUPIVACAINE HCL (PF) 0.5 % IJ SOLN
INTRAMUSCULAR | Status: DC | PRN
  Administered 2022-03-27: 2 mL

## 2022-03-27 MED ORDER — LIDOCAINE 2% (20 MG/ML) 5 ML SYRINGE
INTRAMUSCULAR | Status: AC
  Filled 2022-03-27: qty 5

## 2022-03-27 SURGICAL SUPPLY — 40 items
ADH SKN CLS APL DERMABOND .7 (GAUZE/BANDAGES/DRESSINGS) ×1
BAG COUNTER SPONGE SURGICOUNT (BAG) ×1 IMPLANT
BAG SPNG CNTER NS LX DISP (BAG) ×1
BLADE CLIPPER SURG (BLADE) IMPLANT
BLADE SURG 15 STRL LF DISP TIS (BLADE) ×1 IMPLANT
BLADE SURG 15 STRL SS (BLADE) ×1
CEMENT KYPHON CX01A KIT/MIXER (Cement) IMPLANT
DERMABOND ADVANCED .7 DNX12 (GAUZE/BANDAGES/DRESSINGS) ×1 IMPLANT
DEVICE BIOPSY BONE KYPHX (INSTRUMENTS) IMPLANT
DRAPE C-ARM 42X72 X-RAY (DRAPES) ×1 IMPLANT
DRAPE HALF SHEET 40X57 (DRAPES) ×1 IMPLANT
DRAPE INCISE IOBAN 66X45 STRL (DRAPES) ×1 IMPLANT
DRAPE LAPAROTOMY 100X72X124 (DRAPES) ×1 IMPLANT
DRAPE SURG 17X23 STRL (DRAPES) ×1 IMPLANT
DRAPE WARM FLUID 44X44 (DRAPES) ×1 IMPLANT
DURAPREP 26ML APPLICATOR (WOUND CARE) ×1 IMPLANT
GAUZE 4X4 16PLY ~~LOC~~+RFID DBL (SPONGE) ×1 IMPLANT
GLOVE BIO SURGEON STRL SZ7.5 (GLOVE) IMPLANT
GLOVE BIOGEL PI IND STRL 7.5 (GLOVE) ×2 IMPLANT
GLOVE ECLIPSE 7.0 STRL STRAW (GLOVE) ×1 IMPLANT
GLOVE EXAM NITRILE XL STR (GLOVE) IMPLANT
GOWN STRL REUS W/ TWL LRG LVL3 (GOWN DISPOSABLE) ×2 IMPLANT
GOWN STRL REUS W/ TWL XL LVL3 (GOWN DISPOSABLE) IMPLANT
GOWN STRL REUS W/TWL 2XL LVL3 (GOWN DISPOSABLE) IMPLANT
GOWN STRL REUS W/TWL LRG LVL3 (GOWN DISPOSABLE) ×2
GOWN STRL REUS W/TWL XL LVL3 (GOWN DISPOSABLE)
KIT BASIN OR (CUSTOM PROCEDURE TRAY) ×1 IMPLANT
KIT TURNOVER KIT B (KITS) ×1 IMPLANT
NDL HYPO 25X1 1.5 SAFETY (NEEDLE) ×1 IMPLANT
NEEDLE HYPO 25X1 1.5 SAFETY (NEEDLE) ×1 IMPLANT
NS IRRIG 1000ML POUR BTL (IV SOLUTION) ×1 IMPLANT
PACK BASIC III (CUSTOM PROCEDURE TRAY) ×1
PACK SRG BSC III STRL LF ECLPS (CUSTOM PROCEDURE TRAY) ×1 IMPLANT
PAD ARMBOARD 7.5X6 YLW CONV (MISCELLANEOUS) ×3 IMPLANT
SPECIMEN JAR SMALL (MISCELLANEOUS) IMPLANT
SUT VICRYL 3-0 RB1 18 ABS (SUTURE) ×1 IMPLANT
SYR CONTROL 10ML LL (SYRINGE) ×2 IMPLANT
TOWEL GREEN STERILE (TOWEL DISPOSABLE) ×1 IMPLANT
TOWEL GREEN STERILE FF (TOWEL DISPOSABLE) ×1 IMPLANT
TRAY KYPHOPAK 15/3 ONESTEP 1ST (MISCELLANEOUS) IMPLANT

## 2022-03-27 NOTE — Op Note (Signed)
  NEUROSURGERY OPERATIVE NOTE   PREOP DIAGNOSIS: T11 pathologic compression fx   POSTOP DIAGNOSIS: Same  PROCEDURE: 1. T11 biopsy, T11 Kyphoplasty  SURGEON: Dr. Consuella Lose, MD  ASSISTANT: None  ANESTHESIA: GETA  EBL: Minimal  SPECIMENS: None  DRAINS: None  COMPLICATIONS: None immediate  CONDITION: Hemodynamically stable to PCAU  HISTORY: [name] is a 81 y.o. yo female with thoracic back pain and imaging revealing worsening T11 lesion with associated fracture. Her case was discussed at multidisciplinary tumor conference and concensus opinion was to proceed with biopsy and kyphoplasty. Risks, benefits, and alternatives were all reviewed with the patient and her family. After all questions were answered, informed consent was obtained and witnessed.  PROCEDURE IN DETAIL: After informed consent was obtained and witnessed, the patient was brought to the operating room. After induction of anesthesia, the patient was positioned on the operative table in the prone position. All pressure points were meticulously padded. Fluoroscopy was used to mark out the projection of the right T11 pedicle on the skin. Skin incision was then marked out and prepped and draped in the usual sterile fashion.  After time out was conducted, right sided stab incisions was made and Jamshidi needle was introduced. Under AP and lateral fluoroscopic guidance, right T11 pedicles were canulated. Biopsy was taken of the anterior aspect of the vertebral body using two different needle trajectories. Drill was then used to create a channel and the kyphoplasty balloon was placed and expanded to create a cavity. Approximately 3.5cc of PMMA cement was injected under fluoroscopy, taking care to preserve the posterior cortex. Trocar was replaced and the Jamshidi needles removed.  Stab incision was then closed with 3-0 vicryl suture and standard skin glue. The patient was then transferred to the stretcher and taken to  the PACU in stable hemodynamic condition.  At the end of the case all sponge, needle, and instrument counts were correct.   Consuella Lose, MD [facility]

## 2022-03-27 NOTE — H&P (Signed)
Chief Complaint   Back pain  History of Present Illness    [name] is a 81 year old woman seen for initial consultation at the request of Dr. Isidore Moos. She is referred for evaluation of relatively severe low thoracic back pain. She reports symptoms started a few months ago although were not terribly severe at the time. She underwent resection of a thymoma which was largely uncomplicated. Unfortunately, she had some worsening of her midthoracic back pain a few weeks ago and had sudden severe worsening this past Monday. She was already scheduled for an MRI of the thoracic spine because of the chronic pain which was completed this past Thursday. Unfortunately the pain became so severe she was actually seen in the emergency department where she was started on some oxycodone which seems to be providing some improvement to the point where it is now manageable. She describes pain in the lower part of her thoracic spine slightly eccentric to the right, with some radiation around the right flank. She does not report left-sided symptoms. No radiation down into the lower extremities.Of note, the patient does again have history of thymoma resected a few months ago. She also has a history of aortic valve replacement several years ago. She does report a history of hypertension. No other lung, liver, or kidney disease. Aside from the thymoma resection, no other oncologic history. She is not on any blood thinners or antiplatelet agents. She is a nonsmoker.    Past Medical History   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 by echo 10/2021   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/2015   Carotid artery stenosis    < 50% stenosis bilaterally by doppler 07/2016   Coronary artery  disease 2008   Coronary Ca score of 331 with minimal multivessel plaque < 25% stenosis by coronary CTA 8/23   Heart murmur    per pt   Hypercholesteremia    Hypertension    Obesity    Osteopenia    declines treatment   Pneumonia 1995   Pulmonary HTN ([place])    mild to moderate by echo 7/23 with PASP 46mmHg   Shoulder pain    Due to arthritis   Vitamin D insufficiency     Past Surgical History   Past Surgical History:  Procedure Laterality Date   AORTIC VALVE REPLACEMENT N/A 10/14/2013   Procedure: AORTIC VALVE REPLACEMENT (AVR);  Surgeon: Gaye Pollack, MD;  Location: [facility];  Service: Open Heart Surgery;  Laterality: N/A;   CARDIAC CATHETERIZATION     CATARACT EXTRACTION, BILATERAL     CHOLECYSTECTOMY  04/08/1988   INTRAOPERATIVE TRANSESOPHAGEAL ECHOCARDIOGRAM N/A 10/14/2013   Procedure: INTRAOPERATIVE TRANSESOPHAGEAL ECHOCARDIOGRAM;  Surgeon: Gaye Pollack, MD;  Location: [facility];  Service: Open Heart Surgery;  Laterality: N/A;   LEFT AND RIGHT HEART CATHETERIZATION WITH CORONARY ANGIOGRAM N/A 09/16/2013   Procedure: LEFT AND RIGHT HEART CATHETERIZATION WITH CORONARY ANGIOGRAM;  Surgeon: Sueanne Margarita, MD;  Location: [facility];  Service: Cardiovascular;  Laterality: N/A;   TONSILLECTOMY      Social History   Social History   Tobacco Use   Smoking status: Never   Smokeless tobacco: Never  Vaping Use   Vaping  Use: Never used  Substance Use Topics   Alcohol use: No   Drug use: No    Medications   Prior to Admission medications   Medication Sig Start Date End Date Taking? Authorizing Provider  acetaminophen (TYLENOL) 500 MG tablet Take 1-2 tablets (500-1,000 mg total) by mouth every 6 (six) hours as needed. 01/29/22  Yes Barrett, Erin R, PA-C  carboxymethylcellulose (REFRESH PLUS) 0.5 % SOLN Place 1 drop into both eyes daily as needed (dry/irritated eyes).   Yes [provider]  hydrochlorothiazide (MICROZIDE) 12.5 MG capsule Take 1 capsule (12.5 mg total)  by mouth daily. 11/19/21  Yes Turner, Eber Hong, MD  ibuprofen (ADVIL) 600 MG tablet Take 1 tablet (600 mg total) by mouth every 6 (six) hours as needed. 03/15/22  Yes Wynona Dove A, DO  lisinopril (ZESTRIL) 10 MG tablet Take 1 tablet by mouth once daily 11/13/21  Yes Turner, Eber Hong, MD  ondansetron (ZOFRAN) 4 MG tablet Take 1 tablet (4 mg total) by mouth every 4 (four) hours as needed for nausea or vomiting. 03/15/22  Yes Wynona Dove A, DO  oxyCODONE (ROXICODONE) 5 MG immediate release tablet Take 1 tablet (5 mg total) by mouth every 4 (four) hours as needed for severe pain. 03/15/22  Yes Jeanell Sparrow, DO  acetaminophen (TYLENOL) 325 MG tablet Take 2 tablets (650 mg total) by mouth every 6 (six) hours as needed. Patient not taking: Reported on 03/21/2022 03/15/22   Jeanell Sparrow, DO  Evolocumab (REPATHA SURECLICK) 782 MG/ML SOAJ Inject 1 Pen into the skin every 14 (fourteen) days. Patient not taking: Reported on 03/21/2022 10/15/21   Sueanne Margarita, MD  lidocaine (LIDODERM) 5 % Place 1 patch onto the skin daily as needed. Remove & Discard patch within 12 hours or as directed by MD 03/15/22   Jeanell Sparrow, DO    Allergies   Allergies  Allergen Reactions   Crestor [Rosuvastatin Calcium] Other (See Comments)    Myalgias with 5mg  daily dose   Lipitor [Atorvastatin] Other (See Comments)    Stomach pain   Pravastatin Other (See Comments)    Myalgias    Simvastatin Other (See Comments)    Myalgias    Tramadol Other (See Comments)    FEELS LOOPY   Zetia [Ezetimibe]     Myalgias   Codeine Palpitations   Penicillins Palpitations   Sulfa Antibiotics Palpitations   Vancomycin Itching and Other (See Comments)    Pt developed flushing and itchy scalp greater than 210ml into the infusion.  RedMan syndrone developed.  Will need slower admin rate for future infusions.    Review of Systems  ROS  Neurologic Exam  Awake, alert, oriented Memory and concentration grossly intact Speech fluent,  appropriate CN grossly intact Motor exam: Upper Extremities Deltoid Bicep Tricep Grip  Right 5/5 5/5 5/5 5/5  Left 5/5 5/5 5/5 5/5   Lower Extremities IP Quad PF DF EHL  Right 5/5 5/5 5/5 5/5 5/5  Left 5/5 5/5 5/5 5/5 5/5   Sensation grossly intact to LT  Imaging  Anterior T11 mass with associated compression deformity of T11  Impression  - 81 y.o. female with pathologic T11 compression fracture, symptomatic and suspicious for underlying malignancy  Plan  - Will proceed with T11 biopsy and kyphoplasty  I have reviewed the indications for the procedure as well as the details of the procedure and the expected postoperative course and recovery at length with the patient in the office. We have also  reviewed in detail the risks, benefits, and alternatives to the procedure. All questions were answered and [name] provided informed consent to proceed.  [name] Lose, MD [facility]

## 2022-03-27 NOTE — Anesthesia Postprocedure Evaluation (Signed)
Anesthesia Post Note  Patient: [name]  Procedure(s) Performed: KYPHOPLASTY AND BIOPSY THORACIC ELEVEN (Bilateral: Spine Thoracic)     Patient location during evaluation: PACU Anesthesia Type: General Level of consciousness: awake and alert Pain management: pain level controlled Vital Signs Assessment: post-procedure vital signs reviewed and stable Respiratory status: spontaneous breathing, nonlabored ventilation and respiratory function stable Cardiovascular status: stable and blood pressure returned to baseline Anesthetic complications: no   No notable events documented.  Last Vitals:  Vitals:   03/27/22 1030 03/27/22 1045  BP: (!) 155/57 (!) 168/56  Pulse: (!) 55 (!) 56  Resp: 12 15  Temp:  36.4 C  SpO2: 97% 97%    Last Pain:  Vitals:   03/27/22 1045  TempSrc:   PainSc: 0-No pain                 Audry Pili

## 2022-03-27 NOTE — Discharge Summary (Signed)
Physician Discharge Summary  Patient ID: [name] MRN: [id number] DOB/AGE: February 16, 1941 81 y.o.  Admit date: 03/27/2022 Discharge date: 03/27/2022  Admission Diagnoses:  Pathologic T11 fracture  Discharge Diagnoses:  Same Active Problems:   * No active hospital problems. *   Discharged Condition: Stable  Hospital Course:  [name] is a 81 y.o. female who underwent uncomplicated D66 biopsy/kyphoplasty. She was discharged from PACU in stable condition  Treatments: Surgery - T11 biopsy/kyphoplasty  Discharge Exam: Blood pressure (!) 155/57, pulse (!) 55, temperature 97.6 F (36.4 C), resp. rate 12, height 5\' 1"  (1.549 m), weight 70.8 kg, SpO2 97 %. Awake, alert, oriented Speech fluent, appropriate CN grossly intact 5/5 BUE/BLE Wound c/d/i  Disposition: Discharge disposition: 01-Home or Self Care       Discharge Instructions     Call MD for:  redness, tenderness, or signs of infection (pain, swelling, redness, odor or green/yellow discharge around incision site)   Complete by: As directed    Call MD for:  temperature >100.4   Complete by: As directed    Diet - low sodium heart healthy   Complete by: As directed    Discharge instructions   Complete by: As directed    Walk at home as much as possible, at least 4 times / day   Increase activity slowly   Complete by: As directed    Lifting restrictions   Complete by: As directed    No lifting > 10 lbs   May shower / Bathe   Complete by: As directed    48 hours after surgery   May walk up steps   Complete by: As directed    No dressing needed   Complete by: As directed    Other Restrictions   Complete by: As directed    No bending/twisting at waist      Allergies as of 03/27/2022       Reactions   Crestor [rosuvastatin Calcium] Other (See Comments)   Myalgias with 5mg  daily dose   Lipitor [atorvastatin] Other (See Comments)   Stomach pain   Pravastatin Other (See Comments)   Myalgias    Simvastatin Other (See Comments)   Myalgias   Tramadol Other (See Comments)   FEELS LOOPY   Zetia [ezetimibe]    Myalgias   Codeine Palpitations   Penicillins Palpitations   Sulfa Antibiotics Palpitations   Vancomycin Itching, Other (See Comments)   Pt developed flushing and itchy scalp greater than 272ml into the infusion.  RedMan syndrone developed.  Will need slower admin rate for future infusions.        Medication List     TAKE these medications    acetaminophen 500 MG tablet Commonly known as: TYLENOL Take 1-2 tablets (500-1,000 mg total) by mouth every 6 (six) hours as needed.   acetaminophen 325 MG tablet Commonly known as: Tylenol Take 2 tablets (650 mg total) by mouth every 6 (six) hours as needed.   carboxymethylcellulose 0.5 % Soln Commonly known as: REFRESH PLUS Place 1 drop into both eyes daily as needed (dry/irritated eyes).   hydrochlorothiazide 12.5 MG capsule Commonly known as: MICROZIDE Take 1 capsule (12.5 mg total) by mouth daily.   ibuprofen 600 MG tablet Commonly known as: ADVIL Take 1 tablet (600 mg total) by mouth every 6 (six) hours as needed.   lidocaine 5 % Commonly known as: Lidoderm Place 1 patch onto the skin daily as needed. Remove & Discard patch within 12 hours or as directed  by MD   lisinopril 10 MG tablet Commonly known as: ZESTRIL Take 1 tablet by mouth once daily   ondansetron 4 MG tablet Commonly known as: ZOFRAN Take 1 tablet (4 mg total) by mouth every 4 (four) hours as needed for nausea or vomiting.   oxyCODONE 5 MG immediate release tablet Commonly known as: Roxicodone Take 1 tablet (5 mg total) by mouth every 6 (six) hours as needed for up to 3 days for severe pain. What changed: when to take this   Repatha SureClick 970 MG/ML Soaj Generic drug: Evolocumab Inject 1 Pen into the skin every 14 (fourteen) days.               Discharge Care Instructions  (From admission, onward)           Start      Ordered   03/27/22 0000  No dressing needed        03/27/22 1035            Follow-up Information     Consuella Lose, MD Follow up.   Specialty: Neurosurgery Contact information: [address] [place] [address] [phone]                 Signed: Jairo Ben 03/27/2022, 10:36 AM

## 2022-03-27 NOTE — Anesthesia Procedure Notes (Signed)
Procedure Name: Intubation Date/Time: 03/27/2022 9:06 AM  Performed by: Betha Loa, CRNAPre-anesthesia Checklist: Patient identified, Emergency Drugs available, Suction available and Patient being monitored Patient Re-evaluated:Patient Re-evaluated prior to induction Oxygen Delivery Method: Circle System Utilized Preoxygenation: Pre-oxygenation with 100% oxygen Induction Type: IV induction Ventilation: Mask ventilation without difficulty Laryngoscope Size: Mac and 3 Grade View: Grade I Tube type: Oral Tube size: 7.0 mm Number of attempts: 1 Airway Equipment and Method: Stylet and Oral airway Placement Confirmation: ETT inserted through vocal cords under direct vision, positive ETCO2 and breath sounds checked- equal and bilateral Secured at: 21 cm Tube secured with: Tape Dental Injury: Teeth and Oropharynx as per pre-operative assessment

## 2022-03-27 NOTE — Transfer of Care (Signed)
Immediate Anesthesia Transfer of Care Note  Patient: [name]  Procedure(s) Performed: KYPHOPLASTY AND BIOPSY THORACIC ELEVEN (Bilateral: Spine Thoracic)  Patient Location: PACU  Anesthesia Type:General  Level of Consciousness: awake, patient cooperative, and responds to stimulation  Airway & Oxygen Therapy: Patient Spontanous Breathing  Post-op Assessment: Report given to RN and Post -op Vital signs reviewed and stable  Post vital signs: Reviewed and stable  Last Vitals:  Vitals Value Taken Time  BP 151/54 03/27/22 1007  Temp 36.4 C 03/27/22 1007  Pulse 67 03/27/22 1011  Resp 15 03/27/22 1011  SpO2 99 % 03/27/22 1011  Vitals shown include unvalidated device data.  Last Pain:  Vitals:   03/27/22 0707  TempSrc:   PainSc: 4       Patients Stated Pain Goal: 0 (65/78/46 9629)  Complications: No notable events documented.

## 2022-03-28 ENCOUNTER — Encounter (HOSPITAL_COMMUNITY): Payer: Self-pay | Admitting: Neurosurgery

## 2022-03-29 LAB — SURGICAL PATHOLOGY

## 2022-04-03 ENCOUNTER — Other Ambulatory Visit: Payer: Self-pay | Admitting: Radiation Therapy

## 2022-04-09 NOTE — Telephone Encounter (Signed)
I s/w the pt to follow up about Itamar. See previous notes. Pt states she is not going to do the Itamar sleep study. I will make note of this and cancel the order.

## 2022-04-10 ENCOUNTER — Telehealth: Payer: Self-pay | Admitting: *Deleted

## 2022-04-10 NOTE — Telephone Encounter (Signed)
[name] notified the patient has not completed the itamar that was given to her back in July.

## 2022-04-15 ENCOUNTER — Inpatient Hospital Stay: Payer: Medicare PPO | Attending: Internal Medicine

## 2022-04-18 ENCOUNTER — Other Ambulatory Visit (HOSPITAL_COMMUNITY): Payer: Self-pay

## 2022-04-19 DIAGNOSIS — H26493 Other secondary cataract, bilateral: Secondary | ICD-10-CM | POA: Diagnosis not present

## 2022-04-19 DIAGNOSIS — H04123 Dry eye syndrome of bilateral lacrimal glands: Secondary | ICD-10-CM | POA: Diagnosis not present

## 2022-04-19 DIAGNOSIS — H353131 Nonexudative age-related macular degeneration, bilateral, early dry stage: Secondary | ICD-10-CM | POA: Diagnosis not present

## 2022-04-19 DIAGNOSIS — H35361 Drusen (degenerative) of macula, right eye: Secondary | ICD-10-CM | POA: Diagnosis not present

## 2022-04-24 DIAGNOSIS — M8448XD Pathological fracture, other site, subsequent encounter for fracture with routine healing: Secondary | ICD-10-CM | POA: Diagnosis not present

## 2022-05-15 DIAGNOSIS — Z0000 Encounter for general adult medical examination without abnormal findings: Secondary | ICD-10-CM | POA: Diagnosis not present

## 2022-05-15 DIAGNOSIS — I6523 Occlusion and stenosis of bilateral carotid arteries: Secondary | ICD-10-CM | POA: Diagnosis not present

## 2022-05-15 DIAGNOSIS — E559 Vitamin D deficiency, unspecified: Secondary | ICD-10-CM | POA: Diagnosis not present

## 2022-05-15 DIAGNOSIS — Z1331 Encounter for screening for depression: Secondary | ICD-10-CM | POA: Diagnosis not present

## 2022-05-15 DIAGNOSIS — D649 Anemia, unspecified: Secondary | ICD-10-CM | POA: Diagnosis not present

## 2022-05-15 DIAGNOSIS — E7800 Pure hypercholesterolemia, unspecified: Secondary | ICD-10-CM | POA: Diagnosis not present

## 2022-05-15 DIAGNOSIS — I10 Essential (primary) hypertension: Secondary | ICD-10-CM | POA: Diagnosis not present

## 2022-05-15 DIAGNOSIS — I2510 Atherosclerotic heart disease of native coronary artery without angina pectoris: Secondary | ICD-10-CM | POA: Diagnosis not present

## 2022-05-15 DIAGNOSIS — Z87898 Personal history of other specified conditions: Secondary | ICD-10-CM | POA: Diagnosis not present

## 2022-05-15 DIAGNOSIS — R7303 Prediabetes: Secondary | ICD-10-CM | POA: Diagnosis not present

## 2022-05-15 DIAGNOSIS — M810 Age-related osteoporosis without current pathological fracture: Secondary | ICD-10-CM | POA: Diagnosis not present

## 2022-05-17 ENCOUNTER — Other Ambulatory Visit: Payer: Self-pay | Admitting: Internal Medicine

## 2022-05-17 DIAGNOSIS — M810 Age-related osteoporosis without current pathological fracture: Secondary | ICD-10-CM

## 2022-05-21 ENCOUNTER — Ambulatory Visit
Admission: RE | Admit: 2022-05-21 | Discharge: 2022-05-21 | Disposition: A | Discharge status: Home / Self Care | Payer: Medicare PPO | Source: Ambulatory Visit | Attending: Internal Medicine | Admitting: Internal Medicine

## 2022-05-21 DIAGNOSIS — M810 Age-related osteoporosis without current pathological fracture: Secondary | ICD-10-CM | POA: Diagnosis not present

## 2022-05-21 DIAGNOSIS — Z780 Asymptomatic menopausal state: Secondary | ICD-10-CM | POA: Diagnosis not present

## 2022-05-21 DIAGNOSIS — M85832 Other specified disorders of bone density and structure, left forearm: Secondary | ICD-10-CM | POA: Diagnosis not present

## 2022-06-17 ENCOUNTER — Other Ambulatory Visit: Payer: Self-pay | Admitting: Thoracic Surgery (Cardiothoracic Vascular Surgery)

## 2022-06-17 DIAGNOSIS — Z9089 Acquired absence of other organs: Secondary | ICD-10-CM

## 2022-06-18 ENCOUNTER — Encounter: Payer: Self-pay | Admitting: Thoracic Surgery (Cardiothoracic Vascular Surgery)

## 2022-06-18 ENCOUNTER — Ambulatory Visit
Admission: RE | Admit: 2022-06-18 | Discharge: 2022-06-18 | Disposition: A | Discharge status: Home / Self Care | Payer: Medicare PPO | Source: Ambulatory Visit | Attending: Thoracic Surgery (Cardiothoracic Vascular Surgery) | Admitting: Thoracic Surgery (Cardiothoracic Vascular Surgery)

## 2022-06-18 ENCOUNTER — Ambulatory Visit: Payer: Medicare PPO | Admitting: Thoracic Surgery (Cardiothoracic Vascular Surgery)

## 2022-06-18 VITALS — BP 176/73 | HR 62 | Resp 18 | Ht 61.0 in | Wt 162.0 lb

## 2022-06-18 DIAGNOSIS — M5134 Other intervertebral disc degeneration, thoracic region: Secondary | ICD-10-CM | POA: Diagnosis not present

## 2022-06-18 DIAGNOSIS — Z9089 Acquired absence of other organs: Secondary | ICD-10-CM

## 2022-06-18 DIAGNOSIS — I517 Cardiomegaly: Secondary | ICD-10-CM | POA: Diagnosis not present

## 2022-06-18 DIAGNOSIS — Z9889 Other specified postprocedural states: Secondary | ICD-10-CM | POA: Diagnosis not present

## 2022-06-18 DIAGNOSIS — I700 Atherosclerosis of aorta: Secondary | ICD-10-CM | POA: Diagnosis not present

## 2022-06-18 NOTE — Progress Notes (Signed)
[address]       [place] [address]             [phone]     HPI: [name] returns for a scheduled follow-up visit after thymectomy.  [name] is an 82 year old woman with a past history significant for aortic stenosis, aortic valve replacement, CAD, ascending aneurysm, bradycardia, hypertension, hyperlipidemia, pulmonary hypertension, pneumonia, obesity, osteopenia, and a thymoma.  She has CT for coronary calcium scoring last fall which showed a 3.7 x 2.8 cm anterior mediastinal mass.  It was hypermetabolic on PET/CT.  There also was a lesion in T11.  She later had a kyphoplasty and biopsies of T11 were negative.  I did a robotic assisted thymectomy on 01/28/2022.  She had a grossly complete resection.  Pathology showed type AB thymoma.  There were tumor cells at the inked margin.  She did well postoperatively.  She saw Dr. Julien Nordmann.  No adjuvant chemotherapy was recommended.  He did schedule her for a follow-up scan next November.  She also saw Dr. Isidore Moos but opted not to have adjuvant radiation.  Currently no pain related to her surgical incisions.  Her back and right-sided chest pain is better but has not completely resolved.  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   Carotid artery stenosis    < 50% stenosis bilaterally by doppler 07/2016   Coronary artery disease 2008   Coronary Ca score of 331 with minimal multivessel plaque < 25% stenosis by coronary CTA 8/23   Heart murmur    per pt   Hypercholesteremia    Hypertension    Obesity    Osteopenia    declines treatment   Pneumonia 1995   Pulmonary HTN ([facility]    mild to moderate by echo 7/23 with PASP  418mg   Shoulder pain    Due to arthritis   Vitamin D insufficiency     Current Outpatient Medications  Medication Sig Dispense Refill   carboxymethylcellulose (REFRESH PLUS) 0.5 % SOLN Place 1 drop into both eyes daily as needed (dry/irritated eyes).     hydrochlorothiazide (MICROZIDE) 12.5 MG capsule Take 1 capsule (12.5 mg total) by mouth daily. 90 capsule 3   lisinopril (ZESTRIL) 10 MG tablet Take 1 tablet by mouth once daily 30 tablet 11   acetaminophen (TYLENOL) 325 MG tablet Take 2 tablets (650 mg total) by mouth every 6 (six) hours as needed. (Patient not taking: Reported on 03/21/2022) 36 tablet 0   No current facility-administered medications for this visit.    Physical Exam BP (!) 176/73 (BP Location: Left Arm, Patient Position: Sitting)   Pulse 62   Resp 18   Ht '5\' 1"'$  (1.549 m)   Wt 162 lb (73.5 kg)   SpO2 96% Comment: RA  BMI 30.6126g/m  [age]ear old woman in no acute distress Alert and oriented x 3 with no focal deficits Lungs clear with equal breath sounds bilaterally Cardiac regular rate and rhythm with a 2/6 systolic murmur Incisions well-healed  Diagnostic Tests: CHEST - 2 VIEW   COMPARISON:  Chest radiographs 03/19/2022 and 02/12/2022  FINDINGS: Cardiac silhouette is mildly enlarged. Status post median sternotomy. Moderate calcification within the aortic arch. Aortic valve prosthesis is again seen. Long-term stable mild left costophrenic angle blunting, possible chronic pleural fluid versus pleural scarring. No definite right pleural effusion, with resolution of the prior mild right costophrenic angle blunting on 03/19/2022 most recent radiographs. No focal airspace opacity. Mild multilevel degenerative disc changes of the thoracic spine. New cement vertebroplasty within the anterior T11 vertebral body with unchanged moderate anterior height loss.   Cholecystectomy clips.   IMPRESSION: 1. Stable mild left costophrenic angle blunting, possible  chronic pleural fluid versus pleural scarring. 2. Interval cement vertebroplasty of the prior moderate anterior T11 vertebral body compression fracture.     Electronically Signed   By: Yvonne Kendall M.D.   On: 06/18/2022 15:05   I personally reviewed the chest x-ray images.  No concerning findings.  Impression: [name] is an 82 year old woman with a past history significant for aortic stenosis, aortic valve replacement, CAD, ascending aneurysm, bradycardia, hypertension, hyperlipidemia, pulmonary hypertension, pneumonia, obesity, osteopenia, and a thymoma.  Type AB thymoma-status post resection.  Grossly complete resection but there were some tumor cells at the margin.  She opted not to have adjuvant radiation.  Will be followed by Dr. Julien Nordmann.  Status post thymectomy-doing well from a surgical standpoint.  No pain related to her incisions.  Plan: I will be happy to see [name] back anytime in the future if I can be of any further assistance with her care.  Melrose Nakayama, MD [facility] [phone]

## 2022-07-10 DIAGNOSIS — Z79899 Other long term (current) drug therapy: Secondary | ICD-10-CM | POA: Diagnosis not present

## 2022-07-10 DIAGNOSIS — M549 Dorsalgia, unspecified: Secondary | ICD-10-CM | POA: Diagnosis not present

## 2022-07-10 DIAGNOSIS — D508 Other iron deficiency anemias: Secondary | ICD-10-CM | POA: Diagnosis not present

## 2022-07-10 DIAGNOSIS — M810 Age-related osteoporosis without current pathological fracture: Secondary | ICD-10-CM | POA: Diagnosis not present

## 2022-07-12 DIAGNOSIS — D509 Iron deficiency anemia, unspecified: Secondary | ICD-10-CM | POA: Diagnosis not present

## 2022-07-19 ENCOUNTER — Other Ambulatory Visit: Payer: Self-pay | Admitting: Internal Medicine

## 2022-07-19 ENCOUNTER — Ambulatory Visit
Admission: RE | Admit: 2022-07-19 | Discharge: 2022-07-19 | Disposition: A | Discharge status: Home / Self Care | Payer: Medicare PPO | Source: Ambulatory Visit | Attending: Internal Medicine | Admitting: Internal Medicine

## 2022-07-19 DIAGNOSIS — M549 Dorsalgia, unspecified: Secondary | ICD-10-CM

## 2022-07-19 DIAGNOSIS — M546 Pain in thoracic spine: Secondary | ICD-10-CM | POA: Diagnosis not present

## 2022-07-26 ENCOUNTER — Ambulatory Visit
Admission: RE | Admit: 2022-07-26 | Discharge: 2022-07-26 | Disposition: A | Discharge status: Home / Self Care | Payer: Medicare PPO | Source: Ambulatory Visit | Attending: Internal Medicine | Admitting: Internal Medicine

## 2022-07-26 ENCOUNTER — Other Ambulatory Visit: Payer: Self-pay | Admitting: Internal Medicine

## 2022-07-26 DIAGNOSIS — M549 Dorsalgia, unspecified: Secondary | ICD-10-CM

## 2022-07-26 DIAGNOSIS — M546 Pain in thoracic spine: Secondary | ICD-10-CM | POA: Diagnosis not present

## 2022-07-26 DIAGNOSIS — K429 Umbilical hernia without obstruction or gangrene: Secondary | ICD-10-CM | POA: Diagnosis not present

## 2022-07-26 DIAGNOSIS — R0781 Pleurodynia: Secondary | ICD-10-CM | POA: Diagnosis not present

## 2022-07-30 ENCOUNTER — Emergency Department (HOSPITAL_COMMUNITY): Payer: Medicare PPO

## 2022-07-30 ENCOUNTER — Other Ambulatory Visit: Payer: Self-pay

## 2022-07-30 ENCOUNTER — Encounter (HOSPITAL_COMMUNITY): Payer: Self-pay | Admitting: *Deleted

## 2022-07-30 ENCOUNTER — Emergency Department (HOSPITAL_COMMUNITY)
Admission: EM | Admit: 2022-07-30 | Discharge: 2022-07-30 | Disposition: A | Discharge status: Home / Self Care | Payer: Medicare PPO | Attending: Emergency Medicine | Admitting: Emergency Medicine

## 2022-07-30 DIAGNOSIS — R1011 Right upper quadrant pain: Secondary | ICD-10-CM | POA: Insufficient documentation

## 2022-07-30 DIAGNOSIS — R0602 Shortness of breath: Secondary | ICD-10-CM | POA: Insufficient documentation

## 2022-07-30 DIAGNOSIS — R0789 Other chest pain: Secondary | ICD-10-CM | POA: Diagnosis not present

## 2022-07-30 DIAGNOSIS — E876 Hypokalemia: Secondary | ICD-10-CM | POA: Insufficient documentation

## 2022-07-30 DIAGNOSIS — R0781 Pleurodynia: Secondary | ICD-10-CM | POA: Insufficient documentation

## 2022-07-30 DIAGNOSIS — J9811 Atelectasis: Secondary | ICD-10-CM | POA: Diagnosis not present

## 2022-07-30 DIAGNOSIS — Z9049 Acquired absence of other specified parts of digestive tract: Secondary | ICD-10-CM | POA: Diagnosis not present

## 2022-07-30 DIAGNOSIS — D649 Anemia, unspecified: Secondary | ICD-10-CM | POA: Diagnosis not present

## 2022-07-30 DIAGNOSIS — I2510 Atherosclerotic heart disease of native coronary artery without angina pectoris: Secondary | ICD-10-CM | POA: Diagnosis not present

## 2022-07-30 DIAGNOSIS — R937 Abnormal findings on diagnostic imaging of other parts of musculoskeletal system: Secondary | ICD-10-CM | POA: Diagnosis not present

## 2022-07-30 DIAGNOSIS — Z79899 Other long term (current) drug therapy: Secondary | ICD-10-CM | POA: Diagnosis not present

## 2022-07-30 DIAGNOSIS — R109 Unspecified abdominal pain: Secondary | ICD-10-CM | POA: Diagnosis not present

## 2022-07-30 DIAGNOSIS — I700 Atherosclerosis of aorta: Secondary | ICD-10-CM | POA: Diagnosis not present

## 2022-07-30 DIAGNOSIS — I10 Essential (primary) hypertension: Secondary | ICD-10-CM | POA: Diagnosis not present

## 2022-07-30 DIAGNOSIS — M899 Disorder of bone, unspecified: Secondary | ICD-10-CM

## 2022-07-30 LAB — CBC WITH DIFFERENTIAL/PLATELET
Abs Immature Granulocytes: 0.13 10*3/uL — ABNORMAL HIGH (ref 0.00–0.07)
Basophils Absolute: 0.1 10*3/uL (ref 0.0–0.1)
Basophils Relative: 0 %
Eosinophils Absolute: 0 10*3/uL (ref 0.0–0.5)
Eosinophils Relative: 0 %
HCT: 25.1 % — ABNORMAL LOW (ref 36.0–46.0)
Hemoglobin: 8 g/dL — ABNORMAL LOW (ref 12.0–15.0)
Immature Granulocytes: 1 %
Lymphocytes Relative: 28 %
Lymphs Abs: 3.5 10*3/uL (ref 0.7–4.0)
MCH: 31.4 pg (ref 26.0–34.0)
MCHC: 31.9 g/dL (ref 30.0–36.0)
MCV: 98.4 fL (ref 80.0–100.0)
Monocytes Absolute: 1.2 10*3/uL — ABNORMAL HIGH (ref 0.1–1.0)
Monocytes Relative: 10 %
Neutro Abs: 7.6 10*3/uL (ref 1.7–7.7)
Neutrophils Relative %: 61 %
Platelets: 333 10*3/uL (ref 150–400)
RBC: 2.55 MIL/uL — ABNORMAL LOW (ref 3.87–5.11)
RDW: 14 % (ref 11.5–15.5)
WBC: 12.5 10*3/uL — ABNORMAL HIGH (ref 4.0–10.5)
nRBC: 0 % (ref 0.0–0.2)

## 2022-07-30 LAB — COMPREHENSIVE METABOLIC PANEL
ALT: 18 U/L (ref 0–44)
AST: 22 U/L (ref 15–41)
Albumin: 2.1 g/dL — ABNORMAL LOW (ref 3.5–5.0)
Alkaline Phosphatase: 94 U/L (ref 38–126)
Anion gap: 3 — ABNORMAL LOW (ref 5–15)
BUN: 24 mg/dL — ABNORMAL HIGH (ref 8–23)
CO2: 28 mmol/L (ref 22–32)
Calcium: 8.8 mg/dL — ABNORMAL LOW (ref 8.9–10.3)
Chloride: 101 mmol/L (ref 98–111)
Creatinine, Ser: 1.01 mg/dL — ABNORMAL HIGH (ref 0.44–1.00)
GFR, Estimated: 55 mL/min — ABNORMAL LOW (ref >60)
Glucose, Bld: 109 mg/dL — ABNORMAL HIGH (ref 70–99)
Potassium: 2.9 mmol/L — ABNORMAL LOW (ref 3.5–5.1)
Sodium: 132 mmol/L — ABNORMAL LOW (ref 135–145)
Total Bilirubin: 0.5 mg/dL (ref 0.3–1.2)
Total Protein: 10.1 g/dL — ABNORMAL HIGH (ref 6.5–8.1)

## 2022-07-30 LAB — URINALYSIS, ROUTINE W REFLEX MICROSCOPIC
Bilirubin Urine: NEGATIVE
Glucose, UA: NEGATIVE mg/dL
Ketones, ur: NEGATIVE mg/dL
Leukocytes,Ua: NEGATIVE
Nitrite: NEGATIVE
Protein, ur: NEGATIVE mg/dL
Specific Gravity, Urine: 1.015 (ref 1.005–1.030)
pH: 5 (ref 5.0–8.0)

## 2022-07-30 LAB — POC OCCULT BLOOD, ED: Fecal Occult Bld: NEGATIVE

## 2022-07-30 LAB — BRAIN NATRIURETIC PEPTIDE: B Natriuretic Peptide: 210.4 pg/mL — ABNORMAL HIGH (ref 0.0–100.0)

## 2022-07-30 LAB — LIPASE, BLOOD: Lipase: 40 U/L (ref 11–51)

## 2022-07-30 MED ORDER — IOHEXOL 350 MG/ML SOLN
80.0000 mL | Freq: Once | INTRAVENOUS | Status: AC | PRN
  Administered 2022-07-30: 80 mL via INTRAVENOUS

## 2022-07-30 MED ORDER — LIDOCAINE 5 % EX PTCH
1.0000 | MEDICATED_PATCH | CUTANEOUS | Status: DC
  Administered 2022-07-30: 1 via TRANSDERMAL
  Filled 2022-07-30: qty 1

## 2022-07-30 MED ORDER — HYDROCODONE-ACETAMINOPHEN 5-325 MG PO TABS
1.0000 | ORAL_TABLET | Freq: Once | ORAL | Status: AC
  Administered 2022-07-30: 1 via ORAL
  Filled 2022-07-30 (×2): qty 1

## 2022-07-30 MED ORDER — HYDROCODONE-ACETAMINOPHEN 5-325 MG PO TABS: 1.0000 | ORAL_TABLET | Freq: Four times a day (QID) | ORAL | 0 refills | Status: DC | PRN

## 2022-07-30 NOTE — Discharge Instructions (Signed)
So today your blood counts are low and I want you to continue the iron for the time being until you follow-up with Dr. Shirline Frees.  You were given a prescription for pain medication that you can use as needed when the pain is severe but make sure if you are taking it regularly you use a stool softener or something to prevent you from becoming constipated.  You do have multiple lesions in your bones today which is why you are having the pain but they are going to need to investigate to find out what is causing this.  Also your potassium levels were low today and you were given a handout with a bunch of foods that you can eat to improve your potassium but it will need to be rechecked to make sure it is getting better.

## 2022-07-30 NOTE — ED Provider Notes (Signed)
[place] EMERGENCY DEPARTMENT AT [facility] Provider Note   CSN: [id number] Arrival date & time: 07/30/22  1554     History  Chief Complaint  Patient presents with   Sore    Right ribs    [name] is a 82 y.o. female.  Patient is an 82 year old female with a history of aortic stenosis status post valve replacement, hypertension, CAD, pulmonary hypertension who is presenting today with worsening right rib and back pain.  Patient reports the pain is really been present since June of last year but seems to be getting worse.  In the fall they found that she had a mass near her lung which was removed but was found to be noncancerous she then had imaging that showed a T11 compression fracture with a mass as well which is also not cancerous and she got kyphoplasty.  She reports since that time she has had ongoing pain.  She has tried Tylenol and ibuprofen but after taking it for a few days she starts getting abdominal pain and will have occasional vomiting.  She reports the pain is worse with coughing, taking a deep breath and certain movements.  She denies fever or cough but does report she has lost weight because she just does not feel like eating.  She did report that her doctor told her this month that she needed to start taking iron but did not tell her what her blood counts were but she has been taking 900 mg daily.  She reports that they do have imaging scheduled for Friday but she just did not know what else to do because she was so uncomfortable.  Laying down and not moving seems to be the most comfortable position.  The history is provided by the patient and medical records.       Home Medications Prior to Admission medications   Medication Sig Start Date End Date Taking? Authorizing Provider  HYDROcodone-acetaminophen (NORCO/VICODIN) 5-325 MG tablet Take 1 tablet by mouth every 6 (six) hours as needed for severe pain. 07/30/22  Yes Gwyneth Sprout, MD   acetaminophen (TYLENOL) 325 MG tablet Take 2 tablets (650 mg total) by mouth every 6 (six) hours as needed. Patient not taking: Reported on 03/21/2022 03/15/22   Sloan Leiter, DO  carboxymethylcellulose (REFRESH PLUS) 0.5 % SOLN Place 1 drop into both eyes daily as needed (dry/irritated eyes).    [provider]  hydrochlorothiazide (MICROZIDE) 12.5 MG capsule Take 1 capsule (12.5 mg total) by mouth daily. 11/19/21   Quintella Reichert, MD  lisinopril (ZESTRIL) 10 MG tablet Take 1 tablet by mouth once daily 11/13/21   Quintella Reichert, MD      Allergies    Crestor [rosuvastatin calcium], Lipitor [atorvastatin], Pravastatin, Simvastatin, Tramadol, Zetia [ezetimibe], Codeine, Penicillins, Sulfa antibiotics, and Vancomycin    Review of Systems   Review of Systems  Physical Exam Updated Vital Signs BP (!) 160/54 (BP Location: Right Arm)   Pulse 69   Temp 99 F (37.2 C)   Resp 19   SpO2 95%  Physical Exam Vitals and nursing note reviewed.  Constitutional:      General: She is not in acute distress.    Appearance: She is well-developed.  HENT:     Head: Normocephalic and atraumatic.  Eyes:     Conjunctiva/sclera: Conjunctivae normal.     Pupils: Pupils are equal, round, and reactive to light.  Cardiovascular:     Rate and Rhythm: Normal rate and regular  rhythm.     Heart sounds: No murmur heard. Pulmonary:     Effort: Pulmonary effort is normal. No respiratory distress.     Breath sounds: Normal breath sounds. No wheezing or rales.    Chest:     Chest wall: Tenderness present.       Comments: Also some right CVA and right upper abdominal tenderness Abdominal:     General: There is no distension.     Palpations: Abdomen is soft.     Tenderness: There is abdominal tenderness in the right upper quadrant. There is right CVA tenderness. There is no guarding or rebound.  Musculoskeletal:        General: No tenderness. Normal range of motion.     Cervical back: Normal range  of motion and neck supple.     Right lower leg: No edema.     Left lower leg: No edema.  Skin:    General: Skin is warm and dry.     Findings: No erythema or rash.  Neurological:     Mental Status: She is alert and oriented to person, place, and time. Mental status is at baseline.  Psychiatric:        Behavior: Behavior normal.     ED Results / Procedures / Treatments   Labs (all labs ordered are listed, but only abnormal results are displayed) Labs Reviewed  CBC WITH DIFFERENTIAL/PLATELET - Abnormal; Notable for the following components:      Result Value   WBC 12.5 (*)    RBC 2.55 (*)    Hemoglobin 8.0 (*)    HCT 25.1 (*)    Monocytes Absolute 1.2 (*)    Abs Immature Granulocytes 0.13 (*)    All other components within normal limits  BRAIN NATRIURETIC PEPTIDE - Abnormal; Notable for the following components:   B Natriuretic Peptide 210.4 (*)    All other components within normal limits  URINALYSIS, ROUTINE W REFLEX MICROSCOPIC - Abnormal; Notable for the following components:   Hgb urine dipstick SMALL (*)    Bacteria, UA RARE (*)    All other components within normal limits  COMPREHENSIVE METABOLIC PANEL - Abnormal; Notable for the following components:   Sodium 132 (*)    Potassium 2.9 (*)    Glucose, Bld 109 (*)    BUN 24 (*)    Creatinine, Ser 1.01 (*)    Calcium 8.8 (*)    Total Protein 10.1 (*)    Albumin 2.1 (*)    GFR, Estimated 55 (*)    Anion gap 3 (*)    All other components within normal limits  LIPASE, BLOOD  POC OCCULT BLOOD, ED    EKG None  Radiology CT Angio Chest PE W and/or Wo Contrast  Result Date: 07/30/2022 CLINICAL DATA:  Right-sided abdominal pain, right-sided chest wall pain EXAM: CT ANGIOGRAPHY CHEST CT ABDOMEN AND PELVIS WITH CONTRAST TECHNIQUE: Multidetector CT imaging of the chest was performed using the standard protocol during bolus administration of intravenous contrast. Multiplanar CT image reconstructions and MIPs were obtained  to evaluate the vascular anatomy. Multidetector CT imaging of the abdomen and pelvis was performed using the standard protocol during bolus administration of intravenous contrast. RADIATION DOSE REDUCTION: This exam was performed according to the departmental dose-optimization program which includes automated exposure control, adjustment of the mA and/or kV according to patient size and/or use of iterative reconstruction technique. CONTRAST:  80mL OMNIPAQUE IOHEXOL 350 MG/ML SOLN COMPARISON:  07/26/2022, 11/28/2021, 11/16/2021 FINDINGS: CTA CHEST FINDINGS Cardiovascular:  This is a technically adequate evaluation of the pulmonary vasculature. No filling defects or pulmonary emboli. Prominent left atrial dilatation. No pericardial effusion. Aortic valve prosthesis. Atherosclerosis of the coronary vasculature most pronounced in the LAD distribution. Ectasia of the ascending thoracic aorta measuring up to 3.9 cm in diameter. No evidence of dissection. Atherosclerosis most pronounced in the aortic arch and descending thoracic aorta. Mediastinum/Nodes: No enlarged mediastinal, hilar, or axillary lymph nodes. Thyroid gland, trachea, and esophagus demonstrate no significant findings. The soft tissue mediastinal mass seen adjacent to the pulmonary artery on prior exam has resolved or been resected in the interim. Lungs/Pleura: Mild bibasilar atelectasis, left greater than right. Mosaic areas of geographic ground-glass attenuation within the lungs consistent with small airway disease. No dense consolidation, effusion, or pneumothorax. The central airways are patent. Musculoskeletal: Mottled lucencies are seen throughout the thoracic cage, most pronounced within the left eighth and ninth ribs posteriorly, new since prior study. Chronic appearing left fourth and right eighth rib fractures, also new since the previous exam is. There are no acute displaced fractures. Chronic T11 compression deformity with vertebral augmentation.  Reconstructed images demonstrate no additional findings. Review of the MIP images confirms the above findings. CT ABDOMEN and PELVIS FINDINGS Hepatobiliary: No focal liver abnormality is seen. Status post cholecystectomy. No biliary dilatation. Pancreas: Unremarkable. No pancreatic ductal dilatation or surrounding inflammatory changes. Spleen: Normal in size without focal abnormality. Adrenals/Urinary Tract: There are no acute renal findings. The adrenals are stable. Bladder is unremarkable. Stomach/Bowel: No bowel obstruction or ileus. Normal appendix right lower quadrant. No bowel wall thickening or inflammatory change. Vascular/Lymphatic: Atherosclerosis of the aorta and its branches. No pathologic adenopathy within the abdomen or pelvis. Reproductive: Stable simple appearing 3.3 x 4.8 cm left adnexal cyst. No right adnexal mass. The uterus is atrophic. Other: No free fluid or free intraperitoneal gas. No abdominal wall hernia. Musculoskeletal: There are no acute displaced fractures. 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 Reconstructed images demonstrate no additional findings. Review of the MIP images confirms the above findings. IMPRESSION: 1. Mottled punctate lucencies throughout the ribcage, new since prior study. Differential diagnosis would include metastatic disease versus multiple myeloma. Please correlate with laboratory analysis. 2. There are no acute displaced fractures. Chronic appearing healed bilateral rib fractures have developed in the interim since prior exam. 3. No evidence of pulmonary embolus. 4. Mosaic ground-glass attenuation within the lungs, most consistent with small airway disease. No dense airspace disease to suggest pneumonia. 5. Stable 4.8 cm simple appearing left adnexal cyst. A follow-up pelvic ultrasound was previously recommended based on PET scan findings. 6.  Aortic Atherosclerosis (ICD10-I70.0). Electronically Signed   By: Sharlet Salina M.D.   On:  07/30/2022 21:45   CT ABDOMEN PELVIS W CONTRAST  Result Date: 07/30/2022 CLINICAL DATA:  Right-sided abdominal pain, right-sided chest wall pain EXAM: CT ANGIOGRAPHY CHEST CT ABDOMEN AND PELVIS WITH CONTRAST TECHNIQUE: Multidetector CT imaging of the chest was performed using the standard protocol during bolus administration of intravenous contrast. Multiplanar CT image reconstructions and MIPs were obtained to evaluate the vascular anatomy. Multidetector CT imaging of the abdomen and pelvis was performed using the standard protocol during bolus administration of intravenous contrast. RADIATION DOSE REDUCTION: This exam was performed according to the departmental dose-optimization program which includes automated exposure control, adjustment of the mA and/or kV according to patient size and/or use of iterative reconstruction technique. CONTRAST:  80mL OMNIPAQUE IOHEXOL 350 MG/ML SOLN COMPARISON:  07/26/2022, 11/28/2021, 11/16/2021 FINDINGS: CTA  CHEST FINDINGS Cardiovascular: This is a technically adequate evaluation of the pulmonary vasculature. No filling defects or pulmonary emboli. Prominent left atrial dilatation. No pericardial effusion. Aortic valve prosthesis. Atherosclerosis of the coronary vasculature most pronounced in the LAD distribution. Ectasia of the ascending thoracic aorta measuring up to 3.9 cm in diameter. No evidence of dissection. Atherosclerosis most pronounced in the aortic arch and descending thoracic aorta. Mediastinum/Nodes: No enlarged mediastinal, hilar, or axillary lymph nodes. Thyroid gland, trachea, and esophagus demonstrate no significant findings. The soft tissue mediastinal mass seen adjacent to the pulmonary artery on prior exam has resolved or been resected in the interim. Lungs/Pleura: Mild bibasilar atelectasis, left greater than right. Mosaic areas of geographic ground-glass attenuation within the lungs consistent with small airway disease. No dense consolidation, effusion,  or pneumothorax. The central airways are patent. Musculoskeletal: Mottled lucencies are seen throughout the thoracic cage, most pronounced within the left eighth and ninth ribs posteriorly, new since prior study. Chronic appearing left fourth and right eighth rib fractures, also new since the previous exam is. There are no acute displaced fractures. Chronic T11 compression deformity with vertebral augmentation. Reconstructed images demonstrate no additional findings. Review of the MIP images confirms the above findings. CT ABDOMEN and PELVIS FINDINGS Hepatobiliary: No focal liver abnormality is seen. Status post cholecystectomy. No biliary dilatation. Pancreas: Unremarkable. No pancreatic ductal dilatation or surrounding inflammatory changes. Spleen: Normal in size without focal abnormality. Adrenals/Urinary Tract: There are no acute renal findings. The adrenals are stable. Bladder is unremarkable. Stomach/Bowel: No bowel obstruction or ileus. Normal appendix right lower quadrant. No bowel wall thickening or inflammatory change. Vascular/Lymphatic: Atherosclerosis of the aorta and its branches. No pathologic adenopathy within the abdomen or pelvis. Reproductive: Stable simple appearing 3.3 x 4.8 cm left adnexal cyst. No right adnexal mass. The uterus is atrophic. Other: No free fluid or free intraperitoneal gas. No abdominal wall hernia. Musculoskeletal: There are no acute displaced fractures. 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 Reconstructed images demonstrate no additional findings. Review of the MIP images confirms the above findings. IMPRESSION: 1. Mottled punctate lucencies throughout the ribcage, new since prior study. Differential diagnosis would include metastatic disease versus multiple myeloma. Please correlate with laboratory analysis. 2. There are no acute displaced fractures. Chronic appearing healed bilateral rib fractures have developed in the interim since prior exam.  3. No evidence of pulmonary embolus. 4. Mosaic ground-glass attenuation within the lungs, most consistent with small airway disease. No dense airspace disease to suggest pneumonia. 5. Stable 4.8 cm simple appearing left adnexal cyst. A follow-up pelvic ultrasound was previously recommended based on PET scan findings. 6.  Aortic Atherosclerosis (ICD10-I70.0). Electronically Signed   By: Sharlet Salina M.D.   On: 07/30/2022 21:45   DG Chest 2 View  Result Date: 07/30/2022 CLINICAL DATA:  Right-sided chest wall pain EXAM: CHEST - 2 VIEW COMPARISON:  06/18/2022 FINDINGS: Frontal and lateral views of the chest demonstrate stable postsurgical changes from median sternotomy and aortic valve replacement. The cardiac silhouette is stable. No acute airspace disease, effusion, or pneumothorax. Chronic wedge compression deformity and vertebral augmentation at the thoracolumbar junction. No acute bony abnormality. IMPRESSION: 1. No acute intrathoracic process. Electronically Signed   By: Sharlet Salina M.D.   On: 07/30/2022 17:20    Procedures Procedures    Medications Ordered in ED Medications  lidocaine (LIDODERM) 5 % 1 patch (1 patch Transdermal Patch Applied 07/30/22 2002)  iohexol (OMNIPAQUE) 350 MG/ML injection 80 mL (80 mLs Intravenous  Contrast Given 07/30/22 2105)  HYDROcodone-acetaminophen (NORCO/VICODIN) 5-325 MG per tablet 1 tablet (1 tablet Oral Given 07/30/22 2233)    ED Course/ Medical Decision Making/ A&P                             Medical Decision Making Amount and/or Complexity of Data Reviewed External Data Reviewed: notes. Labs: ordered. Decision-making details documented in ED Course. Radiology: ordered and independent interpretation performed. Decision-making details documented in ED Course.  Risk Prescription drug management.   Pt with multiple medical problems and comorbidities and presenting today with a complaint that caries a high risk for morbidity and mortality.  Here  today for persistent pain in her right back and side that is worsening.  Patient has had biopsy of 2 masses since the beginning of the pain both which were noncancerous.  She has had some mild weight loss which she attributes to not being hungry but also patient has been taking Tylenol and ibuprofen intermittently with gastritis type symptoms when she takes the medication for more than 3 days.  She is hemodynamically stable on exam the pain is reproducible.  Could be musculoskeletal in nature, no findings to suggest zoster, could be PE, new tumor or abdominal pathology with referred pain.  Patient had an MRI of her thoracic spine and she did have some disc bulge at T5-6 and T6-7.  She did have a mass in T11 but that was removed and she reports it was noncancerous.  Patient given a lidocaine patch. I independently interpreted patient's labs today and CBC shows a hemoglobin of 8.0 from 11.6 in December.  CMP with hypokalemia today of 2.9, creatinine of 1.0 from 0.8 in the past, calcium of 8.8, lipase within normal limits, UA without acute findings.  Unclear what her hemoglobin was when she saw her doctor in March.  Concern for GI bleeding knowing that she has had ibuprofen recently versus anemia from possible cancer.  Hemoccult today was negative. Will do scans of the chest and abdomen to look for other causes of her pain.  11:21 PM I have independently visualized and interpreted pt's images today.  CT today without evidence of PE or lung mass but radiology notes mottled punctate lucencies throughout the rib cage new since prior studies concern for metastatic disease versus multiple myeloma.  Also patient showing chronic appearing healed bilateral rib fractures also new since prior studies but may be the explanation why she continues to have pain.  Findings discussed with the patient.  She has seen Dr. Arbutus Ped in the past and a ambulatory referral placed for oncology/hematology.  Patient will continue her iron at  this time.  Also her hypokalemia is most likely related to the fact that she is on hydrochlorothiazide.  She wants to try improving her potassium by food as she is not interested in taking the potassium pills.  At this time patient given pain control to go home with.  And close outpatient follow-up.  They are comfortable with this plan.         Final Clinical Impression(s) / ED Diagnoses Final diagnoses:  Lytic bone lesions on xray  Hypokalemia  Rib pain  Anemia, unspecified type    Rx / DC Orders ED Discharge Orders          Ordered    Ambulatory referral to Hematology / Oncology       Comments: New bony lesions, rib fractures concern for metastatic disease or  multiple myeloma also new anemia.  Seen Mohammad before   07/30/22 2317    HYDROcodone-acetaminophen (NORCO/VICODIN) 5-325 MG tablet  Every 6 hours PRN        07/30/22 2318              Gwyneth Sprout, MD 07/30/22 2324

## 2022-07-30 NOTE — ED Triage Notes (Signed)
Pt is here for pain in right ribs, under right breast x June or July 2023.  No sob

## 2022-07-30 NOTE — ED Notes (Signed)
Discharge summary/instructions reviewed with pt. Provided pt with opportunity to ask any questions, pt had no further questions or concerns. After-Visit Summary provided to pt. 

## 2022-07-30 NOTE — ED Provider Triage Note (Signed)
Emergency Medicine Provider Triage Evaluation Note  DELYLA SANDEEN , a 82 y.o. female  was evaluated in triage.  Pt complains of bilateral lower back pain, as well as right sided chest wall discomfort. Symptoms have been present since a laparoscopic procedure in June of last year. Patient has been seen multiple times for same complaint. Chest discomfort has increased over the last several weeks. Patient endorses increasing shortness of breath with activity over the last 2 weeks.  Review of Systems  Positive: Shortness of breath, chest wall pain, back pain Negative: Abdominal pain, dysuria  Physical Exam  BP (!) 178/54 (BP Location: Right Arm)   Pulse 73   Temp 99.5 F (37.5 C) (Oral)   Resp 19   SpO2 99%  Gen:   Awake, no distress   Resp:  Normal effort  MSK:   Moves extremities without difficulty  Other:  TTP along anterior aspect of right chest wall under breast. No rash.  Medical Decision Making  Medically screening exam initiated at 4:44 PM.  Appropriate orders placed.  Shaaron Adler was informed that the remainder of the evaluation will be completed by another provider, this initial triage assessment does not replace that evaluation, and the importance of remaining in the ED until their evaluation is complete.     Felicie Morn, NP 07/30/22 [phone]

## 2022-08-01 ENCOUNTER — Telehealth: Payer: Self-pay | Admitting: Medical Oncology

## 2022-08-01 ENCOUNTER — Telehealth: Payer: Self-pay | Admitting: Internal Medicine

## 2022-08-01 NOTE — Telephone Encounter (Signed)
Scheduled per 04/25 inbasket message, patient has been called and notified.

## 2022-08-01 NOTE — Telephone Encounter (Signed)
She left VM that "Dr Anitra Lauth said she needs to see Dr. Arbutus Ped" Message sent to [facility]. Marland Kitchen

## 2022-08-05 ENCOUNTER — Other Ambulatory Visit: Payer: Self-pay | Admitting: Medical Oncology

## 2022-08-05 ENCOUNTER — Telehealth: Payer: Self-pay | Admitting: Internal Medicine

## 2022-08-05 DIAGNOSIS — D4989 Neoplasm of unspecified behavior of other specified sites: Secondary | ICD-10-CM

## 2022-08-06 ENCOUNTER — Inpatient Hospital Stay: Payer: Medicare PPO

## 2022-08-06 ENCOUNTER — Inpatient Hospital Stay: Payer: Medicare PPO | Admitting: Internal Medicine

## 2022-08-06 ENCOUNTER — Other Ambulatory Visit: Payer: Self-pay | Admitting: Medical Oncology

## 2022-08-06 ENCOUNTER — Inpatient Hospital Stay: Payer: Medicare PPO | Attending: Internal Medicine

## 2022-08-06 ENCOUNTER — Other Ambulatory Visit: Payer: Self-pay

## 2022-08-06 VITALS — BP 179/53 | HR 79 | Temp 97.2°F | Resp 15 | Wt 154.5 lb

## 2022-08-06 DIAGNOSIS — Z79899 Other long term (current) drug therapy: Secondary | ICD-10-CM | POA: Insufficient documentation

## 2022-08-06 DIAGNOSIS — D649 Anemia, unspecified: Secondary | ICD-10-CM

## 2022-08-06 DIAGNOSIS — I10 Essential (primary) hypertension: Secondary | ICD-10-CM | POA: Insufficient documentation

## 2022-08-06 DIAGNOSIS — R779 Abnormality of plasma protein, unspecified: Secondary | ICD-10-CM | POA: Insufficient documentation

## 2022-08-06 DIAGNOSIS — D472 Monoclonal gammopathy: Secondary | ICD-10-CM

## 2022-08-06 DIAGNOSIS — D4989 Neoplasm of unspecified behavior of other specified sites: Secondary | ICD-10-CM

## 2022-08-06 DIAGNOSIS — M899 Disorder of bone, unspecified: Secondary | ICD-10-CM | POA: Insufficient documentation

## 2022-08-06 LAB — CBC WITH DIFFERENTIAL (CANCER CENTER ONLY)
Abs Immature Granulocytes: 0.25 10*3/uL — ABNORMAL HIGH (ref 0.00–0.07)
Basophils Absolute: 0 10*3/uL (ref 0.0–0.1)
Basophils Relative: 0 %
Eosinophils Absolute: 0 10*3/uL (ref 0.0–0.5)
Eosinophils Relative: 0 %
HCT: 23 % — ABNORMAL LOW (ref 36.0–46.0)
Hemoglobin: 7.6 g/dL — ABNORMAL LOW (ref 12.0–15.0)
Immature Granulocytes: 2 %
Lymphocytes Relative: 22 %
Lymphs Abs: 3.2 10*3/uL (ref 0.7–4.0)
MCH: 31.7 pg (ref 26.0–34.0)
MCHC: 33 g/dL (ref 30.0–36.0)
MCV: 95.8 fL (ref 80.0–100.0)
Monocytes Absolute: 1.2 10*3/uL — ABNORMAL HIGH (ref 0.1–1.0)
Monocytes Relative: 9 %
Neutro Abs: 9.7 10*3/uL — ABNORMAL HIGH (ref 1.7–7.7)
Neutrophils Relative %: 67 %
Platelet Count: 326 10*3/uL (ref 150–400)
RBC: 2.4 MIL/uL — ABNORMAL LOW (ref 3.87–5.11)
RDW: 14.3 % (ref 11.5–15.5)
WBC Count: 14.5 10*3/uL — ABNORMAL HIGH (ref 4.0–10.5)
nRBC: 0 % (ref 0.0–0.2)

## 2022-08-06 LAB — SAMPLE TO BLOOD BANK

## 2022-08-06 LAB — TYPE AND SCREEN: ABO/RH(D): O POS

## 2022-08-06 LAB — BPAM RBC: Blood Product Expiration Date: 202406032359

## 2022-08-06 LAB — CMP (CANCER CENTER ONLY)
ALT: 20 U/L (ref 0–44)
AST: 24 U/L (ref 15–41)
Albumin: 2.5 g/dL — ABNORMAL LOW (ref 3.5–5.0)
Alkaline Phosphatase: 100 U/L (ref 38–126)
Anion gap: 1 — ABNORMAL LOW (ref 5–15)
BUN: 29 mg/dL — ABNORMAL HIGH (ref 8–23)
CO2: 31 mmol/L (ref 22–32)
Calcium: 10.1 mg/dL (ref 8.9–10.3)
Chloride: 99 mmol/L (ref 98–111)
Creatinine: 1.18 mg/dL — ABNORMAL HIGH (ref 0.44–1.00)
GFR, Estimated: 46 mL/min — ABNORMAL LOW (ref >60)
Glucose, Bld: 119 mg/dL — ABNORMAL HIGH (ref 70–99)
Potassium: 3.2 mmol/L — ABNORMAL LOW (ref 3.5–5.1)
Sodium: 131 mmol/L — ABNORMAL LOW (ref 135–145)
Total Bilirubin: 0.3 mg/dL (ref 0.3–1.2)
Total Protein: 10.2 g/dL — ABNORMAL HIGH (ref 6.5–8.1)

## 2022-08-06 LAB — LACTATE DEHYDROGENASE: LDH: 116 U/L (ref 98–192)

## 2022-08-06 NOTE — Progress Notes (Signed)
Blood transfusion ordered.

## 2022-08-06 NOTE — Progress Notes (Signed)
[facility] [phone]   [phone]  OFFICE PROGRESS NOTE  [name], MD [address] [place] [address] [place] [address]  DIAGNOSIS:  1) highly suspicious multiple myeloma. 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 Dorris Fetch on January 28, 2022.  CURRENT THERAPY: Observation.  INTERVAL HISTORY: [name] 82 y.o. female returns to the clinic today for evaluation accompanied by her daughter-in-law Bjorn Loser.  The patient has been complaining of increasing fatigue and weakness recently.  She was evaluated at the emergency department on July 30, 2022 after complaining of pain in the right ribs area.  She had imaging studies during that time including CT angiogram of the chest as well as CT scan of the abdomen and pelvis.  Her scan showed multiple punctate lucencies throughout the rib cage new since the prior study suspicious for metastatic disease versus multiple myeloma.  The patient also had chronic bilateral rib fractures that have developed since the prior imaging studies.  She was referred to me today for evaluation and recommendation regarding her condition.  She continues to have fatigue and weakness.  She has no nausea, vomiting, diarrhea or constipation.  She has no headache or visual changes.  She had 1 time of hemorrhoidal bleed recently.  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 by echo 10/2021   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/2015   Carotid artery stenosis    < 50% stenosis bilaterally by doppler 07/2016   Coronary  artery disease 2008   Coronary Ca score of 331 with minimal multivessel plaque < 25% stenosis by coronary CTA 8/23   Heart murmur    per pt   Hypercholesteremia    Hypertension    Obesity    Osteopenia    declines treatment   Pneumonia 1995   Pulmonary HTN (HCC)    mild to moderate by echo 7/23 with PASP   Shoulder pain    Due to arthritis   Vitamin D insufficiency     ALLERGIES:  is allergic to crestor [rosuvastatin calcium], lipitor [atorvastatin], pravastatin, simvastatin, tramadol, zetia [ezetimibe], codeine, penicillins, sulfa antibiotics, and vancomycin.  MEDICATIONS:  Current Outpatient Medications  Medication Sig Dispense Refill   acetaminophen (TYLENOL) 325 MG tablet Take 2 tablets (650 mg total) by mouth every 6 (six) hours as needed. (Patient not taking: Reported on 03/21/2022) 36 tablet 0   carboxymethylcellulose (REFRESH PLUS) 0.5 % SOLN Place 1 drop into both eyes daily as needed (dry/irritated eyes).     hydrochlorothiazide (MICROZIDE) 12.5 MG capsule Take 1 capsule (12.5 mg total) by mouth daily. 90 capsule 3   HYDROcodone-acetaminophen (NORCO/VICODIN) 5-325 MG tablet Take 1 tablet by mouth every 6 (six) hours as needed for severe pain. 15 tablet 0   lisinopril (ZESTRIL) 10 MG tablet Take 1 tablet by mouth once daily 30 tablet 11   No current facility-administered medications for this visit.    SURGICAL HISTORY:  Past Surgical History:  Procedure Laterality Date   AORTIC VALVE REPLACEMENT N/A  10/14/2013   Procedure: AORTIC VALVE REPLACEMENT (AVR);  Surgeon: Alleen Borne, MD;  Location: [facility] OR;  Service: Open Heart Surgery;  Laterality: N/A;   CARDIAC CATHETERIZATION     CATARACT EXTRACTION, BILATERAL     CHOLECYSTECTOMY  04/08/1988   INTRAOPERATIVE TRANSESOPHAGEAL ECHOCARDIOGRAM N/A 10/14/2013   Procedure: INTRAOPERATIVE TRANSESOPHAGEAL ECHOCARDIOGRAM;  Surgeon: Alleen Borne, MD;  Location: [facility] OR;  Service: Open Heart Surgery;  Laterality: N/A;    KYPHOPLASTY Bilateral 03/27/2022   Procedure: KYPHOPLASTY AND BIOPSY THORACIC ELEVEN;  Surgeon: Lisbeth Renshaw, MD;  Location: [facility] OR;  Service: Neurosurgery;  Laterality: Bilateral;   LEFT AND RIGHT HEART CATHETERIZATION WITH CORONARY ANGIOGRAM N/A 09/16/2013   Procedure: LEFT AND RIGHT HEART CATHETERIZATION WITH CORONARY ANGIOGRAM;  Surgeon: Quintella Reichert, MD;  Location: [facility] CATH LAB;  Service: Cardiovascular;  Laterality: N/A;   TONSILLECTOMY      REVIEW OF SYSTEMS:  Constitutional: positive for fatigue Eyes: negative Ears, nose, mouth, throat, and face: negative Respiratory: negative Cardiovascular: negative Gastrointestinal: negative Genitourinary:negative Integument/breast: negative Hematologic/lymphatic: negative Musculoskeletal:positive for bone pain Neurological: negative Behavioral/Psych: negative Endocrine: negative Allergic/Immunologic: negative   PHYSICAL EXAMINATION: General appearance: alert, cooperative, fatigued, and no distress Head: Normocephalic, without obvious abnormality, atraumatic Neck: no adenopathy, no JVD, supple, symmetrical, trachea midline, and thyroid not enlarged, symmetric, no tenderness/mass/nodules Lymph nodes: Cervical, supraclavicular, and axillary nodes normal. Resp: clear to auscultation bilaterally Back: symmetric, no curvature. ROM normal. No CVA tenderness. Cardio: regular rate and rhythm, S1, S2 normal, no murmur, click, rub or gallop GI: soft, non-tender; bowel sounds normal; no masses,  no organomegaly Extremities: extremities normal, atraumatic, no cyanosis or edema Neurologic: Alert and oriented X 3, normal strength and tone. Normal symmetric reflexes. Normal coordination and gait  ECOG PERFORMANCE STATUS: 2 - Symptomatic, <50% confined to bed  Blood pressure (!) 179/53, pulse 79, temperature (!) 97.2 F (36.2 C), temperature source Temporal, resp. rate 15, weight 154 lb 8 oz (70.1 kg), SpO2 99 %.  LABORATORY DATA: Lab Results   Component Value Date   WBC 14.5 (H) 08/06/2022   HGB 7.6 (L) 08/06/2022   HCT 23.0 (L) 08/06/2022   MCV 95.8 08/06/2022   PLT 326 08/06/2022      Chemistry      Component Value Date/Time   NA 132 (L) 07/30/2022 2000   NA 141 10/06/2020 0939   K 2.9 (L) 07/30/2022 2000   CL 101 07/30/2022 2000   CO2 28 07/30/2022 2000   BUN 24 (H) 07/30/2022 2000   BUN 17 10/06/2020 0939   CREATININE 1.01 (H) 07/30/2022 2000   CREATININE 0.93 02/25/2022 0855   CREATININE 0.85 01/29/2016 0910      Component Value Date/Time   CALCIUM 8.8 (L) 07/30/2022 2000   ALKPHOS 94 07/30/2022 2000   AST 22 07/30/2022 2000   AST 15 02/25/2022 0855   ALT 18 07/30/2022 2000   ALT 14 02/25/2022 0855   BILITOT 0.5 07/30/2022 2000   BILITOT 0.6 02/25/2022 0855       RADIOGRAPHIC STUDIES: CT Angio Chest PE W and/or Wo Contrast  Result Date: 07/30/2022 CLINICAL DATA:  Right-sided abdominal pain, right-sided chest wall pain EXAM: CT ANGIOGRAPHY CHEST CT ABDOMEN AND PELVIS WITH CONTRAST TECHNIQUE: Multidetector CT imaging of the chest was performed using the standard protocol during bolus administration of intravenous contrast. Multiplanar CT image reconstructions and MIPs were obtained to evaluate the vascular anatomy. Multidetector CT imaging of the abdomen and pelvis was performed using the standard protocol during bolus administration  of intravenous contrast. RADIATION DOSE REDUCTION: This exam was performed according to the departmental dose-optimization program which includes automated exposure control, adjustment of the mA and/or kV according to patient size and/or use of iterative reconstruction technique. CONTRAST:  80mL OMNIPAQUE IOHEXOL 350 MG/ML SOLN COMPARISON:  07/26/2022, 11/28/2021, 11/16/2021 FINDINGS: CTA CHEST FINDINGS Cardiovascular: This is a technically adequate evaluation of the pulmonary vasculature. No filling defects or pulmonary emboli. Prominent left atrial dilatation. No pericardial  effusion. Aortic valve prosthesis. Atherosclerosis of the coronary vasculature most pronounced in the LAD distribution. Ectasia of the ascending thoracic aorta measuring up to 3.9 cm in diameter. No evidence of dissection. Atherosclerosis most pronounced in the aortic arch and descending thoracic aorta. Mediastinum/Nodes: No enlarged mediastinal, hilar, or axillary lymph nodes. Thyroid gland, trachea, and esophagus demonstrate no significant findings. The soft tissue mediastinal mass seen adjacent to the pulmonary artery on prior exam has resolved or been resected in the interim. Lungs/Pleura: Mild bibasilar atelectasis, left greater than right. Mosaic areas of geographic ground-glass attenuation within the lungs consistent with small airway disease. No dense consolidation, effusion, or pneumothorax. The central airways are patent. Musculoskeletal: Mottled lucencies are seen throughout the thoracic cage, most pronounced within the left eighth and ninth ribs posteriorly, new since prior study. Chronic appearing left fourth and right eighth rib fractures, also new since the previous exam is. There are no acute displaced fractures. Chronic T11 compression deformity with vertebral augmentation. Reconstructed images demonstrate no additional findings. Review of the MIP images confirms the above findings. CT ABDOMEN and PELVIS FINDINGS Hepatobiliary: No focal liver abnormality is seen. Status post cholecystectomy. No biliary dilatation. Pancreas: Unremarkable. No pancreatic ductal dilatation or surrounding inflammatory changes. Spleen: Normal in size without focal abnormality. Adrenals/Urinary Tract: There are no acute renal findings. The adrenals are stable. Bladder is unremarkable. Stomach/Bowel: No bowel obstruction or ileus. Normal appendix right lower quadrant. No bowel wall thickening or inflammatory change. Vascular/Lymphatic: Atherosclerosis of the aorta and its branches. No pathologic adenopathy within the  abdomen or pelvis. Reproductive: Stable simple appearing 3.3 x 4.8 cm left adnexal cyst. No right adnexal mass. The uterus is atrophic. Other: No free fluid or free intraperitoneal gas. No abdominal wall hernia. Musculoskeletal: There are no acute displaced fractures. 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 Reconstructed images demonstrate no additional findings. Review of the MIP images confirms the above findings. IMPRESSION: 1. Mottled punctate lucencies throughout the ribcage, new since prior study. Differential diagnosis would include metastatic disease versus multiple myeloma. Please correlate with laboratory analysis. 2. There are no acute displaced fractures. Chronic appearing healed bilateral rib fractures have developed in the interim since prior exam. 3. No evidence of pulmonary embolus. 4. Mosaic ground-glass attenuation within the lungs, most consistent with small airway disease. No dense airspace disease to suggest pneumonia. 5. Stable 4.8 cm simple appearing left adnexal cyst. A follow-up pelvic ultrasound was previously recommended based on PET scan findings. 6.  Aortic Atherosclerosis (ICD10-I70.0). Electronically Signed   By: Sharlet Salina M.D.   On: 07/30/2022 21:45   CT ABDOMEN PELVIS W CONTRAST  Result Date: 07/30/2022 CLINICAL DATA:  Right-sided abdominal pain, right-sided chest wall pain EXAM: CT ANGIOGRAPHY CHEST CT ABDOMEN AND PELVIS WITH CONTRAST TECHNIQUE: Multidetector CT imaging of the chest was performed using the standard protocol during bolus administration of intravenous contrast. Multiplanar CT image reconstructions and MIPs were obtained to evaluate the vascular anatomy. Multidetector CT imaging of the abdomen and pelvis was performed using the standard protocol  during bolus administration of intravenous contrast. RADIATION DOSE REDUCTION: This exam was performed according to the departmental dose-optimization program which includes automated  exposure control, adjustment of the mA and/or kV according to patient size and/or use of iterative reconstruction technique. CONTRAST:  80mL OMNIPAQUE IOHEXOL 350 MG/ML SOLN COMPARISON:  07/26/2022, 11/28/2021, 11/16/2021 FINDINGS: CTA CHEST FINDINGS Cardiovascular: This is a technically adequate evaluation of the pulmonary vasculature. No filling defects or pulmonary emboli. Prominent left atrial dilatation. No pericardial effusion. Aortic valve prosthesis. Atherosclerosis of the coronary vasculature most pronounced in the LAD distribution. Ectasia of the ascending thoracic aorta measuring up to 3.9 cm in diameter. No evidence of dissection. Atherosclerosis most pronounced in the aortic arch and descending thoracic aorta. Mediastinum/Nodes: No enlarged mediastinal, hilar, or axillary lymph nodes. Thyroid gland, trachea, and esophagus demonstrate no significant findings. The soft tissue mediastinal mass seen adjacent to the pulmonary artery on prior exam has resolved or been resected in the interim. Lungs/Pleura: Mild bibasilar atelectasis, left greater than right. Mosaic areas of geographic ground-glass attenuation within the lungs consistent with small airway disease. No dense consolidation, effusion, or pneumothorax. The central airways are patent. Musculoskeletal: Mottled lucencies are seen throughout the thoracic cage, most pronounced within the left eighth and ninth ribs posteriorly, new since prior study. Chronic appearing left fourth and right eighth rib fractures, also new since the previous exam is. There are no acute displaced fractures. Chronic T11 compression deformity with vertebral augmentation. Reconstructed images demonstrate no additional findings. Review of the MIP images confirms the above findings. CT ABDOMEN and PELVIS FINDINGS Hepatobiliary: No focal liver abnormality is seen. Status post cholecystectomy. No biliary dilatation. Pancreas: Unremarkable. No pancreatic ductal dilatation or  surrounding inflammatory changes. Spleen: Normal in size without focal abnormality. Adrenals/Urinary Tract: There are no acute renal findings. The adrenals are stable. Bladder is unremarkable. Stomach/Bowel: No bowel obstruction or ileus. Normal appendix right lower quadrant. No bowel wall thickening or inflammatory change. Vascular/Lymphatic: Atherosclerosis of the aorta and its branches. No pathologic adenopathy within the abdomen or pelvis. Reproductive: Stable simple appearing 3.3 x 4.8 cm left adnexal cyst. No right adnexal mass. The uterus is atrophic. Other: No free fluid or free intraperitoneal gas. No abdominal wall hernia. Musculoskeletal: There are no acute displaced fractures. 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 Reconstructed images demonstrate no additional findings. Review of the MIP images confirms the above findings. IMPRESSION: 1. Mottled punctate lucencies throughout the ribcage, new since prior study. Differential diagnosis would include metastatic disease versus multiple myeloma. Please correlate with laboratory analysis. 2. There are no acute displaced fractures. Chronic appearing healed bilateral rib fractures have developed in the interim since prior exam. 3. No evidence of pulmonary embolus. 4. Mosaic ground-glass attenuation within the lungs, most consistent with small airway disease. No dense airspace disease to suggest pneumonia. 5. Stable 4.8 cm simple appearing left adnexal cyst. A follow-up pelvic ultrasound was previously recommended based on PET scan findings. 6.  Aortic Atherosclerosis (ICD10-I70.0). Electronically Signed   By: Sharlet Salina M.D.   On: 07/30/2022 21:45   DG Thoracic Spine W/Swimmers  Result Date: 07/30/2022 CLINICAL DATA:  Mid back pain, pain under right breast for 9 months EXAM: THORACIC SPINE - 3 VIEWS COMPARISON:  07/19/2022 FINDINGS: Frontal and lateral views of the thoracic spine are obtained on 3 images. Chronic T11  compression deformity and vertebral augmentation unchanged since prior exam. There are no acute bony abnormalities. Stable mild diffuse thoracolumbar spondylosis. Paraspinal soft tissues are unremarkable.  Prior median sternotomy and aortic valve replacement. IMPRESSION: 1. Stable chronic T11 compression deformity and vertebral augmentation. 2. No acute bony abnormality. Electronically Signed   By: Sharlet Salina M.D.   On: 07/30/2022 17:21   DG Chest 2 View  Result Date: 07/30/2022 CLINICAL DATA:  Right-sided chest wall pain EXAM: CHEST - 2 VIEW COMPARISON:  06/18/2022 FINDINGS: Frontal and lateral views of the chest demonstrate stable postsurgical changes from median sternotomy and aortic valve replacement. The cardiac silhouette is stable. No acute airspace disease, effusion, or pneumothorax. Chronic wedge compression deformity and vertebral augmentation at the thoracolumbar junction. No acute bony abnormality. IMPRESSION: 1. No acute intrathoracic process. Electronically Signed   By: Sharlet Salina M.D.   On: 07/30/2022 17:20   DG Thoracic Spine W/Swimmers  Result Date: 07/22/2022 CLINICAL DATA:  Upper back pain.  No injury. EXAM: THORACIC SPINE - 3 VIEWS COMPARISON:  03/27/2022.  Thoracic MRI, 03/14/2022. FINDINGS: Previous vertebroplasty at T11, new since the prior MRI. Moderate wedge-shaped compression deformity of the treated T11 fracture. No acute fracture.  No bone lesion.  No spondylolisthesis. Mild disc degenerative changes along the mid to lower thoracic spine with anterior bridging osteophytes. IMPRESSION: 1. No acute fracture or acute finding.  No malalignment. 2. Treated T11 vertebral fracture. Electronically Signed   By: Amie Portland M.D.   On: 07/22/2022 08:49    ASSESSMENT AND PLAN: This is a very pleasant 82 years old white female with history of stage I (T1a, N0, M0) thymoma type AB diagnosed in October 2023 status post resection under the care of Dr. Dorris Fetch on January 28, 2022  with close resection margin and microscopic infiltration of the capsule. The patient also has recent findings on imaging studies as well as lab work concerning for underlying multiple myeloma. I personally and independently reviewed her scan images as well as the recent lab results.  She has significantly elevated total protein as well as the lytic lesions on the scan concerning for underlying multiple myeloma. I had a lengthy discussion with the patient and her daughter-in-law today about her current condition and possible treatment options. I ordered several studies to confirm her diagnosis of the multiple myeloma including whole-body PET scan in addition to myeloma panel.  I will also refer the patient to interventional radiology for consideration of CT-guided bone marrow biopsy and aspirate. I will see the patient back for follow-up visit in around 2 weeks for evaluation and more detailed discussion of her treatment options based on the biopsy and imaging studies as well as the lab work. For the anemia which could be secondary to her multiple myeloma, I will arrange for the patient to receive 1 unit of PRBCs transfusion in the next few days. For the hypertension the patient was advised to take her blood pressure medication as prescribed and to check it regularly at home and discussed with her primary care physician for adjustment of her medications. The patient was advised to call immediately if she has any other concerning symptoms in the interval. The patient voices understanding of current disease status and treatment options and is in agreement with the current care plan.  All questions were answered. The patient knows to call the clinic with any problems, questions or concerns. We can certainly see the patient much sooner if necessary.  The total time spent in the appointment was 55 minutes.  Disclaimer: This note was dictated with voice recognition software. Similar sounding words can  inadvertently be transcribed and may not be  corrected upon review.

## 2022-08-07 ENCOUNTER — Inpatient Hospital Stay: Payer: Medicare PPO | Attending: Internal Medicine

## 2022-08-07 DIAGNOSIS — C9000 Multiple myeloma not having achieved remission: Secondary | ICD-10-CM | POA: Diagnosis not present

## 2022-08-07 DIAGNOSIS — K2211 Ulcer of esophagus with bleeding: Secondary | ICD-10-CM | POA: Diagnosis present

## 2022-08-07 DIAGNOSIS — M8580 Other specified disorders of bone density and structure, unspecified site: Secondary | ICD-10-CM | POA: Diagnosis present

## 2022-08-07 DIAGNOSIS — Z882 Allergy status to sulfonamides status: Secondary | ICD-10-CM

## 2022-08-07 DIAGNOSIS — Z79899 Other long term (current) drug therapy: Secondary | ICD-10-CM | POA: Insufficient documentation

## 2022-08-07 DIAGNOSIS — E860 Dehydration: Secondary | ICD-10-CM | POA: Diagnosis present

## 2022-08-07 DIAGNOSIS — Z881 Allergy status to other antibiotic agents status: Secondary | ICD-10-CM

## 2022-08-07 DIAGNOSIS — E876 Hypokalemia: Secondary | ICD-10-CM | POA: Diagnosis not present

## 2022-08-07 DIAGNOSIS — G893 Neoplasm related pain (acute) (chronic): Secondary | ICD-10-CM | POA: Diagnosis present

## 2022-08-07 DIAGNOSIS — I129 Hypertensive chronic kidney disease with stage 1 through stage 4 chronic kidney disease, or unspecified chronic kidney disease: Secondary | ICD-10-CM | POA: Diagnosis present

## 2022-08-07 DIAGNOSIS — E871 Hypo-osmolality and hyponatremia: Secondary | ICD-10-CM | POA: Diagnosis present

## 2022-08-07 DIAGNOSIS — R627 Adult failure to thrive: Secondary | ICD-10-CM | POA: Diagnosis present

## 2022-08-07 DIAGNOSIS — Z6826 Body mass index (BMI) 26.0-26.9, adult: Secondary | ICD-10-CM

## 2022-08-07 DIAGNOSIS — Z953 Presence of xenogenic heart valve: Secondary | ICD-10-CM

## 2022-08-07 DIAGNOSIS — Z885 Allergy status to narcotic agent status: Secondary | ICD-10-CM

## 2022-08-07 DIAGNOSIS — M8448XA Pathological fracture, other site, initial encounter for fracture: Secondary | ICD-10-CM | POA: Diagnosis present

## 2022-08-07 DIAGNOSIS — Z880 Allergy status to penicillin: Secondary | ICD-10-CM

## 2022-08-07 DIAGNOSIS — Z91011 Allergy to milk products: Secondary | ICD-10-CM

## 2022-08-07 DIAGNOSIS — N1831 Chronic kidney disease, stage 3a: Secondary | ICD-10-CM | POA: Diagnosis present

## 2022-08-07 DIAGNOSIS — N179 Acute kidney failure, unspecified: Secondary | ICD-10-CM | POA: Diagnosis present

## 2022-08-07 DIAGNOSIS — D630 Anemia in neoplastic disease: Secondary | ICD-10-CM | POA: Diagnosis present

## 2022-08-07 DIAGNOSIS — Z7982 Long term (current) use of aspirin: Secondary | ICD-10-CM

## 2022-08-07 DIAGNOSIS — E8809 Other disorders of plasma-protein metabolism, not elsewhere classified: Secondary | ICD-10-CM | POA: Diagnosis present

## 2022-08-07 DIAGNOSIS — K2970 Gastritis, unspecified, without bleeding: Secondary | ICD-10-CM | POA: Diagnosis present

## 2022-08-07 DIAGNOSIS — I2510 Atherosclerotic heart disease of native coronary artery without angina pectoris: Secondary | ICD-10-CM | POA: Diagnosis present

## 2022-08-07 DIAGNOSIS — E7800 Pure hypercholesterolemia, unspecified: Secondary | ICD-10-CM | POA: Diagnosis present

## 2022-08-07 DIAGNOSIS — E43 Unspecified severe protein-calorie malnutrition: Secondary | ICD-10-CM | POA: Diagnosis present

## 2022-08-07 DIAGNOSIS — D649 Anemia, unspecified: Secondary | ICD-10-CM

## 2022-08-07 DIAGNOSIS — I82442 Acute embolism and thrombosis of left tibial vein: Secondary | ICD-10-CM | POA: Diagnosis present

## 2022-08-07 DIAGNOSIS — Z888 Allergy status to other drugs, medicaments and biological substances status: Secondary | ICD-10-CM

## 2022-08-07 DIAGNOSIS — Z66 Do not resuscitate: Secondary | ICD-10-CM | POA: Diagnosis not present

## 2022-08-07 DIAGNOSIS — L89156 Pressure-induced deep tissue damage of sacral region: Secondary | ICD-10-CM | POA: Diagnosis present

## 2022-08-07 DIAGNOSIS — K3189 Other diseases of stomach and duodenum: Secondary | ICD-10-CM | POA: Diagnosis present

## 2022-08-07 DIAGNOSIS — G928 Other toxic encephalopathy: Principal | ICD-10-CM | POA: Diagnosis present

## 2022-08-07 DIAGNOSIS — D6489 Other specified anemias: Secondary | ICD-10-CM | POA: Diagnosis present

## 2022-08-07 DIAGNOSIS — Z8249 Family history of ischemic heart disease and other diseases of the circulatory system: Secondary | ICD-10-CM

## 2022-08-07 DIAGNOSIS — D62 Acute posthemorrhagic anemia: Secondary | ICD-10-CM | POA: Diagnosis not present

## 2022-08-07 DIAGNOSIS — R54 Age-related physical debility: Secondary | ICD-10-CM | POA: Diagnosis present

## 2022-08-07 DIAGNOSIS — I272 Pulmonary hypertension, unspecified: Secondary | ICD-10-CM | POA: Diagnosis present

## 2022-08-07 DIAGNOSIS — T40605A Adverse effect of unspecified narcotics, initial encounter: Secondary | ICD-10-CM | POA: Diagnosis present

## 2022-08-07 LAB — KAPPA/LAMBDA LIGHT CHAINS
Kappa free light chain: 121.6 mg/L — ABNORMAL HIGH (ref 3.3–19.4)
Kappa, lambda light chain ratio: 10.86 — ABNORMAL HIGH (ref 0.26–1.65)
Lambda free light chains: 11.2 mg/L (ref 5.7–26.3)

## 2022-08-07 LAB — BPAM RBC: ISSUE DATE / TIME: 202405011438

## 2022-08-07 LAB — BETA 2 MICROGLOBULIN, SERUM: Beta-2 Microglobulin: 8.7 mg/L — ABNORMAL HIGH (ref 0.6–2.4)

## 2022-08-07 MED ORDER — SODIUM CHLORIDE 0.9 % IV SOLN: INTRAVENOUS | Status: DC

## 2022-08-07 MED ORDER — DIPHENHYDRAMINE HCL 25 MG PO CAPS
25.0000 mg | ORAL_CAPSULE | Freq: Once | ORAL | Status: AC
  Administered 2022-08-07: 25 mg via ORAL
  Filled 2022-08-07: qty 1

## 2022-08-07 MED ORDER — ACETAMINOPHEN 325 MG PO TABS
650.0000 mg | ORAL_TABLET | Freq: Once | ORAL | Status: AC
  Administered 2022-08-07: 650 mg via ORAL
  Filled 2022-08-07: qty 2

## 2022-08-07 NOTE — Patient Instructions (Signed)

## 2022-08-08 ENCOUNTER — Telehealth: Payer: Self-pay | Admitting: Internal Medicine

## 2022-08-08 LAB — TYPE AND SCREEN
Antibody Screen: NEGATIVE
Unit division: 0

## 2022-08-08 LAB — IGG, IGA, IGM
IgA: 64 mg/dL (ref 64–422)
IgG (Immunoglobin G), Serum: 6136 mg/dL — ABNORMAL HIGH (ref 586–1602)
IgM (Immunoglobulin M), Srm: 20 mg/dL — ABNORMAL LOW (ref 26–217)

## 2022-08-08 LAB — BPAM RBC: Blood Product Expiration Date: 202406032359

## 2022-08-08 NOTE — Telephone Encounter (Signed)
Scheduled per 04/30 los, patient has been called and notified of upcoming appointments. 

## 2022-08-12 ENCOUNTER — Other Ambulatory Visit: Payer: Self-pay | Admitting: Medical Oncology

## 2022-08-12 ENCOUNTER — Encounter: Payer: Self-pay | Admitting: Medical Oncology

## 2022-08-12 NOTE — Progress Notes (Signed)
Appts confirmed. 

## 2022-08-19 ENCOUNTER — Other Ambulatory Visit: Payer: Self-pay | Admitting: Radiology

## 2022-08-19 DIAGNOSIS — D472 Monoclonal gammopathy: Secondary | ICD-10-CM

## 2022-08-19 NOTE — Consult Note (Signed)
Chief Complaint: Patient was seen in consultation today for CT-guided bone marrow biopsy  Referring Physician(s): Mohamed,Mohamed  Supervising Physician: Mir, Secondary school teacher  Patient Status: [facility] - Out-pt  History of Present Illness: [name] is an 82 y.o. 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  She now presents with findings on imaging studies as well as lab work concerning for underlying multiple myeloma.  She is scheduled today for CT-guided bone marrow biopsy for further evaluation.  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 by echo 10/2021   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/2015   Carotid artery stenosis    < 50% stenosis bilaterally by doppler 07/2016   Coronary artery disease 2008   Coronary Ca score of 331 with minimal multivessel plaque < 25% stenosis by coronary CTA 8/23   Heart murmur    per pt   Hypercholesteremia    Hypertension    Obesity    Osteopenia    declines treatment   Pneumonia 1995   Pulmonary HTN (HCC)    mild to moderate by echo 7/23 with PASP   Shoulder pain    Due to arthritis   Vitamin D insufficiency     Past Surgical History:  Procedure Laterality Date   AORTIC VALVE REPLACEMENT N/A 10/14/2013   Procedure: AORTIC VALVE REPLACEMENT (AVR);  Surgeon: Alleen Borne, MD;  Location: [facility] OR;  Service: Open Heart Surgery;  Laterality: N/A;   CARDIAC CATHETERIZATION     CATARACT EXTRACTION, BILATERAL      CHOLECYSTECTOMY  04/08/1988   INTRAOPERATIVE TRANSESOPHAGEAL ECHOCARDIOGRAM N/A 10/14/2013   Procedure: INTRAOPERATIVE TRANSESOPHAGEAL ECHOCARDIOGRAM;  Surgeon: Alleen Borne, MD;  Location: [facility] OR;  Service: Open Heart Surgery;  Laterality: N/A;   KYPHOPLASTY Bilateral 03/27/2022   Procedure: KYPHOPLASTY AND BIOPSY THORACIC ELEVEN;  Surgeon: Lisbeth Renshaw, MD;  Location: [facility] OR;  Service: Neurosurgery;  Laterality: Bilateral;   LEFT AND RIGHT HEART CATHETERIZATION WITH CORONARY ANGIOGRAM N/A 09/16/2013   Procedure: LEFT AND RIGHT HEART CATHETERIZATION WITH CORONARY ANGIOGRAM;  Surgeon: Quintella Reichert, MD;  Location: [facility] CATH LAB;  Service: Cardiovascular;  Laterality: N/A;   TONSILLECTOMY      Allergies: Crestor [rosuvastatin calcium], Lactose, Lipitor [atorvastatin], Pravastatin, Simvastatin, Tramadol, Zetia [ezetimibe], Codeine, Penicillins, Sulfa antibiotics, and Vancomycin  Medications: Prior to Admission medications   Medication Sig Start Date End Date Taking? Authorizing Provider  carboxymethylcellulose (REFRESH PLUS) 0.5 % SOLN Place 1 drop into both eyes daily as needed (dry/irritated eyes).    [provider]  hydrochlorothiazide (MICROZIDE) 12.5 MG capsule Take 1 capsule (12.5 mg total) by mouth daily. 11/19/21   Quintella Reichert, MD  HYDROcodone-acetaminophen (NORCO/VICODIN) 5-325 MG tablet Take 1 tablet by mouth every 6 (six) hours as needed for severe pain. 07/30/22   Gwyneth Sprout, MD  lisinopril (ZESTRIL) 10 MG tablet Take 1 tablet by mouth once daily 11/13/21   Quintella Reichert, MD     Family History  Problem Relation Age of Onset  Dementia Mother    Heart disease Father    Heart attack Father    Heart disease Brother    Heart attack Brother    Arrhythmia Sister    Heart attack Maternal Grandfather     Social History   Socioeconomic History   Marital status: Widowed    Spouse name: Not on file   Number of children: Not on file   Years of  education: Not on file   Highest education level: Not on file  Occupational History   Not on file  Tobacco Use   Smoking status: Never   Smokeless tobacco: Never  Vaping Use   Vaping Use: Never used  Substance and Sexual Activity   Alcohol use: No   Drug use: No   Sexual activity: Not Currently  Other Topics Concern   Not on file  Social History Narrative   Not on file   Social Determinants of Health   Financial Resource Strain: Not on file  Food Insecurity: No Food Insecurity (01/28/2022)   Hunger Vital Sign    Worried About Running Out of Food in the Last Year: Never true    Ran Out of Food in the Last Year: Never true  Transportation Needs: No Transportation Needs (01/28/2022)   PRAPARE - Administrator, Civil Service (Medical): No    Lack of Transportation (Non-Medical): No  Physical Activity: Not on file  Stress: Not on file  Social Connections: Not on file      Review of Systems  Vital Signs:   Code Status:    Physical Exam  Imaging: CT Angio Chest PE W and/or Wo Contrast  Result Date: 07/30/2022 CLINICAL DATA:  Right-sided abdominal pain, right-sided chest wall pain EXAM: CT ANGIOGRAPHY CHEST CT ABDOMEN AND PELVIS WITH CONTRAST TECHNIQUE: Multidetector CT imaging of the chest was performed using the standard protocol during bolus administration of intravenous contrast. Multiplanar CT image reconstructions and MIPs were obtained to evaluate the vascular anatomy. Multidetector CT imaging of the abdomen and pelvis was performed using the standard protocol during bolus administration of intravenous contrast. RADIATION DOSE REDUCTION: This exam was performed according to the departmental dose-optimization program which includes automated exposure control, adjustment of the mA and/or kV according to patient size and/or use of iterative reconstruction technique. CONTRAST:  80mL OMNIPAQUE IOHEXOL 350 MG/ML SOLN COMPARISON:  07/26/2022, 11/28/2021, 11/16/2021  FINDINGS: CTA CHEST FINDINGS Cardiovascular: This is a technically adequate evaluation of the pulmonary vasculature. No filling defects or pulmonary emboli. Prominent left atrial dilatation. No pericardial effusion. Aortic valve prosthesis. Atherosclerosis of the coronary vasculature most pronounced in the LAD distribution. Ectasia of the ascending thoracic aorta measuring up to 3.9 cm in diameter. No evidence of dissection. Atherosclerosis most pronounced in the aortic arch and descending thoracic aorta. Mediastinum/Nodes: No enlarged mediastinal, hilar, or axillary lymph nodes. Thyroid gland, trachea, and esophagus demonstrate no significant findings. The soft tissue mediastinal mass seen adjacent to the pulmonary artery on prior exam has resolved or been resected in the interim. Lungs/Pleura: Mild bibasilar atelectasis, left greater than right. Mosaic areas of geographic ground-glass attenuation within the lungs consistent with small airway disease. No dense consolidation, effusion, or pneumothorax. The central airways are patent. Musculoskeletal: Mottled lucencies are seen throughout the thoracic cage, most pronounced within the left eighth and ninth ribs posteriorly, new since prior study. Chronic appearing left fourth and right eighth rib fractures, also new since the previous exam is. There are no acute displaced fractures. Chronic T11 compression deformity  with vertebral augmentation. Reconstructed images demonstrate no additional findings. Review of the MIP images confirms the above findings. CT ABDOMEN and PELVIS FINDINGS Hepatobiliary: No focal liver abnormality is seen. Status post cholecystectomy. No biliary dilatation. Pancreas: Unremarkable. No pancreatic ductal dilatation or surrounding inflammatory changes. Spleen: Normal in size without focal abnormality. Adrenals/Urinary Tract: There are no acute renal findings. The adrenals are stable. Bladder is unremarkable. Stomach/Bowel: No bowel obstruction  or ileus. Normal appendix right lower quadrant. No bowel wall thickening or inflammatory change. Vascular/Lymphatic: Atherosclerosis of the aorta and its branches. No pathologic adenopathy within the abdomen or pelvis. Reproductive: Stable simple appearing 3.3 x 4.8 cm left adnexal cyst. No right adnexal mass. The uterus is atrophic. Other: No free fluid or free intraperitoneal gas. No abdominal wall hernia. Musculoskeletal: There are no acute displaced fractures. 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 Reconstructed images demonstrate no additional findings. Review of the MIP images confirms the above findings. IMPRESSION: 1. Mottled punctate lucencies throughout the ribcage, new since prior study. Differential diagnosis would include metastatic disease versus multiple myeloma. Please correlate with laboratory analysis. 2. There are no acute displaced fractures. Chronic appearing healed bilateral rib fractures have developed in the interim since prior exam. 3. No evidence of pulmonary embolus. 4. Mosaic ground-glass attenuation within the lungs, most consistent with small airway disease. No dense airspace disease to suggest pneumonia. 5. Stable 4.8 cm simple appearing left adnexal cyst. A follow-up pelvic ultrasound was previously recommended based on PET scan findings. 6.  Aortic Atherosclerosis (ICD10-I70.0). Electronically Signed   By: Sharlet Salina M.D.   On: 07/30/2022 21:45   CT ABDOMEN PELVIS W CONTRAST  Result Date: 07/30/2022 CLINICAL DATA:  Right-sided abdominal pain, right-sided chest wall pain EXAM: CT ANGIOGRAPHY CHEST CT ABDOMEN AND PELVIS WITH CONTRAST TECHNIQUE: Multidetector CT imaging of the chest was performed using the standard protocol during bolus administration of intravenous contrast. Multiplanar CT image reconstructions and MIPs were obtained to evaluate the vascular anatomy. Multidetector CT imaging of the abdomen and pelvis was performed using the  standard protocol during bolus administration of intravenous contrast. RADIATION DOSE REDUCTION: This exam was performed according to the departmental dose-optimization program which includes automated exposure control, adjustment of the mA and/or kV according to patient size and/or use of iterative reconstruction technique. CONTRAST:  80mL OMNIPAQUE IOHEXOL 350 MG/ML SOLN COMPARISON:  07/26/2022, 11/28/2021, 11/16/2021 FINDINGS: CTA CHEST FINDINGS Cardiovascular: This is a technically adequate evaluation of the pulmonary vasculature. No filling defects or pulmonary emboli. Prominent left atrial dilatation. No pericardial effusion. Aortic valve prosthesis. Atherosclerosis of the coronary vasculature most pronounced in the LAD distribution. Ectasia of the ascending thoracic aorta measuring up to 3.9 cm in diameter. No evidence of dissection. Atherosclerosis most pronounced in the aortic arch and descending thoracic aorta. Mediastinum/Nodes: No enlarged mediastinal, hilar, or axillary lymph nodes. Thyroid gland, trachea, and esophagus demonstrate no significant findings. The soft tissue mediastinal mass seen adjacent to the pulmonary artery on prior exam has resolved or been resected in the interim. Lungs/Pleura: Mild bibasilar atelectasis, left greater than right. Mosaic areas of geographic ground-glass attenuation within the lungs consistent with small airway disease. No dense consolidation, effusion, or pneumothorax. The central airways are patent. Musculoskeletal: Mottled lucencies are seen throughout the thoracic cage, most pronounced within the left eighth and ninth ribs posteriorly, new since prior study. Chronic appearing left fourth and right eighth rib fractures, also new since the previous exam is. There are no acute displaced fractures. Chronic  T11 compression deformity with vertebral augmentation. Reconstructed images demonstrate no additional findings. Review of the MIP images confirms the above  findings. CT ABDOMEN and PELVIS FINDINGS Hepatobiliary: No focal liver abnormality is seen. Status post cholecystectomy. No biliary dilatation. Pancreas: Unremarkable. No pancreatic ductal dilatation or surrounding inflammatory changes. Spleen: Normal in size without focal abnormality. Adrenals/Urinary Tract: There are no acute renal findings. The adrenals are stable. Bladder is unremarkable. Stomach/Bowel: No bowel obstruction or ileus. Normal appendix right lower quadrant. No bowel wall thickening or inflammatory change. Vascular/Lymphatic: Atherosclerosis of the aorta and its branches. No pathologic adenopathy within the abdomen or pelvis. Reproductive: Stable simple appearing 3.3 x 4.8 cm left adnexal cyst. No right adnexal mass. The uterus is atrophic. Other: No free fluid or free intraperitoneal gas. No abdominal wall hernia. Musculoskeletal: There are no acute displaced fractures. 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 Reconstructed images demonstrate no additional findings. Review of the MIP images confirms the above findings. IMPRESSION: 1. Mottled punctate lucencies throughout the ribcage, new since prior study. Differential diagnosis would include metastatic disease versus multiple myeloma. Please correlate with laboratory analysis. 2. There are no acute displaced fractures. Chronic appearing healed bilateral rib fractures have developed in the interim since prior exam. 3. No evidence of pulmonary embolus. 4. Mosaic ground-glass attenuation within the lungs, most consistent with small airway disease. No dense airspace disease to suggest pneumonia. 5. Stable 4.8 cm simple appearing left adnexal cyst. A follow-up pelvic ultrasound was previously recommended based on PET scan findings. 6.  Aortic Atherosclerosis (ICD10-I70.0). Electronically Signed   By: Sharlet Salina M.D.   On: 07/30/2022 21:45   DG Thoracic Spine W/Swimmers  Result Date: 07/30/2022 CLINICAL DATA:  Mid  back pain, pain under right breast for 9 months EXAM: THORACIC SPINE - 3 VIEWS COMPARISON:  07/19/2022 FINDINGS: Frontal and lateral views of the thoracic spine are obtained on 3 images. Chronic T11 compression deformity and vertebral augmentation unchanged since prior exam. There are no acute bony abnormalities. Stable mild diffuse thoracolumbar spondylosis. Paraspinal soft tissues are unremarkable. Prior median sternotomy and aortic valve replacement. IMPRESSION: 1. Stable chronic T11 compression deformity and vertebral augmentation. 2. No acute bony abnormality. Electronically Signed   By: Sharlet Salina M.D.   On: 07/30/2022 17:21   DG Chest 2 View  Result Date: 07/30/2022 CLINICAL DATA:  Right-sided chest wall pain EXAM: CHEST - 2 VIEW COMPARISON:  06/18/2022 FINDINGS: Frontal and lateral views of the chest demonstrate stable postsurgical changes from median sternotomy and aortic valve replacement. The cardiac silhouette is stable. No acute airspace disease, effusion, or pneumothorax. Chronic wedge compression deformity and vertebral augmentation at the thoracolumbar junction. No acute bony abnormality. IMPRESSION: 1. No acute intrathoracic process. Electronically Signed   By: Sharlet Salina M.D.   On: 07/30/2022 17:20    Labs:  CBC: Recent Labs    02/25/22 0855 03/15/22 1738 07/30/22 1706 08/06/22 1330  WBC 9.8 8.1 12.5* 14.5*  HGB 11.6* 11.6* 8.0* 7.6*  HCT 34.9* 35.2* 25.1* 23.0*  PLT 212 228 333 326    COAGS: Recent Labs    01/24/22 1407  INR 1.0  APTT 27    BMP: Recent Labs    02/25/22 0855 03/15/22 1738 07/30/22 2000 08/06/22 1330  NA 140 138 132* 131*  K 4.1 3.4* 2.9* 3.2*  CL 104 100 101 99  CO2 32 27 28 31   GLUCOSE 104* 100* 109* 119*  BUN 17 16 24* 29*  CALCIUM 9.8  9.6 8.8* 10.1  CREATININE 0.93 0.79 1.01* 1.18*  GFRNONAA >60 >60 55* 46*    LIVER FUNCTION TESTS: Recent Labs    02/25/22 0855 03/15/22 1738 07/30/22 2000 08/06/22 1330  BILITOT 0.6 0.6  0.5 0.3  AST 15 20 22 24   ALT 14 19 18 20   ALKPHOS 83 85 94 100  PROT 8.0 7.8 10.1* 10.2*  ALBUMIN 4.0 4.1 2.1* 2.5*    TUMOR MARKERS: No results for input(s): "AFPTM", "CEA", "CA199", "CHROMGRNA" in the last 8760 hours.  Assessment and Plan: 82 y.o. 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  She now presents with findings on imaging studies as well as lab work concerning for underlying multiple myeloma.  She is scheduled today for CT-guided bone marrow biopsy for further evaluation.Risks and benefits of procedure was discussed with the patient including, but not limited to bleeding, infection, damage to adjacent structures or low yield requiring additional tests.  All of the questions were answered and there is agreement to proceed.  Consent signed and in chart.    Thank you for this interesting consult.  I greatly enjoyed meeting [name] and look forward to participating in their care.  A copy of this report was sent to the requesting provider on this date.  Electronically Signed: D. Jeananne Rama, PA-C 08/19/2022, 1:09 PM   I spent a total of 20 minutes    in face to face in clinical consultation, greater than 50% of which was counseling/coordinating care for CT-guided bone marrow biopsy

## 2022-08-20 ENCOUNTER — Encounter (HOSPITAL_COMMUNITY): Payer: Self-pay

## 2022-08-20 ENCOUNTER — Ambulatory Visit (HOSPITAL_COMMUNITY)
Admission: RE | Admit: 2022-08-20 | Discharge: 2022-08-20 | Disposition: A | Discharge status: Home / Self Care | Payer: Medicare PPO | Source: Ambulatory Visit | Attending: Internal Medicine | Admitting: Internal Medicine

## 2022-08-20 ENCOUNTER — Other Ambulatory Visit: Payer: Medicare PPO

## 2022-08-20 ENCOUNTER — Other Ambulatory Visit: Payer: Self-pay

## 2022-08-20 ENCOUNTER — Ambulatory Visit: Payer: Medicare PPO | Admitting: Internal Medicine

## 2022-08-20 ENCOUNTER — Telehealth: Payer: Self-pay | Admitting: Medical Oncology

## 2022-08-20 DIAGNOSIS — C9000 Multiple myeloma not having achieved remission: Secondary | ICD-10-CM | POA: Diagnosis not present

## 2022-08-20 DIAGNOSIS — D472 Monoclonal gammopathy: Secondary | ICD-10-CM

## 2022-08-20 LAB — CBC WITH DIFFERENTIAL/PLATELET
Abs Immature Granulocytes: 0.24 10*3/uL — ABNORMAL HIGH (ref 0.00–0.07)
Basophils Absolute: 0 10*3/uL (ref 0.0–0.1)
Basophils Relative: 0 %
Eosinophils Absolute: 0 10*3/uL (ref 0.0–0.5)
Eosinophils Relative: 0 %
HCT: 27.4 % — ABNORMAL LOW (ref 36.0–46.0)
Hemoglobin: 8.8 g/dL — ABNORMAL LOW (ref 12.0–15.0)
Immature Granulocytes: 2 %
Lymphocytes Relative: 22 %
Lymphs Abs: 2.8 10*3/uL (ref 0.7–4.0)
MCH: 30.4 pg (ref 26.0–34.0)
MCHC: 32.1 g/dL (ref 30.0–36.0)
MCV: 94.8 fL (ref 80.0–100.0)
Monocytes Absolute: 1 10*3/uL (ref 0.1–1.0)
Monocytes Relative: 8 %
Neutro Abs: 8.6 10*3/uL — ABNORMAL HIGH (ref 1.7–7.7)
Neutrophils Relative %: 68 %
Platelets: 335 10*3/uL (ref 150–400)
RBC: 2.89 MIL/uL — ABNORMAL LOW (ref 3.87–5.11)
RDW: 14.6 % (ref 11.5–15.5)
WBC: 12.7 10*3/uL — ABNORMAL HIGH (ref 4.0–10.5)
nRBC: 0 % (ref 0.0–0.2)

## 2022-08-20 MED ORDER — FENTANYL CITRATE (PF) 100 MCG/2ML IJ SOLN
INTRAMUSCULAR | Status: AC
  Filled 2022-08-20: qty 2

## 2022-08-20 MED ORDER — MIDAZOLAM HCL 2 MG/2ML IJ SOLN
INTRAMUSCULAR | Status: AC
  Filled 2022-08-20: qty 4

## 2022-08-20 MED ORDER — MIDAZOLAM HCL 2 MG/2ML IJ SOLN
INTRAMUSCULAR | Status: AC | PRN
  Administered 2022-08-20: .5 mg via INTRAVENOUS

## 2022-08-20 MED ORDER — LIDOCAINE HCL 1 % IJ SOLN
INTRAMUSCULAR | Status: AC | PRN
  Administered 2022-08-20: 20 mL

## 2022-08-20 MED ORDER — SODIUM CHLORIDE 0.9 % IV SOLN: INTRAVENOUS | Status: DC

## 2022-08-20 MED ORDER — FENTANYL CITRATE (PF) 100 MCG/2ML IJ SOLN
INTRAMUSCULAR | Status: AC | PRN
  Administered 2022-08-20 (×3): 25 ug via INTRAVENOUS

## 2022-08-20 NOTE — Procedures (Signed)
Interventional Radiology Procedure Note  Indication: MGUS  Procedure: CT guided aspirate and core biopsy of right iliac bone  Complications: None  Bleeding: Minimal  Yliana Gravois, MD [phone]   

## 2022-08-20 NOTE — Telephone Encounter (Signed)
Pain management-LVM for son that Dr. Arbutus Ped is reviewing her scan and may refer her to radiation .Dr. Arbutus Ped said she can take hydrocodone 2 tablets every 6 hours prn. I did recommend  she start with 1 and 1/2 tablet *( 7.5 mg) every 6  hours prn for her pain.   I spoke to pt and told her the pain she is experiencing may  also be from [facility] bx procedure today . I  and told her it may feel like she has been kicked in the back . She said " well, the pain sort of feels  like that". She tried a heating pad but said on the warm setting it felt ike her back was on fire so she stopped using it. I instructed her to take the hydrocodone now for the pain.

## 2022-08-20 NOTE — Discharge Instructions (Addendum)
Bone Marrow Aspiration and Bone Marrow Biopsy, Adult, Care After  The following information offers guidance on how to care for yourself after your procedure. Your health care provider may also give you more specific instructions. If you have problems or questions, contact your health care provider.  What can I expect after the procedure?  May remove dressing or bandaid and shower tomorrow.  Keep site clean and dry. Replace with clean dressing or bandaid as necessary. Urgent needs IR clinic [phone] (mon-fri 8-5).  After the procedure, it is common to have: Mild pain and tenderness. Swelling. Bruising. Follow these instructions at home: Incision care  Follow instructions from your health care provider about how to take care of the incision site. Make sure you: Wash your hands with soap and water for at least 20 seconds before and after you change your bandage (dressing). If soap and water are not available, use hand sanitizer. Change your dressing as told by your health care provider. Leave stitches (sutures), skin glue, or adhesive strips in place. These skin closures may need to stay in place for 2 weeks or longer. If adhesive strip edges start to loosen and curl up, you may trim the loose edges. Do not remove adhesive strips completely unless your health care provider tells you to do that. Check your incision site every day for signs of infection. Check for: More redness, swelling, or pain. Fluid or blood. Warmth. Pus or a bad smell. Activity Return to your normal activities as told by your health care provider. Ask your health care provider what activities are safe for you. Do not lift anything that is heavier than 10 lb (4.5 kg), or the limit that you are told, until your health care provider says that it is safe. If you were given a sedative during the procedure, it can affect you for  several hours. Do not drive or operate machinery until your health care provider says that it is safe. General instructions  Take over-the-counter and prescription medicines only as told by your health care provider. Do not take baths, swim, or use a hot tub until your health care provider approves. Ask your health care provider if you may take showers. You may only be allowed to take sponge baths. If directed, put ice on the affected area. To do this: Put ice in a plastic bag. Place a towel between your skin and the bag. Leave the ice on for 20 minutes, 2-3 times a day. If your skin turns bright red, remove the ice right away to prevent skin damage. The risk of skin damage is higher if you cannot feel pain, heat, or cold. Contact a health care provider if: You have signs of infection. Your pain is not controlled with medicine. You have cancer, and a temperature of 100.4F (38C) or higher. Get help right away if: You have a temperature of 101F (38.3C) or higher, or as told by your health care provider. You have bleeding from the incision site that cannot be controlled. This information is not intended to replace advice given to you by your health care provider. Make sure you discuss any questions you have with your health care provider. Document Revised: 07/30/2021 Document Reviewed: 07/30/2021 Elsevier Patient Education  2023 Elsevier Inc.                            Moderate Conscious Sedation, Adult, Care After  This sheet gives you information about how to care   for yourself after your procedure. Your health care provider may also give you more specific instructions. If you have problems or questions, contact your health care provider. What can I expect after the procedure? After the procedure, it is common to have: Sleepiness for several hours. Impaired judgment for several hours. Difficulty with balance. Vomiting if you eat too soon. Follow these instructions at home: For  the time period you were told by your health care provider:     Rest. Do not participate in activities where you could fall or become injured. Do not drive or use machinery. Do not drink alcohol. Do not take sleeping pills or medicines that cause drowsiness. Do not make important decisions or sign legal documents. Do not take care of children on your own. Eating and drinking  Follow the diet recommended by your health care provider. Drink enough fluid to keep your urine pale yellow. If you vomit: Drink water, juice, or soup when you can drink without vomiting. Make sure you have little or no nausea before eating solid foods. General instructions Take over-the-counter and prescription medicines only as told by your health care provider. Have a responsible adult stay with you for the time you are told. It is important to have someone help care for you until you are awake and alert. Do not smoke. Keep all follow-up visits as told by your health care provider. This is important. Contact a health care provider if: You are still sleepy or having trouble with balance after 24 hours. You feel light-headed. You keep feeling nauseous or you keep vomiting. You develop a rash. You have a fever. You have redness or swelling around the IV site. Get help right away if: You have trouble breathing. You have new-onset confusion at home. Summary After the procedure, it is common to feel sleepy, have impaired judgment, or feel nauseous if you eat too soon. Rest after you get home. Know the things you should not do after the procedure. Follow the diet recommended by your health care provider and drink enough fluid to keep your urine pale yellow. Get help right away if you have trouble breathing or new-onset confusion at home. This information is not intended to replace advice given to you by your health care provider. Make sure you discuss any questions you have with your health care  provider. Document Revised: 07/23/2019 Document Reviewed: 02/18/2019 Elsevier Patient Education  2023 Elsevier Inc.  

## 2022-08-20 NOTE — Telephone Encounter (Signed)
increasing pain in her ribs and back.Reqeusts stronger pain med.  On 04/30 ED prescribed Hydrocodone for the pain because Tylenol and ibuprofen were  causing abd pain and vomiting.    CT scan> " mottled punctate lucencies throughout the rib cage new since prior studies concern for metastatic disease versus multiple myeloma."

## 2022-08-21 ENCOUNTER — Other Ambulatory Visit: Payer: Self-pay | Admitting: Internal Medicine

## 2022-08-21 ENCOUNTER — Telehealth: Payer: Self-pay | Admitting: Medical Oncology

## 2022-08-21 DIAGNOSIS — J9859 Other diseases of mediastinum, not elsewhere classified: Secondary | ICD-10-CM

## 2022-08-21 MED ORDER — PROCHLORPERAZINE MALEATE 10 MG PO TABS: 10.0000 mg | ORAL_TABLET | Freq: Four times a day (QID) | ORAL | 0 refills | Status: DC | PRN

## 2022-08-21 MED ORDER — MORPHINE SULFATE ER 15 MG PO TBCR: 30.0000 mg | EXTENDED_RELEASE_TABLET | Freq: Two times a day (BID) | ORAL | 0 refills | Status: DC

## 2022-08-21 NOTE — Telephone Encounter (Signed)
Pain Management  and Nausea-  pain It is still present and in the same location , ribs and around to back . Took 2 hydrocodone this am and it did not help.   Requests something for nausea , which started before she took hydrocodone.

## 2022-08-21 NOTE — Addendum Note (Signed)
Addended by: Charma Igo on: 08/21/2022 05:00 PM   Modules accepted: Orders

## 2022-08-21 NOTE — Telephone Encounter (Addendum)
Per Dr Arbutus Ped , I told son that he  sent MS Contin and compazine to her pharmacy. Please refer to palliative care if not done. Thank you  Palliative care referral ordered.

## 2022-08-22 ENCOUNTER — Other Ambulatory Visit: Payer: Medicare PPO

## 2022-08-22 ENCOUNTER — Inpatient Hospital Stay: Payer: Medicare PPO

## 2022-08-22 ENCOUNTER — Other Ambulatory Visit: Payer: Self-pay

## 2022-08-22 ENCOUNTER — Ambulatory Visit: Payer: Medicare PPO | Admitting: Internal Medicine

## 2022-08-22 ENCOUNTER — Telehealth: Payer: Self-pay | Admitting: Nurse Practitioner

## 2022-08-22 ENCOUNTER — Ambulatory Visit (HOSPITAL_COMMUNITY)
Admission: RE | Admit: 2022-08-22 | Discharge: 2022-08-22 | Disposition: A | Discharge status: Home / Self Care | Payer: Medicare PPO | Source: Ambulatory Visit | Attending: Internal Medicine | Admitting: Internal Medicine

## 2022-08-22 ENCOUNTER — Telehealth: Payer: Self-pay | Admitting: Medical Oncology

## 2022-08-22 DIAGNOSIS — C801 Malignant (primary) neoplasm, unspecified: Secondary | ICD-10-CM | POA: Diagnosis not present

## 2022-08-22 DIAGNOSIS — D472 Monoclonal gammopathy: Secondary | ICD-10-CM | POA: Diagnosis not present

## 2022-08-22 DIAGNOSIS — C9000 Multiple myeloma not having achieved remission: Secondary | ICD-10-CM | POA: Diagnosis not present

## 2022-08-22 DIAGNOSIS — C7951 Secondary malignant neoplasm of bone: Secondary | ICD-10-CM | POA: Diagnosis not present

## 2022-08-22 LAB — CBC WITH DIFFERENTIAL (CANCER CENTER ONLY)
Abs Immature Granulocytes: 0.14 10*3/uL — ABNORMAL HIGH (ref 0.00–0.07)
Basophils Absolute: 0 10*3/uL (ref 0.0–0.1)
Basophils Relative: 0 %
Eosinophils Absolute: 0 10*3/uL (ref 0.0–0.5)
Eosinophils Relative: 0 %
HCT: 25.7 % — ABNORMAL LOW (ref 36.0–46.0)
Hemoglobin: 8.3 g/dL — ABNORMAL LOW (ref 12.0–15.0)
Immature Granulocytes: 1 %
Lymphocytes Relative: 29 %
Lymphs Abs: 3.7 10*3/uL (ref 0.7–4.0)
MCH: 30.2 pg (ref 26.0–34.0)
MCHC: 32.3 g/dL (ref 30.0–36.0)
MCV: 93.5 fL (ref 80.0–100.0)
Monocytes Absolute: 1 10*3/uL (ref 0.1–1.0)
Monocytes Relative: 8 %
Neutro Abs: 8 10*3/uL — ABNORMAL HIGH (ref 1.7–7.7)
Neutrophils Relative %: 62 %
Platelet Count: 341 10*3/uL (ref 150–400)
RBC: 2.75 MIL/uL — ABNORMAL LOW (ref 3.87–5.11)
RDW: 14.7 % (ref 11.5–15.5)
WBC Count: 12.9 10*3/uL — ABNORMAL HIGH (ref 4.0–10.5)
nRBC: 0 % (ref 0.0–0.2)

## 2022-08-22 LAB — CMP (CANCER CENTER ONLY)
ALT: 24 U/L (ref 0–44)
AST: 33 U/L (ref 15–41)
Albumin: 2.3 g/dL — ABNORMAL LOW (ref 3.5–5.0)
Alkaline Phosphatase: 153 U/L — ABNORMAL HIGH (ref 38–126)
Anion gap: 3 — ABNORMAL LOW (ref 5–15)
BUN: 27 mg/dL — ABNORMAL HIGH (ref 8–23)
CO2: 30 mmol/L (ref 22–32)
Calcium: 9.8 mg/dL (ref 8.9–10.3)
Chloride: 97 mmol/L — ABNORMAL LOW (ref 98–111)
Creatinine: 1.03 mg/dL — ABNORMAL HIGH (ref 0.44–1.00)
GFR, Estimated: 54 mL/min — ABNORMAL LOW (ref >60)
Glucose, Bld: 84 mg/dL (ref 70–99)
Potassium: 3 mmol/L — ABNORMAL LOW (ref 3.5–5.1)
Sodium: 130 mmol/L — ABNORMAL LOW (ref 135–145)
Total Bilirubin: 0.4 mg/dL (ref 0.3–1.2)
Total Protein: 11.5 g/dL — ABNORMAL HIGH (ref 6.5–8.1)

## 2022-08-22 LAB — GLUCOSE, CAPILLARY: Glucose-Capillary: 82 mg/dL (ref 70–99)

## 2022-08-22 LAB — LACTATE DEHYDROGENASE: LDH: 120 U/L (ref 98–192)

## 2022-08-22 LAB — SURGICAL PATHOLOGY

## 2022-08-22 MED ORDER — FLUDEOXYGLUCOSE F - 18 (FDG) INJECTION
8.0000 | Freq: Once | INTRAVENOUS | Status: AC | PRN
  Administered 2022-08-22: 7.56 via INTRAVENOUS

## 2022-08-22 NOTE — Telephone Encounter (Signed)
Per Dr. Arbutus Ped , [name] was notified to tell pt to "Please encourage her to increase her potassium rich diet."

## 2022-08-22 NOTE — Telephone Encounter (Signed)
Son said pt completed PET scan . She did not start the [facility] . She is going to take a compazine now and start the [facility] later today.

## 2022-08-23 ENCOUNTER — Other Ambulatory Visit: Payer: Self-pay

## 2022-08-23 DIAGNOSIS — J9859 Other diseases of mediastinum, not elsewhere classified: Secondary | ICD-10-CM

## 2022-08-23 DIAGNOSIS — D472 Monoclonal gammopathy: Secondary | ICD-10-CM

## 2022-08-25 NOTE — Progress Notes (Unsigned)
[facility] OFFICE PROGRESS NOTE  [name], MD [address] [place] [address]  DIAGNOSIS:  1) multiple myeloma.  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 Dorris Fetch on January 28, 2022.   CURRENT THERAPY: ***  INTERVAL HISTORY: [name] 82 y.o. female returns to the clinic today for follow-up visit.  The patient last saw Dr. Arbutus Ped on 08/06/2022.  At that time, she had been endorsing back and rib pain and imaging performed in the emergency room showed lytic lesions suspicious for multiple myeloma.  Therefore Dr. Arbutus Ped arrange for PET scan and bone marrow biopsy.  Since last being seen the patient denies any major changes in her health.  She will  establish care with palliative care on 5/30 for her cancer related pain.  She is currently taking MS vs norcofor pain control.  She denies any fever, chills, or night sweats.  Weight loss and appetite?  She continues to have fatigue and weakness.  She had blood transfusion arranged at her last appointment.  She denies any signs and symptoms of infection including sore throat, nasal congestion, cough, skin infections, dysuria, diarrhea, or abdominal pain.  She denies any abnormal bleeding or bruising.  She is here today to review the results of her bone marrow biopsy and PET scan and for more detailed discussion about her current condition and recommended treatment options.  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 by echo 10/2021   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/2015   Carotid artery  stenosis    < 50% stenosis bilaterally by doppler 07/2016   Coronary artery disease 2008   Coronary Ca score of 331 with minimal multivessel plaque < 25% stenosis by coronary CTA 8/23   Heart murmur    per pt   Hypercholesteremia    Hypertension    Obesity    Osteopenia    declines treatment   Pneumonia 1995   Pulmonary HTN (HCC)    mild to moderate by echo 7/23 with PASP   Shoulder pain    Due to arthritis   Vitamin D insufficiency     ALLERGIES:  is allergic to crestor [rosuvastatin calcium], lactose, lipitor [atorvastatin], pravastatin, simvastatin, tramadol, zetia [ezetimibe], codeine, penicillins, sulfa antibiotics, and vancomycin.  MEDICATIONS:  Current Outpatient Medications  Medication Sig Dispense Refill   morphine (MS CONTIN) 15 MG 12 hr tablet Take 2 tablets (30 mg total) by mouth every 12 (twelve) hours. 60 tablet 0   prochlorperazine (COMPAZINE) 10 MG tablet Take 1 tablet (10 mg total) by mouth every 6 (six) hours as needed for nausea or vomiting. 30 tablet 0   carboxymethylcellulose (REFRESH PLUS) 0.5 % SOLN Place 1 drop into both eyes daily as needed (dry/irritated eyes).     hydrochlorothiazide (MICROZIDE) 12.5 MG capsule Take 1 capsule (12.5 mg total) by mouth daily. 90 capsule 3   HYDROcodone-acetaminophen (NORCO/VICODIN) 5-325 MG tablet Take 1 tablet by mouth every 6 (six) hours as needed for severe pain. 15 tablet 0  lisinopril (ZESTRIL) 10 MG tablet Take 1 tablet by mouth once daily 30 tablet 11   No current facility-administered medications for this visit.    SURGICAL HISTORY:  Past Surgical History:  Procedure Laterality Date   AORTIC VALVE REPLACEMENT N/A 10/14/2013   Procedure: AORTIC VALVE REPLACEMENT (AVR);  Surgeon: Alleen Borne, MD;  Location: [facility] OR;  Service: Open Heart Surgery;  Laterality: N/A;   CARDIAC CATHETERIZATION     CATARACT EXTRACTION, BILATERAL     CHOLECYSTECTOMY  04/08/1988   INTRAOPERATIVE TRANSESOPHAGEAL ECHOCARDIOGRAM  N/A 10/14/2013   Procedure: INTRAOPERATIVE TRANSESOPHAGEAL ECHOCARDIOGRAM;  Surgeon: Alleen Borne, MD;  Location: [facility] OR;  Service: Open Heart Surgery;  Laterality: N/A;   KYPHOPLASTY Bilateral 03/27/2022   Procedure: KYPHOPLASTY AND BIOPSY THORACIC ELEVEN;  Surgeon: Lisbeth Renshaw, MD;  Location: [facility] OR;  Service: Neurosurgery;  Laterality: Bilateral;   LEFT AND RIGHT HEART CATHETERIZATION WITH CORONARY ANGIOGRAM N/A 09/16/2013   Procedure: LEFT AND RIGHT HEART CATHETERIZATION WITH CORONARY ANGIOGRAM;  Surgeon: Quintella Reichert, MD;  Location: [facility] CATH LAB;  Service: Cardiovascular;  Laterality: N/A;   TONSILLECTOMY      REVIEW OF SYSTEMS:   Review of Systems  Constitutional: Negative for appetite change, chills, fatigue, fever and unexpected weight change.  HENT:   Negative for mouth sores, nosebleeds, sore throat and trouble swallowing.   Eyes: Negative for eye problems and icterus.  Respiratory: Negative for cough, hemoptysis, shortness of breath and wheezing.   Cardiovascular: Negative for chest pain and leg swelling.  Gastrointestinal: Negative for abdominal pain, constipation, diarrhea, nausea and vomiting.  Genitourinary: Negative for bladder incontinence, difficulty urinating, dysuria, frequency and hematuria.   Musculoskeletal: Negative for back pain, gait problem, neck pain and neck stiffness.  Skin: Negative for itching and rash.  Neurological: Negative for dizziness, extremity weakness, gait problem, headaches, light-headedness and seizures.  Hematological: Negative for adenopathy. Does not bruise/bleed easily.  Psychiatric/Behavioral: Negative for confusion, depression and sleep disturbance. The patient is not nervous/anxious.     PHYSICAL EXAMINATION:  There were no vitals taken for this visit.  ECOG PERFORMANCE STATUS: {CHL ONC ECOG [id number]  Physical Exam  Constitutional: Oriented to person, place, and time and well-developed, well-nourished, and in no distress.  No distress.  HENT:  Head: Normocephalic and atraumatic.  Mouth/Throat: Oropharynx is clear and moist. No oropharyngeal exudate.  Eyes: Conjunctivae are normal. Right eye exhibits no discharge. Left eye exhibits no discharge. No scleral icterus.  Neck: Normal range of motion. Neck supple.  Cardiovascular: Normal rate, regular rhythm, normal heart sounds and intact distal pulses.   Pulmonary/Chest: Effort normal and breath sounds normal. No respiratory distress. No wheezes. No rales.  Abdominal: Soft. Bowel sounds are normal. Exhibits no distension and no mass. There is no tenderness.  Musculoskeletal: Normal range of motion. Exhibits no edema.  Lymphadenopathy:    No cervical adenopathy.  Neurological: Alert and oriented to person, place, and time. Exhibits normal muscle tone. Gait normal. Coordination normal.  Skin: Skin is warm and dry. No rash noted. Not diaphoretic. No erythema. No pallor.  Psychiatric: Mood, memory and judgment normal.  Vitals reviewed.  LABORATORY DATA: Lab Results  Component Value Date   WBC 12.9 (H) 08/22/2022   HGB 8.3 (L) 08/22/2022   HCT 25.7 (L) 08/22/2022   MCV 93.5 08/22/2022   PLT 341 08/22/2022      Chemistry      Component Value Date/Time   NA 130 (L) 08/22/2022 1059   NA 141 10/06/2020 0939  K 3.0 (L) 08/22/2022 1059   CL 97 (L) 08/22/2022 1059   CO2 30 08/22/2022 1059   BUN 27 (H) 08/22/2022 1059   BUN 17 10/06/2020 0939   CREATININE 1.03 (H) 08/22/2022 1059   CREATININE 0.85 01/29/2016 0910      Component Value Date/Time   CALCIUM 9.8 08/22/2022 1059   ALKPHOS 153 (H) 08/22/2022 1059   AST 33 08/22/2022 1059   ALT 24 08/22/2022 1059   BILITOT 0.4 08/22/2022 1059       RADIOGRAPHIC STUDIES:  CT BONE MARROW BIOPSY & ASPIRATION  Result Date: 08/20/2022 INDICATION: Multiple myeloma EXAM: CT GUIDED BONE MARROW ASPIRATES AND BIOPSY MEDICATIONS: None. ANESTHESIA/SEDATION: Versed 0.5 mg IV Fentanyl 75 mcg IV Moderate Sedation Time:   10 minutes The patient was continuously monitored during the procedure by the interventional radiology nurse under my direct supervision. TECHNIQUE: Multidetector CT imaging of the pelvis was performed following the standard protocol without IV contrast. RADIATION DOSE REDUCTION: This exam was performed according to the departmental dose-optimization program which includes automated exposure control, adjustment of the mA and/or kV according to patient size and/or use of iterative reconstruction technique. COMPLICATIONS: None immediate. PROCEDURE: The procedure was explained to the patient. The risks and benefits of the procedure were discussed and the patient's questions were addressed. Informed consent was obtained from the patient. The patient was placed prone on CT table. Images of the pelvis were obtained. The right side of back was prepped and draped in sterile fashion. The skin and right posterior ilium were anesthetized with 1% lidocaine. 11 gauge bone needle was directed into the right ilium with CT guidance. Two aspirates and 1 core biopsy were obtained. Bandage placed over the puncture site. IMPRESSION: CT guided bone marrow aspiration and core biopsy. Electronically Signed   By: Acquanetta Belling M.D.   On: 08/20/2022 13:36   CT Angio Chest PE W and/or Wo Contrast  Result Date: 07/30/2022 CLINICAL DATA:  Right-sided abdominal pain, right-sided chest wall pain EXAM: CT ANGIOGRAPHY CHEST CT ABDOMEN AND PELVIS WITH CONTRAST TECHNIQUE: Multidetector CT imaging of the chest was performed using the standard protocol during bolus administration of intravenous contrast. Multiplanar CT image reconstructions and MIPs were obtained to evaluate the vascular anatomy. Multidetector CT imaging of the abdomen and pelvis was performed using the standard protocol during bolus administration of intravenous contrast. RADIATION DOSE REDUCTION: This exam was performed according to the departmental dose-optimization program  which includes automated exposure control, adjustment of the mA and/or kV according to patient size and/or use of iterative reconstruction technique. CONTRAST:  80mL OMNIPAQUE IOHEXOL 350 MG/ML SOLN COMPARISON:  07/26/2022, 11/28/2021, 11/16/2021 FINDINGS: CTA CHEST FINDINGS Cardiovascular: This is a technically adequate evaluation of the pulmonary vasculature. No filling defects or pulmonary emboli. Prominent left atrial dilatation. No pericardial effusion. Aortic valve prosthesis. Atherosclerosis of the coronary vasculature most pronounced in the LAD distribution. Ectasia of the ascending thoracic aorta measuring up to 3.9 cm in diameter. No evidence of dissection. Atherosclerosis most pronounced in the aortic arch and descending thoracic aorta. Mediastinum/Nodes: No enlarged mediastinal, hilar, or axillary lymph nodes. Thyroid gland, trachea, and esophagus demonstrate no significant findings. The soft tissue mediastinal mass seen adjacent to the pulmonary artery on prior exam has resolved or been resected in the interim. Lungs/Pleura: Mild bibasilar atelectasis, left greater than right. Mosaic areas of geographic ground-glass attenuation within the lungs consistent with small airway disease. No dense consolidation, effusion, or pneumothorax. The central airways are patent. Musculoskeletal: Mottled lucencies are seen  throughout the thoracic cage, most pronounced within the left eighth and ninth ribs posteriorly, new since prior study. Chronic appearing left fourth and right eighth rib fractures, also new since the previous exam is. There are no acute displaced fractures. Chronic T11 compression deformity with vertebral augmentation. Reconstructed images demonstrate no additional findings. Review of the MIP images confirms the above findings. CT ABDOMEN and PELVIS FINDINGS Hepatobiliary: No focal liver abnormality is seen. Status post cholecystectomy. No biliary dilatation. Pancreas: Unremarkable. No pancreatic  ductal dilatation or surrounding inflammatory changes. Spleen: Normal in size without focal abnormality. Adrenals/Urinary Tract: There are no acute renal findings. The adrenals are stable. Bladder is unremarkable. Stomach/Bowel: No bowel obstruction or ileus. Normal appendix right lower quadrant. No bowel wall thickening or inflammatory change. Vascular/Lymphatic: Atherosclerosis of the aorta and its branches. No pathologic adenopathy within the abdomen or pelvis. Reproductive: Stable simple appearing 3.3 x 4.8 cm left adnexal cyst. No right adnexal mass. The uterus is atrophic. Other: No free fluid or free intraperitoneal gas. No abdominal wall hernia. Musculoskeletal: There are no acute displaced fractures. 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 Reconstructed images demonstrate no additional findings. Review of the MIP images confirms the above findings. IMPRESSION: 1. Mottled punctate lucencies throughout the ribcage, new since prior study. Differential diagnosis would include metastatic disease versus multiple myeloma. Please correlate with laboratory analysis. 2. There are no acute displaced fractures. Chronic appearing healed bilateral rib fractures have developed in the interim since prior exam. 3. No evidence of pulmonary embolus. 4. Mosaic ground-glass attenuation within the lungs, most consistent with small airway disease. No dense airspace disease to suggest pneumonia. 5. Stable 4.8 cm simple appearing left adnexal cyst. A follow-up pelvic ultrasound was previously recommended based on PET scan findings. 6.  Aortic Atherosclerosis (ICD10-I70.0). Electronically Signed   By: Sharlet Salina M.D.   On: 07/30/2022 21:45   CT ABDOMEN PELVIS W CONTRAST  Result Date: 07/30/2022 CLINICAL DATA:  Right-sided abdominal pain, right-sided chest wall pain EXAM: CT ANGIOGRAPHY CHEST CT ABDOMEN AND PELVIS WITH CONTRAST TECHNIQUE: Multidetector CT imaging of the chest was performed using  the standard protocol during bolus administration of intravenous contrast. Multiplanar CT image reconstructions and MIPs were obtained to evaluate the vascular anatomy. Multidetector CT imaging of the abdomen and pelvis was performed using the standard protocol during bolus administration of intravenous contrast. RADIATION DOSE REDUCTION: This exam was performed according to the departmental dose-optimization program which includes automated exposure control, adjustment of the mA and/or kV according to patient size and/or use of iterative reconstruction technique. CONTRAST:  80mL OMNIPAQUE IOHEXOL 350 MG/ML SOLN COMPARISON:  07/26/2022, 11/28/2021, 11/16/2021 FINDINGS: CTA CHEST FINDINGS Cardiovascular: This is a technically adequate evaluation of the pulmonary vasculature. No filling defects or pulmonary emboli. Prominent left atrial dilatation. No pericardial effusion. Aortic valve prosthesis. Atherosclerosis of the coronary vasculature most pronounced in the LAD distribution. Ectasia of the ascending thoracic aorta measuring up to 3.9 cm in diameter. No evidence of dissection. Atherosclerosis most pronounced in the aortic arch and descending thoracic aorta. Mediastinum/Nodes: No enlarged mediastinal, hilar, or axillary lymph nodes. Thyroid gland, trachea, and esophagus demonstrate no significant findings. The soft tissue mediastinal mass seen adjacent to the pulmonary artery on prior exam has resolved or been resected in the interim. Lungs/Pleura: Mild bibasilar atelectasis, left greater than right. Mosaic areas of geographic ground-glass attenuation within the lungs consistent with small airway disease. No dense consolidation, effusion, or pneumothorax. The central airways are patent. Musculoskeletal: Mottled  lucencies are seen throughout the thoracic cage, most pronounced within the left eighth and ninth ribs posteriorly, new since prior study. Chronic appearing left fourth and right eighth rib fractures, also  new since the previous exam is. There are no acute displaced fractures. Chronic T11 compression deformity with vertebral augmentation. Reconstructed images demonstrate no additional findings. Review of the MIP images confirms the above findings. CT ABDOMEN and PELVIS FINDINGS Hepatobiliary: No focal liver abnormality is seen. Status post cholecystectomy. No biliary dilatation. Pancreas: Unremarkable. No pancreatic ductal dilatation or surrounding inflammatory changes. Spleen: Normal in size without focal abnormality. Adrenals/Urinary Tract: There are no acute renal findings. The adrenals are stable. Bladder is unremarkable. Stomach/Bowel: No bowel obstruction or ileus. Normal appendix right lower quadrant. No bowel wall thickening or inflammatory change. Vascular/Lymphatic: Atherosclerosis of the aorta and its branches. No pathologic adenopathy within the abdomen or pelvis. Reproductive: Stable simple appearing 3.3 x 4.8 cm left adnexal cyst. No right adnexal mass. The uterus is atrophic. Other: No free fluid or free intraperitoneal gas. No abdominal wall hernia. Musculoskeletal: There are no acute displaced fractures. 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 Reconstructed images demonstrate no additional findings. Review of the MIP images confirms the above findings. IMPRESSION: 1. Mottled punctate lucencies throughout the ribcage, new since prior study. Differential diagnosis would include metastatic disease versus multiple myeloma. Please correlate with laboratory analysis. 2. There are no acute displaced fractures. Chronic appearing healed bilateral rib fractures have developed in the interim since prior exam. 3. No evidence of pulmonary embolus. 4. Mosaic ground-glass attenuation within the lungs, most consistent with small airway disease. No dense airspace disease to suggest pneumonia. 5. Stable 4.8 cm simple appearing left adnexal cyst. A follow-up pelvic ultrasound was previously  recommended based on PET scan findings. 6.  Aortic Atherosclerosis (ICD10-I70.0). Electronically Signed   By: Sharlet Salina M.D.   On: 07/30/2022 21:45   DG Thoracic Spine W/Swimmers  Result Date: 07/30/2022 CLINICAL DATA:  Mid back pain, pain under right breast for 9 months EXAM: THORACIC SPINE - 3 VIEWS COMPARISON:  07/19/2022 FINDINGS: Frontal and lateral views of the thoracic spine are obtained on 3 images. Chronic T11 compression deformity and vertebral augmentation unchanged since prior exam. There are no acute bony abnormalities. Stable mild diffuse thoracolumbar spondylosis. Paraspinal soft tissues are unremarkable. Prior median sternotomy and aortic valve replacement. IMPRESSION: 1. Stable chronic T11 compression deformity and vertebral augmentation. 2. No acute bony abnormality. Electronically Signed   By: Sharlet Salina M.D.   On: 07/30/2022 17:21   DG Chest 2 View  Result Date: 07/30/2022 CLINICAL DATA:  Right-sided chest wall pain EXAM: CHEST - 2 VIEW COMPARISON:  06/18/2022 FINDINGS: Frontal and lateral views of the chest demonstrate stable postsurgical changes from median sternotomy and aortic valve replacement. The cardiac silhouette is stable. No acute airspace disease, effusion, or pneumothorax. Chronic wedge compression deformity and vertebral augmentation at the thoracolumbar junction. No acute bony abnormality. IMPRESSION: 1. No acute intrathoracic process. Electronically Signed   By: Sharlet Salina M.D.   On: 07/30/2022 17:20     ASSESSMENT/PLAN:  This is a very pleasant 82 years old white female with: 1) multiple myeloma which was diagnosed in May 2024 2) History of stage I (T1a, N0, M0) thymoma type AB diagnosed in October 2023 status post resection under the care of Dr. Dorris Fetch on January 28, 2022 with close resection margin and microscopic infiltration of the capsule   Imaging studies from April 2024 showed  lytic lesions.  The patient recently underwent a PET scan and a  bone marrow biopsy and aspirate.  Her PET scan showed ***  Bone marrow biopsy showed 35% plasma cells.  The patient was seen with Dr. Arbutus Ped.  Dr. Arbutus Ped had a lengthy discussion with the patient today about her current condition and recommended treatment options.  Dr. Arbutus Ped discussed that the patient has multiple myeloma.  Dr. Arbutus Ped recommended treatment with Velcade, Revlimid, and Decadron.  We will arrange for her to meet with the chemo pharmacist today regarding the Revlimid.  We will arrange for a chemo education class?  Aspirin, hep B?  Acyclovir?  Allopurinol?  She will meet with palliative care for management of her pain secondary to the fractures and lytic lesions.  Compazine?  We will see her back for follow-up visit in **  Palliative radiation?    No orders of the defined types were placed in this encounter.    I spent {CHL ONC TIME VISIT - ZOXWR:[id number]} counseling the patient face to face. The total time spent in the appointment was {CHL ONC TIME VISIT - BJYNW:[id number]}.  [name] Fite L Paizlee Kinder, PA-C 08/25/22

## 2022-08-27 ENCOUNTER — Inpatient Hospital Stay: Payer: Medicare PPO | Admitting: Physician Assistant

## 2022-08-27 ENCOUNTER — Telehealth: Payer: Self-pay | Admitting: Pharmacy Technician

## 2022-08-27 ENCOUNTER — Other Ambulatory Visit (HOSPITAL_COMMUNITY): Payer: Self-pay

## 2022-08-27 ENCOUNTER — Other Ambulatory Visit: Payer: Self-pay

## 2022-08-27 VITALS — BP 184/63 | HR 83 | Temp 97.9°F | Resp 18 | Wt 141.8 lb

## 2022-08-27 DIAGNOSIS — C9000 Multiple myeloma not having achieved remission: Secondary | ICD-10-CM | POA: Diagnosis not present

## 2022-08-27 DIAGNOSIS — G893 Neoplasm related pain (acute) (chronic): Secondary | ICD-10-CM | POA: Diagnosis not present

## 2022-08-27 DIAGNOSIS — Z79899 Other long term (current) drug therapy: Secondary | ICD-10-CM | POA: Diagnosis not present

## 2022-08-27 MED ORDER — DEXAMETHASONE 4 MG PO TABS
ORAL_TABLET | ORAL | 3 refills | Status: DC
Start: 2022-08-27 — End: 2022-09-08

## 2022-08-27 MED ORDER — ALLOPURINOL 100 MG PO TABS
100.0000 mg | ORAL_TABLET | Freq: Two times a day (BID) | ORAL | 2 refills | Status: DC
Start: 2022-08-27 — End: 2022-12-10

## 2022-08-27 NOTE — Telephone Encounter (Signed)
Oral Oncology Patient Advocate Encounter   Received notification that prior authorization for Lenalidomide is required.   PA submitted on 08/26/20 Key [id number] Status is pending     [name], CPhT-Adv Oncology Pharmacy Patient Advocate [facility] Direct Number: [phone]  Fax: [phone]

## 2022-08-27 NOTE — Telephone Encounter (Signed)
Oral Oncology Patient Advocate Encounter  Was successful in securing patient a $12,000 grant from [facility] to provide copayment coverage for lenalidomide.  This will keep the out of pocket expense at $0.     [employer] ID: [id number]   The billing information is as follows and has been shared with [facility].    RxBin: [id number] PCN: [id number] Member ID: [id number] Group ID: [id number] Dates of Eligibility: 07/28/22 through 07/27/23  Fund:  MM  [name], CPhT-Adv Oncology Pharmacy Patient Advocate [facility] Direct Number: [phone]  Fax: [phone]

## 2022-08-27 NOTE — Patient Instructions (Addendum)
The PET scan and bone marrow biopsy was consistent with a type of blood cancer called multiple myeloma. I have some information about this listed below -Therefore, treatment includes a pill you take at home revlimid (21 days in a row every 28 days-you can also think of this as 3 weeks on/one week off every 4 weeks), decadron (steroid) which you will take once a week (20 mg which is 5 tablets), and treatment given here in the infusion room (darzalex).  -We will arrange for a chemotherapy class prior to starting treatment. A oral chemotherapy pharmacist will call you to go over the education for the oral pill  -We will re-test the protein studies every 3rd cycle of treatment -We would like you to start next week.  -We need some baseline labs before you start. I will arrange a lab appointment before the chemo-education class.  -I will refer her to a nutritionist. You are scheduled to see a palliative care nurse practitioner next week to help with pain. We will also give you a medication monthly to help strengthen your bone, however, we need you to get permission from your dentist before we start this. Please have them fax that to [phone] attention to Dr. Asa Lente nurse -You have a prescription for Compazine which is a nausea medicine to take every 6 hours as needed for nausea and vomiting.  I also sent a prescription for allopurinol to take 1 tablet twice a day.  We also would like you to take 81 mg aspirin every day. -We will see you back for follow-up visit when you come in for your second week of treatment.

## 2022-08-28 ENCOUNTER — Other Ambulatory Visit (HOSPITAL_COMMUNITY): Payer: Self-pay

## 2022-08-28 ENCOUNTER — Telehealth: Payer: Self-pay | Admitting: Internal Medicine

## 2022-08-28 ENCOUNTER — Telehealth: Payer: Self-pay | Admitting: Pharmacist

## 2022-08-28 ENCOUNTER — Other Ambulatory Visit: Payer: Self-pay | Admitting: Medical Oncology

## 2022-08-28 ENCOUNTER — Other Ambulatory Visit: Payer: Self-pay | Admitting: Internal Medicine

## 2022-08-28 ENCOUNTER — Encounter: Payer: Self-pay | Admitting: Internal Medicine

## 2022-08-28 ENCOUNTER — Telehealth: Payer: Self-pay | Admitting: Medical Oncology

## 2022-08-28 DIAGNOSIS — C9000 Multiple myeloma not having achieved remission: Secondary | ICD-10-CM

## 2022-08-28 MED ORDER — LENALIDOMIDE 15 MG PO CAPS
15.0000 mg | ORAL_CAPSULE | Freq: Every day | ORAL | 0 refills | Status: DC
Start: 2022-08-28 — End: 2022-08-29

## 2022-08-28 NOTE — Telephone Encounter (Signed)
Scheduled per 05/22 scheduling message, patient has been called and voicemail was left.

## 2022-08-28 NOTE — Progress Notes (Signed)
START ON PATHWAY REGIMEN - Multiple Myeloma and Other Plasma Cell Dyscrasias     Cycles 1 and 2: A cycle is every 28 days:     Lenalidomide      Dexamethasone      Daratumumab and hyaluronidase-fihj    Cycles 3 through 6: A cycle is every 28 days:     Lenalidomide      Dexamethasone      Daratumumab and hyaluronidase-fihj    Cycles 7 and beyond: A cycle is every 28 days:     Lenalidomide      Dexamethasone      Daratumumab and hyaluronidase-fihj   **Always confirm dose/schedule in your pharmacy ordering system**  Patient Characteristics: Multiple Myeloma, Newly Diagnosed, Transplant Ineligible or Refused, Standard Risk Disease Classification: Multiple Myeloma Therapeutic Status: Newly Diagnosed R2-ISS Staging: II Is Patient Eligible for Transplant<= Transplant Ineligible or Refused Risk Status: Standard Risk Intent of Therapy: Non-Curative / Palliative Intent, Discussed with Patient

## 2022-08-28 NOTE — Telephone Encounter (Signed)
I enrolled pt in Revlimid REMS program and signed for her . I requested a call back to review questions/passwords on REMS form .   Her password question Robert Bellow are  What is your zip code ?- [phone] What is your date of birth? [birth year]  Order sent to [facility] for authorization.

## 2022-08-28 NOTE — Telephone Encounter (Signed)
Oral Oncology Patient Advocate Encounter  Prior Authorization for lenalidomide has been approved.    PA# [id number] Effective dates: 08/27/22 through 04/08/23  Patients co-pay is $100.    [name], CPhT-Adv Oncology Pharmacy Patient Advocate [facility] Direct Number: [phone]  Fax: [phone]

## 2022-08-28 NOTE — Telephone Encounter (Signed)
Oral Oncology Pharmacist Encounter  Received new prescription for Revlimid (lenalidomide) for the treatment of newly diagnosed multiple myeloma in conjunction with daratumumab and dexamethasone, planned duration until disease control or unacceptable drug toxicity. Planned start 09/05/22.  CMP from 08/22/22 assessed, SCr 1.03mg /dL and CrCl ~21HY/QMV. Patient's dose has been renally dose adjusted.   Current medication list in Epic reviewed, no DDIs with lenalidomide identified.  Evaluated chart and no patient barriers to medication adherence identified.   Prescription will be e-scribed to the [facility] for benefits analysis and approval.  Oral Oncology Clinic will continue to follow for insurance authorization, copayment issues, initial counseling and start date.   Remi Haggard, PharmD, BCPS, BCOP, CPP Hematology/Oncology Clinical Pharmacist Practitioner [place]/DB/AP Oral Chemotherapy Navigation Clinic [phone]  08/28/2022 4:46 PM

## 2022-08-28 NOTE — Progress Notes (Signed)
Pt enrolled in REMS program.

## 2022-08-29 ENCOUNTER — Other Ambulatory Visit (HOSPITAL_COMMUNITY): Payer: Self-pay | Admitting: Neurosurgery

## 2022-08-29 ENCOUNTER — Telehealth: Payer: Self-pay | Admitting: Internal Medicine

## 2022-08-29 DIAGNOSIS — M8448XD Pathological fracture, other site, subsequent encounter for fracture with routine healing: Secondary | ICD-10-CM

## 2022-08-29 MED ORDER — LENALIDOMIDE 15 MG PO CAPS
15.0000 mg | ORAL_CAPSULE | Freq: Every day | ORAL | 0 refills | Status: DC
Start: 2022-08-29 — End: 2022-09-30

## 2022-08-29 NOTE — Telephone Encounter (Signed)
Scheduled per 05/22 los, patient has been called and notified. 

## 2022-08-29 NOTE — Progress Notes (Signed)
Pharmacist Chemotherapy Monitoring - Initial Assessment    Anticipated start date: 09/06/22   The following has been reviewed per standard work regarding the patient's treatment regimen: The patient's diagnosis, treatment plan and drug doses, and organ/hematologic function Lab orders and baseline tests specific to treatment regimen  The treatment plan start date, drug sequencing, and pre-medications Prior authorization status  Patient's documented medication list, including drug-drug interaction screen and prescriptions for anti-emetics and supportive care specific to the treatment regimen The drug concentrations, fluid compatibility, administration routes, and timing of the medications to be used The patient's access for treatment and lifetime cumulative dose history, if applicable  The patient's medication allergies and previous infusion related reactions, if applicable   Changes made to treatment plan:  N/A  Follow up needed:  Pending authorization for treatment  F/u draw phenotype   [name], RPH, 08/29/2022  1:32 PM

## 2022-08-30 ENCOUNTER — Encounter (HOSPITAL_COMMUNITY): Payer: Self-pay | Admitting: Internal Medicine

## 2022-08-30 ENCOUNTER — Telehealth: Payer: Self-pay | Admitting: Pharmacist

## 2022-08-30 ENCOUNTER — Other Ambulatory Visit: Payer: Self-pay

## 2022-08-30 ENCOUNTER — Other Ambulatory Visit: Payer: Medicare PPO

## 2022-08-30 ENCOUNTER — Inpatient Hospital Stay (HOSPITAL_COMMUNITY)
Admission: EM | Admit: 2022-08-30 | Discharge: 2022-09-08 | DRG: 091 | Disposition: A | Discharge status: Skilled Nursing Facility | Payer: Medicare PPO | Attending: Internal Medicine | Admitting: Internal Medicine

## 2022-08-30 ENCOUNTER — Other Ambulatory Visit: Payer: Self-pay | Admitting: Medical Oncology

## 2022-08-30 ENCOUNTER — Encounter: Payer: Self-pay | Admitting: Medical Oncology

## 2022-08-30 ENCOUNTER — Encounter: Payer: Self-pay | Admitting: Internal Medicine

## 2022-08-30 ENCOUNTER — Telehealth: Payer: Self-pay | Admitting: Medical Oncology

## 2022-08-30 DIAGNOSIS — I272 Pulmonary hypertension, unspecified: Secondary | ICD-10-CM | POA: Diagnosis present

## 2022-08-30 DIAGNOSIS — R41841 Cognitive communication deficit: Secondary | ICD-10-CM | POA: Diagnosis not present

## 2022-08-30 DIAGNOSIS — I129 Hypertensive chronic kidney disease with stage 1 through stage 4 chronic kidney disease, or unspecified chronic kidney disease: Secondary | ICD-10-CM | POA: Diagnosis present

## 2022-08-30 DIAGNOSIS — Z91011 Allergy to milk products: Secondary | ICD-10-CM

## 2022-08-30 DIAGNOSIS — D649 Anemia, unspecified: Secondary | ICD-10-CM | POA: Diagnosis not present

## 2022-08-30 DIAGNOSIS — R509 Fever, unspecified: Secondary | ICD-10-CM | POA: Diagnosis not present

## 2022-08-30 DIAGNOSIS — I10 Essential (primary) hypertension: Secondary | ICD-10-CM | POA: Diagnosis not present

## 2022-08-30 DIAGNOSIS — Z881 Allergy status to other antibiotic agents status: Secondary | ICD-10-CM

## 2022-08-30 DIAGNOSIS — K2970 Gastritis, unspecified, without bleeding: Secondary | ICD-10-CM | POA: Diagnosis not present

## 2022-08-30 DIAGNOSIS — Z7189 Other specified counseling: Secondary | ICD-10-CM | POA: Diagnosis not present

## 2022-08-30 DIAGNOSIS — K3189 Other diseases of stomach and duodenum: Secondary | ICD-10-CM | POA: Diagnosis present

## 2022-08-30 DIAGNOSIS — G893 Neoplasm related pain (acute) (chronic): Secondary | ICD-10-CM | POA: Diagnosis present

## 2022-08-30 DIAGNOSIS — I82442 Acute embolism and thrombosis of left tibial vein: Secondary | ICD-10-CM | POA: Diagnosis present

## 2022-08-30 DIAGNOSIS — D62 Acute posthemorrhagic anemia: Secondary | ICD-10-CM | POA: Diagnosis not present

## 2022-08-30 DIAGNOSIS — K2211 Ulcer of esophagus with bleeding: Secondary | ICD-10-CM | POA: Diagnosis present

## 2022-08-30 DIAGNOSIS — E43 Unspecified severe protein-calorie malnutrition: Secondary | ICD-10-CM | POA: Diagnosis present

## 2022-08-30 DIAGNOSIS — R1311 Dysphagia, oral phase: Secondary | ICD-10-CM | POA: Diagnosis not present

## 2022-08-30 DIAGNOSIS — E7800 Pure hypercholesterolemia, unspecified: Secondary | ICD-10-CM | POA: Diagnosis present

## 2022-08-30 DIAGNOSIS — I2510 Atherosclerotic heart disease of native coronary artery without angina pectoris: Secondary | ICD-10-CM | POA: Diagnosis present

## 2022-08-30 DIAGNOSIS — G9340 Encephalopathy, unspecified: Secondary | ICD-10-CM | POA: Diagnosis present

## 2022-08-30 DIAGNOSIS — E876 Hypokalemia: Secondary | ICD-10-CM | POA: Diagnosis not present

## 2022-08-30 DIAGNOSIS — Z953 Presence of xenogenic heart valve: Secondary | ICD-10-CM | POA: Diagnosis not present

## 2022-08-30 DIAGNOSIS — C9000 Multiple myeloma not having achieved remission: Secondary | ICD-10-CM | POA: Diagnosis present

## 2022-08-30 DIAGNOSIS — M6281 Muscle weakness (generalized): Secondary | ICD-10-CM | POA: Diagnosis not present

## 2022-08-30 DIAGNOSIS — E860 Dehydration: Secondary | ICD-10-CM | POA: Diagnosis present

## 2022-08-30 DIAGNOSIS — Z79899 Other long term (current) drug therapy: Secondary | ICD-10-CM

## 2022-08-30 DIAGNOSIS — T40605A Adverse effect of unspecified narcotics, initial encounter: Secondary | ICD-10-CM | POA: Diagnosis present

## 2022-08-30 DIAGNOSIS — Z880 Allergy status to penicillin: Secondary | ICD-10-CM

## 2022-08-30 DIAGNOSIS — K208 Other esophagitis without bleeding: Secondary | ICD-10-CM | POA: Diagnosis not present

## 2022-08-30 DIAGNOSIS — Z66 Do not resuscitate: Secondary | ICD-10-CM | POA: Diagnosis not present

## 2022-08-30 DIAGNOSIS — N179 Acute kidney failure, unspecified: Secondary | ICD-10-CM

## 2022-08-30 DIAGNOSIS — K209 Esophagitis, unspecified without bleeding: Secondary | ICD-10-CM | POA: Diagnosis not present

## 2022-08-30 DIAGNOSIS — M8580 Other specified disorders of bone density and structure, unspecified site: Secondary | ICD-10-CM | POA: Diagnosis present

## 2022-08-30 DIAGNOSIS — N1831 Chronic kidney disease, stage 3a: Secondary | ICD-10-CM | POA: Diagnosis present

## 2022-08-30 DIAGNOSIS — Z6826 Body mass index (BMI) 26.0-26.9, adult: Secondary | ICD-10-CM

## 2022-08-30 DIAGNOSIS — I959 Hypotension, unspecified: Secondary | ICD-10-CM | POA: Diagnosis not present

## 2022-08-30 DIAGNOSIS — Z882 Allergy status to sulfonamides status: Secondary | ICD-10-CM

## 2022-08-30 DIAGNOSIS — E8809 Other disorders of plasma-protein metabolism, not elsewhere classified: Secondary | ICD-10-CM | POA: Diagnosis present

## 2022-08-30 DIAGNOSIS — G928 Other toxic encephalopathy: Secondary | ICD-10-CM | POA: Diagnosis not present

## 2022-08-30 DIAGNOSIS — R079 Chest pain, unspecified: Secondary | ICD-10-CM | POA: Diagnosis not present

## 2022-08-30 DIAGNOSIS — Z7982 Long term (current) use of aspirin: Secondary | ICD-10-CM

## 2022-08-30 DIAGNOSIS — M898X9 Other specified disorders of bone, unspecified site: Secondary | ICD-10-CM | POA: Diagnosis not present

## 2022-08-30 DIAGNOSIS — Z885 Allergy status to narcotic agent status: Secondary | ICD-10-CM

## 2022-08-30 DIAGNOSIS — E878 Other disorders of electrolyte and fluid balance, not elsewhere classified: Secondary | ICD-10-CM | POA: Diagnosis present

## 2022-08-30 DIAGNOSIS — R531 Weakness: Secondary | ICD-10-CM | POA: Diagnosis not present

## 2022-08-30 DIAGNOSIS — Z888 Allergy status to other drugs, medicaments and biological substances status: Secondary | ICD-10-CM

## 2022-08-30 DIAGNOSIS — L89156 Pressure-induced deep tissue damage of sacral region: Secondary | ICD-10-CM | POA: Diagnosis present

## 2022-08-30 DIAGNOSIS — M8448XA Pathological fracture, other site, initial encounter for fracture: Secondary | ICD-10-CM | POA: Diagnosis present

## 2022-08-30 DIAGNOSIS — Z952 Presence of prosthetic heart valve: Secondary | ICD-10-CM

## 2022-08-30 DIAGNOSIS — D630 Anemia in neoplastic disease: Secondary | ICD-10-CM | POA: Diagnosis present

## 2022-08-30 DIAGNOSIS — Z8249 Family history of ischemic heart disease and other diseases of the circulatory system: Secondary | ICD-10-CM

## 2022-08-30 DIAGNOSIS — R7989 Other specified abnormal findings of blood chemistry: Secondary | ICD-10-CM

## 2022-08-30 DIAGNOSIS — D6489 Other specified anemias: Secondary | ICD-10-CM | POA: Diagnosis present

## 2022-08-30 DIAGNOSIS — R54 Age-related physical debility: Secondary | ICD-10-CM | POA: Diagnosis present

## 2022-08-30 DIAGNOSIS — Z515 Encounter for palliative care: Secondary | ICD-10-CM | POA: Diagnosis not present

## 2022-08-30 DIAGNOSIS — E871 Hypo-osmolality and hyponatremia: Secondary | ICD-10-CM | POA: Diagnosis present

## 2022-08-30 DIAGNOSIS — R627 Adult failure to thrive: Secondary | ICD-10-CM | POA: Diagnosis present

## 2022-08-30 DIAGNOSIS — Z7401 Bed confinement status: Secondary | ICD-10-CM | POA: Diagnosis not present

## 2022-08-30 DIAGNOSIS — R2689 Other abnormalities of gait and mobility: Secondary | ICD-10-CM | POA: Diagnosis not present

## 2022-08-30 DIAGNOSIS — R0902 Hypoxemia: Secondary | ICD-10-CM | POA: Diagnosis not present

## 2022-08-30 DIAGNOSIS — R404 Transient alteration of awareness: Secondary | ICD-10-CM | POA: Diagnosis not present

## 2022-08-30 DIAGNOSIS — K921 Melena: Secondary | ICD-10-CM | POA: Diagnosis not present

## 2022-08-30 DIAGNOSIS — R4182 Altered mental status, unspecified: Secondary | ICD-10-CM | POA: Diagnosis present

## 2022-08-30 DIAGNOSIS — R400 Somnolence: Secondary | ICD-10-CM | POA: Diagnosis not present

## 2022-08-30 DIAGNOSIS — F419 Anxiety disorder, unspecified: Secondary | ICD-10-CM | POA: Diagnosis not present

## 2022-08-30 DIAGNOSIS — J9811 Atelectasis: Secondary | ICD-10-CM | POA: Diagnosis not present

## 2022-08-30 MED ORDER — IPRATROPIUM-ALBUTEROL 0.5-2.5 (3) MG/3ML IN SOLN: 3.0000 mL | Freq: Once | RESPIRATORY_TRACT | Status: DC

## 2022-08-30 NOTE — ED Triage Notes (Signed)
Pt from home by ems. Pt a and o x 4 with minor confusion at baseline. Today has stopped talking,eating, and has not urinated

## 2022-08-30 NOTE — ED Provider Notes (Incomplete)
[place] EMERGENCY DEPARTMENT AT [facility] Provider Note   CSN: [id number] Arrival date & time: 08/30/22  2308     History {Add pertinent medical, surgical, social history, OB history to HPI:1} Chief Complaint  Patient presents with  . Altered Mental Status    Pt has stopped speaking/eating/urination today    [name] is a 82 y.o. female.  The history is provided by the patient.  Altered Mental Status She has history of hypertension, hyperlipidemia, aortic stenosis injection and comes in complaining of pain in the left side of her chest radiating to the back.  She is unsure when it started.  There has been a cough productive of sputum which she spits out and does not look at the color.  She has had subjective fever as well as chills and sweats.  Her chest pain is not pleuritic.  EMS gave her aspirin and nitroglycerin with partial relief of pain.  She is a non-smoker.   Home Medications Prior to Admission medications   Medication Sig Start Date End Date Taking? Authorizing Provider  allopurinol (ZYLOPRIM) 100 MG tablet Take 1 tablet (100 mg total) by mouth 2 (two) times daily. 08/27/22   Heilingoetter, Cassandra L, PA-C  carboxymethylcellulose (REFRESH PLUS) 0.5 % SOLN Place 1 drop into both eyes daily as needed (dry/irritated eyes).    [provider]  dexamethasone (DECADRON) 4 MG tablet Please take 5 tablets (20 mg) weekly on the days of treatment 08/27/22   Heilingoetter, Cassandra L, PA-C  hydrochlorothiazide (MICROZIDE) 12.5 MG capsule Take 1 capsule (12.5 mg total) by mouth daily. 11/19/21   Quintella Reichert, MD  HYDROcodone-acetaminophen (NORCO/VICODIN) 5-325 MG tablet Take 1 tablet by mouth every 6 (six) hours as needed for severe pain. 07/30/22   Gwyneth Sprout, MD  lenalidomide (REVLIMID) 15 MG capsule Take 1 capsule (15 mg total) by mouth daily. Take for 21 days, then hold for 7 days. Repeat every 28 days. 08/29/22   Si Gaul, MD  lisinopril  (ZESTRIL) 10 MG tablet Take 1 tablet by mouth once daily 11/13/21   Quintella Reichert, MD  morphine (MS CONTIN) 15 MG 12 hr tablet Take 2 tablets (30 mg total) by mouth every 12 (twelve) hours. 08/21/22   Si Gaul, MD  prochlorperazine (COMPAZINE) 10 MG tablet Take 1 tablet (10 mg total) by mouth every 6 (six) hours as needed for nausea or vomiting. 08/21/22   Si Gaul, MD      Allergies    Crestor [rosuvastatin calcium], Lactose, Lipitor [atorvastatin], Pravastatin, Simvastatin, Tramadol, Zetia [ezetimibe], Codeine, Penicillins, Sulfa antibiotics, and Vancomycin    Review of Systems   Review of Systems  All other systems reviewed and are negative.   Physical Exam Updated Vital Signs BP (!) 156/60 (BP Location: Right Arm)   Pulse 94   Temp 99.8 F (37.7 C) (Oral)   Resp 17   Ht 5\' 1"  (1.549 m)   Wt 64.3 kg   SpO2 96%   BMI 26.78 kg/m  Physical Exam Vitals and nursing note reviewed.   82 year old female, resting comfortably and in no acute distress. Vital signs are significant for elevated blood pressure. Oxygen saturation is 96%, which is normal. Head is normocephalic and atraumatic. PERRLA, EOMI. Oropharynx is clear. Neck is nontender and supple without adenopathy or JVD. Back is moderately tender in the left paraspinal area in the lower thoracic region.  There is no crepitus.  There  is no CVA tenderness. Lungs have decreased  breath sounds with faint expiratory wheezes diffusely.  There are no rales or rhonchi. Chest is nontender. Heart has regular rate and rhythm without murmur. Abdomen is soft, flat, nontender. Extremities have no cyanosis or edema, full range of motion is present. Skin is warm and dry without rash. Neurologic: Mental status is normal, cranial nerves are intact, moves all extremities equally.  ED Results / Procedures / Treatments   Labs (all labs ordered are listed, but only abnormal results are displayed) Labs Reviewed  BASIC METABOLIC  PANEL  CBC WITH DIFFERENTIAL/PLATELET  D-DIMER, QUANTITATIVE  TROPONIN I (HIGH SENSITIVITY)    EKG None  Radiology No results found.  Procedures Procedures  Cardiac monitor shows normal sinus rhythm, per my interpretation.  Medications Ordered in ED Medications - No data to display  ED Course/ Medical Decision Making/ A&P   {   Click here for ABCD2, HEART and other calculatorsREFRESH Note before signing :1}                          Medical Decision Making Amount and/or Complexity of Data Reviewed Labs: ordered. Radiology: ordered.   ***  {Document critical care time when appropriate:1} {Document review of labs and clinical decision tools ie heart score, Chads2Vasc2 etc:1}  {Document your independent review of radiology images, and any outside records:1} {Document your discussion with family members, caretakers, and with consultants:1} {Document social determinants of health affecting pt's care:1} {Document your decision making why or why not admission, treatments were needed:1} Final Clinical Impression(s) / ED Diagnoses Final diagnoses:  None    Rx / DC Orders ED Discharge Orders     None

## 2022-08-30 NOTE — Telephone Encounter (Signed)
LVM for [name] to  return my call   Medication  side effects- Since Tuesday , when she started MSC 30 mg BID and Compazine every 12 hours ATC , ,Trinie has been  sleeping all the time and when she wakes up she barely opens her eyes. She is taking her other medications too and drinking fluids very well, but she is not eating  very much.   Her other son, Onalee Hua,   is staying with her now 24/7 and now has to walk with her to [place]  ,where before she was independent with a walker.   Yesterday he only gave her MSC 30 mg once due to her sleeping all the time.   Per Dr. Arbutus Ped , I instructed [name] to cut back MSC dose to 15 mg q 24 hours and may increase to 15 mg every 12 hours if needed for better pain control. He was advised to get pt to ED if she has worsening symptoms - unable to arouse her.pain increases, breathing slows down and /or  she falls. He voiced understanding.   Son requested palliative appt sooner.

## 2022-08-30 NOTE — ED Provider Notes (Signed)
[place] EMERGENCY DEPARTMENT AT [facility] Provider Note   CSN: [id number] Arrival date & time: 08/30/22  2308     History  Chief Complaint  Patient presents with   Altered Mental Status    Pt has stopped speaking/eating/urination today    [name] is a 82 y.o. female.  The history is provided by a relative. The history is limited by the condition of the patient (Altered mental status).  Altered Mental Status She has history of hypertension, hyperlipidemia, coronary artery disease, dilated ascending aorta, aortic stenosis and recently diagnosed multiple myeloma with rib fractures and is brought in for altered mental status.  Her son just started taking care of her 3 days ago and has noted that she has been declining since then.  Over the last 24 hours, she has stopped communicating.  She has not urinated since yesterday.  She had been given increasing doses of narcotics in order to control her pain.   Home Medications Prior to Admission medications   Medication Sig Start Date End Date Taking? Authorizing Provider  allopurinol (ZYLOPRIM) 100 MG tablet Take 1 tablet (100 mg total) by mouth 2 (two) times daily. 08/27/22   Heilingoetter, Cassandra L, PA-C  carboxymethylcellulose (REFRESH PLUS) 0.5 % SOLN Place 1 drop into both eyes daily as needed (dry/irritated eyes).    [provider]  dexamethasone (DECADRON) 4 MG tablet Please take 5 tablets (20 mg) weekly on the days of treatment 08/27/22   Heilingoetter, Cassandra L, PA-C  hydrochlorothiazide (MICROZIDE) 12.5 MG capsule Take 1 capsule (12.5 mg total) by mouth daily. 11/19/21   Quintella Reichert, MD  HYDROcodone-acetaminophen (NORCO/VICODIN) 5-325 MG tablet Take 1 tablet by mouth every 6 (six) hours as needed for severe pain. 07/30/22   Gwyneth Sprout, MD  lenalidomide (REVLIMID) 15 MG capsule Take 1 capsule (15 mg total) by mouth daily. Take for 21 days, then hold for 7 days. Repeat every 28 days. 08/29/22    Si Gaul, MD  lisinopril (ZESTRIL) 10 MG tablet Take 1 tablet by mouth once daily 11/13/21   Quintella Reichert, MD  morphine (MS CONTIN) 15 MG 12 hr tablet Take 2 tablets (30 mg total) by mouth every 12 (twelve) hours. 08/21/22   Si Gaul, MD  prochlorperazine (COMPAZINE) 10 MG tablet Take 1 tablet (10 mg total) by mouth every 6 (six) hours as needed for nausea or vomiting. 08/21/22   Si Gaul, MD      Allergies    Crestor [rosuvastatin calcium], Lactose, Lipitor [atorvastatin], Pravastatin, Simvastatin, Tramadol, Zetia [ezetimibe], Codeine, Penicillins, Sulfa antibiotics, and Vancomycin    Review of Systems   Review of Systems  Unable to perform ROS: Mental status change    Physical Exam Updated Vital Signs BP (!) 156/60 (BP Location: Right Arm)   Pulse 94   Temp 99.8 F (37.7 C) (Oral)   Resp 17   Ht 5\' 1"  (1.549 m)   Wt 64.3 kg   SpO2 96%   BMI 26.78 kg/m  Physical Exam Vitals and nursing note reviewed.   82 year old female, resting comfortably and in no acute distress. Vital signs are significant for elevated blood pressure. Oxygen saturation is 96%, which is normal. Head is normocephalic and atraumatic. PERRLA, EOMI. Oropharynx is clear.  Mucous membranes are slightly dry. Neck is nontender and supple without adenopathy or JVD. Back is nontender.  There  is no CVA tenderness. Lungs have decreased breath sounds with faint expiratory wheezes diffusely.  There are  no rales or rhonchi. Chest is nontender. Heart has regular rate and rhythm without murmur. Abdomen is soft, flat, nontender. Extremities have no cyanosis or edema, full range of motion is present. Skin is warm and dry without rash. Neurologic: Sleepy but arousable, oriented to person and place but not time.  Cranial nerves are intact, moves all extremities equally.  ED Results / Procedures / Treatments   Labs (all labs ordered are listed, but only abnormal results are displayed) Labs Reviewed   BASIC METABOLIC PANEL  CBC WITH DIFFERENTIAL/PLATELET  D-DIMER, QUANTITATIVE  TROPONIN I (HIGH SENSITIVITY)    EKG None  Radiology No results found.  Procedures Procedures   Medications Ordered in ED Medications - No data to display  ED Course/ Medical Decision Making/ A&P                             Medical Decision Making Amount and/or Complexity of Data Reviewed Labs: ordered. Radiology: ordered.  Risk Decision regarding hospitalization.   Altered mental status and patient with recently diagnosed multiple myeloma.  Differential diagnosis includes, but is not limited to, hypercalcemia secondary to multiple myeloma, opioid overdose, occult infection such as urinary tract infection or pneumonia.  I have ordered laboratory workup of CBC, basic metabolic panel, hepatic function panel, urinalysis.  I have ordered chest x-ray and CT of head.  I have reviewed her past records, and a note on office visit from 08/27/2022 that she is scheduled to start chemotherapy on 09/05/2022, was given a blood transfusion on 08/07/2022.  Chest x-ray shows increased interstitial lung markings, atelectasis but no evidence of pneumonia.  CT of head shows no acute intracranial abnormality but numerous lytic lesions scattered throughout the skull consistent with multiple myeloma.  I have independently viewed all of these images, and agree with radiologist's interpretation.  I have reviewed and interpreted her laboratory tests and my interpretation is mild leukocytosis and moderate anemia which are unchanged from baseline, evidence of acute kidney injury with creatinine 2.73 and BUN 58 increased from creatinine 1.03 and BUN of 27 on 08/22/2022, mild hyponatremia which is not significantly changed from prior and likely secondary to paraproteinemia, mildly elevated troponin which is essentially stable and felt to represent demand ischemia.  Calcium is minimally elevated at 10.5, not sufficiently elevated to account  for her mental status change.  I do note that her paraprotein level as likely increased compared with 08/22/2022 and I wonder if she has a component of hyperviscosity accounting for her mental status change.  I have discussed case with Dr. Allena Katz of [facility], who agrees to admit the patient.  Final Clinical Impression(s) / ED Diagnoses Final diagnoses:  Altered mental status, unspecified altered mental status type  Acute kidney injury (nontraumatic) (HCC)  Normochromic normocytic anemia  Hypercalcemia  Elevated troponin    Rx / DC Orders ED Discharge Orders     None         Dione Booze, MD 08/31/22 0410

## 2022-08-30 NOTE — Telephone Encounter (Signed)
I instructed son to ask for Narcan from pt pharmacy and to use if pt not able to arouse and respirations , 10-11/minute and suspected overdose. I emailed information about Narcan.  Naloxone Nasal Spray What is this medication? NALOXONE (nal OX one) treats opioid overdose, which causes slow or shallow breathing, severe drowsiness, or trouble staying awake. Call emergency services after using this medication. You may need additional treatment. Naloxone works by reversing the effects of opioids. It belongs to a group of medications called opioid blockers. This medicine may be used for other purposes; ask your health care provider or pharmacist if you have questions. COMMON BRAND NAME(S): Kloxxado, Narcan What should I tell my care team before I take this medication? They need to know if you have any of these conditions: Heart disease Substance use disorder An unusual or allergic reaction to naloxone, other medications, foods, dyes, or preservatives Pregnant or trying to get pregnant Breast-feeding How should I use this medication? This medication is for use in the nose. Lay the person on their back. Support their neck with your hand and allow the head to tilt back before giving the medication. The nasal spray should be given into 1 nostril. After giving the medication, move the person onto their side. Do not remove or test the nasal spray until ready to use. Get emergency medical help right away after giving the first dose of this medication, even if the person wakes up. You should be familiar with how to recognize the signs and symptoms of a narcotic overdose. If more doses are needed, give the additional dose in the other nostril. Talk to your care team about the use of this medication in children. While this medication may be prescribed for children as young as newborns for selected conditions, precautions do apply. Overdosage: If you think you have taken too much of this medicine contact a poison  control center or emergency room at once. NOTE: This medicine is only for you. Do not share this medicine with others. What if I miss a dose? This does not apply. What may interact with this medication? This is only used during an emergency. No interactions are expected during emergency use. This list may not describe all possible interactions. Give your health care provider a list of all the medicines, herbs, non-prescription drugs, or dietary supplements you use. Also tell them if you smoke, drink alcohol, or use illegal drugs. Some items may interact with your medicine. What should I watch for while using this medication? Keep this medication ready for use in the case of an opioid overdose. Make sure that you have the phone number of your care team and local hospital ready. You may need to have additional doses of this medication. Each nasal spray contains a single dose. Some emergencies may require additional doses. After use, bring the treated person to the nearest hospital or call 911. Make sure the treating care team knows that the person has received a dose of this medication. You will receive additional instructions on what to do during and after use of this medication before an emergency occurs. What side effects may I notice from receiving this medication? Side effects that you should report to your care team as soon as possible: Allergic reactions--skin rash, itching, hives, swelling of the face, lips, tongue, or throat Side effects that usually do not require medical attention (report these to your care team if they continue or are bothersome): Constipation Dryness or irritation inside the nose Headache Increase in  blood pressure Muscle spasms Stuffy nose Toothache This list may not describe all possible side effects. Call your doctor for medical advice about side effects. You may report side effects to FDA at 1-800-FDA-1088. Where should I keep my medication? Keep out of the  reach of children and pets. Store between 20 and 25 degrees C (68 and 77 degrees F). Do not freeze. Throw away any unused medication after the expiration date. Keep in original box until ready to use. NOTE: This sheet is a summary. It may not cover all possible information. If you have questions about this medicine, talk to your doctor, pharmacist, or health care provider.  2024 Elsevier/Gold Standard (2021-02-19 00:00:00)

## 2022-08-31 ENCOUNTER — Emergency Department (HOSPITAL_COMMUNITY): Payer: Medicare PPO

## 2022-08-31 ENCOUNTER — Observation Stay (HOSPITAL_COMMUNITY): Payer: Medicare PPO

## 2022-08-31 ENCOUNTER — Inpatient Hospital Stay (HOSPITAL_COMMUNITY): Payer: Medicare PPO

## 2022-08-31 DIAGNOSIS — E876 Hypokalemia: Secondary | ICD-10-CM | POA: Diagnosis not present

## 2022-08-31 DIAGNOSIS — D630 Anemia in neoplastic disease: Secondary | ICD-10-CM | POA: Diagnosis present

## 2022-08-31 DIAGNOSIS — K2211 Ulcer of esophagus with bleeding: Secondary | ICD-10-CM | POA: Diagnosis present

## 2022-08-31 DIAGNOSIS — Z7189 Other specified counseling: Secondary | ICD-10-CM

## 2022-08-31 DIAGNOSIS — R7989 Other specified abnormal findings of blood chemistry: Secondary | ICD-10-CM

## 2022-08-31 DIAGNOSIS — E860 Dehydration: Secondary | ICD-10-CM | POA: Diagnosis present

## 2022-08-31 DIAGNOSIS — E43 Unspecified severe protein-calorie malnutrition: Secondary | ICD-10-CM | POA: Diagnosis present

## 2022-08-31 DIAGNOSIS — M8448XA Pathological fracture, other site, initial encounter for fracture: Secondary | ICD-10-CM | POA: Diagnosis present

## 2022-08-31 DIAGNOSIS — Z952 Presence of prosthetic heart valve: Secondary | ICD-10-CM | POA: Diagnosis not present

## 2022-08-31 DIAGNOSIS — E871 Hypo-osmolality and hyponatremia: Secondary | ICD-10-CM | POA: Diagnosis present

## 2022-08-31 DIAGNOSIS — E878 Other disorders of electrolyte and fluid balance, not elsewhere classified: Secondary | ICD-10-CM | POA: Diagnosis not present

## 2022-08-31 DIAGNOSIS — I82442 Acute embolism and thrombosis of left tibial vein: Secondary | ICD-10-CM | POA: Diagnosis present

## 2022-08-31 DIAGNOSIS — G929 Unspecified toxic encephalopathy: Secondary | ICD-10-CM

## 2022-08-31 DIAGNOSIS — N179 Acute kidney failure, unspecified: Secondary | ICD-10-CM | POA: Diagnosis not present

## 2022-08-31 DIAGNOSIS — D62 Acute posthemorrhagic anemia: Secondary | ICD-10-CM | POA: Diagnosis not present

## 2022-08-31 DIAGNOSIS — G9340 Encephalopathy, unspecified: Secondary | ICD-10-CM | POA: Diagnosis not present

## 2022-08-31 DIAGNOSIS — E8809 Other disorders of plasma-protein metabolism, not elsewhere classified: Secondary | ICD-10-CM | POA: Diagnosis present

## 2022-08-31 DIAGNOSIS — G893 Neoplasm related pain (acute) (chronic): Secondary | ICD-10-CM | POA: Diagnosis not present

## 2022-08-31 DIAGNOSIS — G9341 Metabolic encephalopathy: Secondary | ICD-10-CM

## 2022-08-31 DIAGNOSIS — N1831 Chronic kidney disease, stage 3a: Secondary | ICD-10-CM

## 2022-08-31 DIAGNOSIS — R400 Somnolence: Secondary | ICD-10-CM | POA: Diagnosis not present

## 2022-08-31 DIAGNOSIS — C9000 Multiple myeloma not having achieved remission: Secondary | ICD-10-CM | POA: Diagnosis not present

## 2022-08-31 DIAGNOSIS — I272 Pulmonary hypertension, unspecified: Secondary | ICD-10-CM | POA: Diagnosis present

## 2022-08-31 DIAGNOSIS — I2510 Atherosclerotic heart disease of native coronary artery without angina pectoris: Secondary | ICD-10-CM | POA: Diagnosis not present

## 2022-08-31 DIAGNOSIS — I129 Hypertensive chronic kidney disease with stage 1 through stage 4 chronic kidney disease, or unspecified chronic kidney disease: Secondary | ICD-10-CM | POA: Diagnosis present

## 2022-08-31 DIAGNOSIS — D649 Anemia, unspecified: Secondary | ICD-10-CM | POA: Diagnosis not present

## 2022-08-31 DIAGNOSIS — G928 Other toxic encephalopathy: Secondary | ICD-10-CM | POA: Diagnosis present

## 2022-08-31 DIAGNOSIS — Z881 Allergy status to other antibiotic agents status: Secondary | ICD-10-CM | POA: Diagnosis not present

## 2022-08-31 DIAGNOSIS — R4182 Altered mental status, unspecified: Secondary | ICD-10-CM | POA: Diagnosis present

## 2022-08-31 DIAGNOSIS — D631 Anemia in chronic kidney disease: Secondary | ICD-10-CM

## 2022-08-31 DIAGNOSIS — K208 Other esophagitis without bleeding: Secondary | ICD-10-CM | POA: Diagnosis not present

## 2022-08-31 DIAGNOSIS — M898X9 Other specified disorders of bone, unspecified site: Secondary | ICD-10-CM | POA: Diagnosis not present

## 2022-08-31 DIAGNOSIS — R404 Transient alteration of awareness: Secondary | ICD-10-CM | POA: Diagnosis not present

## 2022-08-31 DIAGNOSIS — R531 Weakness: Secondary | ICD-10-CM | POA: Diagnosis not present

## 2022-08-31 DIAGNOSIS — Z66 Do not resuscitate: Secondary | ICD-10-CM | POA: Diagnosis not present

## 2022-08-31 DIAGNOSIS — K3189 Other diseases of stomach and duodenum: Secondary | ICD-10-CM | POA: Diagnosis not present

## 2022-08-31 DIAGNOSIS — E7800 Pure hypercholesterolemia, unspecified: Secondary | ICD-10-CM | POA: Diagnosis present

## 2022-08-31 DIAGNOSIS — I10 Essential (primary) hypertension: Secondary | ICD-10-CM

## 2022-08-31 DIAGNOSIS — Z515 Encounter for palliative care: Secondary | ICD-10-CM | POA: Diagnosis not present

## 2022-08-31 DIAGNOSIS — L89156 Pressure-induced deep tissue damage of sacral region: Secondary | ICD-10-CM | POA: Diagnosis present

## 2022-08-31 DIAGNOSIS — K2970 Gastritis, unspecified, without bleeding: Secondary | ICD-10-CM | POA: Diagnosis not present

## 2022-08-31 DIAGNOSIS — Z953 Presence of xenogenic heart valve: Secondary | ICD-10-CM | POA: Diagnosis not present

## 2022-08-31 LAB — COMPREHENSIVE METABOLIC PANEL
ALT: 35 U/L (ref 0–44)
AST: 73 U/L — ABNORMAL HIGH (ref 15–41)
Albumin: <1.5 g/dL — ABNORMAL LOW (ref 3.5–5.0)
Alkaline Phosphatase: 114 U/L (ref 38–126)
Anion gap: 11 (ref 5–15)
BUN: 57 mg/dL — ABNORMAL HIGH (ref 8–23)
CO2: 22 mmol/L (ref 22–32)
Calcium: 9.5 mg/dL (ref 8.9–10.3)
Chloride: 95 mmol/L — ABNORMAL LOW (ref 98–111)
Creatinine, Ser: 2.37 mg/dL — ABNORMAL HIGH (ref 0.44–1.00)
GFR, Estimated: 20 mL/min — ABNORMAL LOW (ref >60)
Glucose, Bld: 106 mg/dL — ABNORMAL HIGH (ref 70–99)
Potassium: 3.6 mmol/L (ref 3.5–5.1)
Sodium: 128 mmol/L — ABNORMAL LOW (ref 135–145)
Total Bilirubin: 1.4 mg/dL — ABNORMAL HIGH (ref 0.3–1.2)
Total Protein: 10.3 g/dL — ABNORMAL HIGH (ref 6.5–8.1)

## 2022-08-31 LAB — CBC WITH DIFFERENTIAL/PLATELET
Abs Immature Granulocytes: 0.11 10*3/uL — ABNORMAL HIGH (ref 0.00–0.07)
Abs Immature Granulocytes: 0.18 10*3/uL — ABNORMAL HIGH (ref 0.00–0.07)
Basophils Absolute: 0 10*3/uL (ref 0.0–0.1)
Basophils Absolute: 0 10*3/uL (ref 0.0–0.1)
Basophils Relative: 0 %
Basophils Relative: 0 %
Eosinophils Absolute: 0 10*3/uL (ref 0.0–0.5)
Eosinophils Absolute: 0 10*3/uL (ref 0.0–0.5)
Eosinophils Relative: 0 %
Eosinophils Relative: 0 %
HCT: 24.2 % — ABNORMAL LOW (ref 36.0–46.0)
HCT: 25.9 % — ABNORMAL LOW (ref 36.0–46.0)
Hemoglobin: 7.5 g/dL — ABNORMAL LOW (ref 12.0–15.0)
Hemoglobin: 8.2 g/dL — ABNORMAL LOW (ref 12.0–15.0)
Immature Granulocytes: 1 %
Immature Granulocytes: 1 %
Lymphocytes Relative: 11 %
Lymphocytes Relative: 12 %
Lymphs Abs: 1.5 10*3/uL (ref 0.7–4.0)
Lymphs Abs: 2.2 10*3/uL (ref 0.7–4.0)
MCH: 29.3 pg (ref 26.0–34.0)
MCH: 30.1 pg (ref 26.0–34.0)
MCHC: 31 g/dL (ref 30.0–36.0)
MCHC: 31.7 g/dL (ref 30.0–36.0)
MCV: 94.5 fL (ref 80.0–100.0)
MCV: 95.2 fL (ref 80.0–100.0)
Monocytes Absolute: 0.8 10*3/uL (ref 0.1–1.0)
Monocytes Absolute: 1.9 10*3/uL — ABNORMAL HIGH (ref 0.1–1.0)
Monocytes Relative: 11 %
Monocytes Relative: 6 %
Neutro Abs: 11 10*3/uL — ABNORMAL HIGH (ref 1.7–7.7)
Neutro Abs: 13.5 10*3/uL — ABNORMAL HIGH (ref 1.7–7.7)
Neutrophils Relative %: 76 %
Neutrophils Relative %: 82 %
Platelets: 299 10*3/uL (ref 150–400)
Platelets: 315 10*3/uL (ref 150–400)
RBC: 2.56 MIL/uL — ABNORMAL LOW (ref 3.87–5.11)
RBC: 2.72 MIL/uL — ABNORMAL LOW (ref 3.87–5.11)
RDW: 15.8 % — ABNORMAL HIGH (ref 11.5–15.5)
RDW: 15.9 % — ABNORMAL HIGH (ref 11.5–15.5)
WBC: 13.5 10*3/uL — ABNORMAL HIGH (ref 4.0–10.5)
WBC: 17.8 10*3/uL — ABNORMAL HIGH (ref 4.0–10.5)
nRBC: 0 % (ref 0.0–0.2)
nRBC: 0 % (ref 0.0–0.2)

## 2022-08-31 LAB — TROPONIN I (HIGH SENSITIVITY)
Troponin I (High Sensitivity): 46 ng/L — ABNORMAL HIGH (ref <18)
Troponin I (High Sensitivity): 51 ng/L — ABNORMAL HIGH (ref <18)
Troponin I (High Sensitivity): 55 ng/L — ABNORMAL HIGH (ref <18)
Troponin I (High Sensitivity): 78 ng/L — ABNORMAL HIGH (ref <18)

## 2022-08-31 LAB — URINALYSIS, W/ REFLEX TO CULTURE (INFECTION SUSPECTED)
Bilirubin Urine: NEGATIVE
Glucose, UA: NEGATIVE mg/dL
Ketones, ur: 5 mg/dL — AB
Leukocytes,Ua: NEGATIVE
Nitrite: NEGATIVE
Protein, ur: NEGATIVE mg/dL
Specific Gravity, Urine: 1.013 (ref 1.005–1.030)
pH: 5 (ref 5.0–8.0)

## 2022-08-31 LAB — RENAL FUNCTION PANEL
Albumin: <1.5 g/dL — ABNORMAL LOW (ref 3.5–5.0)
Anion gap: 5 (ref 5–15)
BUN: 55 mg/dL — ABNORMAL HIGH (ref 8–23)
CO2: 27 mmol/L (ref 22–32)
Calcium: 9.5 mg/dL (ref 8.9–10.3)
Chloride: 98 mmol/L (ref 98–111)
Creatinine, Ser: 2.08 mg/dL — ABNORMAL HIGH (ref 0.44–1.00)
GFR, Estimated: 23 mL/min — ABNORMAL LOW (ref >60)
Glucose, Bld: 115 mg/dL — ABNORMAL HIGH (ref 70–99)
Phosphorus: >30 mg/dL — ABNORMAL HIGH (ref 2.5–4.6)
Potassium: 3.2 mmol/L — ABNORMAL LOW (ref 3.5–5.1)
Sodium: 130 mmol/L — ABNORMAL LOW (ref 135–145)

## 2022-08-31 LAB — HEPATIC FUNCTION PANEL
ALT: 31 U/L (ref 0–44)
AST: 55 U/L — ABNORMAL HIGH (ref 15–41)
Albumin: <1.5 g/dL — ABNORMAL LOW (ref 3.5–5.0)
Alkaline Phosphatase: 123 U/L (ref 38–126)
Bilirubin, Direct: 0.1 mg/dL (ref 0.0–0.2)
Indirect Bilirubin: 0.8 mg/dL (ref 0.3–0.9)
Total Bilirubin: 0.9 mg/dL (ref 0.3–1.2)
Total Protein: 11 g/dL — ABNORMAL HIGH (ref 6.5–8.1)

## 2022-08-31 LAB — AMMONIA: Ammonia: 20 umol/L (ref 9–35)

## 2022-08-31 LAB — BASIC METABOLIC PANEL
Anion gap: 9 (ref 5–15)
BUN: 58 mg/dL — ABNORMAL HIGH (ref 8–23)
CO2: 29 mmol/L (ref 22–32)
Calcium: 10.5 mg/dL — ABNORMAL HIGH (ref 8.9–10.3)
Chloride: 89 mmol/L — ABNORMAL LOW (ref 98–111)
Creatinine, Ser: 2.73 mg/dL — ABNORMAL HIGH (ref 0.44–1.00)
GFR, Estimated: 17 mL/min — ABNORMAL LOW (ref >60)
Glucose, Bld: 89 mg/dL (ref 70–99)
Potassium: 3.5 mmol/L (ref 3.5–5.1)
Sodium: 127 mmol/L — ABNORMAL LOW (ref 135–145)

## 2022-08-31 LAB — MAGNESIUM: Magnesium: 1.8 mg/dL (ref 1.7–2.4)

## 2022-08-31 LAB — D-DIMER, QUANTITATIVE: D-Dimer, Quant: 3.73 ug/mL-FEU — ABNORMAL HIGH (ref 0.00–0.50)

## 2022-08-31 LAB — VITAMIN B12: Vitamin B-12: 840 pg/mL (ref 180–914)

## 2022-08-31 LAB — TSH: TSH: 0.767 u[IU]/mL (ref 0.350–4.500)

## 2022-08-31 LAB — TYPE AND SCREEN: ABO/RH(D): O POS

## 2022-08-31 LAB — PHOSPHORUS: Phosphorus: >30 mg/dL — ABNORMAL HIGH (ref 2.5–4.6)

## 2022-08-31 LAB — T4, FREE: Free T4: 1.09 ng/dL (ref 0.61–1.12)

## 2022-08-31 MED ORDER — ACETAMINOPHEN 325 MG PO TABS: 650.0000 mg | ORAL_TABLET | Freq: Four times a day (QID) | ORAL | Status: DC | PRN

## 2022-08-31 MED ORDER — SEVELAMER CARBONATE 2.4 G PO PACK
2.4000 g | PACK | Freq: Three times a day (TID) | ORAL | Status: DC
  Administered 2022-09-01 – 2022-09-08 (×12): 2.4 g via ORAL
  Filled 2022-08-31 (×26): qty 1

## 2022-08-31 MED ORDER — HEPARIN SODIUM (PORCINE) 5000 UNIT/ML IJ SOLN
5000.0000 [IU] | Freq: Three times a day (TID) | INTRAMUSCULAR | Status: DC
  Administered 2022-08-31: 5000 [IU] via SUBCUTANEOUS
  Filled 2022-08-31: qty 1

## 2022-08-31 MED ORDER — SODIUM CHLORIDE 0.9 % IV BOLUS
1000.0000 mL | Freq: Once | INTRAVENOUS | Status: AC
  Administered 2022-08-31: 1000 mL via INTRAVENOUS

## 2022-08-31 MED ORDER — SODIUM CHLORIDE 0.9 % IV SOLN: INTRAVENOUS | Status: DC

## 2022-08-31 MED ORDER — NALOXONE HCL 0.4 MG/ML IJ SOLN
0.1000 mg | INTRAMUSCULAR | Status: DC | PRN
  Administered 2022-08-31: 0.1 mg via INTRAVENOUS
  Filled 2022-08-31: qty 1

## 2022-08-31 MED ORDER — POTASSIUM CHLORIDE 20 MEQ PO PACK
40.0000 meq | PACK | ORAL | Status: DC
  Administered 2022-08-31: 40 meq via ORAL

## 2022-08-31 MED ORDER — SODIUM CHLORIDE 0.9% FLUSH
3.0000 mL | Freq: Two times a day (BID) | INTRAVENOUS | Status: DC
  Administered 2022-08-31 – 2022-09-06 (×9): 3 mL via INTRAVENOUS

## 2022-08-31 MED ORDER — HEPARIN BOLUS VIA INFUSION
2500.0000 [IU] | Freq: Once | INTRAVENOUS | Status: AC
  Administered 2022-08-31: 2500 [IU] via INTRAVENOUS
  Filled 2022-08-31: qty 2500

## 2022-08-31 MED ORDER — MORPHINE SULFATE (PF) 2 MG/ML IV SOLN: 1.0000 mg | INTRAVENOUS | Status: DC | PRN

## 2022-08-31 MED ORDER — ENOXAPARIN SODIUM 80 MG/0.8ML IJ SOSY: 70.0000 mg | PREFILLED_SYRINGE | INTRAMUSCULAR | Status: DC

## 2022-08-31 MED ORDER — NALOXONE HCL 0.4 MG/ML IJ SOLN: 0.4000 mg | INTRAMUSCULAR | Status: DC | PRN

## 2022-08-31 MED ORDER — HEPARIN (PORCINE) 25000 UT/250ML-% IV SOLN
1200.0000 [IU]/h | INTRAVENOUS | Status: DC
  Administered 2022-08-31: 1000 [IU]/h via INTRAVENOUS
  Administered 2022-09-01 – 2022-09-02 (×2): 1200 [IU]/h via INTRAVENOUS
  Filled 2022-08-31 (×3): qty 250

## 2022-08-31 MED ORDER — HYDRALAZINE HCL 20 MG/ML IJ SOLN: 5.0000 mg | INTRAMUSCULAR | Status: DC | PRN

## 2022-08-31 MED ORDER — NALOXONE HCL 0.4 MG/ML IJ SOLN
0.2000 mg | INTRAMUSCULAR | Status: DC | PRN
  Administered 2022-08-31: 0.2 mg via INTRAVENOUS

## 2022-08-31 MED ORDER — LACTATED RINGERS IV BOLUS
1000.0000 mL | Freq: Once | INTRAVENOUS | Status: AC
  Administered 2022-08-31: 1000 mL via INTRAVENOUS

## 2022-08-31 NOTE — Assessment & Plan Note (Addendum)
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 Currently we will hold patient's  ACEI/ARB and other nephrotoxic agents. Continue with gentle continuous hydration. Avoid contrast studies. Bilateral renal ultrasound.

## 2022-08-31 NOTE — ED Notes (Signed)
This RN and Luisa Hart NT changed patient from soiled brief.  When turning patient sacral wound noted; unstageable.  Barrier cream applied with new brief.  Son at bedside aware of sacral wound.  MD Alanda Slim notified and aware of sacral wound as well.

## 2022-08-31 NOTE — Evaluation (Signed)
SLP Cancellation Note  Patient Details Name: [name] MRN: [id number] DOB: Oct 12, 1940   Cancelled treatment:       Reason Eval/Treat Not Completed: Other (comment);Patient at procedure or test/unavailable (per RN, pt currently at Xray, per notes pt has been lethargic for days - will continue efforts)  Rolena Infante, MS [facility] SLP Acute Rehab Services Office [phone]    [name], [name] 08/31/2022, 11:06 AM

## 2022-08-31 NOTE — Progress Notes (Signed)
Lower extremity venous bilateral study completed. ° °Preliminary results relayed to [place], MD. ° °See CV Proc for preliminary results report.  ° °Kallen Delatorre, RDMS, RVT °' °

## 2022-08-31 NOTE — ED Notes (Signed)
Patient transported to X-ray 

## 2022-08-31 NOTE — Assessment & Plan Note (Addendum)
Patient presenting with progressive lethargy and somnolence and decreased p.o. intake. Son at bedside states that he has arrived and recently for the past few days has noticed the same. Will try Narcan 0.1 mg and repeat.  And monitor.. Continue with IV fluid hydration pain control aspiration and fall precautions n.p.o. until bedside swallow and patient's mentation is baseline. With 0.1 mg Narcan patient had become more alert. We repeated 0.2 mg of Narcan and patient was alert awake oriented and requesting pain meds for her rib pain. Attribute patient's mental status changes secondary to pain medications that were recently initiated. Patient will need to much lower dose upon discharge.

## 2022-08-31 NOTE — Evaluation (Signed)
Clinical/Bedside Swallow Evaluation Patient Details  Name: [name] MRN: [id number] Date of Birth: Jan 26, 1941  Today's Date: 08/31/2022 Time: SLP Start Time (ACUTE ONLY): 1320 SLP Stop Time (ACUTE ONLY): 1345 SLP Time Calculation (min) (ACUTE ONLY): 25 min  Past Medical History:  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 by echo 10/2021   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/2015   Carotid artery stenosis    < 50% stenosis bilaterally by doppler 07/2016   Coronary artery disease 2008   Coronary Ca score of 331 with minimal multivessel plaque < 25% stenosis by coronary CTA 8/23   Heart murmur    per pt   Hypercholesteremia    Hypertension    Obesity    Osteopenia    declines treatment   Pneumonia 1995   Pulmonary HTN (HCC)    mild to moderate by echo 7/23 with PASP   Shoulder pain    Due to arthritis   Vitamin D insufficiency    Past Surgical History:  Past Surgical History:  Procedure Laterality Date   AORTIC VALVE REPLACEMENT N/A 10/14/2013   Procedure: AORTIC VALVE REPLACEMENT (AVR);  Surgeon: Alleen Borne, MD;  Location: [facility] OR;  Service: Open Heart Surgery;  Laterality: N/A;   CARDIAC CATHETERIZATION     CATARACT EXTRACTION, BILATERAL     CHOLECYSTECTOMY  04/08/1988   INTRAOPERATIVE TRANSESOPHAGEAL ECHOCARDIOGRAM N/A 10/14/2013   Procedure: INTRAOPERATIVE TRANSESOPHAGEAL ECHOCARDIOGRAM;  Surgeon: Alleen Borne, MD;  Location: [facility] OR;  Service: Open Heart Surgery;  Laterality: N/A;   KYPHOPLASTY Bilateral 03/27/2022   Procedure: KYPHOPLASTY AND BIOPSY THORACIC ELEVEN;  Surgeon: Lisbeth Renshaw, MD;  Location: [facility] OR;  Service: Neurosurgery;  Laterality: Bilateral;   LEFT AND RIGHT HEART CATHETERIZATION WITH CORONARY  ANGIOGRAM N/A 09/16/2013   Procedure: LEFT AND RIGHT HEART CATHETERIZATION WITH CORONARY ANGIOGRAM;  Surgeon: Quintella Reichert, MD;  Location: [facility] CATH LAB;  Service: Cardiovascular;  Laterality: N/A;   TONSILLECTOMY     HPI:  [name] is an 82 y.o. female in ed.  Patient presenting with progressive lethargy and somnolence and decreased p.o. intake.  Son at bedside states that he has arrived and recently for the past few days has noticed the same.  Chart review shows a note from oncology RN stating call documentation that since Tuesday when patient started morphine and Compazine and has been sleeping all the time and she wakes up she barely opens her eyes has not been drinking or eating very well and son Onalee Hua who is staying with her has to walk her to the restroom.  Yesterday he only gave her 1 dose of 30 mg of morphine because of this.  Per oncologist patient asked to cut back the morphine to 15 mg every day as needed.  If she has worsening symptoms or unable to arouse her or her breathing slows down to go to the emergency room.  Patient's son advised to get Narcan.  In the emergency room discussed with nurse to discuss with son about trial of low-dose Narcan and monitor for any improvement in mental status;CXR on 5/25 revealed chronic appearing increased interstitial lung markings with mild  left basilar atelectasis.  ST consulted for  swallow evaluation; pt currently NPO.    Assessment / Plan / Recommendation  Clinical Impression  Pt seen for clinical swallowing evaluation with min oral care provided d/t pt preference.  Pt's oral mucosa/oral cavity moistened prior to po intake to improve xerostomia seen during OME.  Generalized oral weakness observed with low vocal intensity and min verbal/tactile cues required to participate during examination.  Pt consumed thin liquids via successive straw sips, puree and soft solids with impaired mastication, prolonged bolus preparation, slight oral holding without  cues provided and liquid wash required during soft solids to transition into pharynx (likely d/t xerostomia).  Pt without overt s/sx of aspiration during assessment, but pt mentation places her at mild risk.  Decreased nutrition/hydration also a concern d/t pt mentation.  Initiating a conservative diet of Dysphagia 3(mechanical/soft)/thin liquids recommended to conserve energy/increase satiety overall with eventual increased nutrition/hydration accomplished.  ST will f/u briefly for diet tolerance/dysphagia education during acute stay.  Thank you for this consult. SLP Visit Diagnosis: Dysphagia, unspecified (R13.10)    Aspiration Risk  Mild aspiration risk    Diet Recommendation   Dysphagia 3/thin liquids  Medication Administration: Whole meds with liquid (as pt able or whole/puree)    Other  Recommendations Oral Care Recommendations: Oral care BID;Staff/trained caregiver to provide oral care    Recommendations for follow up therapy are one component of a multi-disciplinary discharge planning process, led by the attending physician.  Recommendations may be updated based on patient status, additional functional criteria and insurance authorization.  Follow up Recommendations Follow physician's recommendations for discharge plan and follow up therapies      Assistance Recommended at Discharge  FULL  Functional Status Assessment Patient has had a recent decline in their functional status and demonstrates the ability to make significant improvements in function in a reasonable and predictable amount of time.  Frequency and Duration min 1 x/week  1 week       Prognosis Prognosis for improved oropharyngeal function: Good      Swallow Study   General Date of Onset: 08/31/22 HPI: [name] is an 81 y.o. female in ed.  Patient presenting with progressive lethargy and somnolence and decreased p.o. intake.  Son at bedside states that he has arrived and recently for the past few days has  noticed the same.  Chart review shows a note from oncology RN stating call documentation that since Tuesday when patient started morphine and Compazine and has been sleeping all the time and she wakes up she barely opens her eyes has not been drinking or eating very well and son Onalee Hua who is staying with her has to walk her to the restroom.  Yesterday he only gave her 1 dose of 30 mg of morphine because of this.  Per oncologist patient asked to cut back the morphine to 15 mg every day as needed.  If she has worsening symptoms or unable to arouse her or her breathing slows down to go to the emergency room.  Patient's son advised to get Narcan.  In the emergency room discussed with nurse to discuss with son about trial of low-dose Narcan and monitor for any improvement in mental status;CXR on 5/25 revealed  Chronic appearing increased interstitial lung markings with mild  left basilar atelectasis.  ST consulted for swallow evaluation; pt currently NPO. Type of Study: Bedside Swallow Evaluation Previous Swallow Assessment: n/a Diet Prior to this Study: NPO Temperature Spikes Noted: No Respiratory Status: Room air History of Recent Intubation: No Behavior/Cognition:  Cooperative;Requires cueing Oral Cavity Assessment: Dry Oral Care Completed by SLP: Yes Oral Cavity - Dentition: Missing dentition;Other (Comment) (partials upper/lower) Self-Feeding Abilities: Needs assist Patient Positioning: Upright in bed Baseline Vocal Quality: Low vocal intensity Volitional Cough: Strong Volitional Swallow: Able to elicit    Oral/Motor/Sensory Function Overall Oral Motor/Sensory Function: Generalized oral weakness   Ice Chips Ice chips: Not tested   Thin Liquid Thin Liquid: Impaired Presentation: Straw Oral Phase Functional Implications: Oral holding Pharyngeal  Phase Impairments: Suspected delayed Swallow    Nectar Thick Nectar Thick Liquid: Not tested   Honey Thick Honey Thick Liquid: Not tested   Puree  Puree: Impaired Presentation: Spoon Oral Phase Impairments: Impaired mastication;Reduced lingual movement/coordination Oral Phase Functional Implications: Prolonged oral transit;Oral holding Pharyngeal Phase Impairments: Suspected delayed Swallow   Solid     Solid: Impaired Presentation: Self Fed Oral Phase Impairments: Reduced lingual movement/coordination;Impaired mastication Oral Phase Functional Implications: Impaired mastication;Prolonged oral transit Pharyngeal Phase Impairments: Suspected delayed Swallow Other Comments: required liquid wash d/t xerostomia      Pat Bekim Werntz,M.S., CCC-SLP 08/31/2022,2:31 PM

## 2022-08-31 NOTE — Evaluation (Signed)
Occupational Therapy Evaluation Patient Details Name: [name] MRN: [id number] DOB: Jan 28, 1941 Today's Date: 08/31/2022   History of Present Illness 82 year old F presenting to ED with progressive altered mental status, lethargy and poor p.o. intake and admitted for acute toxic metabolic encephalopathy likely due to pain medication and Compazine as well as AKI with azotemia and hypercalcemia.with PMH of multiple myeloma on chemo, AVR, and HTN   Clinical Impression   Pt still lethargic keeping eyes closed most of session but verbally would respond to some questions with increased time.  Pain present with movement in ribs and LEs per her report.  Overall, max assist for supine to sit EOB with min assist to complete stand pivot transfer from the bed to the recliner with therapist in front.  Pt lives alone but recently has been having her son's assist with supervision.  Her son Ferne Reus was present for session and reports that they will provided 24 hour short term supervision along with possible hired care.  Recommend acute care OT at this time to increased balance, safety, cognition, and DME awareness and use for return home at a greater level of ADL independence.       Recommendations for follow up therapy are one component of a multi-disciplinary discharge planning process, led by the attending physician.  Recommendations may be updated based on patient status, additional functional criteria and insurance authorization.   Assistance Recommended at Discharge Frequent or constant Supervision/Assistance  Patient can return home with the following A little help with walking and/or transfers;A little help with bathing/dressing/bathroom;Assistance with cooking/housework;Assist for transportation;Direct supervision/assist for medications management    Functional Status Assessment  Patient has had a recent decline in their functional status and demonstrates the ability to make significant improvements in  function in a reasonable and predictable amount of time.  Equipment Recommendations  BSC/3in1       Precautions / Restrictions Precautions Precautions: Fall Restrictions Weight Bearing Restrictions: No      Mobility Bed Mobility Overal bed mobility: Needs Assistance Bed Mobility: Supine to Sit, Sit to Supine     Supine to sit: Max assist Sit to supine: Max assist   General bed mobility comments: Assist needed for all aspects of supine to sit EOB and for transfer back to bed she needed assist with lifting LEs in the bed.    Transfers Overall transfer level: Needs assistance   Transfers: Sit to/from Stand, Bed to chair/wheelchair/BSC Sit to Stand: Min assist     Step pivot transfers: Min assist     General transfer comment: Decreased step length noted with increased time.      Balance Overall balance assessment: Needs assistance Sitting-balance support: Feet supported Sitting balance-Leahy Scale: Fair     Standing balance support: During functional activity Standing balance-Leahy Scale: Poor Standing balance comment: Needs mod assist for stand pivot transfer without use of an assistive device.                           ADL either performed or assessed with clinical judgement   ADL Overall ADL's : Needs assistance/impaired                     Lower Body Dressing: Total assistance Lower Body Dressing Details (indicate cue type and reason): simulated Toilet Transfer: Minimal assistance;Stand-pivot Toilet Transfer Details (indicate cue type and reason): simulated stand pivot at EOB Toileting- Clothing Manipulation and Hygiene: Sit to/from stand;Maximal assistance Toileting - Clothing  Manipulation Details (indicate cue type and reason): simulated     Functional mobility during ADLs: Minimal assistance (stand pivot transfer to the recliner) General ADL Comments: Pt's son [name] present for session and provided most of PLOF information and setup.   He will work on arranging initial 24 hour supervision at discharge between hired care and family.  Pt found with Purewick not working and bed soaked with urine.  Max assist for transfer to the EOB from supine with min assist for stand pivot transfer to the recliner in order for gown and bed linens to be changed.  Pt's O2 sats at 93-90% on room air throughout session.     Vision Baseline Vision/History: 0 No visual deficits Additional Comments: Unable to assess vision secondary to cognition and attention, but would likely assume no changes from baseline based on diagnosis     Perception  Not tested   Praxis  Not tested    Pertinent Vitals/Pain Pain Assessment Pain Assessment: Faces Faces Pain Scale: Hurts a little bit Pain Location: ribs and legs Pain Descriptors / Indicators: Aching, Discomfort, Grimacing Pain Intervention(s): Limited activity within patient's tolerance, Repositioned     Hand Dominance Right   Extremity/Trunk Assessment Upper Extremity Assessment Upper Extremity Assessment: Generalized weakness   Lower Extremity Assessment Lower Extremity Assessment: Defer to PT evaluation   Cervical / Trunk Assessment Cervical / Trunk Assessment: Normal   Communication     Cognition Arousal/Alertness: Lethargic   Overall Cognitive Status: Impaired/Different from baseline Area of Impairment: Orientation, Attention                               General Comments: Pt with eyes closed 75% of session.  Followed one step commands with increased time for bed mobility and transfers.                Home Living Family/patient expects to be discharged to:: Private residence Living Arrangements: Children Available Help at Discharge: Family (family will have to arrange 24 hr) Type of Home: House Home Access: Ramped entrance     Home Layout: One level     Bathroom Shower/Tub: Producer, television/film/video: Standard Bathroom Accessibility: Yes How  Accessible: Accessible via walker Home Equipment: Rolling Walker (2 wheels);Cane - single point          Prior Functioning/Environment Prior Level of Function : Independent/Modified Independent             Mobility Comments: used walker in the house ADLs Comments: pt and son report she was independent before this medical episode        OT Problem List: Decreased strength;Decreased knowledge of use of DME or AE;Decreased activity tolerance;Impaired balance (sitting and/or standing);Pain;Decreased cognition;Decreased safety awareness      OT Treatment/Interventions: Self-care/ADL training;Patient/family education;Balance training;Neuromuscular education;Therapeutic activities;DME and/or AE instruction    OT Goals(Current goals can be found in the care plan section) Acute Rehab OT Goals Patient Stated Goal: Pt did not state but son wants her to get back to baseline to tackle her chemo and return home with family and caregiver support. OT Goal Formulation: With patient Time For Goal Achievement: 09/14/22 Potential to Achieve Goals: Good  OT Frequency: Min 2X/week       AM-PAC OT "6 Clicks" Daily Activity     Outcome Measure Help from another person eating meals?: A Little Help from another person taking care of personal grooming?: A Little Help from another  person toileting, which includes using toliet, bedpan, or urinal?: A Lot Help from another person bathing (including washing, rinsing, drying)?: A Lot Help from another person to put on and taking off regular upper body clothing?: A Little Help from another person to put on and taking off regular lower body clothing?: A Lot 6 Click Score: 15   End of Session Nurse Communication: Mobility status  Activity Tolerance: Patient limited by lethargy Patient left: in bed;with call bell/phone within reach  OT Visit Diagnosis: Unsteadiness on feet (R26.81);Muscle weakness (generalized) (M62.81);Other symptoms and signs involving  cognitive function;Pain Pain - part of body:  (ribs)                Time: 1535-1620 OT Time Calculation (min): 45 min Charges:  OT General Charges $OT Visit: 1 Visit OT Evaluation $OT Eval Moderate Complexity: 1 Mod OT Treatments $Self Care/Home Management : 23-37 mins Perrin Maltese, OTR/L Acute Rehabilitation Services  Office [phone] 08/31/2022

## 2022-08-31 NOTE — Plan of Care (Signed)

## 2022-08-31 NOTE — ED Notes (Signed)
ED TO INPATIENT HANDOFF REPORT  ED Nurse Name and Phone #: Zakeya Junker [phone]  S Name/Age/Gender [name] 82 y.o. female Room/Bed: H012C/H012C  Code Status   Code Status: Full Code  Home/SNF/Other Home Patient oriented to: self Is this baseline? No   Triage Complete: Triage complete  Chief Complaint AMS (altered mental status) [R41.82]  Triage Note Pt from home by ems. Pt a and o x 4 with minor confusion at baseline. Today has stopped talking,eating, and has not urinated   Allergies Allergies  Allergen Reactions   Crestor [Rosuvastatin Calcium] Other (See Comments)    Myalgias with 5mg  daily dose   Lactose Other (See Comments)   Lipitor [Atorvastatin] Other (See Comments)    Stomach pain   Pravastatin Other (See Comments)    Myalgias    Simvastatin Other (See Comments)    Myalgias    Tramadol Other (See Comments)    FEELS LOOPY   Zetia [Ezetimibe]     Myalgias   Codeine Palpitations   Penicillins Palpitations   Sulfa Antibiotics Palpitations   Vancomycin Itching and Other (See Comments)    Pt developed flushing and itchy scalp greater than into the infusion.  RedMan syndrone developed.  Will need slower admin rate for future infusions.    Level of Care/Admitting Diagnosis ED Disposition     ED Disposition  Admit   Condition  --   Comment  Hospital Area: [facility] [[id number]]  Level of Care: Telemetry Medical [104]  May place patient in observation at [facility] or [facility] if equivalent level of care is available:: Yes  Covid Evaluation: Asymptomatic - no recent exposure (last 10 days) testing not required  Diagnosis: AMS (altered mental status) [9811914]  Admitting Physician: Darrold Junker  Attending Physician: Darrold Junker          B Medical/Surgery History Past Medical History:  Diagnosis Date   Abnormal vaginal Pap smear 1994   annual paps for years after that.more recently every other  year,last in 2012-we agreed not to do them anymore   Anxiety    no rx   Aortic stenosis    s/p AVR with bioprosthesis   Ascending aorta dilatation (HCC)    40mm by echo 10/2021   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/2015   Carotid artery stenosis    < 50% stenosis bilaterally by doppler 07/2016   Coronary artery disease 2008   Coronary Ca score of 331 with minimal multivessel plaque < 25% stenosis by coronary CTA 8/23   Heart murmur    per pt   Hypercholesteremia    Hypertension    Obesity    Osteopenia    declines treatment   Pneumonia 1995   Pulmonary HTN (HCC)    mild to moderate by echo 7/23 with PASP   Shoulder pain    Due to arthritis   Vitamin D insufficiency    Past Surgical History:  Procedure Laterality Date   AORTIC VALVE REPLACEMENT N/A 10/14/2013   Procedure: AORTIC VALVE REPLACEMENT (AVR);  Surgeon: Alleen Borne, MD;  Location: [facility] OR;  Service: Open Heart Surgery;  Laterality: N/A;   CARDIAC CATHETERIZATION     CATARACT EXTRACTION, BILATERAL     CHOLECYSTECTOMY  04/08/1988   INTRAOPERATIVE TRANSESOPHAGEAL ECHOCARDIOGRAM N/A 10/14/2013   Procedure: INTRAOPERATIVE TRANSESOPHAGEAL ECHOCARDIOGRAM;  Surgeon: Alleen Borne, MD;  Location: [facility] OR;  Service: Open Heart Surgery;  Laterality: N/A;   KYPHOPLASTY Bilateral 03/27/2022   Procedure: KYPHOPLASTY AND  BIOPSY THORACIC ELEVEN;  Surgeon: Lisbeth Renshaw, MD;  Location: [facility] OR;  Service: Neurosurgery;  Laterality: Bilateral;   LEFT AND RIGHT HEART CATHETERIZATION WITH CORONARY ANGIOGRAM N/A 09/16/2013   Procedure: LEFT AND RIGHT HEART CATHETERIZATION WITH CORONARY ANGIOGRAM;  Surgeon: Quintella Reichert, MD;  Location: [facility] CATH LAB;  Service: Cardiovascular;  Laterality: N/A;   TONSILLECTOMY       A IV Location/Drains/Wounds Patient Lines/Drains/Airways Status     Active Line/Drains/Airways     Name Placement date Placement time Site Days   Peripheral IV 08/31/22 Anterior;Distal;Left;Upper Arm 08/31/22  0030  Arm  less than  1            Intake/Output Last 24 hours  Intake/Output Summary (Last 24 hours) at 08/31/2022 1100 Last data filed at 08/31/2022 0300 Gross per 24 hour  Intake 1000 ml  Output --  Net 1000 ml    Labs/Imaging Results for orders placed or performed during the hospital encounter of 08/30/22 (from the past 48 hour(s))  Basic metabolic panel     Status: Abnormal   Collection Time: 08/30/22 11:48 PM  Result Value Ref Range   Sodium 127 (L) 135 - 145 mmol/L   Potassium 3.5 3.5 - 5.1 mmol/L   Chloride 89 (L) 98 - 111 mmol/L   CO2 29 22 - 32 mmol/L   Glucose, Bld 89 70 - 99 mg/dL    Comment: Glucose reference range applies only to samples taken after fasting for at least 8 hours.   BUN 58 (H) 8 - 23 mg/dL   Creatinine, Ser 7.82 (H) 0.44 - 1.00 mg/dL   Calcium 95.6 (H) 8.9 - 10.3 mg/dL   GFR, Estimated 17 (L) >60 mL/min    Comment: (NOTE) Calculated using the CKD-EPI Creatinine Equation (2021)    Anion gap 9 5 - 15    Comment: Performed at [facility], [address], [place], [place] [address]  Troponin I (High Sensitivity)     Status: Abnormal   Collection Time: 08/30/22 11:48 PM  Result Value Ref Range   Troponin I (High Sensitivity) 46 (H) <18 ng/L    Comment: (NOTE) Elevated high sensitivity troponin I (hsTnI) values and significant  changes across serial measurements may suggest ACS but many other  chronic and acute conditions are known to elevate hsTnI results.  Refer to the "Links" section for chest pain algorithms and additional  guidance. Performed at [facility], [address], [place], [place] [address]   CBC with Differential     Status: Abnormal   Collection Time: 08/30/22 11:48 PM  Result Value Ref Range   WBC 13.5 (H) 4.0 - 10.5 K/uL   RBC 2.72 (L) 3.87 - 5.11 MIL/uL   Hemoglobin 8.2 (L) 12.0 - 15.0 g/dL   HCT 69.6 (L) 29.5 - 28.4 %   MCV 95.2 80.0 - 100.0 fL   MCH 30.1 26.0 - 34.0 pg   MCHC 31.7 30.0 - 36.0 g/dL   RDW 13.2 (H) 44.0 -  15.5 %   Platelets 315 150 - 400 K/uL   nRBC 0.0 0.0 - 0.2 %   Neutrophils Relative % 82 %   Neutro Abs 11.0 (H) 1.7 - 7.7 K/uL   Lymphocytes Relative 11 %   Lymphs Abs 1.5 0.7 - 4.0 K/uL   Monocytes Relative 6 %   Monocytes Absolute 0.8 0.1 - 1.0 K/uL   Eosinophils Relative 0 %   Eosinophils Absolute 0.0 0.0 - 0.5 K/uL  Basophils Relative 0 %   Basophils Absolute 0.0 0.0 - 0.1 K/uL   Immature Granulocytes 1 %   Abs Immature Granulocytes 0.11 (H) 0.00 - 0.07 K/uL    Comment: Performed at [facility], [address] [place], [place] [address]  D-dimer, quantitative     Status: Abnormal   Collection Time: 08/30/22 11:48 PM  Result Value Ref Range   D-Dimer, Quant 3.73 (H) 0.00 - 0.50 ug/mL-FEU    Comment: (NOTE) At the manufacturer cut-off value of 0.5 g/mL FEU, this assay has a negative predictive value of 95-100%.This assay is intended for use in conjunction with a clinical pretest probability (PTP) assessment model to exclude pulmonary embolism (PE) and deep venous thrombosis (DVT) in outpatients suspected of PE or DVT. Results should be correlated with clinical presentation. Performed at [facility], [address], [place], [place] [address]   Hepatic function panel     Status: Abnormal   Collection Time: 08/31/22 12:31 AM  Result Value Ref Range   Total Protein 11.0 (H) 6.5 - 8.1 g/dL   Albumin <7.8 (L) 3.5 - 5.0 g/dL   AST 55 (H) 15 - 41 U/L   ALT 31 0 - 44 U/L   Alkaline Phosphatase 123 38 - 126 U/L   Total Bilirubin 0.9 0.3 - 1.2 mg/dL   Bilirubin, Direct 0.1 0.0 - 0.2 mg/dL   Indirect Bilirubin 0.8 0.3 - 0.9 mg/dL    Comment: Performed at [facility], [address] [place], [place] [address]  Urinalysis, w/ Reflex to Culture (Infection Suspected) -Urine, Clean Catch     Status: Abnormal   Collection Time: 08/31/22 12:45 AM  Result Value Ref Range   Specimen Source URINE, CLEAN CATCH    Color, Urine YELLOW YELLOW   APPearance HAZY (A)  CLEAR   Specific Gravity, Urine 1.013 1.005 - 1.030   pH 5.0 5.0 - 8.0   Glucose, UA NEGATIVE NEGATIVE mg/dL   Hgb urine dipstick MODERATE (A) NEGATIVE   Bilirubin Urine NEGATIVE NEGATIVE   Ketones, ur 5 (A) NEGATIVE mg/dL   Protein, ur NEGATIVE NEGATIVE mg/dL   Nitrite NEGATIVE NEGATIVE   Leukocytes,Ua NEGATIVE NEGATIVE   RBC / HPF 0-5 0 - 5 RBC/hpf   WBC, UA 0-5 0 - 5 WBC/hpf    Comment:        Reflex urine culture not performed if WBC <=10, OR if Squamous epithelial cells >5. If Squamous epithelial cells >5 suggest recollection.    Bacteria, UA RARE (A) NONE SEEN   Squamous Epithelial / HPF 0-5 0 - 5 /HPF   Mucus PRESENT    Hyaline Casts, UA PRESENT     Comment: Performed at [facility], [address], [place], [place] [address]  Troponin I (High Sensitivity)     Status: Abnormal   Collection Time: 08/31/22  1:40 AM  Result Value Ref Range   Troponin I (High Sensitivity) 51 (H) <18 ng/L    Comment: (NOTE) Elevated high sensitivity troponin I (hsTnI) values and significant  changes across serial measurements may suggest ACS but many other  chronic and acute conditions are known to elevate hsTnI results.  Refer to the "Links" section for chest pain algorithms and additional  guidance. Performed at [facility], [address], [place], [place] [address]   TSH     Status: None   Collection Time: 08/31/22  5:14 AM  Result Value Ref Range   TSH 0.767  0.350 - 4.500 uIU/mL    Comment: Performed by a 3rd Generation assay with a functional sensitivity of <=0.01 uIU/mL. Performed at [facility], [address], [place], [place] [address]   Type and screen [facility]     Status: None   Collection Time: 08/31/22  5:14 AM  Result Value Ref Range   ABO/RH(D) O POS    Antibody Screen NEG    Sample Expiration      09/03/2022,2359 Performed at [facility], [address], [place], [place] [address]   CBC with Differential/Platelet      Status: Abnormal   Collection Time: 08/31/22  5:16 AM  Result Value Ref Range   WBC 17.8 (H) 4.0 - 10.5 K/uL   RBC 2.56 (L) 3.87 - 5.11 MIL/uL   Hemoglobin 7.5 (L) 12.0 - 15.0 g/dL   HCT 09.8 (L) 11.9 - 14.7 %   MCV 94.5 80.0 - 100.0 fL   MCH 29.3 26.0 - 34.0 pg   MCHC 31.0 30.0 - 36.0 g/dL   RDW 82.9 (H) 56.2 - 13.0 %   Platelets 299 150 - 400 K/uL   nRBC 0.0 0.0 - 0.2 %   Neutrophils Relative % 76 %   Neutro Abs 13.5 (H) 1.7 - 7.7 K/uL   Lymphocytes Relative 12 %   Lymphs Abs 2.2 0.7 - 4.0 K/uL   Monocytes Relative 11 %   Monocytes Absolute 1.9 (H) 0.1 - 1.0 K/uL   Eosinophils Relative 0 %   Eosinophils Absolute 0.0 0.0 - 0.5 K/uL   Basophils Relative 0 %   Basophils Absolute 0.0 0.0 - 0.1 K/uL   Immature Granulocytes 1 %   Abs Immature Granulocytes 0.18 (H) 0.00 - 0.07 K/uL    Comment: Performed at [facility], [address], [place], [place] [address]  Comprehensive metabolic panel     Status: Abnormal   Collection Time: 08/31/22  5:16 AM  Result Value Ref Range   Sodium 128 (L) 135 - 145 mmol/L   Potassium 3.6 3.5 - 5.1 mmol/L    Comment: HEMOLYSIS AT THIS LEVEL MAY AFFECT RESULT   Chloride 95 (L) 98 - 111 mmol/L   CO2 22 22 - 32 mmol/L   Glucose, Bld 106 (H) 70 - 99 mg/dL    Comment: Glucose reference range applies only to samples taken after fasting for at least 8 hours.   BUN 57 (H) 8 - 23 mg/dL   Creatinine, Ser 4.69 (H) 0.44 - 1.00 mg/dL   Calcium 9.5 8.9 - 62.9 mg/dL   Total Protein 52.8 (H) 6.5 - 8.1 g/dL   Albumin <4.1 (L) 3.5 - 5.0 g/dL   AST 73 (H) 15 - 41 U/L    Comment: HEMOLYSIS AT THIS LEVEL MAY AFFECT RESULT   ALT 35 0 - 44 U/L    Comment: HEMOLYSIS AT THIS LEVEL MAY AFFECT RESULT   Alkaline Phosphatase 114 38 - 126 U/L   Total Bilirubin 1.4 (H) 0.3 - 1.2 mg/dL    Comment: HEMOLYSIS AT THIS LEVEL MAY AFFECT RESULT   GFR, Estimated 20 (L) >60 mL/min    Comment: (NOTE) Calculated using the CKD-EPI Creatinine Equation (2021)    Anion gap  11 5 - 15    Comment: Performed at [facility], [address] [place], [place] [address]  Magnesium     Status: None   Collection Time: 08/31/22  5:16 AM  Result Value Ref Range  Magnesium 1.8 1.7 - 2.4 mg/dL    Comment: Performed at [facility], [address], [place], [place] [address]  Phosphorus     Status: Abnormal   Collection Time: 08/31/22  5:16 AM  Result Value Ref Range   Phosphorus >30.0 (H) 2.5 - 4.6 mg/dL    Comment: RESULT CONFIRMED BY MANUAL DILUTION Performed at [facility], [address], [place], [place] [address]   T4, free     Status: None   Collection Time: 08/31/22  5:16 AM  Result Value Ref Range   Free T4 1.09 0.61 - 1.12 ng/dL    Comment: HEMOLYSIS AT THIS LEVEL MAY AFFECT RESULT (NOTE) Biotin ingestion may interfere with free T4 tests. If the results are inconsistent with the TSH level, previous test results, or the clinical presentation, then consider biotin interference. If needed, order repeat testing after stopping biotin. Performed at [facility], [address] [place], [place] [address]   Troponin I (High Sensitivity)     Status: Abnormal   Collection Time: 08/31/22  5:16 AM  Result Value Ref Range   Troponin I (High Sensitivity) 55 (H) <18 ng/L    Comment: (NOTE) Elevated high sensitivity troponin I (hsTnI) values and significant  changes across serial measurements may suggest ACS but many other  chronic and acute conditions are known to elevate hsTnI results.  Refer to the "Links" section for chest pain algorithms and additional  guidance. Performed at [facility], [address], [place], [place] [address]   Troponin I (High Sensitivity)     Status: Abnormal   Collection Time: 08/31/22  6:54 AM  Result Value Ref Range   Troponin I (High Sensitivity) 78 (H) <18 ng/L    Comment: READ BACK AND VERIFIED WITH R Mandel Seiden RN AT [phone] BY D LONG DELTA CHECK NOTED (NOTE) Elevated high sensitivity  troponin I (hsTnI) values and significant  changes across serial measurements may suggest ACS but many other  chronic and acute conditions are known to elevate hsTnI results.  Refer to the Links section for chest pain algorithms and additional  guidance. Performed at [facility], [address], [place], [place] [address]    US RENAL  Result Date: 08/31/2022 CLINICAL DATA:  Acute kidney injury EXAM: RENAL / URINARY TRACT ULTRASOUND COMPLETE COMPARISON:  07/30/2022 FINDINGS: Right Kidney: Renal measurements: 10.5 x 4.7 x 5 cm = volume: 130 mL. Echogenicity within normal limits. No mass or hydronephrosis visualized. Left Kidney: Renal measurements: 10.2 x 6 x 5.2 cm = volume: 170 mL. Echogenicity within normal limits. No mass or hydronephrosis visualized. Bladder: Appears normal for degree of bladder distention. Other: Echogenic liver consistent with steatosis. 3.5 cm pelvic cyst which is left-sided, ovarian, and underestimated when compared to recent PET CT at which time recommendations were provided for follow-up. IMPRESSION: Normal ultrasound of the kidneys and bladder. Electronically Signed   By: Tiburcio Pea M.D.   On: 08/31/2022 06:33   CT Head Wo Contrast  Result Date: 08/31/2022 CLINICAL DATA:  Altered mental status. EXAM: CT HEAD WITHOUT CONTRAST TECHNIQUE: Contiguous axial images were obtained from the base of the skull through the vertex without intravenous contrast. RADIATION DOSE REDUCTION: This exam was performed according to the departmental dose-optimization program which includes automated exposure control, adjustment of the mA and/or kV according to patient size and/or use of iterative reconstruction technique. COMPARISON:  May 15, 2020 FINDINGS: Brain: There is mild cerebral atrophy with  widening of the extra-axial spaces and ventricular dilatation. There are areas of decreased attenuation within the white matter tracts of the supratentorial brain, consistent with  microvascular disease changes. Vascular: No hyperdense vessel or unexpected calcification. Skull: Innumerable small lytic lesions are seen scattered throughout the skull. This represents a new finding when compared to the prior study. Sinuses/Orbits: No acute finding. Other: None. IMPRESSION: 1. No acute intracranial abnormality. 2. 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 3. Generalized cerebral atrophy with widening of the extra-axial spaces and ventricular dilatation. 4. 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 Electronically Signed   By: Aram Candela M.D.   On: 08/31/2022 01:22   DG Chest 2 View  Result Date: 08/31/2022 CLINICAL DATA:  Chest pain. EXAM: CHEST - 2 VIEW COMPARISON:  July 30, 2022 FINDINGS: Multiple sternal wires are noted. The cardiac silhouette is borderline in size and stable in appearance. An artificial aortic valve is seen. Mild to moderate severity diffuse, chronic appearing increased interstitial lung markings are present. Mild atelectasis is seen within the left lung base. There is no evidence of a pleural effusion or pneumothorax. Multilevel degenerative changes are seen throughout the thoracic spine. IMPRESSION: 1. 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 2. Chronic appearing increased interstitial lung markings with mild left basilar atelectasis. Electronically Signed   By: Aram Candela M.D.   On: 08/31/2022 00:23    Pending Labs Unresulted Labs (From admission, onward)     Start     Ordered   08/31/22 1046  Renal function panel  Once,   R        08/31/22 1045            Vitals/Pain Today's Vitals   08/31/22 0301 08/31/22 0302 08/31/22 0522 08/31/22 0848  BP: (!) 139/42  (!) 164/45 (!) 148/50  Pulse: 89  99 87  Resp: 14   18  Temp:  98.7 F (37.1 C) 98.2 F (36.8 C) 98.6 F (37 C)  TempSrc:  Oral Oral Oral  SpO2: 94%   99% 94%  Weight:      Height:      PainSc: 0-No pain  0-No pain     Isolation Precautions No active isolations  Medications Medications  acetaminophen (TYLENOL) tablet 650 mg (has no administration in time range)  heparin injection 5,000 Units (5,000 Units Subcutaneous Given 08/31/22 0520)  sodium chloride flush (NS) 0.9 % injection 3 mL (3 mLs Intravenous Not Given 08/31/22 0914)  0.9 %  sodium chloride infusion ( Intravenous Rate/Dose Change 08/31/22 1048)  hydrALAZINE (APRESOLINE) injection 5 mg (has no administration in time range)  naloxone [facility]) injection 0.4 mg (has no administration in time range)  sodium chloride 0.9 % bolus 1,000 mL (0 mLs Intravenous Stopped 08/31/22 0300)  lactated ringers bolus 1,000 mL (1,000 mLs Intravenous Bolus 08/31/22 0301)    Mobility walks with device     Focused Assessments Neuro Assessment        Neuro Assessment: Exceptions to WDL Neuro Checks:     If patient is a Neuro Trauma and patient is going to OR before floor call report to 4N Charge nurse: [phone] or [phone]   R Recommendations: See Admitting Provider Note  Report given to:   Additional Notes:

## 2022-08-31 NOTE — Progress Notes (Signed)
PROGRESS NOTE  [name]:[id number] DOB: 11/25/1940   PCP: [name], MD  Patient is from: Home.   DOA: 08/30/2022 LOS: 0  Chief complaints Chief Complaint  Patient presents with   Altered Mental Status    Pt has stopped speaking/eating/urination today     Brief Narrative / Interim history: 82 year old F with PMH of multiple myeloma on chemo, AVR,  and HTN presenting to ED with progressive altered mental status, lethargy and poor p.o. intake and admitted for acute toxic metabolic encephalopathy likely due to pain medication and Compazine as well as AKI with azotemia and hypercalcemia.  Symptoms started after she was started on morphine 30 mg twice daily on 5/15.  She was also on Compazine.  Reportedly, mental status improved after Narcan injection in ED.  Vital stable. Na 127 (about baseline). Cr 2.73 (1.0 on 5/17). Ca 10.5> 13.5 when corrected for hypoalbuminemia.  WBC 13.5.  Hgb 8.2 (baseline).  Total protein elevated to 11.0.  Albumin <1.5.  D-dimer 3.73.  Troponin in 50s but flat.  UA with 5 ketones.  Patient was started on IV fluid and admitted for further care.   Subjective: Seen and examined earlier this morning.  No major events overnight of this morning.  Sleepy but wakes to voice.  She is only oriented to self.  Follows commands.  Reports back pain and right-sided rib pain.  Denies chest pain, shortness of breath or abdominal pain.  Patient's son, [name] at bedside.  Objective: Vitals:   08/31/22 0301 08/31/22 0302 08/31/22 0522 08/31/22 0848  BP: (!) 139/42  (!) 164/45 (!) 148/50  Pulse: 89  99 87  Resp: 14   18  Temp:  98.7 F (37.1 C) 98.2 F (36.8 C) 98.6 F (37 C)  TempSrc:  Oral Oral Oral  SpO2: 94%  99% 94%  Weight:      Height:        Examination:  GENERAL: No apparent distress.  Nontoxic. HEENT: Dry mucous membranes.  Vision and hearing grossly intact.  NECK: Supple.  No apparent JVD.  RESP:  No IWOB.  Fair aeration bilaterally. CVS: 2/6 SEM  over RUSB and LUSB.Marland Kitchen Heart sounds normal.  ABD/GI/GU: BS+. Abd soft, NTND.  MSK/EXT:  Moves extremities. No apparent deformity. No edema.  SKIN: Anesthesia for sacral wound. NEURO: Sleepy but wakes to voice.  Not alert.  Oriented only to self.  Follows commands.  No focal neurodeficit. PSYCH: Calm.  No distress or agitation.  Procedures:  None  Microbiology summarized: None  Assessment and plan: Principal Problem:   AMS (altered mental status) Active Problems:   Electrolyte abnormality   Pathological fracture of thoracic vertebra   Essential hypertension, benign   Acute renal failure superimposed on stage 3a chronic kidney disease (HCC)   Multiple myeloma (HCC)   Anemia  Acute toxic metabolic encephalopathy: Due to opiate, Compazine, dehydration and renal failure.  Symptoms started after patient was started on MS Contin about a week ago.  No focal neurodeficit.  CT head without acute finding.  Still dehydrated.  Has leukocytosis but low suspicion for infection.  Leukocytosis likely related to malignancy.  Seems to have improved with Narcan but she is still sleepy.  She wakes to voice but only oriented to self.  She is not quite alert.  -Hold opiate and-sedating medications.  Discontinued IV morphine -Reorientation and delirium precaution -Treat treatable causes-dehydration/renal failure -PT/OT/SLP -N.p.o. pending SLP eval -IV fluid for hydration  AKI/azotemia: Cr 2.73 (baseline ranges from  0.8-1.0).  Likely due to dehydration from poor p.o. intake and concurrent use of HCTZ and lisinopril.  Renal US without acute finding.  UA with some ketonuria suggesting dehydration. Recent Labs    12/27/21 1052 01/24/22 1407 01/29/22 0157 02/25/22 0855 03/15/22 1738 07/30/22 2000 08/06/22 1330 08/22/22 1059 08/30/22 2348 08/31/22 0516  BUN 15 16 20 17 16  24* 29* 27* 58* 57*  CREATININE 0.94 0.91 1.08* 0.93 0.79 1.01* 1.18* 1.03* 2.73* 2.37*  -Increase IVF to 125 cc an  hour -Discontinue IV morphine.  Use fentanyl if pain medication needed -Strict intake and output -Recheck renal panel this morning  Hypercalcemia: Ca 10.5 and corrects to 13.51 hypoalbuminemia.  Improved. -Increase IV fluids as above.  Chronic hyponatremia: Likely from dehydration.  SIADH is also a possibility.  Stable. -Continue monitoring  Hypophosphatemia: P > 30 -Recheck renal function  Multiple myeloma: Since she is on chemotherapy.  Followed by Dr. Arbutus Ped. -Outpatient follow-up  Chronic pathologic T11 compression fracture/right hip fracture/chronic back pain: -Pain control.  Anemia of chronic disease: Slight drop in Hgb likely dilutional. Recent Labs    01/24/22 1407 01/29/22 0157 02/25/22 0855 03/15/22 1738 07/30/22 1706 08/06/22 1330 08/20/22 1025 08/22/22 1059 08/30/22 2348 08/31/22 0516  HGB 12.2 10.6* 11.6* 11.6* 8.0* 7.6* 8.8* 8.3* 8.2* 7.5*  -Continue monitoring  Elevated troponin: Suspect demand ischemia and delayed clearance due to AKI.  No cardiopulmonary symptoms.   Elevated D-dimer: Low suspicion for PE. -LE venous Doppler   Essential hypertension: BP slightly elevated. -IV labetalol as needed  Unstageable sacral wound -Check pelvic x-ray to exclude osseous involvement -WOCN consulted  Leukocytosis/bandemia: Likely demargination.  No clear source of infection at the moment  Goals of care counseling: Discussed CODE STATUS with patient's son, [name] at bedside.  Discussed pros and cons of CPR and intubation.  Recommended DNR. [name] likes to discuss with his older brother who is patient's [facility] POA before making decision.  For now, she remains full code.  Body mass index is 26.78 kg/m.           DVT prophylaxis:  heparin injection 5,000 Units Start: 08/31/22 0600  Code Status: Full code Family Communication: Updated patient's son, [name] at bedside. Level of care: Telemetry Medical Status is: Observation The patient will require care  spanning > 2 midnights and should be moved to inpatient because: Acute encephalopathy, AKI and electrolyte derangement   Final disposition: TBD Consultants:  None  55 minutes with more than 50% spent in reviewing records, counseling patient/family and coordinating care.   Sch Meds:  Scheduled Meds:  heparin  5,000 Units Subcutaneous Q8H   sodium chloride flush  3 mL Intravenous Q12H   Continuous Infusions:  sodium chloride 125 mL/hr at 08/31/22 1048   PRN Meds:.acetaminophen, hydrALAZINE, naLOXone (NARCAN)  injection  Antimicrobials: Anti-infectives (From admission, onward)    None        I have personally reviewed the following labs and images: CBC: Recent Labs  Lab 08/30/22 2348 08/31/22 0516  WBC 13.5* 17.8*  NEUTROABS 11.0* 13.5*  HGB 8.2* 7.5*  HCT 25.9* 24.2*  MCV 95.2 94.5  PLT 315 299   BMP &GFR Recent Labs  Lab 08/30/22 2348 08/31/22 0516  NA 127* 128*  K 3.5 3.6  CL 89* 95*  CO2 29 22  GLUCOSE 89 106*  BUN 58* 57*  CREATININE 2.73* 2.37*  CALCIUM 10.5* 9.5  MG  --  1.8  PHOS  --  >30.0*   Estimated Creatinine Clearance: 15.2 mL/min (  A) (by C-G formula based on SCr of 2.37 mg/dL (H)). Liver & Pancreas: Recent Labs  Lab 08/31/22 0031 08/31/22 0516  AST 55* 73*  ALT 31 35  ALKPHOS 123 114  BILITOT 0.9 1.4*  PROT 11.0* 10.3*  ALBUMIN <1.5* <1.5*   No results for input(s): "LIPASE", "AMYLASE" in the last 168 hours. No results for input(s): "AMMONIA" in the last 168 hours. Diabetic: No results for input(s): "HGBA1C" in the last 72 hours. No results for input(s): "GLUCAP" in the last 168 hours. Cardiac Enzymes: No results for input(s): "CKTOTAL", "CKMB", "CKMBINDEX", "TROPONINI" in the last 168 hours. No results for input(s): "PROBNP" in the last 8760 hours. Coagulation Profile: No results for input(s): "INR", "PROTIME" in the last 168 hours. Thyroid Function Tests: Recent Labs    08/31/22 0514 08/31/22 0516  TSH 0.767  --    FREET4  --  1.09   Lipid Profile: No results for input(s): "CHOL", "HDL", "LDLCALC", "TRIG", "CHOLHDL", "LDLDIRECT" in the last 72 hours. Anemia Panel: No results for input(s): "VITAMINB12", "FOLATE", "FERRITIN", "TIBC", "IRON", "RETICCTPCT" in the last 72 hours. Urine analysis:    Component Value Date/Time   COLORURINE YELLOW 08/31/2022 0045   APPEARANCEUR HAZY (A) 08/31/2022 0045   LABSPEC 1.013 08/31/2022 0045   PHURINE 5.0 08/31/2022 0045   GLUCOSEU NEGATIVE 08/31/2022 0045   HGBUR MODERATE (A) 08/31/2022 0045   BILIRUBINUR NEGATIVE 08/31/2022 0045   KETONESUR 5 (A) 08/31/2022 0045   PROTEINUR NEGATIVE 08/31/2022 0045   UROBILINOGEN 0.2 10/12/2013 1117   NITRITE NEGATIVE 08/31/2022 0045   LEUKOCYTESUR NEGATIVE 08/31/2022 0045   Sepsis Labs: Invalid input(s): "PROCALCITONIN", "LACTICIDVEN"  Microbiology: No results found for this or any previous visit (from the past 240 hour(s)).  Radiology Studies: US RENAL  Result Date: 08/31/2022 CLINICAL DATA:  Acute kidney injury EXAM: RENAL / URINARY TRACT ULTRASOUND COMPLETE COMPARISON:  07/30/2022 FINDINGS: Right Kidney: Renal measurements: 10.5 x 4.7 x 5 cm = volume: 130 mL. Echogenicity within normal limits. No mass or hydronephrosis visualized. Left Kidney: Renal measurements: 10.2 x 6 x 5.2 cm = volume: 170 mL. Echogenicity within normal limits. No mass or hydronephrosis visualized. Bladder: Appears normal for degree of bladder distention. Other: Echogenic liver consistent with steatosis. 3.5 cm pelvic cyst which is left-sided, ovarian, and underestimated when compared to recent PET CT at which time recommendations were provided for follow-up. IMPRESSION: Normal ultrasound of the kidneys and bladder. Electronically Signed   By: Tiburcio Pea M.D.   On: 08/31/2022 06:33   CT Head Wo Contrast  Result Date: 08/31/2022 CLINICAL DATA:  Altered mental status. EXAM: CT HEAD WITHOUT CONTRAST TECHNIQUE: Contiguous axial images were  obtained from the base of the skull through the vertex without intravenous contrast. RADIATION DOSE REDUCTION: This exam was performed according to the departmental dose-optimization program which includes automated exposure control, adjustment of the mA and/or kV according to patient size and/or use of iterative reconstruction technique. COMPARISON:  May 15, 2020 FINDINGS: Brain: There is mild cerebral atrophy with widening of the extra-axial spaces and ventricular dilatation. There are areas of decreased attenuation within the white matter tracts of the supratentorial brain, consistent with microvascular disease changes. Vascular: No hyperdense vessel or unexpected calcification. Skull: Innumerable small lytic lesions are seen scattered throughout the skull. This represents a new finding when compared to the prior study. Sinuses/Orbits: No acute finding. Other: None. IMPRESSION: 1. No acute intracranial abnormality. 2. 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 3. Generalized cerebral atrophy with widening of the extra-axial spaces and ventricular dilatation. 4. 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 Electronically Signed   By: Aram Candela M.D.   On: 08/31/2022 01:22   DG Chest 2 View  Result Date: 08/31/2022 CLINICAL DATA:  Chest pain. EXAM: CHEST - 2 VIEW COMPARISON:  July 30, 2022 FINDINGS: Multiple sternal wires are noted. The cardiac silhouette is borderline in size and stable in appearance. An artificial aortic valve is seen. Mild to moderate severity diffuse, chronic appearing increased interstitial lung markings are present. Mild atelectasis is seen within the left lung base. There is no evidence of a pleural effusion or pneumothorax. Multilevel degenerative changes are seen throughout the thoracic spine. IMPRESSION: 1. 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 2.  Chronic appearing increased interstitial lung markings with mild left basilar atelectasis. Electronically Signed   By: Aram Candela M.D.   On: 08/31/2022 00:23      Beckett Maden T. Seth Friedlander [facility]  If 7PM-7AM, please contact night-coverage [email or web address] 08/31/2022, 10:54 AM

## 2022-08-31 NOTE — H&P (Signed)
History and Physical     Patient: [name] ZOX:[id number] DOB: 1940/09/23 DOA: 08/30/2022 DOS: the patient was seen and examined on 08/31/2022 PCP: [name], MD   Patient coming from:  Home  Chief Complaint: AMS HISTORY OF PRESENT ILLNESS: [name] is an 82 y.o. female in ed. Patient presenting with progressive lethargy and somnolence and decreased p.o. intake. Son at bedside states that he has arrived and recently for the past few days has noticed the same. Chart review shows a note from oncology RN stating call documentation that since Tuesday when patient started morphine and Compazine and has been sleeping all the time and she wakes up she barely opens her eyes has not been drinking or eating very well and son Onalee Hua who is staying with her has to walk her to the restroom.  Yesterday he only gave her 1 dose of 30 mg of morphine because of this.  Per oncologist patient asked to cut back the morphine to 15 mg every day as needed.  If she has worsening symptoms or unable to arouse her or her breathing slows down to go to the emergency room.  Patient's son advised to get Narcan. In the emergency room discussed with nurse to discuss with son about trial of low-dose Narcan and monitor for any improvement in mental status.  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 by echo 10/2021   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/2015   Carotid artery stenosis    < 50% stenosis bilaterally by doppler 07/2016   Coronary artery disease 2008   Coronary Ca score of 331 with minimal multivessel plaque < 25% stenosis by coronary CTA 8/23   Heart murmur    per pt   Hypercholesteremia    Hypertension    Obesity    Osteopenia    declines treatment   Pneumonia  1995   Pulmonary HTN (HCC)    mild to moderate by echo 7/23 with PASP   Shoulder pain    Due to arthritis   Vitamin D insufficiency    Review of Systems  Unable to perform ROS: Mental status change   Allergies  Allergen Reactions   Crestor [Rosuvastatin Calcium] Other (See Comments)    Myalgias with 5mg  daily dose   Lactose Other (See Comments)   Lipitor [Atorvastatin] Other (See Comments)    Stomach pain   Pravastatin Other (See Comments)    Myalgias    Simvastatin Other (See Comments)    Myalgias    Tramadol Other (See Comments)    FEELS LOOPY   Zetia [Ezetimibe]     Myalgias   Codeine Palpitations   Penicillins Palpitations   Sulfa Antibiotics Palpitations   Vancomycin Itching and Other (See Comments)    Pt developed flushing and itchy scalp greater than into the infusion.  RedMan syndrone developed.  Will need slower admin rate for future infusions.   Past Surgical History:  Procedure Laterality Date   AORTIC VALVE REPLACEMENT N/A 10/14/2013   Procedure: AORTIC VALVE REPLACEMENT (AVR);  Surgeon: Alleen Borne, MD;  Location: [facility] OR;  Service: Open Heart Surgery;  Laterality: N/A;   CARDIAC CATHETERIZATION     CATARACT  EXTRACTION, BILATERAL     CHOLECYSTECTOMY  04/08/1988   INTRAOPERATIVE TRANSESOPHAGEAL ECHOCARDIOGRAM N/A 10/14/2013   Procedure: INTRAOPERATIVE TRANSESOPHAGEAL ECHOCARDIOGRAM;  Surgeon: Alleen Borne, MD;  Location: [facility] OR;  Service: Open Heart Surgery;  Laterality: N/A;   KYPHOPLASTY Bilateral 03/27/2022   Procedure: KYPHOPLASTY AND BIOPSY THORACIC ELEVEN;  Surgeon: Lisbeth Renshaw, MD;  Location: [facility] OR;  Service: Neurosurgery;  Laterality: Bilateral;   LEFT AND RIGHT HEART CATHETERIZATION WITH CORONARY ANGIOGRAM N/A 09/16/2013   Procedure: LEFT AND RIGHT HEART CATHETERIZATION WITH CORONARY ANGIOGRAM;  Surgeon: Quintella Reichert, MD;  Location: [facility] CATH LAB;  Service: Cardiovascular;  Laterality: N/A;   TONSILLECTOMY      MEDICATIONS: (Not in an outpatient encounter)    Current Facility-Administered Medications:    0.9 %  sodium chloride infusion, , Intravenous, Continuous, Allena Katz, Eliezer Mccoy, MD   acetaminophen (TYLENOL) tablet 650 mg, 650 mg, Oral, Q6H PRN, Irena Cords V, MD   heparin injection 5,000 Units, 5,000 Units, Subcutaneous, Q8H, Deion Swift V, MD   hydrALAZINE (APRESOLINE) injection 5 mg, 5 mg, Intravenous, Q4H PRN, Irena Cords V, MD   morphine (PF) 2 MG/ML injection 1 mg, 1 mg, Intravenous, Q4H PRN, Gertha Calkin, MD   naloxone [facility]) injection 0.2 mg, 0.2 mg, Intravenous, PRN, Irena Cords V, MD, 0.2 mg at 08/31/22 0430   sodium chloride flush (NS) 0.9 % injection 3 mL, 3 mL, Intravenous, Q12H, Gertha Calkin, MD  Current Outpatient Medications:    aspirin EC 81 MG tablet, Take 81 mg by mouth daily. Swallow whole., Disp: , Rfl:    hydrochlorothiazide (MICROZIDE) 12.5 MG capsule, Take 1 capsule (12.5 mg total) by mouth daily., Disp: 90 capsule, Rfl: 3   HYDROcodone-acetaminophen (NORCO/VICODIN) 5-325 MG tablet, Take 1 tablet by mouth every 6 (six) hours as needed for severe pain., Disp: 15 tablet, Rfl: 0   lisinopril (ZESTRIL) 10 MG tablet, Take 1 tablet by mouth once daily, Disp: 30 tablet, Rfl: 11   morphine (MS CONTIN) 15 MG 12 hr tablet, Take 2 tablets (30 mg total) by mouth every 12 (twelve) hours., Disp: 60 tablet, Rfl: 0   Multiple Vitamin (MULTIVITAMIN WITH MINERALS) TABS tablet, Take 1 tablet by mouth daily., Disp: , Rfl:    prochlorperazine (COMPAZINE) 10 MG tablet, Take 1 tablet (10 mg total) by mouth every 6 (six) hours as needed for nausea or vomiting., Disp: 30 tablet, Rfl: 0   allopurinol (ZYLOPRIM) 100 MG tablet, Take 1 tablet (100 mg total) by mouth 2 (two) times daily., Disp: 60 tablet, Rfl: 2   carboxymethylcellulose (REFRESH PLUS) 0.5 % SOLN, Place 1 drop into both eyes daily as needed (dry/irritated eyes)., Disp: , Rfl:    dexamethasone (DECADRON) 4 MG tablet, Please take 5  tablets (20 mg) weekly on the days of treatment, Disp: 40 tablet, Rfl: 3   lenalidomide (REVLIMID) 15 MG capsule, Take 1 capsule (15 mg total) by mouth daily. Take for 21 days, then hold for 7 days. Repeat every 28 days., Disp: 21 capsule, Rfl: 0    ED Course: Pt in Ed patient is in bed laying with her eyes closed opens up when spoken to. Vitals:   08/30/22 2313 08/31/22 0036 08/31/22 0301 08/31/22 0302  BP:  (!) 154/88 (!) 139/42   Pulse:  88 89   Resp:  14 14   Temp:    98.7 F (37.1 C)  TempSrc:    Oral  SpO2:  93% 94%   Weight: 64.3 kg  Height: 5\' 1"  (1.549 m)      Total I/O In: 1000 [IV Piggyback:1000] Out: -  SpO2: 94 % O2 Flow Rate (L/min): 2 L/min Blood work in ed shows: Hyponatremia of 127 chloride 89, creatinine of 2.73 EGFR of 17 calcium 10.5, AST of 55, ALT of 31, albumin less than 1.5. Troponin 40 6 repeat of 51, do not suspect NSTEMI however will repeat and follow. Leukocytosis of 13.5 hemoglobin 8.2 which is chronic and platelet counts of 315. EKG today is sinus rhythm 83 with PVCs and PACs, LVH, ST elevation in leads I to more consistent with early repolarization, QTc of 430. EKG is little different from her December 2023 EKG.  Patient is not having any complaints of chest pain shortness of breath chest pressure or any Other atypical presentations that are concerning for any cardiac involvement however we will repeat her troponins and follow.     Results for orders placed or performed during the hospital encounter of 08/30/22 (from the past 72 hour(s))  Basic metabolic panel     Status: Abnormal   Collection Time: 08/30/22 11:48 PM  Result Value Ref Range   Sodium 127 (L) 135 - 145 mmol/L   Potassium 3.5 3.5 - 5.1 mmol/L   Chloride 89 (L) 98 - 111 mmol/L   CO2 29 22 - 32 mmol/L   Glucose, Bld 89 70 - 99 mg/dL    Comment: Glucose reference range applies only to samples taken after fasting for at least 8 hours.   BUN 58 (H) 8 - 23 mg/dL   Creatinine, Ser  1.61 (H) 0.44 - 1.00 mg/dL   Calcium 09.6 (H) 8.9 - 10.3 mg/dL   GFR, Estimated 17 (L) >60 mL/min    Comment: (NOTE) Calculated using the CKD-EPI Creatinine Equation (2021)    Anion gap 9 5 - 15    Comment: Performed at [facility], [address], [place], [place] [address]  Troponin I (High Sensitivity)     Status: Abnormal   Collection Time: 08/30/22 11:48 PM  Result Value Ref Range   Troponin I (High Sensitivity) 46 (H) <18 ng/L    Comment: (NOTE) Elevated high sensitivity troponin I (hsTnI) values and significant  changes across serial measurements may suggest ACS but many other  chronic and acute conditions are known to elevate hsTnI results.  Refer to the "Links" section for chest pain algorithms and additional  guidance. Performed at [facility], [address], [place], [place] [address]   CBC with Differential     Status: Abnormal   Collection Time: 08/30/22 11:48 PM  Result Value Ref Range   WBC 13.5 (H) 4.0 - 10.5 K/uL   RBC 2.72 (L) 3.87 - 5.11 MIL/uL   Hemoglobin 8.2 (L) 12.0 - 15.0 g/dL   HCT 14.7 (L) 82.9 - 56.2 %   MCV 95.2 80.0 - 100.0 fL   MCH 30.1 26.0 - 34.0 pg   MCHC 31.7 30.0 - 36.0 g/dL   RDW 13.0 (H) 86.5 - 78.4 %   Platelets 315 150 - 400 K/uL   nRBC 0.0 0.0 - 0.2 %   Neutrophils Relative % 82 %   Neutro Abs 11.0 (H) 1.7 - 7.7 K/uL   Lymphocytes Relative 11 %   Lymphs Abs 1.5 0.7 - 4.0 K/uL   Monocytes Relative 6 %   Monocytes Absolute 0.8 0.1 - 1.0 K/uL   Eosinophils Relative 0 %   Eosinophils Absolute 0.0 0.0 - 0.5 K/uL  Basophils Relative 0 %   Basophils Absolute 0.0 0.0 - 0.1 K/uL   Immature Granulocytes 1 %   Abs Immature Granulocytes 0.11 (H) 0.00 - 0.07 K/uL    Comment: Performed at [facility], [address], [place], [place] [address]  D-dimer, quantitative     Status: Abnormal   Collection Time: 08/30/22 11:48 PM  Result Value Ref Range   D-Dimer, Quant 3.73 (H) 0.00 - 0.50 ug/mL-FEU    Comment: (NOTE) At  the manufacturer cut-off value of 0.5 g/mL FEU, this assay has a negative predictive value of 95-100%.This assay is intended for use in conjunction with a clinical pretest probability (PTP) assessment model to exclude pulmonary embolism (PE) and deep venous thrombosis (DVT) in outpatients suspected of PE or DVT. Results should be correlated with clinical presentation. Performed at [facility], [address], [place], [place] [address]   Hepatic function panel     Status: Abnormal   Collection Time: 08/31/22 12:31 AM  Result Value Ref Range   Total Protein 11.0 (H) 6.5 - 8.1 g/dL   Albumin <0.9 (L) 3.5 - 5.0 g/dL   AST 55 (H) 15 - 41 U/L   ALT 31 0 - 44 U/L   Alkaline Phosphatase 123 38 - 126 U/L   Total Bilirubin 0.9 0.3 - 1.2 mg/dL   Bilirubin, Direct 0.1 0.0 - 0.2 mg/dL   Indirect Bilirubin 0.8 0.3 - 0.9 mg/dL    Comment: Performed at [facility], [address], [place], [place] [address]  Urinalysis, w/ Reflex to Culture (Infection Suspected) -Urine, Clean Catch     Status: Abnormal   Collection Time: 08/31/22 12:45 AM  Result Value Ref Range   Specimen Source URINE, CLEAN CATCH    Color, Urine YELLOW YELLOW   APPearance HAZY (A) CLEAR   Specific Gravity, Urine 1.013 1.005 - 1.030   pH 5.0 5.0 - 8.0   Glucose, UA NEGATIVE NEGATIVE mg/dL   Hgb urine dipstick MODERATE (A) NEGATIVE   Bilirubin Urine NEGATIVE NEGATIVE   Ketones, ur 5 (A) NEGATIVE mg/dL   Protein, ur NEGATIVE NEGATIVE mg/dL   Nitrite NEGATIVE NEGATIVE   Leukocytes,Ua NEGATIVE NEGATIVE   RBC / HPF 0-5 0 - 5 RBC/hpf   WBC, UA 0-5 0 - 5 WBC/hpf    Comment:        Reflex urine culture not performed if WBC <=10, OR if Squamous epithelial cells >5. If Squamous epithelial cells >5 suggest recollection.    Bacteria, UA RARE (A) NONE SEEN   Squamous Epithelial / HPF 0-5 0 - 5 /HPF   Mucus PRESENT    Hyaline Casts, UA PRESENT     Comment: Performed at [facility], [address],  [place], [place] [address]  Troponin I (High Sensitivity)     Status: Abnormal   Collection Time: 08/31/22  1:40 AM  Result Value Ref Range   Troponin I (High Sensitivity) 51 (H) <18 ng/L    Comment: (NOTE) Elevated high sensitivity troponin I (hsTnI) values and significant  changes across serial measurements may suggest ACS but many other  chronic and acute conditions are known to elevate hsTnI results.  Refer to the "Links" section for chest pain algorithms and additional  guidance. Performed at [facility], [address], [place], [place] [address]     Lab Results  Component Value Date   CREATININE 2.73 (H) 08/30/2022   CREATININE 1.03 (H) 08/22/2022   CREATININE  1.18 (H) 08/06/2022      Latest Ref Rng & Units 08/31/2022   12:31 AM 08/30/2022   11:48 PM 08/22/2022   10:59 AM  CMP  Glucose 70 - 99 mg/dL  89  84   BUN 8 - 23 mg/dL  58  27   Creatinine 1.61 - 1.00 mg/dL  0.96  0.45   Sodium 409 - 145 mmol/L  127  130   Potassium 3.5 - 5.1 mmol/L  3.5  3.0   Chloride 98 - 111 mmol/L  89  97   CO2 22 - 32 mmol/L  29  30   Calcium 8.9 - 10.3 mg/dL  81.1  9.8   Total Protein 6.5 - 8.1 g/dL 91.4   78.2   Total Bilirubin 0.3 - 1.2 mg/dL 0.9   0.4   Alkaline Phos 38 - 126 U/L 123   153   AST 15 - 41 U/L 55   33   ALT 0 - 44 U/L 31   24     Unresulted Labs (From admission, onward)     Start     Ordered   08/31/22 0500  Magnesium  Tomorrow morning,   R        08/31/22 0439   08/31/22 0500  Phosphorus  Tomorrow morning,   R        08/31/22 0439   08/31/22 0440  CBC with Differential/Platelet  Once,   R        08/31/22 0439   08/31/22 0440  Comprehensive metabolic panel  Once,   R        08/31/22 0439   08/31/22 0440  T4, free  Once,   R        08/31/22 0439   08/31/22 0440  TSH  Once,   R        08/31/22 0439   08/31/22 0440  Type and screen [facility]  Once,   R       Comments: [facility]    08/31/22 0439           Pt has  received : Orders Placed This Encounter  Procedures   DG Chest 2 View    Standing Status:   Standing    Number of Occurrences:   1    Order Specific Question:   Reason for Exam (SYMPTOM  OR DIAGNOSIS REQUIRED)    Answer:   chest pain   CT Head Wo Contrast    Standing Status:   Standing    Number of Occurrences:   1   US RENAL    Standing Status:   Standing    Number of Occurrences:   1    Order Specific Question:   Symptom/Reason for Exam    Answer:   Acute kidney injury superimposed on chronic kidney disease (HCC) [[id number]]   Basic metabolic panel    Standing Status:   Standing    Number of Occurrences:   1   CBC with Differential    Standing Status:   Standing    Number of Occurrences:   1   D-dimer, quantitative    Standing Status:   Standing    Number of Occurrences:   1   Hepatic function panel    Standing Status:   Standing    Number of Occurrences:   1   Urinalysis, w/ Reflex to Culture (Infection Suspected) -Urine, Clean Catch    Standing Status:   Standing  Number of Occurrences:   1    Order Specific Question:   Specimen Source    Answer:   Urine, Clean Catch [76]   CBC with Differential/Platelet    Standing Status:   Standing    Number of Occurrences:   1   Comprehensive metabolic panel    Standing Status:   Standing    Number of Occurrences:   1   Magnesium    Standing Status:   Standing    Number of Occurrences:   1   Phosphorus    Standing Status:   Standing    Number of Occurrences:   1   T4, free    Standing Status:   Standing    Number of Occurrences:   1   TSH    Standing Status:   Standing    Number of Occurrences:   1   Diet NPO time specified    Standing Status:   Standing    Number of Occurrences:   1   In and Out Cath    Standing Status:   Standing    Number of Occurrences:   1   Maintain IV access    Standing Status:   Standing    Number of Occurrences:   1   Vital signs    Standing Status:   Standing    Number of Occurrences:    1   Notify physician (specify)    Standing Status:   Standing    Number of Occurrences:   20    Order Specific Question:   Notify Physician    Answer:   for pulse less than 55 or greater than 120    Order Specific Question:   Notify Physician    Answer:   for respiratory rate less than 12 or greater than 25    Order Specific Question:   Notify Physician    Answer:   for temperature greater than 100.5 F    Order Specific Question:   Notify Physician    Answer:   for urinary output less than 30 mL/hr for four hours    Order Specific Question:   Notify Physician    Answer:   for systolic BP less than 90 or greater than 160, diastolic BP less than 60 or greater than 100    Order Specific Question:   Notify Physician    Answer:   for new hypoxia w/ oxygen saturations < 88%   Progressive Mobility Protocol: No Restrictions    Standing Status:   Standing    Number of Occurrences:   1   Daily weights    Standing Status:   Standing    Number of Occurrences:   1   Intake and Output    Standing Status:   Standing    Number of Occurrences:   1   Do not place and if present remove PureWick    Standing Status:   Standing    Number of Occurrences:   1   Initiate Oral Care Protocol    Standing Status:   Standing    Number of Occurrences:   1   Initiate Carrier Fluid Protocol    Standing Status:   Standing    Number of Occurrences:   1   RN may order General Admission PRN Orders utilizing "General Admission PRN medications" (through manage orders) for the following patient needs: allergy symptoms (Claritin), cold sores (Carmex), cough (Robitussin DM), eye irritation (Liquifilm Tears), hemorrhoids (Tucks), indigestion (Maalox),  minor skin irritation (Hydrocortisone Cream), muscle pain Crista Elliot), nose irritation (saline nasal spray) and sore throat (Chloraseptic spray).    Standing Status:   Standing    Number of Occurrences:   [id number]   Cardiac Monitoring Continuous x 48 hours Indications for use:  Other; Other indications for use: Electrolyte abnormality/ AMS.    Standing Status:   Standing    Number of Occurrences:   1    Order Specific Question:   Indications for use:    Answer:   Other    Order Specific Question:   Other indications for use:    Answer:   Electrolyte abnormality/ AMS.   Swallow screen    Standing Status:   Standing    Number of Occurrences:   1   Full code    Standing Status:   Standing    Number of Occurrences:   1    Order Specific Question:   By:    Answer:   Other   Consult to hospitalist    Standing Status:   Standing    Number of Occurrences:   1    Order Specific Question:   Place call to:    Answer:   [facility] Question:   Reason for Consult    Answer:   Admit   Consult to hospitalist    Standing Status:   Standing    Number of Occurrences:   1    Order Specific Question:   Place call to:    Answer:   [facility] - no response from first consult request    Order Specific Question:   Reason for Consult    Answer:   Admit   Pulse oximetry check with vital signs    Standing Status:   Standing    Number of Occurrences:   1   Oxygen therapy Mode or (Route): Nasal cannula; Liters Per Minute: 2; Keep 02 saturation: greater than 92 %    Standing Status:   Standing    Number of Occurrences:   20    Order Specific Question:   Mode or (Route)    Answer:   Nasal cannula    Order Specific Question:   Liters Per Minute    Answer:   2    Order Specific Question:   Keep 02 saturation    Answer:   greater than 92 %   ED EKG    Standing Status:   Standing    Number of Occurrences:   1    Order Specific Question:   Reason for Exam    Answer:   Chest Pain   EKG 12-Lead    Standing Status:   Standing    Number of Occurrences:   1   Type and screen [facility]     Standing Status:   Standing    Number of Occurrences:   1   Saline lock IV    Standing Status:   Standing     Number of Occurrences:   1   Place in observation (patient's expected length of stay will be less than 2 midnights)    Standing Status:   Standing    Number of Occurrences:   1    Order Specific Question:   Hospital Area    Answer:   [facility] [[id number]]    Order Specific Question:   Level of Care  Answer:   Telemetry Medical [104]    Order Specific Question:   May place patient in observation at [facility] or [facility] if equivalent level of care is available:    Answer:   Yes    Order Specific Question:   Covid Evaluation    Answer:   Asymptomatic - no recent exposure (last 10 days) testing not required    Order Specific Question:   Diagnosis    Answer:   AMS (altered mental status) [[id number]]    Order Specific Question:   Admitting Physician    Answer:   Darrold Junker    Order Specific Question:   Attending Physician    Answer:   Darrold Junker   Aspiration precautions    Standing Status:   Standing    Number of Occurrences:   1   Fall precautions    Standing Status:   Standing    Number of Occurrences:   1    Meds ordered this encounter  Medications   DISCONTD: ipratropium-albuterol (DUONEB) 0.5-2.5 (3) MG/3ML nebulizer solution 3 mL   sodium chloride 0.9 % bolus 1,000 mL   lactated ringers bolus 1,000 mL   DISCONTD: naloxone (NARCAN) injection 0.1 mg   naloxone (NARCAN) injection 0.2 mg   morphine (PF) 2 MG/ML injection 1 mg   acetaminophen (TYLENOL) tablet 650 mg   heparin injection 5,000 Units   sodium chloride flush (NS) 0.9 % injection 3 mL   0.9 %  sodium chloride infusion   hydrALAZINE (APRESOLINE) injection 5 mg    Admission Imaging : CT Head Wo Contrast  Result Date: 08/31/2022 CLINICAL DATA:  Altered mental status. EXAM: CT HEAD WITHOUT CONTRAST TECHNIQUE: Contiguous axial images were obtained from the base of the skull through the vertex without intravenous contrast. RADIATION DOSE REDUCTION: This exam was performed  according to the departmental dose-optimization program which includes automated exposure control, adjustment of the mA and/or kV according to patient size and/or use of iterative reconstruction technique. COMPARISON:  May 15, 2020 FINDINGS: Brain: There is mild cerebral atrophy with widening of the extra-axial spaces and ventricular dilatation. There are areas of decreased attenuation within the white matter tracts of the supratentorial brain, consistent with microvascular disease changes. Vascular: No hyperdense vessel or unexpected calcification. Skull: Innumerable small lytic lesions are seen scattered throughout the skull. This represents a new finding when compared to the prior study. Sinuses/Orbits: No acute finding. Other: None. IMPRESSION: 1. No acute intracranial abnormality. 2. 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 3. Generalized cerebral atrophy with widening of the extra-axial spaces and ventricular dilatation. 4. 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 Electronically Signed   By: Aram Candela M.D.   On: 08/31/2022 01:22   DG Chest 2 View  Result Date: 08/31/2022 CLINICAL DATA:  Chest pain. EXAM: CHEST - 2 VIEW COMPARISON:  July 30, 2022 FINDINGS: Multiple sternal wires are noted. The cardiac silhouette is borderline in size and stable in appearance. An artificial aortic valve is seen. Mild to moderate severity diffuse, chronic appearing increased interstitial lung markings are present. Mild atelectasis is seen within the left lung base. There is no evidence of a pleural effusion or pneumothorax. Multilevel degenerative changes are seen throughout the thoracic spine. IMPRESSION: 1. 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 2. Chronic appearing increased interstitial lung markings with mild left basilar atelectasis. Electronically Signed   By: Waylan Rocher  [place] M.D.   On: 08/31/2022 00:23    Physical Examination: Vitals:   08/30/22 2313 08/31/22 0036 08/31/22 0301 08/31/22 0302  BP:  (!) 154/88 (!) 139/42   Pulse:  88 89   Temp:    98.7 F (37.1 C)  Resp:  14 14   Height: 5\' 1"  (1.549 m)     Weight: 64.3 kg     SpO2:  93% 94%   TempSrc:    Oral  BMI (Calculated): 26.8      Physical Exam Vitals and nursing note reviewed.  Constitutional:      General: She is not in acute distress. HENT:     Head: Normocephalic and atraumatic.     Right Ear: Hearing normal.     Left Ear: Hearing normal.     Nose: Nose normal. No nasal deformity.     Mouth/Throat:     Lips: Pink.     Tongue: No lesions.     Pharynx: Oropharynx is clear.  Eyes:     General: Lids are normal.     Extraocular Movements: Extraocular movements intact.  Cardiovascular:     Rate and Rhythm: Normal rate and regular rhythm.     Heart sounds: Normal heart sounds.  Pulmonary:     Effort: Pulmonary effort is normal.     Breath sounds: Examination of the right-lower field reveals wheezing. Examination of the left-lower field reveals wheezing. Wheezing present.  Abdominal:     General: There is no distension.     Palpations: Abdomen is soft.     Tenderness: There is no abdominal tenderness.  Musculoskeletal:     Right lower leg: No edema.     Left lower leg: No edema.  Skin:    General: Skin is warm.  Neurological:     General: No focal deficit present.     Mental Status: She is oriented to person, place, and time.     Cranial Nerves: Cranial nerves 2-12 are intact.  Psychiatric:        Speech: Speech normal.        Behavior: Behavior is withdrawn.     Assessment and Plan: * AMS (altered mental status) Patient presenting with progressive lethargy and somnolence and decreased p.o. intake. Son at bedside states that he has arrived and recently for the past few days has noticed the same. Will try Narcan 0.1 mg and repeat.  And monitor.. Continue with IV fluid  hydration pain control aspiration and fall precautions n.p.o. until bedside swallow and patient's mentation is baseline. With 0.1 mg Narcan patient had become more alert. We repeated 0.2 mg of Narcan and patient was alert awake oriented and requesting pain meds for her rib pain. Attribute patient's mental status changes secondary to pain medications that were recently initiated. Patient will need to much lower dose upon discharge.  Electrolyte abnormality We will  correct the calcium , continue MIVF with NS for hypercalcemia and hyponatremia.    Latest Ref Rng & Units 08/30/2022   11:48 PM 08/22/2022   10:59 AM 08/06/2022    1:30 PM  BMP  Glucose 70 - 99 mg/dL 89  84  409   BUN 8 - 23 mg/dL 58  27  29   Creatinine 0.44 - 1.00 mg/dL 8.11  9.14  7.82   Sodium 135 - 145 mmol/L 127  130  131   Potassium 3.5 - 5.1 mmol/L 3.5  3.0  3.2   Chloride 98 - 111 mmol/L 89  97  99   CO2 22 - 32 mmol/L 29  30  31    Calcium 8.9 - 10.3 mg/dL 29.5  9.8  28.4      Pathological fracture of thoracic vertebra Currently holding pain medications.  Will defer to day team to start patient on morphine short spurts with 2 mg every 4 hours as needed.  Suspect this is related to her new medications that are started in combination with the Compazine.  Acute renal failure superimposed on stage 3a chronic kidney disease (HCC) Lab Results  Component Value Date   CREATININE 2.73 (H) 08/30/2022   CREATININE 1.03 (H) 08/22/2022   CREATININE 1.18 (H) 08/06/2022  Patient developed AKI in the setting of mild CKD stage II/stage IIIa. Currently we will hold patient's  ACEI/ARB and other nephrotoxic agents. Continue with gentle continuous hydration. Avoid contrast studies. Bilateral renal ultrasound.   Essential hypertension, benign Vitals:   08/30/22 2311 08/31/22 0036 08/31/22 0301  BP: (!) 156/60 (!) 154/88 (!) 139/42  As needed hydralazine. Will currently hold HCTZ and lisinopril.  Multiple myeloma  [facility]) Patient has oncology appointment for starting chemotherapy regimen in the next coming few days. Inpatient consult as deemed appropriate although I do not suspect that she will needed.  Suspect her mental status changes secondary to overmedication and sedation combination of morphine and Compazine.  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 Currently being followed by oncology. IV PPI.  And type and screen    Hypercalcemia: Cont with NS at 75 cc/hour.,   Elevated troponin 2/2 to AKI on ckd.  Other orders -     Basic metabolic panel; Standing -     Troponin I (High Sensitivity); Standing -     CBC with Differential/Platelet; Standing -     D-dimer, quantitative; Standing -     Saline lock IV; Standing -     ED EKG; Standing -     DG Chest 2 View; Standing -     Hepatic function panel; Standing -     Urinalysis, w/ Reflex to Culture (Infection Suspected); Standing -     CT HEAD WO CONTRAST ( ); Standing -     In and Out Cath; Standing -     sodium chloride -     Consult to hospitalist; Standing -     lactated ringers -     Consult to hospitalist; Standing -     Naloxone HCl -     US RENAL; Standing -     Morphine Sulfate (PF) -     Acetaminophen    DVT prophylaxis:  Heparin.   Code Status:  Full code.       08/20/2022   10:00 AM  Advanced Directives  Type of Diplomatic Services operational officer;Living will   Family Communication:  Son Lollie Sails . Emergency Contact: Contact Information     Name Relation Home Work [place]) Son   [phone]   Nichoel, Coan [phone]  [phone]      Disposition Plan:  Home.  Consults: None. Admission status: Observation.   Unit / Expected LOS: Med tele / 1 day. Gertha Calkin MD [facility]  6 PM- 2 AM. [phone] Please use [email or web address] OR call [facility] Admits & Consults @ [phone] to contact current Assigned [facility] Attending/Consulting MD for  this patient.

## 2022-08-31 NOTE — Assessment & Plan Note (Signed)
Currently holding pain medications.  Will defer to day team to start patient on morphine short spurts with 2 mg every 4 hours as needed.  Suspect this is related to her new medications that are started in combination with the Compazine.

## 2022-08-31 NOTE — Progress Notes (Addendum)
ANTICOAGULATION CONSULT NOTE - Initial Consult  Pharmacy Consult for Heparin Indication: DVT  Allergies  Allergen Reactions   Crestor [Rosuvastatin Calcium] Other (See Comments)    Myalgias with 5mg  daily dose   Lactose Other (See Comments)   Lipitor [Atorvastatin] Other (See Comments)    Stomach pain   Pravastatin Other (See Comments)    Myalgias    Simvastatin Other (See Comments)    Myalgias    Tramadol Other (See Comments)    FEELS LOOPY   Zetia [Ezetimibe]     Myalgias   Codeine Palpitations   Penicillins Palpitations   Sulfa Antibiotics Palpitations   Vancomycin Itching and Other (See Comments)    Pt developed flushing and itchy scalp greater than into the infusion.  RedMan syndrone developed.  Will need slower admin rate for future infusions.    Patient Measurements: Height: 5\' 1"  (154.9 cm) Weight: 67.5 kg (148 lb 13 oz) IBW/kg (Calculated) : 47.8 Heparin dosing weight: 62.1 kg  Vital Signs: Temp: 98.3 F (36.8 C) (05/25 1220) Temp Source: Oral (05/25 1220) BP: 169/55 (05/25 1220) Pulse Rate: 75 (05/25 1220)  Labs: Recent Labs    08/30/22 2348 08/31/22 0140 08/31/22 0516 08/31/22 0654  HGB 8.2*  --  7.5*  --   HCT 25.9*  --  24.2*  --   PLT 315  --  299  --   CREATININE 2.73*  --  2.37*  --   TROPONINIHS 46* 51* 55* 78*    Estimated Creatinine Clearance: 15.5 mL/min (A) (by C-G formula based on SCr of 2.37 mg/dL (H)).   Medical History: 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 by echo 10/2021   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/2015   Carotid artery stenosis    < 50% stenosis bilaterally by doppler 07/2016   Coronary artery disease 2008   Coronary Ca score of 331 with minimal multivessel plaque <  25% stenosis by coronary CTA 8/23   Heart murmur    per pt   Hypercholesteremia    Hypertension    Obesity    Osteopenia    declines treatment   Pneumonia 1995   Pulmonary HTN (HCC)    mild to moderate by echo 7/23 with PASP   Shoulder pain    Due to arthritis   Vitamin D insufficiency     Medications:  Scheduled:   heparin  5,000 Units Subcutaneous Q8H   sodium chloride flush  3 mL Intravenous Q12H   Infusions:   sodium chloride 125 mL/hr at 08/31/22 1400   PRN: acetaminophen, hydrALAZINE, naLOXone (NARCAN)  injection  Assessment: 82 yo female with multiple myeloma who presents with altered mental status. Dopplers show acute DVT in LLE. Pharmacy consulted to dose IV heparin. Hgb consistently low over the past month. CrCl < 30 ml/min.  Goal of Therapy:  Heparin level 0.3 - 0.7 units/ml Monitor platelets by anticoagulation protocol: Yes   Plan:  Heparin 2500 units IV bolus Heparin infusion 1000 units/hr Check heparin level 8hrs after starting Daily heparin level and CBC  Loralee Pacas, PharmD, BCPS 08/31/2022,3:57 PM  Please check AMION for all [facility] Pharm[facility] phone numbers After 10:00 PM, call Main Pharmacy [phone]

## 2022-08-31 NOTE — ED Notes (Signed)
MD Alanda Slim notified and aware of lab value change.  First troponin 55; second 78.

## 2022-08-31 NOTE — Assessment & Plan Note (Signed)
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 Currently being followed by oncology. IV PPI.  And type and screen

## 2022-08-31 NOTE — Assessment & Plan Note (Signed)
Patient has oncology appointment for starting chemotherapy regimen in the next coming few days. Inpatient consult as deemed appropriate although I do not suspect that she will needed.  Suspect her mental status changes secondary to overmedication and sedation combination of morphine and Compazine.

## 2022-08-31 NOTE — Assessment & Plan Note (Signed)
We will  correct the calcium , continue MIVF with NS for hypercalcemia and hyponatremia.    Latest Ref Rng & Units 08/30/2022   11:48 PM 08/22/2022   10:59 AM 08/06/2022    1:30 PM  BMP  Glucose 70 - 99 mg/dL 89  84  952   BUN 8 - 23 mg/dL 58  27  29   Creatinine 0.44 - 1.00 mg/dL 8.41  3.24  4.01   Sodium 135 - 145 mmol/L 127  130  131   Potassium 3.5 - 5.1 mmol/L 3.5  3.0  3.2   Chloride 98 - 111 mmol/L 89  97  99   CO2 22 - 32 mmol/L 29  30  31    Calcium 8.9 - 10.3 mg/dL 02.7  9.8  25.3

## 2022-08-31 NOTE — Assessment & Plan Note (Signed)
Vitals:   08/30/22 2311 08/31/22 0036 08/31/22 0301  BP: (!) 156/60 (!) 154/88 (!) 139/42  As needed hydralazine. Will currently hold HCTZ and lisinopril.

## 2022-08-31 NOTE — Consult Note (Signed)
WOC Nurse Consult Note: Reason for Consult:Deep Tissue Pressure Injury (DTPI) to sacrum Wound type:pressure Pressure Injury POA: Yes Measurement:Per Bedside RN, C. Luck:  5cm x 6.5cm area of purple/maroon discoloration Wound bed:N/A Drainage (amount, consistency, odor) None Periwound:intact Dressing procedure/placement/frequency: A POC is implemented to reduce progression of DTPI, however typically, these skin injuries evolve to full thickness tissue loss.   Turning and repositioning with time in the supine position minimized is ordered as is topical care consisting of daily cleansing followed by covering with xeroform gauze and topping with silicone foam. Prevalon heel boots are provided bilaterally as is a chair cushion for pressure redistribution while the patient is OOB in the chair. It is to be sent with her at the time of discharge.  WOC nursing team will not follow, but will remain available to this patient, the nursing and medical teams.  Please re-consult if needed.  Thank you for inviting [place] to participate in this patient's Plan of Care.  Ladona Mow, MSN, RN, CNS, GNP, Leda Min, [employer], [employer] phone:  [phone]

## 2022-09-01 ENCOUNTER — Encounter (HOSPITAL_COMMUNITY): Payer: Self-pay | Admitting: Student

## 2022-09-01 DIAGNOSIS — D649 Anemia, unspecified: Secondary | ICD-10-CM

## 2022-09-01 DIAGNOSIS — N179 Acute kidney failure, unspecified: Secondary | ICD-10-CM

## 2022-09-01 DIAGNOSIS — R4182 Altered mental status, unspecified: Secondary | ICD-10-CM

## 2022-09-01 LAB — CBC WITH DIFFERENTIAL/PLATELET
Abs Immature Granulocytes: 0.08 10*3/uL — ABNORMAL HIGH (ref 0.00–0.07)
Basophils Absolute: 0 10*3/uL (ref 0.0–0.1)
Basophils Relative: 0 %
Eosinophils Absolute: 0 10*3/uL (ref 0.0–0.5)
Eosinophils Relative: 0 %
HCT: 21.2 % — ABNORMAL LOW (ref 36.0–46.0)
Hemoglobin: 6.7 g/dL — CL (ref 12.0–15.0)
Immature Granulocytes: 1 %
Lymphocytes Relative: 21 %
Lymphs Abs: 2.6 10*3/uL (ref 0.7–4.0)
MCH: 29.1 pg (ref 26.0–34.0)
MCHC: 31.6 g/dL (ref 30.0–36.0)
MCV: 92.2 fL (ref 80.0–100.0)
Monocytes Absolute: 0.9 10*3/uL (ref 0.1–1.0)
Monocytes Relative: 7 %
Neutro Abs: 8.9 10*3/uL — ABNORMAL HIGH (ref 1.7–7.7)
Neutrophils Relative %: 71 %
Platelets: 249 10*3/uL (ref 150–400)
RBC: 2.3 MIL/uL — ABNORMAL LOW (ref 3.87–5.11)
RDW: 15.9 % — ABNORMAL HIGH (ref 11.5–15.5)
WBC: 12.5 10*3/uL — ABNORMAL HIGH (ref 4.0–10.5)
nRBC: 0 % (ref 0.0–0.2)

## 2022-09-01 LAB — COMPREHENSIVE METABOLIC PANEL
ALT: 33 U/L (ref 0–44)
AST: 51 U/L — ABNORMAL HIGH (ref 15–41)
Albumin: <1.5 g/dL — ABNORMAL LOW (ref 3.5–5.0)
Alkaline Phosphatase: 105 U/L (ref 38–126)
Anion gap: 6 (ref 5–15)
BUN: 55 mg/dL — ABNORMAL HIGH (ref 8–23)
CO2: 25 mmol/L (ref 22–32)
Calcium: 9 mg/dL (ref 8.9–10.3)
Chloride: 101 mmol/L (ref 98–111)
Creatinine, Ser: 1.79 mg/dL — ABNORMAL HIGH (ref 0.44–1.00)
GFR, Estimated: 28 mL/min — ABNORMAL LOW (ref >60)
Glucose, Bld: 110 mg/dL — ABNORMAL HIGH (ref 70–99)
Potassium: 3 mmol/L — ABNORMAL LOW (ref 3.5–5.1)
Sodium: 132 mmol/L — ABNORMAL LOW (ref 135–145)
Total Bilirubin: 0.8 mg/dL (ref 0.3–1.2)
Total Protein: 9.1 g/dL — ABNORMAL HIGH (ref 6.5–8.1)

## 2022-09-01 LAB — TYPE AND SCREEN
Antibody Screen: NEGATIVE
Unit division: 0

## 2022-09-01 LAB — RETICULOCYTES
Immature Retic Fract: 26.7 % — ABNORMAL HIGH (ref 2.3–15.9)
RBC.: 2.3 MIL/uL — ABNORMAL LOW (ref 3.87–5.11)
Retic Count, Absolute: 57.5 10*3/uL (ref 19.0–186.0)
Retic Ct Pct: 2.5 % (ref 0.4–3.1)

## 2022-09-01 LAB — CK: Total CK: 119 U/L (ref 38–234)

## 2022-09-01 LAB — IRON AND TIBC: Iron: 83 ug/dL (ref 28–170)

## 2022-09-01 LAB — FERRITIN: Ferritin: 595 ng/mL — ABNORMAL HIGH (ref 11–307)

## 2022-09-01 LAB — PHOSPHORUS: Phosphorus: >30 mg/dL — ABNORMAL HIGH (ref 2.5–4.6)

## 2022-09-01 LAB — AMMONIA: Ammonia: 28 umol/L (ref 9–35)

## 2022-09-01 LAB — BPAM RBC
Blood Product Expiration Date: 202406242359
ISSUE DATE / TIME: 202405261104
Unit Type and Rh: 5100

## 2022-09-01 LAB — MAGNESIUM: Magnesium: 1.6 mg/dL — ABNORMAL LOW (ref 1.7–2.4)

## 2022-09-01 LAB — HEPARIN LEVEL (UNFRACTIONATED)
Heparin Unfractionated: 0.19 IU/mL — ABNORMAL LOW (ref 0.30–0.70)
Heparin Unfractionated: 0.39 IU/mL (ref 0.30–0.70)
Heparin Unfractionated: 0.47 IU/mL (ref 0.30–0.70)

## 2022-09-01 LAB — PREPARE RBC (CROSSMATCH)

## 2022-09-01 LAB — VITAMIN B12: Vitamin B-12: 977 pg/mL — ABNORMAL HIGH (ref 180–914)

## 2022-09-01 LAB — FOLATE: Folate: 5.2 ng/mL — ABNORMAL LOW (ref >5.9)

## 2022-09-01 LAB — RPR: RPR Ser Ql: NONREACTIVE

## 2022-09-01 MED ORDER — AMLODIPINE BESYLATE 5 MG PO TABS
5.0000 mg | ORAL_TABLET | Freq: Every day | ORAL | Status: DC
  Administered 2022-09-01 – 2022-09-03 (×3): 5 mg via ORAL
  Filled 2022-09-01 (×3): qty 1

## 2022-09-01 MED ORDER — ACETAMINOPHEN 325 MG PO TABS
650.0000 mg | ORAL_TABLET | Freq: Once | ORAL | Status: AC
  Administered 2022-09-01: 650 mg via ORAL
  Filled 2022-09-01: qty 2

## 2022-09-01 MED ORDER — LIDOCAINE 5 % EX PTCH
1.0000 | MEDICATED_PATCH | CUTANEOUS | Status: DC
  Administered 2022-09-01 – 2022-09-08 (×6): 1 via TRANSDERMAL
  Filled 2022-09-01 (×7): qty 1

## 2022-09-01 MED ORDER — POTASSIUM CHLORIDE CRYS ER 20 MEQ PO TBCR
40.0000 meq | EXTENDED_RELEASE_TABLET | Freq: Once | ORAL | Status: AC
  Administered 2022-09-01: 40 meq via ORAL
  Filled 2022-09-01: qty 2

## 2022-09-01 MED ORDER — SODIUM CHLORIDE 0.9% IV SOLUTION: Freq: Once | INTRAVENOUS | Status: AC

## 2022-09-01 MED ORDER — MAGNESIUM SULFATE 2 GM/50ML IV SOLN
2.0000 g | Freq: Once | INTRAVENOUS | Status: AC
  Administered 2022-09-01: 2 g via INTRAVENOUS
  Filled 2022-09-01: qty 50

## 2022-09-01 MED ORDER — HEPARIN BOLUS VIA INFUSION
2000.0000 [IU] | Freq: Once | INTRAVENOUS | Status: AC
  Administered 2022-09-01: 2000 [IU] via INTRAVENOUS
  Filled 2022-09-01: qty 2000

## 2022-09-01 MED ORDER — OXYCODONE HCL 5 MG PO TABS
5.0000 mg | ORAL_TABLET | Freq: Four times a day (QID) | ORAL | Status: DC | PRN
  Administered 2022-09-02 – 2022-09-04 (×4): 5 mg via ORAL
  Filled 2022-09-01 (×5): qty 1

## 2022-09-01 MED ORDER — DIPHENHYDRAMINE HCL 25 MG PO CAPS
25.0000 mg | ORAL_CAPSULE | Freq: Once | ORAL | Status: AC
  Administered 2022-09-01: 25 mg via ORAL
  Filled 2022-09-01: qty 1

## 2022-09-01 MED ORDER — ACETAMINOPHEN 500 MG PO TABS
1000.0000 mg | ORAL_TABLET | Freq: Three times a day (TID) | ORAL | Status: DC
  Administered 2022-09-01 – 2022-09-08 (×18): 1000 mg via ORAL
  Filled 2022-09-01 (×19): qty 2

## 2022-09-01 NOTE — Progress Notes (Signed)
ANTICOAGULATION CONSULT NOTE - Follow Up Consult  Pharmacy Consult for heparin Indication: DVT  Labs: Recent Labs    08/30/22 2348 08/31/22 0140 08/31/22 0516 08/31/22 0654 08/31/22 1431 09/01/22 0112  HGB 8.2*  --  7.5*  --   --  6.7*  HCT 25.9*  --  24.2*  --   --  21.2*  PLT 315  --  299  --   --  249  HEPARINUNFRC  --   --   --   --   --  0.19*  CREATININE 2.73*  --  2.37*  --  2.08*  --   TROPONINIHS 46* 51* 55* 78*  --   --     Assessment: 82yo female subtherapeutic on heparin with initial dosing for DVT; no infusion issues or signs of bleeding per RN.  Goal of Therapy:  Heparin level 0.3-0.7 units/ml   Plan:  2000 units heparin bolus. Increase heparin infusion by 3 units/kg/hr to 1200 units/hr. Check level in 8 hours.   Vernard Gambles, PharmD, BCPS 09/01/2022 2:24 AM

## 2022-09-01 NOTE — Evaluation (Signed)
Physical Therapy Evaluation Patient Details Name: [name] MRN: [id number] DOB: 03-19-1941 Today's Date: 09/01/2022  History of Present Illness  82 year old F presenting to ED with progressive altered mental status, lethargy and poor p.o. intake and admitted for acute toxic metabolic encephalopathy likely due to pain medication and Compazine as well as AKI with azotemia and hypercalcemia.with PMH of multiple myeloma on chemo, AVR, and HTN.   Clinical Impression  [name] is 82 y.o. female admitted with above HPI and diagnosis. Patient is currently limited by functional impairments below (see PT problem list). Patient lives alone and is mod independent with RW at baseline and has assist from family as needed. Family plans to provide/arrange 24/7 care for pt following return home. Currently she requires mod assist for bed mobility and min assist for transfers with RW. Pt initiated gait with RW and amb ~6' with min assist and close chair follow. Patient will benefit from continued skilled PT interventions to address impairments and progress independence with mobility. Acute PT will follow and progress as able.        Recommendations for follow up therapy are one component of a multi-disciplinary discharge planning process, led by the attending physician.  Recommendations may be updated based on patient status, additional functional criteria and insurance authorization.  Follow Up Recommendations       Assistance Recommended at Discharge Frequent or constant Supervision/Assistance  Patient can return home with the following  A lot of help with walking and/or transfers;A lot of help with bathing/dressing/bathroom;Assistance with cooking/housework;Direct supervision/assist for medications management;Assist for transportation;Help with stairs or ramp for entrance    Equipment Recommendations    Recommendations for Other Services       Functional Status Assessment Patient has had a recent  decline in their functional status and demonstrates the ability to make significant improvements in function in a reasonable and predictable amount of time.     Precautions / Restrictions Precautions Precautions: Fall Restrictions Weight Bearing Restrictions: No      Mobility  Bed Mobility Overal bed mobility: Needs Assistance Bed Mobility: Supine to Sit     Supine to sit: HOB elevated, Mod assist     General bed mobility comments: cues for use of bed rail and use of bed pad to pivot hips with mod assist to bring LE's off EOB and raise trunk.    Transfers Overall transfer level: Needs assistance Equipment used: 1 person hand held assist, Rolling walker (2 wheels) Transfers: Sit to/from Stand, Bed to chair/wheelchair/BSC Sit to Stand: Min assist   Step pivot transfers: Min assist       General transfer comment: Min assist to rise from EOB and guide steps/turn bed>chair with face-to-face HHA for stand step transfer. Min assit for sit<>stand from recliner to RW, pt with posterior lean but decreased with RW.    Ambulation/Gait Ambulation/Gait assistance: Min assist, +2 safety/equipment Gait Distance (Feet): 6 Feet Assistive device: Rolling walker (2 wheels) Gait Pattern/deviations: Decreased step length - right, Step-through pattern, Decreased step length - left, Decreased stride length, Leaning posteriorly, Trunk flexed, Shuffle Gait velocity: decr     General Gait Details: Assist for walker management and cues to advance bil LE's into RW for forward gait. limited due to fatigue. close chair follow for safety.  Stairs            Wheelchair Mobility    Modified Rankin (Stroke Patients Only)       Balance Overall balance assessment: Needs assistance Sitting-balance support: Feet  supported Sitting balance-Leahy Scale: Fair   Postural control: Posterior lean Standing balance support: During functional activity Standing balance-Leahy Scale: Poor                                Pertinent Vitals/Pain Pain Assessment Pain Assessment: Faces Faces Pain Scale: Hurts little more Pain Location: ribs and legs Pain Descriptors / Indicators: Aching, Discomfort, Grimacing Pain Intervention(s): Limited activity within patient's tolerance, Monitored during session, Repositioned    Home Living Family/patient expects to be discharged to:: Private residence Living Arrangements: Children Available Help at Discharge: Family (family will have to arrange 24 hr) Type of Home: House Home Access: Ramped entrance       Home Layout: One level Home Equipment: Agricultural consultant (2 wheels);Cane - single point      Prior Function Prior Level of Function : Independent/Modified Independent             Mobility Comments: used walker in the house ADLs Comments: pt and son report she was independent before this medical episode     Hand Dominance   Dominant Hand: Right    Extremity/Trunk Assessment   Upper Extremity Assessment Upper Extremity Assessment: Defer to OT evaluation    Lower Extremity Assessment Lower Extremity Assessment: Generalized weakness    Cervical / Trunk Assessment Cervical / Trunk Assessment: Kyphotic  Communication   Communication: No difficulties  Cognition Arousal/Alertness: Awake/alert, Lethargic Behavior During Therapy: WFL for tasks assessed/performed Overall Cognitive Status: Impaired/Different from baseline                                 General Comments: pt pleasant and alert at start of visit smiling occasionally. as pt fatigued at EOS pt more lethargic and kept eyes closed, slower with responses. Pt requried cuing for orientation but son "[name]" present and reports each day she is a little more aware and alert.        General Comments      Exercises     Assessment/Plan    PT Assessment Patient needs continued PT services  PT Problem List Decreased strength;Decreased activity  tolerance;Decreased balance;Decreased mobility;Decreased cognition;Decreased knowledge of use of DME;Decreased safety awareness;Decreased knowledge of precautions;Cardiopulmonary status limiting activity;Obesity;Pain;Decreased skin integrity       PT Treatment Interventions DME instruction;Gait training;Stair training;Functional mobility training;Therapeutic activities;Therapeutic exercise;Balance training;Neuromuscular re-education;Cognitive remediation;Patient/family education;Wheelchair mobility training    PT Goals (Current goals can be found in the Care Plan section)  Acute Rehab PT Goals Patient Stated Goal: get stronger and return home PT Goal Formulation: With patient/family Time For Goal Achievement: 09/15/22 Potential to Achieve Goals: Good    Frequency Min 3X/week     Co-evaluation               AM-PAC PT "6 Clicks" Mobility  Outcome Measure Help needed turning from your back to your side while in a flat bed without using bedrails?: A Little Help needed moving from lying on your back to sitting on the side of a flat bed without using bedrails?: A Lot Help needed moving to and from a bed to a chair (including a wheelchair)?: A Little Help needed standing up from a chair using your arms (e.g., wheelchair or bedside chair)?: A Little Help needed to walk in hospital room?: A Little Help needed climbing 3-5 steps with a railing? : Total 6 Click Score: 15  End of Session Equipment Utilized During Treatment: Gait belt Activity Tolerance: Patient tolerated treatment well Patient left: in chair;with call bell/phone within reach;with family/visitor present Nurse Communication: Mobility status PT Visit Diagnosis: Unsteadiness on feet (R26.81);Muscle weakness (generalized) (M62.81);Difficulty in walking, not elsewhere classified (R26.2);Pain Pain - part of body:  (buttock)    Time: 1610-9604 PT Time Calculation (min) (ACUTE ONLY): 33 min   Charges:   PT  Evaluation $PT Eval Moderate Complexity: 1 Mod PT Treatments $Therapeutic Activity: 8-22 mins       Wynn Maudlin, DPT Acute Rehabilitation Services Office [phone]  09/01/22 3:51 PM

## 2022-09-01 NOTE — Progress Notes (Signed)
ANTICOAGULATION CONSULT NOTE - Follow Up Consult  Pharmacy Consult for heparin Indication: DVT  Allergies  Allergen Reactions   Crestor [Rosuvastatin Calcium] Other (See Comments)    Myalgias with 5mg  daily dose   Lactose Other (See Comments)   Lipitor [Atorvastatin] Other (See Comments)    Stomach pain   Pravastatin Other (See Comments)    Myalgias    Simvastatin Other (See Comments)    Myalgias    Tramadol Other (See Comments)    FEELS LOOPY   Zetia [Ezetimibe]     Myalgias   Codeine Palpitations   Penicillins Palpitations   Sulfa Antibiotics Palpitations   Vancomycin Itching and Other (See Comments)    Pt developed flushing and itchy scalp greater than into the infusion.  RedMan syndrone developed.  Will need slower admin rate for future infusions.   Patient Measurements: Height: 5\' 1"  (154.9 cm) Weight: 67.5 kg (148 lb 13 oz) IBW/kg (Calculated) : 47.8 kg Heparin Dosing Weight: 62.1 kg  Vital Signs: Temp: 97.8 F (36.6 C) (05/26 1049) Temp Source: Axillary (05/26 1049) BP: 113/41 (05/26 1049) Pulse Rate: 71 (05/26 1049)  Labs: Recent Labs    08/30/22 2348 08/31/22 0140 08/31/22 0516 08/31/22 0654 08/31/22 1431 09/01/22 0112 09/01/22 1004  HGB 8.2*  --  7.5*  --   --  6.7*  --   HCT 25.9*  --  24.2*  --   --  21.2*  --   PLT 315  --  299  --   --  249  --   HEPARINUNFRC  --   --   --   --   --  0.19* 0.39  CREATININE 2.73*  --  2.37*  --  2.08* 1.79*  --   CKTOTAL  --   --   --   --   --  119  --   TROPONINIHS 46* 51* 55* 78*  --   --   --    Estimated Creatinine Clearance: 20.6 mL/min (A) (by C-G formula based on SCr of 1.79 mg/dL (H)).  Medications:  Infusions:   heparin 1,200 Units/hr (09/01/22 0234)   Assessment: 82 yo female with multiple myeloma who presents with altered mental status. Dopplers show acute DVT in LLE.  Heparin level today is therapeutic at 0.39, on 1200 units/hr. Hgb 6.7 and 1 unit PRBC ordered. No evidence of  hematochezia or melena.   Goal of Therapy:  Heparin level 0.3-0.7 units/ml Monitor platelets by anticoagulation protocol: Yes   Plan:  Continue heparin 1200 units/hr. Check 8 heparin level.  Daily CBC, heparin level. Monitor for signs/symptoms of bleeding.   Lisabeth Pick Sue [place]cal[place]ander 09/01/2022,11:21 AM

## 2022-09-01 NOTE — Progress Notes (Signed)
ANTICOAGULATION CONSULT NOTE - Follow Up Consult  Pharmacy Consult for heparin Indication: DVT  Allergies  Allergen Reactions   Crestor [Rosuvastatin Calcium] Other (See Comments)    Myalgias with 5mg  daily dose   Lactose Other (See Comments)   Lipitor [Atorvastatin] Other (See Comments)    Stomach pain   Pravastatin Other (See Comments)    Myalgias    Simvastatin Other (See Comments)    Myalgias    Tramadol Other (See Comments)    FEELS LOOPY   Zetia [Ezetimibe]     Myalgias   Codeine Palpitations   Penicillins Palpitations   Sulfa Antibiotics Palpitations   Vancomycin Itching and Other (See Comments)    Pt developed flushing and itchy scalp greater than into the infusion.  RedMan syndrone developed.  Will need slower admin rate for future infusions.   Patient Measurements: Height: 5\' 1"  (154.9 cm) Weight: 67.5 kg (148 lb 13 oz) IBW/kg (Calculated) : 47.8 kg Heparin Dosing Weight: 62.1 kg  Vital Signs: Temp: 97.9 F (36.6 C) (05/26 1400) Temp Source: Oral (05/26 1400) BP: 135/56 (05/26 1400) Pulse Rate: 73 (05/26 1400)  Labs: Recent Labs    08/30/22 2348 08/31/22 0140 08/31/22 0516 08/31/22 0654 08/31/22 1431 09/01/22 0112 09/01/22 1004 09/01/22 1742  HGB 8.2*  --  7.5*  --   --  6.7*  --   --   HCT 25.9*  --  24.2*  --   --  21.2*  --   --   PLT 315  --  299  --   --  249  --   --   HEPARINUNFRC  --   --   --   --   --  0.19* 0.39 0.47  CREATININE 2.73*  --  2.37*  --  2.08* 1.79*  --   --   CKTOTAL  --   --   --   --   --  119  --   --   TROPONINIHS 46* 51* 55* 78*  --   --   --   --     Estimated Creatinine Clearance: 20.6 mL/min (A) (by C-G formula based on SCr of 1.79 mg/dL (H)).  Medications:  Infusions:   heparin 1,200 Units/hr (09/01/22 1619)   Assessment: 82 yo female with multiple myeloma who presents with altered mental status. Dopplers show acute DVT in LLE.    Confirmatory heparin level is therapeutic at 0.47, on 1200 units/hr.  No bleeding or issues with infusion reported.  Goal of Therapy:  Heparin level 0.3-0.7 units/ml Monitor platelets by anticoagulation protocol: Yes   Plan:  Continue heparin 1200 units/hr. Daily CBC, heparin level. Monitor for signs/symptoms of bleeding.   Loralee Pacas, PharmD, BCPS 09/01/2022,6:48 PM  Please check AMION for all [facility] phone numbers After 10:00 PM, call Main Pharmacy [phone]

## 2022-09-01 NOTE — Progress Notes (Signed)
PROGRESS NOTE        PATIENT DETAILS Name: [name] Age: 82 y.o. Sex: female Date of Birth: 25-Jul-1940 Admit Date: 08/30/2022 Admitting Physician Almon Hercules, MD ZOX:WRUE, Dorris Singh, MD  Brief Summary: Patient is a 82 y.o.  female history of multiple myeloma, HTN-who presented with encephalopathy in the setting of narcotic use/AKI.  See below for further details.  Significant events: 5/24>> admit to [facility]  Significant studies: 5/25>> CXR: Chronic interstitial lung markings. 5/25>> CT head: No acute intracranial abnormality. 5/25>> renal ultrasound: No hydronephrosis 5/25>> x-ray pelvis: No acute osseous abnormality 5/25>> bilateral lower extremity Doppler: Left leg DVT involving posterior tibial/peroneal vein.  Significant microbiology data: None  Procedures: None  Consults: None  Subjective: Lying comfortably in bed-denies any chest pain or shortness of breath.  Easily awakes-answers questions appropriately-2 sons at bedside.  Both report significant improvement.  Objective: Vitals: Blood pressure (!) 131/46, pulse 68, temperature 99.1 F (37.3 C), temperature source Oral, resp. rate (!) 23, height 5\' 1"  (1.549 m), weight 67.5 kg, SpO2 94 %.   Exam: Gen Exam:Alert awake-not in any distress HEENT:atraumatic, normocephalic Chest: B/L clear to auscultation anteriorly CVS:S1S2 regular Abdomen:soft non tender, non distended Extremities:no edema Neurology: Non focal Skin: no rash  Pertinent Labs/Radiology:    Latest Ref Rng & Units 09/01/2022    1:12 AM 08/31/2022    5:16 AM 08/30/2022   11:48 PM  CBC  WBC 4.0 - 10.5 K/uL 12.5  17.8  13.5   Hemoglobin 12.0 - 15.0 g/dL 6.7  7.5  8.2   Hematocrit 36.0 - 46.0 % 21.2  24.2  25.9   Platelets 150 - 400 K/uL 249  299  315     Lab Results  Component Value Date   NA 132 (L) 09/01/2022   K 3.0 (L) 09/01/2022   CL 101 09/01/2022   CO2 25 09/01/2022      Assessment/Plan: Acute toxic  metabolic encephalopathy Secondary to a long-acting morphine use (recently started)/AKI Encephalopathy has improved-patient significantly better and close to baseline per family at bedside.  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  HTN BP increasing Start low-dose amlodipine Continue to hold lisinopril/HCTZ given resolving AKI.  Debility/deconditioning PT/OT  Deep tissue pressure injury to sacrum Present prior to hospitalization Appreciate wound care input  Pressure Injury 08/31/22 Sacrum Mid Deep Tissue Pressure Injury - Purple or maroon localized area of discolored intact skin or blood-filled blister due to damage of underlying soft tissue from pressure and/or shear. marron, nonblancable (Active)  08/31/22 1300  Location: Sacrum  Location Orientation: Mid  Staging: Deep Tissue Pressure Injury - Purple or maroon localized area of discolored intact skin or blood-filled blister due to damage of underlying soft tissue from pressure and/or shear.  Wound Description (Comments): marron, nonblancable  Present on Admission: Yes     BMI: Estimated body mass index is 28.12 kg/m as calculated from the following:   Height as of this encounter: 5'  1" (1.549 m).   Weight as of this encounter: 67.5 kg.   Code status:   Code Status: Full Code   DVT Prophylaxis: IV heparin   Family Communication: 2 sons at bedside.   Disposition Plan: Status is: Inpatient Remains inpatient appropriate because: Severity of illness   Planned Discharge Destination:Home health   Diet: Diet Order             Diet renal/carb modified with fluid restriction Diet-HS Snack? Nothing; Fluid restriction: 2000 mL Fluid; Room service appropriate? Yes; Fluid consistency: Thin  Diet effective now                     Antimicrobial agents: Anti-infectives (From admission, onward)    None        MEDICATIONS: Scheduled Meds:  sevelamer carbonate  2.4 g Oral TID WC   sodium chloride flush  3 mL Intravenous Q12H   Continuous Infusions:  heparin 1,200 Units/hr (09/01/22 0234)   PRN Meds:.acetaminophen, hydrALAZINE, naLOXone (NARCAN)  injection   I have personally reviewed following labs and imaging studies  LABORATORY DATA: CBC: Recent Labs  Lab 08/30/22 2348 08/31/22 0516 09/01/22 0112  WBC 13.5* 17.8* 12.5*  NEUTROABS 11.0* 13.5* 8.9*  HGB 8.2* 7.5* 6.7*  HCT 25.9* 24.2* 21.2*  MCV 95.2 94.5 92.2  PLT 315 299 249    Basic Metabolic Panel: Recent Labs  Lab 08/30/22 2348 08/31/22 0516 08/31/22 1431 09/01/22 0112  NA 127* 128* 130* 132*  K 3.5 3.6 3.2* 3.0*  CL 89* 95* 98 101  CO2 29 22 27 25   GLUCOSE 89 106* 115* 110*  BUN 58* 57* 55* 55*  CREATININE 2.73* 2.37* 2.08* 1.79*  CALCIUM 10.5* 9.5 9.5 9.0  MG  --  1.8  --  1.6*  PHOS  --  >30.0* >30.0* >30.0*    GFR: Estimated Creatinine Clearance: 20.6 mL/min (A) (by C-G formula based on SCr of 1.79 mg/dL (H)).  Liver Function Tests: Recent Labs  Lab 08/31/22 0031 08/31/22 0516 08/31/22 1431 09/01/22 0112  AST 55* 73*  --  51*  ALT 31 35  --  33  ALKPHOS 123 114  --  105  BILITOT 0.9 1.4*  --  0.8  PROT 11.0* 10.3*  --  9.1*  ALBUMIN <1.5* <1.5* <1.5*  <1.5*   No results for input(s): "LIPASE", "AMYLASE" in the last 168 hours. Recent Labs  Lab 08/31/22 1431 09/01/22 0112  AMMONIA 20 28    Coagulation Profile: No results for input(s): "INR", "PROTIME" in the last 168 hours.  Cardiac Enzymes: Recent Labs  Lab 09/01/22 0112  CKTOTAL 119    BNP (last 3 results) No results for input(s): "PROBNP" in the last 8760 hours.  Lipid Profile: No results for input(s): "CHOL", "HDL", "LDLCALC", "TRIG", "CHOLHDL", "LDLDIRECT" in the last 72 hours.  Thyroid Function Tests: Recent Labs    08/31/22 0514 08/31/22 0516  TSH 0.767  --   FREET4  --  1.09    Anemia Panel: Recent Labs    08/31/22 1431 09/01/22 0112  VITAMINB12 840 977*  FOLATE  --  5.2*  FERRITIN  --  595*  TIBC  --  NOT CALCULATED  IRON  --  83  RETICCTPCT  --  2.5    Urine analysis:    Component Value  Date/Time   COLORURINE YELLOW 08/31/2022 0045   APPEARANCEUR HAZY (A) 08/31/2022 0045   LABSPEC 1.013 08/31/2022 0045   PHURINE 5.0 08/31/2022 0045   GLUCOSEU NEGATIVE 08/31/2022 0045   HGBUR MODERATE (A) 08/31/2022 0045   BILIRUBINUR NEGATIVE 08/31/2022 0045   KETONESUR 5 (A) 08/31/2022 0045   PROTEINUR NEGATIVE 08/31/2022 0045   UROBILINOGEN 0.2 10/12/2013 1117   NITRITE NEGATIVE 08/31/2022 0045   LEUKOCYTESUR NEGATIVE 08/31/2022 0045    Sepsis Labs: Lactic Acid, Venous No results found for: "LATICACIDVEN"  MICROBIOLOGY: No results found for this or any previous visit (from the past 240 hour(s)).  RADIOLOGY STUDIES/RESULTS: VAS [place] LOWER EXTREMITY VENOUS (DVT)  Result Date: 08/31/2022  Lower Venous DVT Study Patient Name:  SWATI RINIKER  Date of Exam:   08/31/2022 Medical Rec #: [id number]     Accession #:    [id number] Date of Birth: [birth year]     Patient Gender: F Patient Age:   71 years Exam Location:  [facility] Procedure:      VAS [place] LOWER EXTREMITY VENOUS (DVT) Referring Phys: Alwyn Ren GONFA  --------------------------------------------------------------------------------  Indications: Bilateral leg swelling.  Risk Factors: Cancer - multiple myeloma. Comparison Study: No prior studies. Performing Technologist: Jean Rosenthal RDMS, RVT  Examination Guidelines: A complete evaluation includes B-mode imaging, spectral Doppler, color Doppler, and power Doppler as needed of all accessible portions of each vessel. Bilateral testing is considered an integral part of a complete examination. Limited examinations for reoccurring indications may be performed as noted. The reflux portion of the exam is performed with the patient in reverse Trendelenburg.  +---------+---------------+---------+-----------+----------+--------------+ RIGHT    CompressibilityPhasicitySpontaneityPropertiesThrombus Aging +---------+---------------+---------+-----------+----------+--------------+ CFV      Full           Yes      Yes                                 +---------+---------------+---------+-----------+----------+--------------+ SFJ      Full                                                        +---------+---------------+---------+-----------+----------+--------------+ FV Prox  Full                                                        +---------+---------------+---------+-----------+----------+--------------+ FV Mid   Full                                                        +---------+---------------+---------+-----------+----------+--------------+ FV DistalFull                                                        +---------+---------------+---------+-----------+----------+--------------+ PFV      Full                                                        +---------+---------------+---------+-----------+----------+--------------+  POP      Full           Yes      Yes                                  +---------+---------------+---------+-----------+----------+--------------+ PTV      Full                                                        +---------+---------------+---------+-----------+----------+--------------+ PERO     Full                                                        +---------+---------------+---------+-----------+----------+--------------+   +---------+---------------+---------+-----------+----------+--------------+ LEFT     CompressibilityPhasicitySpontaneityPropertiesThrombus Aging +---------+---------------+---------+-----------+----------+--------------+ CFV      Full           Yes      Yes                                 +---------+---------------+---------+-----------+----------+--------------+ SFJ      Full                                                        +---------+---------------+---------+-----------+----------+--------------+ FV Prox  Full                                                        +---------+---------------+---------+-----------+----------+--------------+ FV Mid   Full                                                        +---------+---------------+---------+-----------+----------+--------------+ FV DistalFull                                                        +---------+---------------+---------+-----------+----------+--------------+ PFV      Full                                                        +---------+---------------+---------+-----------+----------+--------------+ POP      Full                                                        +---------+---------------+---------+-----------+----------+--------------+  PTV      Partial        Yes      Yes                  Acute          +---------+---------------+---------+-----------+----------+--------------+ PERO     None           No       No                   Acute           +---------+---------------+---------+-----------+----------+--------------+     Summary: RIGHT: - There is no evidence of deep vein thrombosis in the lower extremity.  - No cystic structure found in the popliteal fossa.  LEFT: - Findings consistent with acute deep vein thrombosis involving the left posterior tibial veins, and left peroneal veins. - A cystic structure is found in the popliteal fossa.  *See table(s) above for measurements and observations. Electronically signed by Waverly Ferrari MD on 08/31/2022 at 7:59:15 PM.    Final    DG Pelvis 1-2 Views  Result Date: 08/31/2022 CLINICAL DATA:  Sacral wound.  History of multiple myeloma. EXAM: PELVIS - 1-2 VIEW COMPARISON:  PET-CT 08/22/2022 FINDINGS: No acute fracture or pelvic diastasis is identified. The lower sacrum and coccyx are poorly evaluated due to overlying stool. Degenerative changes are noted in the lower lumbar spine, and there are mild degenerative changes at the hips. Small lucencies consistent with known multiple myeloma are most conspicuous in the pubic rami. IMPRESSION: 1. No acute osseous abnormality identified. 2. Known multiple myeloma. Electronically Signed   By: Sebastian Ache M.D.   On: 08/31/2022 11:26   US RENAL  Result Date: 08/31/2022 CLINICAL DATA:  Acute kidney injury EXAM: RENAL / URINARY TRACT ULTRASOUND COMPLETE COMPARISON:  07/30/2022 FINDINGS: Right Kidney: Renal measurements: 10.5 x 4.7 x 5 cm = volume: 130 mL. Echogenicity within normal limits. No mass or hydronephrosis visualized. Left Kidney: Renal measurements: 10.2 x 6 x 5.2 cm = volume: 170 mL. Echogenicity within normal limits. No mass or hydronephrosis visualized. Bladder: Appears normal for degree of bladder distention. Other: Echogenic liver consistent with steatosis. 3.5 cm pelvic cyst which is left-sided, ovarian, and underestimated when compared to recent PET CT at which time recommendations were provided for follow-up. IMPRESSION: Normal ultrasound of  the kidneys and bladder. Electronically Signed   By: Tiburcio Pea M.D.   On: 08/31/2022 06:33   CT Head Wo Contrast  Result Date: 08/31/2022 CLINICAL DATA:  Altered mental status. EXAM: CT HEAD WITHOUT CONTRAST TECHNIQUE: Contiguous axial images were obtained from the base of the skull through the vertex without intravenous contrast. RADIATION DOSE REDUCTION: This exam was performed according to the departmental dose-optimization program which includes automated exposure control, adjustment of the mA and/or kV according to patient size and/or use of iterative reconstruction technique. COMPARISON:  May 15, 2020 FINDINGS: Brain: There is mild cerebral atrophy with widening of the extra-axial spaces and ventricular dilatation. There are areas of decreased attenuation within the white matter tracts of the supratentorial brain, consistent with microvascular disease changes. Vascular: No hyperdense vessel or unexpected calcification. Skull: Innumerable small lytic lesions are seen scattered throughout the skull. This represents a new finding when compared to the prior study. Sinuses/Orbits: No acute finding. Other: None. IMPRESSION: 1. No acute intracranial abnormality. 2. 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 3. Generalized cerebral  atrophy with widening of the extra-axial spaces and ventricular dilatation. 4. 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 Electronically Signed   By: Aram Candela M.D.   On: 08/31/2022 01:22   DG Chest 2 View  Result Date: 08/31/2022 CLINICAL DATA:  Chest pain. EXAM: CHEST - 2 VIEW COMPARISON:  July 30, 2022 FINDINGS: Multiple sternal wires are noted. The cardiac silhouette is borderline in size and stable in appearance. An artificial aortic valve is seen. Mild to moderate severity diffuse, chronic appearing increased interstitial lung markings are  present. Mild atelectasis is seen within the left lung base. There is no evidence of a pleural effusion or pneumothorax. Multilevel degenerative changes are seen throughout the thoracic spine. IMPRESSION: 1. 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 2. Chronic appearing increased interstitial lung markings with mild left basilar atelectasis. Electronically Signed   By: Aram Candela M.D.   On: 08/31/2022 00:23     LOS: 1 day   Jeoffrey Massed, MD  [facility]    To contact the attending provider between 7A-7P or the covering provider during after hours 7P- epigastrium.  Active the patient had multiple  Already on sevelamer 2.4 g failure 7A, please log into the web site [email or web address] and access using universal [place] password for that web site. If you do not have the password, please call the hospital operator.  09/01/2022, 10:34 AM   We have used sleeping calcaneal by volume minus by

## 2022-09-02 DIAGNOSIS — R4182 Altered mental status, unspecified: Secondary | ICD-10-CM | POA: Diagnosis not present

## 2022-09-02 DIAGNOSIS — D649 Anemia, unspecified: Secondary | ICD-10-CM | POA: Diagnosis not present

## 2022-09-02 DIAGNOSIS — E43 Unspecified severe protein-calorie malnutrition: Secondary | ICD-10-CM | POA: Insufficient documentation

## 2022-09-02 DIAGNOSIS — N179 Acute kidney failure, unspecified: Secondary | ICD-10-CM | POA: Diagnosis not present

## 2022-09-02 LAB — BASIC METABOLIC PANEL
Anion gap: 5 (ref 5–15)
BUN: 60 mg/dL — ABNORMAL HIGH (ref 8–23)
CO2: 25 mmol/L (ref 22–32)
Calcium: 9.1 mg/dL (ref 8.9–10.3)
Chloride: 105 mmol/L (ref 98–111)
Creatinine, Ser: 1.45 mg/dL — ABNORMAL HIGH (ref 0.44–1.00)
GFR, Estimated: 36 mL/min — ABNORMAL LOW (ref >60)
Glucose, Bld: 126 mg/dL — ABNORMAL HIGH (ref 70–99)
Potassium: 3.1 mmol/L — ABNORMAL LOW (ref 3.5–5.1)
Sodium: 135 mmol/L (ref 135–145)

## 2022-09-02 LAB — CBC
HCT: 23 % — ABNORMAL LOW (ref 36.0–46.0)
Hemoglobin: 7.5 g/dL — ABNORMAL LOW (ref 12.0–15.0)
MCH: 29.6 pg (ref 26.0–34.0)
MCHC: 32.6 g/dL (ref 30.0–36.0)
MCV: 90.9 fL (ref 80.0–100.0)
Platelets: 223 10*3/uL (ref 150–400)
RBC: 2.53 MIL/uL — ABNORMAL LOW (ref 3.87–5.11)
RDW: 15.9 % — ABNORMAL HIGH (ref 11.5–15.5)
WBC: 11.6 10*3/uL — ABNORMAL HIGH (ref 4.0–10.5)
nRBC: 0 % (ref 0.0–0.2)

## 2022-09-02 LAB — PHOSPHORUS: Phosphorus: >30 mg/dL — ABNORMAL HIGH (ref 2.5–4.6)

## 2022-09-02 LAB — BPAM RBC

## 2022-09-02 LAB — TYPE AND SCREEN

## 2022-09-02 LAB — HEPARIN LEVEL (UNFRACTIONATED): Heparin Unfractionated: 0.51 IU/mL (ref 0.30–0.70)

## 2022-09-02 LAB — MAGNESIUM: Magnesium: 1.9 mg/dL (ref 1.7–2.4)

## 2022-09-02 MED ORDER — APIXABAN 5 MG PO TABS: 5.0000 mg | ORAL_TABLET | Freq: Two times a day (BID) | ORAL | Status: DC

## 2022-09-02 MED ORDER — APIXABAN 5 MG PO TABS
10.0000 mg | ORAL_TABLET | Freq: Two times a day (BID) | ORAL | Status: DC
  Administered 2022-09-02 (×2): 10 mg via ORAL
  Filled 2022-09-02 (×3): qty 2

## 2022-09-02 MED ORDER — NEPRO/CARBSTEADY PO LIQD
237.0000 mL | Freq: Two times a day (BID) | ORAL | Status: DC
  Administered 2022-09-02 – 2022-09-08 (×10): 237 mL via ORAL

## 2022-09-02 MED ORDER — MAGNESIUM SULFATE 2 GM/50ML IV SOLN
2.0000 g | Freq: Once | INTRAVENOUS | Status: AC
  Administered 2022-09-02: 2 g via INTRAVENOUS
  Filled 2022-09-02: qty 50

## 2022-09-02 MED ORDER — RENA-VITE PO TABS
1.0000 | ORAL_TABLET | Freq: Every day | ORAL | Status: DC
  Administered 2022-09-03 – 2022-09-07 (×5): 1 via ORAL
  Filled 2022-09-02 (×5): qty 1

## 2022-09-02 MED ORDER — POTASSIUM CHLORIDE CRYS ER 20 MEQ PO TBCR
40.0000 meq | EXTENDED_RELEASE_TABLET | ORAL | Status: AC
  Administered 2022-09-02 (×2): 40 meq via ORAL
  Filled 2022-09-02 (×2): qty 2

## 2022-09-02 NOTE — Progress Notes (Signed)
Physical Therapy Treatment Patient Details Name: LATONJA BRESSAN MRN: [id number] DOB: 01-19-1941 Today's Date: 09/02/2022   History of Present Illness 82 year old F presenting to ED with progressive altered mental status, lethargy and poor p.o. intake and admitted for acute toxic metabolic encephalopathy likely due to pain medication and Compazine as well as AKI with azotemia and hypercalcemia.with PMH of multiple myeloma on chemo, AVR, and HTN    PT Comments    Pt tolerated today's session well, but continues to have limitations in cognition, strength, balance, and pain in her buttock. Pt able to progress ambulation distance today but continues to require minA and cueing for RW management and obstacle negotiation. Cueing for proper hand placement required with each transfer as well as sequencing for bed mobility. Pt's family present throughout session and discussed discharge, family requesting for high intensity PT upon discharge to progress pt back to her independent baseline, recommend at discharge and family agreeable to subacute PT in a facility setting if she doesn't get approved for high intensity rehab. Acute PT will continue to follow up with pt as appropriate.    Recommendations for follow up therapy are one component of a multi-disciplinary discharge planning process, led by the attending physician.  Recommendations may be updated based on patient status, additional functional criteria and insurance authorization.  Follow Up Recommendations       Assistance Recommended at Discharge Frequent or constant Supervision/Assistance  Patient can return home with the following A lot of help with walking and/or transfers;A lot of help with bathing/dressing/bathroom;Assistance with cooking/housework;Direct supervision/assist for medications management;Assist for transportation;Help with stairs or ramp for entrance   Equipment Recommendations  Other (comment) (defer to next level)     Recommendations for Other Services Rehab consult     Precautions / Restrictions Precautions Precautions: Fall Precaution Comments: sacral wound Restrictions Weight Bearing Restrictions: No     Mobility  Bed Mobility Overal bed mobility: Needs Assistance Bed Mobility: Rolling, Sidelying to Sit Rolling: Mod assist Sidelying to sit: Mod assist       General bed mobility comments: increased cueing for sequencing of rolling and sidelying to sit, with hand over hand guidance for use of bed rail, modA for BLE management and trunk support, limited by pain    Transfers Overall transfer level: Needs assistance Equipment used: Rolling walker (2 wheels) Transfers: Sit to/from Stand Sit to Stand: Min assist           General transfer comment: Cueing for proper hand placement for sit<>stand, requiring minA to stand from bed and straight leg armed chair for power up and steady    Ambulation/Gait Ambulation/Gait assistance: Min assist Gait Distance (Feet): 45 Feet (x15 feet second trial after seated rest break) Assistive device: Rolling walker (2 wheels) Gait Pattern/deviations: Step-through pattern, Decreased stride length, Knee flexed in stance - left, Knee flexed in stance - right, Trunk flexed, Drifts right/left Gait velocity: decreased     General Gait Details: minA for balance and cueing for RW management as well as navigation around obstacles and proper technique for turns. Cued for uprigh posture and forward gaze, noted mild B knee flexion during stance phases   Stairs             Wheelchair Mobility    Modified Rankin (Stroke Patients Only)       Balance Overall balance assessment: Needs assistance Sitting-balance support: Feet supported Sitting balance-Leahy Scale: Fair     Standing balance support: Bilateral upper extremity supported, During functional activity, Reliant on  assistive device for balance Standing balance-Leahy Scale: Poor Standing  balance comment: reliant on RW and minA for balance                            Cognition Arousal/Alertness: Awake/alert Behavior During Therapy: WFL for tasks assessed/performed Overall Cognitive Status: Impaired/Different from baseline Area of Impairment: Attention, Following commands, Memory, Safety/judgement, Awareness, Problem solving, Orientation                   Current Attention Level: Sustained Memory: Decreased short-term memory Following Commands: Follows one step commands with increased time Safety/Judgement: Decreased awareness of deficits, Decreased awareness of safety Awareness: Intellectual Problem Solving: Slow processing, Decreased initiation, Difficulty sequencing, Requires verbal cues, Requires tactile cues General Comments: pt pleasant throughout session, but requires increased time and multimodal cues for safety with mobility and command following        Exercises      General Comments General comments (skin integrity, edema, etc.): VSS on room air      Pertinent Vitals/Pain Pain Assessment Pain Assessment: Faces Faces Pain Scale: Hurts even more Pain Location: sacrum Pain Descriptors / Indicators: Grimacing, Guarding, Discomfort, Sore Pain Intervention(s): Limited activity within patient's tolerance, Monitored during session, Repositioned    Home Living                          Prior Function            PT Goals (current goals can now be found in the care plan section) Acute Rehab PT Goals Patient Stated Goal: get stronger and return home PT Goal Formulation: With patient/family Time For Goal Achievement: 09/15/22 Potential to Achieve Goals: Good Progress towards PT goals: Progressing toward goals    Frequency    Min 3X/week      PT Plan Discharge plan needs to be updated    Co-evaluation              AM-PAC PT "6 Clicks" Mobility   Outcome Measure  Help needed turning from your back to your side  while in a flat bed without using bedrails?: A Lot Help needed moving from lying on your back to sitting on the side of a flat bed without using bedrails?: A Lot Help needed moving to and from a bed to a chair (including a wheelchair)?: A Little Help needed standing up from a chair using your arms (e.g., wheelchair or bedside chair)?: A Little Help needed to walk in hospital room?: A Little Help needed climbing 3-5 steps with a railing? : Total 6 Click Score: 14    End of Session Equipment Utilized During Treatment: Gait belt Activity Tolerance: Patient tolerated treatment well Patient left: in chair;with call bell/phone within reach;with family/visitor present Nurse Communication: Mobility status PT Visit Diagnosis: Unsteadiness on feet (R26.81);Muscle weakness (generalized) (M62.81);Difficulty in walking, not elsewhere classified (R26.2);Pain Pain - part of body:  (buttock)     Time: 1610-9604 PT Time Calculation (min) (ACUTE ONLY): 37 min  Charges:  $Gait Training: 8-22 mins                     Lindalou Hose, PT DPT Acute Rehabilitation Services Office [phone]    Leonie Man 09/02/2022, 2:12 PM

## 2022-09-02 NOTE — TOC Initial Note (Signed)
Transition of Care [facility]) - Initial/Assessment Note    Patient Details  Name: [name] MRN: [id number] Date of Birth: 08/18/1940  Transition of Care [facility]) CM/SW Contact:    Mearl Latin, LCSW Phone Number: 09/02/2022, 1:49 PM  Clinical Narrative:                 CSW received consult for possible SNF placement at time of discharge in the event that CIR is unable to accept patient. CSW spoke with patient and two of her sons at bedside. Sons reported that they would like for patient to get rehab prior to returning home given patient's current physical needs and fall risk. Patient expressed understanding of PT recommendation and is agreeable to SNF placement at time of discharge though she would rather return home. CSW discussed insurance authorization process and will provide Medicare SNF ratings list. CSW will send out referrals for review and provide bed offers as available once determination from CIR is made.   Skilled Nursing Rehab Facilities-   [email or web address]   Ratings out of 5 stars (5 the highest)   Name Address  Phone # Quality Care Staffing Health Inspection Overall  [facility] [address] [phone] 2 1 5 4   [facility] [address], [place] [phone] 4 1 3 2   [facility] [address], [facility] [phone] 2 1 1 1   [facility] [address] [place] [phone] 4 1 3 2   [facility]  [address], [place] [phone] 3 2 5 5   [facility] [address], [facility] [phone] 2 1 2 1   [facility] [address] [place] [phone] 4 1 2 1   [facility] [address], [place] [phone] 2 4 3 3   [address] ([place]) [address], [place] [phone] 3 2 2 2   [facility] [address] [place], [place] [phone] 1 2 1 1   [facility] ([place]) [address], [place] [phone] 3 1 1 1    Leyah Stickles [address] Liliane Shi [phone] 4 3 4 4   [facility] [address] [place] [phone] 3 4 3 3           [facility] [address], [place] [phone]      [facility], [place] [address], [place] [phone] 1 1 2 1   [facility] [address] [place] [phone] 2 2 4 4   [facility] [address] Cheree Ditto [phone] 2 1 4 3   [facility] [address] [place] [phone] 3 3 3 3           [address] (no [facility]) [address], [place] [phone] 4 4 5 5   [facility] (No [employer]) [address] Lavera Guise [phone] 2 2 4 4   [facility] [address] [place] [phone] 2 1 2 1   [facility] (No [employer]) [address], [place] [phone] 5 5 5 5   [facility] [address] [facility] [phone] 2 3 5 5   [facility] [address], [place] [phone] 2 1 1 1   [facility] [address] Liliane Shi [phone] 5 2 5 5   [facility]  [address] [place] [phone] 2 2 2 2   [facility] [address], [place] [phone] 4 2 1 1   [facility] [address] [place], [place] [phone] 2 2 3 3           [facility] [address], [place] [phone] 1 1 1 1   [facility] [address] Evlyn Clines  [phone] 2 3 3 3   [facility] (No Humana) [address], [place] [phone] 2 1 3 2   [facility]) [address], Rosalita Levan [phone] 1 1 1 1   [facility] [address], [place] Levan [phone] 3 2 5 5   [facility] [place] [address], [place] [phone] 2 1 1 1           [facility] [address] [place], [place] [phone] 4 4 5 5   [facility])  [address] [place] [phone] 2 1 2 1   [facility]) [address] [place] [phone]  1 4 3   [facility] [address]  [place] [phone] 3 5 4 5   [address] [place], [place] [phone] 3 2 2 2   [facility]) [address] [place] [phone] 2 1 3 2      Expected Discharge Plan: Skilled Nursing Facility Barriers to Discharge: Continued Medical Work up, Insurance Authorization   Patient Goals and CMS Choice Patient states their goals for this hospitalization and ongoing recovery are:: Rehab CMS Medicare.gov Compare Post Acute Care list provided to:: Patient Choice offered to / list presented to : Patient, Adult Children [place] ownership interest in [facility].provided to:: Patient    Expected Discharge Plan and Services In-house Referral: Clinical Social Work   Post Acute Care Choice: Skilled Nursing Facility Living arrangements for the past 2 months: Single Family Home                                      Prior Living Arrangements/Services Living arrangements for the past 2 months: Single Family Home Lives with:: Self Patient language and need for interpreter reviewed:: Yes Do you feel safe going back to the place where you live?: Yes      Need for Family Participation in Patient Care: Yes (Comment) Care giver support system in place?: Yes (comment)   Criminal Activity/Legal Involvement Pertinent to Current Situation/Hospitalization: No - Comment as needed  Activities of Daily Living Home Assistive Devices/Equipment: [name] (specify type) (front wheeled walker) ADL Screening (condition at time of admission) Patient's cognitive ability adequate to safely complete daily activities?: Yes Is the patient deaf or have difficulty hearing?: No Does the patient have difficulty seeing, even when wearing glasses/contacts?: No Does the patient have difficulty concentrating, remembering, or making decisions?: No Patient able to express need for assistance with ADLs?: Yes Does the patient have difficulty dressing or bathing?:  No Independently performs ADLs?: No Communication: Independent Is this a change from baseline?: Change from baseline, expected to last >3 days Dressing (OT): Dependent Is this a change from baseline?: Change from baseline, expected to last >3 days Grooming: Dependent Is this a change from baseline?: Change from baseline, expected to last >3 days Feeding: Independent Bathing: Dependent Is this a change from baseline?: Change from baseline, expected to last >3 days Toileting: Needs assistance Is this a change from baseline?: Change from baseline, expected to last >3days In/Out Bed: Needs assistance Is this a change from baseline?: Change from baseline, expected to last >3 days Walks in Home: Needs assistance Is this a change from baseline?: Change from baseline, expected to last >3 days Does the patient have difficulty walking or climbing stairs?: Yes  Weakness of Legs: Both Weakness of Arms/Hands: Both  Permission Sought/Granted Permission sought to share information with : Facility Medical sales representative, Family Supports Permission granted to share information with : Yes, Verbal Permission Granted  Share Information with NAME: Kelly/Harry/[name]  Permission granted to share info w AGENCY: SNFs  Permission granted to share info w Relationship: Sons  Permission granted to share info w Contact Information: [phone]  Emotional Assessment Appearance:: Appears stated age Attitude/Demeanor/Rapport: Engaged Affect (typically observed): Accepting, Appropriate Orientation: : Oriented to Self, Oriented to Place, Oriented to  Time Alcohol / Substance Use: Not Applicable Psych Involvement: No (comment)  Admission diagnosis:  Hypercalcemia [E83.52] Elevated troponin [R79.89] Acute kidney injury (nontraumatic) (HCC) [N17.9] Normochromic normocytic anemia [D64.9] Altered mental status, unspecified altered mental status type [R41.82] AMS (altered mental status) [R41.82] Acute encephalopathy  [G93.40] Patient Active Problem List   Diagnosis Date Noted   Protein-calorie malnutrition, severe 09/02/2022   Pathological fracture of thoracic vertebra 08/31/2022   Acute renal failure superimposed on stage 3a chronic kidney disease (HCC) 08/31/2022   AMS (altered mental status) 08/31/2022   Electrolyte abnormality 08/31/2022   Anemia 08/31/2022   Goals of care, counseling/discussion 08/31/2022   Acute encephalopathy 08/31/2022   Multiple myeloma (HCC) 08/27/2022   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   Thymoma 02/25/2022   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/29/2022   Mediastinal mass 01/28/2022   Pulmonary HTN (HCC) 11/16/2021   Ascending aorta dilatation (HCC)    Hyperlipidemia 04/16/2016   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/2015   Carotid artery stenosis 11/10/2013   S/P aortic valve replacement 10/17/2013   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 10/14/2013   Essential hypertension, benign 08/04/2013   PCP:  [name], MD Pharmacy:   [facility] ([facility]) - [place], [place] - [address] [place] [address] Phone: [phone] Fax: [phone]  [id number] [place], [place] - [address] AT [facility] [address] [place] [address] Phone: [phone] Fax: [phone]     Social Determinants of Health (SDOH) Social History: SDOH Screenings   Food Insecurity: No Food Insecurity (08/31/2022)  Housing: Low Risk  (08/31/2022)  Transportation Needs: No Transportation Needs (08/31/2022)  Utilities: Not At Risk (08/31/2022)  Tobacco Use: Low Risk  (09/01/2022)   SDOH Interventions:     Readmission Risk Interventions     No data to display

## 2022-09-02 NOTE — Progress Notes (Addendum)
PROGRESS NOTE        PATIENT DETAILS Name: [name] Age: 82 y.o. Sex: female Date of Birth: 1940-12-15 Admit Date: 08/30/2022 Admitting Physician Almon Hercules, MD ZOX:WRUE, Dorris Singh, MD  Brief Summary: Patient is a 82 y.o.  female history of multiple myeloma, HTN-who presented with encephalopathy in the setting of narcotic use/AKI.  See below for further details.  Significant events: 5/24>> admit to [facility]  Significant studies: 5/25>> CXR: Chronic interstitial lung markings. 5/25>> CT head: No acute intracranial abnormality. 5/25>> renal ultrasound: No hydronephrosis 5/25>> x-ray pelvis: No acute osseous abnormality 5/25>> bilateral lower extremity Doppler: Left leg DVT involving posterior tibial/peroneal vein.  Significant microbiology data: None  Procedures: None  Consults: None  Subjective: Answers questions appropriately-no major issues overnight.  Objective: Vitals: Blood pressure (!) 152/49, pulse (!) 53, temperature 98.1 F (36.7 C), resp. rate 18, height 5\' 1"  (1.549 m), weight 68 kg, SpO2 93 %.   Exam: Gen Exam:Alert awake-not in any distress HEENT:atraumatic, normocephalic Chest: B/L clear to auscultation anteriorly CVS:S1S2 regular Abdomen:soft non tender, non distended Extremities:no edema Neurology: Non focal Skin: no rash  Pertinent Labs/Radiology:    Latest Ref Rng & Units 09/02/2022    3:41 AM 09/01/2022    1:12 AM 08/31/2022    5:16 AM  CBC  WBC 4.0 - 10.5 K/uL 11.6  12.5  17.8   Hemoglobin 12.0 - 15.0 g/dL 7.5  6.7  7.5   Hematocrit 36.0 - 46.0 % 23.0  21.2  24.2   Platelets 150 - 400 K/uL 223  249  299     Lab Results  Component Value Date   NA 135 09/02/2022   K 3.1 (L) 09/02/2022   CL 105 09/02/2022   CO2 25 09/02/2022      Assessment/Plan: Acute toxic metabolic encephalopathy Secondary to a long-acting morphine use (recently started)/AKI Significant improvement in encephalopathy-and close to  baseline.    AKI Likely hemodynamically mediated UA negative for proteinuria-renal ultrasound negative for hydronephrosis Creatinine continues to improve with just supportive care-no longer on IV fluids.  Normocytic anemia 2/2 acute illness-IVF dilution supoerimposed on anemia of malignancy/mulitple myeloma No evidence of blood loss-no hx of hematochezia/melena Hb stable at 7.5. Follows [facility]  Left Leg DVT Suspect could transition to DOAC today.  Stop IV heparin.  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  History of bioprosthetic aortic valve replacement 2015 Recent echo 7/23-showed stable prosthetic valve.  HTN BP stable-continue low-dose amlodipine.   Continue to hold lisinopril/HCTZ given resolving AKI.  Debility/deconditioning Due to due to illness superimposed on underlying malignancy. PT/OT eval ongoing-initial recommendations are for home health-however updated recommendations are for CIR versus SNF.   Deep tissue pressure injury to sacrum Present prior to hospitalization Appreciate wound care input Continue wound care per wound care RN.  Pressure Injury 08/31/22 Sacrum Mid Deep Tissue Pressure Injury - Purple or maroon localized area of discolored intact skin or blood-filled blister due to damage of underlying soft tissue from pressure and/or shear. marron, nonblancable (Active)  08/31/22 1300  Location: Sacrum  Location Orientation: Mid  Staging: Deep Tissue Pressure Injury - Purple or maroon localized area of discolored  intact skin or blood-filled blister due to damage of underlying soft tissue from pressure and/or shear.  Wound Description (Comments):  marron, nonblancable  Present on Admission: Yes    Nutrition Status: Nutrition Problem: Severe Malnutrition Etiology: chronic illness Signs/Symptoms: percent weight loss, energy intake < 75% for > or equal to 1 month Percent weight loss: 13 % Interventions: MVI, Education, Refer to RD note for recommendations, Nepro shake     BMI: Estimated body mass index is 28.33 kg/m as calculated from the following:   Height as of this encounter: 5\' 1"  (1.549 m).   Weight as of this encounter: 68 kg.   Code status:   Code Status: Full Code   DVT Prophylaxis: IV heparin   Family Communication: Daughter-in-law at bedside   Disposition Plan: Status is: Inpatient Remains inpatient appropriate because: Severity of illness   Planned Discharge Destination:Home health versus SNF versus CIR   Diet: Diet Order             Diet renal/carb modified with fluid restriction Diet-HS Snack? Nothing; Fluid restriction: 2000 mL Fluid; Room service appropriate? Yes; Fluid consistency: Thin  Diet effective now                     Antimicrobial agents: Anti-infectives (From admission, onward)    None        MEDICATIONS: Scheduled Meds:  acetaminophen  1,000 mg Oral Q8H   amLODipine  5 mg Oral Daily   lidocaine  1 patch Transdermal Q24H   potassium chloride  40 mEq Oral Q4H   sevelamer carbonate  2.4 g Oral TID WC   sodium chloride flush  3 mL Intravenous Q12H   Continuous Infusions:  heparin 1,200 Units/hr (09/02/22 0925)   PRN Meds:.hydrALAZINE, naLOXone (NARCAN)  injection, oxyCODONE   I have personally reviewed following labs and imaging studies  LABORATORY DATA: CBC: Recent Labs  Lab 08/30/22 2348 08/31/22 0516 09/01/22 0112 09/02/22 0341  WBC 13.5* 17.8* 12.5* 11.6*  NEUTROABS 11.0* 13.5* 8.9*  --   HGB 8.2* 7.5* 6.7* 7.5*  HCT 25.9* 24.2* 21.2* 23.0*  MCV 95.2 94.5 92.2 90.9  PLT 315 299 249 223     Basic Metabolic Panel: Recent Labs  Lab 08/30/22 2348  08/31/22 0516 08/31/22 1431 09/01/22 0112 09/02/22 0341  NA 127* 128* 130* 132* 135  K 3.5 3.6 3.2* 3.0* 3.1*  CL 89* 95* 98 101 105  CO2 29 22 27 25 25   GLUCOSE 89 106* 115* 110* 126*  BUN 58* 57* 55* 55* 60*  CREATININE 2.73* 2.37* 2.08* 1.79* 1.45*  CALCIUM 10.5* 9.5 9.5 9.0 9.1  MG  --  1.8  --  1.6* 1.9  PHOS  --  >30.0* >30.0* >30.0* >30.0*     GFR: Estimated Creatinine Clearance: 25.5 mL/min (A) (by C-G formula based on SCr of 1.45 mg/dL (H)).  Liver Function Tests: Recent Labs  Lab 08/31/22 0031 08/31/22 0516 08/31/22 1431 09/01/22 0112  AST 55* 73*  --  51*  ALT 31 35  --  33  ALKPHOS 123 114  --  105  BILITOT 0.9 1.4*  --  0.8  PROT 11.0* 10.3*  --  9.1*  ALBUMIN <1.5* <1.5* <1.5* <1.5*    No results for input(s): "LIPASE", "AMYLASE" in the last 168 hours. Recent Labs  Lab 08/31/22 1431 09/01/22 0112  AMMONIA 20 28     Coagulation Profile: No results for input(s): "INR", "PROTIME" in the last 168 hours.  Cardiac Enzymes:  Recent Labs  Lab 09/01/22 0112  CKTOTAL 119     BNP (last 3 results) No results for input(s): "PROBNP" in the last 8760 hours.  Lipid Profile: No results for input(s): "CHOL", "HDL", "LDLCALC", "TRIG", "CHOLHDL", "LDLDIRECT" in the last 72 hours.  Thyroid Function Tests: Recent Labs    08/31/22 0514 08/31/22 0516  TSH 0.767  --   FREET4  --  1.09     Anemia Panel: Recent Labs    08/31/22 1431 09/01/22 0112  VITAMINB12 840 977*  FOLATE  --  5.2*  FERRITIN  --  595*  TIBC  --  NOT CALCULATED  IRON  --  83  RETICCTPCT  --  2.5     Urine analysis:    Component Value Date/Time   COLORURINE YELLOW 08/31/2022 0045   APPEARANCEUR HAZY (A) 08/31/2022 0045   LABSPEC 1.013 08/31/2022 0045   PHURINE 5.0 08/31/2022 0045   GLUCOSEU NEGATIVE 08/31/2022 0045   HGBUR MODERATE (A) 08/31/2022 0045   BILIRUBINUR NEGATIVE 08/31/2022 0045   KETONESUR 5 (A) 08/31/2022 0045   PROTEINUR NEGATIVE 08/31/2022 0045    UROBILINOGEN 0.2 10/12/2013 1117   NITRITE NEGATIVE 08/31/2022 0045   LEUKOCYTESUR NEGATIVE 08/31/2022 0045    Sepsis Labs: Lactic Acid, Venous No results found for: "LATICACIDVEN"  MICROBIOLOGY: No results found for this or any previous visit (from the past 240 hour(s)).  RADIOLOGY STUDIES/RESULTS: VAS [place] LOWER EXTREMITY VENOUS (DVT)  Result Date: 08/31/2022  Lower Venous DVT Study Patient Name:  BLIMI WINTER  Date of Exam:   08/31/2022 Medical Rec #: [id number]     Accession #:    [id number] Date of Birth: 04/03/1941     Patient Gender: F Patient Age:   46 years Exam Location:  [facility] Procedure:      VAS [place] LOWER EXTREMITY VENOUS (DVT) Referring Phys: Alwyn Ren GONFA --------------------------------------------------------------------------------  Indications: Bilateral leg swelling.  Risk Factors: Cancer - multiple myeloma. Comparison Study: No prior studies. Performing Technologist: Jean Rosenthal RDMS, RVT  Examination Guidelines: A complete evaluation includes B-mode imaging, spectral Doppler, color Doppler, and power Doppler as needed of all accessible portions of each vessel. Bilateral testing is considered an integral part of a complete examination. Limited examinations for reoccurring indications may be performed as noted. The reflux portion of the exam is performed with the patient in reverse Trendelenburg.  +---------+---------------+---------+-----------+----------+--------------+ RIGHT    CompressibilityPhasicitySpontaneityPropertiesThrombus Aging +---------+---------------+---------+-----------+----------+--------------+ CFV      Full           Yes      Yes                                 +---------+---------------+---------+-----------+----------+--------------+ SFJ      Full                                                        +---------+---------------+---------+-----------+----------+--------------+ FV Prox  Full                                                         +---------+---------------+---------+-----------+----------+--------------+ FV Mid   Full                                                        +---------+---------------+---------+-----------+----------+--------------+  FV DistalFull                                                        +---------+---------------+---------+-----------+----------+--------------+ PFV      Full                                                        +---------+---------------+---------+-----------+----------+--------------+ POP      Full           Yes      Yes                                 +---------+---------------+---------+-----------+----------+--------------+ PTV      Full                                                        +---------+---------------+---------+-----------+----------+--------------+ PERO     Full                                                        +---------+---------------+---------+-----------+----------+--------------+   +---------+---------------+---------+-----------+----------+--------------+ LEFT     CompressibilityPhasicitySpontaneityPropertiesThrombus Aging +---------+---------------+---------+-----------+----------+--------------+ CFV      Full           Yes      Yes                                 +---------+---------------+---------+-----------+----------+--------------+ SFJ      Full                                                        +---------+---------------+---------+-----------+----------+--------------+ FV Prox  Full                                                        +---------+---------------+---------+-----------+----------+--------------+ FV Mid   Full                                                        +---------+---------------+---------+-----------+----------+--------------+ FV DistalFull                                                         +---------+---------------+---------+-----------+----------+--------------+  PFV      Full                                                        +---------+---------------+---------+-----------+----------+--------------+ POP      Full                                                        +---------+---------------+---------+-----------+----------+--------------+ PTV      Partial        Yes      Yes                  Acute          +---------+---------------+---------+-----------+----------+--------------+ PERO     None           No       No                   Acute          +---------+---------------+---------+-----------+----------+--------------+     Summary: RIGHT: - There is no evidence of deep vein thrombosis in the lower extremity.  - No cystic structure found in the popliteal fossa.  LEFT: - Findings consistent with acute deep vein thrombosis involving the left posterior tibial veins, and left peroneal veins. - A cystic structure is found in the popliteal fossa.  *See table(s) above for measurements and observations. Electronically signed by Waverly Ferrari MD on 08/31/2022 at 7:59:15 PM.    Final      LOS: 2 days   Jeoffrey Massed, MD  [facility]    To contact the attending provider between 7A-7P or the covering provider during after hours 7P- epigastrium.  Active the patient had multiple  Already on sevelamer 2.4 g failure 7A, please log into the web site [email or web address] and access using universal [place] password for that web site. If you do not have the password, please call the hospital operator.  09/02/2022, 12:07 PM   We have used sleeping calcaneal by volume minus by

## 2022-09-02 NOTE — Progress Notes (Addendum)
ANTICOAGULATION CONSULT NOTE - Follow Up Consult  Pharmacy Consult for heparin Indication: DVT  Allergies  Allergen Reactions   Crestor [Rosuvastatin Calcium] Other (See Comments)    Myalgias with 5mg  daily dose   Lactose Other (See Comments)   Lipitor [Atorvastatin] Other (See Comments)    Stomach pain   Pravastatin Other (See Comments)    Myalgias    Simvastatin Other (See Comments)    Myalgias    Tramadol Other (See Comments)    FEELS LOOPY   Zetia [Ezetimibe]     Myalgias   Codeine Palpitations   Penicillins Palpitations   Sulfa Antibiotics Palpitations   Vancomycin Itching and Other (See Comments)    Pt developed flushing and itchy scalp greater than into the infusion.  RedMan syndrone developed.  Will need slower admin rate for future infusions.   Patient Measurements: Height: 5\' 1"  (154.9 cm) Weight: 68 kg (149 lb 14.6 oz) IBW/kg (Calculated) : 47.8 kg Heparin Dosing Weight: 62.1 kg  Vital Signs: Temp: 98.6 F (37 C) (05/27 0500) Temp Source: Oral (05/27 0500) BP: 159/57 (05/27 0500) Pulse Rate: 53 (05/27 0500)  Labs: Recent Labs    08/31/22 0140 08/31/22 0516 08/31/22 0516 08/31/22 0654 08/31/22 1431 09/01/22 0112 09/01/22 1004 09/01/22 1742 09/02/22 0341  HGB  --  7.5*  --   --   --  6.7*  --   --  7.5*  HCT  --  24.2*  --   --   --  21.2*  --   --  23.0*  PLT  --  299  --   --   --  249  --   --  223  HEPARINUNFRC  --   --    < >  --   --  0.19* 0.39 0.47 0.51  CREATININE  --  2.37*  --   --  2.08* 1.79*  --   --  1.45*  CKTOTAL  --   --   --   --   --  119  --   --   --   TROPONINIHS 51* 55*  --  78*  --   --   --   --   --    < > = values in this interval not displayed.    Estimated Creatinine Clearance: 25.5 mL/min (A) (by C-G formula based on SCr of 1.45 mg/dL (H)).  Medications:  Infusions:   heparin 1,200 Units/hr (09/02/22 0026)   magnesium sulfate bolus IVPB     Assessment: 82 yo female with multiple myeloma who presents  with altered mental status. Dopplers show acute DVT in LLE.    Heparin level therapeutic at 0.51, on 1200 units/hr. No bleeding or issues with infusion noted  Goal of Therapy:  Heparin level 0.3-0.7 units/ml Monitor platelets by anticoagulation protocol: Yes   Plan:  Continue heparin 1200 units/hr. Daily CBC, heparin level. Monitor for signs/symptoms of bleeding.   Georga Hacking, Pharm.D PGY1 Pharmacy Resident 09/02/2022 7:14 AM  Please check AMION for all [facility] Pharmacy phone numbers After 10:00 PM, call Main Pharmacy [phone]   Addendum: Pharmacy consulted to transition from heparin to apixaban for DVT Plan: Stop heparin Start apixaban 10 mg PO BID x 7 days, followed by 5 mg BID  Continue to monitor CBC  Georga Hacking, Pharm.D PGY1 Pharmacy Resident 09/02/2022 1:27 PM

## 2022-09-02 NOTE — [place] FL2 (Signed)
[place] MEDICAID FL2 LEVEL OF CARE FORM     IDENTIFICATION  Patient Name: [name] Birthdate: 10-24-1940 Sex: female Admission Date (Current Location): 08/30/2022  [facility] and [place] Number:  Producer, television/film/video and Address:  [facility], [address], [place], [place] [address]      Provider Number: [id number]  Attending Physician Name and Address:  Maretta Bees, MD  Relative Name and Phone Number:       Current Level of Care: Hospital Recommended Level of Care: Skilled Nursing Facility Prior Approval Number:    Date Approved/Denied:   PASRR Number: [id number]  Discharge Plan: SNF    Current Diagnoses: Patient Active Problem List   Diagnosis Date Noted   Pathological fracture of thoracic vertebra 08/31/2022   Acute renal failure superimposed on stage 3a chronic kidney disease (HCC) 08/31/2022   AMS (altered mental status) 08/31/2022   Electrolyte abnormality 08/31/2022   Anemia 08/31/2022   Goals of care, counseling/discussion 08/31/2022   Acute encephalopathy 08/31/2022   Multiple myeloma (HCC) 08/27/2022   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   Thymoma 02/25/2022   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/29/2022   Mediastinal mass 01/28/2022   Pulmonary HTN (HCC) 11/16/2021   Ascending aorta dilatation (HCC)    Hyperlipidemia 04/16/2016   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/2015   Carotid artery stenosis 11/10/2013   S/P aortic valve replacement 10/17/2013   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 10/14/2013   Essential hypertension, benign 08/04/2013    Orientation RESPIRATION BLADDER Height & Weight     Self, Situation, Place  O2 (2L nasal cannula) Incontinent, External catheter Weight: 149 lb 14.6 oz (68 kg) Height:  5\' 1"  (154.9 cm)  BEHAVIORAL SYMPTOMS/MOOD NEUROLOGICAL BOWEL NUTRITION STATUS      Continent Diet (See dc summary)  AMBULATORY STATUS COMMUNICATION OF NEEDS Skin   Limited Assist Verbally Other  (Comment) (Deep tissue injury on sacrum)                       Personal Care Assistance Level of Assistance  Bathing, Feeding, Dressing Bathing Assistance: Maximum assistance Feeding assistance: Limited assistance Dressing Assistance: Limited assistance     Functional Limitations Info             SPECIAL CARE FACTORS FREQUENCY  PT (By licensed PT), OT (By licensed OT)     PT Frequency: 5x/week OT Frequency: 5x/week            Contractures Contractures Info: Not present    Additional Factors Info  Code Status, Allergies Code Status Info: Full Allergies Info: Crestor (Rosuvastatin Calcium), Lactose, Lipitor (Atorvastatin), Pravastatin, Simvastatin, Tramadol, Zetia (Ezetimibe), Codeine, Penicillins, Sulfa Antibiotics, Vancomycin           Current Medications (09/02/2022):  This is the current hospital active medication list Current Facility-Administered Medications  Medication Dose Route Frequency Provider Last Rate Last Admin   acetaminophen (TYLENOL) tablet 1,000 mg  1,000 mg Oral Q8H Ghimire, Shanker M, MD   1,000 mg at 09/02/22 0548   amLODipine (NORVASC) tablet 5 mg  5 mg Oral Daily Maretta Bees, MD   5 mg at 09/02/22 0914   heparin ADULT infusion 100 units/mL (25000 units/215mL)  1,200 Units/hr Intravenous Continuous Juliette Mangle, RPH 12 mL/hr at 09/02/22 0925 1,200 Units/hr at 09/02/22 0925   hydrALAZINE (APRESOLINE) injection 5 mg  5 mg Intravenous Q4H PRN Gertha Calkin, MD       lidocaine (LIDODERM)  5 % 1 patch  1 patch Transdermal Q24H Maretta Bees, MD   1 patch at 09/01/22 1431   naloxone (NARCAN) injection 0.4 mg  0.4 mg Intravenous PRN Candelaria Stagers T, MD       oxyCODONE (Oxy IR/ROXICODONE) immediate release tablet 5 mg  5 mg Oral Q6H PRN Maretta Bees, MD   5 mg at 09/02/22 0116   potassium chloride SA (KLOR-CON M) CR tablet 40 mEq  40 mEq Oral Q4H Maretta Bees, MD   40 mEq at 09/02/22 0915   sevelamer carbonate (RENVELA) powder  PACK 2.4 g  2.4 g Oral TID WC Gonfa, Taye T, MD   2.4 g at 09/01/22 1735   sodium chloride flush (NS) 0.9 % injection 3 mL  3 mL Intravenous Q12H Gertha Calkin, MD   3 mL at 09/01/22 2126     Discharge Medications: Please see discharge summary for a list of discharge medications.  Relevant Imaging Results:  Relevant Lab Results:   Additional Information SSN: [id number]. Will follow up at the cancer center at [facility] for further tx.  Mearl Latin, LCSW

## 2022-09-02 NOTE — Progress Notes (Signed)
Inpatient Rehab Admissions Coordinator:   Per therapy recommendations, patient was screened for CIR candidacy by Megan Salon, MS, CCC-SLP. At this time, Pt. Does not have a diagnosis that her payor is likely to approve for CIR. Her encephalopathy appears to be resolving, as family states she is near baseline.  I  will not pursue a rehab consult for this Pt.   Recommend other rehab venues to be pursued.  Please contact me with any questions.   Megan Salon, MS, CCC-SLP Rehab Admissions Coordinator  [phone] (celll) [phone] (office)

## 2022-09-02 NOTE — Progress Notes (Signed)
Occupational Therapy Treatment Patient Details Name: [name] MRN: [id number] DOB: 1940/07/04 Today's Date: 09/02/2022   History of present illness 82 year old F presenting to ED with progressive altered mental status, lethargy and poor p.o. intake and admitted for acute toxic metabolic encephalopathy likely due to pain medication and Compazine as well as AKI with azotemia and hypercalcemia.with PMH of multiple myeloma on chemo, AVR, and HTN   OT comments  Pt with improved attention, but continues to demonstrate slow processing, decreased ability to follow directions and impaired memory. Sacral wound is painful with sitting, geomat provided. Pt requires moderate assist to EOB, min assist for ambulation with RW with pt able to walk in hall today. Sat for grooming at sink--completed 4 activities with set up to min assist. Family has not been able to secure 24 hour care at home. Patient will benefit from intensive inpatient follow up therapy, >3 hours/day prior to return home.   Recommendations for follow up therapy are one component of a multi-disciplinary discharge planning process, led by the attending physician.  Recommendations may be updated based on patient status, additional functional criteria and insurance authorization.    Assistance Recommended at Discharge Frequent or constant Supervision/Assistance  Patient can return home with the following  A little help with walking and/or transfers;A lot of help with bathing/dressing/bathroom;Assistance with cooking/housework;Direct supervision/assist for medications management;Direct supervision/assist for financial management;Assist for transportation;Help with stairs or ramp for entrance   Equipment Recommendations  BSC/3in1    Recommendations for Other Services      Precautions / Restrictions Precautions Precautions: Fall Precaution Comments: sacral wound Restrictions Weight Bearing Restrictions: No       Mobility Bed  Mobility Overal bed mobility: Needs Assistance Bed Mobility: Rolling, Sidelying to Sit Rolling: Mod assist Sidelying to sit: Mod assist       General bed mobility comments: step by step cues to sequence, assist for all aspects    Transfers Overall transfer level: Needs assistance Equipment used: Rolling walker (2 wheels) Transfers: Sit to/from Stand Sit to Stand: Min assist           General transfer comment: cues for hand placement, assist to rise and steady     Balance Overall balance assessment: Needs assistance   Sitting balance-Leahy Scale: Fair     Standing balance support: Bilateral upper extremity supported Standing balance-Leahy Scale: Poor Standing balance comment: min assist and RW                           ADL either performed or assessed with clinical judgement   ADL Overall ADL's : Needs assistance/impaired     Grooming: Wash/dry hands;Wash/dry face;Oral care;Brushing hair;Sitting;Minimal assistance Grooming Details (indicate cue type and reason): seated at sink                             Functional mobility during ADLs: Minimal assistance;Rolling walker (2 wheels)      Extremity/Trunk Assessment              Vision       Perception     Praxis      Cognition Arousal/Alertness: Awake/alert Behavior During Therapy: WFL for tasks assessed/performed Overall Cognitive Status: Impaired/Different from baseline Area of Impairment: Attention, Following commands, Memory, Safety/judgement, Awareness, Problem solving, Orientation                 Orientation Level: Disoriented to, Situation, Time Current  Attention Level: Sustained Memory: Decreased short-term memory Following Commands: Follows one step commands with increased time (multimodal cues at times) Safety/Judgement: Decreased awareness of deficits, Decreased awareness of safety Awareness: Intellectual Problem Solving: Slow processing, Decreased  initiation, Difficulty sequencing, Requires verbal cues, Requires tactile cues          Exercises      Shoulder Instructions       General Comments      Pertinent Vitals/ Pain       Pain Assessment Pain Assessment: Faces Faces Pain Scale: Hurts even more Pain Location: sacrum Pain Descriptors / Indicators: Grimacing, Guarding, Discomfort, Sore Pain Intervention(s): Repositioned, Monitored during session  Home Living                                          Prior Functioning/Environment              Frequency  Min 2X/week        Progress Toward Goals  OT Goals(current goals can now be found in the care plan section)  Progress towards OT goals: Progressing toward goals  Acute Rehab OT Goals OT Goal Formulation: With patient Time For Goal Achievement: 09/14/22 Potential to Achieve Goals: Good  Plan Discharge plan needs to be updated    Co-evaluation                 AM-PAC OT "6 Clicks" Daily Activity     Outcome Measure   Help from another person eating meals?: A Little Help from another person taking care of personal grooming?: A Little Help from another person toileting, which includes using toliet, bedpan, or urinal?: Total Help from another person bathing (including washing, rinsing, drying)?: A Lot Help from another person to put on and taking off regular upper body clothing?: A Little Help from another person to put on and taking off regular lower body clothing?: Total 6 Click Score: 13    End of Session Equipment Utilized During Treatment: Rolling walker (2 wheels);Gait belt  OT Visit Diagnosis: Unsteadiness on feet (R26.81);Other abnormalities of gait and mobility (R26.89);Muscle weakness (generalized) (M62.81);Pain;Other symptoms and signs involving cognitive function   Activity Tolerance Patient tolerated treatment well   Patient Left in chair;with call bell/phone within reach;with family/visitor present   Nurse  Communication Mobility status;Other (comment) (cushion in chair)        Time: 1610-9604 OT Time Calculation (min): 38 min  Charges: OT General Charges $OT Visit: 1 Visit OT Treatments $Self Care/Home Management : 8-22 mins  Berna Spare, OTR/L Acute Rehabilitation Services Office: [phone]   Evern Bio 09/02/2022, 11:34 AM

## 2022-09-02 NOTE — Progress Notes (Signed)
Initial Nutrition Assessment  DOCUMENTATION CODES:   Severe malnutrition in context of chronic illness  INTERVENTION:  Nepro Shake po BID, each supplement provides 425 kcal and 19 grams protein (suitable for lactose intolerance)  Renal MVI   Encourage po intake   Educate about adequate nutrition intake for wound healing   NUTRITION DIAGNOSIS:   Severe Malnutrition related to chronic illness as evidenced by percent weight loss, energy intake < 75% for > or equal to 1 month.   GOAL:   Patient will meet greater than or equal to 90% of their needs   MONITOR:   PO intake, I & O's, Supplement acceptance, Labs, Weight trends, Skin  REASON FOR ASSESSMENT:  Malnutrition Screening Tool    ASSESSMENT:   81 y.o. female with PMHx including multiple myeloma, HTN presents with encephalopathy in the setting of narcotic use/AKI and decreased po intake  Per MD notes: - Patient lives with her son who has been providing around the clock care to her, she has been reported to sleep a lot. 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 Her son was at bedside and reports she has not been eating well for a while. Patient and her son were shocked to hear about patient's weight loss. She reports sleeping through most meal times and not feeling hungry.   Her son reports she has been more alert since admission in comparison to being at home.    Patient reports pain to her bottom where skin breakdown has occurred. RD informed her that good nutrition can help heal her pressure injury  Patient agreeable to trying ONS and encouraged to take a few bites at each meal to start rebuilding her appetite along with drinking her calories until solid food po intake improves   Labs: K+ 3.1, Glu 126, BUN 60, Cr 1.45,  phos >30, folate 5.2  Meds: magnesium sulfate, klor-con, renvela, NS  Wt: 9.2 kg (13%) wt loss x 2 months   09/02/22 68 kg  08/27/22 64.3 kg  08/20/22 70.1 kg  08/06/22 70.1 kg  06/18/22 73.5 kg  Po: 0% meal intake x 1 documented meal I/O's: +2.9 L    NUTRITION - FOCUSED PHYSICAL EXAM:  Flowsheet Row Most Recent Value  Orbital Region Moderate depletion  Upper Arm Region Moderate depletion  Thoracic and Lumbar Region No depletion  Buccal Region Moderate depletion  Temple Region Moderate depletion  Clavicle Bone Region Moderate depletion  Scapular Bone Region Unable to assess  Dorsal Hand Mild depletion  Patellar Region Moderate depletion  Anterior Thigh Region Mild depletion  Posterior Calf Region Moderate depletion  Edema (RD Assessment) None  Hair Reviewed  Eyes Reviewed  Mouth Reviewed  Skin Reviewed  Nails Reviewed       Diet Order:   Diet Order             Diet renal/carb modified with fluid restriction Diet-HS Snack? Nothing; Fluid restriction: 2000 mL Fluid; Room service appropriate? Yes; Fluid consistency: Thin  Diet effective now                   EDUCATION NEEDS:   Education needs have been addressed  Skin:  Skin Assessment: Skin Integrity Issues: Skin Integrity Issues:: DTI DTI: sacrum  Last BM:  5/25  Height:   Ht  Readings from Last 1 Encounters:  08/31/22 5\' 1"  (1.549 m)    Weight:   Wt Readings from Last 1 Encounters:  09/02/22 68 kg    Ideal Body Weight:     BMI:  Body mass index is 28.33 kg/m.  Estimated Nutritional Needs:   Kcal:  1500-1700 kcal  Protein:  82-102 g  Fluid:  >1.5 L    Leodis Rains, RDN, LDN  Clinical Nutrition

## 2022-09-03 ENCOUNTER — Other Ambulatory Visit (HOSPITAL_COMMUNITY): Payer: Self-pay

## 2022-09-03 ENCOUNTER — Telehealth: Payer: Self-pay | Admitting: Medical Oncology

## 2022-09-03 ENCOUNTER — Encounter (HOSPITAL_COMMUNITY): Payer: Self-pay | Admitting: Internal Medicine

## 2022-09-03 DIAGNOSIS — R4182 Altered mental status, unspecified: Secondary | ICD-10-CM | POA: Diagnosis not present

## 2022-09-03 DIAGNOSIS — N179 Acute kidney failure, unspecified: Secondary | ICD-10-CM | POA: Diagnosis not present

## 2022-09-03 DIAGNOSIS — D649 Anemia, unspecified: Secondary | ICD-10-CM | POA: Diagnosis not present

## 2022-09-03 LAB — CBC
HCT: 23.5 % — ABNORMAL LOW (ref 36.0–46.0)
HCT: 25.8 % — ABNORMAL LOW (ref 36.0–46.0)
Hemoglobin: 7.7 g/dL — ABNORMAL LOW (ref 12.0–15.0)
Hemoglobin: 8.3 g/dL — ABNORMAL LOW (ref 12.0–15.0)
MCH: 30.2 pg (ref 26.0–34.0)
MCH: 29.9 pg (ref 26.0–34.0)
MCHC: 32.8 g/dL (ref 30.0–36.0)
MCHC: 32.2 g/dL (ref 30.0–36.0)
MCV: 92.2 fL (ref 80.0–100.0)
MCV: 92.8 fL (ref 80.0–100.0)
Platelets: 243 10*3/uL (ref 150–400)
Platelets: 249 10*3/uL (ref 150–400)
RBC: 2.55 MIL/uL — ABNORMAL LOW (ref 3.87–5.11)
RBC: 2.78 MIL/uL — ABNORMAL LOW (ref 3.87–5.11)
RDW: 16 % — ABNORMAL HIGH (ref 11.5–15.5)
RDW: 16 % — ABNORMAL HIGH (ref 11.5–15.5)
WBC: 12 10*3/uL — ABNORMAL HIGH (ref 4.0–10.5)
WBC: 14.2 10*3/uL — ABNORMAL HIGH (ref 4.0–10.5)
nRBC: 0 % (ref 0.0–0.2)
nRBC: 0.1 % (ref 0.0–0.2)

## 2022-09-03 LAB — BASIC METABOLIC PANEL
Anion gap: 2 — ABNORMAL LOW (ref 5–15)
Anion gap: 5 (ref 5–15)
BUN: 62 mg/dL — ABNORMAL HIGH (ref 8–23)
BUN: 55 mg/dL — ABNORMAL HIGH (ref 8–23)
CO2: 25 mmol/L (ref 22–32)
CO2: 25 mmol/L (ref 22–32)
Calcium: 8.8 mg/dL — ABNORMAL LOW (ref 8.9–10.3)
Calcium: 8.7 mg/dL — ABNORMAL LOW (ref 8.9–10.3)
Chloride: 107 mmol/L (ref 98–111)
Chloride: 108 mmol/L (ref 98–111)
Creatinine, Ser: 1.33 mg/dL — ABNORMAL HIGH (ref 0.44–1.00)
Creatinine, Ser: 1.2 mg/dL — ABNORMAL HIGH (ref 0.44–1.00)
GFR, Estimated: 39 mL/min — ABNORMAL LOW (ref >60)
GFR, Estimated: 45 mL/min — ABNORMAL LOW (ref >60)
Glucose, Bld: 119 mg/dL — ABNORMAL HIGH (ref 70–99)
Glucose, Bld: 131 mg/dL — ABNORMAL HIGH (ref 70–99)
Potassium: 2.7 mmol/L — CL (ref 3.5–5.1)
Potassium: 4.1 mmol/L (ref 3.5–5.1)
Sodium: 134 mmol/L — ABNORMAL LOW (ref 135–145)
Sodium: 138 mmol/L (ref 135–145)

## 2022-09-03 LAB — MAGNESIUM: Magnesium: 1.9 mg/dL (ref 1.7–2.4)

## 2022-09-03 MED ORDER — POTASSIUM CHLORIDE CRYS ER 20 MEQ PO TBCR
40.0000 meq | EXTENDED_RELEASE_TABLET | Freq: Every day | ORAL | Status: AC
  Administered 2022-09-03 – 2022-09-04 (×2): 40 meq via ORAL
  Filled 2022-09-03 (×2): qty 2

## 2022-09-03 MED ORDER — POTASSIUM CHLORIDE 10 MEQ/100ML IV SOLN
10.0000 meq | INTRAVENOUS | Status: AC
  Administered 2022-09-03 (×3): 10 meq via INTRAVENOUS
  Filled 2022-09-03 (×3): qty 100

## 2022-09-03 MED ORDER — MAGNESIUM SULFATE 2 GM/50ML IV SOLN
2.0000 g | Freq: Once | INTRAVENOUS | Status: AC
  Administered 2022-09-03: 2 g via INTRAVENOUS
  Filled 2022-09-03: qty 50

## 2022-09-03 MED ORDER — AMLODIPINE BESYLATE 10 MG PO TABS
10.0000 mg | ORAL_TABLET | Freq: Every day | ORAL | Status: DC
  Administered 2022-09-04 – 2022-09-08 (×5): 10 mg via ORAL
  Filled 2022-09-03 (×5): qty 1

## 2022-09-03 MED ORDER — PANTOPRAZOLE 80MG IVPB - SIMPLE MED
80.0000 mg | Freq: Once | INTRAVENOUS | Status: AC
  Administered 2022-09-03: 80 mg via INTRAVENOUS
  Filled 2022-09-03: qty 100

## 2022-09-03 MED ORDER — POTASSIUM CHLORIDE 10 MEQ/100ML IV SOLN
10.0000 meq | Freq: Once | INTRAVENOUS | Status: AC
  Administered 2022-09-03: 10 meq via INTRAVENOUS

## 2022-09-03 MED ORDER — POTASSIUM CHLORIDE 10 MEQ/100ML IV SOLN
INTRAVENOUS | Status: AC
  Filled 2022-09-03: qty 100

## 2022-09-03 MED ORDER — PANTOPRAZOLE SODIUM 40 MG IV SOLR: 40.0000 mg | Freq: Two times a day (BID) | INTRAVENOUS | Status: DC

## 2022-09-03 MED ORDER — PANTOPRAZOLE INFUSION (NEW) - SIMPLE MED
8.0000 mg/h | INTRAVENOUS | Status: DC
  Administered 2022-09-03 – 2022-09-05 (×5): 8 mg/h via INTRAVENOUS
  Filled 2022-09-03 (×7): qty 100

## 2022-09-03 NOTE — TOC Benefit Eligibility Note (Signed)
Patient Advocate Encounter  Insurance verification completed.    The patient is currently admitted and upon discharge could be taking Eliquis 5 mg.  The current 30 day co-pay is $40.00.   The patient is insured through [employer] Medicare Part D   This test claim was processed through [facility]- copay amounts may vary at other pharmacies due to pharmacy/plan contracts, or as the patient moves through the different stages of their insurance plan.  Blu Mcglaun, CPHT Pharmacy Patient Advocate Specialist [facility] Patient Advocate Team Direct Number: [phone]  Fax: [phone]       

## 2022-09-03 NOTE — Telephone Encounter (Signed)
Son updated re chemo. Per Dr. Arbutus Ped ,I told [name] that if she is d/c before Friday ,she needs to keep her appt Friday for tx.

## 2022-09-03 NOTE — Progress Notes (Signed)
Ok to increase amlodipine to 10mg  qday while lisinopril/hctz is on hold per Dr. Jerral Ralph due to AKI.  Ulyses Southward, PharmD, BCIDP, AAHIVP, CPP Infectious Disease Pharmacist 09/03/2022 10:03 AM

## 2022-09-03 NOTE — TOC Progression Note (Signed)
Transition of Care [facility]) - Progression Note    Patient Details  Name: [name] MRN: [id number] Date of Birth: [birth year]  Transition of Care [facility]) CM/SW Contact  Mearl Latin, LCSW Phone Number: 09/03/2022, 9:29 AM  Clinical Narrative:    CSW spoke with patient's son, Tresa Endo, and emailed SNF bed offers to [email or web address] as requested.    Expected Discharge Plan: Skilled Nursing Facility Barriers to Discharge: Continued Medical Work up, English as a second language teacher  Expected Discharge Plan and Services In-house Referral: Clinical Social Work   Post Acute Care Choice: Skilled Nursing Facility Living arrangements for the past 2 months: Single Family Home                                       Social Determinants of Health (SDOH) Interventions SDOH Screenings   Food Insecurity: No Food Insecurity (08/31/2022)  Housing: Low Risk  (08/31/2022)  Transportation Needs: No Transportation Needs (08/31/2022)  Utilities: Not At Risk (08/31/2022)  Tobacco Use: Low Risk  (09/01/2022)    Readmission Risk Interventions    09/03/2022    9:28 AM  Readmission Risk Prevention Plan  Transportation Screening Complete  Medication Review (RN Care Manager) Complete  PCP or Specialist appointment within 3-5 days of discharge Complete  HRI or Home Care Consult Complete  SW Recovery Care/Counseling Consult Complete  Palliative Care Screening Not Applicable  Skilled Nursing Facility Complete

## 2022-09-03 NOTE — Progress Notes (Addendum)
PROGRESS NOTE        PATIENT DETAILS Name: [name] Age: 82 y.o. Sex: female Date of Birth: 21-Jul-1940 Admit Date: 08/30/2022 Admitting Physician Almon Hercules, MD ZOX:[id number], Dorris Singh, MD  Brief Summary: Patient is a 82 y.o.  female history of multiple myeloma, HTN-who presented with encephalopathy in the setting of narcotic use/AKI.  Patient was managed with supportive care with resolution of encephalopathy-improvement in renal function-however further hospital course complicated by left lower extremity DVT, worsening anemia and melanotic stools.  See below for further details.    Significant events: 5/24>> admit to [facility] for encephalopathy/AKI 5/26>> Hb 6.7-1 unit of PRBC ordered 5/27>> melanotic appearing stools 5/28>> GI consult  Significant studies: 5/25>> CXR: Chronic interstitial lung markings. 5/25>> CT head: No acute intracranial abnormality. 5/25>> renal ultrasound: No hydronephrosis 5/25>> x-ray pelvis: No acute osseous abnormality 5/25>> bilateral lower extremity Doppler: Left leg DVT involving posterior tibial/peroneal vein.  Significant microbiology data: None  Procedures: None  Consults: GI  Subjective: Lying comfortably in bed.  Objective: Vitals: Blood pressure (!) 168/56, pulse 64, temperature 98.2 F (36.8 C), temperature source Oral, resp. rate (!) 30, height 5\' 1"  (1.549 m), weight 66.2 kg, SpO2 91 %.   Exam: Gen Exam:Alert awake-not in any distress HEENT:atraumatic, normocephalic Chest: B/L clear to auscultation anteriorly CVS:S1S2 regular Abdomen:soft non tender, non distended Extremities:no edema Neurology: Non focal Skin: no rash  Pertinent Labs/Radiology:    Latest Ref Rng & Units 09/03/2022    1:36 AM 09/02/2022    3:41 AM 09/01/2022    1:12 AM  CBC  WBC 4.0 - 10.5 K/uL 12.0  11.6  12.5   Hemoglobin 12.0 - 15.0 g/dL 7.7  7.5  6.7   Hematocrit 36.0 - 46.0 % 23.5  23.0  21.2   Platelets 150 - 400 K/uL 243   223  249     Lab Results  Component Value Date   NA 134 (L) 09/03/2022   K 2.7 (LL) 09/03/2022   CL 107 09/03/2022   CO2 25 09/03/2022      Assessment/Plan: Acute toxic metabolic encephalopathy Secondary to a long-acting morphine use (recently started)/AKI Significant improvement in encephalopathy-and close to baseline.    AKI Likely hemodynamically mediated UA negative for proteinuria-renal ultrasound negative for hydronephrosis Creatinine continues to improve with just supportive care-no longer on IV fluids.  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  Melanotic stools-possible upper GI bleeding Occured on 5/27 evening Hold Eliquis Start PPI GI consulted.  Left Leg DVT Initially on IV heparin-transitioned to Eliquis on 5/27 Holding anticoagulation due to melanotic appearing stools.  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 Discussed with Dr. Modesto Charon on 5/28-continue supportive care as we are doing.  History of bioprosthetic aortic valve replacement 2015 Recent echo 7/23-showed stable prosthetic valve.  HTN BP on the higher side-increase amlodipine  Continue to hold lisinopril/HCTZ given resolving AKI.  Debility/deconditioning Due to due to illness superimposed on underlying malignancy. PT/OT  eval ongoing-initial recommendations are for home health-however updated recommendations are for CIR versus SNF.    Deep tissue pressure injury to sacrum Present prior to hospitalization Appreciate wound care input Continue wound care per wound care RN.  Pressure Injury 08/31/22 Sacrum Mid Deep Tissue Pressure Injury - Purple or maroon localized area of discolored intact skin or blood-filled blister due to damage of underlying soft tissue from pressure and/or shear. marron, nonblancable (Active)  08/31/22 1300  Location: Sacrum  Location Orientation: Mid  Staging: Deep Tissue Pressure Injury - Purple or maroon localized area of discolored intact skin or blood-filled blister due to damage of underlying soft tissue from pressure and/or shear.  Wound Description (Comments): marron, nonblancable  Present on Admission: Yes    Nutrition Status: Nutrition Problem: Severe Malnutrition Etiology: chronic illness Signs/Symptoms: percent weight loss, energy intake < 75% for > or equal to 1 month Percent weight loss: 13 % Interventions: MVI, Education, Refer to RD note for recommendations, Nepro shake    BMI: Estimated body mass index is 27.58 kg/m as calculated from the following:   Height as of this encounter: 5\' 1"  (1.549 m).   Weight as of this encounter: 66.2 kg.   Code status:   Code Status: Full Code   DVT Prophylaxis: IV heparin   Family Communication: Daughter-in-law at bedside   Disposition Plan: Status is: Inpatient Remains inpatient appropriate because: Severity of illness   Planned Discharge Destination: SNF   Diet: Diet Order             Diet renal/carb modified with fluid restriction Diet-HS Snack? Nothing; Fluid restriction: 2000 mL Fluid; Room service appropriate? Yes; Fluid consistency: Thin  Diet effective now                     Antimicrobial agents: Anti-infectives (From admission, onward)    None        MEDICATIONS: Scheduled Meds:  acetaminophen  1,000 mg Oral Q8H   [START ON 09/04/2022] amLODipine  10 mg Oral Daily   feeding supplement (NEPRO CARB  STEADY)  237 mL Oral BID BM   lidocaine  1 patch Transdermal Q24H   multivitamin  1 tablet Oral QHS   [START ON 09/06/2022] pantoprazole  40 mg Intravenous Q12H   potassium chloride  40 mEq Oral Daily   sevelamer carbonate  2.4 g Oral TID WC   sodium chloride flush  3 mL Intravenous Q12H   Continuous Infusions:  pantoprazole 8 mg/hr (09/03/22 1059)   potassium chloride 10 mEq (09/03/22 1057)   PRN Meds:.hydrALAZINE, naLOXone (NARCAN)  injection, oxyCODONE   I have personally reviewed following labs and imaging studies  LABORATORY DATA: CBC: Recent Labs  Lab 08/30/22 2348 08/31/22 0516 09/01/22 0112 09/02/22 0341 09/03/22 0136  WBC 13.5* 17.8* 12.5* 11.6* 12.0*  NEUTROABS 11.0* 13.5* 8.9*  --   --   HGB 8.2* 7.5* 6.7* 7.5* 7.7*  HCT 25.9* 24.2* 21.2* 23.0* 23.5*  MCV 95.2 94.5 92.2 90.9 92.2  PLT 315 299 249 223 243     Basic Metabolic Panel: Recent Labs  Lab 08/31/22 0516 08/31/22 1431 09/01/22 0112 09/02/22 0341 09/03/22 0136  NA 128* 130* 132* 135 134*  K 3.6 3.2* 3.0* 3.1* 2.7*  CL 95* 98 101 105 107  CO2 22 27 25 25 25   GLUCOSE 106* 115* 110* 126* 119*  BUN 57* 55* 55* 60* 62*  CREATININE 2.37* 2.08* 1.79* 1.45* 1.33*  CALCIUM 9.5 9.5 9.0 9.1 8.8*  MG 1.8  --  1.6* 1.9 1.9  PHOS >30.0* >30.0* >30.0* >30.0*  --      GFR: Estimated Creatinine Clearance: 27.4 mL/min (A) (by C-G formula based on SCr of 1.33 mg/dL (H)).  Liver Function Tests: Recent Labs  Lab 08/31/22 0031 08/31/22 0516 08/31/22 1431 09/01/22 0112  AST 55* 73*  --  51*  ALT 31 35  --  33  ALKPHOS 123 114  --  105  BILITOT 0.9 1.4*  --  0.8  PROT 11.0* 10.3*  --  9.1*  ALBUMIN <1.5* <1.5* <1.5* <1.5*    No results for input(s): "LIPASE", "AMYLASE" in the last 168 hours. Recent Labs  Lab 08/31/22 1431 09/01/22 0112  AMMONIA 20 28     Coagulation Profile: No results for input(s): "INR", "PROTIME" in the last 168 hours.  Cardiac Enzymes: Recent Labs  Lab 09/01/22 0112   CKTOTAL 119     BNP (last 3 results) No results for input(s): "PROBNP" in the last 8760 hours.  Lipid Profile: No results for input(s): "CHOL", "HDL", "LDLCALC", "TRIG", "CHOLHDL", "LDLDIRECT" in the last 72 hours.  Thyroid Function Tests: No results for input(s): "TSH", "T4TOTAL", "FREET4", "T3FREE", "THYROIDAB" in the last 72 hours.   Anemia Panel: Recent Labs    08/31/22 1431 09/01/22 0112  VITAMINB12 840 977*  FOLATE  --  5.2*  FERRITIN  --  595*  TIBC  --  NOT CALCULATED  IRON  --  83  RETICCTPCT  --  2.5     Urine analysis:    Component Value Date/Time   COLORURINE YELLOW 08/31/2022 0045   APPEARANCEUR HAZY (A) 08/31/2022 0045   LABSPEC 1.013 08/31/2022 0045   PHURINE 5.0 08/31/2022 0045   GLUCOSEU NEGATIVE 08/31/2022 0045   HGBUR MODERATE (A) 08/31/2022 0045   BILIRUBINUR NEGATIVE 08/31/2022 0045   KETONESUR 5 (A) 08/31/2022 0045   PROTEINUR NEGATIVE 08/31/2022 0045   UROBILINOGEN 0.2 10/12/2013 1117   NITRITE NEGATIVE 08/31/2022 0045   LEUKOCYTESUR NEGATIVE 08/31/2022 0045    Sepsis Labs: Lactic Acid, Venous No results found for: "LATICACIDVEN"  MICROBIOLOGY: No results found for this or any previous visit (from the past 240 hour(s)).  RADIOLOGY STUDIES/RESULTS: No results found.   LOS: 3 days   Jeoffrey Massed, MD  [facility]    To contact the attending provider between 7A-7P or the covering provider during after hours 7P- epigastrium.  Active the patient had multiple  Already on sevelamer 2.4 g failure 7A, please log into the web site [email or web address] and access using universal [place] password for that web site. If you do not have the password, please call the hospital operator.  09/03/2022, 11:20 AM   We have used sleeping calcaneal by volume minus by

## 2022-09-03 NOTE — Consult Note (Signed)
WOC Nurse Consult Note: Addendum:  I have provided guidance to obtain a mattress replacement with low air los feature as an adjunct to the POC provided previously on 08/31/22.  WOC nursing team will not follow, but will remain available to this patient, the nursing and medical teams.  Please re-consult if needed.  Ladona Mow, MSN, RN, CNS, GNP, Leda Min, [employer], [employer] phone:  [phone]

## 2022-09-03 NOTE — Care Management Important Message (Signed)
Important Message  Patient Details  Name: CASANDRA ROBERS MRN: [id number] Date of Birth: [birth year]   Medicare Important Message Given:  Yes     Kilah Drahos Stefan Church 09/03/2022, 3:46 PM

## 2022-09-03 NOTE — Consult Note (Signed)
WOC Nurse Consult Note: Discussion with son at his request for update on sacral pressure injury. Taught what deep tissue pressure injury meant, that injury occurred a likely 48-72 hours prior to presentation as a purple/maroon discoloration of the skin. He asks if this could be a result of the biopsy she had two weeks ago and I told him it was not related to that procedure, but rather an extended period of lying in bed or sitting in a chair somewhat sloughed down so that the body's weight was on the coccyx.  The son relays a time last week when she was over-medicated and in her recliner and they could not get her up to go to the bathroom. We agreed that that was more likely than not the time frame of the skin injury. Interventions in place were discussed: an air mattress has been ordered, turning and repositioning off of the affected area, a pressure redistribution chair pad while OOB in chair and topical care with an antimicrobial nonadherent gauze (xeroform) as a wound contact layer topped with dry gauze and secured with a silicone foam.  I discussed the results of my meeting with the patient's son with her Bedside RN A. Barlow via secure text. She is to relay to the NT that patient is to be turned from side to side and time in the supine position minimized.  WOC nursing team will not follow, but will remain available to this patient, the nursing and medical teams.    Thank you for inviting [place] to participate in this patient's Plan of Care.  Ladona Mow, MSN, RN, CNS, GNP, Leda Min, [employer], [employer] phone:  [phone]

## 2022-09-03 NOTE — Consult Note (Signed)
Referring Provider:  TH Primary Care Physician:  [name], MD Primary Gastroenterologist:  Deboraha Sprang PCP  Reason for Consultation:   Melena   HPI: [name] is a 82 y.o. female with past medical history of metastatic multiple myeloma, coronary artery disease, pulmonary hypertension, aortic stenosis presented to the hospital with altered mental status.  It was thought to be from narcotic pain medication as well as from AKI.  She was found to have DVT in the left lower extremity.  Was initially started on heparin drip 2 days ago.  Looks like she was transitioned to Eliquis yesterday.  Had 1 episode of melena.  GI is consulted for further evaluation.  Patient seen and examined at bedside.  Family at bedside.  Patient had another episode of black-colored stool this morning.  Patient denies any abdominal pain, nausea or vomiting.  Denies similar symptoms in the past.  She takes Advil twice a day for arthritis.  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 by echo 10/2021   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/2015   Carotid artery stenosis    < 50% stenosis bilaterally by doppler 07/2016   Coronary artery disease 2008   Coronary Ca score of 331 with minimal multivessel plaque < 25% stenosis by coronary CTA 8/23   Heart murmur    per pt   Hypercholesteremia    Hypertension    Obesity    Osteopenia    declines treatment   Pneumonia 1995   Pulmonary HTN (HCC)    mild to moderate by echo 7/23 with PASP   Shoulder pain    Due to arthritis   Vitamin D insufficiency     Past Surgical History:  Procedure Laterality Date   AORTIC VALVE REPLACEMENT N/A 10/14/2013   Procedure: AORTIC VALVE REPLACEMENT (AVR);  Surgeon: Alleen Borne, MD;  Location: [facility] OR;   Service: Open Heart Surgery;  Laterality: N/A;   CARDIAC CATHETERIZATION     CATARACT EXTRACTION, BILATERAL     CHOLECYSTECTOMY  04/08/1988   INTRAOPERATIVE TRANSESOPHAGEAL ECHOCARDIOGRAM N/A 10/14/2013   Procedure: INTRAOPERATIVE TRANSESOPHAGEAL ECHOCARDIOGRAM;  Surgeon: Alleen Borne, MD;  Location: [facility] OR;  Service: Open Heart Surgery;  Laterality: N/A;   KYPHOPLASTY Bilateral 03/27/2022   Procedure: KYPHOPLASTY AND BIOPSY THORACIC ELEVEN;  Surgeon: Lisbeth Renshaw, MD;  Location: [facility] OR;  Service: Neurosurgery;  Laterality: Bilateral;   LEFT AND RIGHT HEART CATHETERIZATION WITH CORONARY ANGIOGRAM N/A 09/16/2013   Procedure: LEFT AND RIGHT HEART CATHETERIZATION WITH CORONARY ANGIOGRAM;  Surgeon: Quintella Reichert, MD;  Location: [facility] CATH LAB;  Service: Cardiovascular;  Laterality: N/A;   TONSILLECTOMY      Prior to Admission medications   Medication Sig Start Date End Date Taking? Authorizing Provider  aspirin EC 81 MG tablet Take 81 mg by mouth daily. Swallow whole.   Yes [provider]  hydrochlorothiazide (MICROZIDE) 12.5 MG capsule Take 1 capsule (12.5 mg total) by mouth daily. 11/19/21  Yes Turner, Cornelious Bryant, MD  HYDROcodone-acetaminophen (NORCO/VICODIN) 5-325 MG tablet Take 1 tablet by mouth every 6 (six) hours as needed for severe pain. 07/30/22  Yes Plunkett, Alphonzo Lemmings, MD  lisinopril (ZESTRIL) 10 MG tablet Take 1 tablet by mouth  once daily 11/13/21  Yes Turner, Cornelious Bryant, MD  morphine (MS CONTIN) 15 MG 12 hr tablet Take 2 tablets (30 mg total) by mouth every 12 (twelve) hours. 08/21/22  Yes Si Gaul, MD  Multiple Vitamin (MULTIVITAMIN WITH MINERALS) TABS tablet Take 1 tablet by mouth daily.   Yes [provider]  prochlorperazine (COMPAZINE) 10 MG tablet Take 1 tablet (10 mg total) by mouth every 6 (six) hours as needed for nausea or vomiting. 08/21/22  Yes Si Gaul, MD  allopurinol (ZYLOPRIM) 100 MG tablet Take 1 tablet (100 mg total) by mouth 2 (two) times  daily. Patient not taking: Reported on 08/31/2022 08/27/22   Heilingoetter, Cassandra L, PA-C  dexamethasone (DECADRON) 4 MG tablet Please take 5 tablets (20 mg) weekly on the days of treatment Patient not taking: Reported on 08/31/2022 08/27/22   Heilingoetter, Cassandra L, PA-C  lenalidomide (REVLIMID) 15 MG capsule Take 1 capsule (15 mg total) by mouth daily. Take for 21 days, then hold for 7 days. Repeat every 28 days. Patient not taking: Reported on 08/31/2022 08/29/22   Si Gaul, MD    Scheduled Meds:  acetaminophen  1,000 mg Oral Q8H   [START ON 09/04/2022] amLODipine  10 mg Oral Daily   feeding supplement (NEPRO CARB STEADY)  237 mL Oral BID BM   lidocaine  1 patch Transdermal Q24H   multivitamin  1 tablet Oral QHS   [START ON 09/06/2022] pantoprazole  40 mg Intravenous Q12H   potassium chloride  40 mEq Oral Daily   sevelamer carbonate  2.4 g Oral TID WC   sodium chloride flush  3 mL Intravenous Q12H   Continuous Infusions:  pantoprazole     potassium chloride 10 mEq (09/03/22 0923)   PRN Meds:.hydrALAZINE, naLOXone (NARCAN)  injection, oxyCODONE  Allergies as of 08/30/2022 - Review Complete 08/27/2022  Allergen Reaction Noted   Crestor [rosuvastatin calcium] Other (See Comments) 04/30/2016   Lactose Other (See Comments) 08/12/2022   Lipitor [atorvastatin] Other (See Comments) 08/04/2013   Pravastatin Other (See Comments) 08/04/2013   Simvastatin Other (See Comments) 08/04/2013   Tramadol Other (See Comments) 10/22/2013   Zetia [ezetimibe]  04/16/2016   Codeine Palpitations 08/04/2013   Penicillins Palpitations 08/04/2013   Sulfa antibiotics Palpitations 08/04/2013   Vancomycin Itching and Other (See Comments) 01/28/2022    Family History  Problem Relation Age of Onset   Dementia Mother    Heart disease Father    Heart attack Father    Heart disease Brother    Heart attack Brother    Arrhythmia Sister    Heart attack Maternal Grandfather     Social History    Socioeconomic History   Marital status: Widowed    Spouse name: Not on file   Number of children: Not on file   Years of education: Not on file   Highest education level: Not on file  Occupational History   Not on file  Tobacco Use   Smoking status: Never   Smokeless tobacco: Never  Vaping Use   Vaping Use: Never used  Substance and Sexual Activity   Alcohol use: No   Drug use: No   Sexual activity: Not Currently  Other Topics Concern   Not on file  Social History Narrative   Not on file   Social Determinants of Health   Financial Resource Strain: Not on file  Food Insecurity: No Food Insecurity (08/31/2022)   Hunger Vital Sign    Worried About Running Out of Food in the  Last Year: Never true    Ran Out of Food in the Last Year: Never true  Transportation Needs: No Transportation Needs (08/31/2022)   PRAPARE - Administrator, Civil Service (Medical): No    Lack of Transportation (Non-Medical): No  Physical Activity: Not on file  Stress: Not on file  Social Connections: Not on file  Intimate Partner Violence: Not At Risk (08/31/2022)   Humiliation, Afraid, Rape, and Kick questionnaire    Fear of Current or Ex-Partner: No    Emotionally Abused: No    Physically Abused: No    Sexually Abused: No    Review of Systems: All negative except as stated above in HPI.  Physical Exam: Vital signs: Vitals:   09/03/22 0446 09/03/22 0829  BP: (!) 163/65 (!) 168/56  Pulse: 64 64  Resp: 20 (!) 30  Temp: 97.9 F (36.6 C) 98.2 F (36.8 C)  SpO2:  91%   Last BM Date : 09/02/22 General:   Elderly patient not in acute distress Lungs: No visible respiratory distress Heart:  Regular rate and rhythm; no murmurs, clicks, rubs,  or gallops. Abdomen: Soft, nontender, nondistended, bowel sounds present, no peritoneal signs Mood and affect normal Alert and oriented x 3 Rectal:  Deferred  GI:  Lab Results: Recent Labs    09/01/22 0112 09/02/22 0341 09/03/22 0136   WBC 12.5* 11.6* 12.0*  HGB 6.7* 7.5* 7.7*  HCT 21.2* 23.0* 23.5*  PLT 249 223 243   BMET Recent Labs    09/01/22 0112 09/02/22 0341 09/03/22 0136  NA 132* 135 134*  K 3.0* 3.1* 2.7*  CL 101 105 107  CO2 25 25 25   GLUCOSE 110* 126* 119*  BUN 55* 60* 62*  CREATININE 1.79* 1.45* 1.33*  CALCIUM 9.0 9.1 8.8*   LFT Recent Labs    09/01/22 0112  PROT 9.1*  ALBUMIN <1.5*  AST 51*  ALT 33  ALKPHOS 105  BILITOT 0.8   PT/INR No results for input(s): "LABPROT", "INR" in the last 72 hours.   Studies/Results: No results found.  Impression/Plan: -Melena in setting of anticoagulation use and history of NSAID use.  Likely gastritis or ulcer disease.  Patient denies any abdominal pain, nausea or vomiting.  Last dose of Eliquis yesterday. -Newly diagnosed DVT -Multiple myeloma -Pulmonary hypertension -Coronary artery disease  Recommendations ----------------------- -Patient with history of significant NSAID use with Advil twice a day.  Was recently started on anticoagulation and now having melena.  Mild drop in hemoglobin noted. -Discussed management options with patient and family at bedside.  Discussed EGD versus conservative management with PPI.  Patient and family not sure about management options for now and recommended to start treatment for ulcer disease while did discuss with other family members regarding endoscopy. -Patient with history of pulmonary hypertension and coronary artery disease as well as advanced age, somewhat at high risk for endoscopy.   LOS: 3 days   Kathi Der  MD, FACP 09/03/2022, 10:47 AM  Contact #  [phone]

## 2022-09-03 NOTE — Progress Notes (Signed)
Physical Therapy Treatment Patient Details Name: [name] MRN: [id number] DOB: 04/11/1940 Today's Date: 09/03/2022   History of Present Illness 82 year old F presenting to ED with progressive altered mental status, lethargy and poor p.o. intake and admitted for acute toxic metabolic encephalopathy likely due to pain medication and Compazine as well as AKI with azotemia and hypercalcemia.with PMH of multiple myeloma on chemo, AVR, and HTN    PT Comments    Pt greeted sidelying in bed and agreeable to session with encouragement as pt continues to be limited by sacral pain and general fatigue. Pt able to come to sitting EOB from sidelying with mod A to elevate trunk and manage BLEs to and off EOB, however pt with increased initiation able to reach for rail and initiate pushing up without cues. Pt needing min A to transfer sit<>stand and min A throughout gait with RW for support. Educated pt on importance of time up OOB and benefits of continued mobility with pt verbalizing understanding. Pt continues to benefit from skilled PT services to progress toward functional mobility goals.    Recommendations for follow up therapy are one component of a multi-disciplinary discharge planning process, led by the attending physician.  Recommendations may be updated based on patient status, additional functional criteria and insurance authorization.  Follow Up Recommendations       Assistance Recommended at Discharge Frequent or constant Supervision/Assistance  Patient can return home with the following A lot of help with walking and/or transfers;A lot of help with bathing/dressing/bathroom;Assistance with cooking/housework;Direct supervision/assist for medications management;Assist for transportation;Help with stairs or ramp for entrance   Equipment Recommendations  Other (comment) (defer to next level)    Recommendations for Other Services       Precautions / Restrictions Precautions Precautions:  Fall Precaution Comments: sacral wound Restrictions Weight Bearing Restrictions: No     Mobility  Bed Mobility Overal bed mobility: Needs Assistance Bed Mobility: Rolling, Sidelying to Sit Rolling: Mod assist Sidelying to sit: Mod assist       General bed mobility comments: increased cueing for sequencing of rolling and sidelying to sit, with hand over hand guidance for use of bed rail, modA for BLE management and trunk support, limited by pain    Transfers Overall transfer level: Needs assistance Equipment used: Rolling walker (2 wheels) Transfers: Sit to/from Stand Sit to Stand: Min assist           General transfer comment: Cueing for proper hand placement for sit<>stand, requiring minA to stand from bed    Ambulation/Gait Ambulation/Gait assistance: Min assist Gait Distance (Feet): 50 Feet Assistive device: Rolling walker (2 wheels) Gait Pattern/deviations: Step-through pattern, Decreased stride length, Knee flexed in stance - left, Knee flexed in stance - right, Trunk flexed, Drifts right/left Gait velocity: decreased     General Gait Details: minA for balance and cueing for RW management as well as navigation around obstacles. Cued for upright posture and forward gaze, requires consistent motivation for distance   Stairs             Wheelchair Mobility    Modified Rankin (Stroke Patients Only)       Balance Overall balance assessment: Needs assistance Sitting-balance support: Feet supported Sitting balance-Leahy Scale: Fair   Postural control: Posterior lean Standing balance support: Bilateral upper extremity supported, During functional activity, Reliant on assistive device for balance Standing balance-Leahy Scale: Poor Standing balance comment: reliant on RW and minA for balance  Cognition Arousal/Alertness: Awake/alert Behavior During Therapy: WFL for tasks assessed/performed Overall Cognitive Status:  Impaired/Different from baseline Area of Impairment: Attention, Following commands, Memory, Safety/judgement, Awareness, Problem solving, Orientation                 Orientation Level: Disoriented to, Situation, Time Current Attention Level: Sustained Memory: Decreased short-term memory Following Commands: Follows one step commands with increased time Safety/Judgement: Decreased awareness of deficits, Decreased awareness of safety Awareness: Intellectual Problem Solving: Slow processing, Decreased initiation, Difficulty sequencing, Requires verbal cues, Requires tactile cues General Comments: pt pleasant throughout session, but requires increased time and multimodal cues for safety with mobility and command following and motivation for participation        Exercises      General Comments General comments (skin integrity, edema, etc.): VSS on RA, pt son [name] present and supportive      Pertinent Vitals/Pain Pain Assessment Pain Assessment: Faces Faces Pain Scale: Hurts even more Pain Location: sacrum Pain Descriptors / Indicators: Grimacing, Guarding, Discomfort, Sore Pain Intervention(s): Monitored during session, Limited activity within patient's tolerance, Repositioned    Home Living                          Prior Function            PT Goals (current goals can now be found in the care plan section) Acute Rehab PT Goals PT Goal Formulation: With patient/family Time For Goal Achievement: 09/15/22 Progress towards PT goals: Progressing toward goals    Frequency    Min 3X/week      PT Plan      Co-evaluation              AM-PAC PT "6 Clicks" Mobility   Outcome Measure  Help needed turning from your back to your side while in a flat bed without using bedrails?: A Lot Help needed moving from lying on your back to sitting on the side of a flat bed without using bedrails?: A Lot Help needed moving to and from a bed to a chair (including a  wheelchair)?: A Little Help needed standing up from a chair using your arms (e.g., wheelchair or bedside chair)?: A Little Help needed to walk in hospital room?: A Little Help needed climbing 3-5 steps with a railing? : Total 6 Click Score: 14    End of Session Equipment Utilized During Treatment: Gait belt Activity Tolerance: Patient tolerated treatment well Patient left: in chair;with call bell/phone within reach;with family/visitor present Nurse Communication: Mobility status PT Visit Diagnosis: Unsteadiness on feet (R26.81);Muscle weakness (generalized) (M62.81);Difficulty in walking, not elsewhere classified (R26.2);Pain Pain - part of body:  (buttock)     Time: 0160-1093 PT Time Calculation (min) (ACUTE ONLY): 21 min  Charges:  $Gait Training: 8-22 mins                     Cobey Raineri R. PTA Acute Rehabilitation Services Office: [phone]   [name] 09/03/2022, 4:13 PM

## 2022-09-03 NOTE — Telephone Encounter (Signed)
Pt update per Gwynneth Albright stools - Pt has colonoscopy pending  pressure sore at her  BM BX site. Wound care has seen pt.  Palliative care I told him Lowella Bandy will see her at her outpt appt.

## 2022-09-03 NOTE — Progress Notes (Signed)
Speech Language Pathology Treatment: Dysphagia  Patient Details Name: TAYLOR JEREZ MRN: [id number] DOB: 10-02-1940 Today's Date: 09/03/2022 Time: 4098-1191 SLP Time Calculation (min) (ACUTE ONLY): 15 min  Assessment / Plan / Recommendation    Clinical Impression  Patient seen by SLP for skilled treatment focused on dysphagia goals. Her son was in the room as well. Patient was lying on back with eyes closed but was awake. When asked if she had eaten anything she said no because she was told she was not supposed to. Per son, patient was initially NPO for a procedure today but it was cancelled. Patient was receptive to having some vanilla Ensure. She allowed SLP to raise HOB to approximately 45% but beyond that she winced in pain and requested to be decreased. She was then able to drink liquids via straw sips; no overt s/s aspiration but patient with suspected delayed initiation of swallow as well as multiple swallows with liquid sips. SLP will continue to follow for PO toleration.   HPI HPI: EVANNA HINDMAN is an 82 y.o. female in ed.  Patient presenting with progressive lethargy and somnolence and decreased p.o. intake.  Son at bedside states that he has arrived and recently for the past few days has noticed the same.  Chart review shows a note from oncology RN stating call documentation that since Tuesday when patient started morphine and Compazine and has been sleeping all the time and she wakes up she barely opens her eyes has not been drinking or eating very well and son Onalee Hua who is staying with her has to walk her to the restroom.  Yesterday he only gave her 1 dose of 30 mg of morphine because of this.  Per oncologist patient asked to cut back the morphine to 15 mg every day as needed.  If she has worsening symptoms or unable to arouse her or her breathing slows down to go to the emergency room.  Patient's son advised to get Narcan.  In the emergency room discussed with nurse to discuss with son about  trial of low-dose Narcan and monitor for any improvement in mental status;CXR on 5/25 revealed  Chronic appearing increased interstitial lung markings with mild  left basilar atelectasis.  ST consulted for swallow evaluation; pt currently NPO.      SLP Plan  Continue with current plan of care      Recommendations for follow up therapy are one component of a multi-disciplinary discharge planning process, led by the attending physician.  Recommendations may be updated based on patient status, additional functional criteria and insurance authorization.    Recommendations  Diet recommendations: Dysphagia 3 (mechanical soft);Thin liquid Liquids provided via: Cup;Straw Medication Administration: Whole meds with liquid Supervision: Patient able to self feed;Intermittent supervision to cue for compensatory strategies Compensations: Slow rate;Small sips/bites Postural Changes and/or Swallow Maneuvers: Seated upright 90 degrees                  Oral care BID;Staff/trained caregiver to provide oral care   Intermittent Supervision/Assistance Dysphagia, unspecified (R13.10)     Continue with current plan of care   Angela Nevin, MA, CCC-SLP Speech Therapy

## 2022-09-04 DIAGNOSIS — G9340 Encephalopathy, unspecified: Secondary | ICD-10-CM

## 2022-09-04 DIAGNOSIS — C9000 Multiple myeloma not having achieved remission: Secondary | ICD-10-CM | POA: Diagnosis not present

## 2022-09-04 DIAGNOSIS — G893 Neoplasm related pain (acute) (chronic): Secondary | ICD-10-CM

## 2022-09-04 DIAGNOSIS — Z515 Encounter for palliative care: Secondary | ICD-10-CM

## 2022-09-04 DIAGNOSIS — R4182 Altered mental status, unspecified: Secondary | ICD-10-CM | POA: Diagnosis not present

## 2022-09-04 DIAGNOSIS — N179 Acute kidney failure, unspecified: Secondary | ICD-10-CM | POA: Diagnosis not present

## 2022-09-04 DIAGNOSIS — D649 Anemia, unspecified: Secondary | ICD-10-CM | POA: Diagnosis not present

## 2022-09-04 LAB — CBC
HCT: 22.3 % — ABNORMAL LOW (ref 36.0–46.0)
HCT: 23.8 % — ABNORMAL LOW (ref 36.0–46.0)
Hemoglobin: 7.4 g/dL — ABNORMAL LOW (ref 12.0–15.0)
Hemoglobin: 7.6 g/dL — ABNORMAL LOW (ref 12.0–15.0)
MCH: 30.5 pg (ref 26.0–34.0)
MCH: 29.7 pg (ref 26.0–34.0)
MCHC: 33.2 g/dL (ref 30.0–36.0)
MCHC: 31.9 g/dL (ref 30.0–36.0)
MCV: 91.8 fL (ref 80.0–100.0)
MCV: 93 fL (ref 80.0–100.0)
Platelets: 228 10*3/uL (ref 150–400)
Platelets: 263 10*3/uL (ref 150–400)
RBC: 2.43 MIL/uL — ABNORMAL LOW (ref 3.87–5.11)
RBC: 2.56 MIL/uL — ABNORMAL LOW (ref 3.87–5.11)
RDW: 16.3 % — ABNORMAL HIGH (ref 11.5–15.5)
RDW: 16.7 % — ABNORMAL HIGH (ref 11.5–15.5)
WBC: 13.2 10*3/uL — ABNORMAL HIGH (ref 4.0–10.5)
WBC: 15.9 10*3/uL — ABNORMAL HIGH (ref 4.0–10.5)
nRBC: 0.2 % (ref 0.0–0.2)
nRBC: 0.2 % (ref 0.0–0.2)

## 2022-09-04 LAB — BASIC METABOLIC PANEL
Anion gap: 4 — ABNORMAL LOW (ref 5–15)
BUN: 60 mg/dL — ABNORMAL HIGH (ref 8–23)
CO2: 27 mmol/L (ref 22–32)
Calcium: 8.6 mg/dL — ABNORMAL LOW (ref 8.9–10.3)
Chloride: 107 mmol/L (ref 98–111)
Creatinine, Ser: 1.18 mg/dL — ABNORMAL HIGH (ref 0.44–1.00)
GFR, Estimated: 46 mL/min — ABNORMAL LOW (ref >60)
Glucose, Bld: 114 mg/dL — ABNORMAL HIGH (ref 70–99)
Potassium: 3.6 mmol/L (ref 3.5–5.1)
Sodium: 138 mmol/L (ref 135–145)

## 2022-09-04 LAB — MAGNESIUM: Magnesium: 2 mg/dL (ref 1.7–2.4)

## 2022-09-04 MED ORDER — OXYCODONE HCL 5 MG PO TABS: 5.0000 mg | ORAL_TABLET | Freq: Four times a day (QID) | ORAL | Status: DC | PRN

## 2022-09-04 MED ORDER — OXYCODONE HCL 5 MG PO TABS
5.0000 mg | ORAL_TABLET | ORAL | Status: DC | PRN
  Administered 2022-09-04 – 2022-09-05 (×2): 5 mg via ORAL
  Filled 2022-09-04 (×3): qty 1

## 2022-09-04 MED ORDER — OXYCODONE HCL 5 MG PO TABS: 5.0000 mg | ORAL_TABLET | ORAL | Status: DC | PRN

## 2022-09-04 MED ORDER — DEXAMETHASONE 2 MG PO TABS
2.0000 mg | ORAL_TABLET | Freq: Every day | ORAL | Status: DC
  Administered 2022-09-05 – 2022-09-08 (×4): 2 mg via ORAL
  Filled 2022-09-04 (×4): qty 1

## 2022-09-04 NOTE — H&P (View-Only) (Signed)
[facility] Progress Note  Raziah B Duchesneau 82 y.o. 05/09/1940  CC: Melena   Subjective: Patient seen and examined at bedside.  Family and RN at bedside.  No further overt bleeding.  ROS : Afebrile, negative for chest pain   Objective: Vital signs in last 24 hours: Vitals:   09/04/22 0722 09/04/22 1013  BP: (!) 136/50 (!) 162/68  Pulse:    Resp: 20   Temp: 98.1 F (36.7 C)   SpO2:      Physical Exam: Elderly patient, resting comfortably.  Abdominal exam benign.  Lab Results: Recent Labs    09/02/22 0341 09/03/22 0136 09/03/22 1731 09/04/22 0416  NA 135 134* 138 138  K 3.1* 2.7* 4.1 3.6  CL 105 107 108 107  CO2 25 25 25 27  GLUCOSE 126* 119* 131* 114*  BUN 60* 62* 55* 60*  CREATININE 1.45* 1.33* 1.20* 1.18*  CALCIUM 9.1 8.8* 8.7* 8.6*  MG 1.9 1.9  --  2.0  PHOS >30.0*  --   --   --    No results for input(s): "AST", "ALT", "ALKPHOS", "BILITOT", "PROT", "ALBUMIN" in the last 72 hours. Recent Labs    09/03/22 1731 09/04/22 0416  WBC 14.2* 13.2*  HGB 8.3* 7.4*  HCT 25.8* 22.3*  MCV 92.8 91.8  PLT 249 228   No results for input(s): "LABPROT", "INR" in the last 72 hours.    Assessment/Plan: -Melena in setting of anticoagulation use and history of NSAID use.  Likely gastritis or ulcer disease.  Patient denies any abdominal pain, nausea or vomiting.  Last dose of Eliquis 05/27  -Newly diagnosed DVT -Multiple myeloma -Pulmonary hypertension -Coronary artery disease -History of NSAID use   Recommendations ----------------------- -Patient with ongoing drop in hemoglobin but no further melena.  Family wants to have endoscopy done.  Discussed with hospitalist.  Will plan for EGD tomorrow morning.  Continue other supportive care.  Continue PPI.  Repeat blood work in the morning.  Risks (bleeding, infection, bowel perforation that could require surgery, sedation-related changes in cardiopulmonary systems), benefits (identification and possible treatment  of source of symptoms, exclusion of certain causes of symptoms), and alternatives (watchful waiting, radiographic imaging studies, empiric medical treatment)  were explained to patient/family in detail and patient wishes to proceed.    Idelle Reimann MD, FACP 09/04/2022, 10:44 AM  Contact #  [phone]  

## 2022-09-04 NOTE — Progress Notes (Signed)
Mobility Specialist Progress Note   09/04/22 1715  Mobility  Activity Ambulated with assistance in hallway  Level of Assistance +2 (takes two people) (Chair Follow)  Assistive Device Front wheel walker  Distance Ambulated (ft) 270 ft  Activity Response Tolerated well  Mobility Referral Yes  $Mobility charge 1 Mobility  Mobility Specialist Start Time (ACUTE ONLY) 1635  Mobility Specialist Stop Time (ACUTE ONLY) 1710  Mobility Specialist Time Calculation (min) (ACUTE ONLY) 35 min   Pre Mobility:86 HR, 98% SpO2 During Mobility: 108 HR, 94% SpO2 Post Mobility: 92 HR, 92% SpO2  Received in bed c/o fatigue but agreeable. MinA to EOB, stand and during ambulation. Pt having decreased spatial awareness and requiring directional cues + tactile cues on RW. +2A to chair follow and progress ambulation but pt requiring no break throughout. Returned back to bed w/o fault, call bell in reach and bed alarm on.     Frederico Hamman Mobility Specialist Please contact via SecureChat or  Rehab office at [phone]

## 2022-09-04 NOTE — Progress Notes (Signed)
SLP Cancellation Note  Patient Details Name: [name] MRN: [id number] DOB: Dec 11, 1940   Cancelled treatment:       Reason Eval/Treat Not Completed: Patient at procedure or test/unavailable (Pt currently receiving care from RN. SLP will follow up on subsequent date.)  Yunique Dearcos I. Vear Clock, MS, CCC-SLP Neuro Diagnostic Specialist  Acute Rehabilitation Services Office number: [phone]  Scheryl Marten 09/04/2022, 3:49 PM

## 2022-09-04 NOTE — Plan of Care (Signed)

## 2022-09-04 NOTE — TOC Progression Note (Signed)
Transition of Care [facility]) - Progression Note    Patient Details  Name: ESTELLER CAMBER MRN: [id number] Date of Birth: [birth year]  Transition of Care [facility]) CM/SW Contact  Mearl Latin, LCSW Phone Number: 09/04/2022, 2:57 PM  Clinical Narrative:    CSW received email from Merrily Pew (Patient's daughter in law). CSW contacted her and discussed SNF options and process. She is going to tour facilities.    Expected Discharge Plan: Skilled Nursing Facility Barriers to Discharge: Continued Medical Work up, English as a second language teacher  Expected Discharge Plan and Services In-house Referral: Clinical Social Work   Post Acute Care Choice: Skilled Nursing Facility Living arrangements for the past 2 months: Single Family Home                                       Social Determinants of Health (SDOH) Interventions SDOH Screenings   Food Insecurity: No Food Insecurity (08/31/2022)  Housing: Low Risk  (08/31/2022)  Transportation Needs: No Transportation Needs (08/31/2022)  Utilities: Not At Risk (08/31/2022)  Tobacco Use: Low Risk  (09/01/2022)    Readmission Risk Interventions    09/03/2022    9:28 AM  Readmission Risk Prevention Plan  Transportation Screening Complete  Medication Review (RN Care Manager) Complete  PCP or Specialist appointment within 3-5 days of discharge Complete  HRI or Home Care Consult Complete  SW Recovery Care/Counseling Consult Complete  Palliative Care Screening Not Applicable  Skilled Nursing Facility Complete

## 2022-09-04 NOTE — Progress Notes (Addendum)
PROGRESS NOTE        PATIENT DETAILS Name: [name] Age: 82 y.o. Sex: female Date of Birth: [birth year] Admit Date: 08/30/2022 Admitting Physician Almon Hercules, MD ZOX:[id number], Dorris Singh, MD  Brief Summary: Patient is a 82 y.o.  female history of multiple myeloma, HTN-who presented with encephalopathy in the setting of narcotic use/AKI.  Patient was managed with supportive care with resolution of encephalopathy-improvement in renal function-however further hospital course complicated by left lower extremity DVT, worsening anemia and melanotic stools.  See below for further details.    Significant events: 5/24>> admit to [facility] for encephalopathy/AKI 5/26>> Hb 6.7-1 unit of PRBC ordered 5/27>> melanotic appearing stools 5/28>> GI consult  Significant studies: 5/25>> CXR: Chronic interstitial lung markings. 5/25>> CT head: No acute intracranial abnormality. 5/25>> renal ultrasound: No hydronephrosis 5/25>> x-ray pelvis: No acute osseous abnormality 5/25>> bilateral lower extremity Doppler: Left leg DVT involving posterior tibial/peroneal vein.  Significant microbiology data: None  Procedures: None  Consults: GI  Subjective: Numerous black tarry stools yesterday.  No chest pain or shortness of breath.  Objective: Vitals: Blood pressure (!) 136/50, pulse (!) 57, temperature 98.1 F (36.7 C), temperature source Oral, resp. rate 20, height 5\' 1"  (1.549 m), weight 65.8 kg, SpO2 90 %.   Exam: Gen Exam:Alert awake-not in any distress HEENT:atraumatic, normocephalic Chest: B/L clear to auscultation anteriorly CVS:S1S2 regular Abdomen:soft non tender, non distended Extremities:no edema Neurology: Non focal Skin: no rash  Pertinent Labs/Radiology:    Latest Ref Rng & Units 09/04/2022    4:16 AM 09/03/2022    5:31 PM 09/03/2022    1:36 AM  CBC  WBC 4.0 - 10.5 K/uL 13.2  14.2  12.0   Hemoglobin 12.0 - 15.0 g/dL 7.4  8.3  7.7   Hematocrit 36.0 - 46.0 %  22.3  25.8  23.5   Platelets 150 - 400 K/uL 228  249  243     Lab Results  Component Value Date   NA 138 09/04/2022   K 3.6 09/04/2022   CL 107 09/04/2022   CO2 27 09/04/2022      Assessment/Plan: Acute toxic metabolic encephalopathy Secondary to a long-acting morphine use (recently started)/AKI Significant improvement in encephalopathy-and close to baseline.  However some confusion overnight-suspect hospital delirium. Delirium precautions  AKI Likely hemodynamically mediated UA negative for proteinuria-renal ultrasound negative for hydronephrosis Creatinine continues to improve with just supportive care.  Hypokalemia/hypomagnesemia Repleted.  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   Melanotic stools-possible upper GI bleeding Occured on 5/27 evening-unfortunately continues to have melanotic stools Continue PPI Long discussion with patient/her young son this morning-they are okay with proceeding with EGD GI MD aware Continue to hold Eliquis Repeat CBC later this evening.  Left Leg DVT Initially on IV heparin-transitioned to Eliquis on 5/27 Holding anticoagulation due to melanotic appearing stools. May need to consider IVC filter if EGD shows high risk bleeding lesion.  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 Discussed with Dr. Shirline Frees on 5/28-continue  supportive care as we are doing.  He has no further recommendations.  History of bioprosthetic aortic valve replacement 2015 Recent echo 7/23-showed stable prosthetic valve.  HTN BP stable on amlodipine Continue to hold lisinopril/HCTZ given resolving AKI. No beta-blocker due to history of bradycardia.  Debility/deconditioning Due to due to illness superimposed on underlying malignancy. PT/OT eval ongoing-initial recommendations are for home health-however updated recommendations are for CIR versus SNF.   Deep tissue pressure injury to sacrum Present prior to hospitalization Appreciate wound care input Continue wound care per wound care RN.  Pressure Injury 08/31/22 Sacrum Mid Deep Tissue Pressure Injury - Purple or maroon localized area of discolored intact skin or blood-filled blister due to damage of underlying soft tissue from pressure and/or shear. marron, nonblancable (Active)  08/31/22 1300  Location: Sacrum  Location Orientation: Mid  Staging: Deep Tissue Pressure Injury - Purple or maroon localized area of discolored intact skin or blood-filled blister due to damage of underlying soft tissue from pressure and/or shear.  Wound Description (Comments): marron, nonblancable  Present on Admission: Yes    Nutrition Status: Nutrition Problem: Severe Malnutrition Etiology: chronic illness Signs/Symptoms: percent weight loss, energy intake < 75% for > or equal to 1 month Percent weight loss: 13 % Interventions: MVI, Education, Refer to RD note for recommendations, Nepro shake    BMI: Estimated body mass index is 27.41 kg/m as calculated from the following:   Height as of this encounter: 5\' 1"  (1.549 m).   Weight as of this encounter: 65.8 kg.   Code status:   Code Status: DNR discussed with son/patient-DNR-on 5/29.  DVT Prophylaxis: Eliquis on hold due to upper GI bleeding.   Family Communication: Son at bedside.   Disposition Plan: Status is: Inpatient Remains  inpatient appropriate because: Severity of illness   Planned Discharge Destination: SNF   Diet: Diet Order             Diet renal/carb modified with fluid restriction Diet-HS Snack? Nothing; Fluid restriction: 2000 mL Fluid; Room service appropriate? Yes; Fluid consistency: Thin  Diet effective now                     Antimicrobial agents: Anti-infectives (From admission, onward)    None        MEDICATIONS: Scheduled Meds:  acetaminophen  1,000 mg Oral Q8H   amLODipine  10 mg Oral Daily   feeding supplement (NEPRO CARB STEADY)  237 mL Oral BID BM   lidocaine  1 patch Transdermal Q24H   multivitamin  1 tablet Oral QHS   [START ON 09/06/2022] pantoprazole  40 mg Intravenous Q12H   potassium chloride  40 mEq Oral Daily   sevelamer carbonate  2.4 g Oral TID WC   sodium chloride flush  3 mL Intravenous Q12H   Continuous Infusions:  pantoprazole 8 mg/hr (09/04/22 0538)   PRN Meds:.hydrALAZINE, naLOXone (NARCAN)  injection, oxyCODONE   I have personally reviewed following labs and imaging studies  LABORATORY DATA: CBC: Recent Labs  Lab 08/30/22 2348 08/31/22 0516 09/01/22 0112 09/02/22 0341 09/03/22 0136 09/03/22 1731 09/04/22 0416  WBC 13.5* 17.8* 12.5* 11.6* 12.0* 14.2* 13.2*  NEUTROABS 11.0* 13.5* 8.9*  --   --   --   --   HGB 8.2* 7.5* 6.7* 7.5* 7.7* 8.3* 7.4*  HCT 25.9* 24.2* 21.2* 23.0* 23.5* 25.8* 22.3*  MCV 95.2 94.5 92.2 90.9 92.2  92.8 91.8  PLT 315 299 249 223 243 249 228    Basic Metabolic Panel: Recent Labs  Lab 08/31/22 0516 08/31/22 1431 09/01/22 0112 09/02/22 0341 09/03/22 0136 09/03/22 1731 09/04/22 0416  NA 128* 130* 132* 135 134* 138 138  K 3.6 3.2* 3.0* 3.1* 2.7* 4.1 3.6  CL 95* 98 101 105 107 108 107  CO2 22 27 25 25 25 25 27   GLUCOSE 106* 115* 110* 126* 119* 131* 114*  BUN 57* 55* 55* 60* 62* 55* 60*  CREATININE 2.37* 2.08* 1.79* 1.45* 1.33* 1.20* 1.18*  CALCIUM 9.5 9.5 9.0 9.1 8.8* 8.7* 8.6*  MG 1.8  --  1.6* 1.9 1.9   --  2.0  PHOS >30.0* >30.0* >30.0* >30.0*  --   --   --     GFR: Estimated Creatinine Clearance: 30.8 mL/min (A) (by C-G formula based on SCr of 1.18 mg/dL (H)).  Liver Function Tests: Recent Labs  Lab 08/31/22 0031 08/31/22 0516 08/31/22 1431 09/01/22 0112  AST 55* 73*  --  51*  ALT 31 35  --  33  ALKPHOS 123 114  --  105  BILITOT 0.9 1.4*  --  0.8  PROT 11.0* 10.3*  --  9.1*  ALBUMIN <1.5* <1.5* <1.5* <1.5*   No results for input(s): "LIPASE", "AMYLASE" in the last 168 hours. Recent Labs  Lab 08/31/22 1431 09/01/22 0112  AMMONIA 20 28    Coagulation Profile: No results for input(s): "INR", "PROTIME" in the last 168 hours.  Cardiac Enzymes: Recent Labs  Lab 09/01/22 0112  CKTOTAL 119    BNP (last 3 results) No results for input(s): "PROBNP" in the last 8760 hours.  Lipid Profile: No results for input(s): "CHOL", "HDL", "LDLCALC", "TRIG", "CHOLHDL", "LDLDIRECT" in the last 72 hours.  Thyroid Function Tests: No results for input(s): "TSH", "T4TOTAL", "FREET4", "T3FREE", "THYROIDAB" in the last 72 hours.   Anemia Panel: No results for input(s): "VITAMINB12", "FOLATE", "FERRITIN", "TIBC", "IRON", "RETICCTPCT" in the last 72 hours.   Urine analysis:    Component Value Date/Time   COLORURINE YELLOW 08/31/2022 0045   APPEARANCEUR HAZY (A) 08/31/2022 0045   LABSPEC 1.013 08/31/2022 0045   PHURINE 5.0 08/31/2022 0045   GLUCOSEU NEGATIVE 08/31/2022 0045   HGBUR MODERATE (A) 08/31/2022 0045   BILIRUBINUR NEGATIVE 08/31/2022 0045   KETONESUR 5 (A) 08/31/2022 0045   PROTEINUR NEGATIVE 08/31/2022 0045   UROBILINOGEN 0.2 10/12/2013 1117   NITRITE NEGATIVE 08/31/2022 0045   LEUKOCYTESUR NEGATIVE 08/31/2022 0045    Sepsis Labs: Lactic Acid, Venous No results found for: "LATICACIDVEN"  MICROBIOLOGY: No results found for this or any previous visit (from the past 240 hour(s)).  RADIOLOGY STUDIES/RESULTS: No results found.   LOS: 4 days   Jeoffrey Massed,  MD  [facility]    To contact the attending provider between 7A-7P or the covering provider during after hours 7P- epigastrium.  Active the patient had multiple  Already on sevelamer 2.4 g failure 7A, please log into the web site [email or web address] and access using universal [place] password for that web site. If you do not have the password, please call the hospital operator.  09/04/2022, 10:05 AM   We have used sleeping calcaneal by volume minus by

## 2022-09-04 NOTE — Progress Notes (Signed)
Occupational Therapy Treatment Patient Details Name: [name] MRN: [id number] DOB: 04-25-1940 Today's Date: 09/04/2022   History of present illness 82 year old F presenting to ED with progressive altered mental status, lethargy and poor p.o. intake and admitted for acute toxic metabolic encephalopathy likely due to pain medication and Compazine as well as AKI with azotemia and hypercalcemia.with PMH of multiple myeloma on chemo, AVR, and HTN   OT comments  Pt requiring extensive assist for supine to sit. Ambulated to sink with RW with min guard assist. Stood x 4 1/2 minutes while engaged in grooming. Returned to bed with mod assist, rolled with rail for placement of wedge to offload sacrum. VSS throughout session. Updated d/c to inpatient rehab < 3hours a day.   Recommendations for follow up therapy are one component of a multi-disciplinary discharge planning process, led by the attending physician.  Recommendations may be updated based on patient status, additional functional criteria and insurance authorization.    Assistance Recommended at Discharge Frequent or constant Supervision/Assistance  Patient can return home with the following  A little help with walking and/or transfers;A lot of help with bathing/dressing/bathroom;Assistance with cooking/housework;Direct supervision/assist for medications management;Direct supervision/assist for financial management;Assist for transportation;Help with stairs or ramp for entrance   Equipment Recommendations  BSC/3in1    Recommendations for Other Services      Precautions / Restrictions Precautions Precautions: Fall Precaution Comments: sacral wound Restrictions Weight Bearing Restrictions: No       Mobility Bed Mobility Overal bed mobility: Needs Assistance Bed Mobility: Supine to Sit     Supine to sit: Total assist Sit to supine: Mod assist   General bed mobility comments: assist for all aspects due to pain for supine to sit,  assist for LEs back into bed, pt rolled Mod I for placement of wedge    Transfers Overall transfer level: Needs assistance Equipment used: Rolling walker (2 wheels) Transfers: Sit to/from Stand Sit to Stand: Min assist           General transfer comment: cues for hand placement, assist to rise and steady     Balance Overall balance assessment: Needs assistance   Sitting balance-Leahy Scale: Fair     Standing balance support: Single extremity supported Standing balance-Leahy Scale: Poor Standing balance comment: poor dynamic, min guard assist at sink stabilizing with one hand                           ADL either performed or assessed with clinical judgement   ADL Overall ADL's : Needs assistance/impaired     Grooming: Oral care;Standing;Min guard Grooming Details (indicate cue type and reason): stood at sink                             Functional mobility during ADLs: Min guard;Rolling walker (2 wheels)      Extremity/Trunk Assessment              Vision       Perception     Praxis      Cognition Arousal/Alertness: Awake/alert Behavior During Therapy: Flat affect Overall Cognitive Status: Impaired/Different from baseline Area of Impairment: Attention, Following commands, Memory, Safety/judgement, Awareness, Problem solving, Orientation                   Current Attention Level: Sustained Memory: Decreased short-term memory Following Commands: Follows one step commands with increased time Safety/Judgement: Decreased awareness  of deficits, Decreased awareness of safety   Problem Solving: Slow processing, Decreased initiation, Difficulty sequencing, Requires verbal cues, Requires tactile cues General Comments: pt needing encouragement for OOB        Exercises      Shoulder Instructions       General Comments      Pertinent Vitals/ Pain       Pain Assessment Pain Assessment: Faces Faces Pain Scale: Hurts even  more Pain Location: sacrum Pain Descriptors / Indicators: Grimacing, Guarding, Discomfort, Sore Pain Intervention(s): Monitored during session, Repositioned  Home Living                                          Prior Functioning/Environment              Frequency  Min 2X/week        Progress Toward Goals  OT Goals(current goals can now be found in the care plan section)  Progress towards OT goals: Progressing toward goals  Acute Rehab OT Goals OT Goal Formulation: With patient Time For Goal Achievement: 09/14/22 Potential to Achieve Goals: Good  Plan Discharge plan needs to be updated    Co-evaluation                 AM-PAC OT "6 Clicks" Daily Activity     Outcome Measure   Help from another person eating meals?: A Little Help from another person taking care of personal grooming?: A Little Help from another person toileting, which includes using toliet, bedpan, or urinal?: Total Help from another person bathing (including washing, rinsing, drying)?: A Lot Help from another person to put on and taking off regular upper body clothing?: A Little Help from another person to put on and taking off regular lower body clothing?: Total 6 Click Score: 13    End of Session    OT Visit Diagnosis: Unsteadiness on feet (R26.81);Other abnormalities of gait and mobility (R26.89);Muscle weakness (generalized) (M62.81);Pain;Other symptoms and signs involving cognitive function   Activity Tolerance Patient limited by pain;Patient limited by fatigue   Patient Left in bed;with call bell/phone within reach;with family/visitor present   Nurse Communication          Time: 1610-9604 OT Time Calculation (min): 35 min  Charges: OT General Charges $OT Visit: 1 Visit OT Treatments $Self Care/Home Management : 23-37 mins  Berna Spare, OTR/L Acute Rehabilitation Services Office: [phone]  Evern Bio 09/04/2022, 3:34 PM

## 2022-09-04 NOTE — Progress Notes (Signed)
[facility] Gastroenterology Progress Note  OSMARY SCHLEIFER 82 y.o. 06-10-1940  CC: Melena   Subjective: Patient seen and examined at bedside.  Family and RN at bedside.  No further overt bleeding.  ROS : Afebrile, negative for chest pain   Objective: Vital signs in last 24 hours: Vitals:   09/04/22 0722 09/04/22 1013  BP: (!) 136/50 (!) 162/68  Pulse:    Resp: 20   Temp: 98.1 F (36.7 C)   SpO2:      Physical Exam: Elderly patient, resting comfortably.  Abdominal exam benign.  Lab Results: Recent Labs    09/02/22 0341 09/03/22 0136 09/03/22 1731 09/04/22 0416  NA 135 134* 138 138  K 3.1* 2.7* 4.1 3.6  CL 105 107 108 107  CO2 25 25 25 27   GLUCOSE 126* 119* 131* 114*  BUN 60* 62* 55* 60*  CREATININE 1.45* 1.33* 1.20* 1.18*  CALCIUM 9.1 8.8* 8.7* 8.6*  MG 1.9 1.9  --  2.0  PHOS >30.0*  --   --   --    No results for input(s): "AST", "ALT", "ALKPHOS", "BILITOT", "PROT", "ALBUMIN" in the last 72 hours. Recent Labs    09/03/22 1731 09/04/22 0416  WBC 14.2* 13.2*  HGB 8.3* 7.4*  HCT 25.8* 22.3*  MCV 92.8 91.8  PLT 249 228   No results for input(s): "LABPROT", "INR" in the last 72 hours.    Assessment/Plan: -Melena in setting of anticoagulation use and history of NSAID use.  Likely gastritis or ulcer disease.  Patient denies any abdominal pain, nausea or vomiting.  Last dose of Eliquis 05/27  -Newly diagnosed DVT -Multiple myeloma -Pulmonary hypertension -Coronary artery disease -History of NSAID use   Recommendations ----------------------- -Patient with ongoing drop in hemoglobin but no further melena.  Family wants to have endoscopy done.  Discussed with hospitalist.  Will plan for EGD tomorrow morning.  Continue other supportive care.  Continue PPI.  Repeat blood work in the morning.  Risks (bleeding, infection, bowel perforation that could require surgery, sedation-related changes in cardiopulmonary systems), benefits (identification and possible treatment  of source of symptoms, exclusion of certain causes of symptoms), and alternatives (watchful waiting, radiographic imaging studies, empiric medical treatment)  were explained to patient/family in detail and patient wishes to proceed.    Kathi Der MD, FACP 09/04/2022, 10:44 AM  Contact #  [phone]

## 2022-09-04 NOTE — Consult Note (Cosign Needed Addendum)
Consultation Note Date: 09/04/2022   Patient Name: [name]  DOB: [birth year]  MRN: [id number]  Age / Sex: 82 y.o., female  PCP: [name], MD Referring Physician: Maretta Bees, MD  Reason for Consultation: Establishing goals of care, Pain control, and Psychosocial/spiritual support  HPI/Patient Profile: 82 y.o. female   admitted on 08/30/2022 with  history of multiple myeloma/diagnosed in May 2024, HTN-who presented with encephalopathy in the setting of narcotic use/AKI.   Patient was managed with supportive care with resolution of encephalopathy-improvement in renal function-however further hospital course complicated by left lower extremity DVT, worsening anemia and melanotic stools.   Significant events: 5/24>> admit to [facility] for encephalopathy/AKI 5/26>> Hb 6.7-1 unit of PRBC ordered 5/27>> melanotic appearing stools 5/28>> GI consult   Patient and family face treatment option decisions, advanced directive decisions and anticipatory care needs.  Clinical Assessment and Goals of Care:  This NP Lorinda Creed reviewed medical records, received report from team, assessed the patient and then meet at the patient's bedside along with her  three sons,  to discuss diagnosis, prognosis, GOC,  and options.   Concept of Palliative Care was introduced as specialized medical care for people and their families living with serious illness.  If focuses on providing relief from the symptoms and stress of a serious illness.  The goal is to improve quality of life for both the patient and the family.  Created space and opportunity for patient  and family to explore thoughts and feelings regarding current medical situation, patient and family understand the seriousness of her current medical situation.    Values and goals of care important to patient and family were attempted to be elicited.   Patient is open  and hopeful for treatment for her newly diagnosed multiple myeloma at [facility].  They understand the patient needs to stabilize from current acute hospitalization events.  Will need to schedule follow-up appointment with Dr. Shirline Frees and outpatient palliative medicine clinic on discharge   A  discussion was had today regarding advanced directives.  Concepts specific to code status, artifical feeding and hydration, continued IV antibiotics and rehospitalization was had.  The difference between a aggressive medical intervention path  and a palliative comfort care path for this patient at this time was had.   MOST form introduced, Hard Choices left for review      Questions and concerns addressed.  Patient  encouraged to call with questions or concerns.     PMT will continue to support holistically.    Patient has H POA and living will documents in [place].  All 3 sons are listed.  Copied and scanned for EMR     SUMMARY OF RECOMMENDATIONS    Code Status/Advance Care Planning: DNR-previously documented   Symptom Management:  Bone pain:             - Tylenol 1000 mg every 8 hours scheduled            - Continue with Oxycodone  5 mg po every  4 hrs prn            - Add dexamethasone 2 mg oral daily in the a.m.  Palliative Prophylaxis:  Bowel Regimen, Delirium Protocol, Frequent Pain Assessment, and Oral Care  Additional Recommendations (Limitations, Scope, Preferences): Full Scope Treatment  Psycho-social/Spiritual:  Desire for further Chaplaincy support:no   Prognosis:  Unable to determine  Discharge Planning:      -Patient is open to SNF for short-term rehab, when medically stable for discharge       Primary Diagnoses: Present on Admission:  Essential hypertension, benign  Multiple myeloma (HCC)  Acute renal failure superimposed on stage 3a chronic kidney disease (HCC)  Pathological fracture of thoracic vertebra  AMS (altered mental status)  Electrolyte abnormality   Anemia  Acute encephalopathy   I have reviewed the medical record, interviewed the patient and family, and examined the patient. The following aspects are pertinent.  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 by echo 10/2021   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/2015   Carotid artery stenosis    < 50% stenosis bilaterally by doppler 07/2016   Coronary artery disease 2008   Coronary Ca score of 331 with minimal multivessel plaque < 25% stenosis by coronary CTA 8/23   Heart murmur    per pt   Hypercholesteremia    Hypertension    Obesity    Osteopenia    declines treatment   Pneumonia 1995   Pulmonary HTN (HCC)    mild to moderate by echo 7/23 with PASP   Shoulder pain    Due to arthritis   Vitamin D insufficiency    Social History   Socioeconomic History   Marital status: Widowed    Spouse name: Not on file   Number of children: Not on file   Years of education: Not on file   Highest education level: Not on file  Occupational History   Not on file  Tobacco Use   Smoking status: Never   Smokeless tobacco: Never  Vaping Use   Vaping Use: Never used  Substance and Sexual Activity   Alcohol use: No   Drug use: No   Sexual activity: Not Currently  Other Topics Concern   Not on file  Social History Narrative   Not on file   Social Determinants of Health   Financial Resource Strain: Not on file  Food Insecurity: No Food Insecurity (08/31/2022)   Hunger Vital Sign    Worried About Running Out of Food in the Last Year: Never true    Ran Out of Food in the Last Year: Never true  Transportation Needs: No Transportation Needs (08/31/2022)   PRAPARE - Administrator, Civil Service (Medical): No    Lack of  Transportation (Non-Medical): No  Physical Activity: Not on file  Stress: Not on file  Social Connections: Not on file   Family History  Problem Relation Age of Onset   Dementia Mother    Heart disease Father    Heart attack Father    Heart disease Brother    Heart attack Brother    Arrhythmia Sister    Heart attack Maternal Grandfather    Scheduled Meds:  acetaminophen  1,000 mg Oral Q8H   amLODipine  10 mg Oral Daily   feeding supplement (NEPRO CARB STEADY)  237 mL Oral BID BM   lidocaine  1 patch Transdermal Q24H   multivitamin  1 tablet Oral QHS   [START ON 09/06/2022] pantoprazole  40 mg Intravenous Q12H   sevelamer carbonate  2.4 g Oral TID WC   sodium chloride flush  3 mL Intravenous Q12H   Continuous Infusions:  pantoprazole 8 mg/hr (09/04/22 0538)   PRN Meds:.hydrALAZINE, naLOXone (NARCAN)  injection, oxyCODONE Medications Prior to Admission:  Prior to Admission medications   Medication Sig Start Date End Date Taking? Authorizing Provider  aspirin EC 81 MG tablet Take 81 mg by mouth daily. Swallow whole.   Yes [provider]  hydrochlorothiazide (MICROZIDE) 12.5 MG capsule Take 1 capsule (12.5 mg total) by mouth daily. 11/19/21  Yes Turner, Cornelious Bryant, MD  HYDROcodone-acetaminophen (NORCO/VICODIN) 5-325 MG tablet Take 1 tablet by mouth every 6 (six) hours as needed for severe pain. 07/30/22  Yes Plunkett, Alphonzo Lemmings, MD  lisinopril (ZESTRIL) 10 MG tablet Take 1 tablet by mouth once daily 11/13/21  Yes Turner, Traci R, MD  morphine (MS CONTIN) 15 MG 12 hr tablet Take 2 tablets (30 mg total) by mouth every 12 (twelve) hours. 08/21/22  Yes Si Gaul, MD  Multiple Vitamin (MULTIVITAMIN WITH MINERALS) TABS tablet Take 1 tablet by mouth daily.   Yes [provider]  prochlorperazine (COMPAZINE) 10 MG tablet Take 1 tablet (10 mg total) by mouth every 6 (six) hours as needed for nausea or vomiting. 08/21/22  Yes Si Gaul, MD  allopurinol (ZYLOPRIM) 100 MG  tablet Take 1 tablet (100 mg total) by mouth 2 (two) times daily. Patient not taking: Reported on 08/31/2022 08/27/22   Heilingoetter, Cassandra L, PA-C  dexamethasone (DECADRON) 4 MG tablet Please take 5 tablets (20 mg) weekly on the days of treatment Patient not taking: Reported on 08/31/2022 08/27/22   Heilingoetter, Cassandra L, PA-C  lenalidomide (REVLIMID) 15 MG capsule Take 1 capsule (15 mg total) by mouth daily. Take for 21 days, then hold for 7 days. Repeat every 28 days. Patient not taking: Reported on 08/31/2022 08/29/22   Si Gaul, MD  naloxone [facility]) nasal spray 4 mg/0.1 mL SMARTSIG:1 Both Nares Daily 08/30/22   [provider]   Allergies  Allergen Reactions   Crestor [Rosuvastatin Calcium] Other (See Comments)    Myalgias with 5mg  daily dose   Lactose Other (See Comments)   Lipitor [Atorvastatin] Other (See Comments)    Stomach pain   Pravastatin Other (See Comments)    Myalgias    Simvastatin Other (See Comments)    Myalgias    Tramadol Other (See Comments)    FEELS LOOPY   Zetia [Ezetimibe]     Myalgias   Codeine Palpitations   Penicillins Palpitations   Sulfa Antibiotics Palpitations   Vancomycin Itching and Other (See Comments)    Pt developed flushing and itchy scalp greater than into the infusion.  RedMan syndrone developed.  Will need slower admin rate for future infusions.   Review of Systems  Constitutional:  Positive for fatigue.  Musculoskeletal:  Positive for back pain.  Neurological:  Positive for weakness.    Physical Exam Cardiovascular:     Rate and Rhythm: Normal rate.  Pulmonary:     Effort: Pulmonary effort is normal.  Skin:    General: Skin is warm and dry.     Comments: Noted  sacral skin  breakdown per [facility]  Neurological:     Mental Status: She is alert.     Vital Signs: BP (!) 138/52 (BP Location: Right Arm)   Pulse (!) 56   Temp 98 F (36.7 C) (Oral)   Resp (!) 25   Ht 5\' 1"  (1.549 m)   Wt 65.8 kg   SpO2  90%   BMI 27.41 kg/m  Pain Scale: 0-10   Pain Score: Asleep   SpO2: SpO2: 90 % O2 Device:SpO2: 90 % O2 Flow Rate: .O2 Flow Rate (L/min): 2 L/min  IO: Intake/output summary:  Intake/Output Summary (Last 24 hours) at 09/04/2022 1142 Last data filed at 09/04/2022 1028 Gross per 24 hour  Intake 177.62 ml  Output 625 ml  Net -447.38 ml    LBM: Last BM Date : 09/03/22 Baseline Weight: Weight: 64.3 kg Most recent weight: Weight: 65.8 kg     Palliative Assessment/Data:   40 %     Time :  90 minutes   Signed by: Lorinda Creed, NP   Please contact Palliative Medicine Team phone at [phone] for questions and concerns.  For individual provider: See Loretha Stapler

## 2022-09-05 ENCOUNTER — Other Ambulatory Visit: Payer: Self-pay | Admitting: Internal Medicine

## 2022-09-05 ENCOUNTER — Inpatient Hospital Stay: Payer: Medicare PPO | Admitting: Nurse Practitioner

## 2022-09-05 ENCOUNTER — Telehealth: Payer: Self-pay | Admitting: Medical Oncology

## 2022-09-05 ENCOUNTER — Inpatient Hospital Stay: Payer: Medicare PPO

## 2022-09-05 ENCOUNTER — Inpatient Hospital Stay (HOSPITAL_COMMUNITY): Payer: Medicare PPO | Admitting: Certified Registered"

## 2022-09-05 ENCOUNTER — Encounter (HOSPITAL_COMMUNITY): Payer: Self-pay | Admitting: Student

## 2022-09-05 ENCOUNTER — Encounter (HOSPITAL_COMMUNITY)
Admission: EM | Disposition: A | Discharge status: Skilled Nursing Facility | Payer: Self-pay | Source: Home / Self Care | Attending: Internal Medicine

## 2022-09-05 DIAGNOSIS — N179 Acute kidney failure, unspecified: Secondary | ICD-10-CM | POA: Diagnosis not present

## 2022-09-05 DIAGNOSIS — M898X9 Other specified disorders of bone, unspecified site: Secondary | ICD-10-CM

## 2022-09-05 DIAGNOSIS — I82442 Acute embolism and thrombosis of left tibial vein: Secondary | ICD-10-CM

## 2022-09-05 DIAGNOSIS — K208 Other esophagitis without bleeding: Secondary | ICD-10-CM

## 2022-09-05 DIAGNOSIS — K3189 Other diseases of stomach and duodenum: Secondary | ICD-10-CM

## 2022-09-05 DIAGNOSIS — R531 Weakness: Secondary | ICD-10-CM | POA: Diagnosis not present

## 2022-09-05 DIAGNOSIS — D649 Anemia, unspecified: Secondary | ICD-10-CM

## 2022-09-05 DIAGNOSIS — K2970 Gastritis, unspecified, without bleeding: Secondary | ICD-10-CM

## 2022-09-05 DIAGNOSIS — R4182 Altered mental status, unspecified: Secondary | ICD-10-CM | POA: Diagnosis not present

## 2022-09-05 DIAGNOSIS — C9000 Multiple myeloma not having achieved remission: Secondary | ICD-10-CM

## 2022-09-05 DIAGNOSIS — I2510 Atherosclerotic heart disease of native coronary artery without angina pectoris: Secondary | ICD-10-CM

## 2022-09-05 DIAGNOSIS — I10 Essential (primary) hypertension: Secondary | ICD-10-CM

## 2022-09-05 HISTORY — PX: ESOPHAGOGASTRODUODENOSCOPY (EGD) WITH PROPOFOL: SHX5813

## 2022-09-05 LAB — TYPE AND SCREEN: Unit division: 0

## 2022-09-05 LAB — CBC
HCT: 22.7 % — ABNORMAL LOW (ref 36.0–46.0)
Hemoglobin: 7.5 g/dL — ABNORMAL LOW (ref 12.0–15.0)
MCH: 30.1 pg (ref 26.0–34.0)
MCHC: 33 g/dL (ref 30.0–36.0)
MCV: 91.2 fL (ref 80.0–100.0)
Platelets: 228 10*3/uL (ref 150–400)
RBC: 2.49 MIL/uL — ABNORMAL LOW (ref 3.87–5.11)
RDW: 16.6 % — ABNORMAL HIGH (ref 11.5–15.5)
WBC: 15.7 10*3/uL — ABNORMAL HIGH (ref 4.0–10.5)
nRBC: 0.4 % — ABNORMAL HIGH (ref 0.0–0.2)

## 2022-09-05 LAB — BASIC METABOLIC PANEL
Anion gap: 3 — ABNORMAL LOW (ref 5–15)
BUN: 59 mg/dL — ABNORMAL HIGH (ref 8–23)
CO2: 27 mmol/L (ref 22–32)
Calcium: 8.5 mg/dL — ABNORMAL LOW (ref 8.9–10.3)
Chloride: 112 mmol/L — ABNORMAL HIGH (ref 98–111)
Creatinine, Ser: 1.16 mg/dL — ABNORMAL HIGH (ref 0.44–1.00)
GFR, Estimated: 46 mL/min — ABNORMAL LOW (ref >60)
Glucose, Bld: 114 mg/dL — ABNORMAL HIGH (ref 70–99)
Potassium: 4.1 mmol/L (ref 3.5–5.1)
Sodium: 142 mmol/L (ref 135–145)

## 2022-09-05 LAB — PREPARE RBC (CROSSMATCH)

## 2022-09-05 LAB — MAGNESIUM: Magnesium: 1.8 mg/dL (ref 1.7–2.4)

## 2022-09-05 LAB — BPAM RBC: Blood Product Expiration Date: 202406252359

## 2022-09-05 SURGERY — ESOPHAGOGASTRODUODENOSCOPY (EGD) WITH PROPOFOL
Anesthesia: Monitor Anesthesia Care

## 2022-09-05 MED ORDER — ACETAMINOPHEN 325 MG PO TABS
650.0000 mg | ORAL_TABLET | Freq: Once | ORAL | Status: AC
  Administered 2022-09-05: 650 mg via ORAL
  Filled 2022-09-05: qty 2

## 2022-09-05 MED ORDER — KETAMINE HCL 50 MG/5ML IJ SOSY
PREFILLED_SYRINGE | INTRAMUSCULAR | Status: AC
  Filled 2022-09-05: qty 5

## 2022-09-05 MED ORDER — PANTOPRAZOLE SODIUM 40 MG IV SOLR
40.0000 mg | Freq: Two times a day (BID) | INTRAVENOUS | Status: DC
  Administered 2022-09-05 – 2022-09-07 (×5): 40 mg via INTRAVENOUS
  Filled 2022-09-05 (×6): qty 10

## 2022-09-05 MED ORDER — PROPOFOL 500 MG/50ML IV EMUL
INTRAVENOUS | Status: DC | PRN
  Administered 2022-09-05: 60 ug/kg/min via INTRAVENOUS

## 2022-09-05 MED ORDER — KETAMINE HCL 10 MG/ML IJ SOLN
INTRAMUSCULAR | Status: DC | PRN
  Administered 2022-09-05: 15 mg via INTRAVENOUS

## 2022-09-05 MED ORDER — LACTATED RINGERS IV SOLN: INTRAVENOUS | Status: DC

## 2022-09-05 MED ORDER — PANTOPRAZOLE SODIUM 40 MG IV SOLR: 40.0000 mg | Freq: Two times a day (BID) | INTRAVENOUS | Status: DC

## 2022-09-05 MED ORDER — SODIUM CHLORIDE 0.9% IV SOLUTION: Freq: Once | INTRAVENOUS | Status: AC

## 2022-09-05 MED ORDER — VASOPRESSIN 20 UNIT/ML IV SOLN
INTRAVENOUS | Status: AC
  Filled 2022-09-05: qty 2

## 2022-09-05 MED ORDER — SUCRALFATE 1 GM/10ML PO SUSP: 1.0000 g | Freq: Three times a day (TID) | ORAL | Status: DC

## 2022-09-05 MED ORDER — LIDOCAINE 2% (20 MG/ML) 5 ML SYRINGE
INTRAMUSCULAR | Status: DC | PRN
  Administered 2022-09-05: 80 mg via INTRAVENOUS

## 2022-09-05 MED ORDER — SUCRALFATE 1 GM/10ML PO SUSP
1.0000 g | Freq: Three times a day (TID) | ORAL | Status: DC
  Administered 2022-09-05 – 2022-09-08 (×13): 1 g via ORAL
  Filled 2022-09-05 (×15): qty 10

## 2022-09-05 MED ORDER — DIPHENHYDRAMINE HCL 25 MG PO CAPS
25.0000 mg | ORAL_CAPSULE | Freq: Once | ORAL | Status: AC
  Administered 2022-09-05: 25 mg via ORAL
  Filled 2022-09-05: qty 1

## 2022-09-05 MED ORDER — SODIUM CHLORIDE 0.9 % IV SOLN: INTRAVENOUS | Status: DC

## 2022-09-05 MED ORDER — PROPOFOL 10 MG/ML IV BOLUS
INTRAVENOUS | Status: DC | PRN
  Administered 2022-09-05: 40 mg via INTRAVENOUS

## 2022-09-05 SURGICAL SUPPLY — 15 items

## 2022-09-05 NOTE — Anesthesia Postprocedure Evaluation (Signed)
Anesthesia Post Note  Patient: [name]  Procedure(s) Performed: ESOPHAGOGASTRODUODENOSCOPY (EGD) WITH PROPOFOL     Patient location during evaluation: Endoscopy Anesthesia Type: MAC Level of consciousness: sedated, patient cooperative and oriented Pain management: pain level controlled Vital Signs Assessment: post-procedure vital signs reviewed and stable Respiratory status: nonlabored ventilation, spontaneous breathing and respiratory function stable Cardiovascular status: blood pressure returned to baseline and stable Postop Assessment: no apparent nausea or vomiting Anesthetic complications: no   No notable events documented.  Last Vitals:  Vitals:   09/05/22 1033 09/05/22 1220  BP: (!) 157/60 (!) 142/60  Pulse: 61 62  Resp: (!) 28 (!) 25  Temp:  36.9 C  SpO2: 92% 94%                  Angella Montas,E. Rheana Casebolt

## 2022-09-05 NOTE — Transfer of Care (Signed)
Immediate Anesthesia Transfer of Care Note  Patient: [name]  Procedure(s) Performed: ESOPHAGOGASTRODUODENOSCOPY (EGD) WITH PROPOFOL  Patient Location: PACU  Anesthesia Type:MAC  Level of Consciousness: drowsy  Airway & Oxygen Therapy: Patient Spontanous Breathing and Patient connected to nasal cannula oxygen  Post-op Assessment: Report given to RN and Post -op Vital signs reviewed and stable  Post vital signs: Reviewed and stable  Last Vitals:  Vitals Value Taken Time  BP    Temp    Pulse 54 09/05/22 0941  Resp 29 09/05/22 0941  SpO2 99 % 09/05/22 0941  Vitals shown include unvalidated device data.  Last Pain:  Vitals:   09/05/22 0833  TempSrc: Temporal  PainSc: 7       Patients Stated Pain Goal: 0 (09/04/22 1750)  Complications: No notable events documented.

## 2022-09-05 NOTE — Telephone Encounter (Signed)
Son asking about resources inpt . I sent message to Care management to f/u with son.

## 2022-09-05 NOTE — Progress Notes (Signed)
Patient ID: MARITSSA SCATURRO, female   DOB: [birth year], 82 y.o.   MRN: [id number]    Progress Note from the Palliative Medicine Team at [facility]   Patient Name: [name]        Date: 09/05/2022 DOB: 03-Jul-1940  Age: 82 y.o. MRN#: [id number] Attending Physician: Maretta Bees, MD Primary Care Physician: [name], MD Admit Date: 08/30/2022   Medical records reviewed   82 y.o. female   admitted on 08/30/2022 with  history of multiple myeloma/diagnosed in May 2024, HTN-who presented with encephalopathy in the setting of narcotic use/AKI.    Patient was managed with supportive care with resolution of encephalopathy-improvement in renal function-however further hospital course complicated by left lower extremity DVT, worsening anemia and melanotic stools.    Significant events: 5/24>> admit to [facility] for encephalopathy/AKI 5/26>> Hb 6.7-1 unit of PRBC ordered 5/27>> melanotic appearing stools 5/28>> GI consult 5/30>> EGD- unit PRBC   Patient and family face ongoing treatment option decisions, advanced directive decisions and anticipatory care needs.   This NP assessed patient at the bedside for palliative medicine needs and emotional support.  Patient's granddaughter Cala Bradford at bedside.  Patient remains weak and lethargic, currently she tells me that pain is being managed with current medication regiment.  Education offered on need to request prn medications      Symptom management:  Bone pain--centered around right hip and lower back           - Tylenol 1000 mg every 8 hours scheduled            -  Oxycodone  5 mg po every 4 hrs prn            -  Dexamethasone 2 mg oral daily in the a.m.  Spoke with son/[name] by telephone.  Ongoing conversation regarding the seriousness of current medical situation, family understand.  Patient and family are open to all offered and available medical interventions  to prolong life.    Hope is for improvement   Once medical stable,  patient is hopeful for ongoing rehabilitation in order to regain strength and ability to continue with recommended cancer treatment.   Expressed concern for overall failure to thrive and high risk for decompensation   Education offered today regarding  the importance of continued conversation with family and the  medical providers regarding overall plan of care and treatment options,  ensuring decisions are within the context of the patients values and GOCs.  Questions and concerns addressed    Time: 50 minutes  Detailed review of medical records ( labs, imaging, vital signs), medically appropriate exam ( MS, skin, resp)   discussed with treatment team, counseling and education to patient, family, staff, documenting clinical information, medication management, coordination of care    Lorinda Creed NP  Palliative Medicine Team Team Phone # [phone] Pager [phone]

## 2022-09-05 NOTE — Brief Op Note (Signed)
09/05/2022  9:29 AM  PATIENT:  [name]  82 y.o. female  PRE-OPERATIVE DIAGNOSIS:  Anemia, melena  POST-OPERATIVE DIAGNOSIS:  severe esophagitis   PROCEDURE:  Procedure(s): ESOPHAGOGASTRODUODENOSCOPY (EGD) WITH PROPOFOL (N/A)  SURGEON:  Surgeon(s) and Role:    * Rahm Minix, MD - Primary  Findings ------------ -EGD showed severe LA grade D erosive esophagitis with contact oozing.  Recommendations ------------------------ -Recommend Protonix 40 mg twice a day for 2 months followed by Protonix 40 mg once a day. -Carafate 1 g twice daily for 4 weeks -Recommend repeat EGD in 3 to 4 months to document healing of severe esophagitis. -Avoid NSAIDs. -If anticoagulation is needed, recommend to start anticoagulation after 48 hours. -No further inpatient GI workup planned.  GI will sign off.  Follow-up in GI clinic in 3 months.  Kathi Der MD, FACP 09/05/2022, 9:30 AM  Contact #  [phone]

## 2022-09-05 NOTE — Progress Notes (Addendum)
PROGRESS NOTE        PATIENT DETAILS Name: [name] Age: 82 y.o. Sex: female Date of Birth: 12-18-1940 Admit Date: 08/30/2022 Admitting Physician Almon Hercules, MD ZOX:[id number], Dorris Singh, MD  Brief Summary: Patient is a 82 y.o.  female history of multiple myeloma, HTN-who presented with encephalopathy in the setting of narcotic use/AKI.  Patient was managed with supportive care with resolution of encephalopathy-improvement in renal function-however further hospital course complicated by left lower extremity DVT, worsening anemia and melanotic stools.  See below for further details.    Significant events: 5/24>> admit to [facility] for encephalopathy/AKI 5/26>> Hb 6.7-1 unit of PRBC ordered 5/27>> melanotic appearing stools 5/28>> GI consult 5/30>> EGD  Significant studies: 5/25>> CXR: Chronic interstitial lung markings. 5/25>> CT head: No acute intracranial abnormality. 5/25>> renal ultrasound: No hydronephrosis 5/25>> x-ray pelvis: No acute osseous abnormality 5/25>> bilateral lower extremity Doppler: Left leg DVT involving posterior tibial/peroneal vein.  Significant microbiology data: None  Procedures: 5/30>> EGD: Severe LA grade D erosive esophagitis with contact oozing  Consults: GI  Subjective: No further black stools since yesterday  Objective: Vitals: Blood pressure (!) 157/60, pulse 61, temperature (!) 97.3 F (36.3 C), temperature source Temporal, resp. rate (!) 28, height 5\' 1"  (1.549 m), weight 65.8 kg, SpO2 92 %.   Exam: Gen Exam:Alert awake-not in any distress HEENT:atraumatic, normocephalic Chest: B/L clear to auscultation anteriorly CVS:S1S2 regular Abdomen:soft non tender, non distended Extremities:no edema Neurology: Non focal Skin: no rash  Pertinent Labs/Radiology:    Latest Ref Rng & Units 09/05/2022    5:29 AM 09/04/2022    5:24 PM 09/04/2022    4:16 AM  CBC  WBC 4.0 - 10.5 K/uL 15.7  15.9  13.2   Hemoglobin 12.0 - 15.0  g/dL 7.5  7.6  7.4   Hematocrit 36.0 - 46.0 % 22.7  23.8  22.3   Platelets 150 - 400 K/uL 228  263  228     Lab Results  Component Value Date   NA 142 09/05/2022   K 4.1 09/05/2022   CL 112 (H) 09/05/2022   CO2 27 09/05/2022      Assessment/Plan: Acute toxic metabolic encephalopathy Secondary to a long-acting morphine use (recently started)/AKI Significant improvement in encephalopathy-and close to baseline.  However some confusion overnight-suspect hospital delirium. Delirium precautions  AKI Likely hemodynamically mediated UA negative for proteinuria-renal ultrasound negative for hydronephrosis Creatinine continues to improve with just supportive care.  Hypokalemia/hypomagnesemia Repleted.  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   Hb stable-but on the lower side-s/p 1 unit of PRBC on 5/26 Given multiple cardiac issues-frailty-will attempt to keep Hb more than 8-will transfuse another unit of PRBC today.    Upper GI bleeding Occurred on 5/27-no melanotic stools for 24 hours EGD with severe erosive esophagitis with contact bleeding-likely in the etiology GI recommending PPI twice daily x 8 weeks followed by once daily, Carafate 1 g twice daily x 4 weeks Second look endoscopy in 3 to 4 months.    Left Leg DVT Initially on IV heparin-transitioned to Eliquis on 5/27 Unfortunately-since patient  developed upper GI bleeding-all anticoagulation on hold Will need to discuss with patient/family regarding whether to attempt to reinitiate anticoagulation in 48 hours-she will likely have some amount of severe anemia due to ongoing multiple  myeloma-plans for chemotherapy that would make anticoagulation difficult for the immediate future-will discuss with vascular surgery to see if she would be a candidate for temporary IVC filter placement.     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 Discussed with Dr. Shirline Frees on 5/28-continue supportive care as we are doing.  He has no further recommendations.  History of bioprosthetic aortic valve replacement 2015 Recent echo 7/23-showed stable prosthetic valve.  HTN BP stable on amlodipine Continue to hold lisinopril/HCTZ given resolving AKI. No beta-blocker due to history of bradycardia.  Debility/deconditioning Due to due to illness superimposed on underlying malignancy. PT/OT eval-SNF recommended  Deep tissue pressure injury to sacrum Present prior to hospitalization Appreciate wound care input Continue wound care per wound care RN.  Pressure Injury 08/31/22 Sacrum Mid Deep Tissue Pressure Injury - Purple or maroon localized area of discolored intact skin or blood-filled blister due to damage of underlying soft tissue from pressure and/or shear. marron, nonblancable (Active)  08/31/22 1300  Location: Sacrum  Location Orientation: Mid  Staging: Deep Tissue Pressure Injury - Purple or maroon localized area of discolored intact skin or blood-filled blister due to damage of underlying soft tissue from pressure and/or shear.  Wound Description (Comments): marron, nonblancable  Present on Admission: Yes    Nutrition Status: Nutrition Problem: Severe Malnutrition Etiology: chronic illness Signs/Symptoms: percent weight loss, energy intake < 75% for > or equal to 1 month Percent weight loss: 13 % Interventions: MVI, Education, Refer to RD note for  recommendations, Nepro shake    BMI: Estimated body mass index is 27.41 kg/m as calculated from the following:   Height as of this encounter: 5\' 1"  (1.549 m).   Weight as of this encounter: 65.8 kg.   Code status:   Code Status: DNR discussed with son/patient-DNR-on 5/29.  DVT Prophylaxis: Eliquis on hold due to upper GI bleeding.   Family Communication: Daughter-in-law at bedside.   Disposition Plan: Status is: Inpatient Remains inpatient appropriate because: Severity of illness   Planned Discharge Destination: SNF   Diet: Diet Order             DIET SOFT Room service appropriate? Yes; Fluid consistency: Thin  Diet effective now                     Antimicrobial agents: Anti-infectives (From admission, onward)    None        MEDICATIONS: Scheduled Meds:  acetaminophen  1,000 mg Oral Q8H   amLODipine  10 mg Oral Daily   dexamethasone  2 mg Oral Daily   feeding supplement (NEPRO CARB STEADY)  237 mL Oral BID BM   lidocaine  1 patch Transdermal Q24H   multivitamin  1 tablet Oral QHS   pantoprazole (PROTONIX) IV  40 mg Intravenous Q12H   sevelamer carbonate  2.4 g Oral TID WC   sodium chloride flush  3 mL Intravenous Q12H   sucralfate  1 g Oral TID WC & HS   Continuous Infusions:   PRN Meds:.hydrALAZINE, naLOXone (NARCAN)  injection, oxyCODONE   I have personally reviewed following labs and imaging studies  LABORATORY DATA:  CBC: Recent Labs  Lab 08/30/22 2348 08/31/22 0516 09/01/22 0112 09/02/22 0341 09/03/22 0136 09/03/22 1731 09/04/22 0416 09/04/22 1724 09/05/22 0529  WBC 13.5* 17.8* 12.5*   < > 12.0* 14.2* 13.2* 15.9* 15.7*  NEUTROABS 11.0* 13.5* 8.9*  --   --   --   --   --   --   HGB 8.2* 7.5* 6.7*   < > 7.7* 8.3* 7.4* 7.6* 7.5*  HCT 25.9* 24.2* 21.2*   < > 23.5* 25.8* 22.3* 23.8* 22.7*  MCV 95.2 94.5 92.2   < > 92.2 92.8 91.8 93.0 91.2  PLT 315 299 249   < > 243 249 228 263 228   < > = values in this interval not displayed.      Basic Metabolic Panel: Recent Labs  Lab 08/31/22 0516 08/31/22 1431 09/01/22 0112 09/02/22 0341 09/03/22 0136 09/03/22 1731 09/04/22 0416 09/05/22 0529  NA 128* 130* 132* 135 134* 138 138 142  K 3.6 3.2* 3.0* 3.1* 2.7* 4.1 3.6 4.1  CL 95* 98 101 105 107 108 107 112*  CO2 22 27 25 25 25 25 27 27   GLUCOSE 106* 115* 110* 126* 119* 131* 114* 114*  BUN 57* 55* 55* 60* 62* 55* 60* 59*  CREATININE 2.37* 2.08* 1.79* 1.45* 1.33* 1.20* 1.18* 1.16*  CALCIUM 9.5 9.5 9.0 9.1 8.8* 8.7* 8.6* 8.5*  MG 1.8  --  1.6* 1.9 1.9  --  2.0 1.8  PHOS >30.0* >30.0* >30.0* >30.0*  --   --   --   --      GFR: Estimated Creatinine Clearance: 31.3 mL/min (A) (by C-G formula based on SCr of 1.16 mg/dL (H)).  Liver Function Tests: Recent Labs  Lab 08/31/22 0031 08/31/22 0516 08/31/22 1431 09/01/22 0112  AST 55* 73*  --  51*  ALT 31 35  --  33  ALKPHOS 123 114  --  105  BILITOT 0.9 1.4*  --  0.8  PROT 11.0* 10.3*  --  9.1*  ALBUMIN <1.5* <1.5* <1.5* <1.5*    No results for input(s): "LIPASE", "AMYLASE" in the last 168 hours. Recent Labs  Lab 08/31/22 1431 09/01/22 0112  AMMONIA 20 28     Coagulation Profile: No results for input(s): "INR", "PROTIME" in the last 168 hours.  Cardiac Enzymes: Recent Labs  Lab 09/01/22 0112  CKTOTAL 119     BNP (last 3 results) No results for input(s): "PROBNP" in the last 8760 hours.  Lipid Profile: No results for input(s): "CHOL", "HDL", "LDLCALC", "TRIG", "CHOLHDL", "LDLDIRECT" in the last 72 hours.  Thyroid Function Tests: No results for input(s): "TSH", "T4TOTAL", "FREET4", "T3FREE", "THYROIDAB" in the last 72 hours.   Anemia Panel: No results for input(s): "VITAMINB12", "FOLATE", "FERRITIN", "TIBC", "IRON", "RETICCTPCT" in the last 72 hours.   Urine analysis:    Component Value Date/Time   COLORURINE YELLOW 08/31/2022 0045   APPEARANCEUR HAZY (A) 08/31/2022 0045   LABSPEC 1.013 08/31/2022 0045   PHURINE 5.0 08/31/2022 0045    GLUCOSEU NEGATIVE 08/31/2022 0045   HGBUR MODERATE (A) 08/31/2022 0045   BILIRUBINUR NEGATIVE 08/31/2022 0045   KETONESUR 5 (A) 08/31/2022 0045   PROTEINUR NEGATIVE 08/31/2022 0045   UROBILINOGEN 0.2 10/12/2013 1117   NITRITE NEGATIVE 08/31/2022 0045   LEUKOCYTESUR NEGATIVE 08/31/2022 0045    Sepsis Labs: Lactic Acid, Venous No results found for: "LATICACIDVEN"  MICROBIOLOGY: No results found for this or any previous visit (from the past 240 hour(s)).  RADIOLOGY STUDIES/RESULTS: No results found.  LOS: 5 days   Jeoffrey Massed, MD  [facility]    To contact the attending provider between 7A-7P or the covering provider during after hours 7P- epigastrium.  Active the patient had multiple  Already on sevelamer 2.4 g failure 7A, please log into the web site [email or web address] and access using universal [place] password for that web site. If you do not have the password, please call the hospital operator.  09/05/2022, 11:33 AM   We have used sleeping calcaneal by volume minus by

## 2022-09-05 NOTE — Anesthesia Preprocedure Evaluation (Addendum)
Anesthesia Evaluation  Patient identified by MRN, date of birth, ID band Patient awake    Reviewed: Allergy & Precautions, NPO status , Patient's Chart, lab work & pertinent test results  History of Anesthesia Complications Negative for: history of anesthetic complications  Airway Mallampati: II  TM Distance: >3 FB Neck ROM: Full    Dental  (+) Partial Upper, Dental Advisory Given   Pulmonary neg pulmonary ROS   breath sounds clear to auscultation       Cardiovascular hypertension, Pt. on medications pulmonary hypertension(-) angina + CAD (non-obstructive) and + Peripheral Vascular Disease  + Valvular Problems/Murmurs (s/p AVR)  Rhythm:Regular Rate:Normal  '23 ECHO: EF 60 to 65%.  1. The LV has normal function, no regional wall motion abnormalities. There is mild LVH. Grade I diastolic dysfunction (impaired relaxation).   2. RVF is normal. The right ventricular size is moderately enlarged. There is mildly elevated pulmonary artery systolic pressure. The estimated right ventricular systolic pressure is 42.4 mmHg.   3. Left atrial size was severely dilated.   4. Right atrial size was mildly dilated.   5. The mitral valve is normal in structure. Mild MR. No evidence of mitral stenosis.   6. Bioprosthetic aortic valve. Mean gradient 17 mmHg with no significant regurgitation   7. Aortic dilatation noted. There is mild dilatation of the ascending  aorta, measuring 40 mm.     Neuro/Psych   Anxiety        GI/Hepatic Neg liver ROS,,,melena   Endo/Other  negative endocrine ROS    Renal/GU Renal InsufficiencyRenal disease     Musculoskeletal   Abdominal   Peds  Hematology  (+) Blood dyscrasia (Hb 7.5, plt 228k), anemia   Anesthesia Other Findings H/o thymectomy  Reproductive/Obstetrics                             Anesthesia Physical Anesthesia Plan  ASA: 3  Anesthesia Plan: MAC   Post-op Pain  Management: Minimal or no pain anticipated   Induction:   PONV Risk Score and Plan: 2 and Ondansetron and Treatment may vary due to age or medical condition  Airway Management Planned: Natural Airway and Nasal Cannula  Additional Equipment: None  Intra-op Plan:   Post-operative Plan:   Informed Consent: I have reviewed the patients History and Physical, chart, labs and discussed the procedure including the risks, benefits and alternatives for the proposed anesthesia with the patient or authorized representative who has indicated his/her understanding and acceptance.   Patient has DNR.  Discussed DNR with patient, Discussed DNR with power of attorney and Suspend DNR.   Dental advisory given  Plan Discussed with: CRNA and Surgeon  Anesthesia Plan Comments: (Discussed with pt, daughter in law, and then son by telephone)        Anesthesia Quick Evaluation

## 2022-09-05 NOTE — TOC Progression Note (Signed)
Transition of Care [facility]) - Progression Note    Patient Details  Name: [name] MRN: [id number] Date of Birth: [birth year]  [facility] CM/SW Contact  Mearl Latin, LCSW Phone Number: 09/05/2022, 3:45 PM  Clinical Narrative:    CSW spoke with Tiphany Mceachern (Patient's daughter in law [phone]). She confirmed patient's son toured facilities. She will have him call CSW with rankings so that CSW can see if they will have a bed on the weekend when patient is ready.    Expected Discharge Plan: Skilled Nursing Facility Barriers to Discharge: Continued Medical Work up, English as a second language teacher  Expected Discharge Plan and Services In-house Referral: Clinical Social Work   Post Acute Care Choice: Skilled Nursing Facility Living arrangements for the past 2 months: Single Family Home                                       Social Determinants of Health (SDOH) Interventions SDOH Screenings   Food Insecurity: No Food Insecurity (08/31/2022)  Housing: Low Risk  (08/31/2022)  Transportation Needs: No Transportation Needs (08/31/2022)  Utilities: Not At Risk (08/31/2022)  Tobacco Use: Low Risk  (09/05/2022)    Readmission Risk Interventions    09/03/2022    9:28 AM  Readmission Risk Prevention Plan  Transportation Screening Complete  Medication Review (RN Care Manager) Complete  PCP or Specialist appointment within 3-5 days of discharge Complete  HRI or Home Care Consult Complete  SW Recovery Care/Counseling Consult Complete  Palliative Care Screening Not Applicable  Skilled Nursing Facility Complete

## 2022-09-05 NOTE — Progress Notes (Signed)
SLP Cancellation Note  Patient Details Name: [name] MRN: [id number] DOB: 1940/04/21   Cancelled treatment:       Reason Eval/Treat Not Completed: Patient at procedure or test/unavailable;pt OTF for procedure; will continue efforts.   Pat Valyncia Wiens,M.S., CCC-SLP 09/05/2022, 9:46 AM

## 2022-09-05 NOTE — Interval H&P Note (Signed)
History and Physical Interval Note:  09/05/2022 8:54 AM  [name]  has presented today for surgery, with the diagnosis of Anemia, melena.  The various methods of treatment have been discussed with the patient and family. After consideration of risks, benefits and other options for treatment, the patient has consented to  Procedure(s): ESOPHAGOGASTRODUODENOSCOPY (EGD) WITH PROPOFOL (N/A) as a surgical intervention.  The patient's history has been reviewed, patient examined, no change in status, stable for surgery.  I have reviewed the patient's chart and labs.  Questions were answered to the patient's satisfaction.     Arturo Sofranko

## 2022-09-05 NOTE — Progress Notes (Signed)
Physical Therapy Treatment Patient Details Name: [name] MRN: [id number] DOB: Nov 04, 1940 Today's Date: 09/05/2022   History of Present Illness 82 year old F presenting to ED with progressive altered mental status, lethargy and poor p.o. intake and admitted for acute toxic metabolic encephalopathy likely due to pain medication and Compazine as well as AKI with azotemia and hypercalcemia.with PMH of multiple myeloma on chemo, AVR, and HTN    PT Comments    Pt tolerated today's session fairly well, but remains limited by pain and fatigue. Pt agreeable to mobility, ambulating to/from the bathroom with minA for balance and RW management. After a seated rest break on the bed, pt declining attempts at further ambulation or sitting in the chair due to pain in her sacrum. Pt required mod-maxA for bed mobility due to pain, requiring increased time to perform all mobility. Acute PT will continue to follow pt to progress and encourage mobility, discharge recommendations updated to low intensity PT in a facility setting.    Recommendations for follow up therapy are one component of a multi-disciplinary discharge planning process, led by the attending physician.  Recommendations may be updated based on patient status, additional functional criteria and insurance authorization.  Follow Up Recommendations  Can patient physically be transported by private vehicle: No    Assistance Recommended at Discharge Frequent or constant Supervision/Assistance  Patient can return home with the following A lot of help with walking and/or transfers;A lot of help with bathing/dressing/bathroom;Assistance with cooking/housework;Direct supervision/assist for medications management;Assist for transportation;Help with stairs or ramp for entrance   Equipment Recommendations  Other (comment) (defer to next level)    Recommendations for Other Services       Precautions / Restrictions Precautions Precautions:  Fall Precaution Comments: sacral wound Restrictions Weight Bearing Restrictions: No     Mobility  Bed Mobility Overal bed mobility: Needs Assistance Bed Mobility: Supine to Sit, Sit to Supine     Supine to sit: Mod assist, HOB elevated Sit to supine: Max assist, HOB elevated   General bed mobility comments: modA for supine>sit for trunk support and mild BLE management, maxA to return to supine for both trunk and BLE management with minimal assist from pt due to pain    Transfers Overall transfer level: Needs assistance Equipment used: Rolling walker (2 wheels) Transfers: Sit to/from Stand Sit to Stand: Min assist           General transfer comment: cueing for hand placement and use of grab bar in the bathroom, minA to power up and steady    Ambulation/Gait Ambulation/Gait assistance: Min assist Gait Distance (Feet): 15 Feet (x2 trials) Assistive device: Rolling walker (2 wheels) Gait Pattern/deviations: Step-through pattern, Decreased stride length, Knee flexed in stance - left, Knee flexed in stance - right, Trunk flexed, Drifts right/left Gait velocity: decreased     General Gait Details: minA for balance and cueing for RW management. Cued for upright posture and forward gaze, requires consistent motivation   Stairs             Wheelchair Mobility    Modified Rankin (Stroke Patients Only)       Balance Overall balance assessment: Needs assistance Sitting-balance support: Feet supported Sitting balance-Leahy Scale: Fair     Standing balance support: Bilateral upper extremity supported, During functional activity, Reliant on assistive device for balance Standing balance-Leahy Scale: Poor Standing balance comment: reliant on RW  Cognition Arousal/Alertness: Awake/alert Behavior During Therapy: Flat affect Overall Cognitive Status: Impaired/Different from baseline Area of Impairment: Attention, Following  commands, Memory, Safety/judgement, Awareness, Problem solving                   Current Attention Level: Sustained Memory: Decreased short-term memory Following Commands: Follows one step commands with increased time Safety/Judgement: Decreased awareness of deficits, Decreased awareness of safety Awareness: Intellectual Problem Solving: Slow processing, Decreased initiation, Difficulty sequencing, Requires verbal cues, Requires tactile cues General Comments: encouragement provided throughout session, increased time required for processing and command following        Exercises      General Comments General comments (skin integrity, edema, etc.): SPO2 919-96% throughout session on room air. Pt's granddaughter present      Pertinent Vitals/Pain Pain Assessment Pain Assessment: Faces Faces Pain Scale: Hurts even more Pain Location: sacrum Pain Descriptors / Indicators: Grimacing, Guarding, Discomfort, Sore Pain Intervention(s): Monitored during session, Limited activity within patient's tolerance, Repositioned    Home Living                          Prior Function            PT Goals (current goals can now be found in the care plan section) Acute Rehab PT Goals Patient Stated Goal: get stronger and return home PT Goal Formulation: With patient/family Time For Goal Achievement: 09/15/22 Potential to Achieve Goals: Good Progress towards PT goals: Progressing toward goals    Frequency    Min 3X/week      PT Plan Discharge plan needs to be updated    Co-evaluation              AM-PAC PT "6 Clicks" Mobility   Outcome Measure  Help needed turning from your back to your side while in a flat bed without using bedrails?: A Lot Help needed moving from lying on your back to sitting on the side of a flat bed without using bedrails?: A Lot Help needed moving to and from a bed to a chair (including a wheelchair)?: A Little Help needed standing up  from a chair using your arms (e.g., wheelchair or bedside chair)?: A Little Help needed to walk in hospital room?: A Little Help needed climbing 3-5 steps with a railing? : Total 6 Click Score: 14    End of Session Equipment Utilized During Treatment: Gait belt Activity Tolerance: Patient limited by pain;Patient limited by fatigue Patient left: with call bell/phone within reach;with family/visitor present;in bed;with bed alarm set Nurse Communication: Mobility status PT Visit Diagnosis: Unsteadiness on feet (R26.81);Muscle weakness (generalized) (M62.81);Difficulty in walking, not elsewhere classified (R26.2);Pain Pain - part of body:  (buttock)     Time: 1610-9604 PT Time Calculation (min) (ACUTE ONLY): 24 min  Charges:  $Gait Training: 8-22 mins $Therapeutic Activity: 8-22 mins                     Lindalou Hose, PT DPT Acute Rehabilitation Services Office [phone]    Leonie Man 09/05/2022, 3:47 PM

## 2022-09-05 NOTE — Consult Note (Signed)
[facility] [name]  ASSESSMENT / PLAN: 82 y.o. female with left tibial vein deep venous thrombosis, severe anemia, metastatic melanoma. Not a great candidate for anticoagulation. IVC filter requested. I think this is reasonable. Will plan to do this tomorrow in cath lab. NPO after midnight. Orders for consent written.  CHIEF COMPLAINT: DVT  HISTORY OF PRESENT ILLNESS: [name] is a 82 y.o. female admitted to the internal medicine service for treatment of colopathy and acute kidney injury.  She was found to have melena.  She underwent endoscopy.  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  She was found to have a left calf vein DVT 5 days ago.  Because of her malignancy, esophagitis, and anemia, she was felt to be a poor candidate for anticoagulation in the short-term.  An IVC filter was requested.  I reviewed these findings with the patient and her son.  They are amenable to proceeding with filter placement tomorrow.  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 by echo 10/2021   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/2015   Carotid artery stenosis    < 50% stenosis bilaterally by doppler 07/2016   Coronary artery disease 2008   Coronary Ca score of 331 with minimal multivessel plaque < 25% stenosis by coronary CTA 8/23   Heart murmur    per pt   Hypercholesteremia    Hypertension    Obesity    Osteopenia    declines treatment   Pneumonia 1995   Pulmonary HTN (HCC)    mild to moderate by echo 7/23 with PASP   Shoulder pain    Due to arthritis   Vitamin D insufficiency     Past Surgical History:  Procedure Laterality Date   AORTIC VALVE REPLACEMENT N/A 10/14/2013   Procedure: AORTIC VALVE REPLACEMENT  (AVR);  Surgeon: Alleen Borne, MD;  Location: [facility] OR;  Service: Open Heart Surgery;  Laterality: N/A;   CARDIAC CATHETERIZATION     CATARACT EXTRACTION, BILATERAL     CHOLECYSTECTOMY  04/08/1988   INTRAOPERATIVE TRANSESOPHAGEAL ECHOCARDIOGRAM N/A 10/14/2013   Procedure: INTRAOPERATIVE TRANSESOPHAGEAL ECHOCARDIOGRAM;  Surgeon: Alleen Borne, MD;  Location: [facility] OR;  Service: Open Heart Surgery;  Laterality: N/A;   KYPHOPLASTY Bilateral 03/27/2022   Procedure: KYPHOPLASTY AND BIOPSY THORACIC ELEVEN;  Surgeon: Lisbeth Renshaw, MD;  Location: [facility] OR;  Service: Neurosurgery;  Laterality: Bilateral;   LEFT AND RIGHT HEART CATHETERIZATION WITH CORONARY ANGIOGRAM N/A 09/16/2013   Procedure: LEFT AND RIGHT HEART CATHETERIZATION WITH CORONARY ANGIOGRAM;  Surgeon: Quintella Reichert, MD;  Location: [facility] CATH LAB;  Service: Cardiovascular;  Laterality: N/A;   TONSILLECTOMY      Family History  Problem Relation Age of Onset   Dementia Mother    Heart disease Father    Heart attack Father    Heart disease Brother    Heart attack Brother    Arrhythmia Sister    Heart attack Maternal Grandfather     Social History   Socioeconomic History   Marital status: Widowed    Spouse name: Not on file   Number of children: Not on file   Years of education: Not on file  Highest education level: Not on file  Occupational History   Not on file  Tobacco Use   Smoking status: Never   Smokeless tobacco: Never  Vaping Use   Vaping Use: Never used  Substance and Sexual Activity   Alcohol use: No   Drug use: No   Sexual activity: Not Currently  Other Topics Concern   Not on file  Social History Narrative   Not on file   Social Determinants of Health   Financial Resource Strain: Not on file  Food Insecurity: No Food Insecurity (08/31/2022)   Hunger Vital Sign    Worried About Running Out of Food in the Last Year: Never true    Ran Out of Food in the Last Year: Never true  Transportation Needs: No  Transportation Needs (08/31/2022)   PRAPARE - Administrator, Civil Service (Medical): No    Lack of Transportation (Non-Medical): No  Physical Activity: Not on file  Stress: Not on file  Social Connections: Not on file  Intimate Partner Violence: Not At Risk (08/31/2022)   Humiliation, Afraid, Rape, and Kick questionnaire    Fear of Current or Ex-Partner: No    Emotionally Abused: No    Physically Abused: No    Sexually Abused: No    Allergies  Allergen Reactions   Crestor [Rosuvastatin Calcium] Other (See Comments)    Myalgias with 5mg  daily dose   Lactose Other (See Comments)   Lipitor [Atorvastatin] Other (See Comments)    Stomach pain   Pravastatin Other (See Comments)    Myalgias    Simvastatin Other (See Comments)    Myalgias    Tramadol Other (See Comments)    FEELS LOOPY   Zetia [Ezetimibe]     Myalgias   Codeine Palpitations   Penicillins Palpitations   Sulfa Antibiotics Palpitations   Vancomycin Itching and Other (See Comments)    Pt developed flushing and itchy scalp greater than into the infusion.  RedMan syndrone developed.  Will need slower admin rate for future infusions.    Current Facility-Administered Medications  Medication Dose Route Frequency Provider Last Rate Last Admin   acetaminophen (TYLENOL) tablet 1,000 mg  1,000 mg Oral Q8H Ghimire, Shanker M, MD   1,000 mg at 09/04/22 2122   amLODipine (NORVASC) tablet 10 mg  10 mg Oral Daily Pham, Minh Q, RPH-CPP   10 mg at 09/05/22 1048   dexamethasone (DECADRON) tablet 2 mg  2 mg Oral Daily Canary Brim, NP   2 mg at 09/05/22 1048   feeding supplement (NEPRO CARB STEADY) liquid 237 mL  237 mL Oral BID BM Ghimire, Werner Lean, MD   237 mL at 09/05/22 1430   hydrALAZINE (APRESOLINE) injection 5 mg  5 mg Intravenous Q4H PRN Gertha Calkin, MD       lidocaine (LIDODERM) 5 % 1 patch  1 patch Transdermal Q24H Maretta Bees, MD   1 patch at 09/05/22 1457   multivitamin (RENA-VIT) tablet 1  tablet  1 tablet Oral QHS Maretta Bees, MD   1 tablet at 09/04/22 2122   naloxone [facility]) injection 0.4 mg  0.4 mg Intravenous PRN Candelaria Stagers T, MD       oxyCODONE (Oxy IR/ROXICODONE) immediate release tablet 5 mg  5 mg Oral Q4H PRN Canary Brim, NP   5 mg at 09/05/22 1049   pantoprazole (PROTONIX) injection 40 mg  40 mg Intravenous Q12H Brahmbhatt, Parag, MD   40 mg at 09/05/22 1033  sevelamer carbonate (RENVELA) powder PACK 2.4 g  2.4 g Oral TID WC Gonfa, Taye T, MD   2.4 g at 09/04/22 1717   sodium chloride flush (NS) 0.9 % injection 3 mL  3 mL Intravenous Q12H Irena Cords V, MD   3 mL at 09/04/22 2123   sucralfate (CARAFATE) 1 GM/10ML suspension 1 g  1 g Oral TID WC & HS Pierce, Dwayne A, RPH   1 g at 09/05/22 1208    PHYSICAL EXAM Vitals:   09/05/22 1307 09/05/22 1441 09/05/22 1507 09/05/22 1630  BP: (!) 135/53 (!) 150/57 (!) 135/53 (!) 132/44  Pulse: 65 74 70 (!) 59  Resp: (!) 27 19 20  (!) 22  Temp: 98.3 F (36.8 C) 98.4 F (36.9 C) 98.3 F (36.8 C) 98 F (36.7 C)  TempSrc: Oral Oral Oral Oral  SpO2: 91% 95% 93% 93%  Weight:      Height:       Chronically ill-appearing elderly woman in no acute distress.  Sitting at the edge of the bed. Regular rate and rhythm Unlabored breathing No edema in the lower extremities bilaterally   PERTINENT LABORATORY AND RADIOLOGIC DATA  Most recent CBC    Latest Ref Rng & Units 09/05/2022    5:29 AM 09/04/2022    5:24 PM 09/04/2022    4:16 AM  CBC  WBC 4.0 - 10.5 K/uL 15.7  15.9  13.2   Hemoglobin 12.0 - 15.0 g/dL 7.5  7.6  7.4   Hematocrit 36.0 - 46.0 % 22.7  23.8  22.3   Platelets 150 - 400 K/uL 228  263  228      Most recent CMP    Latest Ref Rng & Units 09/05/2022    5:29 AM 09/04/2022    4:16 AM 09/03/2022    5:31 PM  CMP  Glucose 70 - 99 mg/dL 409  811  914   BUN 8 - 23 mg/dL 59  60  55   Creatinine 0.44 - 1.00 mg/dL 7.82  9.56  2.13   Sodium 135 - 145 mmol/L 142  138  138   Potassium 3.5 - 5.1 mmol/L 4.1  3.6   4.1   Chloride 98 - 111 mmol/L 112  107  108   CO2 22 - 32 mmol/L 27  27  25    Calcium 8.9 - 10.3 mg/dL 8.5  8.6  8.7     Renal function Estimated Creatinine Clearance: 31.3 mL/min (A) (by C-G formula based on SCr of 1.16 mg/dL (H)).  Hgb A1c MFr Bld (%)  Date Value  10/12/2013 5.9 (H)    LDL Chol Calc (NIH)  Date Value Ref Range Status  01/07/2022 158 (H) 0 - 99 mg/dL Final    Rande Brunt. Lenell Antu, MD FACS [facility] Phone Number: [phone] 09/05/2022 4:47 PM   Total time spent on preparing this encounter including chart review, data review, collecting history, examining the patient, coordinating care for this new patient, 60 minutes.  Portions of this report may have been transcribed using voice recognition software.  Every effort has been made to ensure accuracy; however, inadvertent computerized transcription errors may still be present.

## 2022-09-05 NOTE — Op Note (Signed)
[facility] Patient Name: [name] Procedure Date : 09/05/2022 MRN: [id number] Attending MD: Kathi Der , MD, [id number] Date of Birth: August 02, 1940 CSN: [id number] Age: 82 Admit Type: Inpatient Procedure:                Upper GI endoscopy Indications:              Melena Providers:                Kathi Der, MD, Lorenza Evangelist, RN, Harrington Challenger, Technician Referring MD:              Medicines:                Sedation Administered by an Anesthesia Professional Complications:            No immediate complications. Estimated Blood Loss:     Estimated blood loss was minimal. Procedure:                Pre-Anesthesia Assessment:                           - Prior to the procedure, a History and Physical                            was performed, and patient medications and                            allergies were reviewed. The patient's tolerance of                            previous anesthesia was also reviewed. The risks                            and benefits of the procedure and the sedation                            options and risks were discussed with the patient.                            All questions were answered, and informed consent                            was obtained. Prior Anticoagulants: The patient has                            taken heparin, last dose was 3 days prior to                            procedure. ASA Grade Assessment: III - A patient                            with severe systemic disease. After reviewing the  risks and benefits, the patient was deemed in                            satisfactory condition to undergo the procedure.                           After obtaining informed consent, the endoscope was                            passed under direct vision. Throughout the                            procedure, the patient's blood pressure, pulse, and                             oxygen saturations were monitored continuously. The                            [id number]-[id number] ([id number]) Olympus endoscope was introduced                            through the mouth, and advanced to the second part                            of duodenum. The upper GI endoscopy was                            accomplished without difficulty. The patient                            tolerated the procedure well. Scope In: Scope Out: Findings:      LA Grade D (one or more mucosal breaks involving at least 75% of       esophageal circumference) esophagitis with no bleeding was found in the       entire esophagus.      Scattered mild inflammation characterized by erythema was found in the       entire examined stomach.      The cardia and gastric fundus were normal on retroflexion.      Patchy mildly erythematous mucosa was found in the entire duodenum. Impression:               - LA Grade D erosive esophagitis with no bleeding.                           - Gastritis.                           - Erythematous duodenopathy.                           - No specimens collected. Recommendation:           - Return patient to hospital ward for ongoing care.                           - Resume  previous diet.                           - Continue present medications.                           - Repeat upper endoscopy in 3 months to check                            healing.                           - Return to my office in 3 months. Procedure Code(s):        --- Professional ---                           [phone], Esophagogastroduodenoscopy, flexible,                            transoral; diagnostic, including collection of                            specimen(s) by brushing or washing, when performed                            (separate procedure) Diagnosis Code(s):        --- Professional ---                           K20.80, Other esophagitis without bleeding                           K29.70, Gastritis,  unspecified, without bleeding                           K31.89, Other diseases of stomach and duodenum                           K92.1, Melena (includes Hematochezia) CPT copyright 2022 American Medical Association. All rights reserved. The codes documented in this report are preliminary and upon coder review may  be revised to meet current compliance requirements. Kathi Der, MD Kathi Der, MD 09/05/2022 9:36:17 AM Number of Addenda: 0

## 2022-09-06 ENCOUNTER — Telehealth: Payer: Self-pay | Admitting: Medical Oncology

## 2022-09-06 ENCOUNTER — Inpatient Hospital Stay: Payer: Medicare PPO

## 2022-09-06 ENCOUNTER — Encounter (HOSPITAL_COMMUNITY)
Admission: EM | Disposition: A | Discharge status: Skilled Nursing Facility | Payer: Self-pay | Source: Home / Self Care | Attending: Internal Medicine

## 2022-09-06 DIAGNOSIS — E43 Unspecified severe protein-calorie malnutrition: Secondary | ICD-10-CM

## 2022-09-06 DIAGNOSIS — R4182 Altered mental status, unspecified: Secondary | ICD-10-CM | POA: Diagnosis not present

## 2022-09-06 DIAGNOSIS — Z7189 Other specified counseling: Secondary | ICD-10-CM

## 2022-09-06 DIAGNOSIS — N179 Acute kidney failure, unspecified: Secondary | ICD-10-CM | POA: Diagnosis not present

## 2022-09-06 DIAGNOSIS — I82442 Acute embolism and thrombosis of left tibial vein: Secondary | ICD-10-CM | POA: Diagnosis not present

## 2022-09-06 DIAGNOSIS — C9000 Multiple myeloma not having achieved remission: Secondary | ICD-10-CM

## 2022-09-06 DIAGNOSIS — D649 Anemia, unspecified: Secondary | ICD-10-CM | POA: Diagnosis not present

## 2022-09-06 DIAGNOSIS — G9340 Encephalopathy, unspecified: Secondary | ICD-10-CM

## 2022-09-06 HISTORY — PX: IVC FILTER INSERTION: CATH118245

## 2022-09-06 LAB — TYPE AND SCREEN
ABO/RH(D): O POS
Antibody Screen: NEGATIVE

## 2022-09-06 LAB — CBC
HCT: 25.9 % — ABNORMAL LOW (ref 36.0–46.0)
Hemoglobin: 8.7 g/dL — ABNORMAL LOW (ref 12.0–15.0)
MCH: 31.1 pg (ref 26.0–34.0)
MCHC: 33.6 g/dL (ref 30.0–36.0)
MCV: 92.5 fL (ref 80.0–100.0)
Platelets: 185 10*3/uL (ref 150–400)
RBC: 2.8 MIL/uL — ABNORMAL LOW (ref 3.87–5.11)
RDW: 16.8 % — ABNORMAL HIGH (ref 11.5–15.5)
WBC: 11.7 10*3/uL — ABNORMAL HIGH (ref 4.0–10.5)
nRBC: 0.2 % (ref 0.0–0.2)

## 2022-09-06 LAB — BPAM RBC
ISSUE DATE / TIME: 202405301449
Unit Type and Rh: 5100

## 2022-09-06 SURGERY — IVC FILTER INSERTION
Anesthesia: LOCAL

## 2022-09-06 MED ORDER — LIDOCAINE HCL (PF) 1 % IJ SOLN
INTRAMUSCULAR | Status: AC
  Filled 2022-09-06: qty 30

## 2022-09-06 MED ORDER — SODIUM CHLORIDE 0.9 % IV SOLN: 250.0000 mL | INTRAVENOUS | Status: DC | PRN

## 2022-09-06 MED ORDER — LABETALOL HCL 5 MG/ML IV SOLN: 10.0000 mg | INTRAVENOUS | Status: DC | PRN

## 2022-09-06 MED ORDER — HYDRALAZINE HCL 20 MG/ML IJ SOLN: 5.0000 mg | INTRAMUSCULAR | Status: DC | PRN

## 2022-09-06 MED ORDER — SODIUM CHLORIDE 0.9 % IV SOLN: INTRAVENOUS | Status: DC

## 2022-09-06 MED ORDER — MIDAZOLAM HCL 2 MG/2ML IJ SOLN
INTRAMUSCULAR | Status: DC | PRN
  Administered 2022-09-06: .5 mg via INTRAVENOUS

## 2022-09-06 MED ORDER — IODIXANOL 320 MG/ML IV SOLN
INTRAVENOUS | Status: DC | PRN
  Administered 2022-09-06: 25 mL

## 2022-09-06 MED ORDER — SODIUM CHLORIDE 0.9 % WEIGHT BASED INFUSION
1.0000 mL/kg/h | INTRAVENOUS | Status: AC
  Administered 2022-09-06: 1 mL/kg/h via INTRAVENOUS

## 2022-09-06 MED ORDER — ONDANSETRON HCL 4 MG/2ML IJ SOLN
4.0000 mg | Freq: Four times a day (QID) | INTRAMUSCULAR | Status: DC | PRN
  Administered 2022-09-07 – 2022-09-08 (×3): 4 mg via INTRAVENOUS
  Filled 2022-09-06 (×3): qty 2

## 2022-09-06 MED ORDER — MEDIHONEY WOUND/BURN DRESSING EX PSTE
1.0000 | PASTE | Freq: Every day | CUTANEOUS | Status: DC
  Administered 2022-09-06 – 2022-09-08 (×3): 1 via TOPICAL
  Filled 2022-09-06: qty 44

## 2022-09-06 MED ORDER — LIDOCAINE HCL (PF) 1 % IJ SOLN
INTRAMUSCULAR | Status: DC | PRN
  Administered 2022-09-06: 10 mL

## 2022-09-06 MED ORDER — HEPARIN (PORCINE) IN NACL 1000-0.9 UT/500ML-% IV SOLN
INTRAVENOUS | Status: DC | PRN
  Administered 2022-09-06: 500 mL

## 2022-09-06 MED ORDER — MIDAZOLAM HCL 2 MG/2ML IJ SOLN
INTRAMUSCULAR | Status: AC
  Filled 2022-09-06: qty 2

## 2022-09-06 MED ORDER — ACETAMINOPHEN 325 MG PO TABS
650.0000 mg | ORAL_TABLET | ORAL | Status: DC | PRN
  Administered 2022-09-06: 650 mg via ORAL
  Filled 2022-09-06: qty 2

## 2022-09-06 MED ORDER — SODIUM CHLORIDE 0.9% FLUSH
3.0000 mL | Freq: Two times a day (BID) | INTRAVENOUS | Status: DC
  Administered 2022-09-07 – 2022-09-08 (×3): 3 mL via INTRAVENOUS

## 2022-09-06 MED ORDER — SODIUM CHLORIDE 0.9% FLUSH: 3.0000 mL | INTRAVENOUS | Status: DC | PRN

## 2022-09-06 MED ORDER — FENTANYL CITRATE (PF) 100 MCG/2ML IJ SOLN
INTRAMUSCULAR | Status: AC
  Filled 2022-09-06: qty 2

## 2022-09-06 MED ORDER — FENTANYL CITRATE (PF) 100 MCG/2ML IJ SOLN
INTRAMUSCULAR | Status: DC | PRN
  Administered 2022-09-06: 25 ug via INTRAVENOUS

## 2022-09-06 SURGICAL SUPPLY — 14 items
BAG SNAP BAND KOVER 36X36 (MISCELLANEOUS) IMPLANT
FILTER VC CELECT-FEMORAL (Filter) IMPLANT
KIT MICROPUNCTURE NIT STIFF (SHEATH) IMPLANT
KIT PV (KITS) ×1 IMPLANT
PROTECTION STATION PRESSURIZED (MISCELLANEOUS) ×1
SHEATH PROBE COVER 6X72 (BAG) IMPLANT
STATION PROTECTION PRESSURIZED (MISCELLANEOUS) IMPLANT
STOPCOCK MORSE 400PSI 3WAY (MISCELLANEOUS) IMPLANT
SYR MEDRAD MARK 7 150ML (SYRINGE) ×1 IMPLANT
TRANSDUCER W/STOPCOCK (MISCELLANEOUS) ×1 IMPLANT
TRAY PV CATH (CUSTOM PROCEDURE TRAY) ×1 IMPLANT
TUBING CIL FLEX 10 FLL-RA (TUBING) IMPLANT
TUBING CONTRAST HIGH PRESS 48 (TUBING) IMPLANT
WIRE BENTSON .035X145CM (WIRE) IMPLANT

## 2022-09-06 NOTE — Op Note (Signed)
DATE OF SERVICE: 09/06/2022  PATIENT:  [name]  82 y.o. female  PRE-OPERATIVE DIAGNOSIS:  DVT, relative contraindication to anticoagulation  POST-OPERATIVE DIAGNOSIS:  Same  PROCEDURE:   Ultrasound guided right common femoral vein placement IVC filter placement Conscious sedation (10 minutes)  SURGEON:  Surgeon(s) and Role:    * Leonie Douglas, MD - Primary  ASSISTANT: none  ANESTHESIA:   local and IV sedation  EBL: minimal  BLOOD ADMINISTERED:none  DRAINS: none   LOCAL MEDICATIONS USED:  LIDOCAINE   SPECIMEN:  none  COUNTS: confirmed correct.  TOURNIQUET:  none  PATIENT DISPOSITION:  PACU - hemodynamically stable.   Delay start of Pharmacological VTE agent (>24hrs) due to surgical blood loss or risk of bleeding: no  INDICATION FOR PROCEDURE: NAUTIKA LYKES is a 82 y.o. female with DVT, relative contraindication to anticoagulation. After careful discussion of risks, benefits, and alternatives the patient was offered IVC filter placement. The patient understood and wished to proceed.  OPERATIVE FINDINGS: successful IVC filter placement  DESCRIPTION OF PROCEDURE: After identification of the patient in the pre-operative holding area, the patient was transferred to the operating room. The patient was positioned supine on the operating room table. Anesthesia was induced. The groins were prepped and draped in standard fashion. A surgical pause was performed confirming correct patient, procedure, and operative location.  Ultrasound guidance was used to obtain micropuncture access in the right common femoral vein.  Through the micro sheath, Bentson wire was navigated into the IVC.  The tract was dilated.  A Cook select IVC filter system was prepared per [employer] instructions and brought onto the table.  The sheath and flush catheter were then advanced into the infrarenal IVC.  (Venogram was performed.  We marked the renal veins.  An IVC filter was deployed under the  renal veins.  This was uncomplicated.  A follow-up venogram was performed showing satisfactory IVC filter placement.  All endovascular equipment was removed.  Manual pressure was held on the vena puncture access site with good result.  Upon completion of the case instrument and sharps counts were confirmed correct. The patient was transferred to the PACU in good condition. I was present for all portions of the procedure.  FOLLOW UP PLAN: I will see the patient again in 1 to 2 months to schedule IVC filter retrieval.  Please call for questions in the interim.  Rande Brunt. Lenell Antu, MD [facility] Phone Number: [phone] 09/06/2022 2:27 PM

## 2022-09-06 NOTE — Plan of Care (Signed)
  Problem: Education: Goal: Knowledge of General Education information will improve Description: Including pain rating scale, medication(s)/side effects and non-pharmacologic comfort measures 09/06/2022 1514 by Rosalio Macadamia, RN Outcome: Progressing 09/06/2022 1514 by Rosalio Macadamia, RN Outcome: Progressing   Problem: Health Behavior/Discharge Planning: Goal: Ability to manage health-related needs will improve 09/06/2022 1514 by Rosalio Macadamia, RN Outcome: Progressing 09/06/2022 1514 by Rosalio Macadamia, RN Outcome: Progressing   Problem: Clinical Measurements: Goal: Ability to maintain clinical measurements within normal limits will improve 09/06/2022 1514 by Rosalio Macadamia, RN Outcome: Progressing 09/06/2022 1514 by Rosalio Macadamia, RN Outcome: Progressing Goal: Will remain free from infection 09/06/2022 1514 by Rosalio Macadamia, RN Outcome: Progressing 09/06/2022 1514 by Rosalio Macadamia, RN Outcome: Progressing Goal: Diagnostic test results will improve 09/06/2022 1514 by Rosalio Macadamia, RN Outcome: Progressing 09/06/2022 1514 by Rosalio Macadamia, RN Outcome: Progressing Goal: Respiratory complications will improve 09/06/2022 1514 by Rosalio Macadamia, RN Outcome: Progressing 09/06/2022 1514 by Rosalio Macadamia, RN Outcome: Progressing Goal: Cardiovascular complication will be avoided 09/06/2022 1514 by Rosalio Macadamia, RN Outcome: Progressing 09/06/2022 1514 by Rosalio Macadamia, RN Outcome: Progressing   Problem: Activity: Goal: Risk for activity intolerance will decrease 09/06/2022 1514 by Rosalio Macadamia, RN Outcome: Progressing 09/06/2022 1514 by Rosalio Macadamia, RN Outcome: Progressing   Problem: Nutrition: Goal: Adequate nutrition will be maintained 09/06/2022 1514 by Rosalio Macadamia, RN Outcome: Progressing 09/06/2022 1514 by Rosalio Macadamia, RN Outcome: Progressing   Problem: Coping: Goal: Level of anxiety will decrease 09/06/2022 1514 by Rosalio Macadamia, RN Outcome:  Progressing 09/06/2022 1514 by Rosalio Macadamia, RN Outcome: Progressing   Problem: Elimination: Goal: Will not experience complications related to bowel motility 09/06/2022 1514 by Rosalio Macadamia, RN Outcome: Progressing 09/06/2022 1514 by Rosalio Macadamia, RN Outcome: Progressing Goal: Will not experience complications related to urinary retention 09/06/2022 1514 by Rosalio Macadamia, RN Outcome: Progressing 09/06/2022 1514 by Rosalio Macadamia, RN Outcome: Progressing   Problem: Pain Managment: Goal: General experience of comfort will improve 09/06/2022 1514 by Rosalio Macadamia, RN Outcome: Progressing 09/06/2022 1514 by Rosalio Macadamia, RN Outcome: Progressing   Problem: Safety: Goal: Ability to remain free from injury will improve 09/06/2022 1514 by Rosalio Macadamia, RN Outcome: Progressing 09/06/2022 1514 by Rosalio Macadamia, RN Outcome: Progressing   Problem: Skin Integrity: Goal: Risk for impaired skin integrity will decrease 09/06/2022 1514 by Rosalio Macadamia, RN Outcome: Progressing 09/06/2022 1514 by Rosalio Macadamia, RN Outcome: Progressing   Problem: Education: Goal: Understanding of CV disease, CV risk reduction, and recovery process will improve 09/06/2022 1514 by Rosalio Macadamia, RN Outcome: Progressing 09/06/2022 1514 by Rosalio Macadamia, RN Outcome: Progressing Goal: Individualized Educational Video(s) 09/06/2022 1514 by Rosalio Macadamia, RN Outcome: Progressing 09/06/2022 1514 by Rosalio Macadamia, RN Outcome: Progressing   Problem: Activity: Goal: Ability to return to baseline activity level will improve 09/06/2022 1514 by Rosalio Macadamia, RN Outcome: Progressing 09/06/2022 1514 by Rosalio Macadamia, RN Outcome: Progressing   Problem: Cardiovascular: Goal: Ability to achieve and maintain adequate cardiovascular perfusion will improve Outcome: Progressing Goal: Vascular access site(s) Level 0-1 will be maintained Outcome: Progressing   Problem: Health  Behavior/Discharge Planning: Goal: Ability to safely manage health-related needs after discharge will improve Outcome: Progressing

## 2022-09-06 NOTE — Progress Notes (Addendum)
PROGRESS NOTE        PATIENT DETAILS Name: [name] Age: 82 y.o. Sex: female Date of Birth: 17-Sep-1940 Admit Date: 08/30/2022 Admitting Physician Almon Hercules, MD RUE:AVWU, Dorris Singh, MD  Brief Summary: Patient is a 82 y.o.  female history of multiple myeloma, HTN-who presented with encephalopathy in the setting of narcotic use/AKI.  Patient was managed with supportive care with resolution of encephalopathy-improvement in renal function-however further hospital course complicated by left lower extremity DVT, worsening anemia and melanotic stools.  See below for further details.    Significant events: 5/24>> admit to [facility] for encephalopathy/AKI 5/25>> left leg DVT on Doppler-anticoagulation started. 5/26>> Hb 6.7-1 unit of PRBC ordered 5/27>> melanotic appearing stools 5/28>> GI consult 5/30>> EGD with severe erosive esophagitis with contact oozing.  Vascular consult for IVC filter placement.  1 unit of PRBC transfused.  Significant studies: 5/25>> CXR: Chronic interstitial lung markings. 5/25>> CT head: No acute intracranial abnormality. 5/25>> renal ultrasound: No hydronephrosis 5/25>> x-ray pelvis: No acute osseous abnormality 5/25>> bilateral lower extremity Doppler: Left leg DVT involving posterior tibial/peroneal vein.  Significant microbiology data: None  Procedures: 5/30>> EGD: Severe LA grade D erosive esophagitis with contact oozing  Consults: GI Vascular surgery  Subjective: No further blood in stools.  Lying comfortably in bed.  Pain is well-controlled.  Objective: Vitals: Blood pressure (!) 130/46, pulse (!) 46, temperature 98.3 F (36.8 C), temperature source Oral, resp. rate 20, height 5\' 1"  (1.549 m), weight 64.3 kg, SpO2 92 %.   Exam: Gen Exam:Alert awake-not in any distress HEENT:atraumatic, normocephalic Chest: B/L clear to auscultation anteriorly CVS:S1S2 regular Abdomen:soft non tender, non distended Extremities:no  edema Neurology: Non focal Skin: no rash  Pertinent Labs/Radiology:    Latest Ref Rng & Units 09/06/2022    4:46 AM 09/05/2022    5:29 AM 09/04/2022    5:24 PM  CBC  WBC 4.0 - 10.5 K/uL 11.7  15.7  15.9   Hemoglobin 12.0 - 15.0 g/dL 8.7  7.5  7.6   Hematocrit 36.0 - 46.0 % 25.9  22.7  23.8   Platelets 150 - 400 K/uL 185  228  263     Lab Results  Component Value Date   NA 142 09/05/2022   K 4.1 09/05/2022   CL 112 (H) 09/05/2022   CO2 27 09/05/2022      Assessment/Plan: Acute toxic metabolic encephalopathy Secondary to a long-acting morphine use (recently started)/AKI Significant improvement in encephalopathy-and close to baseline.  However some internight confusion at night-suspect hospital delirium. Delirium precautions  AKI Likely hemodynamically mediated UA negative for proteinuria-renal ultrasound negative for hydronephrosis Creatinine continues to improve with just supportive care.  Hypokalemia/hypomagnesemia Repleted.  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   Has been transfused a total of 2 units of PRBC-last transfusion 5/30-Hb stable at 8.7.  Given multiple medical issues-goal is to keep Hb > 8.      Upper GI bleeding Occurred on 5/27-no melanotic stools for 48 hours EGD with severe erosive esophagitis with contact bleeding-likely in the etiology GI recommending PPI twice daily x 8  weeks followed by once daily, Carafate 1 g twice daily x 4 weeks Second look endoscopy in 3 to 4 months.    Left Leg DVT Initially on IV heparin-transitioned to Eliquis on 5/27 Unfortunately anticoagulation on hold since patient developed a GI  bleed. Given severity of anemia-risk for recurrent GI bleeding with anticoagulation-vascular surgery consulted-for IVC filter placement on 5/31. Will plan to hold anticoagulation for several weeks in order to let her recover/get PT/OT to see if she would tolerate chemotherapy that is currently being planned.  Do not want her to be at increased risk of GI bleeding-causing further debility/deconditioning.  Family agreeable with IVC filter placement/plan.  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   Discussed with Dr. Shirline Frees on 5/28-continue supportive care as we are doing.  He has no further recommendations. Plans were to initiate chemotherapy on 5/31-but given ongoing medical issues-this has been postponed-plans for patient to go to SNF-and get rehab to see if she can get some functional recovery-with follow-up reevaluation by oncology to see if she would be strong enough to tolerate chemotherapy  History of bioprosthetic aortic valve replacement 2015 Recent echo 7/23-showed stable prosthetic valve.  HTN BP stable on amlodipine Continue to hold lisinopril/HCTZ given resolving AKI. No beta-blocker due to history of bradycardia.  Debility/deconditioning Due to due to illness superimposed on underlying malignancy. PT/OT eval-SNF recommended  Deep tissue pressure injury to sacrum Present prior to hospitalization Appreciate wound care input Continue wound care per wound care RN.  Pressure Injury 08/31/22 Sacrum Mid Deep Tissue Pressure Injury - Purple or maroon localized area of discolored intact skin or blood-filled blister due to damage of underlying soft tissue from pressure and/or shear. marron, nonblancable (Active)  08/31/22 1300  Location: Sacrum  Location Orientation: Mid   Staging: Deep Tissue Pressure Injury - Purple or maroon localized area of discolored intact skin or blood-filled blister due to damage of underlying soft tissue from pressure and/or shear.  Wound Description (Comments): marron, nonblancable  Present on Admission: Yes    Nutrition Status: Nutrition Problem: Severe Malnutrition Etiology: chronic illness Signs/Symptoms: percent weight loss, energy intake < 75% for > or equal to 1 month Percent weight loss: 13 % Interventions: MVI, Education, Refer to RD note for recommendations, Nepro shake    BMI: Estimated body mass index is 26.78 kg/m as calculated from the following:   Height as of this encounter: 5\' 1"  (1.549 m).   Weight as of this encounter: 64.3 kg.   Code status:   Code Status: DNR discussed with son/patient-DNR-on 5/29.  DVT Prophylaxis: Eliquis on hold due to upper GI bleeding.   Family Communication: Son at bedside   Disposition Plan: Status is: Inpatient Remains inpatient appropriate because: Severity of illness   Planned Discharge Destination: SNF hopefully over the weekend.   Diet: Diet Order             Diet NPO time specified Except for: Sips with Meds  Diet effective now                     Antimicrobial agents: Anti-infectives (From admission, onward)    None        MEDICATIONS: Scheduled Meds:  acetaminophen  1,000 mg Oral Q8H   amLODipine  10 mg Oral Daily   dexamethasone  2 mg Oral Daily   feeding supplement (  NEPRO CARB STEADY)  237 mL Oral BID BM   lidocaine  1 patch Transdermal Q24H   multivitamin  1 tablet Oral QHS   pantoprazole (PROTONIX) IV  40 mg Intravenous Q12H   sevelamer carbonate  2.4 g Oral TID WC   sodium chloride flush  3 mL Intravenous Q12H   sucralfate  1 g Oral TID WC & HS   Continuous Infusions:  sodium chloride 100 mL/hr at 09/06/22 0538    PRN Meds:.hydrALAZINE, naLOXone (NARCAN)  injection, oxyCODONE   I have personally reviewed following labs and  imaging studies  LABORATORY DATA: CBC: Recent Labs  Lab 08/30/22 2348 08/31/22 0516 09/01/22 0112 09/02/22 0341 09/03/22 1731 09/04/22 0416 09/04/22 1724 09/05/22 0529 09/06/22 0446  WBC 13.5* 17.8* 12.5*   < > 14.2* 13.2* 15.9* 15.7* 11.7*  NEUTROABS 11.0* 13.5* 8.9*  --   --   --   --   --   --   HGB 8.2* 7.5* 6.7*   < > 8.3* 7.4* 7.6* 7.5* 8.7*  HCT 25.9* 24.2* 21.2*   < > 25.8* 22.3* 23.8* 22.7* 25.9*  MCV 95.2 94.5 92.2   < > 92.8 91.8 93.0 91.2 92.5  PLT 315 299 249   < > 249 228 263 228 185   < > = values in this interval not displayed.     Basic Metabolic Panel: Recent Labs  Lab 08/31/22 0516 08/31/22 1431 09/01/22 0112 09/02/22 0341 09/03/22 0136 09/03/22 1731 09/04/22 0416 09/05/22 0529  NA 128* 130* 132* 135 134* 138 138 142  K 3.6 3.2* 3.0* 3.1* 2.7* 4.1 3.6 4.1  CL 95* 98 101 105 107 108 107 112*  CO2 22 27 25 25 25 25 27 27   GLUCOSE 106* 115* 110* 126* 119* 131* 114* 114*  BUN 57* 55* 55* 60* 62* 55* 60* 59*  CREATININE 2.37* 2.08* 1.79* 1.45* 1.33* 1.20* 1.18* 1.16*  CALCIUM 9.5 9.5 9.0 9.1 8.8* 8.7* 8.6* 8.5*  MG 1.8  --  1.6* 1.9 1.9  --  2.0 1.8  PHOS >30.0* >30.0* >30.0* >30.0*  --   --   --   --      GFR: Estimated Creatinine Clearance: 31 mL/min (A) (by C-G formula based on SCr of 1.16 mg/dL (H)).  Liver Function Tests: Recent Labs  Lab 08/31/22 0031 08/31/22 0516 08/31/22 1431 09/01/22 0112  AST 55* 73*  --  51*  ALT 31 35  --  33  ALKPHOS 123 114  --  105  BILITOT 0.9 1.4*  --  0.8  PROT 11.0* 10.3*  --  9.1*  ALBUMIN <1.5* <1.5* <1.5* <1.5*    No results for input(s): "LIPASE", "AMYLASE" in the last 168 hours. Recent Labs  Lab 08/31/22 1431 09/01/22 0112  AMMONIA 20 28     Coagulation Profile: No results for input(s): "INR", "PROTIME" in the last 168 hours.  Cardiac Enzymes: Recent Labs  Lab 09/01/22 0112  CKTOTAL 119     BNP (last 3 results) No results for input(s): "PROBNP" in the last 8760  hours.  Lipid Profile: No results for input(s): "CHOL", "HDL", "LDLCALC", "TRIG", "CHOLHDL", "LDLDIRECT" in the last 72 hours.  Thyroid Function Tests: No results for input(s): "TSH", "T4TOTAL", "FREET4", "T3FREE", "THYROIDAB" in the last 72 hours.   Anemia Panel: No results for input(s): "VITAMINB12", "FOLATE", "FERRITIN", "TIBC", "IRON", "RETICCTPCT" in the last 72 hours.   Urine analysis:    Component Value Date/Time   COLORURINE YELLOW 08/31/2022 0045   APPEARANCEUR  HAZY (A) 08/31/2022 0045   LABSPEC 1.013 08/31/2022 0045   PHURINE 5.0 08/31/2022 0045   GLUCOSEU NEGATIVE 08/31/2022 0045   HGBUR MODERATE (A) 08/31/2022 0045   BILIRUBINUR NEGATIVE 08/31/2022 0045   KETONESUR 5 (A) 08/31/2022 0045   PROTEINUR NEGATIVE 08/31/2022 0045   UROBILINOGEN 0.2 10/12/2013 1117   NITRITE NEGATIVE 08/31/2022 0045   LEUKOCYTESUR NEGATIVE 08/31/2022 0045    Sepsis Labs: Lactic Acid, Venous No results found for: "LATICACIDVEN"  MICROBIOLOGY: No results found for this or any previous visit (from the past 240 hour(s)).  RADIOLOGY STUDIES/RESULTS: No results found.   LOS: 6 days   Jeoffrey Massed, MD  [facility]    To contact the attending provider between 7A-7P or the covering provider during after hours 7P- epigastrium.  Active the patient had multiple  Already on sevelamer 2.4 g failure 7A, please log into the web site [email or web address] and access using universal Lane password for that web site. If you do not have the password, please call the hospital operator.  09/06/2022, 8:13 AM   We have used sleeping calcaneal by volume minus by

## 2022-09-06 NOTE — Progress Notes (Signed)
SLP Cancellation Note  Patient Details Name: [name] MRN: [id number] DOB: [birth year]   Cancelled treatment:       Reason Eval/Treat Not Completed: Other (comment) (pt npo for IVC per notes, will continue efforts)  Rolena Infante, MS [facility] SLP Acute Rehab Services Office [phone]   [name], [name] 09/06/2022, 10:42 AM

## 2022-09-06 NOTE — Telephone Encounter (Signed)
Asking if there is any update on care management collaborating with pt team. Message sent to Care Manager.

## 2022-09-06 NOTE — Progress Notes (Signed)
Report given to [place], RN on [address], for pt going to room 2 s/p IVC filter placement. Pt's belongings brought to new room by this writer, primary RN Leavy Cella, with pt's son Verneita Griffes.

## 2022-09-06 NOTE — Progress Notes (Signed)
Daily Progress Note   Patient Name: [name]       Date: 09/06/2022 DOB: July 09, 1940  Age: 82 y.o. MRN#: [id number] Attending Physician: Maretta Bees, MD Primary Care Physician: [name], MD Admit Date: 08/30/2022  Reason for Consultation/Follow-up: Establishing goals of care, Pain control, and Psychosocial/spiritual support  Subjective: Medical records reviewed including progress notes, labs, and imaging. Patient assessed at the bedside. She appears to be resting comfortably. Her son is present visiting and states he has not seen any signs of discomfort since he arrived yesterday evening. Discussed family request for nursing assistance with Diplomatic Services operational officer.  Created space and opportunity for family's thoughts and feelings. Reviewed their previous conversations with my colleague Lorinda Creed NP. Family share that they have some concern about patient not being seen by wound care in a few days and wonder if this could be re-visited for any further recommendations prior to anticipated discharge over the weekend. Offered to consult WOC team on their behalf and they are appreciative. They have narrowed down their SNF choices and continue to hope for patient's improvement with rehab. Patient's son Ferne Reus was called to participate in the conversation as well.  Outpatient palliative care was explained and offered to both sons. Patient's sons are agreeable with placing a referral for support and ongoing GOC discussions after discharge.  Questions and concerns addressed. PMT will continue to support holistically.   Length of Stay: 6   Physical Exam Vitals reviewed.  Constitutional:      General: She is not in acute distress.    Appearance: She is ill-appearing.     Interventions: Nasal cannula in  place.     Comments: 2L  Cardiovascular:     Rate and Rhythm: Bradycardia present.  Pulmonary:     Effort: Pulmonary effort is normal. No respiratory distress.  Neurological:     Mental Status: She is easily aroused.             Vital Signs: BP (!) 130/46 (BP Location: Right Arm)   Pulse (!) 46   Temp 98.3 F (36.8 C) (Oral)   Resp 20   Ht 5\' 1"  (1.549 m)   Wt 64.3 kg   SpO2 92%   BMI 26.78 kg/m  SpO2: SpO2: 92 % O2 Device: O2 Device: Room Air O2 Flow Rate:  O2 Flow Rate (L/min): 1 L/min   Palliative Care Assessment & Plan   Patient Profile: 82 y.o. female   admitted on 08/30/2022 with  history of multiple myeloma/diagnosed in May 2024, HTN-who presented with encephalopathy in the setting of narcotic use/AKI.    Patient was managed with supportive care with resolution of encephalopathy-improvement in renal function-however further hospital course complicated by left lower extremity DVT, worsening anemia and melanotic stools.   Assessment: Goals of care conversation AKI, improving Acute blood loss anemia, stable Hb LLE DVT MM with widespread osseous metastases Bone pain due to chronic pain and cancer  Recommendations/Plan: Continue DNR/DNI Continue full scope otherwise WOC consult order placed at family's request, assistance appreciated Patient's family is agreeable to outpatient palliative care, TOC consulted and assistance is appreciated Psychosocial and emotional support provided PMT will continue to follow and support as needed   Prognosis:  Guarded with high risk for further complications and rehospitalization  Discharge Planning: Skilled Nursing Facility for rehab with Palliative care service follow-up  Care plan was discussed with Patient's sons   MDM high         Delois Tolbert Jeni Salles, PA-C  Palliative Medicine Team Team phone # [phone]  Thank you for allowing the Palliative Medicine Team to assist in the care of this patient. Please utilize  secure chat with additional questions, if there is no response within 30 minutes please call the above phone number.  Palliative Medicine Team providers are available by phone from 7am to 7pm daily and can be reached through the team cell phone.  Should this patient require assistance outside of these hours, please call the patient's attending physician.

## 2022-09-06 NOTE — Progress Notes (Signed)
Auth for [facility] has been approved 6/1 - 6/4 NRD 6/4 Reola Calkins ID [id number]

## 2022-09-06 NOTE — TOC Progression Note (Addendum)
Transition of Care [facility]) - Progression Note    Patient Details  Name: [name] MRN: [id number] Date of Birth: 1941/02/01  Transition of Care [facility]) CM/SW Contact  Erin Sons, [place] Phone Number: 09/06/2022, 12:20 PM  Clinical Narrative:     CSW called pt's son and left voicemail requesting return call.   CSW received voicemail from pt's DIL, Bjorn Loser, stating their first choice in facility would be for [facility].   CSW confirmed bed offer with [facility]. They can admit Sunday pending insurance auth. [facility] has been in contact with family and plans to complete paperwork today with family.   CSW called and left voicemail with DIL providing update of plan for [facility] Sunday.  Auth request submitted by [facility] CMA.   Expected Discharge Plan: Skilled Nursing Facility Barriers to Discharge: Continued Medical Work up, English as a second language teacher  Expected Discharge Plan and Services In-house Referral: Clinical Social Work   Post Acute Care Choice: Skilled Nursing Facility Living arrangements for the past 2 months: Single Family Home                                       Social Determinants of Health (SDOH) Interventions SDOH Screenings   Food Insecurity: No Food Insecurity (08/31/2022)  Housing: Low Risk  (08/31/2022)  Transportation Needs: No Transportation Needs (08/31/2022)  Utilities: Not At Risk (08/31/2022)  Tobacco Use: Low Risk  (09/05/2022)    Readmission Risk Interventions    09/03/2022    9:28 AM  Readmission Risk Prevention Plan  Transportation Screening Complete  Medication Review (RN Care Manager) Complete  PCP or Specialist appointment within 3-5 days of discharge Complete  HRI or Home Care Consult Complete  SW Recovery Care/Counseling Consult Complete  Palliative Care Screening Not Applicable  Skilled Nursing Facility Complete

## 2022-09-06 NOTE — Consult Note (Addendum)
WOC Nurse re-consult Note: Reason for Consult: Refer to previous WOC consult notes on 5/25 and 5/28. Requested to reassess sacrum wound. Previously note Deep tissue pressure injury (DTPI) has evolved to Unstageable. Discussed appearance and plan of care with daughter at the bedside; she verbalized understanding when I explained that DTPI frequently evolve in this pattern within 7-10 days.  8X6cm tightly adhered eschar, no odor, fluctuance, or drainage. Crosshatched using a scalpel to allow better penetration of Medihoney. Pt tolerated without pain or bleeding. Topical treatment orders provided for bedside nurses to perform as follows to assist with removal of nonviable tissue. Dietician consult requested to optimize protein intake for healing. Air mattress ordered to reduce pressure.  It would be beneficial to have an air mattress at the rehab facility when she is transferred; please order if desired.  Pressure Injury POA: Yes Dressing procedure/placement/frequency: Apply Medihoney to sacrum wound Q day, then cover with foam dressing.  Change foam dressing Q 3 days or PRN soiling. Please re-consult if further assistance is needed.  Thank-you,  Cammie Mcgee MSN, RN, CWOCN, [place], CNS [phone]

## 2022-09-06 NOTE — Telephone Encounter (Signed)
Social worker is involved , not care management. [name] said that pt will be discharged to [facility] for rehab. He said she had a better night last night and today she is "having a Filter placed  for small blood clot"

## 2022-09-06 NOTE — Progress Notes (Addendum)
  Progress Note    09/06/2022 7:31 AM Hospital Day 6  Subjective:  resting comfortably; no questions    Vitals:   09/05/22 2000 09/06/22 0400  BP: (!) 144/58 (!) 130/46  Pulse: 65 (!) 46  Resp: 18 20  Temp: 98.3 F (36.8 C) 98.3 F (36.8 C)  SpO2: 91% 92%    Physical Exam: General:  no distress Lungs:  non labored  CBC    Component Value Date/Time   WBC 11.7 (H) 09/06/2022 0446   RBC 2.80 (L) 09/06/2022 0446   HGB 8.7 (L) 09/06/2022 0446   HGB 8.3 (L) 08/22/2022 1059   HCT 25.9 (L) 09/06/2022 0446   PLT 185 09/06/2022 0446   PLT 341 08/22/2022 1059   MCV 92.5 09/06/2022 0446   MCH 31.1 09/06/2022 0446   MCHC 33.6 09/06/2022 0446   RDW 16.8 (H) 09/06/2022 0446   LYMPHSABS 2.6 09/01/2022 0112   MONOABS 0.9 09/01/2022 0112   EOSABS 0.0 09/01/2022 0112   BASOSABS 0.0 09/01/2022 0112    BMET    Component Value Date/Time   NA 142 09/05/2022 0529   NA 141 10/06/2020 0939   K 4.1 09/05/2022 0529   CL 112 (H) 09/05/2022 0529   CO2 27 09/05/2022 0529   GLUCOSE 114 (H) 09/05/2022 0529   BUN 59 (H) 09/05/2022 0529   BUN 17 10/06/2020 0939   CREATININE 1.16 (H) 09/05/2022 0529   CREATININE 1.03 (H) 08/22/2022 1059   CREATININE 0.85 01/29/2016 0910   CALCIUM 8.5 (L) 09/05/2022 0529   GFRNONAA 46 (L) 09/05/2022 0529   GFRNONAA 54 (L) 08/22/2022 1059   GFRAA 56 (L) 03/04/2018 0840    INR    Component Value Date/Time   INR 1.0 01/24/2022 1407     Intake/Output Summary (Last 24 hours) at 09/06/2022 0731 Last data filed at 09/06/2022 0538 Gross per 24 hour  Intake 641.67 ml  Output 0 ml  Net 641.67 ml     Assessment/Plan:  82 y.o. female with left tibial vein DVT and plan for IVC filter today  Hospital Day 6  -pt's son at bedside.  Pt resting comfortably.  No questions currently.  -plan for IVC filter early afternoon.  Continue npo-discussed with pt and son.   Doreatha Massed, PA-C [facility] [phone] 09/06/2022 7:31  AM  VASCULAR STAFF ADDENDUM: I have independently interviewed and examined the patient. I agree with the above.   Rande Brunt. Lenell Antu, MD [facility] Phone Number: [phone] 09/06/2022 2:27 PM

## 2022-09-07 DIAGNOSIS — R404 Transient alteration of awareness: Secondary | ICD-10-CM

## 2022-09-07 LAB — CBC
HCT: 26.6 % — ABNORMAL LOW (ref 36.0–46.0)
Hemoglobin: 8.9 g/dL — ABNORMAL LOW (ref 12.0–15.0)
MCH: 29.8 pg (ref 26.0–34.0)
MCHC: 33.5 g/dL (ref 30.0–36.0)
MCV: 89 fL (ref 80.0–100.0)
Platelets: 226 10*3/uL (ref 150–400)
RBC: 2.99 MIL/uL — ABNORMAL LOW (ref 3.87–5.11)
RDW: 16.7 % — ABNORMAL HIGH (ref 11.5–15.5)
WBC: 14.8 10*3/uL — ABNORMAL HIGH (ref 4.0–10.5)
nRBC: 0 % (ref 0.0–0.2)

## 2022-09-07 LAB — BASIC METABOLIC PANEL
Anion gap: 4 — ABNORMAL LOW (ref 5–15)
BUN: 47 mg/dL — ABNORMAL HIGH (ref 8–23)
CO2: 25 mmol/L (ref 22–32)
Calcium: 7.6 mg/dL — ABNORMAL LOW (ref 8.9–10.3)
Chloride: 111 mmol/L (ref 98–111)
Creatinine, Ser: 0.98 mg/dL (ref 0.44–1.00)
GFR, Estimated: 57 mL/min — ABNORMAL LOW (ref >60)
Glucose, Bld: 130 mg/dL — ABNORMAL HIGH (ref 70–99)
Potassium: 3.5 mmol/L (ref 3.5–5.1)
Sodium: 140 mmol/L (ref 135–145)

## 2022-09-07 LAB — LIPID PANEL
Cholesterol: 85 mg/dL (ref 0–200)
HDL: <10 mg/dL — ABNORMAL LOW (ref >40)
Triglycerides: 83 mg/dL (ref <150)
VLDL: 17 mg/dL (ref 0–40)

## 2022-09-07 LAB — GLUCOSE, CAPILLARY: Glucose-Capillary: 171 mg/dL — ABNORMAL HIGH (ref 70–99)

## 2022-09-07 MED ORDER — HYDROCODONE-ACETAMINOPHEN 5-325 MG PO TABS: 1.0000 | ORAL_TABLET | Freq: Every day | ORAL | 0 refills | Status: AC | PRN

## 2022-09-07 MED ORDER — RENA-VITE PO TABS: 1.0000 | ORAL_TABLET | Freq: Every day | ORAL | 0 refills | Status: DC

## 2022-09-07 MED ORDER — PANTOPRAZOLE SODIUM 40 MG PO TBEC: 40.0000 mg | DELAYED_RELEASE_TABLET | Freq: Two times a day (BID) | ORAL | 0 refills | Status: DC

## 2022-09-07 MED ORDER — PANTOPRAZOLE SODIUM 40 MG PO TBEC
40.0000 mg | DELAYED_RELEASE_TABLET | Freq: Two times a day (BID) | ORAL | Status: DC
  Administered 2022-09-07 – 2022-09-08 (×2): 40 mg via ORAL
  Filled 2022-09-07 (×2): qty 1

## 2022-09-07 MED ORDER — NEPRO/CARBSTEADY PO LIQD: 237.0000 mL | Freq: Two times a day (BID) | ORAL | 0 refills | Status: DC

## 2022-09-07 MED ORDER — AMLODIPINE BESYLATE 10 MG PO TABS: 10.0000 mg | ORAL_TABLET | Freq: Every day | ORAL | Status: DC

## 2022-09-07 MED ORDER — DEXAMETHASONE 2 MG PO TABS: 2.0000 mg | ORAL_TABLET | Freq: Every day | ORAL | Status: DC

## 2022-09-07 MED ORDER — VITAMIN C 500 MG PO TABS
500.0000 mg | ORAL_TABLET | Freq: Every day | ORAL | Status: DC
  Administered 2022-09-07 – 2022-09-08 (×2): 500 mg via ORAL
  Filled 2022-09-07 (×2): qty 1

## 2022-09-07 MED ORDER — ZINC SULFATE 220 (50 ZN) MG PO CAPS
220.0000 mg | ORAL_CAPSULE | Freq: Every day | ORAL | Status: DC
  Administered 2022-09-07 – 2022-09-08 (×2): 220 mg via ORAL
  Filled 2022-09-07 (×2): qty 1

## 2022-09-07 NOTE — Progress Notes (Signed)
Nutrition Follow-up  DOCUMENTATION CODES:   Severe malnutrition in context of chronic illness  INTERVENTION:  Continue regular diet to provide widest variety of menu options to support increased nutrition needs and wound healing Continue Nepro Shake po BID, each supplement provides 425 kcal and 19 grams protein Vitamin C 500mg  daily to support wound healing Zinc 220mg  x14 days to support wound healing  NUTRITION DIAGNOSIS:   Severe Malnutrition related to chronic illness as evidenced by percent weight loss, energy intake < 75% for > or equal to 1 month. - remains applicable  GOAL:   Patient will meet greater than or equal to 90% of their needs - goal unmet, addressing via meals and nutrition supplements  MONITOR:   PO intake, I & O's, Supplement acceptance, Labs, Weight trends, Skin  REASON FOR ASSESSMENT:   Consult Wound healing (Pt has an unstageable pressure injury to the sacrum)  ASSESSMENT:   82 y.o. female with PMHx including multiple myeloma, HTN presents with encephalopathy in the setting of narcotic use/AKI and decreased po intake  5/30- s/p EGD findings of erosive esophagitis with contact oozing  Noted plans to d/c to SNF/rehab, likely tomorrow.   DTPI now evolved to unstageable per WOC.  Attempted to reach pt via phone call to room to follow up on nutrition supplements previously ordered and to assess PO intake as there is unfortunately limited documentation on file to review throughout admission. No answer at this time.   Per review of MAR, pt has been accepting Nepro shakes when not off the unit for procedure (received 7 throughout admission).  Spoke with RN who reports pt really enjoys Nepro and Ensure supplements.   Meal completions:  5/27: 25% lunch, 25% dinner   Medications: decadron, medihoney, rena-vit, protonix, renvela, carafate  Labs: BUN 47, Ca 7.6, anion gap 4, GFR 57, CBG 171  Diet Order:   Diet Order             Diet - low sodium heart  healthy           Diet regular Room service appropriate? Yes; Fluid consistency: Thin  Diet effective now                   EDUCATION NEEDS:   Education needs have been addressed  Skin:  Skin Assessment: Skin Integrity Issues: Skin Integrity Issues:: Unstageable DTI: sacrum Unstageable: sacrum- 8X6cm tightly adhered eschar per WOC  Last BM:  5/29  Height:   Ht Readings from Last 1 Encounters:  08/31/22 5\' 1"  (1.549 m)    Weight:   Wt Readings from Last 1 Encounters:  09/07/22 67.6 kg   BMI:  Body mass index is 28.16 kg/m.  Estimated Nutritional Needs:   Kcal:  1500-1700 kcal  Protein:  82-102 g  Fluid:  >1.5 L  Drusilla Kanner, RDN, LDN Clinical Nutrition

## 2022-09-07 NOTE — Progress Notes (Signed)
PROGRESS NOTE [name]  UEA:[id number] DOB: November 08, 1940 DOA: 08/30/2022 PCP: [name], MD  Brief Narrative/Hospital Course: 82 y.o.f w/ multiple myeloma, HTN-who presented with encephalopathy in the setting of narcotic use/AKI.  Chest x-ray with chronic interstitial lung markings CT head no acute finding renal ultrasound no hydronephrosis x-ray pelvis no acute osseous finding. Patient was managed with supportive care with resolution of encephalopathy-improvement in renal function-however further hospital course complicated by left lower extremity DVT placed on anticoagulation but had, worsening anemia and melanotic stools, needing blood transfusion she was seen by GI underwent EGD 5/30 showing erosive esophagitis severe with contact oozing vascular surgery were consulted for IVC placement. 5/31 underwent IVC filter placement by Dr. Lenell Antu who advised follow-up with ENT in 1 to 2 months to schedule IVC filter retrieval. Plan is to hold off on anticoagulation for several weeks in order to let her recover, [place] followed by IR PCP and oncology team. PT has recommended skilled nursing facility and has been approved for 6/1. Overnight 5/31 afebrile hemodynamically stable not hypoxic, routine labs shows AKI resolved with creatinine 0.9, stable hemoglobin 8.9 g mild leukocytosis.     Subjective: Seen and examined this morning, patient's family at the bedside patient resting comfortably has no new complaints alert awake Eager to go to skilled nursing facility but unable to be accepted today   Assessment and Plan:  Acute toxic metabolic encephalopathy: Secondary to long-acting morphine in the setting of AKI.  Mental status improved, pain medication discontinued continue 1 to twice a day Percocet as needed, Tylenol.  Continue delirium precaution fall precautions supportive care.  AKI resolved,UA negative for proteinuria-renal ultrasound negative for hydronephrosis   Hypokalemia/hypomagnesemia  resolved Hyperphosphatemia: Secondary to bone tumor/osteoclastic activity, cont Renvela binders. level below Recent Labs  Lab 08/31/22 1431 09/01/22 0112 09/02/22 0341 09/03/22 0136 09/03/22 1731 09/04/22 0416 09/05/22 0529 09/07/22 0149  K 3.2* 3.0* 3.1* 2.7* 4.1 3.6 4.1 3.5  CALCIUM 9.5 9.0 9.1 8.8* 8.7* 8.6* 8.5* 7.6*  MG  --  1.6* 1.9 1.9  --  2.0 1.8  --   PHOS >30.0* >30.0* >30.0*  --   --   --   --   --     No results found for: "PTH"   Normocytic anemia Acute blood loss anemia: Anemia multifactorial with baseline normocytic anemia due to chronic disease/malignancy and blood loss anemia due to GI bleeding.  Hemoglobin is stable monitor and keep above 8, so far received 2 units of blood transfusion Recent Labs  Lab 09/04/22 0416 09/04/22 1724 09/05/22 0529 09/06/22 0446 09/07/22 0149  HGB 7.4* 7.6* 7.5* 8.7* 8.9*  HCT 22.3* 23.8* 22.7* 25.9* 26.6*    Upper GI bleeding: Occurred on 5/27 currently stable resolved, underwent EGD with severe esophageal esophagitis with contact bleeding likely the etiology.  For the continue PPI twice daily x 8 weeks then once a day, continue Carafate 4 weeks Second Look endoscopy in 3 to 4 months  Left Leg DVT: Initially on IV heparin transitioned to Eliquis 5/27>Unfortunately anticoagulation  held since patient developed a GI bleeding. Given severity of anemia-risk for recurrent GI bleeding with anticoagulation-vascular surgery consulted and s/p IVC filter placement done 5/31. Continue to hold anticoagulation at least 2 weeks in order to let her recover continue PT OT to see if she would tolerate chemotherapy, she is to follow-up with Dr. Arbutus Ped to consider resumption.  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  Only needing opiates  once a day. Previously discussed with Dr. Shirline Frees on 5/28 by [facility]- advised to continue supportive care- He had no further recommendations and his plan were to initiate chemotherapy on 5/31-but given ongoing medical issues-this has been postponed-plans for patient to go to SNF-and get rehab to see if she can get some functional recovery-with follow-up reevaluation by oncology to see if she would be strong enough to tolerate chemotherapy   History of bioprosthetic aortic valve replacement 2015:Recent echo 7/23-showed stable prosthetic valve.   HTN: BP well-controlled continue amlodipine continue to hold lisinopril HCTZ.No beta-blocker due to history of bradycardia.   Debility/deconditioning: Due to due to illness superimposed on underlying malignancy. Cont ptot and planning for skilled nursing facility tomorrow   Pressure ulcer sacrum unstageable POA wound care as below Pressure Injury 08/31/22 Sacrum Mid Unstageable - Full thickness tissue loss in which the base of the injury is covered by slough (yellow, tan, gray, green or brown) and/or eschar (tan, brown or black) in the wound bed. previously noted deep tissue press (Active)  08/31/22 1300  Location: Sacrum  Location Orientation: Mid  Staging: Unstageable - Full thickness tissue loss in which the base of the injury is covered by slough (yellow, tan, gray, green or brown) and/or eschar (tan, brown or black) in the wound bed.  Wound Description (Comments): previously noted deep tissue pressure injury has evolved to Unstageable  Present on Admission: Yes   Severe malnutrition RD consulted augment diet as below Nutrition Problem: Severe Malnutrition Etiology: chronic illness Signs/Symptoms: percent weight loss, energy intake < 75% for > or equal to 1 month Percent weight loss: 13 % Interventions: MVI, Education, Refer to RD note for recommendations, Nepro shake    DVT prophylaxis:  Code Status:   Code Status: DNR Family Communication: plan  of care discussed with patient/family at bedside. Patient status is:  inpatient because of awaiting snf Level of care: Progressive Cardiac   Dispo: The patient is from: home            Anticipated disposition: SNF, per [facility] SNF will be abel to accept her tomorrow  Objective: Vitals last 24 hrs: Vitals:   09/07/22 0301 09/07/22 0416 09/07/22 0828 09/07/22 1124  BP: (!) 148/62  (!) 152/56 (!) 130/45  Pulse:   64   Resp: (!) 22  13 15   Temp:   98 F (36.7 C) 98.1 F (36.7 C)  TempSrc:   Oral Oral  SpO2:   96%   Weight:  67.6 kg    Height:       Weight change: 3.3 kg  Physical Examination: General exam: alert awake, older than stated age HEENT:Oral mucosa moist, Ear/Nose WNL grossly Respiratory system: bilaterally clear BS, no use of accessory muscle Cardiovascular system: S1 & S2 +, No JVD. Gastrointestinal system: Abdomen soft,NT,ND, BS+ Nervous System:Alert, awake, moving extremities. Extremities: LE edema NEG,distal peripheral pulses palpable.  Skin: No rashes,no icterus. MSK: Normal muscle bulk,tone, power  Medications reviewed:  Scheduled Meds:  acetaminophen  1,000 mg Oral Q8H   amLODipine  10 mg Oral Daily   ascorbic acid  500 mg Oral Daily   dexamethasone  2 mg Oral Daily   feeding supplement (NEPRO CARB STEADY)  237 mL Oral BID BM   leptospermum manuka honey  1 Application Topical Daily   lidocaine  1 patch Transdermal Q24H   multivitamin  1 tablet  Oral QHS   pantoprazole (PROTONIX) IV  40 mg Intravenous Q12H   sevelamer carbonate  2.4 g Oral TID WC   sodium chloride flush  3 mL Intravenous Q12H   sodium chloride flush  3 mL Intravenous Q12H   sucralfate  1 g Oral TID WC & HS   zinc sulfate  220 mg Oral Daily   Continuous Infusions:  sodium chloride Stopped (09/06/22 1333)   sodium chloride     Diet Order             Diet - low sodium heart healthy           Diet regular Room service appropriate? Yes; Fluid consistency: Thin  Diet effective now                   Intake/Output Summary (Last 24 hours) at 09/07/2022 1132 Last data filed at 09/07/2022 0514 Gross per 24 hour  Intake 2158.96 ml  Output --  Net 2158.96 ml   Net IO Since Admission: 6,094.85 mL [09/07/22 1132]  Wt Readings from Last 3 Encounters:  09/07/22 67.6 kg  08/27/22 64.3 kg  08/20/22 70.1 kg     Unresulted Labs (From admission, onward)    None     ata Reviewed: I have personally reviewed following labs and imaging studies CBC: Recent Labs  Lab 09/01/22 0112 09/02/22 0341 09/04/22 0416 09/04/22 1724 09/05/22 0529 09/06/22 0446 09/07/22 0149  WBC 12.5*   < > 13.2* 15.9* 15.7* 11.7* 14.8*  NEUTROABS 8.9*  --   --   --   --   --   --   HGB 6.7*   < > 7.4* 7.6* 7.5* 8.7* 8.9*  HCT 21.2*   < > 22.3* 23.8* 22.7* 25.9* 26.6*  MCV 92.2   < > 91.8 93.0 91.2 92.5 89.0  PLT 249   < > 228 263 228 185 226   < > = values in this interval not displayed.   Basic Metabolic Panel: Recent Labs  Lab 08/31/22 1431 09/01/22 0112 09/02/22 0341 09/03/22 0136 09/03/22 1731 09/04/22 0416 09/05/22 0529 09/07/22 0149  NA 130* 132* 135 134* 138 138 142 140  K 3.2* 3.0* 3.1* 2.7* 4.1 3.6 4.1 3.5  CL 98 101 105 107 108 107 112* 111  CO2 27 25 25 25 25 27 27 25   GLUCOSE 115* 110* 126* 119* 131* 114* 114* 130*  BUN 55* 55* 60* 62* 55* 60* 59* 47*  CREATININE 2.08* 1.79* 1.45* 1.33* 1.20* 1.18* 1.16* 0.98  CALCIUM 9.5 9.0 9.1 8.8* 8.7* 8.6* 8.5* 7.6*  MG  --  1.6* 1.9 1.9  --  2.0 1.8  --   PHOS >30.0* >30.0* >30.0*  --   --   --   --   --    GFR: Estimated Creatinine Clearance: 37.6 mL/min (by C-G formula based on SCr of 0.98 mg/dL). Liver Function Tests: Recent Labs  Lab 08/31/22 1431 09/01/22 0112  AST  --  51*  ALT  --  33  ALKPHOS  --  105  BILITOT  --  0.8  PROT  --  9.1*  ALBUMIN <1.5* <1.5*   No results for input(s): "LIPASE", "AMYLASE" in the last 168 hours. Recent Labs  Lab 08/31/22 1431 09/01/22 0112  AMMONIA 20 28   Recent Labs  Lab  09/07/22 0640  GLUCAP 171*   Lipid Profile: Recent Labs    09/07/22 0149  CHOL 85  HDL <10*  LDLCALC NOT CALCULATED  TRIG 83  CHOLHDL NOT CALCULATED   Antimicrobials: Anti-infectives (From admission, onward)    None      Culture/Microbiology    Component Value Date/Time   SDES URINE, CLEAN CATCH 09/23/2013 0202   SPECREQUEST NONE 09/23/2013 0202   CULT NO GROWTH Performed at [facility] 09/23/2013 0202   REPTSTATUS 09/24/2013 FINAL 09/23/2013 0202    Radiology Studies: PERIPHERAL VASCULAR CATHETERIZATION  Result Date: 09/06/2022 DATE OF SERVICE: 09/06/2022  PATIENT:  [name]  82 y.o. female  PRE-OPERATIVE DIAGNOSIS:  DVT, relative contraindication to anticoagulation  POST-OPERATIVE DIAGNOSIS:  Same  PROCEDURE:  Ultrasound guided right common femoral vein placement IVC filter placement Conscious sedation (10 minutes)  SURGEON:  Surgeon(s) and Role:    * Leonie Douglas, MD - Primary  ASSISTANT: none  ANESTHESIA:   local and IV sedation  EBL: minimal  BLOOD ADMINISTERED:none  DRAINS: none  LOCAL MEDICATIONS USED:  LIDOCAINE  SPECIMEN:  none  COUNTS: confirmed correct.  TOURNIQUET:  none  PATIENT DISPOSITION:  PACU - hemodynamically stable.  Delay start of Pharmacological VTE agent (>24hrs) due to surgical blood loss or risk of bleeding: no  INDICATION FOR PROCEDURE: [name] is a 82 y.o. female with DVT, relative contraindication to anticoagulation. After careful discussion of risks, benefits, and alternatives the patient was offered IVC filter placement. The patient understood and wished to proceed.  OPERATIVE FINDINGS: successful IVC filter placement  DESCRIPTION OF PROCEDURE: After identification of the patient in the pre-operative holding area, the patient was transferred to the operating room. The patient was positioned supine on the operating room table. Anesthesia was induced. The groins were prepped and draped in standard fashion. A surgical pause was  performed confirming correct patient, procedure, and operative location.  Ultrasound guidance was used to obtain micropuncture access in the right common femoral vein.  Through the micro sheath, Bentson wire was navigated into the IVC.  The tract was dilated.  A Cook select IVC filter system was prepared per [employer] instructions and brought onto the table.  The sheath and flush catheter were then advanced into the infrarenal IVC.  (Venogram was performed.  We marked the renal veins.  An IVC filter was deployed under the renal veins.  This was uncomplicated.  A follow-up venogram was performed showing satisfactory IVC filter placement.  All endovascular equipment was removed.  Manual pressure was held on the vena puncture access site with good result.  Upon completion of the case instrument and sharps counts were confirmed correct. The patient was transferred to the PACU in good condition. I was present for all portions of the procedure.  FOLLOW UP PLAN: I will see the patient again in 1 to 2 months to schedule IVC filter retrieval.  Please call for questions in the interim.  Rande Brunt. Lenell Antu, MD [facility] Phone Number: [phone] 09/06/2022 2:27 PM      LOS: 7 days   Lanae Boast, MD [facility]  09/07/2022, 11:32 AM

## 2022-09-07 NOTE — Progress Notes (Signed)
Mobility Specialist Progress Note    09/07/22 1155  Mobility  Activity Ambulated with assistance in hallway  Level of Assistance Minimal assist, patient does 75% or more  Assistive Device Front wheel walker  Distance Ambulated (ft) 180 ft  Activity Response Tolerated well  Mobility Referral Yes  $Mobility charge 1 Mobility  Mobility Specialist Start Time (ACUTE ONLY) 1137  Mobility Specialist Stop Time (ACUTE ONLY) 1154  Mobility Specialist Time Calculation (min) (ACUTE ONLY) 17 min   Pre-Mobility: 65 HR During Mobility: 87 HR Post-Mobility: 67 HR  Pt received in bed and agreeable. No complaints on walk. Returned to chair with call bell in reach and visitor present.   [place] Mobility Specialist  Please Neurosurgeon or Rehab Office at [phone]

## 2022-09-07 NOTE — TOC Progression Note (Signed)
Transition of Care [facility]) - Progression Note    Patient Details  Name: [name] MRN: [id number] Date of Birth: [birth year]  Transition of Care [facility]) CM/SW Contact  Leander Rams, LCSW Phone Number: 09/07/2022, 11:12 AM  Clinical Narrative:    CSW contacted [facility] with [facility] to confirm if pt can admit to SNF today. Janie notified CSW they can admit pt tomorrow. CSW made MD aware.   TOC will continue to follow.    Expected Discharge Plan: Skilled Nursing Facility Barriers to Discharge: Continued Medical Work up, English as a second language teacher  Expected Discharge Plan and Services In-house Referral: Clinical Social Work   Post Acute Care Choice: Skilled Nursing Facility Living arrangements for the past 2 months: Single Family Home                                       Social Determinants of Health (SDOH) Interventions SDOH Screenings   Food Insecurity: No Food Insecurity (08/31/2022)  Housing: Low Risk  (08/31/2022)  Transportation Needs: No Transportation Needs (08/31/2022)  Utilities: Not At Risk (08/31/2022)  Tobacco Use: Low Risk  (09/05/2022)    Readmission Risk Interventions    09/03/2022    9:28 AM  Readmission Risk Prevention Plan  Transportation Screening Complete  Medication Review (RN Care Manager) Complete  PCP or Specialist appointment within 3-5 days of discharge Complete  HRI or Home Care Consult Complete  SW Recovery Care/Counseling Consult Complete  Palliative Care Screening Not Applicable  Skilled Nursing Facility Complete   Oletta Lamas, MSW, [place], LCASA Transitions of Care  Clinical Social Worker I

## 2022-09-07 NOTE — Hospital Course (Addendum)
82 y.o.f w/ multiple myeloma, HTN-who presented with encephalopathy in the setting of narcotic use/AKI.  Chest x-ray with chronic interstitial lung markings CT head no acute finding renal ultrasound no hydronephrosis x-ray pelvis no acute osseous finding. Patient was managed with supportive care with resolution of encephalopathy-improvement in renal function-however further hospital course complicated by left lower extremity DVT placed on anticoagulation but had, worsening anemia and melanotic stools, needing blood transfusion she was seen by GI underwent EGD 5/30 showing erosive esophagitis severe with contact oozing vascular surgery were consulted for IVC placement. 5/31 underwent IVC filter placement by Dr. Lenell Antu who advised follow-up with ENT in 1 to 2 months to schedule IVC filter retrieval. Plan is to hold off on anticoagulation for several weeks in order to let her recover,should follow up w/ oncology to address as OP. PT has recommended skilled nursing facility and awaiting palcement

## 2022-09-07 NOTE — Progress Notes (Signed)
PHARMACIST LIPID MONITORING   [name] is a 82 y.o. female admitted on 08/30/2022 with DVT, severe anemia, metastatic melanoma.  Pharmacy has been consulted to optimize lipid-lowering therapy with the indication of secondary prevention for clinical ASCVD.  Recent Labs:  Lipid Panel (last 6 months):   Lab Results  Component Value Date   CHOL 85 09/07/2022   TRIG 83 09/07/2022   HDL <10 (L) 09/07/2022   CHOLHDL NOT CALCULATED 09/07/2022   VLDL 17 09/07/2022   LDLCALC NOT CALCULATED 09/07/2022    Hepatic function panel (last 6 months):   Lab Results  Component Value Date   AST 51 (H) 09/01/2022   ALT 33 09/01/2022   ALKPHOS 105 09/01/2022   BILITOT 0.8 09/01/2022   BILIDIR 0.1 08/31/2022   IBILI 0.8 08/31/2022    SCr (since admission):   Serum creatinine: 0.98 mg/dL 16/10/96 0454 Estimated creatinine clearance: 37.6 mL/min  Current therapy and lipid therapy tolerance Current lipid-lowering therapy: None  Previous lipid-lowering therapies: atorvastatin, rosuvastatin, simvastatin, pravastatin, ezetimibe  Documented or reported allergies or intolerances to lipid-lowering therapies: myalgias, stomach pain   Assessment:   Patient confirms intolerance to four statins and ezetimibe previously. Inconclusive lipid panel this admission, calculated LDL from 01/07/22 was ~155. Patient was prescribed Repatha in July 2023 and a prior Berkley Harvey was approved thru December 2023, however, it is unclear whether patient ever had this medication. Patient is not interested in changes in lipid therapy at this time.   Plan:   Per patient preference, no changes in lipid therapy   Andreas Ohm, PharmD Pharmacy Resident  09/07/2022 8:54 AM

## 2022-09-08 DIAGNOSIS — L8990 Pressure ulcer of unspecified site, unspecified stage: Secondary | ICD-10-CM | POA: Diagnosis not present

## 2022-09-08 DIAGNOSIS — D649 Anemia, unspecified: Secondary | ICD-10-CM | POA: Diagnosis not present

## 2022-09-08 DIAGNOSIS — I959 Hypotension, unspecified: Secondary | ICD-10-CM | POA: Diagnosis not present

## 2022-09-08 DIAGNOSIS — Z515 Encounter for palliative care: Secondary | ICD-10-CM | POA: Diagnosis not present

## 2022-09-08 DIAGNOSIS — M109 Gout, unspecified: Secondary | ICD-10-CM | POA: Diagnosis not present

## 2022-09-08 DIAGNOSIS — I129 Hypertensive chronic kidney disease with stage 1 through stage 4 chronic kidney disease, or unspecified chronic kidney disease: Secondary | ICD-10-CM | POA: Diagnosis not present

## 2022-09-08 DIAGNOSIS — D638 Anemia in other chronic diseases classified elsewhere: Secondary | ICD-10-CM | POA: Diagnosis not present

## 2022-09-08 DIAGNOSIS — M533 Sacrococcygeal disorders, not elsewhere classified: Secondary | ICD-10-CM | POA: Diagnosis not present

## 2022-09-08 DIAGNOSIS — Z79624 Long term (current) use of inhibitors of nucleotide synthesis: Secondary | ICD-10-CM | POA: Diagnosis not present

## 2022-09-08 DIAGNOSIS — E876 Hypokalemia: Secondary | ICD-10-CM | POA: Diagnosis not present

## 2022-09-08 DIAGNOSIS — Z85238 Personal history of other malignant neoplasm of thymus: Secondary | ICD-10-CM | POA: Diagnosis not present

## 2022-09-08 DIAGNOSIS — Z7961 Long term (current) use of immunomodulator: Secondary | ICD-10-CM | POA: Diagnosis not present

## 2022-09-08 DIAGNOSIS — G4700 Insomnia, unspecified: Secondary | ICD-10-CM | POA: Diagnosis not present

## 2022-09-08 DIAGNOSIS — G4709 Other insomnia: Secondary | ICD-10-CM | POA: Diagnosis not present

## 2022-09-08 DIAGNOSIS — E7800 Pure hypercholesterolemia, unspecified: Secondary | ICD-10-CM | POA: Diagnosis not present

## 2022-09-08 DIAGNOSIS — M8448XD Pathological fracture, other site, subsequent encounter for fracture with routine healing: Secondary | ICD-10-CM | POA: Diagnosis not present

## 2022-09-08 DIAGNOSIS — L89159 Pressure ulcer of sacral region, unspecified stage: Secondary | ICD-10-CM | POA: Diagnosis not present

## 2022-09-08 DIAGNOSIS — I10 Essential (primary) hypertension: Secondary | ICD-10-CM | POA: Diagnosis not present

## 2022-09-08 DIAGNOSIS — R404 Transient alteration of awareness: Secondary | ICD-10-CM | POA: Diagnosis not present

## 2022-09-08 DIAGNOSIS — N189 Chronic kidney disease, unspecified: Secondary | ICD-10-CM | POA: Diagnosis not present

## 2022-09-08 DIAGNOSIS — R530 Neoplastic (malignant) related fatigue: Secondary | ICD-10-CM | POA: Diagnosis not present

## 2022-09-08 DIAGNOSIS — I350 Nonrheumatic aortic (valve) stenosis: Secondary | ICD-10-CM | POA: Diagnosis not present

## 2022-09-08 DIAGNOSIS — L89154 Pressure ulcer of sacral region, stage 4: Secondary | ICD-10-CM | POA: Diagnosis not present

## 2022-09-08 DIAGNOSIS — R2689 Other abnormalities of gait and mobility: Secondary | ICD-10-CM | POA: Diagnosis not present

## 2022-09-08 DIAGNOSIS — G893 Neoplasm related pain (acute) (chronic): Secondary | ICD-10-CM | POA: Diagnosis not present

## 2022-09-08 DIAGNOSIS — K922 Gastrointestinal hemorrhage, unspecified: Secondary | ICD-10-CM | POA: Diagnosis not present

## 2022-09-08 DIAGNOSIS — N179 Acute kidney failure, unspecified: Secondary | ICD-10-CM | POA: Diagnosis not present

## 2022-09-08 DIAGNOSIS — Z7401 Bed confinement status: Secondary | ICD-10-CM | POA: Diagnosis not present

## 2022-09-08 DIAGNOSIS — R41841 Cognitive communication deficit: Secondary | ICD-10-CM | POA: Diagnosis not present

## 2022-09-08 DIAGNOSIS — R1311 Dysphagia, oral phase: Secondary | ICD-10-CM | POA: Diagnosis not present

## 2022-09-08 DIAGNOSIS — G9340 Encephalopathy, unspecified: Secondary | ICD-10-CM | POA: Diagnosis not present

## 2022-09-08 DIAGNOSIS — I82492 Acute embolism and thrombosis of other specified deep vein of left lower extremity: Secondary | ICD-10-CM | POA: Diagnosis not present

## 2022-09-08 DIAGNOSIS — R531 Weakness: Secondary | ICD-10-CM | POA: Diagnosis not present

## 2022-09-08 DIAGNOSIS — K760 Fatty (change of) liver, not elsewhere classified: Secondary | ICD-10-CM | POA: Diagnosis not present

## 2022-09-08 DIAGNOSIS — C9000 Multiple myeloma not having achieved remission: Secondary | ICD-10-CM | POA: Diagnosis present

## 2022-09-08 DIAGNOSIS — Z743 Need for continuous supervision: Secondary | ICD-10-CM | POA: Diagnosis not present

## 2022-09-08 DIAGNOSIS — B372 Candidiasis of skin and nail: Secondary | ICD-10-CM | POA: Diagnosis not present

## 2022-09-08 DIAGNOSIS — Z79899 Other long term (current) drug therapy: Secondary | ICD-10-CM | POA: Diagnosis not present

## 2022-09-08 DIAGNOSIS — I1310 Hypertensive heart and chronic kidney disease without heart failure, with stage 1 through stage 4 chronic kidney disease, or unspecified chronic kidney disease: Secondary | ICD-10-CM | POA: Diagnosis not present

## 2022-09-08 DIAGNOSIS — R4182 Altered mental status, unspecified: Secondary | ICD-10-CM | POA: Diagnosis not present

## 2022-09-08 DIAGNOSIS — Z5112 Encounter for antineoplastic immunotherapy: Secondary | ICD-10-CM | POA: Diagnosis present

## 2022-09-08 DIAGNOSIS — G894 Chronic pain syndrome: Secondary | ICD-10-CM | POA: Diagnosis not present

## 2022-09-08 DIAGNOSIS — Z7952 Long term (current) use of systemic steroids: Secondary | ICD-10-CM | POA: Diagnosis not present

## 2022-09-08 DIAGNOSIS — E43 Unspecified severe protein-calorie malnutrition: Secondary | ICD-10-CM | POA: Diagnosis not present

## 2022-09-08 DIAGNOSIS — M6281 Muscle weakness (generalized): Secondary | ICD-10-CM | POA: Diagnosis not present

## 2022-09-08 MED ORDER — ACETAMINOPHEN 325 MG PO TABS: 650.0000 mg | ORAL_TABLET | Freq: Three times a day (TID) | ORAL | Status: AC | PRN

## 2022-09-08 MED ORDER — LIDOCAINE 5 % EX PTCH: 1.0000 | MEDICATED_PATCH | CUTANEOUS | 0 refills | Status: DC

## 2022-09-08 MED ORDER — METOCLOPRAMIDE HCL 5 MG/ML IJ SOLN
10.0000 mg | Freq: Once | INTRAMUSCULAR | Status: AC
  Administered 2022-09-08: 10 mg via INTRAVENOUS
  Filled 2022-09-08: qty 2

## 2022-09-08 MED ORDER — ASCORBIC ACID 500 MG PO TABS: 500.0000 mg | ORAL_TABLET | Freq: Every day | ORAL | Status: DC

## 2022-09-08 MED ORDER — MEDIHONEY WOUND/BURN DRESSING EX PSTE: 1.0000 | PASTE | Freq: Every day | CUTANEOUS | 0 refills | Status: AC

## 2022-09-08 MED ORDER — SUCRALFATE 1 GM/10ML PO SUSP: 1.0000 g | Freq: Three times a day (TID) | ORAL | 0 refills | Status: DC

## 2022-09-08 MED ORDER — ACETAMINOPHEN 500 MG PO TABS: 1000.0000 mg | ORAL_TABLET | Freq: Three times a day (TID) | ORAL | 0 refills | Status: DC | PRN

## 2022-09-08 MED ORDER — ZINC SULFATE 220 (50 ZN) MG PO CAPS: 220.0000 mg | ORAL_CAPSULE | Freq: Every day | ORAL | 0 refills | Status: AC

## 2022-09-08 NOTE — Progress Notes (Signed)
Mobility Specialist Progress Note    09/08/22 1345  Mobility  Activity Ambulated with assistance to bathroom;Ambulated with assistance in hallway  Level of Assistance Minimal assist, patient does 75% or more  Assistive Device Front wheel walker  Distance Ambulated (ft) 100 ft  Activity Response Tolerated well  Mobility Referral Yes  $Mobility charge 1 Mobility  Mobility Specialist Start Time (ACUTE ONLY) 1330  Mobility Specialist Stop Time (ACUTE ONLY) 1345  Mobility Specialist Time Calculation (min) (ACUTE ONLY) 15 min   Pre-Mobility: 76 HR Post-Mobility: 70 HR  Pt received in bed requesting to use bathroom. Had void. No complaints. Returned to sitting at sink for personal hygiene tasks. NT aware. Family present.   [place] Mobility Specialist  Please Neurosurgeon or Rehab Office at [phone]

## 2022-09-08 NOTE — TOC Transition Note (Signed)
Transition of Care [facility]) - CM/SW Discharge Note   Patient Details  Name: [name] MRN: [id number] Date of Birth: 10/10/1940  Transition of Care [facility]) CM/SW Contact:  Tully Mcinturff A [place], LCSWA Phone Number: 09/08/2022, 4:28 PM   Clinical Narrative:     Patient will DC to: [facility]   Anticipated DC date: 09/08/22  Family notified: Bjorn Loser, pt's daughter  Transport by: Theodoro Grist ID: [id number] Reference ID; [id number]   Per MD patient ready for DC to [facility] . RN, patient, patient's family, and facility notified of DC. Discharge Summary and FL2 sent to facility. RN to call report prior to discharge ( [phone]). DC packet on chart. Ambulance transport requested for patient.     CSW will sign off for now as social work intervention is no longer needed. Please consult [place] again if new needs arise.   Final next level of care: Skilled Nursing Facility Barriers to Discharge: Barriers Resolved   Patient Goals and CMS Choice CMS Medicare.gov Compare Post Acute Care list provided to:: Patient Choice offered to / list presented to : Patient, Adult Children  Discharge Placement                Patient [facility] bed at: [facility] Patient to be transferred to facility by: [facility] Name of family member notified: [name] Patient and family notified of of transfer: 09/08/22  Discharge Plan and Services Additional resources added to the After Visit Summary for   In-house Referral: Clinical Social Work   Post Acute Care Choice: Skilled Nursing Facility                               Social Determinants of Health (SDOH) Interventions SDOH Screenings   Food Insecurity: No Food Insecurity (08/31/2022)  Housing: Low Risk  (08/31/2022)  Transportation Needs: No Transportation Needs (08/31/2022)  Utilities: Not At Risk (08/31/2022)  Tobacco Use: Low Risk  (09/05/2022)     Readmission Risk Interventions    09/03/2022    9:28 AM  Readmission Risk Prevention  Plan  Transportation Screening Complete  Medication Review (RN Care Manager) Complete  PCP or Specialist appointment within 3-5 days of discharge Complete  HRI or Home Care Consult Complete  SW Recovery Care/Counseling Consult Complete  Palliative Care Screening Not Applicable  Skilled Nursing Facility Complete

## 2022-09-08 NOTE — Plan of Care (Signed)
Problem: Education: Goal: Knowledge of General Education information will improve Description: Including pain rating scale, medication(s)/side effects and non-pharmacologic comfort measures 09/08/2022 0824 by Herma Carson, RN Outcome: Progressing 09/08/2022 0824 by Herma Carson, RN Outcome: Progressing   Problem: Health Behavior/Discharge Planning: Goal: Ability to manage health-related needs will improve 09/08/2022 0824 by Herma Carson, RN Outcome: Progressing 09/08/2022 0824 by Herma Carson, RN Outcome: Progressing   Problem: Clinical Measurements: Goal: Ability to maintain clinical measurements within normal limits will improve 09/08/2022 0824 by Herma Carson, RN Outcome: Progressing 09/08/2022 0824 by Herma Carson, RN Outcome: Progressing Goal: Will remain free from infection 09/08/2022 0824 by Herma Carson, RN Outcome: Progressing 09/08/2022 0824 by Herma Carson, RN Outcome: Progressing Goal: Diagnostic test results will improve 09/08/2022 0824 by Herma Carson, RN Outcome: Progressing 09/08/2022 0824 by Herma Carson, RN Outcome: Progressing Goal: Respiratory complications will improve 09/08/2022 0824 by Herma Carson, RN Outcome: Progressing 09/08/2022 0824 by Herma Carson, RN Outcome: Progressing Goal: Cardiovascular complication will be avoided 09/08/2022 0824 by Herma Carson, RN Outcome: Progressing 09/08/2022 0824 by Herma Carson, RN Outcome: Progressing   Problem: Activity: Goal: Risk for activity intolerance will decrease 09/08/2022 0824 by Herma Carson, RN Outcome: Progressing 09/08/2022 0824 by Herma Carson, RN Outcome: Progressing   Problem: Nutrition: Goal: Adequate nutrition will be maintained 09/08/2022 0824 by Herma Carson, RN Outcome: Progressing 09/08/2022 0824 by Herma Carson, RN Outcome: Progressing   Problem: Coping: Goal: Level of anxiety will decrease 09/08/2022 0824 by Herma Carson, RN Outcome:  Progressing 09/08/2022 0824 by Herma Carson, RN Outcome: Progressing   Problem: Elimination: Goal: Will not experience complications related to bowel motility 09/08/2022 0824 by Herma Carson, RN Outcome: Progressing 09/08/2022 0824 by Herma Carson, RN Outcome: Progressing Goal: Will not experience complications related to urinary retention 09/08/2022 0824 by Herma Carson, RN Outcome: Progressing 09/08/2022 0824 by Herma Carson, RN Outcome: Progressing   Problem: Pain Managment: Goal: General experience of comfort will improve 09/08/2022 0824 by Herma Carson, RN Outcome: Progressing 09/08/2022 0824 by Herma Carson, RN Outcome: Progressing   Problem: Safety: Goal: Ability to remain free from injury will improve 09/08/2022 0824 by Herma Carson, RN Outcome: Progressing 09/08/2022 0824 by Herma Carson, RN Outcome: Progressing   Problem: Skin Integrity: Goal: Risk for impaired skin integrity will decrease 09/08/2022 0824 by Herma Carson, RN Outcome: Progressing 09/08/2022 0824 by Herma Carson, RN Outcome: Progressing   Problem: Education: Goal: Understanding of CV disease, CV risk reduction, and recovery process will improve 09/08/2022 0824 by Herma Carson, RN Outcome: Progressing 09/08/2022 0824 by Herma Carson, RN Outcome: Progressing Goal: Individualized Educational Video(s) 09/08/2022 0824 by Herma Carson, RN Outcome: Progressing 09/08/2022 0824 by Herma Carson, RN Outcome: Progressing   Problem: Activity: Goal: Ability to return to baseline activity level will improve 09/08/2022 0824 by Herma Carson, RN Outcome: Progressing 09/08/2022 0824 by Herma Carson, RN Outcome: Progressing   Problem: Cardiovascular: Goal: Ability to achieve and maintain adequate cardiovascular perfusion will improve 09/08/2022 0824 by Herma Carson, RN Outcome: Progressing 09/08/2022 0824 by Herma Carson, RN Outcome: Progressing Goal: Vascular access site(s) Level  0-1 will be maintained 09/08/2022 0824 by Herma Carson, RN Outcome: Progressing 09/08/2022 0824 by Herma Carson, RN Outcome: Progressing   Problem: Health Behavior/Discharge Planning: Goal: Ability to safely manage health-related needs after  discharge will improve 09/08/2022 0824 by Herma Carson, RN Outcome: Progressing 09/08/2022 0824 by Herma Carson, RN Outcome: Progressing

## 2022-09-08 NOTE — Plan of Care (Signed)
  Problem: SLP Dysphagia Goals Goal: Patient will utilize recommended strategies Description: Patient will utilize recommended strategies during swallow to increase swallowing safety with Outcome: Adequate for Discharge   Problem: Acute Rehab OT Goals (only OT should resolve) Goal: Pt. Will Perform Grooming Outcome: Adequate for Discharge Goal: Pt. Will Perform Upper Body Bathing Outcome: Adequate for Discharge Goal: Pt. Will Perform Lower Body Dressing Outcome: Adequate for Discharge Goal: Pt. Will Transfer To Toilet Outcome: Adequate for Discharge Goal: Pt. Will Perform Toileting-Clothing Manipulation Outcome: Adequate for Discharge Goal: OT Additional ADL Goal #1 Outcome: Adequate for Discharge   Problem: Acute Rehab PT Goals(only PT should resolve) Goal: Pt Will Go Supine/Side To Sit Outcome: Adequate for Discharge Goal: Patient Will Transfer Sit To/From Stand Outcome: Adequate for Discharge Goal: Pt Will Transfer Bed To Chair/Chair To Bed Outcome: Adequate for Discharge Goal: Pt Will Ambulate Outcome: Adequate for Discharge   Problem: Malnutrition  (NI-5.2) Goal: Food and/or nutrient delivery Description: Individualized approach for food/nutrient provision. Outcome: Adequate for Discharge

## 2022-09-08 NOTE — Discharge Summary (Signed)
Physician Discharge Summary  ISMAEL WNEK ZOX:[id number] DOB: May 29, 1940 DOA: 08/30/2022  PCP: [name], MD  Admit date: 08/30/2022 Discharge date: 09/08/2022 Recommendations for Outpatient Follow-up:  Follow up with PCP in 1 weeks-call for appointment Please obtain BMP/CBC in one week  Discharge Dispo: SNF Discharge Condition: Stable Code Status:   Code Status: DNR Diet recommendation:  Diet Order             Diet - low sodium heart healthy           Diet regular Room service appropriate? Yes; Fluid consistency: Thin  Diet effective now                    Brief/Interim Summary: 82 y.o.f w/ multiple myeloma, HTN-who presented with encephalopathy in the setting of narcotic use/AKI.  Chest x-ray with chronic interstitial lung markings CT head no acute finding renal ultrasound no hydronephrosis x-ray pelvis no acute osseous finding. Patient was managed with supportive care with resolution of encephalopathy-improvement in renal function-however further hospital course complicated by left lower extremity DVT placed on anticoagulation but had, worsening anemia and melanotic stools, needing blood transfusion she was seen by GI underwent EGD 5/30 showing erosive esophagitis severe with contact oozing vascular surgery were consulted for IVC placement. 5/31 underwent IVC filter placement by Dr. Lenell Antu who advised follow-up with ENT in 1 to 2 months to schedule IVC filter retrieval. Plan is to hold off on anticoagulation for several weeks in order to let her recover,should follow up w/ oncology to address as OP. PT has recommended skilled nursing facility and awaiting palcement   Discharge Diagnoses:  Principal Problem:   AMS (altered mental status) Active Problems:   Electrolyte abnormality   Pathological fracture of thoracic vertebra   Essential hypertension, benign   Acute renal failure superimposed on stage 3a chronic kidney disease (HCC)   Multiple myeloma (HCC)   Anemia   S/P  aortic valve replacement   Goals of care, counseling/discussion   Acute encephalopathy   Protein-calorie malnutrition, severe  Acute toxic metabolic encephalopathy: Secondary to long-acting morphine in the setting of AKI.  Mental status improved, pain medication discontinued continue 1 to twice a day Percocet as needed, Tylenol.  Continue delirium precaution fall precautions supportive care.  AKI resolved,UA negative for proteinuria-renal ultrasound negative for hydronephrosis creat improved to baseline. Recent Labs    08/30/22 2348 08/31/22 0516 08/31/22 1431 09/01/22 0112 09/02/22 0341 09/03/22 0136 09/03/22 1731 09/04/22 0416 09/05/22 0529 09/07/22 0149  BUN 58* 57* 55* 55* 60* 62* 55* 60* 59* 47*  CREATININE 2.73* 2.37* 2.08* 1.79* 1.45* 1.33* 1.20* 1.18* 1.16* 0.98  CO2 29 22 27 25 25 25 25 27 27  25    Hypokalemia/hypomagnesemia resolved Hyperphosphatemia: Secondary to bone tumor/osteoclastic activity, cont Renvela and fu as outpatient Recent Labs  Lab 09/02/22 0341 09/03/22 0136 09/03/22 1731 09/04/22 0416 09/05/22 0529 09/07/22 0149  K 3.1* 2.7* 4.1 3.6 4.1 3.5  CALCIUM 9.1 8.8* 8.7* 8.6* 8.5* 7.6*  MG 1.9 1.9  --  2.0 1.8  --   PHOS >30.0*  --   --   --   --   --     Normocytic anemia Acute blood loss anemia: Anemia multifactorial with baseline normocytic anemia due to chronic disease/malignancy and blood loss anemia due to GI bleeding.  Hemoglobin is stable monitor and keep above 8, so far received 2 units of blood transfusion Recent Labs  Lab 09/04/22 0416 09/04/22 1724 09/05/22 0529 09/06/22 0446  09/07/22 0149  HGB 7.4* 7.6* 7.5* 8.7* 8.9*  HCT 22.3* 23.8* 22.7* 25.9* 26.6*    Upper GI bleeding:Occurred on 5/27 currently stable resolved, underwent EGD with severe esophageal esophagitis with contact bleeding likely the etiology.  For the continue PPI twice daily x 8 weeks then once a day, continue Carafate 4 weeks Second Look endoscopy in 3 to 4  months  Left Leg DVT: Initially on IV heparin transitioned to Eliquis 5/27>Unfortunately anticoagulation  held since patient developed a GI bleeding. Given severity of anemia-risk for recurrent GI bleeding with anticoagulation-vascular surgery consulted and s/p IVC filter placement done 5/31. Continue to hold anticoagulation at least 2 weeks in order to let her recover continue PT OT to see if she would tolerate chemotherapy, she is to follow-up with Dr. Arbutus Ped to consider resumption.  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  Only needing opiates once a day. Previously discussed with Dr. Shirline Frees on 5/28 by [facility]- advised to continue supportive care- He had no further recommendations and his plan were to initiate chemotherapy on 5/31-but given ongoing medical issues-this has been postponed-plans for patient to go to SNF-and get rehab to see if she can get some functional recovery-with follow-up reevaluation by oncology to see if she would be strong enough to tolerate chemotherapy   History of bioprosthetic aortic valve replacement 2015:Recent echo 7/23-showed stable prosthetic valve.   HTN: BP well-controlled continue amlodipine continue to hold lisinopril HCTZ.No beta-blocker due to history of bradycardia.   Debility/deconditioning: Due to due to illness superimposed on underlying malignancy. Cont ptot and planning for skilled nursing facility tomorrow   Pressure ulcer sacrum unstageable POA wound care as below Pressure Injury 08/31/22 Sacrum Mid Unstageable - Full thickness tissue loss in which the base of the injury is covered by slough (yellow, tan, gray, green or brown) and/or eschar (tan, brown or black) in the wound bed. previously noted deep tissue press (Active)  08/31/22 1300   Location: Sacrum  Location Orientation: Mid  Staging: Unstageable - Full thickness tissue loss in which the base of the injury is covered by slough (yellow, tan, gray, green or brown) and/or eschar (tan, brown or black) in the wound bed.  Wound Description (Comments): previously noted deep tissue pressure injury has evolved to Unstageable  Present on Admission: Yes   Severe malnutrition RD consulted augment diet as below Nutrition Problem: Severe Malnutrition Etiology: chronic illness Signs/Symptoms: percent weight loss, energy intake < 75% for > or equal to 1 month Percent weight loss: 13 % Interventions: MVI, Education, Refer to RD note for recommendations, Nepro shake    Consults: Pmt woc Subjective: Aaox3, nauseous in am, improved with meds,otherwise no new complaints.  Discharge Exam: Vitals:   09/08/22 0421 09/08/22 1412  BP: (!) 131/47 (!) 118/41  Pulse: (!) 55 (!) 54  Resp: 18 16  Temp: 98.1 F (36.7 C)   SpO2: 97%    General: Pt is alert, awake, not in acute distress Cardiovascular: RRR, S1/S2 +, no rubs, no gallops Respiratory: CTA bilaterally, no wheezing, no rhonchi Abdominal: Soft, NT, ND, bowel sounds + Extremities: no edema, no cyanosis  Discharge Instructions  Discharge Instructions     Diet - low sodium heart healthy   Complete by: As directed    Discharge instructions   Complete by: As directed    Please discuss  with Dr Shirline Frees regarding whe nyou can resume anticoagulation in future.  Please call call MD or return to ER for similar or worsening recurring problem that brought you to hospital or if any fever,nausea/vomiting,abdominal pain, uncontrolled pain, chest pain,  shortness of breath or any other alarming symptoms.  Please follow-up your doctor as instructed in a week time and call the office for appointment.  Please avoid alcohol, smoking, or any other illicit substance and maintain healthy habits including taking your regular medications  as prescribed.  You were cared for by a hospitalist during your hospital stay. If you have any questions about your discharge medications or the care you received while you were in the hospital after you are discharged, you can call the unit and ask to speak with the hospitalist on call if the hospitalist that took care of you is not available.  Once you are discharged, your primary care physician will handle any further medical issues. Please note that NO REFILLS for any discharge medications will be authorized once you are discharged, as it is imperative that you return to your primary care physician (or establish a relationship with a primary care physician if you do not have one) for your aftercare needs so that they can reassess your need for medications and monitor your lab values   Discharge wound care:   Complete by: As directed    Apply Medihoney to sacrum wound Q day, then cover with foam dressing.  Change foam dressing Q 3 days or PRN soiling.   Increase activity slowly   Complete by: As directed       Allergies as of 09/08/2022       Reactions   Crestor [rosuvastatin Calcium] Other (See Comments)   Myalgias with 5mg  daily dose   Lactose Other (See Comments)   Lipitor [atorvastatin] Other (See Comments)   Stomach pain   Pravastatin Other (See Comments)   Myalgias   Simvastatin Other (See Comments)   Myalgias   Tramadol Other (See Comments)   FEELS LOOPY   Zetia [ezetimibe]    Myalgias   Codeine Palpitations   Penicillins Palpitations   Sulfa Antibiotics Palpitations   Vancomycin Itching, Other (See Comments)   Pt developed flushing and itchy scalp greater than into the infusion.  RedMan syndrone developed.  Will need slower admin rate for future infusions.        Medication List     STOP taking these medications    aspirin EC 81 MG tablet   hydrochlorothiazide 12.5 MG capsule Commonly known as: MICROZIDE   lisinopril 10 MG tablet Commonly known as:  ZESTRIL   morphine 15 MG 12 hr tablet Commonly known as: MS CONTIN       TAKE these medications    acetaminophen 325 MG tablet Commonly known as: TYLENOL Take 2 tablets (650 mg total) by mouth every 8 (eight) hours as needed.   allopurinol 100 MG tablet Commonly known as: ZYLOPRIM Take 1 tablet (100 mg total) by mouth 2 (two) times daily.   amLODipine 10 MG tablet Commonly known as: NORVASC Take 1 tablet (10 mg total) by mouth daily.   ascorbic acid 500 MG tablet Commonly known as: VITAMIN C Take 1 tablet (500 mg total) by mouth daily. Start taking on: September 09, 2022   dexamethasone 2 MG tablet Commonly known as: DECADRON Take 1 tablet (2 mg total) by mouth daily. What changed:  medication strength how much to take how to take this when to take  this additional instructions   feeding supplement (NEPRO CARB STEADY) Liqd Take 237 mLs by mouth 2 (two) times daily between meals.   HYDROcodone-acetaminophen 5-325 MG tablet Commonly known as: NORCO/VICODIN Take 1 tablet by mouth daily as needed for up to 3 days for severe pain. What changed: when to take this   lenalidomide 15 MG capsule Commonly known as: REVLIMID Take 1 capsule (15 mg total) by mouth daily. Take for 21 days, then hold for 7 days. Repeat every 28 days.   leptospermum manuka honey Pste paste Apply 1 Application topically daily for 14 days. Start taking on: September 09, 2022   lidocaine 5 % Commonly known as: LIDODERM Place 1 patch onto the skin daily. Remove & Discard patch within 12 hours or as directed by MD   multivitamin Tabs tablet Take 1 tablet by mouth at bedtime.   multivitamin with minerals Tabs tablet Take 1 tablet by mouth daily.   naloxone 4 MG/0.1ML Liqd nasal spray kit Commonly known as: NARCAN SMARTSIG:1 Both Nares Daily   pantoprazole 40 MG tablet Commonly known as: Protonix Take 1 tablet (40 mg total) by mouth 2 (two) times daily.   prochlorperazine 10 MG tablet Commonly  known as: COMPAZINE Take 1 tablet (10 mg total) by mouth every 6 (six) hours as needed for nausea or vomiting.   sucralfate 1 GM/10ML suspension Commonly known as: CARAFATE Take 10 mLs (1 g total) by mouth 4 (four) times daily -  with meals and at bedtime.   zinc sulfate 220 (50 Zn) MG capsule Take 1 capsule (220 mg total) by mouth daily for 14 days. Start taking on: September 09, 2022               Discharge Care Instructions  (From admission, onward)           Start     Ordered   09/07/22 0000  Discharge wound care:       Comments: Apply Medihoney to sacrum wound Q day, then cover with foam dressing.  Change foam dressing Q 3 days or PRN soiling.   09/07/22 1007            Follow-up Information     [name], MD Follow up in 1 week(s).   Specialty: Internal Medicine Contact information: [address] [place] [address] [phone]                Allergies  Allergen Reactions   Crestor [Rosuvastatin Calcium] Other (See Comments)    Myalgias with 5mg  daily dose   Lactose Other (See Comments)   Lipitor [Atorvastatin] Other (See Comments)    Stomach pain   Pravastatin Other (See Comments)    Myalgias    Simvastatin Other (See Comments)    Myalgias    Tramadol Other (See Comments)    FEELS LOOPY   Zetia [Ezetimibe]     Myalgias   Codeine Palpitations   Penicillins Palpitations   Sulfa Antibiotics Palpitations   Vancomycin Itching and Other (See Comments)    Pt developed flushing and itchy scalp greater than into the infusion.  RedMan syndrone developed.  Will need slower admin rate for future infusions.   The results of significant diagnostics from this hospitalization (including imaging, microbiology, ancillary and laboratory) are listed below for reference.    Microbiology: No results found for this or any previous visit (from the past 240 hour(s)).  Procedures/Studies: PERIPHERAL VASCULAR CATHETERIZATION  Result  Date: 09/06/2022 DATE OF  SERVICE: 09/06/2022  PATIENT:  Shaaron Adler  82 y.o. female  PRE-OPERATIVE DIAGNOSIS:  DVT, relative contraindication to anticoagulation  POST-OPERATIVE DIAGNOSIS:  Same  PROCEDURE:  Ultrasound guided right common femoral vein placement IVC filter placement Conscious sedation (10 minutes)  SURGEON:  Surgeon(s) and Role:    * Leonie Douglas, MD - Primary  ASSISTANT: none  ANESTHESIA:   local and IV sedation  EBL: minimal  BLOOD ADMINISTERED:none  DRAINS: none  LOCAL MEDICATIONS USED:  LIDOCAINE  SPECIMEN:  none  COUNTS: confirmed correct.  TOURNIQUET:  none  PATIENT DISPOSITION:  PACU - hemodynamically stable.  Delay start of Pharmacological VTE agent (>24hrs) due to surgical blood loss or risk of bleeding: no  INDICATION FOR PROCEDURE: RAYAH KANODE is a 82 y.o. female with DVT, relative contraindication to anticoagulation. After careful discussion of risks, benefits, and alternatives the patient was offered IVC filter placement. The patient understood and wished to proceed.  OPERATIVE FINDINGS: successful IVC filter placement  DESCRIPTION OF PROCEDURE: After identification of the patient in the pre-operative holding area, the patient was transferred to the operating room. The patient was positioned supine on the operating room table. Anesthesia was induced. The groins were prepped and draped in standard fashion. A surgical pause was performed confirming correct patient, procedure, and operative location.  Ultrasound guidance was used to obtain micropuncture access in the right common femoral vein.  Through the micro sheath, Bentson wire was navigated into the IVC.  The tract was dilated.  A Cook select IVC filter system was prepared per [employer] instructions and brought onto the table.  The sheath and flush catheter were then advanced into the infrarenal IVC.  (Venogram was performed.  We marked the renal veins.  An IVC filter was deployed under the renal veins.  This was  uncomplicated.  A follow-up venogram was performed showing satisfactory IVC filter placement.  All endovascular equipment was removed.  Manual pressure was held on the vena puncture access site with good result.  Upon completion of the case instrument and sharps counts were confirmed correct. The patient was transferred to the PACU in good condition. I was present for all portions of the procedure.  FOLLOW UP PLAN: I will see the patient again in 1 to 2 months to schedule IVC filter retrieval.  Please call for questions in the interim.  Rande Brunt. Lenell Antu, MD FACS [facility] Phone Number: [phone] 09/06/2022 2:27 PM    VAS [place] LOWER EXTREMITY VENOUS (DVT)  Result Date: 08/31/2022  Lower Venous DVT Study Patient Name:  [facility]  Date of Exam:   08/31/2022 Medical Rec #: [id number]     Accession #:    [id number] Date of Birth: [birth year]     Patient Gender: F Patient Age:   27 years Exam Location:  [facility] Procedure:      VAS [place] LOWER EXTREMITY VENOUS (DVT) Referring Phys: Alwyn Ren GONFA --------------------------------------------------------------------------------  Indications: Bilateral leg swelling.  Risk Factors: Cancer - multiple myeloma. Comparison Study: No prior studies. Performing Technologist: Jean Rosenthal RDMS, RVT  Examination Guidelines: A complete evaluation includes B-mode imaging, spectral Doppler, color Doppler, and power Doppler as needed of all accessible portions of each vessel. Bilateral testing is considered an integral part of a complete examination. Limited examinations for reoccurring indications may be performed as noted. The reflux portion of the exam is performed with the patient in reverse Trendelenburg.  +---------+---------------+---------+-----------+----------+--------------+ RIGHT    CompressibilityPhasicitySpontaneityPropertiesThrombus Aging +---------+---------------+---------+-----------+----------+--------------+  CFV       Full           Yes      Yes                                 +---------+---------------+---------+-----------+----------+--------------+ SFJ      Full                                                        +---------+---------------+---------+-----------+----------+--------------+ FV Prox  Full                                                        +---------+---------------+---------+-----------+----------+--------------+ FV Mid   Full                                                        +---------+---------------+---------+-----------+----------+--------------+ FV DistalFull                                                        +---------+---------------+---------+-----------+----------+--------------+ PFV      Full                                                        +---------+---------------+---------+-----------+----------+--------------+ POP      Full           Yes      Yes                                 +---------+---------------+---------+-----------+----------+--------------+ PTV      Full                                                        +---------+---------------+---------+-----------+----------+--------------+ PERO     Full                                                        +---------+---------------+---------+-----------+----------+--------------+   +---------+---------------+---------+-----------+----------+--------------+ LEFT     CompressibilityPhasicitySpontaneityPropertiesThrombus Aging +---------+---------------+---------+-----------+----------+--------------+ CFV      Full           Yes      Yes                                 +---------+---------------+---------+-----------+----------+--------------+  SFJ      Full                                                        +---------+---------------+---------+-----------+----------+--------------+ FV Prox  Full                                                         +---------+---------------+---------+-----------+----------+--------------+ FV Mid   Full                                                        +---------+---------------+---------+-----------+----------+--------------+ FV DistalFull                                                        +---------+---------------+---------+-----------+----------+--------------+ PFV      Full                                                        +---------+---------------+---------+-----------+----------+--------------+ POP      Full                                                        +---------+---------------+---------+-----------+----------+--------------+ PTV      Partial        Yes      Yes                  Acute          +---------+---------------+---------+-----------+----------+--------------+ PERO     None           No       No                   Acute          +---------+---------------+---------+-----------+----------+--------------+     Summary: RIGHT: - There is no evidence of deep vein thrombosis in the lower extremity.  - No cystic structure found in the popliteal fossa.  LEFT: - Findings consistent with acute deep vein thrombosis involving the left posterior tibial veins, and left peroneal veins. - A cystic structure is found in the popliteal fossa.  *See table(s) above for measurements and observations. Electronically signed by Waverly Ferrari MD on 08/31/2022 at 7:59:15 PM.    Final    DG Pelvis 1-2 Views  Result Date: 08/31/2022 CLINICAL DATA:  Sacral wound.  History of multiple myeloma. EXAM: PELVIS - 1-2 VIEW COMPARISON:  PET-CT 08/22/2022 FINDINGS: No acute fracture or pelvic diastasis is identified. The lower sacrum and coccyx are poorly evaluated  due to overlying stool. Degenerative changes are noted in the lower lumbar spine, and there are mild degenerative changes at the hips. Small lucencies consistent with known multiple myeloma  are most conspicuous in the pubic rami. IMPRESSION: 1. No acute osseous abnormality identified. 2. Known multiple myeloma. Electronically Signed   By: Sebastian Ache M.D.   On: 08/31/2022 11:26   US RENAL  Result Date: 08/31/2022 CLINICAL DATA:  Acute kidney injury EXAM: RENAL / URINARY TRACT ULTRASOUND COMPLETE COMPARISON:  07/30/2022 FINDINGS: Right Kidney: Renal measurements: 10.5 x 4.7 x 5 cm = volume: 130 mL. Echogenicity within normal limits. No mass or hydronephrosis visualized. Left Kidney: Renal measurements: 10.2 x 6 x 5.2 cm = volume: 170 mL. Echogenicity within normal limits. No mass or hydronephrosis visualized. Bladder: Appears normal for degree of bladder distention. Other: Echogenic liver consistent with steatosis. 3.5 cm pelvic cyst which is left-sided, ovarian, and underestimated when compared to recent PET CT at which time recommendations were provided for follow-up. IMPRESSION: Normal ultrasound of the kidneys and bladder. Electronically Signed   By: Tiburcio Pea M.D.   On: 08/31/2022 06:33   CT Head Wo Contrast  Result Date: 08/31/2022 CLINICAL DATA:  Altered mental status. EXAM: CT HEAD WITHOUT CONTRAST TECHNIQUE: Contiguous axial images were obtained from the base of the skull through the vertex without intravenous contrast. RADIATION DOSE REDUCTION: This exam was performed according to the departmental dose-optimization program which includes automated exposure control, adjustment of the mA and/or kV according to patient size and/or use of iterative reconstruction technique. COMPARISON:  May 15, 2020 FINDINGS: Brain: There is mild cerebral atrophy with widening of the extra-axial spaces and ventricular dilatation. There are areas of decreased attenuation within the white matter tracts of the supratentorial brain, consistent with microvascular disease changes. Vascular: No hyperdense vessel or unexpected calcification. Skull: Innumerable small lytic lesions are seen scattered  throughout the skull. This represents a new finding when compared to the prior study. Sinuses/Orbits: No acute finding. Other: None. IMPRESSION: 1. No acute intracranial abnormality. 2. 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 3. Generalized cerebral atrophy with widening of the extra-axial spaces and ventricular dilatation. 4. 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 Electronically Signed   By: Aram Candela M.D.   On: 08/31/2022 01:22   DG Chest 2 View  Result Date: 08/31/2022 CLINICAL DATA:  Chest pain. EXAM: CHEST - 2 VIEW COMPARISON:  July 30, 2022 FINDINGS: Multiple sternal wires are noted. The cardiac silhouette is borderline in size and stable in appearance. An artificial aortic valve is seen. Mild to moderate severity diffuse, chronic appearing increased interstitial lung markings are present. Mild atelectasis is seen within the left lung base. There is no evidence of a pleural effusion or pneumothorax. Multilevel degenerative changes are seen throughout the thoracic spine. IMPRESSION: 1. 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 2. Chronic appearing increased interstitial lung markings with mild left basilar atelectasis. Electronically Signed   By: Aram Candela M.D.   On: 08/31/2022 00:23   NM PET Image Initial (PI) Whole Body  Result Date: 08/26/2022 CLINICAL DATA:  Subsequent treatment strategy for multiple myeloma. EXAM: NUCLEAR MEDICINE PET WHOLE BODY TECHNIQUE: 7.6 mCi F-18 FDG was injected intravenously. Full-ring PET imaging was performed from the head to foot after the radiotracer. CT data was obtained and used for attenuation correction and anatomic localization. Fasting blood glucose: 86 mg/dl COMPARISON:  CT chest abdomen pelvis 07/30/2022  and PET 11/28/2021. FINDINGS: Mediastinal blood pool activity: SUV max 2.5 HEAD/NECK: No abnormal  hypermetabolism. Incidental CT findings: None. CHEST: Focal hypermetabolism within the left ventricular cavity, SUV max 11.5. Correlation with CT is challenging without IV contrast. No additional abnormal hypermetabolism. Incidental CT findings: Atherosclerotic calcification of the aorta and coronary arteries. Aortic valve repair. Heart is enlarged. No pericardial or pleural effusion. ABDOMEN/PELVIS: Focal hypermetabolism in the medial limb left adrenal gland, SUV max 3.9, corresponds to an 11 mm low-density nodule (4/120). CT appearance is unchanged from 11/28/2021. 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 No additional abnormal hypermetabolism. Incidental CT findings: Liver is decreased in attenuation diffusely. Cholecystectomy. Adrenal glands, kidneys, spleen, pancreas, stomach and bowel are otherwise grossly unremarkable. 5.3 cm simple appearing left adnexal cyst, as on 11/28/2021. SKELETON: Intensely hypermetabolic lesions are riddled throughout the axial and appendicular skeleton. Index lesion in L5, SUV max 14.1. Incidental CT findings: Degenerative changes in the spine.  Old rib fractures. EXTREMITIES: No additional abnormal hypermetabolism in the soft tissues. Incidental CT findings: None. IMPRESSION: 1. Widespread osseous metastatic disease, compatible with multiple myeloma. 2. Focal hypermetabolism within the left ventricular cavity may correspond to a papillary muscle. Further evaluation is limited without IV contrast. 3. Hepatic steatosis. 4. 5.3 cm simple appearing left adnexal cyst, unchanged from 11/28/2021. Recommend follow-up US in 6-12 months. Note: This recommendation does not apply to premenarchal patients and to those with increased risk (genetic, family history, elevated tumor markers or other high-risk factors) of ovarian cancer. Reference: JACR 2020 Feb; 17(2):248-254 5. Aortic atherosclerosis (ICD10-I70.0). Coronary artery calcification. Electronically Signed   By: Leanna Battles M.D.   On:  08/26/2022 12:14   CT BONE MARROW BIOPSY & ASPIRATION  Result Date: 08/20/2022 INDICATION: Multiple myeloma EXAM: CT GUIDED BONE MARROW ASPIRATES AND BIOPSY MEDICATIONS: None. ANESTHESIA/SEDATION: Versed 0.5 mg IV Fentanyl 75 mcg IV Moderate Sedation Time:  10 minutes The patient was continuously monitored during the procedure by the interventional radiology nurse under my direct supervision. TECHNIQUE: Multidetector CT imaging of the pelvis was performed following the standard protocol without IV contrast. RADIATION DOSE REDUCTION: This exam was performed according to the departmental dose-optimization program which includes automated exposure control, adjustment of the mA and/or kV according to patient size and/or use of iterative reconstruction technique. COMPLICATIONS: None immediate. PROCEDURE: The procedure was explained to the patient. The risks and benefits of the procedure were discussed and the patient's questions were addressed. Informed consent was obtained from the patient. The patient was placed prone on CT table. Images of the pelvis were obtained. The right side of back was prepped and draped in sterile fashion. The skin and right posterior ilium were anesthetized with 1% lidocaine. 11 gauge bone needle was directed into the right ilium with CT guidance. Two aspirates and 1 core biopsy were obtained. Bandage placed over the puncture site. IMPRESSION: CT guided bone marrow aspiration and core biopsy. Electronically Signed   By: Acquanetta Belling M.D.   On: 08/20/2022 13:36    Labs: BNP (last 3 results) Recent Labs    07/30/22 1706  BNP 210.4*   Basic Metabolic Panel: Recent Labs  Lab 09/02/22 0341 09/03/22 0136 09/03/22 1731 09/04/22 0416 09/05/22 0529 09/07/22 0149  NA 135 134* 138 138 142 140  K 3.1* 2.7* 4.1 3.6 4.1 3.5  CL 105 107 108 107 112* 111  CO2 25 25 25 27 27 25   GLUCOSE 126* 119* 131* 114* 114* 130*  BUN 60* 62* 55* 60* 59* 47*  CREATININE 1.45*  1.33* 1.20* 1.18*  1.16* 0.98  CALCIUM 9.1 8.8* 8.7* 8.6* 8.5* 7.6*  MG 1.9 1.9  --  2.0 1.8  --   PHOS >30.0*  --   --   --   --   --    Liver Function Tests: No results for input(s): "AST", "ALT", "ALKPHOS", "BILITOT", "PROT", "ALBUMIN" in the last 168 hours. No results for input(s): "LIPASE", "AMYLASE" in the last 168 hours. No results for input(s): "AMMONIA" in the last 168 hours. CBC: Recent Labs  Lab 09/04/22 0416 09/04/22 1724 09/05/22 0529 09/06/22 0446 09/07/22 0149  WBC 13.2* 15.9* 15.7* 11.7* 14.8*  HGB 7.4* 7.6* 7.5* 8.7* 8.9*  HCT 22.3* 23.8* 22.7* 25.9* 26.6*  MCV 91.8 93.0 91.2 92.5 89.0  PLT 228 263 228 185 226   Recent Labs  Lab 09/07/22 0640  GLUCAP 171*   Recent Labs    09/07/22 0149  CHOL 85  HDL <10*  LDLCALC NOT CALCULATED  TRIG 83  CHOLHDL NOT CALCULATED   Thyroid function studies No results for input(s): "TSH", "T4TOTAL", "T3FREE", "THYROIDAB" in the last 72 hours.  Invalid input(s): "FREET3" Anemia work up No results for input(s): "VITAMINB12", "FOLATE", "FERRITIN", "TIBC", "IRON", "RETICCTPCT" in the last 72 hours. Urinalysis    Component Value Date/Time   COLORURINE YELLOW 08/31/2022 0045   APPEARANCEUR HAZY (A) 08/31/2022 0045   LABSPEC 1.013 08/31/2022 0045   PHURINE 5.0 08/31/2022 0045   GLUCOSEU NEGATIVE 08/31/2022 0045   HGBUR MODERATE (A) 08/31/2022 0045   BILIRUBINUR NEGATIVE 08/31/2022 0045   KETONESUR 5 (A) 08/31/2022 0045   PROTEINUR NEGATIVE 08/31/2022 0045   UROBILINOGEN 0.2 10/12/2013 1117   NITRITE NEGATIVE 08/31/2022 0045   LEUKOCYTESUR NEGATIVE 08/31/2022 0045   Sepsis Labs Recent Labs  Lab 09/04/22 1724 09/05/22 0529 09/06/22 0446 09/07/22 0149  WBC 15.9* 15.7* 11.7* 14.8*   Microbiology No results found for this or any previous visit (from the past 240 hour(s)).   Time coordinating discharge: 25 minutes  SIGNED: Lanae Boast, MD  [facility] 09/08/2022, 2:39 PM  If 7PM-7AM, please contact  night-coverage [email or web address]

## 2022-09-09 ENCOUNTER — Telehealth: Payer: Self-pay | Admitting: Vascular Surgery

## 2022-09-09 ENCOUNTER — Telehealth: Payer: Self-pay | Admitting: Medical Oncology

## 2022-09-09 ENCOUNTER — Encounter (HOSPITAL_COMMUNITY): Payer: Self-pay | Admitting: Vascular Surgery

## 2022-09-09 NOTE — Telephone Encounter (Signed)
Appts wed for lab, Mohamed and infusion. [facility] can bring her. She was up walking today with assist. 1.Should she keep these appts?  2. Should she go ahead and start revlimid?

## 2022-09-09 NOTE — Telephone Encounter (Signed)
-----   Message from Leonie Douglas, MD sent at 09/06/2022  2:34 PM EDT ----- [name] 09/06/2022 Procedure: Ultrasound guided right common femoral vein placement IVC filter placement Conscious sedation (10 minutes)  Assistant: none Follow up: 1-2 months with me Studies for follow up: none  Thank you! Elijah Birk

## 2022-09-09 NOTE — Telephone Encounter (Signed)
Son requested to please fax  scan showing rib fx. Pet and CT reports faxed to Marian Sorrow, NP @ [facility].

## 2022-09-10 ENCOUNTER — Telehealth: Payer: Self-pay | Admitting: Medical Oncology

## 2022-09-10 ENCOUNTER — Encounter: Payer: Self-pay | Admitting: Internal Medicine

## 2022-09-10 DIAGNOSIS — M109 Gout, unspecified: Secondary | ICD-10-CM | POA: Diagnosis not present

## 2022-09-10 DIAGNOSIS — K922 Gastrointestinal hemorrhage, unspecified: Secondary | ICD-10-CM | POA: Diagnosis not present

## 2022-09-10 DIAGNOSIS — C9000 Multiple myeloma not having achieved remission: Secondary | ICD-10-CM | POA: Diagnosis not present

## 2022-09-10 DIAGNOSIS — I82492 Acute embolism and thrombosis of other specified deep vein of left lower extremity: Secondary | ICD-10-CM | POA: Diagnosis not present

## 2022-09-10 DIAGNOSIS — I129 Hypertensive chronic kidney disease with stage 1 through stage 4 chronic kidney disease, or unspecified chronic kidney disease: Secondary | ICD-10-CM | POA: Diagnosis not present

## 2022-09-10 DIAGNOSIS — G894 Chronic pain syndrome: Secondary | ICD-10-CM | POA: Diagnosis not present

## 2022-09-10 DIAGNOSIS — E43 Unspecified severe protein-calorie malnutrition: Secondary | ICD-10-CM | POA: Diagnosis not present

## 2022-09-10 DIAGNOSIS — G9340 Encephalopathy, unspecified: Secondary | ICD-10-CM | POA: Diagnosis not present

## 2022-09-10 DIAGNOSIS — D638 Anemia in other chronic diseases classified elsewhere: Secondary | ICD-10-CM | POA: Diagnosis not present

## 2022-09-10 NOTE — Telephone Encounter (Signed)
Need order to hold revlimid.

## 2022-09-10 NOTE — Progress Notes (Signed)
Called pt to introduce myself as her Dance movement psychotherapist and to discuss the [employer]. Pt was unavailable so I spoke to her son [name] and went over what it covers but he declined it at this time so I will request the registration staff give her my card in case they change their mind and for any questions or concerns they may have in the future.

## 2022-09-10 NOTE — Telephone Encounter (Signed)
Hold Revlimid note faxed to [facility].

## 2022-09-11 ENCOUNTER — Other Ambulatory Visit: Payer: Self-pay

## 2022-09-11 ENCOUNTER — Other Ambulatory Visit: Payer: Self-pay | Admitting: Medical Oncology

## 2022-09-11 ENCOUNTER — Inpatient Hospital Stay: Payer: Medicare PPO

## 2022-09-11 ENCOUNTER — Inpatient Hospital Stay: Payer: Medicare PPO | Attending: Internal Medicine | Admitting: Internal Medicine

## 2022-09-11 ENCOUNTER — Encounter: Payer: Self-pay | Admitting: Medical Oncology

## 2022-09-11 VITALS — BP 147/49 | HR 50 | Temp 97.9°F | Resp 17

## 2022-09-11 DIAGNOSIS — K760 Fatty (change of) liver, not elsewhere classified: Secondary | ICD-10-CM | POA: Diagnosis not present

## 2022-09-11 DIAGNOSIS — L8990 Pressure ulcer of unspecified site, unspecified stage: Secondary | ICD-10-CM | POA: Diagnosis not present

## 2022-09-11 DIAGNOSIS — I350 Nonrheumatic aortic (valve) stenosis: Secondary | ICD-10-CM | POA: Diagnosis not present

## 2022-09-11 DIAGNOSIS — Z79899 Other long term (current) drug therapy: Secondary | ICD-10-CM | POA: Diagnosis not present

## 2022-09-11 DIAGNOSIS — Z7961 Long term (current) use of immunomodulator: Secondary | ICD-10-CM | POA: Diagnosis not present

## 2022-09-11 DIAGNOSIS — Z7952 Long term (current) use of systemic steroids: Secondary | ICD-10-CM | POA: Insufficient documentation

## 2022-09-11 DIAGNOSIS — Z5112 Encounter for antineoplastic immunotherapy: Secondary | ICD-10-CM | POA: Insufficient documentation

## 2022-09-11 DIAGNOSIS — E7800 Pure hypercholesterolemia, unspecified: Secondary | ICD-10-CM | POA: Diagnosis not present

## 2022-09-11 DIAGNOSIS — I1310 Hypertensive heart and chronic kidney disease without heart failure, with stage 1 through stage 4 chronic kidney disease, or unspecified chronic kidney disease: Secondary | ICD-10-CM | POA: Diagnosis not present

## 2022-09-11 DIAGNOSIS — N189 Chronic kidney disease, unspecified: Secondary | ICD-10-CM | POA: Diagnosis not present

## 2022-09-11 DIAGNOSIS — G893 Neoplasm related pain (acute) (chronic): Secondary | ICD-10-CM | POA: Diagnosis not present

## 2022-09-11 DIAGNOSIS — C9000 Multiple myeloma not having achieved remission: Secondary | ICD-10-CM

## 2022-09-11 DIAGNOSIS — G4700 Insomnia, unspecified: Secondary | ICD-10-CM | POA: Diagnosis not present

## 2022-09-11 DIAGNOSIS — Z79624 Long term (current) use of inhibitors of nucleotide synthesis: Secondary | ICD-10-CM | POA: Insufficient documentation

## 2022-09-11 DIAGNOSIS — Z85238 Personal history of other malignant neoplasm of thymus: Secondary | ICD-10-CM | POA: Diagnosis not present

## 2022-09-11 DIAGNOSIS — D649 Anemia, unspecified: Secondary | ICD-10-CM | POA: Insufficient documentation

## 2022-09-11 LAB — CBC WITH DIFFERENTIAL (CANCER CENTER ONLY)
Abs Immature Granulocytes: 0.17 10*3/uL — ABNORMAL HIGH (ref 0.00–0.07)
Basophils Absolute: 0 10*3/uL (ref 0.0–0.1)
Basophils Relative: 0 %
Eosinophils Absolute: 0 10*3/uL (ref 0.0–0.5)
Eosinophils Relative: 0 %
HCT: 27.9 % — ABNORMAL LOW (ref 36.0–46.0)
Hemoglobin: 9.4 g/dL — ABNORMAL LOW (ref 12.0–15.0)
Immature Granulocytes: 1 %
Lymphocytes Relative: 10 %
Lymphs Abs: 1.5 10*3/uL (ref 0.7–4.0)
MCH: 30.1 pg (ref 26.0–34.0)
MCHC: 33.7 g/dL (ref 30.0–36.0)
MCV: 89.4 fL (ref 80.0–100.0)
Monocytes Absolute: 0.9 10*3/uL (ref 0.1–1.0)
Monocytes Relative: 5 %
Neutro Abs: 13.2 10*3/uL — ABNORMAL HIGH (ref 1.7–7.7)
Neutrophils Relative %: 84 %
Platelet Count: 243 10*3/uL (ref 150–400)
RBC: 3.12 MIL/uL — ABNORMAL LOW (ref 3.87–5.11)
RDW: 18.2 % — ABNORMAL HIGH (ref 11.5–15.5)
WBC Count: 15.7 10*3/uL — ABNORMAL HIGH (ref 4.0–10.5)
nRBC: 0 % (ref 0.0–0.2)

## 2022-09-11 LAB — CMP (CANCER CENTER ONLY)
ALT: 97 U/L — ABNORMAL HIGH (ref 0–44)
AST: 91 U/L — ABNORMAL HIGH (ref 15–41)
Albumin: 1.9 g/dL — ABNORMAL LOW (ref 3.5–5.0)
Alkaline Phosphatase: 270 U/L — ABNORMAL HIGH (ref 38–126)
Anion gap: 2 — ABNORMAL LOW (ref 5–15)
BUN: 37 mg/dL — ABNORMAL HIGH (ref 8–23)
CO2: 28 mmol/L (ref 22–32)
Calcium: 7.4 mg/dL — ABNORMAL LOW (ref 8.9–10.3)
Chloride: 104 mmol/L (ref 98–111)
Creatinine: 0.75 mg/dL (ref 0.44–1.00)
GFR, Estimated: >60 mL/min (ref >60)
Glucose, Bld: 122 mg/dL — ABNORMAL HIGH (ref 70–99)
Potassium: 4.1 mmol/L (ref 3.5–5.1)
Sodium: 134 mmol/L — ABNORMAL LOW (ref 135–145)
Total Bilirubin: 0.7 mg/dL (ref 0.3–1.2)
Total Protein: 6.9 g/dL (ref 6.5–8.1)

## 2022-09-11 LAB — HEPATITIS PANEL, ACUTE
HCV Ab: NONREACTIVE
Hep A IgM: NONREACTIVE
Hep B C IgM: NONREACTIVE
Hepatitis B Surface Ag: NONREACTIVE

## 2022-09-11 LAB — TYPE AND SCREEN
ABO/RH(D): O POS
Antibody Screen: NEGATIVE

## 2022-09-11 MED ORDER — DARATUMUMAB-HYALURONIDASE-FIHJ 1800-30000 MG-UT/15ML ~~LOC~~ SOLN
1800.0000 mg | Freq: Once | SUBCUTANEOUS | Status: AC
  Administered 2022-09-11: 1800 mg via SUBCUTANEOUS
  Filled 2022-09-11: qty 15

## 2022-09-11 MED ORDER — DEXAMETHASONE 4 MG PO TABS
20.0000 mg | ORAL_TABLET | Freq: Once | ORAL | Status: AC
  Administered 2022-09-11: 20 mg via ORAL
  Filled 2022-09-11: qty 5

## 2022-09-11 MED ORDER — ACETAMINOPHEN 325 MG PO TABS
650.0000 mg | ORAL_TABLET | Freq: Once | ORAL | Status: AC
  Administered 2022-09-11: 650 mg via ORAL
  Filled 2022-09-11: qty 2

## 2022-09-11 MED ORDER — DIPHENHYDRAMINE HCL 25 MG PO CAPS
50.0000 mg | ORAL_CAPSULE | Freq: Once | ORAL | Status: AC
  Administered 2022-09-11: 50 mg via ORAL
  Filled 2022-09-11: qty 2

## 2022-09-11 MED ORDER — MONTELUKAST SODIUM 10 MG PO TABS
10.0000 mg | ORAL_TABLET | Freq: Once | ORAL | Status: AC
  Administered 2022-09-11: 10 mg via ORAL
  Filled 2022-09-11: qty 1

## 2022-09-11 NOTE — Progress Notes (Signed)
Patient seen by Si Gaul  Vitals are within treatment parameters.  Labs reviewed: and are within treatment parameters.Per Dr. Arbutus Ped  it is ok to proceed with Daratumumab treatment today using creatinine from 09/07/2022 and AST =91 and ALT=97 from today  Per physician team, patient is ready for treatment and there are NO modifications to the treatment plan.

## 2022-09-11 NOTE — Progress Notes (Signed)
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 Dorris Fetch on January 28, 2022.    CURRENT THERAPY:  1) Starting systemic therapy with Revlimid 21 days on 7 days off, Darzalex, and 20 mg of Decadron weekly.  First dose expected next week on 09/11/2022. 2) Monthly Zometa injections for 6 to 12 months, followed by every 3 months thereon after once dental clearance is obtained.  INTERVAL HISTORY: [name] 82 y.o. female returns to the clinic today on a stretcher from the nursing home.  The patient has been complaining of increasing fatigue and weakness and confusion at times.  She was admitted to the hospital recently with altered mental status and worsening symptoms secondary to her multiple myeloma.  She also had acute renal failure during her hospitalization but this is improved.  She is currently at the [place] skilled nursing facility.  She is here for evaluation before starting her treatment for the multiple myeloma.  The patient denied having any current fever or chills.  She has no nausea, vomiting, diarrhea or constipation.  She has no headache or visual changes.  She is currently on Decadron 2 mg p.o. daily and feeling a little bit better.   MEDICAL HISTORY: Past Medical History:  Diagnosis Date   Abnormal vaginal Pap smear 1994   annual paps for years after that.more recently every other year,last in 2012-we agreed not to do them anymore   Anxiety    no rx   Aortic stenosis    s/p AVR with bioprosthesis   Ascending  aorta dilatation (HCC)    40mm by echo 10/2021   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/2015   Carotid artery stenosis    < 50% stenosis bilaterally by doppler 07/2016   Coronary artery disease 2008   Coronary Ca score of 331 with minimal multivessel plaque < 25% stenosis by coronary CTA 8/23   Heart murmur    per pt   Hypercholesteremia    Hypertension    Obesity    Osteopenia    declines treatment   Pneumonia 1995   Pulmonary HTN (HCC)    mild to moderate by echo 7/23 with PASP   Shoulder pain    Due to arthritis   Vitamin D insufficiency     ALLERGIES:  is allergic to crestor [rosuvastatin calcium], lactose, lipitor [atorvastatin], pravastatin, simvastatin, tramadol, zetia [ezetimibe], codeine, penicillins, sulfa antibiotics, and vancomycin.  MEDICATIONS:  Current Outpatient Medications  Medication Sig Dispense Refill   acetaminophen (TYLENOL) 325 MG tablet Take 2 tablets (650 mg total) by mouth every 8 (eight) hours as needed.     allopurinol (ZYLOPRIM) 100 MG tablet Take 1 tablet (100 mg total) by mouth 2 (two) times daily. (Patient not taking: Reported on 08/31/2022) 60 tablet 2   amLODipine (NORVASC) 10 MG tablet Take 1 tablet (10 mg total) by mouth daily.     ascorbic acid (VITAMIN C) 500 MG tablet Take 1 tablet (500 mg total) by mouth daily.     dexamethasone (DECADRON) 2 MG tablet Take 1  tablet (2 mg total) by mouth daily.     lenalidomide (REVLIMID) 15 MG capsule Take 1 capsule (15 mg total) by mouth daily. Take for 21 days, then hold for 7 days. Repeat every 28 days. (Patient not taking: Reported on 08/31/2022) 21 capsule 0   leptospermum manuka honey (MEDIHONEY) PSTE paste Apply 1 Application topically daily for 14 days. 14 mL 0   lidocaine (LIDODERM) 5 % Place 1 patch onto the skin daily. Remove & Discard patch within 12 hours or as directed by MD 30 patch 0   Multiple Vitamin (MULTIVITAMIN WITH MINERALS) TABS tablet Take 1 tablet by mouth daily.     multivitamin (RENA-VIT) TABS  tablet Take 1 tablet by mouth at bedtime.  0   naloxone (NARCAN) nasal spray 4 mg/0.1 mL SMARTSIG:1 Both Nares Daily     Nutritional Supplements (FEEDING SUPPLEMENT, NEPRO CARB STEADY,) LIQD Take 237 mLs by mouth 2 (two) times daily between meals.  0   pantoprazole (PROTONIX) 40 MG tablet Take 1 tablet (40 mg total) by mouth 2 (two) times daily. 30 tablet 0   prochlorperazine (COMPAZINE) 10 MG tablet Take 1 tablet (10 mg total) by mouth every 6 (six) hours as needed for nausea or vomiting. 30 tablet 0   sucralfate (CARAFATE) 1 GM/10ML suspension Take 10 mLs (1 g total) by mouth 4 (four) times daily -  with meals and at bedtime. 420 mL 0   zinc sulfate 220 (50 Zn) MG capsule Take 1 capsule (220 mg total) by mouth daily for 14 days. 14 capsule 0   No current facility-administered medications for this visit.    SURGICAL HISTORY:  Past Surgical History:  Procedure Laterality Date   AORTIC VALVE REPLACEMENT N/A 10/14/2013   Procedure: AORTIC VALVE REPLACEMENT (AVR);  Surgeon: Alleen Borne, MD;  Location: [facility] OR;  Service: Open Heart Surgery;  Laterality: N/A;   CARDIAC CATHETERIZATION     CATARACT EXTRACTION, BILATERAL     CHOLECYSTECTOMY  04/08/1988   ESOPHAGOGASTRODUODENOSCOPY (EGD) WITH PROPOFOL N/A 09/05/2022   Procedure: ESOPHAGOGASTRODUODENOSCOPY (EGD) WITH PROPOFOL;  Surgeon: Kathi Der, MD;  Location: [facility] ENDOSCOPY;  Service: Gastroenterology;  Laterality: N/A;   INTRAOPERATIVE TRANSESOPHAGEAL ECHOCARDIOGRAM N/A 10/14/2013   Procedure: INTRAOPERATIVE TRANSESOPHAGEAL ECHOCARDIOGRAM;  Surgeon: Alleen Borne, MD;  Location: [facility] OR;  Service: Open Heart Surgery;  Laterality: N/A;   IVC FILTER INSERTION N/A 09/06/2022   Procedure: IVC FILTER INSERTION;  Surgeon: Leonie Douglas, MD;  Location: [facility] INVASIVE CV LAB;  Service: Cardiovascular;  Laterality: N/A;   KYPHOPLASTY Bilateral 03/27/2022   Procedure: KYPHOPLASTY AND BIOPSY THORACIC ELEVEN;  Surgeon: Lisbeth Renshaw, MD;  Location:  [facility] OR;  Service: Neurosurgery;  Laterality: Bilateral;   LEFT AND RIGHT HEART CATHETERIZATION WITH CORONARY ANGIOGRAM N/A 09/16/2013   Procedure: LEFT AND RIGHT HEART CATHETERIZATION WITH CORONARY ANGIOGRAM;  Surgeon: Quintella Reichert, MD;  Location: [facility] CATH LAB;  Service: Cardiovascular;  Laterality: N/A;   TONSILLECTOMY      REVIEW OF SYSTEMS:  Constitutional: positive for anorexia, fatigue, and weight loss Eyes: negative Ears, nose, mouth, throat, and face: negative Respiratory: negative Cardiovascular: negative Gastrointestinal: negative Genitourinary:negative Integument/breast: negative Hematologic/lymphatic: negative Musculoskeletal:positive for arthralgias, bone pain, and muscle weakness Neurological: positive for weakness Behavioral/Psych: negative Endocrine: negative Allergic/Immunologic: negative   PHYSICAL EXAMINATION: General appearance: alert, cooperative, fatigued, and no distress Head: Normocephalic, without obvious abnormality, atraumatic Neck: no adenopathy, no JVD, supple, symmetrical, trachea midline, and thyroid not enlarged, symmetric, no tenderness/mass/nodules Lymph nodes: Cervical, supraclavicular, and axillary nodes  normal. Resp: clear to auscultation bilaterally Back: symmetric, no curvature. ROM normal. No CVA tenderness. Cardio: regular rate and rhythm, S1, S2 normal, no murmur, click, rub or gallop GI: soft, non-tender; bowel sounds normal; no masses,  no organomegaly Extremities: extremities normal, atraumatic, no cyanosis or edema Neurologic: Alert and oriented X 3, normal strength and tone. Normal symmetric reflexes. Normal coordination and gait  ECOG PERFORMANCE STATUS: 2 - Symptomatic, <50% confined to bed  Blood pressure 119/69, pulse (!) 55, temperature 98 F (36.7 C), temperature source Oral, resp. rate 18, SpO2 97 %.  LABORATORY DATA: Lab Results  Component Value Date   WBC 14.8 (H) 09/07/2022   HGB 8.9 (L) 09/07/2022   HCT 26.6 (L)  09/07/2022   MCV 89.0 09/07/2022   PLT 226 09/07/2022      Chemistry      Component Value Date/Time   NA 140 09/07/2022 0149   NA 141 10/06/2020 0939   K 3.5 09/07/2022 0149   CL 111 09/07/2022 0149   CO2 25 09/07/2022 0149   BUN 47 (H) 09/07/2022 0149   BUN 17 10/06/2020 0939   CREATININE 0.98 09/07/2022 0149   CREATININE 1.03 (H) 08/22/2022 1059   CREATININE 0.85 01/29/2016 0910      Component Value Date/Time   CALCIUM 7.6 (L) 09/07/2022 0149   ALKPHOS 105 09/01/2022 0112   AST 51 (H) 09/01/2022 0112   AST 33 08/22/2022 1059   ALT 33 09/01/2022 0112   ALT 24 08/22/2022 1059   BILITOT 0.8 09/01/2022 0112   BILITOT 0.4 08/22/2022 1059       RADIOGRAPHIC STUDIES: PERIPHERAL VASCULAR CATHETERIZATION  Result Date: 09/06/2022 DATE OF SERVICE: 09/06/2022  PATIENT:  Shaaron Adler  82 y.o. female  PRE-OPERATIVE DIAGNOSIS:  DVT, relative contraindication to anticoagulation  POST-OPERATIVE DIAGNOSIS:  Same  PROCEDURE:  Ultrasound guided right common femoral vein placement IVC filter placement Conscious sedation (10 minutes)  SURGEON:  Surgeon(s) and Role:    * Leonie Douglas, MD - Primary  ASSISTANT: none  ANESTHESIA:   local and IV sedation  EBL: minimal  BLOOD ADMINISTERED:none  DRAINS: none  LOCAL MEDICATIONS USED:  LIDOCAINE  SPECIMEN:  none  COUNTS: confirmed correct.  TOURNIQUET:  none  PATIENT DISPOSITION:  PACU - hemodynamically stable.  Delay start of Pharmacological VTE agent (>24hrs) due to surgical blood loss or risk of bleeding: no  INDICATION FOR PROCEDURE: KEANNAH NORTHROP is a 82 y.o. female with DVT, relative contraindication to anticoagulation. After careful discussion of risks, benefits, and alternatives the patient was offered IVC filter placement. The patient understood and wished to proceed.  OPERATIVE FINDINGS: successful IVC filter placement  DESCRIPTION OF PROCEDURE: After identification of the patient in the pre-operative holding area, the patient was transferred to  the operating room. The patient was positioned supine on the operating room table. Anesthesia was induced. The groins were prepped and draped in standard fashion. A surgical pause was performed confirming correct patient, procedure, and operative location.  Ultrasound guidance was used to obtain micropuncture access in the right common femoral vein.  Through the micro sheath, Bentson wire was navigated into the IVC.  The tract was dilated.  A Cook select IVC filter system was prepared per [employer] instructions and brought onto the table.  The sheath and flush catheter were then advanced into the infrarenal IVC.  (Venogram was performed.  We marked the renal veins.  An IVC filter was deployed under the renal veins.  This was uncomplicated.  A follow-up venogram was performed showing satisfactory IVC filter placement.  All endovascular equipment was removed.  Manual pressure was held on the vena puncture access site with good result.  Upon completion of the case instrument and sharps counts were confirmed correct. The patient was transferred to the PACU in good condition. I was present for all portions of the procedure.  FOLLOW UP PLAN: I will see the patient again in 1 to 2 months to schedule IVC filter retrieval.  Please call for questions in the interim.  Rande Brunt. Lenell Antu, MD FACS [facility] Phone Number: [phone] 09/06/2022 2:27 PM    VAS [place] LOWER EXTREMITY VENOUS (DVT)  Result Date: 08/31/2022  Lower Venous DVT Study Patient Name:  [facility]  Date of Exam:   08/31/2022 Medical Rec #: [id number]     Accession #:    [id number] Date of Birth: 15-Oct-1940     Patient Gender: F Patient Age:   65 years Exam Location:  [facility] Procedure:      VAS [place] LOWER EXTREMITY VENOUS (DVT) Referring Phys: Alwyn Ren GONFA --------------------------------------------------------------------------------  Indications: Bilateral leg swelling.  Risk Factors: Cancer - multiple  myeloma. Comparison Study: No prior studies. Performing Technologist: Jean Rosenthal RDMS, RVT  Examination Guidelines: A complete evaluation includes B-mode imaging, spectral Doppler, color Doppler, and power Doppler as needed of all accessible portions of each vessel. Bilateral testing is considered an integral part of a complete examination. Limited examinations for reoccurring indications may be performed as noted. The reflux portion of the exam is performed with the patient in reverse Trendelenburg.  +---------+---------------+---------+-----------+----------+--------------+ RIGHT    CompressibilityPhasicitySpontaneityPropertiesThrombus Aging +---------+---------------+---------+-----------+----------+--------------+ CFV      Full           Yes      Yes                                 +---------+---------------+---------+-----------+----------+--------------+ SFJ      Full                                                        +---------+---------------+---------+-----------+----------+--------------+ FV Prox  Full                                                        +---------+---------------+---------+-----------+----------+--------------+ FV Mid   Full                                                        +---------+---------------+---------+-----------+----------+--------------+ FV DistalFull                                                        +---------+---------------+---------+-----------+----------+--------------+ PFV      Full                                                        +---------+---------------+---------+-----------+----------+--------------+  POP      Full           Yes      Yes                                 +---------+---------------+---------+-----------+----------+--------------+ PTV      Full                                                         +---------+---------------+---------+-----------+----------+--------------+ PERO     Full                                                        +---------+---------------+---------+-----------+----------+--------------+   +---------+---------------+---------+-----------+----------+--------------+ LEFT     CompressibilityPhasicitySpontaneityPropertiesThrombus Aging +---------+---------------+---------+-----------+----------+--------------+ CFV      Full           Yes      Yes                                 +---------+---------------+---------+-----------+----------+--------------+ SFJ      Full                                                        +---------+---------------+---------+-----------+----------+--------------+ FV Prox  Full                                                        +---------+---------------+---------+-----------+----------+--------------+ FV Mid   Full                                                        +---------+---------------+---------+-----------+----------+--------------+ FV DistalFull                                                        +---------+---------------+---------+-----------+----------+--------------+ PFV      Full                                                        +---------+---------------+---------+-----------+----------+--------------+ POP      Full                                                        +---------+---------------+---------+-----------+----------+--------------+  PTV      Partial        Yes      Yes                  Acute          +---------+---------------+---------+-----------+----------+--------------+ PERO     None           No       No                   Acute          +---------+---------------+---------+-----------+----------+--------------+     Summary: RIGHT: - There is no evidence of deep vein thrombosis in the lower extremity.  - No cystic structure found in  the popliteal fossa.  LEFT: - Findings consistent with acute deep vein thrombosis involving the left posterior tibial veins, and left peroneal veins. - A cystic structure is found in the popliteal fossa.  *See table(s) above for measurements and observations. Electronically signed by Waverly Ferrari MD on 08/31/2022 at 7:59:15 PM.    Final    DG Pelvis 1-2 Views  Result Date: 08/31/2022 CLINICAL DATA:  Sacral wound.  History of multiple myeloma. EXAM: PELVIS - 1-2 VIEW COMPARISON:  PET-CT 08/22/2022 FINDINGS: No acute fracture or pelvic diastasis is identified. The lower sacrum and coccyx are poorly evaluated due to overlying stool. Degenerative changes are noted in the lower lumbar spine, and there are mild degenerative changes at the hips. Small lucencies consistent with known multiple myeloma are most conspicuous in the pubic rami. IMPRESSION: 1. No acute osseous abnormality identified. 2. Known multiple myeloma. Electronically Signed   By: Sebastian Ache M.D.   On: 08/31/2022 11:26   US RENAL  Result Date: 08/31/2022 CLINICAL DATA:  Acute kidney injury EXAM: RENAL / URINARY TRACT ULTRASOUND COMPLETE COMPARISON:  07/30/2022 FINDINGS: Right Kidney: Renal measurements: 10.5 x 4.7 x 5 cm = volume: 130 mL. Echogenicity within normal limits. No mass or hydronephrosis visualized. Left Kidney: Renal measurements: 10.2 x 6 x 5.2 cm = volume: 170 mL. Echogenicity within normal limits. No mass or hydronephrosis visualized. Bladder: Appears normal for degree of bladder distention. Other: Echogenic liver consistent with steatosis. 3.5 cm pelvic cyst which is left-sided, ovarian, and underestimated when compared to recent PET CT at which time recommendations were provided for follow-up. IMPRESSION: Normal ultrasound of the kidneys and bladder. Electronically Signed   By: Tiburcio Pea M.D.   On: 08/31/2022 06:33   CT Head Wo Contrast  Result Date: 08/31/2022 CLINICAL DATA:  Altered mental status. EXAM: CT HEAD  WITHOUT CONTRAST TECHNIQUE: Contiguous axial images were obtained from the base of the skull through the vertex without intravenous contrast. RADIATION DOSE REDUCTION: This exam was performed according to the departmental dose-optimization program which includes automated exposure control, adjustment of the mA and/or kV according to patient size and/or use of iterative reconstruction technique. COMPARISON:  May 15, 2020 FINDINGS: Brain: There is mild cerebral atrophy with widening of the extra-axial spaces and ventricular dilatation. There are areas of decreased attenuation within the white matter tracts of the supratentorial brain, consistent with microvascular disease changes. Vascular: No hyperdense vessel or unexpected calcification. Skull: Innumerable small lytic lesions are seen scattered throughout the skull. This represents a new finding when compared to the prior study. Sinuses/Orbits: No acute finding. Other: None. IMPRESSION: 1. No acute intracranial abnormality. 2. 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 3. Generalized cerebral  atrophy with widening of the extra-axial spaces and ventricular dilatation. 4. 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 Electronically Signed   By: Aram Candela M.D.   On: 08/31/2022 01:22   DG Chest 2 View  Result Date: 08/31/2022 CLINICAL DATA:  Chest pain. EXAM: CHEST - 2 VIEW COMPARISON:  July 30, 2022 FINDINGS: Multiple sternal wires are noted. The cardiac silhouette is borderline in size and stable in appearance. An artificial aortic valve is seen. Mild to moderate severity diffuse, chronic appearing increased interstitial lung markings are present. Mild atelectasis is seen within the left lung base. There is no evidence of a pleural effusion or pneumothorax. Multilevel degenerative changes are seen throughout the thoracic spine. IMPRESSION: 1.  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 2. Chronic appearing increased interstitial lung markings with mild left basilar atelectasis. Electronically Signed   By: Aram Candela M.D.   On: 08/31/2022 00:23   NM PET Image Initial (PI) Whole Body  Result Date: 08/26/2022 CLINICAL DATA:  Subsequent treatment strategy for multiple myeloma. EXAM: NUCLEAR MEDICINE PET WHOLE BODY TECHNIQUE: 7.6 mCi F-18 FDG was injected intravenously. Full-ring PET imaging was performed from the head to foot after the radiotracer. CT data was obtained and used for attenuation correction and anatomic localization. Fasting blood glucose: 86 mg/dl COMPARISON:  CT chest abdomen pelvis 07/30/2022 and PET 11/28/2021. FINDINGS: Mediastinal blood pool activity: SUV max 2.5 HEAD/NECK: No abnormal hypermetabolism. Incidental CT findings: None. CHEST: Focal hypermetabolism within the left ventricular cavity, SUV max 11.5. Correlation with CT is challenging without IV contrast. No additional abnormal hypermetabolism. Incidental CT findings: Atherosclerotic calcification of the aorta and coronary arteries. Aortic valve repair. Heart is enlarged. No pericardial or pleural effusion. ABDOMEN/PELVIS: Focal hypermetabolism in the medial limb left adrenal gland, SUV max 3.9, corresponds to an 11 mm low-density nodule (4/120). CT appearance is unchanged from 11/28/2021. 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 No additional abnormal hypermetabolism. Incidental CT findings: Liver is decreased in attenuation diffusely. Cholecystectomy. Adrenal glands, kidneys, spleen, pancreas, stomach and bowel are otherwise grossly unremarkable. 5.3 cm simple appearing left adnexal cyst, as on 11/28/2021. SKELETON: Intensely hypermetabolic lesions are riddled throughout the axial and appendicular skeleton. Index lesion in L5, SUV max 14.1. Incidental CT findings: Degenerative changes in the spine.  Old rib fractures. EXTREMITIES: No additional abnormal  hypermetabolism in the soft tissues. Incidental CT findings: None. IMPRESSION: 1. Widespread osseous metastatic disease, compatible with multiple myeloma. 2. Focal hypermetabolism within the left ventricular cavity may correspond to a papillary muscle. Further evaluation is limited without IV contrast. 3. Hepatic steatosis. 4. 5.3 cm simple appearing left adnexal cyst, unchanged from 11/28/2021. Recommend follow-up US in 6-12 months. Note: This recommendation does not apply to premenarchal patients and to those with increased risk (genetic, family history, elevated tumor markers or other high-risk factors) of ovarian cancer. Reference: JACR 2020 Feb; 17(2):248-254 5. Aortic atherosclerosis (ICD10-I70.0). Coronary artery calcification. Electronically Signed   By: Leanna Battles M.D.   On: 08/26/2022 12:14   CT BONE MARROW BIOPSY & ASPIRATION  Result Date: 08/20/2022 INDICATION: Multiple myeloma EXAM: CT GUIDED BONE MARROW ASPIRATES AND BIOPSY MEDICATIONS: None. ANESTHESIA/SEDATION: Versed 0.5 mg IV Fentanyl 75 mcg IV Moderate Sedation Time:  10 minutes The patient was continuously monitored during the procedure by the interventional radiology nurse under my direct supervision. TECHNIQUE: Multidetector CT imaging of the pelvis was performed following the standard protocol without IV contrast. RADIATION DOSE REDUCTION: This exam was performed according to the departmental dose-optimization program  which includes automated exposure control, adjustment of the mA and/or kV according to patient size and/or use of iterative reconstruction technique. COMPLICATIONS: None immediate. PROCEDURE: The procedure was explained to the patient. The risks and benefits of the procedure were discussed and the patient's questions were addressed. Informed consent was obtained from the patient. The patient was placed prone on CT table. Images of the pelvis were obtained. The right side of back was prepped and draped in sterile fashion.  The skin and right posterior ilium were anesthetized with 1% lidocaine. 11 gauge bone needle was directed into the right ilium with CT guidance. Two aspirates and 1 core biopsy were obtained. Bandage placed over the puncture site. IMPRESSION: CT guided bone marrow aspiration and core biopsy. Electronically Signed   By: Acquanetta Belling M.D.   On: 08/20/2022 13:36    ASSESSMENT AND PLAN: This is a very pleasant 82 years old white female with  1) history of stage I (T1a, N0, M0) thymoma type AB diagnosed in October 2023 status post resection under the care of Dr. Dorris Fetch on January 28, 2022 with close resection margin and microscopic infiltration of the capsule. 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  The patient is expected to start today the first cycle of her systemic therapy with daratumumab, Revlimid 15 mg p.o. daily for 21 days every 4 weeks in addition to Decadron 20 mg weekly with the treatment. She is very symptomatic from her multiple myeloma and she would like to proceed with treatment as soon as possible.  She came from the skilled nursing facility on the stretcher and I will try my best to get her started on treatment today rather than tomorrow. I will see her back for follow-up visit in 2 weeks for evaluation and management of any adverse effect of her treatment. The patient will also have some teaching about her treatment with Revlimid from the oral pharmacist. She was advised to call immediately if she has any other concerning symptoms in the interval. The patient voices understanding of current disease status and treatment options and is in agreement with the current care plan.  All questions were answered. The patient knows to call the clinic with any problems, questions or concerns. We can certainly see the patient much sooner if necessary.  The total time spent in the appointment was 30 minutes.  Disclaimer: This note was dictated with voice recognition  software. Similar sounding words can inadvertently be transcribed and may not be corrected upon review.

## 2022-09-11 NOTE — Patient Instructions (Signed)
[facility]  Discharge Instructions: Thank you for choosing [facility] to provide your oncology and hematology care.   If you have a lab appointment with the Cancer Center, please go directly to the Cancer Center and check in at the registration area.   Wear comfortable clothing and clothing appropriate for easy access to any Portacath or PICC line.   We strive to give you quality time with your provider. You may need to reschedule your appointment if you arrive late (15 or more minutes).  Arriving late affects you and other patients whose appointments are after yours.  Also, if you miss three or more appointments without notifying the office, you may be dismissed from the clinic at the provider's discretion.      For prescription refill requests, have your pharmacy contact our office and allow 72 hours for refills to be completed.    Today you received the following chemotherapy and/or immunotherapy agents Daratumumab SQ.Daratumumab; Hyaluronidase Injection What is this medication? DARATUMUMAB; HYALURONIDASE (dar a toom ue mab; hye al ur ON i dase) treats multiple myeloma, a type of bone marrow cancer. Daratumumab works by blocking a protein that causes cancer cells to grow and multiply. This helps to slow or stop the spread of cancer cells. Hyaluronidase works by increasing the absorption of other medications in the body to help them work better. This medication may also be used treat amyloidosis, a condition that causes the buildup of a protein (amyloid) in your body. It works by reducing the buildup of this protein, which decreases symptoms. It is a combination medication that contains a monoclonal antibody. This medicine may be used for other purposes; ask your health care provider or pharmacist if you have questions. COMMON BRAND NAME(S): DARZALEX FASPRO What should I tell my care team before I take this medication? They need to know if you  have any of these conditions: Heart disease Infection, such as chickenpox, cold sores, herpes, hepatitis B Lung or breathing disease An unusual or allergic reaction to daratumumab, hyaluronidase, other medications, foods, dyes, or preservatives Pregnant or trying to get pregnant Breast-feeding How should I use this medication? This medication is injected under the skin. It is given by your care team in a hospital or clinic setting. Talk to your care team about the use of this medication in children. Special care may be needed. Overdosage: If you think you have taken too much of this medicine contact a poison control center or emergency room at once. NOTE: This medicine is only for you. Do not share this medicine with others. What if I miss a dose? Keep appointments for follow-up doses. It is important not to miss your dose. Call your care team if you are unable to keep an appointment. What may interact with this medication? Interactions have not been studied. This list may not describe all possible interactions. Give your health care provider a list of all the medicines, herbs, non-prescription drugs, or dietary supplements you use. Also tell them if you smoke, drink alcohol, or use illegal drugs. Some items may interact with your medicine. What should I watch for while using this medication? Your condition will be monitored carefully while you are receiving this medication. This medication can cause serious allergic reactions. To reduce your risk, your care team may give you other medication to take before receiving this one. Be sure to follow the directions from your care team. This medication can affect the results of blood  tests to match your blood type. These changes can last for up to 6 months after the final dose. Your care team will do blood tests to match your blood type before you start treatment. Tell all of your care team that you are being treated with this medication before  receiving a blood transfusion. This medication can affect the results of some tests used to determine treatment response; extra tests may be needed to evaluate response. Talk to your care team if you wish to become pregnant or think you are pregnant. This medication can cause serious birth defects if taken during pregnancy and for 3 months after the last dose. A reliable form of contraception is recommended while taking this medication and for 3 months after the last dose. Talk to your care team about effective forms of contraception. Do not breast-feed while taking this medication. What side effects may I notice from receiving this medication? Side effects that you should report to your care team as soon as possible: Allergic reactions--skin rash, itching, hives, swelling of the face, lips, tongue, or throat Heart rhythm changes--fast or irregular heartbeat, dizziness, feeling faint or lightheaded, chest pain, trouble breathing Infection--fever, chills, cough, sore throat, wounds that don't heal, pain or trouble when passing urine, general feeling of discomfort or being unwell Infusion reactions--chest pain, shortness of breath or trouble breathing, feeling faint or lightheaded Sudden eye pain or change in vision such as blurry vision, seeing halos around lights, vision loss Unusual bruising or bleeding Side effects that usually do not require medical attention (report to your care team if they continue or are bothersome): Constipation Diarrhea Fatigue Nausea Pain, tingling, or numbness in the hands or feet Swelling of the ankles, hands, or feet This list may not describe all possible side effects. Call your doctor for medical advice about side effects. You may report side effects to FDA at 1-800-FDA-1088. Where should I keep my medication? This medication is given in a hospital or clinic. It will not be stored at home. NOTE: This sheet is a summary. It may not cover all possible information.  If you have questions about this medicine, talk to your doctor, pharmacist, or health care provider.  2024 Elsevier/Gold Standard (2021-07-31 00:00:00)      To help prevent nausea and vomiting after your treatment, we encourage you to take your nausea medication as directed.  BELOW ARE SYMPTOMS THAT SHOULD BE REPORTED IMMEDIATELY: *FEVER GREATER THAN 100.4 F (38 C) OR HIGHER *CHILLS OR SWEATING *NAUSEA AND VOMITING THAT IS NOT CONTROLLED WITH YOUR NAUSEA MEDICATION *UNUSUAL SHORTNESS OF BREATH *UNUSUAL BRUISING OR BLEEDING *URINARY PROBLEMS (pain or burning when urinating, or frequent urination) *BOWEL PROBLEMS (unusual diarrhea, constipation, pain near the anus) TENDERNESS IN MOUTH AND THROAT WITH OR WITHOUT PRESENCE OF ULCERS (sore throat, sores in mouth, or a toothache) UNUSUAL RASH, SWELLING OR PAIN  UNUSUAL VAGINAL DISCHARGE OR ITCHING   Items with * indicate a potential emergency and should be followed up as soon as possible or go to the Emergency Department if any problems should occur.  Please show the CHEMOTHERAPY ALERT CARD or IMMUNOTHERAPY ALERT CARD at check-in to the Emergency Department and triage nurse.  Should you have questions after your visit or need to cancel or reschedule your appointment, please contact [facility]  Dept: [phone]  and follow the prompts.  Office hours are 8:00 a.m. to 4:30 p.m. Monday - Friday. Please note that voicemails left after 4:00 p.m. may not be  returned until the following business day.  We are closed weekends and major holidays. You have access to a nurse at all times for urgent questions. Please call the main number to the clinic Dept: [phone] and follow the prompts.   For any non-urgent questions, you may also contact your provider using MyChart. We now offer e-Visits for anyone 72 and older to request care online for non-urgent symptoms. For details visit [email or web address].   Also  download the MyChart app! Go to the app store, search "MyChart", open the app, select [place], and log in with your MyChart username and password.

## 2022-09-11 NOTE — Telephone Encounter (Signed)
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  Counseled patient's son on administration, dosing, side effects, monitoring, drug-food interactions, safe handling, storage, and disposal.  Patient will take Revlimid 15mg  capsules, 1 capsule by mouth once daily, without regard to food, with a full glass of water.  Revlimid will be given 21 days on, 7 days off, repeat every 28 days.  Revlimid start date: 09/11/22  Adverse effects of Revlimid include but are not limited to: nausea, GI upset, rash, fatigue, and decreased blood counts.    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 Dr. Arbutus Ped to send in prescription for acyclovir for VZV PPX while patient on daratumumab.   Reviewed importance of keeping a medication schedule and plan for any missed doses. No barriers to medication adherence identified.  Medication reconciliation performed and medication/allergy list updated.  Insurance authorization for Revlimid has been obtained. Revlimid prescription is being dispensed from [facility] specialty pharmacy as it is a limited distribution medication.  All questions answered.  Patient's son [name] voiced understanding and appreciation.   Medication education handout given to patient's son. Patient's son knows to call the office with questions or concerns. Oral Chemotherapy Clinic phone number provided.   Lenord Carbo, PharmD, BCPS, BCOP Hematology/Oncology Clinical Pharmacist [facility] Oral Chemotherapy Navigation Clinics [phone] 09/11/2022 11:05 AM

## 2022-09-11 NOTE — Progress Notes (Unsigned)
Labs ordered.

## 2022-09-12 ENCOUNTER — Inpatient Hospital Stay: Payer: Medicare PPO

## 2022-09-12 DIAGNOSIS — I129 Hypertensive chronic kidney disease with stage 1 through stage 4 chronic kidney disease, or unspecified chronic kidney disease: Secondary | ICD-10-CM | POA: Diagnosis not present

## 2022-09-12 DIAGNOSIS — C9000 Multiple myeloma not having achieved remission: Secondary | ICD-10-CM | POA: Diagnosis not present

## 2022-09-12 DIAGNOSIS — G894 Chronic pain syndrome: Secondary | ICD-10-CM | POA: Diagnosis not present

## 2022-09-12 DIAGNOSIS — D638 Anemia in other chronic diseases classified elsewhere: Secondary | ICD-10-CM | POA: Diagnosis not present

## 2022-09-13 DIAGNOSIS — C9000 Multiple myeloma not having achieved remission: Secondary | ICD-10-CM | POA: Diagnosis not present

## 2022-09-13 DIAGNOSIS — G9340 Encephalopathy, unspecified: Secondary | ICD-10-CM | POA: Diagnosis not present

## 2022-09-13 DIAGNOSIS — N179 Acute kidney failure, unspecified: Secondary | ICD-10-CM | POA: Diagnosis not present

## 2022-09-13 DIAGNOSIS — L89159 Pressure ulcer of sacral region, unspecified stage: Secondary | ICD-10-CM | POA: Diagnosis not present

## 2022-09-17 ENCOUNTER — Telehealth: Payer: Self-pay | Admitting: Medical Oncology

## 2022-09-17 ENCOUNTER — Encounter: Payer: Self-pay | Admitting: Internal Medicine

## 2022-09-17 ENCOUNTER — Encounter: Payer: Self-pay | Admitting: Physician Assistant

## 2022-09-17 DIAGNOSIS — C9000 Multiple myeloma not having achieved remission: Secondary | ICD-10-CM | POA: Diagnosis not present

## 2022-09-17 DIAGNOSIS — I350 Nonrheumatic aortic (valve) stenosis: Secondary | ICD-10-CM | POA: Diagnosis not present

## 2022-09-17 DIAGNOSIS — D649 Anemia, unspecified: Secondary | ICD-10-CM | POA: Diagnosis not present

## 2022-09-17 DIAGNOSIS — Z5112 Encounter for antineoplastic immunotherapy: Secondary | ICD-10-CM | POA: Diagnosis not present

## 2022-09-17 DIAGNOSIS — Z85238 Personal history of other malignant neoplasm of thymus: Secondary | ICD-10-CM | POA: Diagnosis not present

## 2022-09-17 DIAGNOSIS — L8990 Pressure ulcer of unspecified site, unspecified stage: Secondary | ICD-10-CM | POA: Diagnosis not present

## 2022-09-17 DIAGNOSIS — I1310 Hypertensive heart and chronic kidney disease without heart failure, with stage 1 through stage 4 chronic kidney disease, or unspecified chronic kidney disease: Secondary | ICD-10-CM | POA: Diagnosis not present

## 2022-09-17 DIAGNOSIS — E7800 Pure hypercholesterolemia, unspecified: Secondary | ICD-10-CM | POA: Diagnosis not present

## 2022-09-17 DIAGNOSIS — G893 Neoplasm related pain (acute) (chronic): Secondary | ICD-10-CM | POA: Diagnosis not present

## 2022-09-17 NOTE — Telephone Encounter (Signed)
Left message on VM for son, [name] that we would try to do education while she is in the infusion room on Thursday.  Message to scheduler to schedule.

## 2022-09-17 NOTE — Telephone Encounter (Signed)
Pt education - Can the pt education she missed be r/s to Thursday during her observation time? Message forwarded to nurse educators.

## 2022-09-17 NOTE — Progress Notes (Unsigned)
Palliative Medicine [facility]  [phone] [phone]   Name: [name] Date: 09/17/2022 MRN: [id number]  DOB: 08/06/1940  Patient Care Team: [name], MD as PCP - General (Internal Medicine) Quintella Reichert, MD as PCP - Cardiology (Cardiology) Quintella Reichert, MD as Consulting Physician (Cardiology)    REASON FOR CONSULTATION: [name] is a 82 y.o. 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 Palliative ask to see for symptom management and goals of care.    SOCIAL HISTORY:     reports that she has never smoked. She has never used smokeless tobacco. She reports that she does not drink alcohol and does not use drugs.  ADVANCE DIRECTIVES:  Advanced directives on file  CODE STATUS: DNR  PAST 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 by echo 10/2021   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/2015   Carotid artery stenosis    < 50% stenosis bilaterally by doppler 07/2016   Coronary artery disease 2008   Coronary Ca score of 331 with minimal multivessel plaque < 25% stenosis by coronary CTA 8/23   Heart murmur    per pt   Hypercholesteremia    Hypertension    Obesity    Osteopenia    declines treatment   Pneumonia 1995   Pulmonary HTN (HCC)    mild to moderate by echo 7/23 with PASP   Shoulder pain    Due to arthritis   Vitamin D insufficiency     PAST SURGICAL HISTORY:  Past Surgical History:  Procedure Laterality Date   AORTIC VALVE REPLACEMENT N/A 10/14/2013   Procedure: AORTIC VALVE REPLACEMENT (AVR);  Surgeon: Alleen Borne,  MD;  Location: [facility] OR;  Service: Open Heart Surgery;  Laterality: N/A;   CARDIAC CATHETERIZATION     CATARACT EXTRACTION, BILATERAL     CHOLECYSTECTOMY  04/08/1988   ESOPHAGOGASTRODUODENOSCOPY (EGD) WITH PROPOFOL N/A 09/05/2022   Procedure: ESOPHAGOGASTRODUODENOSCOPY (EGD) WITH PROPOFOL;  Surgeon: Kathi Der, MD;  Location: [facility] ENDOSCOPY;  Service: Gastroenterology;  Laterality: N/A;   INTRAOPERATIVE TRANSESOPHAGEAL ECHOCARDIOGRAM N/A 10/14/2013   Procedure: INTRAOPERATIVE TRANSESOPHAGEAL ECHOCARDIOGRAM;  Surgeon: Alleen Borne, MD;  Location: [facility] OR;  Service: Open Heart Surgery;  Laterality: N/A;   IVC FILTER INSERTION N/A 09/06/2022   Procedure: IVC FILTER INSERTION;  Surgeon: Leonie Douglas, MD;  Location: [facility] INVASIVE CV LAB;  Service: Cardiovascular;  Laterality: N/A;   KYPHOPLASTY Bilateral 03/27/2022   Procedure: KYPHOPLASTY AND BIOPSY THORACIC ELEVEN;  Surgeon: Lisbeth Renshaw, MD;  Location: [facility] OR;  Service: Neurosurgery;  Laterality: Bilateral;   LEFT AND RIGHT HEART CATHETERIZATION WITH CORONARY ANGIOGRAM N/A 09/16/2013   Procedure: LEFT AND RIGHT HEART CATHETERIZATION WITH CORONARY ANGIOGRAM;  Surgeon: Quintella Reichert, MD;  Location: [facility] CATH LAB;  Service: Cardiovascular;  Laterality: N/A;   TONSILLECTOMY      HEMATOLOGY/ONCOLOGY HISTORY:  Oncology History  Type AB thymoma (HCC)  03/05/2022 Initial Diagnosis   Type AB thymoma (HCC)   03/05/2022 Cancer Staging   Staging form: Thymus, AJCC 8th Edition - Pathologic  stage from 03/05/2022: No Stage Recommended (pT1a, cN0, cM0) - Signed by Lonie Peak, MD on 03/05/2022 Stage prefix: Initial diagnosis Residual tumor (R): R1 - Microscopic   Multiple myeloma (HCC)  08/27/2022 Initial Diagnosis   Multiple myeloma (HCC)   09/06/2022 - 09/06/2022 Chemotherapy   Patient is on Treatment Plan : MYELOMA NEWLY DIAGNOSED Daratumumab IV + Lenalidomide + Dexamethasone Weekly (DaraRd) q28d     09/11/2022 -  Chemotherapy   Patient is  on Treatment Plan : MYELOMA NEWLY DIAGNOSED Daratumumab SQ + Lenalidomide + Dexamethasone Weekly (DaraRd) q28d     09/11/2022 Cancer Staging   Staging form: Plasma Cell Myeloma and Plasma Cell Disorders, AJCC 8th Edition - Clinical stage from 09/11/2022: RISS Stage II (Beta-2-microglobulin (mg/L): 8.7, Albumin (g/dL): 2.3, ISS: Stage III, High-risk cytogenetics: Absent, LDH: Normal) - Signed by Si Gaul, MD on 09/11/2022 Stage prefix: Initial diagnosis Beta 2 microglobulin range (mg/L): Greater than or equal to 5.5 Albumin range (g/dL): Less than 3.5 Cytogenetics: Other mutation Serum calcium level: Normal Serum creatinine level: Elevated Bone disease on imaging: Present     ALLERGIES:  is allergic to crestor [rosuvastatin calcium], lactose, lipitor [atorvastatin], pravastatin, simvastatin, tramadol, zetia [ezetimibe], codeine, penicillins, sulfa antibiotics, and vancomycin.  MEDICATIONS:  Current Outpatient Medications  Medication Sig Dispense Refill   acetaminophen (TYLENOL) 325 MG tablet Take 2 tablets (650 mg total) by mouth every 8 (eight) hours as needed.     acyclovir (ZOVIRAX) 200 MG capsule Take 200 mg by mouth 2 (two) times daily.     allopurinol (ZYLOPRIM) 100 MG tablet Take 1 tablet (100 mg total) by mouth 2 (two) times daily. 60 tablet 2   amLODipine (NORVASC) 10 MG tablet Take 1 tablet (10 mg total) by mouth daily.     ascorbic acid (VITAMIN C) 500 MG tablet Take 1 tablet (500 mg total) by mouth daily.     daratumumab-hyaluronidase-fihj (DARZALEX FASPRO) 1800-30000 MG-UT/15ML SOLN Inject 1,800 mg into the skin once.     dexamethasone (DECADRON) 2 MG tablet Take 1 tablet (2 mg total) by mouth daily.     dexamethasone (DECADRON) 4 MG tablet Take 20 mg by mouth once a week. For multiple myeloma as part of her treatment (Patient not taking: Reported on 09/11/2022)     lenalidomide (REVLIMID) 15 MG capsule Take 1 capsule (15 mg total) by mouth daily. Take for 21 days, then hold  for 7 days. Repeat every 28 days. (Patient not taking: Reported on 08/31/2022) 21 capsule 0   leptospermum manuka honey (MEDIHONEY) PSTE paste Apply 1 Application topically daily for 14 days. 14 mL 0   lidocaine (LIDODERM) 5 % Place 1 patch onto the skin daily. Remove & Discard patch within 12 hours or as directed by MD 30 patch 0   Multiple Vitamin (MULTIVITAMIN WITH MINERALS) TABS tablet Take 1 tablet by mouth daily.     multivitamin (RENA-VIT) TABS tablet Take 1 tablet by mouth at bedtime.  0   naloxone (NARCAN) nasal spray 4 mg/0.1 mL SMARTSIG:1 Both Nares Daily (Patient not taking: Reported on 09/11/2022)     Nutritional Supplements (FEEDING SUPPLEMENT, NEPRO CARB STEADY,) LIQD Take 237 mLs by mouth 2 (two) times daily between meals.  0   pantoprazole (PROTONIX) 40 MG tablet Take 1 tablet (40 mg total) by mouth 2 (two) times daily. 30 tablet 0   prochlorperazine (COMPAZINE) 10 MG tablet Take 1 tablet (10 mg total) by mouth every 6 (six) hours as needed for nausea or vomiting. (Patient not taking:  Reported on 09/11/2022) 30 tablet 0   sucralfate (CARAFATE) 1 GM/10ML suspension Take 10 mLs (1 g total) by mouth 4 (four) times daily -  with meals and at bedtime. 420 mL 0   zinc sulfate 220 (50 Zn) MG capsule Take 1 capsule (220 mg total) by mouth daily for 14 days. 14 capsule 0   No current facility-administered medications for this visit.    VITAL SIGNS: There were no vitals taken for this visit. There were no vitals filed for this visit.  Estimated body mass index is 27.53 kg/m as calculated from the following:   Height as of 08/31/22: 5\' 1"  (1.549 m).   Weight as of 09/08/22: 145 lb 11.6 oz (66.1 kg).  LABS: CBC:    Component Value Date/Time   WBC 15.7 (H) 09/11/2022 1020   WBC 14.8 (H) 09/07/2022 0149   HGB 9.4 (L) 09/11/2022 1020   HCT 27.9 (L) 09/11/2022 1020   PLT 243 09/11/2022 1020   MCV 89.4 09/11/2022 1020   NEUTROABS 13.2 (H) 09/11/2022 1020   LYMPHSABS 1.5 09/11/2022 1020    MONOABS 0.9 09/11/2022 1020   EOSABS 0.0 09/11/2022 1020   BASOSABS 0.0 09/11/2022 1020   Comprehensive Metabolic Panel:    Component Value Date/Time   NA 134 (L) 09/11/2022 1042   NA 141 10/06/2020 0939   K 4.1 09/11/2022 1042   CL 104 09/11/2022 1042   CO2 28 09/11/2022 1042   BUN 37 (H) 09/11/2022 1042   BUN 17 10/06/2020 0939   CREATININE 0.75 09/11/2022 1042   CREATININE 0.85 01/29/2016 0910   GLUCOSE 122 (H) 09/11/2022 1042   CALCIUM 7.4 (L) 09/11/2022 1042   AST 91 (H) 09/11/2022 1042   ALT 97 (H) 09/11/2022 1042   ALKPHOS 270 (H) 09/11/2022 1042   BILITOT 0.7 09/11/2022 1042   PROT 6.9 09/11/2022 1042   PROT 6.7 01/28/2017 0957   ALBUMIN 1.9 (L) 09/11/2022 1042   ALBUMIN 4.3 01/28/2017 0957    RADIOGRAPHIC STUDIES: CT Head Wo Contrast  Result Date: 08/31/2022 CLINICAL DATA:  Altered mental status. EXAM: CT HEAD WITHOUT CONTRAST TECHNIQUE: Contiguous axial images were obtained from the base of the skull through the vertex without intravenous contrast. RADIATION DOSE REDUCTION: This exam was performed according to the departmental dose-optimization program which includes automated exposure control, adjustment of the mA and/or kV according to patient size and/or use of iterative reconstruction technique. COMPARISON:  May 15, 2020 FINDINGS: Brain: There is mild cerebral atrophy with widening of the extra-axial spaces and ventricular dilatation. There are areas of decreased attenuation within the white matter tracts of the supratentorial brain, consistent with microvascular disease changes. Vascular: No hyperdense vessel or unexpected calcification. Skull: Innumerable small lytic lesions are seen scattered throughout the skull. This represents a new finding when compared to the prior study. Sinuses/Orbits: No acute finding. Other: None. IMPRESSION: 1. No acute intracranial abnormality. 2. Innumerable small lytic lesions scattered throughout the skull, consistent with osseous  metastatic disease versus multiple myeloma. 3. Generalized cerebral atrophy with widening of the extra-axial spaces and ventricular dilatation. 4. 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 Electronically Signed   By: Aram Candela M.D.   On: 08/31/2022 01:22   NM PET Image Initial (PI) Whole Body  Result Date: 08/26/2022 CLINICAL DATA:  Subsequent treatment strategy for multiple myeloma. EXAM: NUCLEAR MEDICINE PET WHOLE BODY TECHNIQUE: 7.6 mCi F-18 FDG was injected intravenously. Full-ring PET imaging was performed from the head to foot after the radiotracer. CT  data was obtained and used for attenuation correction and anatomic localization. Fasting blood glucose: 86 mg/dl COMPARISON:  CT chest abdomen pelvis 07/30/2022 and PET 11/28/2021. FINDINGS: Mediastinal blood pool activity: SUV max 2.5 HEAD/NECK: No abnormal hypermetabolism. Incidental CT findings: None. CHEST: Focal hypermetabolism within the left ventricular cavity, SUV max 11.5. Correlation with CT is challenging without IV contrast. No additional abnormal hypermetabolism. Incidental CT findings: Atherosclerotic calcification of the aorta and coronary arteries. Aortic valve repair. Heart is enlarged. No pericardial or pleural effusion. ABDOMEN/PELVIS: Focal hypermetabolism in the medial limb left adrenal gland, SUV max 3.9, corresponds to an 11 mm low-density nodule (4/120). CT appearance is unchanged from 11/28/2021. 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 No additional abnormal hypermetabolism. Incidental CT findings: Liver is decreased in attenuation diffusely. Cholecystectomy. Adrenal glands, kidneys, spleen, pancreas, stomach and bowel are otherwise grossly unremarkable. 5.3 cm simple appearing left adnexal cyst, as on 11/28/2021. SKELETON: Intensely hypermetabolic lesions are riddled throughout the axial and appendicular skeleton. Index lesion in L5, SUV max 14.1. Incidental CT findings: Degenerative changes in the  spine.  Old rib fractures. EXTREMITIES: No additional abnormal hypermetabolism in the soft tissues. Incidental CT findings: None. IMPRESSION: 1. Widespread osseous metastatic disease, compatible with multiple myeloma. 2. Focal hypermetabolism within the left ventricular cavity may correspond to a papillary muscle. Further evaluation is limited without IV contrast. 3. Hepatic steatosis. 4. 5.3 cm simple appearing left adnexal cyst, unchanged from 11/28/2021. Recommend follow-up US in 6-12 months. Note: This recommendation does not apply to premenarchal patients and to those with increased risk (genetic, family history, elevated tumor markers or other high-risk factors) of ovarian cancer. Reference: JACR 2020 Feb; 17(2):248-254 5. Aortic atherosclerosis (ICD10-I70.0). Coronary artery calcification. Electronically Signed   By: Leanna Battles M.D.   On: 08/26/2022 12:14    PERFORMANCE STATUS (ECOG) : {CHL ONC ECOG ZO:[id number]}  Review of Systems Unless otherwise noted, a complete review of systems is negative.  Physical Exam General: NAD Cardiovascular: regular rate and rhythm Pulmonary: clear ant fields Abdomen: soft, nontender, + bowel sounds Extremities: no edema, no joint deformities Skin: no rashes Neurological:  IMPRESSION: *** I introduced myself, Maygan RN, and Palliative's role in collaboration with the oncology team. Concept of Palliative Care was introduced as specialized medical care for people and their families living with serious illness.  It focuses on providing relief from the symptoms and stress of a serious illness.  The goal is to improve quality of life for both the patient and the family. Values and goals of care important to patient and family were attempted to be elicited.    We discussed *** current illness and what it means in the larger context of *** on-going co-morbidities. Natural disease trajectory and expectations were discussed.  I discussed the importance of  continued conversation with family and their medical providers regarding overall plan of care and treatment options, ensuring decisions are within the context of the patients values and GOCs.  PLAN: Established therapeutic relationship. Education provided on palliative's role in collaboration with their Oncology/Radiation team. I will plan to see patient back in 2-4 weeks in collaboration to other oncology appointments.    Patient expressed understanding and was in agreement with this plan. She also understands that She can call the clinic at any time with any questions, concerns, or complaints.   Thank you for your referral and allowing Palliative to assist in Mrs. Rudene Anda Sinopoli's care.   Number and complexity of problems addressed: ***HIGH - 1 or more chronic illnesses  with SEVERE exacerbation, progression, or side effects of treatment - advanced cancer, pain. Any controlled substances utilized were prescribed in the context of palliative care.   Visit consisted of counseling and education dealing with the complex and emotionally intense issues of symptom management and palliative care in the setting of serious and potentially life-threatening illness.Greater than 50%  of this time was spent counseling and coordinating care related to the above assessment and plan.  Signed by: Willette Alma, AGPCNP-BC Palliative Medicine Team/[facility]   *Please note that this is a verbal dictation therefore any spelling or grammatical errors are due to the "Dragon [employer] One" system interpretation.

## 2022-09-18 ENCOUNTER — Inpatient Hospital Stay: Payer: Medicare PPO | Admitting: Dietician

## 2022-09-18 ENCOUNTER — Other Ambulatory Visit: Payer: Medicare PPO

## 2022-09-18 ENCOUNTER — Ambulatory Visit: Payer: Medicare PPO

## 2022-09-18 ENCOUNTER — Telehealth: Payer: Self-pay

## 2022-09-18 ENCOUNTER — Encounter: Payer: Medicare PPO | Admitting: Dietician

## 2022-09-18 ENCOUNTER — Telehealth: Payer: Self-pay | Admitting: Medical Oncology

## 2022-09-18 LAB — PRETREATMENT RBC PHENOTYPE

## 2022-09-18 NOTE — Telephone Encounter (Addendum)
Son aware of appts.[facility] notified of pts appts tomorrow.

## 2022-09-18 NOTE — Telephone Encounter (Signed)
Faxed appt list to [facility].

## 2022-09-18 NOTE — Telephone Encounter (Signed)
This nurse reached out to [facility] and spoke with the nurse for this patient and verified that patient will be arriving via stretcher to her appointment on 09/19/22.  Nurse confirmed stretcher arrival and patient will need bed for her infusion due to bad decubitus. This nurse acknowledged understanding.  No further questions or concerns noted at this time.

## 2022-09-18 NOTE — Patient Instructions (Signed)
Daratumumab Injection What is this medication? DARATUMUMAB (dar a toom ue mab) treats multiple myeloma, a type of bone marrow cancer. It works by helping your immune system slow or stop the spread of cancer cells. It is a monoclonal antibody. This medicine may be used for other purposes; ask your health care provider or pharmacist if you have questions. COMMON BRAND NAME(S): DARZALEX What should I tell my care team before I take this medication? They need to know if you have any of these conditions: Hereditary fructose intolerance Infection, such as chickenpox, herpes, hepatitis B Lung or breathing disease, such as asthma, COPD An unusual or allergic reaction to daratumumab, sorbitol, other medications, foods, dyes, or preservatives Pregnant or trying to get pregnant Breastfeeding How should I use this medication? This medication is injected into a vein. It is given by your care team in a hospital or clinic setting. Talk to your care team about the use of this medication in children. Special care may be needed. Overdosage: If you think you have taken too much of this medicine contact a poison control center or emergency room at once. NOTE: This medicine is only for you. Do not share this medicine with others. What if I miss a dose? Keep appointments for follow-up doses. It is important not to miss your dose. Call your care team if you are unable to keep an appointment. What may interact with this medication? Interactions have not been studied. This list may not describe all possible interactions. Give your health care provider a list of all the medicines, herbs, non-prescription drugs, or dietary supplements you use. Also tell them if you smoke, drink alcohol, or use illegal drugs. Some items may interact with your medicine. What should I watch for while using this medication? Your condition will be monitored carefully while you are receiving this medication. This medication can cause  serious allergic reactions. To reduce your risk, your care team may give you other medication to take before receiving this one. Be sure to follow the directions from your care team. This medication can affect the results of blood tests to match your blood type. These changes can last for up to 6 months after the final dose. Your care team will do blood tests to match your blood type before you start treatment. Tell all of your care team that you are being treated with this medication before receiving a blood transfusion. This medication can affect the results of some tests used to determine treatment response; extra tests may be needed to evaluate response. Talk to your care team if you wish to become pregnant or think you are pregnant. This medication can cause serious birth defects if taken during pregnancy and for 3 months after the last dose. A reliable form of contraception is recommended while taking this medication and for 3 months after the last dose. Talk to your care team about effective forms of contraception. Do not breast-feed while taking this medication. What side effects may I notice from receiving this medication? Side effects that you should report to your care team as soon as possible: Allergic reactions--skin rash, itching, hives, swelling of the face, lips, tongue, or throat Infection--fever, chills, cough, sore throat, wounds that don't heal, pain or trouble when passing urine, general feeling of discomfort or being unwell Infusion reactions--chest pain, shortness of breath or trouble breathing, feeling faint or lightheaded Unusual bruising or bleeding Side effects that usually do not require medical attention (report to your care team if they continue or   are bothersome): Constipation Diarrhea Fatigue Nausea Pain, tingling, or numbness in the hands or feet Swelling of the ankles, hands, or feet This list may not describe all possible side effects. Call your doctor for medical  advice about side effects. You may report side effects to FDA at 1-800-FDA-1088. Where should I keep my medication? This medication is given in a hospital or clinic. It will not be stored at home. NOTE: This sheet is a summary. It may not cover all possible information. If you have questions about this medicine, talk to your doctor, pharmacist, or health care provider.  2024 Elsevier/Gold Standard (2022-01-31 00:00:00)  

## 2022-09-19 ENCOUNTER — Inpatient Hospital Stay: Payer: Medicare PPO

## 2022-09-19 ENCOUNTER — Other Ambulatory Visit: Payer: Self-pay | Admitting: Physician Assistant

## 2022-09-19 ENCOUNTER — Other Ambulatory Visit: Payer: Self-pay

## 2022-09-19 ENCOUNTER — Encounter: Payer: Medicare PPO | Admitting: Dietician

## 2022-09-19 ENCOUNTER — Inpatient Hospital Stay (HOSPITAL_BASED_OUTPATIENT_CLINIC_OR_DEPARTMENT_OTHER): Payer: Medicare PPO | Admitting: Nurse Practitioner

## 2022-09-19 ENCOUNTER — Inpatient Hospital Stay: Payer: Medicare PPO | Admitting: Dietician

## 2022-09-19 ENCOUNTER — Ambulatory Visit: Payer: Medicare PPO

## 2022-09-19 ENCOUNTER — Encounter: Payer: Self-pay | Admitting: Nurse Practitioner

## 2022-09-19 VITALS — BP 122/45 | HR 60 | Temp 98.3°F | Resp 16

## 2022-09-19 DIAGNOSIS — G893 Neoplasm related pain (acute) (chronic): Secondary | ICD-10-CM | POA: Diagnosis not present

## 2022-09-19 DIAGNOSIS — R531 Weakness: Secondary | ICD-10-CM | POA: Diagnosis not present

## 2022-09-19 DIAGNOSIS — L89154 Pressure ulcer of sacral region, stage 4: Secondary | ICD-10-CM | POA: Diagnosis not present

## 2022-09-19 DIAGNOSIS — C9000 Multiple myeloma not having achieved remission: Secondary | ICD-10-CM

## 2022-09-19 DIAGNOSIS — Z85238 Personal history of other malignant neoplasm of thymus: Secondary | ICD-10-CM | POA: Diagnosis not present

## 2022-09-19 DIAGNOSIS — Z515 Encounter for palliative care: Secondary | ICD-10-CM

## 2022-09-19 DIAGNOSIS — G4709 Other insomnia: Secondary | ICD-10-CM

## 2022-09-19 DIAGNOSIS — I1310 Hypertensive heart and chronic kidney disease without heart failure, with stage 1 through stage 4 chronic kidney disease, or unspecified chronic kidney disease: Secondary | ICD-10-CM | POA: Diagnosis not present

## 2022-09-19 DIAGNOSIS — L8990 Pressure ulcer of unspecified site, unspecified stage: Secondary | ICD-10-CM | POA: Diagnosis not present

## 2022-09-19 DIAGNOSIS — I350 Nonrheumatic aortic (valve) stenosis: Secondary | ICD-10-CM | POA: Diagnosis not present

## 2022-09-19 DIAGNOSIS — R530 Neoplastic (malignant) related fatigue: Secondary | ICD-10-CM | POA: Diagnosis not present

## 2022-09-19 DIAGNOSIS — Z5112 Encounter for antineoplastic immunotherapy: Secondary | ICD-10-CM | POA: Diagnosis not present

## 2022-09-19 DIAGNOSIS — E7800 Pure hypercholesterolemia, unspecified: Secondary | ICD-10-CM | POA: Diagnosis not present

## 2022-09-19 DIAGNOSIS — D649 Anemia, unspecified: Secondary | ICD-10-CM | POA: Diagnosis not present

## 2022-09-19 LAB — CMP (CANCER CENTER ONLY)
ALT: 167 U/L — ABNORMAL HIGH (ref 0–44)
AST: 40 U/L (ref 15–41)
Albumin: 2.2 g/dL — ABNORMAL LOW (ref 3.5–5.0)
Alkaline Phosphatase: 424 U/L — ABNORMAL HIGH (ref 38–126)
Anion gap: 3 — ABNORMAL LOW (ref 5–15)
BUN: 25 mg/dL — ABNORMAL HIGH (ref 8–23)
CO2: 29 mmol/L (ref 22–32)
Calcium: 7.7 mg/dL — ABNORMAL LOW (ref 8.9–10.3)
Chloride: 107 mmol/L (ref 98–111)
Creatinine: 0.56 mg/dL (ref 0.44–1.00)
GFR, Estimated: >60 mL/min (ref >60)
Glucose, Bld: 128 mg/dL — ABNORMAL HIGH (ref 70–99)
Potassium: 3.6 mmol/L (ref 3.5–5.1)
Sodium: 139 mmol/L (ref 135–145)
Total Bilirubin: 0.6 mg/dL (ref 0.3–1.2)
Total Protein: 5.4 g/dL — ABNORMAL LOW (ref 6.5–8.1)

## 2022-09-19 LAB — CBC WITH DIFFERENTIAL (CANCER CENTER ONLY)
Abs Immature Granulocytes: 0.11 10*3/uL — ABNORMAL HIGH (ref 0.00–0.07)
Basophils Absolute: 0 10*3/uL (ref 0.0–0.1)
Basophils Relative: 0 %
Eosinophils Absolute: 0.1 10*3/uL (ref 0.0–0.5)
Eosinophils Relative: 1 %
HCT: 24.1 % — ABNORMAL LOW (ref 36.0–46.0)
Hemoglobin: 8 g/dL — ABNORMAL LOW (ref 12.0–15.0)
Immature Granulocytes: 1 %
Lymphocytes Relative: 7 %
Lymphs Abs: 0.9 10*3/uL (ref 0.7–4.0)
MCH: 30.4 pg (ref 26.0–34.0)
MCHC: 33.2 g/dL (ref 30.0–36.0)
MCV: 91.6 fL (ref 80.0–100.0)
Monocytes Absolute: 1.2 10*3/uL — ABNORMAL HIGH (ref 0.1–1.0)
Monocytes Relative: 10 %
Neutro Abs: 10.4 10*3/uL — ABNORMAL HIGH (ref 1.7–7.7)
Neutrophils Relative %: 81 %
Platelet Count: 174 10*3/uL (ref 150–400)
RBC: 2.63 MIL/uL — ABNORMAL LOW (ref 3.87–5.11)
RDW: 18.9 % — ABNORMAL HIGH (ref 11.5–15.5)
WBC Count: 12.8 10*3/uL — ABNORMAL HIGH (ref 4.0–10.5)
nRBC: 0 % (ref 0.0–0.2)

## 2022-09-19 LAB — ABO/RH: ABO/RH(D): O POS

## 2022-09-19 LAB — HEPATITIS PANEL, ACUTE
HCV Ab: NONREACTIVE
Hep A IgM: NONREACTIVE
Hep B C IgM: NONREACTIVE
Hepatitis B Surface Ag: NONREACTIVE

## 2022-09-19 LAB — LACTATE DEHYDROGENASE: LDH: 215 U/L — ABNORMAL HIGH (ref 98–192)

## 2022-09-19 MED ORDER — DIPHENHYDRAMINE HCL 25 MG PO CAPS
50.0000 mg | ORAL_CAPSULE | Freq: Once | ORAL | Status: AC
  Administered 2022-09-19: 50 mg via ORAL
  Filled 2022-09-19: qty 2

## 2022-09-19 MED ORDER — MELATONIN 2.5 MG PO CHEW: 2.5000 mg | CHEWABLE_TABLET | Freq: Every evening | ORAL | 5 refills | Status: DC | PRN

## 2022-09-19 MED ORDER — DEXAMETHASONE 4 MG PO TABS
20.0000 mg | ORAL_TABLET | Freq: Once | ORAL | Status: AC
  Administered 2022-09-19: 20 mg via ORAL
  Filled 2022-09-19: qty 5

## 2022-09-19 MED ORDER — MONTELUKAST SODIUM 10 MG PO TABS
10.0000 mg | ORAL_TABLET | Freq: Once | ORAL | Status: AC
  Administered 2022-09-19: 10 mg via ORAL
  Filled 2022-09-19: qty 1

## 2022-09-19 MED ORDER — DARATUMUMAB-HYALURONIDASE-FIHJ 1800-30000 MG-UT/15ML ~~LOC~~ SOLN
1800.0000 mg | Freq: Once | SUBCUTANEOUS | Status: AC
  Administered 2022-09-19: 1800 mg via SUBCUTANEOUS
  Filled 2022-09-19: qty 15

## 2022-09-19 MED ORDER — ACETAMINOPHEN 325 MG PO TABS
650.0000 mg | ORAL_TABLET | Freq: Once | ORAL | Status: AC
  Administered 2022-09-19: 650 mg via ORAL
  Filled 2022-09-19: qty 2

## 2022-09-19 NOTE — Patient Instructions (Signed)
[facility]  Discharge Instructions: Thank you for choosing [facility] to provide your oncology and hematology care.   If you have a lab appointment with the Cancer Center, please go directly to the Cancer Center and check in at the registration area.   Wear comfortable clothing and clothing appropriate for easy access to any Portacath or PICC line.   We strive to give you quality time with your provider. You may need to reschedule your appointment if you arrive late (15 or more minutes).  Arriving late affects you and other patients whose appointments are after yours.  Also, if you miss three or more appointments without notifying the office, you may be dismissed from the clinic at the provider's discretion.      For prescription refill requests, have your pharmacy contact our office and allow 72 hours for refills to be completed.    Today you received the following chemotherapy and/or immunotherapy agents Darzalex Faspro      To help prevent nausea and vomiting after your treatment, we encourage you to take your nausea medication as directed.  BELOW ARE SYMPTOMS THAT SHOULD BE REPORTED IMMEDIATELY: *FEVER GREATER THAN 100.4 F (38 C) OR HIGHER *CHILLS OR SWEATING *NAUSEA AND VOMITING THAT IS NOT CONTROLLED WITH YOUR NAUSEA MEDICATION *UNUSUAL SHORTNESS OF BREATH *UNUSUAL BRUISING OR BLEEDING *URINARY PROBLEMS (pain or burning when urinating, or frequent urination) *BOWEL PROBLEMS (unusual diarrhea, constipation, pain near the anus) TENDERNESS IN MOUTH AND THROAT WITH OR WITHOUT PRESENCE OF ULCERS (sore throat, sores in mouth, or a toothache) UNUSUAL RASH, SWELLING OR PAIN  UNUSUAL VAGINAL DISCHARGE OR ITCHING   Items with * indicate a potential emergency and should be followed up as soon as possible or go to the Emergency Department if any problems should occur.  Please show the CHEMOTHERAPY ALERT CARD or IMMUNOTHERAPY ALERT CARD at  check-in to the Emergency Department and triage nurse.  Should you have questions after your visit or need to cancel or reschedule your appointment, please contact [facility]  Dept: [phone]  and follow the prompts.  Office hours are 8:00 a.m. to 4:30 p.m. Monday - Friday. Please note that voicemails left after 4:00 p.m. may not be returned until the following business day.  We are closed weekends and major holidays. You have access to a nurse at all times for urgent questions. Please call the main number to the clinic Dept: [phone] and follow the prompts.   For any non-urgent questions, you may also contact your provider using MyChart. We now offer e-Visits for anyone 18 and older to request care online for non-urgent symptoms. For details visit [email or web address].   Also download the MyChart app! Go to the app store, search "MyChart", open the app, select , and log in with your MyChart username and password.  

## 2022-09-19 NOTE — Progress Notes (Signed)
Pt observed for 60 minutes post second Darzalex Faspro injection. Pt tolerated trtmt well w/out incident. VSS at discharge.  Pt discharged via [facility].

## 2022-09-19 NOTE — Progress Notes (Unsigned)
Met with [name] in her bedroom in the infusion room this am to review chemotherapy education. Son [name] not present. Gave an overview of side effects, gave drug information eating hints,( Pt has seen dietitian and Symptom management for pain control) Pt is a nursing facility. Pt not interested in support groups.Reviewed information in packet, however, [name] did not want to listen to any more information in the middle of the conversation. Honored her request. Left packet for patient and son came into the room and reviewed content of packet with [name]

## 2022-09-20 ENCOUNTER — Ambulatory Visit: Payer: Medicare PPO | Admitting: Nurse Practitioner

## 2022-09-24 ENCOUNTER — Telehealth: Payer: Self-pay | Admitting: Medical Oncology

## 2022-09-24 ENCOUNTER — Encounter: Payer: Self-pay | Admitting: Internal Medicine

## 2022-09-24 NOTE — Telephone Encounter (Signed)
Mental status -is good . Are there any mental side effects from myeloma? Yesterday she was back to her normal self. She was very coherent.Went for a car ride. [name] will  confirm transportation with [facility] for tomorrow -Doctor, general practice or wheelchair.Marland Kitchen

## 2022-09-24 NOTE — Progress Notes (Signed)
Called pt to introduce myself as her Dance movement psychotherapist and to discuss the [employer]. I wasn't able to leave a msg because her mailbox is full so I will try and speak with her when she comes for her next appt.

## 2022-09-24 NOTE — Progress Notes (Unsigned)
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 Dorris Fetch on January 28, 2022.   CURRENT THERAPY: 1) Starting systemic therapy with Revlimid 21 days on 7 days off, Darzalex, and 20 mg of Decadron weekly.  First dose expected next week on 09/11/2022. 2) Monthly Zometa injections for 6 to 12 months, followed by every 3 months thereon after once dental clearance is obtained.  INTERVAL HISTORY: [name] 82 y.o. female returns to the clinic today for a follow-up visit accompanied by her son.  The patient was recently diagnosed with multiple myeloma and she was quite symptomatic at her last appointment.  When she was last seen in the clinic on 09/11/2022, she started her first cycle of treatment.  She overall tolerated well without any concerning adverse side effects that she did have a little bit of oral thrush for which it sounds like she was given nystatin mouth rinse at the skilled nursing facility.. She also has confusion at times prior to starting treatment, she was hospitalized for altered mental status and AKI.  She is currently a resident at [facility] skilled nursing facility.  She started her Revlimid.   The patient is feeling fair today.  She is finally able to sit up in a chair.  She has a bedsore that is healing.  Her wound doctor had looked at it last week and said it was healing well.  She is on antibiotics though at this time that is precautionary per patient's son report.  She reports stable fatigue.  She does have some weakness for which she is undergoing physical therapy. She does  have periods of intermittent confusion.  Her son states that on Monday and Tuesday of this week it was like a fog had lifted and she was back to her baseline cognitive self.  Today she is having a little bit more of the mental fog than she had been having the last 2 days where she had been for the last month or so.  Today she denies any fever, chills, or night sweats.  She denies any signs and symptoms of infection including nasal congestion, sore throat, dysuria, diarrhea, abdominal pain, or upper respiratory infections.  She denies any abnormal bleeding or bruising. She is compliant with her Decadron.she has been taking 2 mg.  She is also established with palliative care.  She takes Tylenol 650 mg every 8 hours.  He is prescribed oxycodone but has not been taking this.  She does take her melatonin for insomnia.  She is here today for evaluation repeat blood work before undergoing day 15 cycle 1.  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 by echo 10/2021   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/2015   Carotid artery stenosis    < 50% stenosis bilaterally by doppler  07/2016   Coronary artery disease 2008   Coronary Ca score of 331 with minimal multivessel plaque < 25% stenosis by coronary CTA 8/23   Heart murmur    per pt   Hypercholesteremia    Hypertension    Obesity    Osteopenia    declines treatment   Pneumonia 1995   Pulmonary HTN (HCC)    mild to moderate by echo 7/23 with PASP   Shoulder pain    Due to arthritis   Vitamin D insufficiency     ALLERGIES:  is allergic to crestor [rosuvastatin calcium], lactose, lipitor [atorvastatin], pravastatin, simvastatin, tramadol, zetia [ezetimibe], codeine, penicillins, sulfa antibiotics,  and vancomycin.  MEDICATIONS:  Current Outpatient Medications  Medication Sig Dispense Refill   potassium chloride SA (KLOR-CON M) 20 MEQ tablet Take 1 tablet (20 mEq total) by mouth daily. 6 tablet 0   acetaminophen (TYLENOL) 325 MG tablet Take 2 tablets (650 mg total) by mouth every 8 (eight) hours as needed.     acyclovir (ZOVIRAX) 200 MG capsule Take 200 mg by mouth 2 (two) times daily.     allopurinol (ZYLOPRIM) 100 MG tablet Take 1 tablet (100 mg total) by mouth 2 (two) times daily. 60 tablet 2   amLODipine (NORVASC) 10 MG tablet Take 1 tablet (10 mg total) by mouth daily.     ascorbic acid (VITAMIN C) 500 MG tablet Take 1 tablet (500 mg total) by mouth daily.     daratumumab-hyaluronidase-fihj (DARZALEX FASPRO) 1800-30000 MG-UT/15ML SOLN Inject 1,800 mg into the skin once.     dexamethasone (DECADRON) 2 MG tablet Take 1 tablet (2 mg total) by mouth daily.     dexamethasone (DECADRON) 4 MG tablet Take 20 mg by mouth once a week. For multiple myeloma as part of her treatment (Patient not taking: Reported on 09/11/2022)     H-CHLOR 12 0.125 % SOLN      HYDROcodone-acetaminophen (NORCO/VICODIN) 5-325 MG tablet Take 1 tablet by mouth daily as needed for severe pain.     lenalidomide (REVLIMID) 15 MG capsule Take 1 capsule (15 mg total) by mouth daily. Take for 21 days, then hold for 7 days. Repeat every 28 days. (Patient not taking: Reported on 08/31/2022) 21 capsule 0   lidocaine (LIDODERM) 5 % Place 1 patch onto the skin daily. Remove & Discard patch within 12 hours or as directed by MD 30 patch 0   Melatonin 2.5 MG CHEW Chew 2.5-5 mg by mouth at bedtime as needed. For sleep 60 tablet 5   Multiple Vitamin (MULTIVITAMIN WITH MINERALS) TABS tablet Take 1 tablet by mouth daily.     multivitamin (RENA-VIT) TABS tablet Take 1 tablet by mouth at bedtime.  0   naloxone (NARCAN) nasal spray 4 mg/0.1 mL SMARTSIG:1 Both Nares Daily (Patient not taking: Reported on 09/11/2022)     Nutritional Supplements  (FEEDING SUPPLEMENT, NEPRO CARB STEADY,) LIQD Take 237 mLs by mouth 2 (two) times daily between meals.  0   pantoprazole (PROTONIX) 40 MG tablet Take 1 tablet (40 mg total) by mouth 2 (two) times daily. 30 tablet 0   prochlorperazine (COMPAZINE) 10 MG tablet Take 1 tablet (10 mg total) by mouth every 6 (six) hours as needed for nausea or vomiting. (Patient not taking: Reported on 09/11/2022) 30 tablet 0   sucralfate (CARAFATE) 1 GM/10ML suspension Take 10 mLs (1 g total) by mouth 4 (four) times daily -  with meals and at bedtime. 420 mL 0   No current facility-administered  medications for this visit.    SURGICAL HISTORY:  Past Surgical History:  Procedure Laterality Date   AORTIC VALVE REPLACEMENT N/A 10/14/2013   Procedure: AORTIC VALVE REPLACEMENT (AVR);  Surgeon: Alleen Borne, MD;  Location: [facility] OR;  Service: Open Heart Surgery;  Laterality: N/A;   CARDIAC CATHETERIZATION     CATARACT EXTRACTION, BILATERAL     CHOLECYSTECTOMY  04/08/1988   ESOPHAGOGASTRODUODENOSCOPY (EGD) WITH PROPOFOL N/A 09/05/2022   Procedure: ESOPHAGOGASTRODUODENOSCOPY (EGD) WITH PROPOFOL;  Surgeon: Kathi Der, MD;  Location: [facility] ENDOSCOPY;  Service: Gastroenterology;  Laterality: N/A;   INTRAOPERATIVE TRANSESOPHAGEAL ECHOCARDIOGRAM N/A 10/14/2013   Procedure: INTRAOPERATIVE TRANSESOPHAGEAL ECHOCARDIOGRAM;  Surgeon: Alleen Borne, MD;  Location: [facility] OR;  Service: Open Heart Surgery;  Laterality: N/A;   IVC FILTER INSERTION N/A 09/06/2022   Procedure: IVC FILTER INSERTION;  Surgeon: Leonie Douglas, MD;  Location: [facility] INVASIVE CV LAB;  Service: Cardiovascular;  Laterality: N/A;   KYPHOPLASTY Bilateral 03/27/2022   Procedure: KYPHOPLASTY AND BIOPSY THORACIC ELEVEN;  Surgeon: Lisbeth Renshaw, MD;  Location: [facility] OR;  Service: Neurosurgery;  Laterality: Bilateral;   LEFT AND RIGHT HEART CATHETERIZATION WITH CORONARY ANGIOGRAM N/A 09/16/2013   Procedure: LEFT AND RIGHT HEART CATHETERIZATION WITH CORONARY ANGIOGRAM;   Surgeon: Quintella Reichert, MD;  Location: [facility] CATH LAB;  Service: Cardiovascular;  Laterality: N/A;   TONSILLECTOMY      REVIEW OF SYSTEMS:   Review of Systems  Constitutional: Positive for stable fatigue. Negative for appetite change, chills,  fever and unexpected weight change.  HENT: Negative for mouth sores, nosebleeds, sore throat and trouble swallowing.   Eyes: Negative for eye problems and icterus.  Respiratory: Negative for cough, hemoptysis, shortness of breath and wheezing.   Cardiovascular: Negative for chest pain and leg swelling.  Gastrointestinal: Negative for abdominal pain, constipation, diarrhea, nausea and vomiting.  Genitourinary: Negative for bladder incontinence, difficulty urinating, dysuria, frequency and hematuria.   Musculoskeletal: Positive for some bone pain secondary to her multiple myeloma.  Negative for gait problem, neck pain and neck stiffness.  Skin: Positive for decubitus ulcer. Neurological: Negative for dizziness, extremity weakness, gait problem, headaches, light-headedness and seizures.  Hematological: Negative for adenopathy. Does not bruise/bleed easily.  Psychiatric/Behavioral: Negative for confusion, depression and sleep disturbance. The patient is not nervous/anxious.     PHYSICAL EXAMINATION:  Blood pressure (!) 141/53, pulse 60, temperature 97.9 F (36.6 C), resp. rate 13, weight 140 lb 1.6 oz (63.5 kg), SpO2 100 %.  ECOG PERFORMANCE STATUS: 2-3  Physical Exam  Constitutional: Oriented to person, place, and time and well-developed, well-nourished, and in no distress.  HENT:  Head: Normocephalic and atraumatic.  Mouth/Throat: Oropharynx is clear and moist. No oropharyngeal exudate.  Eyes: Conjunctivae are normal. Right eye exhibits no discharge. Left eye exhibits no discharge. No scleral icterus.  Neck: Normal range of motion. Neck supple.  Cardiovascular: Normal rate, regular rhythm, normal heart sounds and intact distal pulses.    Pulmonary/Chest: Effort normal and breath sounds normal. No respiratory distress. No wheezes. No rales.  Abdominal: Soft. Bowel sounds are normal. Exhibits no distension and no mass. There is no tenderness.  Musculoskeletal: Normal range of motion. Exhibits no edema.  Lymphadenopathy:    No cervical adenopathy.  Neurological: Alert and oriented to person, place, and time. Exhibits slow wasting.  She was examined in the wheelchair.  Skin: Skin is warm and dry. No rash noted. Not diaphoretic. No erythema. No pallor.  Psychiatric: Mood, memory and judgment normal.  Vitals reviewed.  LABORATORY DATA:  Lab Results  Component Value Date   WBC 6.3 09/25/2022   HGB 8.6 (L) 09/25/2022   HCT 27.1 (L) 09/25/2022   MCV 97.1 09/25/2022   PLT 143 (L) 09/25/2022      Chemistry      Component Value Date/Time   NA 144 09/25/2022 0851   NA 141 10/06/2020 0939   K 3.2 (L) 09/25/2022 0851   CL 108 09/25/2022 0851   CO2 30 09/25/2022 0851   BUN 22 09/25/2022 0851   BUN 17 10/06/2020 0939   CREATININE 0.53 09/25/2022 0851   CREATININE 0.85 01/29/2016 0910      Component Value Date/Time   CALCIUM 8.1 (L) 09/25/2022 0851   ALKPHOS 662 (H) 09/25/2022 0851   AST 38 09/25/2022 0851   ALT 123 (H) 09/25/2022 0851   BILITOT 0.5 09/25/2022 0851       RADIOGRAPHIC STUDIES:  PERIPHERAL VASCULAR CATHETERIZATION  Result Date: 09/06/2022 DATE OF SERVICE: 09/06/2022  PATIENT:  [name]  82 y.o. female  PRE-OPERATIVE DIAGNOSIS:  DVT, relative contraindication to anticoagulation  POST-OPERATIVE DIAGNOSIS:  Same  PROCEDURE:  Ultrasound guided right common femoral vein placement IVC filter placement Conscious sedation (10 minutes)  SURGEON:  Surgeon(s) and Role:    * Leonie Douglas, MD - Primary  ASSISTANT: none  ANESTHESIA:   local and IV sedation  EBL: minimal  BLOOD ADMINISTERED:none  DRAINS: none  LOCAL MEDICATIONS USED:  LIDOCAINE  SPECIMEN:  none  COUNTS: confirmed correct.  TOURNIQUET:  none   PATIENT DISPOSITION:  PACU - hemodynamically stable.  Delay start of Pharmacological VTE agent (>24hrs) due to surgical blood loss or risk of bleeding: no  INDICATION FOR PROCEDURE: [name] is a 82 y.o. female with DVT, relative contraindication to anticoagulation. After careful discussion of risks, benefits, and alternatives the patient was offered IVC filter placement. The patient understood and wished to proceed.  OPERATIVE FINDINGS: successful IVC filter placement  DESCRIPTION OF PROCEDURE: After identification of the patient in the pre-operative holding area, the patient was transferred to the operating room. The patient was positioned supine on the operating room table. Anesthesia was induced. The groins were prepped and draped in standard fashion. A surgical pause was performed confirming correct patient, procedure, and operative location.  Ultrasound guidance was used to obtain micropuncture access in the right common femoral vein.  Through the micro sheath, Bentson wire was navigated into the IVC.  The tract was dilated.  A Cook select IVC filter system was prepared per [employer] instructions and brought onto the table.  The sheath and flush catheter were then advanced into the infrarenal IVC.  (Venogram was performed.  We marked the renal veins.  An IVC filter was deployed under the renal veins.  This was uncomplicated.  A follow-up venogram was performed showing satisfactory IVC filter placement.  All endovascular equipment was removed.  Manual pressure was held on the vena puncture access site with good result.  Upon completion of the case instrument and sharps counts were confirmed correct. The patient was transferred to the PACU in good condition. I was present for all portions of the procedure.  FOLLOW UP PLAN: I will see the patient again in 1 to 2 months to schedule IVC filter retrieval.  Please call for questions in the interim.  Rande Brunt. Lenell Antu, MD FACS [facility] Phone Number: [phone] 09/06/2022 2:27 PM    VAS [place] LOWER EXTREMITY VENOUS (DVT)  Result Date: 08/31/2022  Lower Venous DVT Study Patient Name:  [name]  Date of Exam:   08/31/2022 Medical Rec #: [id number]     Accession #:    [id number] Date of Birth: [birth year]     Patient Gender: F Patient Age:   64 years Exam Location:  [facility] Procedure:      VAS [place] LOWER EXTREMITY VENOUS (DVT) Referring Phys: Alwyn Ren GONFA --------------------------------------------------------------------------------  Indications: Bilateral leg swelling.  Risk Factors: Cancer - multiple myeloma. Comparison Study: No prior studies. Performing Technologist: Jean Rosenthal RDMS, RVT  Examination Guidelines: A complete evaluation includes B-mode imaging, spectral Doppler, color Doppler, and power Doppler as needed of all accessible portions of each vessel. Bilateral testing is considered an integral part of a complete examination. Limited examinations for reoccurring indications may be performed as noted. The reflux portion of the exam is performed with the patient in reverse Trendelenburg.  +---------+---------------+---------+-----------+----------+--------------+ RIGHT    CompressibilityPhasicitySpontaneityPropertiesThrombus Aging +---------+---------------+---------+-----------+----------+--------------+ CFV      Full           Yes      Yes                                 +---------+---------------+---------+-----------+----------+--------------+ SFJ      Full                                                        +---------+---------------+---------+-----------+----------+--------------+ FV Prox  Full                                                        +---------+---------------+---------+-----------+----------+--------------+ FV Mid   Full                                                         +---------+---------------+---------+-----------+----------+--------------+ FV DistalFull                                                        +---------+---------------+---------+-----------+----------+--------------+ PFV      Full                                                        +---------+---------------+---------+-----------+----------+--------------+ POP      Full           Yes      Yes                                 +---------+---------------+---------+-----------+----------+--------------+ PTV      Full                                                        +---------+---------------+---------+-----------+----------+--------------+  PERO     Full                                                        +---------+---------------+---------+-----------+----------+--------------+   +---------+---------------+---------+-----------+----------+--------------+ LEFT     CompressibilityPhasicitySpontaneityPropertiesThrombus Aging +---------+---------------+---------+-----------+----------+--------------+ CFV      Full           Yes      Yes                                 +---------+---------------+---------+-----------+----------+--------------+ SFJ      Full                                                        +---------+---------------+---------+-----------+----------+--------------+ FV Prox  Full                                                        +---------+---------------+---------+-----------+----------+--------------+ FV Mid   Full                                                        +---------+---------------+---------+-----------+----------+--------------+ FV DistalFull                                                        +---------+---------------+---------+-----------+----------+--------------+ PFV      Full                                                         +---------+---------------+---------+-----------+----------+--------------+ POP      Full                                                        +---------+---------------+---------+-----------+----------+--------------+ PTV      Partial        Yes      Yes                  Acute          +---------+---------------+---------+-----------+----------+--------------+ PERO     None           No       No                   Acute          +---------+---------------+---------+-----------+----------+--------------+  Summary: RIGHT: - There is no evidence of deep vein thrombosis in the lower extremity.  - No cystic structure found in the popliteal fossa.  LEFT: - Findings consistent with acute deep vein thrombosis involving the left posterior tibial veins, and left peroneal veins. - A cystic structure is found in the popliteal fossa.  *See table(s) above for measurements and observations. Electronically signed by Waverly Ferrari MD on 08/31/2022 at 7:59:15 PM.    Final    DG Pelvis 1-2 Views  Result Date: 08/31/2022 CLINICAL DATA:  Sacral wound.  History of multiple myeloma. EXAM: PELVIS - 1-2 VIEW COMPARISON:  PET-CT 08/22/2022 FINDINGS: No acute fracture or pelvic diastasis is identified. The lower sacrum and coccyx are poorly evaluated due to overlying stool. Degenerative changes are noted in the lower lumbar spine, and there are mild degenerative changes at the hips. Small lucencies consistent with known multiple myeloma are most conspicuous in the pubic rami. IMPRESSION: 1. No acute osseous abnormality identified. 2. Known multiple myeloma. Electronically Signed   By: Sebastian Ache M.D.   On: 08/31/2022 11:26   US RENAL  Result Date: 08/31/2022 CLINICAL DATA:  Acute kidney injury EXAM: RENAL / URINARY TRACT ULTRASOUND COMPLETE COMPARISON:  07/30/2022 FINDINGS: Right Kidney: Renal measurements: 10.5 x 4.7 x 5 cm = volume: 130 mL. Echogenicity within normal limits. No mass or hydronephrosis  visualized. Left Kidney: Renal measurements: 10.2 x 6 x 5.2 cm = volume: 170 mL. Echogenicity within normal limits. No mass or hydronephrosis visualized. Bladder: Appears normal for degree of bladder distention. Other: Echogenic liver consistent with steatosis. 3.5 cm pelvic cyst which is left-sided, ovarian, and underestimated when compared to recent PET CT at which time recommendations were provided for follow-up. IMPRESSION: Normal ultrasound of the kidneys and bladder. Electronically Signed   By: Tiburcio Pea M.D.   On: 08/31/2022 06:33   CT Head Wo Contrast  Result Date: 08/31/2022 CLINICAL DATA:  Altered mental status. EXAM: CT HEAD WITHOUT CONTRAST TECHNIQUE: Contiguous axial images were obtained from the base of the skull through the vertex without intravenous contrast. RADIATION DOSE REDUCTION: This exam was performed according to the departmental dose-optimization program which includes automated exposure control, adjustment of the mA and/or kV according to patient size and/or use of iterative reconstruction technique. COMPARISON:  May 15, 2020 FINDINGS: Brain: There is mild cerebral atrophy with widening of the extra-axial spaces and ventricular dilatation. There are areas of decreased attenuation within the white matter tracts of the supratentorial brain, consistent with microvascular disease changes. Vascular: No hyperdense vessel or unexpected calcification. Skull: Innumerable small lytic lesions are seen scattered throughout the skull. This represents a new finding when compared to the prior study. Sinuses/Orbits: No acute finding. Other: None. IMPRESSION: 1. No acute intracranial abnormality. 2. 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 3. Generalized cerebral atrophy with widening of the extra-axial spaces and ventricular dilatation. 4. 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 Electronically Signed   By: Aram Candela M.D.   On: 08/31/2022 01:22   DG Chest 2 View  Result Date: 08/31/2022 CLINICAL DATA:  Chest pain. EXAM: CHEST - 2 VIEW COMPARISON:  July 30, 2022 FINDINGS: Multiple sternal wires are noted. The cardiac silhouette is borderline in size and stable in appearance. An artificial aortic valve is seen. Mild to moderate severity diffuse, chronic appearing increased interstitial lung markings are present. Mild atelectasis is seen within the left lung base. There is no  evidence of a pleural effusion or pneumothorax. Multilevel degenerative changes are seen throughout the thoracic spine. IMPRESSION: 1. 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 2. Chronic appearing increased interstitial lung markings with mild left basilar atelectasis. Electronically Signed   By: Aram Candela M.D.   On: 08/31/2022 00:23     ASSESSMENT/PLAN:  This is a very pleasant 81 year old Caucasian female with  1) history of stage I (T1a, N0, M0) thymoma type AB diagnosed in October 2023 status post resection under the care of Dr. Dorris Fetch on January 28, 2022 with close resection margin and microscopic infiltration of the capsule. 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    She is currently undergoing treatment with systemic therapy with daratumumab, Revlimid 15 mg p.o. daily for 21 days every 4 weeks in addition to Decadron 20 mg weekly with the treatment.   She is here for day 15 cycle #1 today. Thus far, she has tolerated this well except for thrush for which she has nystatin rinses at home.  She has little residual thrush today and I recommended she resume her nystatin rinses.  The patient was seen with Dr. Arbutus Ped, labs were reviewed, recommend he proceed with day 15 cycle 1 today's schedule.  Her potassium is slightly low at 3.2.  I printed out a prescription to take 1 tablet of 20 mill equivalents of potassium daily for 6  days.  He will continue to follow with wound care regarding her decubitus ulcer.  We will see her back for a follow up visit in 2 weeks for evaluation and repeat blood work before starting cycle #2  She will continue taking melatonin for insomnia.   The patient was advised to call immediately if she has any concerning symptoms in the interval. The patient voices understanding of current disease status and treatment options and is in agreement with the current care plan. All questions were answered. The patient knows to call the clinic with any problems, questions or concerns. We can certainly see the patient much sooner if necessary  No orders of the defined types were placed in this encounter.    Aidenjames Heckmann L Elyzabeth Goatley, PA-C 09/25/22  ADDENDUM: Hematology/Oncology Attending: I had a face-to-face encounter with the patient today.  I reviewed her records, lab and recommended her care plan.  This is a very pleasant 82 years old white female recently diagnosed with a stage II multiple myeloma, IgG subtype diagnosed in May 2024.  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 She is currently undergoing systemic treatment with daratumumab, Revlimid and Decadron started the first dose on 09/11/2022.  The patient is tolerating her treatment well and she started feeling a little bit better after the first 2 weeks of her treatment.  She continues to have significant fatigue and weakness. I recommended for her to continue her current treatment with the same regimen. She will also continue on Zometa injection for the bone disease. The patient will come back for follow-up visit in 2 weeks for evaluation and management of any adverse effect of her treatment. She was advised to call immediately if she has any other concerning symptoms in the interval. The total time spent in the appointment was 20 minutes. Disclaimer: This note was dictated with voice  recognition software. Similar sounding words can inadvertently be transcribed and may be missed upon review. Lajuana Matte, MD

## 2022-09-25 ENCOUNTER — Inpatient Hospital Stay: Payer: Medicare PPO

## 2022-09-25 ENCOUNTER — Other Ambulatory Visit: Payer: Self-pay

## 2022-09-25 ENCOUNTER — Inpatient Hospital Stay: Payer: Medicare PPO | Admitting: Dietician

## 2022-09-25 ENCOUNTER — Inpatient Hospital Stay (HOSPITAL_BASED_OUTPATIENT_CLINIC_OR_DEPARTMENT_OTHER): Payer: Medicare PPO | Admitting: Physician Assistant

## 2022-09-25 VITALS — BP 141/53 | HR 60 | Temp 97.9°F | Resp 13 | Wt 140.1 lb

## 2022-09-25 DIAGNOSIS — G893 Neoplasm related pain (acute) (chronic): Secondary | ICD-10-CM | POA: Diagnosis not present

## 2022-09-25 DIAGNOSIS — I1310 Hypertensive heart and chronic kidney disease without heart failure, with stage 1 through stage 4 chronic kidney disease, or unspecified chronic kidney disease: Secondary | ICD-10-CM | POA: Diagnosis not present

## 2022-09-25 DIAGNOSIS — E876 Hypokalemia: Secondary | ICD-10-CM | POA: Diagnosis not present

## 2022-09-25 DIAGNOSIS — Z5112 Encounter for antineoplastic immunotherapy: Secondary | ICD-10-CM | POA: Diagnosis not present

## 2022-09-25 DIAGNOSIS — D649 Anemia, unspecified: Secondary | ICD-10-CM | POA: Diagnosis not present

## 2022-09-25 DIAGNOSIS — Z85238 Personal history of other malignant neoplasm of thymus: Secondary | ICD-10-CM | POA: Diagnosis not present

## 2022-09-25 DIAGNOSIS — I350 Nonrheumatic aortic (valve) stenosis: Secondary | ICD-10-CM | POA: Diagnosis not present

## 2022-09-25 DIAGNOSIS — C9000 Multiple myeloma not having achieved remission: Secondary | ICD-10-CM

## 2022-09-25 DIAGNOSIS — E7800 Pure hypercholesterolemia, unspecified: Secondary | ICD-10-CM | POA: Diagnosis not present

## 2022-09-25 DIAGNOSIS — L8990 Pressure ulcer of unspecified site, unspecified stage: Secondary | ICD-10-CM | POA: Diagnosis not present

## 2022-09-25 LAB — CBC WITH DIFFERENTIAL (CANCER CENTER ONLY)
Abs Immature Granulocytes: 0.03 10*3/uL (ref 0.00–0.07)
Basophils Absolute: 0 10*3/uL (ref 0.0–0.1)
Basophils Relative: 0 %
Eosinophils Absolute: 0.2 10*3/uL (ref 0.0–0.5)
Eosinophils Relative: 3 %
HCT: 27.1 % — ABNORMAL LOW (ref 36.0–46.0)
Hemoglobin: 8.6 g/dL — ABNORMAL LOW (ref 12.0–15.0)
Immature Granulocytes: 1 %
Lymphocytes Relative: 31 %
Lymphs Abs: 1.9 10*3/uL (ref 0.7–4.0)
MCH: 30.8 pg (ref 26.0–34.0)
MCHC: 31.7 g/dL (ref 30.0–36.0)
MCV: 97.1 fL (ref 80.0–100.0)
Monocytes Absolute: 0.5 10*3/uL (ref 0.1–1.0)
Monocytes Relative: 7 %
Neutro Abs: 3.7 10*3/uL (ref 1.7–7.7)
Neutrophils Relative %: 58 %
Platelet Count: 143 10*3/uL — ABNORMAL LOW (ref 150–400)
RBC: 2.79 MIL/uL — ABNORMAL LOW (ref 3.87–5.11)
RDW: 20.8 % — ABNORMAL HIGH (ref 11.5–15.5)
WBC Count: 6.3 10*3/uL (ref 4.0–10.5)
nRBC: 0 % (ref 0.0–0.2)

## 2022-09-25 LAB — CMP (CANCER CENTER ONLY)
ALT: 123 U/L — ABNORMAL HIGH (ref 0–44)
AST: 38 U/L (ref 15–41)
Albumin: 2.6 g/dL — ABNORMAL LOW (ref 3.5–5.0)
Alkaline Phosphatase: 662 U/L — ABNORMAL HIGH (ref 38–126)
Anion gap: 6 (ref 5–15)
BUN: 22 mg/dL (ref 8–23)
CO2: 30 mmol/L (ref 22–32)
Calcium: 8.1 mg/dL — ABNORMAL LOW (ref 8.9–10.3)
Chloride: 108 mmol/L (ref 98–111)
Creatinine: 0.53 mg/dL (ref 0.44–1.00)
GFR, Estimated: >60 mL/min (ref >60)
Glucose, Bld: 132 mg/dL — ABNORMAL HIGH (ref 70–99)
Potassium: 3.2 mmol/L — ABNORMAL LOW (ref 3.5–5.1)
Sodium: 144 mmol/L (ref 135–145)
Total Bilirubin: 0.5 mg/dL (ref 0.3–1.2)
Total Protein: 5.1 g/dL — ABNORMAL LOW (ref 6.5–8.1)

## 2022-09-25 LAB — LACTATE DEHYDROGENASE: LDH: 226 U/L — ABNORMAL HIGH (ref 98–192)

## 2022-09-25 LAB — SAMPLE TO BLOOD BANK

## 2022-09-25 MED ORDER — DIPHENHYDRAMINE HCL 25 MG PO CAPS
50.0000 mg | ORAL_CAPSULE | Freq: Once | ORAL | Status: AC
  Administered 2022-09-25: 50 mg via ORAL
  Filled 2022-09-25: qty 2

## 2022-09-25 MED ORDER — DEXAMETHASONE 4 MG PO TABS
20.0000 mg | ORAL_TABLET | Freq: Once | ORAL | Status: AC
  Administered 2022-09-25: 20 mg via ORAL
  Filled 2022-09-25: qty 5

## 2022-09-25 MED ORDER — DARATUMUMAB-HYALURONIDASE-FIHJ 1800-30000 MG-UT/15ML ~~LOC~~ SOLN
1800.0000 mg | Freq: Once | SUBCUTANEOUS | Status: AC
  Administered 2022-09-25: 1800 mg via SUBCUTANEOUS
  Filled 2022-09-25: qty 15

## 2022-09-25 MED ORDER — MONTELUKAST SODIUM 10 MG PO TABS
10.0000 mg | ORAL_TABLET | Freq: Once | ORAL | Status: AC
  Administered 2022-09-25: 10 mg via ORAL
  Filled 2022-09-25: qty 1

## 2022-09-25 MED ORDER — ACETAMINOPHEN 325 MG PO TABS
650.0000 mg | ORAL_TABLET | Freq: Once | ORAL | Status: AC
  Administered 2022-09-25: 650 mg via ORAL
  Filled 2022-09-25: qty 2

## 2022-09-25 MED ORDER — POTASSIUM CHLORIDE CRYS ER 20 MEQ PO TBCR
20.0000 meq | EXTENDED_RELEASE_TABLET | Freq: Every day | ORAL | 0 refills | Status: DC
Start: 2022-09-25 — End: 2022-10-24

## 2022-09-25 NOTE — Patient Instructions (Signed)
[facility]  Discharge Instructions: Thank you for choosing [facility] to provide your oncology and hematology care.   If you have a lab appointment with the Cancer Center, please go directly to the Cancer Center and check in at the registration area.   Wear comfortable clothing and clothing appropriate for easy access to any Portacath or PICC line.   We strive to give you quality time with your provider. You may need to reschedule your appointment if you arrive late (15 or more minutes).  Arriving late affects you and other patients whose appointments are after yours.  Also, if you miss three or more appointments without notifying the office, you may be dismissed from the clinic at the provider's discretion.      For prescription refill requests, have your pharmacy contact our office and allow 72 hours for refills to be completed.    Today you received the following chemotherapy and/or immunotherapy agents Darzalex Faspro      To help prevent nausea and vomiting after your treatment, we encourage you to take your nausea medication as directed.  BELOW ARE SYMPTOMS THAT SHOULD BE REPORTED IMMEDIATELY: *FEVER GREATER THAN 100.4 F (38 C) OR HIGHER *CHILLS OR SWEATING *NAUSEA AND VOMITING THAT IS NOT CONTROLLED WITH YOUR NAUSEA MEDICATION *UNUSUAL SHORTNESS OF BREATH *UNUSUAL BRUISING OR BLEEDING *URINARY PROBLEMS (pain or burning when urinating, or frequent urination) *BOWEL PROBLEMS (unusual diarrhea, constipation, pain near the anus) TENDERNESS IN MOUTH AND THROAT WITH OR WITHOUT PRESENCE OF ULCERS (sore throat, sores in mouth, or a toothache) UNUSUAL RASH, SWELLING OR PAIN  UNUSUAL VAGINAL DISCHARGE OR ITCHING   Items with * indicate a potential emergency and should be followed up as soon as possible or go to the Emergency Department if any problems should occur.  Please show the CHEMOTHERAPY ALERT CARD or IMMUNOTHERAPY ALERT CARD at  check-in to the Emergency Department and triage nurse.  Should you have questions after your visit or need to cancel or reschedule your appointment, please contact [facility]  Dept: [phone]  and follow the prompts.  Office hours are 8:00 a.m. to 4:30 p.m. Monday - Friday. Please note that voicemails left after 4:00 p.m. may not be returned until the following business day.  We are closed weekends and major holidays. You have access to a nurse at all times for urgent questions. Please call the main number to the clinic Dept: [phone] and follow the prompts.   For any non-urgent questions, you may also contact your provider using MyChart. We now offer e-Visits for anyone 18 and older to request care online for non-urgent symptoms. For details visit [email or web address].   Also download the MyChart app! Go to the app store, search "MyChart", open the app, select [facility], and log in with your MyChart username and password.  

## 2022-09-26 ENCOUNTER — Ambulatory Visit: Payer: Medicare PPO | Admitting: Physician Assistant

## 2022-09-26 ENCOUNTER — Other Ambulatory Visit: Payer: Medicare PPO

## 2022-09-26 ENCOUNTER — Ambulatory Visit: Payer: Medicare PPO

## 2022-09-26 DIAGNOSIS — G894 Chronic pain syndrome: Secondary | ICD-10-CM | POA: Diagnosis not present

## 2022-09-26 DIAGNOSIS — C9000 Multiple myeloma not having achieved remission: Secondary | ICD-10-CM | POA: Diagnosis not present

## 2022-09-26 DIAGNOSIS — I129 Hypertensive chronic kidney disease with stage 1 through stage 4 chronic kidney disease, or unspecified chronic kidney disease: Secondary | ICD-10-CM | POA: Diagnosis not present

## 2022-09-26 DIAGNOSIS — B372 Candidiasis of skin and nail: Secondary | ICD-10-CM | POA: Diagnosis not present

## 2022-09-26 DIAGNOSIS — E43 Unspecified severe protein-calorie malnutrition: Secondary | ICD-10-CM | POA: Diagnosis not present

## 2022-09-27 ENCOUNTER — Other Ambulatory Visit: Payer: Medicare PPO

## 2022-09-27 ENCOUNTER — Ambulatory Visit: Payer: Medicare PPO | Admitting: Physician Assistant

## 2022-09-27 ENCOUNTER — Ambulatory Visit: Payer: Medicare PPO

## 2022-09-30 ENCOUNTER — Other Ambulatory Visit: Payer: Self-pay | Admitting: *Deleted

## 2022-09-30 DIAGNOSIS — I129 Hypertensive chronic kidney disease with stage 1 through stage 4 chronic kidney disease, or unspecified chronic kidney disease: Secondary | ICD-10-CM | POA: Diagnosis not present

## 2022-09-30 DIAGNOSIS — G894 Chronic pain syndrome: Secondary | ICD-10-CM | POA: Diagnosis not present

## 2022-09-30 DIAGNOSIS — C9000 Multiple myeloma not having achieved remission: Secondary | ICD-10-CM | POA: Diagnosis not present

## 2022-09-30 DIAGNOSIS — M8448XD Pathological fracture, other site, subsequent encounter for fracture with routine healing: Secondary | ICD-10-CM | POA: Diagnosis not present

## 2022-09-30 DIAGNOSIS — E43 Unspecified severe protein-calorie malnutrition: Secondary | ICD-10-CM | POA: Diagnosis not present

## 2022-09-30 MED ORDER — LENALIDOMIDE 15 MG PO CAPS
15.0000 mg | ORAL_CAPSULE | Freq: Every day | ORAL | 0 refills | Status: DC
Start: 2022-09-30 — End: 2022-10-22

## 2022-10-01 ENCOUNTER — Telehealth: Payer: Self-pay | Admitting: Medical Oncology

## 2022-10-01 NOTE — Telephone Encounter (Signed)
[name] confirmed appts for tomorrow.

## 2022-10-02 ENCOUNTER — Inpatient Hospital Stay: Payer: Medicare PPO

## 2022-10-02 ENCOUNTER — Telehealth: Payer: Self-pay | Admitting: Medical Oncology

## 2022-10-02 ENCOUNTER — Ambulatory Visit: Payer: Medicare PPO

## 2022-10-02 ENCOUNTER — Other Ambulatory Visit: Payer: Self-pay

## 2022-10-02 VITALS — BP 142/50 | HR 60 | Temp 98.2°F | Resp 17

## 2022-10-02 DIAGNOSIS — E7800 Pure hypercholesterolemia, unspecified: Secondary | ICD-10-CM | POA: Diagnosis not present

## 2022-10-02 DIAGNOSIS — C9000 Multiple myeloma not having achieved remission: Secondary | ICD-10-CM

## 2022-10-02 DIAGNOSIS — L8990 Pressure ulcer of unspecified site, unspecified stage: Secondary | ICD-10-CM | POA: Diagnosis not present

## 2022-10-02 DIAGNOSIS — Z85238 Personal history of other malignant neoplasm of thymus: Secondary | ICD-10-CM | POA: Diagnosis not present

## 2022-10-02 DIAGNOSIS — I1310 Hypertensive heart and chronic kidney disease without heart failure, with stage 1 through stage 4 chronic kidney disease, or unspecified chronic kidney disease: Secondary | ICD-10-CM | POA: Diagnosis not present

## 2022-10-02 DIAGNOSIS — G893 Neoplasm related pain (acute) (chronic): Secondary | ICD-10-CM | POA: Diagnosis not present

## 2022-10-02 DIAGNOSIS — I350 Nonrheumatic aortic (valve) stenosis: Secondary | ICD-10-CM | POA: Diagnosis not present

## 2022-10-02 DIAGNOSIS — D649 Anemia, unspecified: Secondary | ICD-10-CM | POA: Diagnosis not present

## 2022-10-02 DIAGNOSIS — Z5112 Encounter for antineoplastic immunotherapy: Secondary | ICD-10-CM | POA: Diagnosis not present

## 2022-10-02 LAB — CBC WITH DIFFERENTIAL (CANCER CENTER ONLY)
Basophils Absolute: 0 10*3/uL (ref 0.0–0.1)
Basophils Relative: 0 %
Eosinophils Absolute: 0 10*3/uL (ref 0.0–0.5)
Eosinophils Relative: 0 %
HCT: 24 % — ABNORMAL LOW (ref 36.0–46.0)
Hemoglobin: 7.9 g/dL — ABNORMAL LOW (ref 12.0–15.0)
Lymphocytes Relative: 15 %
Lymphs Abs: 0.8 10*3/uL (ref 0.7–4.0)
MCH: 31.1 pg (ref 26.0–34.0)
MCHC: 32.9 g/dL (ref 30.0–36.0)
MCV: 94.5 fL (ref 80.0–100.0)
Monocytes Absolute: 0.8 10*3/uL (ref 0.1–1.0)
Monocytes Relative: 14 %
Neutro Abs: 3.7 10*3/uL (ref 1.7–7.7)
Neutrophils Relative %: 69 %
Platelet Count: 200 10*3/uL (ref 150–400)
RBC: 2.54 MIL/uL — ABNORMAL LOW (ref 3.87–5.11)
RDW: 21 % — ABNORMAL HIGH (ref 11.5–15.5)
Smear Review: NORMAL
WBC Count: 5.3 10*3/uL (ref 4.0–10.5)
nRBC: 0 % (ref 0.0–0.2)

## 2022-10-02 LAB — CMP (CANCER CENTER ONLY)
ALT: 79 U/L — ABNORMAL HIGH (ref 0–44)
AST: 19 U/L (ref 15–41)
Albumin: 2.4 g/dL — ABNORMAL LOW (ref 3.5–5.0)
Alkaline Phosphatase: 521 U/L — ABNORMAL HIGH (ref 38–126)
Anion gap: 7 (ref 5–15)
BUN: 22 mg/dL (ref 8–23)
CO2: 27 mmol/L (ref 22–32)
Calcium: 7.8 mg/dL — ABNORMAL LOW (ref 8.9–10.3)
Chloride: 105 mmol/L (ref 98–111)
Creatinine: 0.5 mg/dL (ref 0.44–1.00)
GFR, Estimated: >60 mL/min (ref >60)
Glucose, Bld: 152 mg/dL — ABNORMAL HIGH (ref 70–99)
Potassium: 3.4 mmol/L — ABNORMAL LOW (ref 3.5–5.1)
Sodium: 139 mmol/L (ref 135–145)
Total Bilirubin: 0.3 mg/dL (ref 0.3–1.2)
Total Protein: 4.9 g/dL — ABNORMAL LOW (ref 6.5–8.1)

## 2022-10-02 LAB — LACTATE DEHYDROGENASE: LDH: 228 U/L — ABNORMAL HIGH (ref 98–192)

## 2022-10-02 LAB — SAMPLE TO BLOOD BANK

## 2022-10-02 MED ORDER — ACETAMINOPHEN 325 MG PO TABS
650.0000 mg | ORAL_TABLET | Freq: Once | ORAL | Status: AC
  Administered 2022-10-02: 650 mg via ORAL
  Filled 2022-10-02: qty 2

## 2022-10-02 MED ORDER — DEXAMETHASONE 4 MG PO TABS
20.0000 mg | ORAL_TABLET | Freq: Once | ORAL | Status: AC
  Administered 2022-10-02: 20 mg via ORAL
  Filled 2022-10-02: qty 5

## 2022-10-02 MED ORDER — DIPHENHYDRAMINE HCL 25 MG PO CAPS
50.0000 mg | ORAL_CAPSULE | Freq: Once | ORAL | Status: AC
  Administered 2022-10-02: 50 mg via ORAL
  Filled 2022-10-02: qty 2

## 2022-10-02 MED ORDER — DARATUMUMAB-HYALURONIDASE-FIHJ 1800-30000 MG-UT/15ML ~~LOC~~ SOLN
1800.0000 mg | Freq: Once | SUBCUTANEOUS | Status: AC
  Administered 2022-10-02: 1800 mg via SUBCUTANEOUS
  Filled 2022-10-02: qty 15

## 2022-10-02 NOTE — Telephone Encounter (Signed)
[name] has Home Health . [facility]  nurses will see pt and evaluate wound status. If they are unable  to   take care of her wound then they will ask PCP to refer pt to wound clinic.

## 2022-10-02 NOTE — Telephone Encounter (Signed)
Son called. Pt still needs bed due to treatment time. A recliner is not comfortable after an hour. Today , she is coming via car then wheelchair. I told him she is assigned a bed today.

## 2022-10-02 NOTE — Patient Instructions (Signed)
[facility]  Discharge Instructions: Thank you for choosing [facility] to provide your oncology and hematology care.   If you have a lab appointment with the Cancer Center, please go directly to the Cancer Center and check in at the registration area.   Wear comfortable clothing and clothing appropriate for easy access to any Portacath or PICC line.   We strive to give you quality time with your provider. You may need to reschedule your appointment if you arrive late (15 or more minutes).  Arriving late affects you and other patients whose appointments are after yours.  Also, if you miss three or more appointments without notifying the office, you may be dismissed from the clinic at the provider's discretion.      For prescription refill requests, have your pharmacy contact our office and allow 72 hours for refills to be completed.    Today you received the following chemotherapy and/or immunotherapy agents Darzalex Faspro      To help prevent nausea and vomiting after your treatment, we encourage you to take your nausea medication as directed.  BELOW ARE SYMPTOMS THAT SHOULD BE REPORTED IMMEDIATELY: *FEVER GREATER THAN 100.4 F (38 C) OR HIGHER *CHILLS OR SWEATING *NAUSEA AND VOMITING THAT IS NOT CONTROLLED WITH YOUR NAUSEA MEDICATION *UNUSUAL SHORTNESS OF BREATH *UNUSUAL BRUISING OR BLEEDING *URINARY PROBLEMS (pain or burning when urinating, or frequent urination) *BOWEL PROBLEMS (unusual diarrhea, constipation, pain near the anus) TENDERNESS IN MOUTH AND THROAT WITH OR WITHOUT PRESENCE OF ULCERS (sore throat, sores in mouth, or a toothache) UNUSUAL RASH, SWELLING OR PAIN  UNUSUAL VAGINAL DISCHARGE OR ITCHING   Items with * indicate a potential emergency and should be followed up as soon as possible or go to the Emergency Department if any problems should occur.  Please show the CHEMOTHERAPY ALERT CARD or IMMUNOTHERAPY ALERT CARD at  check-in to the Emergency Department and triage nurse.  Should you have questions after your visit or need to cancel or reschedule your appointment, please contact [facility]  Dept: [phone]  and follow the prompts.  Office hours are 8:00 a.m. to 4:30 p.m. Monday - Friday. Please note that voicemails left after 4:00 p.m. may not be returned until the following business day.  We are closed weekends and major holidays. You have access to a nurse at all times for urgent questions. Please call the main number to the clinic Dept: [phone] and follow the prompts.   For any non-urgent questions, you may also contact your provider using MyChart. We now offer e-Visits for anyone 18 and older to request care online for non-urgent symptoms. For details visit [email or web address].   Also download the MyChart app! Go to the app store, search "MyChart", open the app, select [facility], and log in with your MyChart username and password.  

## 2022-10-02 NOTE — Progress Notes (Signed)
Per Dr Arbutus Ped, ok to tx with HGB 7.9.

## 2022-10-03 ENCOUNTER — Ambulatory Visit: Payer: Medicare PPO

## 2022-10-03 ENCOUNTER — Other Ambulatory Visit: Payer: Self-pay | Admitting: Medical Oncology

## 2022-10-03 ENCOUNTER — Other Ambulatory Visit: Payer: Medicare PPO

## 2022-10-03 ENCOUNTER — Telehealth: Payer: Self-pay | Admitting: Medical Oncology

## 2022-10-03 DIAGNOSIS — I272 Pulmonary hypertension, unspecified: Secondary | ICD-10-CM | POA: Diagnosis not present

## 2022-10-03 DIAGNOSIS — C9000 Multiple myeloma not having achieved remission: Secondary | ICD-10-CM | POA: Diagnosis not present

## 2022-10-03 DIAGNOSIS — I824Z2 Acute embolism and thrombosis of unspecified deep veins of left distal lower extremity: Secondary | ICD-10-CM | POA: Diagnosis not present

## 2022-10-03 DIAGNOSIS — G893 Neoplasm related pain (acute) (chronic): Secondary | ICD-10-CM | POA: Diagnosis not present

## 2022-10-03 DIAGNOSIS — E43 Unspecified severe protein-calorie malnutrition: Secondary | ICD-10-CM | POA: Diagnosis not present

## 2022-10-03 DIAGNOSIS — I129 Hypertensive chronic kidney disease with stage 1 through stage 4 chronic kidney disease, or unspecified chronic kidney disease: Secondary | ICD-10-CM | POA: Diagnosis not present

## 2022-10-03 DIAGNOSIS — D630 Anemia in neoplastic disease: Secondary | ICD-10-CM | POA: Diagnosis not present

## 2022-10-03 DIAGNOSIS — L89154 Pressure ulcer of sacral region, stage 4: Secondary | ICD-10-CM | POA: Diagnosis not present

## 2022-10-03 DIAGNOSIS — C7951 Secondary malignant neoplasm of bone: Secondary | ICD-10-CM | POA: Diagnosis not present

## 2022-10-03 NOTE — Telephone Encounter (Signed)
[name] is now at home . Home Health is coming out today for assessment and skilled nursing for wound care,  medication management and symptom assessment. Medication reconciliation completed with son after he picked up rx from pharmacy. I recommended son get an appt with her assigned new PCP at [facility] so she can establish with Dewayne Hatch.

## 2022-10-04 ENCOUNTER — Other Ambulatory Visit: Payer: Medicare PPO

## 2022-10-04 ENCOUNTER — Ambulatory Visit: Payer: Medicare PPO

## 2022-10-04 DIAGNOSIS — Z95828 Presence of other vascular implants and grafts: Secondary | ICD-10-CM | POA: Diagnosis not present

## 2022-10-04 DIAGNOSIS — E43 Unspecified severe protein-calorie malnutrition: Secondary | ICD-10-CM | POA: Diagnosis not present

## 2022-10-04 DIAGNOSIS — C7951 Secondary malignant neoplasm of bone: Secondary | ICD-10-CM | POA: Diagnosis not present

## 2022-10-04 DIAGNOSIS — Z8719 Personal history of other diseases of the digestive system: Secondary | ICD-10-CM | POA: Diagnosis not present

## 2022-10-04 DIAGNOSIS — C9000 Multiple myeloma not having achieved remission: Secondary | ICD-10-CM | POA: Diagnosis not present

## 2022-10-04 DIAGNOSIS — L98423 Non-pressure chronic ulcer of back with necrosis of muscle: Secondary | ICD-10-CM | POA: Diagnosis not present

## 2022-10-07 DIAGNOSIS — L89154 Pressure ulcer of sacral region, stage 4: Secondary | ICD-10-CM | POA: Diagnosis not present

## 2022-10-07 DIAGNOSIS — G893 Neoplasm related pain (acute) (chronic): Secondary | ICD-10-CM | POA: Diagnosis not present

## 2022-10-07 DIAGNOSIS — D630 Anemia in neoplastic disease: Secondary | ICD-10-CM | POA: Diagnosis not present

## 2022-10-07 DIAGNOSIS — C7951 Secondary malignant neoplasm of bone: Secondary | ICD-10-CM | POA: Diagnosis not present

## 2022-10-07 DIAGNOSIS — I129 Hypertensive chronic kidney disease with stage 1 through stage 4 chronic kidney disease, or unspecified chronic kidney disease: Secondary | ICD-10-CM | POA: Diagnosis not present

## 2022-10-07 DIAGNOSIS — I272 Pulmonary hypertension, unspecified: Secondary | ICD-10-CM | POA: Diagnosis not present

## 2022-10-07 DIAGNOSIS — E43 Unspecified severe protein-calorie malnutrition: Secondary | ICD-10-CM | POA: Diagnosis not present

## 2022-10-07 DIAGNOSIS — I824Z2 Acute embolism and thrombosis of unspecified deep veins of left distal lower extremity: Secondary | ICD-10-CM | POA: Diagnosis not present

## 2022-10-07 DIAGNOSIS — C9000 Multiple myeloma not having achieved remission: Secondary | ICD-10-CM | POA: Diagnosis not present

## 2022-10-07 NOTE — Progress Notes (Unsigned)
Palliative Medicine [facility]  [phone] [phone]) [phone]   Name: [name] Date: 10/07/2022 MRN: [id number]  DOB: 1940/10/03  Patient Care Team: [name], MD as PCP - General (Internal Medicine) Quintella Reichert, MD as PCP - Cardiology (Cardiology) Quintella Reichert, MD as Consulting Physician (Cardiology)    INTERVAL HISTORY: [name] is a 82 y.o. 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 Palliative ask to see for symptom management and goals of care.   SOCIAL HISTORY:     reports that she has never smoked. She has never used smokeless tobacco. She reports that she does not drink alcohol and does not use drugs.  ADVANCE DIRECTIVES:  Advanced directives on file  CODE STATUS: DNR  PAST 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 by echo 10/2021   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/2015   Carotid artery stenosis    < 50% stenosis bilaterally by doppler 07/2016   Coronary artery disease 2008   Coronary Ca score of 331 with minimal multivessel plaque < 25% stenosis by coronary CTA 8/23   Heart murmur    per pt   Hypercholesteremia    Hypertension    Obesity    Osteopenia    declines treatment   Pneumonia 1995   Pulmonary HTN (HCC)    mild to moderate by echo 7/23 with PASP   Shoulder pain    Due to arthritis   Vitamin D insufficiency     ALLERGIES:  is allergic to crestor [rosuvastatin calcium], lactose, lipitor [atorvastatin], pravastatin, simvastatin, tramadol, zetia [ezetimibe], codeine, penicillins, sulfa antibiotics, and  vancomycin.  MEDICATIONS:  Current Outpatient Medications  Medication Sig Dispense Refill   acetaminophen (TYLENOL) 325 MG tablet Take 2 tablets (650 mg total) by mouth every 8 (eight) hours as needed.     acyclovir (ZOVIRAX) 200 MG capsule Take 200 mg by mouth 2 (two) times daily.     allopurinol (ZYLOPRIM) 100 MG tablet Take 1 tablet (100 mg total) by mouth 2 (two) times daily. 60 tablet 2   ascorbic acid (VITAMIN C) 500 MG tablet Take 1 tablet (500 mg total) by mouth daily.     daratumumab-hyaluronidase-fihj (DARZALEX FASPRO) 1800-30000 MG-UT/15ML SOLN Inject 1,800 mg into the skin once.     dexamethasone (DECADRON) 2 MG tablet Take 1 tablet (2 mg total) by mouth daily.     dexamethasone (DECADRON) 4 MG tablet Take 20 mg by mouth once a week. For multiple myeloma as part of her treatment (Patient not taking: Reported on 09/11/2022)     gentamicin ointment (GARAMYCIN) 0.1 % Apply 1 Application topically daily.     H-CHLOR 12 0.125 % SOLN      lenalidomide (REVLIMID) 15 MG capsule Take 1 capsule (15 mg total) by mouth daily. Take for 21 days, then hold for 7 days. Repeat every 28 days. 21 capsule 0   lidocaine (LIDODERM) 5 % Place 1 patch onto the skin daily. Remove & Discard patch within 12 hours or as directed by MD 30 patch 0  Melatonin 2.5 MG CHEW Chew 2.5-5 mg by mouth at bedtime as needed. For sleep 60 tablet 5   Multiple Vitamin (MULTIVITAMIN WITH MINERALS) TABS tablet Take 1 tablet by mouth daily.     multivitamin (RENA-VIT) TABS tablet Take 1 tablet by mouth at bedtime.  0   naloxone (NARCAN) nasal spray 4 mg/0.1 mL SMARTSIG:1 Both Nares Daily (Patient not taking: Reported on 09/11/2022)     Nutritional Supplements (FEEDING SUPPLEMENT, NEPRO CARB STEADY,) LIQD Take 237 mLs by mouth 2 (two) times daily between meals.  0   pantoprazole (PROTONIX) 40 MG tablet Take 1 tablet (40 mg total) by mouth 2 (two) times daily. 30 tablet 0   potassium chloride SA (KLOR-CON M) 20 MEQ tablet Take 1  tablet (20 mEq total) by mouth daily. 6 tablet 0   prochlorperazine (COMPAZINE) 10 MG tablet Take 1 tablet (10 mg total) by mouth every 6 (six) hours as needed for nausea or vomiting. (Patient not taking: Reported on 09/11/2022) 30 tablet 0   sucralfate (CARAFATE) 1 GM/10ML suspension Take 10 mLs (1 g total) by mouth 4 (four) times daily -  with meals and at bedtime. 420 mL 0   No current facility-administered medications for this visit.    VITAL SIGNS: There were no vitals taken for this visit. There were no vitals filed for this visit.  Estimated body mass index is 26.47 kg/m as calculated from the following:   Height as of 08/31/22: 5\' 1"  (1.549 m).   Weight as of 09/25/22: 140 lb 1.6 oz (63.5 kg).   PERFORMANCE STATUS (ECOG) : 3 - Symptomatic, >50% confined to bed   Physical Exam General: NAD Cardiovascular: regular rate and rhythm Pulmonary: normal breathing pattern  Extremities: no edema, no joint deformities Skin: no rashes Neurological: AAO x4  IMPRESSION: I saw [name] during her infusion. No acute distress. She is resting comfortably in the bed. Her son is at bedside. Patient has return home from SNF and is much appreciative of this. Son shares she has been doing well since she has returned home. Family has been caring for her and also has hired caregivers coming in to assist with care. [name] states their family member is a wound nurse and has been able to assist with patient's sacral wound and dressing until they are able to get to the wound center.   [name] reports her appetite has been good. She is looking forward to going to pick up lunch when she leaves clinic today. Shares she will be eating [employer]. Denies nausea, vomiting, constipation, diarrhea.   Neoplasm related pain/decubitus ulcer [name] states her pain is much improved. She is not requiring pain medication. Occasional Tylenol use. Is not requiring oxycodone since returning home. Son reports most of her  pain is in her sacral area when changing dressing or changing position. Once she is done or stabilized pain is resolved.    No adjustments to current regimen at this time.  We will continue to closely monitor.  Son knows to contact our office as needed.   Insomnia Ms. Rose feels she is sleeping much better since returning home in her own environment. Not requiring any supplements such as melatonin.   I discussed the importance of continued conversation with family and their medical providers regarding overall plan of care and treatment options, ensuring decisions are within the context of the patients values and GOCs.  PLAN: Tylenol 650 mg every 8 hours as needed Ongoing symptom management and goals of  care discussions as needed I will plan to see patient back in 2-4 weeks in collaboration to other oncology appointments.  Family knows to contact office sooner if needed.   Patient expressed understanding and was in agreement with this plan. She also understands that She can call the clinic at any time with any questions, concerns, or complaints.   Any controlled substances utilized were prescribed in the context of palliative care. PDMP has been reviewed.    Visit consisted of counseling and education dealing with the complex and emotionally intense issues of symptom management and palliative care in the setting of serious and potentially life-threatening illness.Greater than 50%  of this time was spent counseling and coordinating care related to the above assessment and plan.  Willette Alma, AGPCNP-BC  Palliative Medicine Team/[facility]  *Please note that this is a verbal dictation therefore any spelling or grammatical errors are due to the "Dragon [employer] One" system interpretation.

## 2022-10-08 DIAGNOSIS — C9000 Multiple myeloma not having achieved remission: Secondary | ICD-10-CM | POA: Diagnosis not present

## 2022-10-08 DIAGNOSIS — D630 Anemia in neoplastic disease: Secondary | ICD-10-CM | POA: Diagnosis not present

## 2022-10-08 DIAGNOSIS — I824Z2 Acute embolism and thrombosis of unspecified deep veins of left distal lower extremity: Secondary | ICD-10-CM | POA: Diagnosis not present

## 2022-10-08 DIAGNOSIS — G893 Neoplasm related pain (acute) (chronic): Secondary | ICD-10-CM | POA: Diagnosis not present

## 2022-10-08 DIAGNOSIS — L89154 Pressure ulcer of sacral region, stage 4: Secondary | ICD-10-CM | POA: Diagnosis not present

## 2022-10-08 DIAGNOSIS — C7951 Secondary malignant neoplasm of bone: Secondary | ICD-10-CM | POA: Diagnosis not present

## 2022-10-08 DIAGNOSIS — I129 Hypertensive chronic kidney disease with stage 1 through stage 4 chronic kidney disease, or unspecified chronic kidney disease: Secondary | ICD-10-CM | POA: Diagnosis not present

## 2022-10-08 DIAGNOSIS — I272 Pulmonary hypertension, unspecified: Secondary | ICD-10-CM | POA: Diagnosis not present

## 2022-10-08 DIAGNOSIS — E43 Unspecified severe protein-calorie malnutrition: Secondary | ICD-10-CM | POA: Diagnosis not present

## 2022-10-09 ENCOUNTER — Inpatient Hospital Stay (HOSPITAL_BASED_OUTPATIENT_CLINIC_OR_DEPARTMENT_OTHER): Payer: Medicare PPO | Admitting: Internal Medicine

## 2022-10-09 ENCOUNTER — Ambulatory Visit: Payer: Medicare PPO

## 2022-10-09 ENCOUNTER — Other Ambulatory Visit: Payer: Self-pay | Admitting: Medical Oncology

## 2022-10-09 ENCOUNTER — Inpatient Hospital Stay: Payer: Medicare PPO

## 2022-10-09 ENCOUNTER — Other Ambulatory Visit: Payer: Self-pay

## 2022-10-09 ENCOUNTER — Inpatient Hospital Stay (HOSPITAL_BASED_OUTPATIENT_CLINIC_OR_DEPARTMENT_OTHER): Payer: Medicare PPO | Admitting: Nurse Practitioner

## 2022-10-09 ENCOUNTER — Inpatient Hospital Stay: Payer: Medicare PPO | Attending: Internal Medicine

## 2022-10-09 DIAGNOSIS — Z7961 Long term (current) use of immunomodulator: Secondary | ICD-10-CM | POA: Insufficient documentation

## 2022-10-09 DIAGNOSIS — Z515 Encounter for palliative care: Secondary | ICD-10-CM

## 2022-10-09 DIAGNOSIS — C9000 Multiple myeloma not having achieved remission: Secondary | ICD-10-CM

## 2022-10-09 DIAGNOSIS — M8580 Other specified disorders of bone density and structure, unspecified site: Secondary | ICD-10-CM | POA: Diagnosis not present

## 2022-10-09 DIAGNOSIS — Z5112 Encounter for antineoplastic immunotherapy: Secondary | ICD-10-CM | POA: Insufficient documentation

## 2022-10-09 DIAGNOSIS — E43 Unspecified severe protein-calorie malnutrition: Secondary | ICD-10-CM | POA: Diagnosis not present

## 2022-10-09 DIAGNOSIS — I272 Pulmonary hypertension, unspecified: Secondary | ICD-10-CM | POA: Diagnosis not present

## 2022-10-09 DIAGNOSIS — I824Z2 Acute embolism and thrombosis of unspecified deep veins of left distal lower extremity: Secondary | ICD-10-CM | POA: Diagnosis not present

## 2022-10-09 DIAGNOSIS — Z79624 Long term (current) use of inhibitors of nucleotide synthesis: Secondary | ICD-10-CM | POA: Insufficient documentation

## 2022-10-09 DIAGNOSIS — G893 Neoplasm related pain (acute) (chronic): Secondary | ICD-10-CM | POA: Diagnosis not present

## 2022-10-09 DIAGNOSIS — Z7952 Long term (current) use of systemic steroids: Secondary | ICD-10-CM | POA: Diagnosis not present

## 2022-10-09 DIAGNOSIS — D649 Anemia, unspecified: Secondary | ICD-10-CM

## 2022-10-09 DIAGNOSIS — C7951 Secondary malignant neoplasm of bone: Secondary | ICD-10-CM | POA: Diagnosis not present

## 2022-10-09 DIAGNOSIS — D630 Anemia in neoplastic disease: Secondary | ICD-10-CM | POA: Diagnosis not present

## 2022-10-09 DIAGNOSIS — Z79899 Other long term (current) drug therapy: Secondary | ICD-10-CM | POA: Insufficient documentation

## 2022-10-09 DIAGNOSIS — R530 Neoplastic (malignant) related fatigue: Secondary | ICD-10-CM

## 2022-10-09 DIAGNOSIS — I129 Hypertensive chronic kidney disease with stage 1 through stage 4 chronic kidney disease, or unspecified chronic kidney disease: Secondary | ICD-10-CM | POA: Diagnosis not present

## 2022-10-09 DIAGNOSIS — L89154 Pressure ulcer of sacral region, stage 4: Secondary | ICD-10-CM | POA: Diagnosis not present

## 2022-10-09 LAB — CMP (CANCER CENTER ONLY)
ALT: 67 U/L — ABNORMAL HIGH (ref 0–44)
AST: 23 U/L (ref 15–41)
Albumin: 2.5 g/dL — ABNORMAL LOW (ref 3.5–5.0)
Alkaline Phosphatase: 474 U/L — ABNORMAL HIGH (ref 38–126)
Anion gap: 8 (ref 5–15)
BUN: 18 mg/dL (ref 8–23)
CO2: 26 mmol/L (ref 22–32)
Calcium: 7.9 mg/dL — ABNORMAL LOW (ref 8.9–10.3)
Chloride: 104 mmol/L (ref 98–111)
Creatinine: 0.62 mg/dL (ref 0.44–1.00)
GFR, Estimated: >60 mL/min (ref >60)
Glucose, Bld: 230 mg/dL — ABNORMAL HIGH (ref 70–99)
Potassium: 3.8 mmol/L (ref 3.5–5.1)
Sodium: 138 mmol/L (ref 135–145)
Total Bilirubin: 0.3 mg/dL (ref 0.3–1.2)
Total Protein: 4.4 g/dL — ABNORMAL LOW (ref 6.5–8.1)

## 2022-10-09 LAB — CBC WITH DIFFERENTIAL (CANCER CENTER ONLY)
Abs Immature Granulocytes: 0.11 10*3/uL — ABNORMAL HIGH (ref 0.00–0.07)
Basophils Absolute: 0 10*3/uL (ref 0.0–0.1)
Basophils Relative: 1 %
Eosinophils Absolute: 0.1 10*3/uL (ref 0.0–0.5)
Eosinophils Relative: 1 %
HCT: 24.7 % — ABNORMAL LOW (ref 36.0–46.0)
Hemoglobin: 8 g/dL — ABNORMAL LOW (ref 12.0–15.0)
Immature Granulocytes: 1 %
Lymphocytes Relative: 13 %
Lymphs Abs: 1 10*3/uL (ref 0.7–4.0)
MCH: 32 pg (ref 26.0–34.0)
MCHC: 32.4 g/dL (ref 30.0–36.0)
MCV: 98.8 fL (ref 80.0–100.0)
Monocytes Absolute: 0.8 10*3/uL (ref 0.1–1.0)
Monocytes Relative: 10 %
Neutro Abs: 6 10*3/uL (ref 1.7–7.7)
Neutrophils Relative %: 74 %
Platelet Count: 207 10*3/uL (ref 150–400)
RBC: 2.5 MIL/uL — ABNORMAL LOW (ref 3.87–5.11)
RDW: 21.8 % — ABNORMAL HIGH (ref 11.5–15.5)
WBC Count: 8 10*3/uL (ref 4.0–10.5)
nRBC: 0 % (ref 0.0–0.2)

## 2022-10-09 LAB — SAMPLE TO BLOOD BANK

## 2022-10-09 LAB — PREPARE RBC (CROSSMATCH)

## 2022-10-09 LAB — LACTATE DEHYDROGENASE: LDH: 280 U/L — ABNORMAL HIGH (ref 98–192)

## 2022-10-09 MED ORDER — ACETAMINOPHEN 325 MG PO TABS
650.0000 mg | ORAL_TABLET | Freq: Once | ORAL | Status: AC
  Administered 2022-10-09: 650 mg via ORAL
  Filled 2022-10-09: qty 2

## 2022-10-09 MED ORDER — DIPHENHYDRAMINE HCL 25 MG PO CAPS
50.0000 mg | ORAL_CAPSULE | Freq: Once | ORAL | Status: AC
  Administered 2022-10-09: 50 mg via ORAL
  Filled 2022-10-09: qty 2

## 2022-10-09 MED ORDER — DARATUMUMAB-HYALURONIDASE-FIHJ 1800-30000 MG-UT/15ML ~~LOC~~ SOLN
1800.0000 mg | Freq: Once | SUBCUTANEOUS | Status: AC
  Administered 2022-10-09: 1800 mg via SUBCUTANEOUS
  Filled 2022-10-09: qty 15

## 2022-10-09 MED ORDER — DEXAMETHASONE 4 MG PO TABS
20.0000 mg | ORAL_TABLET | Freq: Once | ORAL | Status: AC
  Administered 2022-10-09: 20 mg via ORAL
  Filled 2022-10-09: qty 5

## 2022-10-09 NOTE — Progress Notes (Signed)
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 Dorris Fetch on January 28, 2022.    CURRENT THERAPY:  1) Starting systemic therapy with Revlimid 21 days on 7 days off, Darzalex, and 20 mg of Decadron weekly.  First dose expected next week on 09/11/2022.  Status post 1 cycle. 2) Monthly Zometa injections for 6 to 12 months, followed by every 3 months thereon after once dental clearance is obtained.  INTERVAL HISTORY: [name] 82 y.o. female returns to the clinic today for follow-up visit accompanied by her son.  The patient is feeling very well today with no concerning complaints except for the baseline fatigue secondary to anemia of chronic disease.  She denied having any chest pain but has shortness of breath with exertion with no cough or hemoptysis.  She has no nausea, vomiting, diarrhea or constipation.  She tolerated the first cycle of her treatment with daratumumab, Revlimid and Decadron fairly well.  She is here today for evaluation before starting cycle #2.   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 by echo 10/2021   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/2015   Carotid artery stenosis    < 50% stenosis bilaterally by doppler 07/2016   Coronary artery disease 2008   Coronary  Ca score of 331 with minimal multivessel plaque < 25% stenosis by coronary CTA 8/23   Heart murmur    per pt   Hypercholesteremia    Hypertension    Obesity    Osteopenia    declines treatment   Pneumonia 1995   Pulmonary HTN (HCC)    mild to moderate by echo 7/23 with PASP   Shoulder pain    Due to arthritis   Vitamin D insufficiency     ALLERGIES:  is allergic to crestor [rosuvastatin calcium], lactose, lipitor [atorvastatin], pravastatin, simvastatin, tramadol, zetia [ezetimibe], codeine, penicillins, sulfa antibiotics, and vancomycin.  MEDICATIONS:  Current Outpatient Medications  Medication Sig Dispense Refill   acetaminophen (TYLENOL) 325 MG tablet Take 2 tablets (650 mg total) by mouth every 8 (eight) hours as needed.     acyclovir (ZOVIRAX) 200 MG capsule Take 200 mg by mouth 2 (two) times daily.     allopurinol (ZYLOPRIM) 100 MG tablet Take 1 tablet (100 mg total) by mouth 2 (two) times daily. 60 tablet 2   ascorbic acid (VITAMIN C) 500 MG tablet Take 1 tablet (500 mg total) by mouth daily.     daratumumab-hyaluronidase-fihj (DARZALEX FASPRO) 1800-30000 MG-UT/15ML SOLN Inject 1,800 mg into the skin once.     dexamethasone (DECADRON) 2 MG tablet Take 1 tablet (2 mg total) by mouth daily.     dexamethasone (DECADRON) 4 MG tablet Take 20 mg by mouth once  a week. For multiple myeloma as part of her treatment     gentamicin ointment (GARAMYCIN) 0.1 % Apply 1 Application topically daily.     H-CHLOR 12 0.125 % SOLN      lenalidomide (REVLIMID) 15 MG capsule Take 1 capsule (15 mg total) by mouth daily. Take for 21 days, then hold for 7 days. Repeat every 28 days. (Patient taking differently: Take 15 mg by mouth daily. Take for 21 days, then hold for 7 days. Repeat every 28 days. Last pill taken last Wed) 21 capsule 0   levofloxacin (LEVAQUIN) 500 MG tablet Take 500 mg by mouth daily.     lidocaine (LIDODERM) 5 % Place 1 patch onto the skin daily. Remove & Discard patch  within 12 hours or as directed by MD 30 patch 0   Multiple Vitamin (MULTIVITAMIN WITH MINERALS) TABS tablet Take 1 tablet by mouth daily.     naloxone (NARCAN) nasal spray 4 mg/0.1 mL      Nutritional Supplements (FEEDING SUPPLEMENT, NEPRO CARB STEADY,) LIQD Take 237 mLs by mouth 2 (two) times daily between meals.  0   pantoprazole (PROTONIX) 40 MG tablet Take 1 tablet (40 mg total) by mouth 2 (two) times daily. 30 tablet 0   prochlorperazine (COMPAZINE) 10 MG tablet Take 1 tablet (10 mg total) by mouth every 6 (six) hours as needed for nausea or vomiting. 30 tablet 0   sodium hypochlorite (DAKIN'S 1/4 STRENGTH) 0.125 % SOLN 1 application Externally Once a day for 30 days     sucralfate (CARAFATE) 1 GM/10ML suspension Take 10 mLs (1 g total) by mouth 4 (four) times daily -  with meals and at bedtime. 420 mL 0   Melatonin 2.5 MG CHEW Chew 2.5-5 mg by mouth at bedtime as needed. For sleep (Patient not taking: Reported on 10/09/2022) 60 tablet 5   multivitamin (RENA-VIT) TABS tablet Take 1 tablet by mouth at bedtime. (Patient not taking: Reported on 10/09/2022)  0   potassium chloride SA (KLOR-CON M) 20 MEQ tablet Take 1 tablet (20 mEq total) by mouth daily. (Patient not taking: Reported on 10/09/2022) 6 tablet 0   No current facility-administered medications for this visit.    SURGICAL HISTORY:  Past Surgical History:  Procedure Laterality Date   AORTIC VALVE REPLACEMENT N/A 10/14/2013   Procedure: AORTIC VALVE REPLACEMENT (AVR);  Surgeon: Alleen Borne, MD;  Location: [facility] OR;  Service: Open Heart Surgery;  Laterality: N/A;   CARDIAC CATHETERIZATION     CATARACT EXTRACTION, BILATERAL     CHOLECYSTECTOMY  04/08/1988   ESOPHAGOGASTRODUODENOSCOPY (EGD) WITH PROPOFOL N/A 09/05/2022   Procedure: ESOPHAGOGASTRODUODENOSCOPY (EGD) WITH PROPOFOL;  Surgeon: Kathi Der, MD;  Location: [facility] ENDOSCOPY;  Service: Gastroenterology;  Laterality: N/A;   INTRAOPERATIVE TRANSESOPHAGEAL ECHOCARDIOGRAM N/A  10/14/2013   Procedure: INTRAOPERATIVE TRANSESOPHAGEAL ECHOCARDIOGRAM;  Surgeon: Alleen Borne, MD;  Location: [facility] OR;  Service: Open Heart Surgery;  Laterality: N/A;   IVC FILTER INSERTION N/A 09/06/2022   Procedure: IVC FILTER INSERTION;  Surgeon: Leonie Douglas, MD;  Location: [facility] INVASIVE CV LAB;  Service: Cardiovascular;  Laterality: N/A;   KYPHOPLASTY Bilateral 03/27/2022   Procedure: KYPHOPLASTY AND BIOPSY THORACIC ELEVEN;  Surgeon: Lisbeth Renshaw, MD;  Location: [facility] OR;  Service: Neurosurgery;  Laterality: Bilateral;   LEFT AND RIGHT HEART CATHETERIZATION WITH CORONARY ANGIOGRAM N/A 09/16/2013   Procedure: LEFT AND RIGHT HEART CATHETERIZATION WITH CORONARY ANGIOGRAM;  Surgeon: Quintella Reichert, MD;  Location: [facility] CATH LAB;  Service: Cardiovascular;  Laterality: N/A;  TONSILLECTOMY      REVIEW OF SYSTEMS:  Constitutional: positive for fatigue Eyes: negative Ears, nose, mouth, throat, and face: negative Respiratory: positive for dyspnea on exertion Cardiovascular: negative Gastrointestinal: negative Genitourinary:negative Integument/breast: negative Hematologic/lymphatic: negative Musculoskeletal:positive for muscle weakness Neurological: negative Behavioral/Psych: negative Endocrine: negative Allergic/Immunologic: negative   PHYSICAL EXAMINATION: General appearance: alert, cooperative, fatigued, and no distress Head: Normocephalic, without obvious abnormality, atraumatic Neck: no adenopathy, no JVD, supple, symmetrical, trachea midline, and thyroid not enlarged, symmetric, no tenderness/mass/nodules Lymph nodes: Cervical, supraclavicular, and axillary nodes normal. Resp: clear to auscultation bilaterally Back: symmetric, no curvature. ROM normal. No CVA tenderness. Cardio: regular rate and rhythm, S1, S2 normal, no murmur, click, rub or gallop GI: soft, non-tender; bowel sounds normal; no masses,  no organomegaly Extremities: extremities normal, atraumatic, no cyanosis or  edema Neurologic: Alert and oriented X 3, normal strength and tone. Normal symmetric reflexes. Normal coordination and gait  ECOG PERFORMANCE STATUS: 2 - Symptomatic, <50% confined to bed  Blood pressure (!) 135/46, pulse 60, temperature 98.8 F (37.1 C), resp. rate 18, weight 143 lb 12.8 oz (65.2 kg), SpO2 97 %.  LABORATORY DATA: Lab Results  Component Value Date   WBC 8.0 10/09/2022   HGB 8.0 (L) 10/09/2022   HCT 24.7 (L) 10/09/2022   MCV 98.8 10/09/2022   PLT 207 10/09/2022      Chemistry      Component Value Date/Time   NA 138 10/09/2022 1316   NA 141 10/06/2020 0939   K 3.8 10/09/2022 1316   CL 104 10/09/2022 1316   CO2 26 10/09/2022 1316   BUN 18 10/09/2022 1316   BUN 17 10/06/2020 0939   CREATININE 0.62 10/09/2022 1316   CREATININE 0.85 01/29/2016 0910      Component Value Date/Time   CALCIUM 7.9 (L) 10/09/2022 1316   ALKPHOS 474 (H) 10/09/2022 1316   AST 23 10/09/2022 1316   ALT 67 (H) 10/09/2022 1316   BILITOT 0.3 10/09/2022 1316       RADIOGRAPHIC STUDIES: No results found.  ASSESSMENT AND PLAN: This is a very pleasant 82 years old white female with  1) history of stage I (T1a, N0, M0) thymoma type AB diagnosed in October 2023 status post resection under the care of Dr. Dorris Fetch on January 28, 2022 with close resection margin and microscopic infiltration of the capsule. 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  The patient is undergoing systemic therapy with daratumumab, Revlimid 15 mg p.o. daily for 21 days every 4 weeks in addition to Decadron 20 mg weekly with the treatment.  Status post 1 cycle.  She tolerated the first cycle of her treatment fairly well with no concerning adverse effects. I recommended for her to proceed with cycle #2 today as planned. For the anemia of neoplastic disease, we will arrange for the patient to receive 1 unit of PRBCs transfusion either Friday or Saturday of this week. For the malnutrition, I  will refer her to the dietitian at the cancer center for evaluation of her nutritional needs She will come back for follow-up visit in 4 weeks for evaluation before starting cycle #3. The patient was advised to call immediately if she has any concerning symptoms in the interval.  The patient voices understanding of current disease status and treatment options and is in agreement with the current care plan.  All questions were answered. The patient knows to call the clinic with any problems, questions or concerns. We can certainly see the  patient much sooner if necessary.  The total time spent in the appointment was 30 minutes.  Disclaimer: This note was dictated with voice recognition software. Similar sounding words can inadvertently be transcribed and may not be corrected upon review.

## 2022-10-09 NOTE — Progress Notes (Signed)
Patient's son is aware of blood transfusion scheduled for 7/5 at 1500 and says patient will have transportation. He verbalized understanding that patient must keep her blue armband on in order to have blood transfusion.

## 2022-10-09 NOTE — Patient Instructions (Signed)
[facility]  Discharge Instructions: Thank you for choosing [facility] to provide your oncology and hematology care.   If you have a lab appointment with the Cancer Center, please go directly to the Cancer Center and check in at the registration area.   Wear comfortable clothing and clothing appropriate for easy access to any Portacath or PICC line.   We strive to give you quality time with your provider. You may need to reschedule your appointment if you arrive late (15 or more minutes).  Arriving late affects you and other patients whose appointments are after yours.  Also, if you miss three or more appointments without notifying the office, you may be dismissed from the clinic at the provider's discretion.      For prescription refill requests, have your pharmacy contact our office and allow 72 hours for refills to be completed.    Today you received the following chemotherapy and/or immunotherapy agents Darzalex Faspro      To help prevent nausea and vomiting after your treatment, we encourage you to take your nausea medication as directed.  BELOW ARE SYMPTOMS THAT SHOULD BE REPORTED IMMEDIATELY: *FEVER GREATER THAN 100.4 F (38 C) OR HIGHER *CHILLS OR SWEATING *NAUSEA AND VOMITING THAT IS NOT CONTROLLED WITH YOUR NAUSEA MEDICATION *UNUSUAL SHORTNESS OF BREATH *UNUSUAL BRUISING OR BLEEDING *URINARY PROBLEMS (pain or burning when urinating, or frequent urination) *BOWEL PROBLEMS (unusual diarrhea, constipation, pain near the anus) TENDERNESS IN MOUTH AND THROAT WITH OR WITHOUT PRESENCE OF ULCERS (sore throat, sores in mouth, or a toothache) UNUSUAL RASH, SWELLING OR PAIN  UNUSUAL VAGINAL DISCHARGE OR ITCHING   Items with * indicate a potential emergency and should be followed up as soon as possible or go to the Emergency Department if any problems should occur.  Please show the CHEMOTHERAPY ALERT CARD or IMMUNOTHERAPY ALERT CARD at  check-in to the Emergency Department and triage nurse.  Should you have questions after your visit or need to cancel or reschedule your appointment, please contact [facility]  Dept: [phone]  and follow the prompts.  Office hours are 8:00 a.m. to 4:30 p.m. Monday - Friday. Please note that voicemails left after 4:00 p.m. may not be returned until the following business day.  We are closed weekends and major holidays. You have access to a nurse at all times for urgent questions. Please call the main number to the clinic Dept: [phone] and follow the prompts.   For any non-urgent questions, you may also contact your provider using MyChart. We now offer e-Visits for anyone 18 and older to request care online for non-urgent symptoms. For details visit [email or web address].   Also download the MyChart app! Go to the app store, search "MyChart", open the app, select [facility], and log in with your MyChart username and password.  

## 2022-10-09 NOTE — Progress Notes (Signed)
T and S and prepare released.

## 2022-10-10 ENCOUNTER — Encounter: Payer: Self-pay | Admitting: Physician Assistant

## 2022-10-10 ENCOUNTER — Encounter: Payer: Self-pay | Admitting: Internal Medicine

## 2022-10-10 ENCOUNTER — Encounter: Payer: Self-pay | Admitting: Nurse Practitioner

## 2022-10-11 ENCOUNTER — Ambulatory Visit: Payer: Medicare PPO | Admitting: Physician Assistant

## 2022-10-11 ENCOUNTER — Ambulatory Visit: Payer: Medicare PPO

## 2022-10-11 ENCOUNTER — Other Ambulatory Visit: Payer: Self-pay

## 2022-10-11 ENCOUNTER — Inpatient Hospital Stay: Payer: Medicare PPO

## 2022-10-11 ENCOUNTER — Other Ambulatory Visit: Payer: Medicare PPO

## 2022-10-11 DIAGNOSIS — C7951 Secondary malignant neoplasm of bone: Secondary | ICD-10-CM | POA: Diagnosis not present

## 2022-10-11 DIAGNOSIS — I824Z2 Acute embolism and thrombosis of unspecified deep veins of left distal lower extremity: Secondary | ICD-10-CM | POA: Diagnosis not present

## 2022-10-11 DIAGNOSIS — Z79899 Other long term (current) drug therapy: Secondary | ICD-10-CM | POA: Diagnosis not present

## 2022-10-11 DIAGNOSIS — D630 Anemia in neoplastic disease: Secondary | ICD-10-CM | POA: Diagnosis not present

## 2022-10-11 DIAGNOSIS — Z7961 Long term (current) use of immunomodulator: Secondary | ICD-10-CM | POA: Diagnosis not present

## 2022-10-11 DIAGNOSIS — I129 Hypertensive chronic kidney disease with stage 1 through stage 4 chronic kidney disease, or unspecified chronic kidney disease: Secondary | ICD-10-CM | POA: Diagnosis not present

## 2022-10-11 DIAGNOSIS — D649 Anemia, unspecified: Secondary | ICD-10-CM

## 2022-10-11 DIAGNOSIS — E43 Unspecified severe protein-calorie malnutrition: Secondary | ICD-10-CM | POA: Diagnosis not present

## 2022-10-11 DIAGNOSIS — Z79624 Long term (current) use of inhibitors of nucleotide synthesis: Secondary | ICD-10-CM | POA: Diagnosis not present

## 2022-10-11 DIAGNOSIS — Z5112 Encounter for antineoplastic immunotherapy: Secondary | ICD-10-CM | POA: Diagnosis not present

## 2022-10-11 DIAGNOSIS — G893 Neoplasm related pain (acute) (chronic): Secondary | ICD-10-CM | POA: Diagnosis not present

## 2022-10-11 DIAGNOSIS — I272 Pulmonary hypertension, unspecified: Secondary | ICD-10-CM | POA: Diagnosis not present

## 2022-10-11 DIAGNOSIS — L89154 Pressure ulcer of sacral region, stage 4: Secondary | ICD-10-CM | POA: Diagnosis not present

## 2022-10-11 DIAGNOSIS — Z7952 Long term (current) use of systemic steroids: Secondary | ICD-10-CM | POA: Diagnosis not present

## 2022-10-11 DIAGNOSIS — M8580 Other specified disorders of bone density and structure, unspecified site: Secondary | ICD-10-CM | POA: Diagnosis not present

## 2022-10-11 DIAGNOSIS — C9000 Multiple myeloma not having achieved remission: Secondary | ICD-10-CM | POA: Diagnosis not present

## 2022-10-11 MED ORDER — SODIUM CHLORIDE 0.9% IV SOLUTION
250.0000 mL | Freq: Once | INTRAVENOUS | Status: AC
  Administered 2022-10-11: 250 mL via INTRAVENOUS

## 2022-10-11 MED ORDER — ACETAMINOPHEN 325 MG PO TABS
650.0000 mg | ORAL_TABLET | Freq: Once | ORAL | Status: AC
  Administered 2022-10-11: 650 mg via ORAL
  Filled 2022-10-11: qty 2

## 2022-10-11 MED ORDER — DIPHENHYDRAMINE HCL 25 MG PO CAPS
25.0000 mg | ORAL_CAPSULE | Freq: Once | ORAL | Status: AC
  Administered 2022-10-11: 25 mg via ORAL
  Filled 2022-10-11: qty 1

## 2022-10-11 NOTE — Patient Instructions (Signed)
Blood Transfusion, Adult, Care After The following information offers guidance on how to care for yourself after your procedure. Your health care provider may also give you more specific instructions. If you have problems or questions, contact your health care provider. What can I expect after the procedure? After the procedure, it is common to have: Bruising and soreness where the IV was inserted. A headache. Follow these instructions at home: IV insertion site care     Follow instructions from your health care provider about how to take care of your IV insertion site. Make sure you: Wash your hands with soap and water for at least 20 seconds before and after you change your bandage (dressing). If soap and water are not available, use hand sanitizer. Change your dressing as told by your health care provider. Check your IV insertion site every day for signs of infection. Check for: Redness, swelling, or pain. Bleeding from the site. Warmth. Pus or a bad smell. General instructions Take over-the-counter and prescription medicines only as told by your health care provider. Rest as told by your health care provider. Return to your normal activities as told by your health care provider. Keep all follow-up visits. Lab tests may need to be done at certain periods to recheck your blood counts. Contact a health care provider if: You have itching or red, swollen areas of skin (hives). You have a fever or chills. You have pain in the head, back, or chest. You feel anxious or you feel weak after doing your normal activities. You have redness, swelling, warmth, or pain around the IV insertion site. You have blood coming from the IV insertion site that does not stop with pressure. You have pus or a bad smell coming from your IV insertion site. If you received your blood transfusion in an outpatient setting, you will be told whom to contact to report any reactions. Get help right away if: You  have symptoms of a serious allergic or immune system reaction, including: Trouble breathing or shortness of breath. Swelling of the face, feeling flushed, or widespread rash. Dark urine or blood in the urine. Fast heartbeat. These symptoms may be an emergency. Get help right away. Call 911. Do not wait to see if the symptoms will go away. Do not drive yourself to the hospital. Summary Bruising and soreness around the IV insertion site are common. Check your IV insertion site every day for signs of infection. Rest as told by your health care provider. Return to your normal activities as told by your health care provider. Get help right away for symptoms of a serious allergic or immune system reaction to the blood transfusion. This information is not intended to replace advice given to you by your health care provider. Make sure you discuss any questions you have with your health care provider. Document Revised: 06/22/2021 Document Reviewed: 06/22/2021 Elsevier Patient Education  2024 Elsevier Inc.  

## 2022-10-14 DIAGNOSIS — C7951 Secondary malignant neoplasm of bone: Secondary | ICD-10-CM | POA: Diagnosis not present

## 2022-10-14 DIAGNOSIS — I129 Hypertensive chronic kidney disease with stage 1 through stage 4 chronic kidney disease, or unspecified chronic kidney disease: Secondary | ICD-10-CM | POA: Diagnosis not present

## 2022-10-14 DIAGNOSIS — G893 Neoplasm related pain (acute) (chronic): Secondary | ICD-10-CM | POA: Diagnosis not present

## 2022-10-14 DIAGNOSIS — I272 Pulmonary hypertension, unspecified: Secondary | ICD-10-CM | POA: Diagnosis not present

## 2022-10-14 DIAGNOSIS — C9000 Multiple myeloma not having achieved remission: Secondary | ICD-10-CM | POA: Diagnosis not present

## 2022-10-14 DIAGNOSIS — I824Z2 Acute embolism and thrombosis of unspecified deep veins of left distal lower extremity: Secondary | ICD-10-CM | POA: Diagnosis not present

## 2022-10-14 DIAGNOSIS — L89154 Pressure ulcer of sacral region, stage 4: Secondary | ICD-10-CM | POA: Diagnosis not present

## 2022-10-14 DIAGNOSIS — D630 Anemia in neoplastic disease: Secondary | ICD-10-CM | POA: Diagnosis not present

## 2022-10-14 DIAGNOSIS — E43 Unspecified severe protein-calorie malnutrition: Secondary | ICD-10-CM | POA: Diagnosis not present

## 2022-10-15 ENCOUNTER — Other Ambulatory Visit: Payer: Self-pay | Admitting: Medical Oncology

## 2022-10-15 DIAGNOSIS — L89154 Pressure ulcer of sacral region, stage 4: Secondary | ICD-10-CM | POA: Diagnosis not present

## 2022-10-15 DIAGNOSIS — C7951 Secondary malignant neoplasm of bone: Secondary | ICD-10-CM | POA: Diagnosis not present

## 2022-10-15 DIAGNOSIS — I129 Hypertensive chronic kidney disease with stage 1 through stage 4 chronic kidney disease, or unspecified chronic kidney disease: Secondary | ICD-10-CM | POA: Diagnosis not present

## 2022-10-15 DIAGNOSIS — I272 Pulmonary hypertension, unspecified: Secondary | ICD-10-CM | POA: Diagnosis not present

## 2022-10-15 DIAGNOSIS — G893 Neoplasm related pain (acute) (chronic): Secondary | ICD-10-CM | POA: Diagnosis not present

## 2022-10-15 DIAGNOSIS — E43 Unspecified severe protein-calorie malnutrition: Secondary | ICD-10-CM | POA: Diagnosis not present

## 2022-10-15 DIAGNOSIS — I824Z2 Acute embolism and thrombosis of unspecified deep veins of left distal lower extremity: Secondary | ICD-10-CM | POA: Diagnosis not present

## 2022-10-15 DIAGNOSIS — C9000 Multiple myeloma not having achieved remission: Secondary | ICD-10-CM | POA: Diagnosis not present

## 2022-10-15 DIAGNOSIS — D630 Anemia in neoplastic disease: Secondary | ICD-10-CM | POA: Diagnosis not present

## 2022-10-15 NOTE — Telephone Encounter (Signed)
Acyclovir- refilled by PCP.

## 2022-10-16 ENCOUNTER — Inpatient Hospital Stay: Payer: Medicare PPO

## 2022-10-16 ENCOUNTER — Telehealth: Payer: Self-pay | Admitting: Medical Oncology

## 2022-10-16 ENCOUNTER — Ambulatory Visit: Payer: Medicare PPO

## 2022-10-16 VITALS — BP 162/46 | HR 53 | Temp 97.7°F | Resp 18 | Wt 144.0 lb

## 2022-10-16 DIAGNOSIS — Z7952 Long term (current) use of systemic steroids: Secondary | ICD-10-CM | POA: Diagnosis not present

## 2022-10-16 DIAGNOSIS — M8580 Other specified disorders of bone density and structure, unspecified site: Secondary | ICD-10-CM | POA: Diagnosis not present

## 2022-10-16 DIAGNOSIS — Z5112 Encounter for antineoplastic immunotherapy: Secondary | ICD-10-CM | POA: Diagnosis not present

## 2022-10-16 DIAGNOSIS — Z79624 Long term (current) use of inhibitors of nucleotide synthesis: Secondary | ICD-10-CM | POA: Diagnosis not present

## 2022-10-16 DIAGNOSIS — D630 Anemia in neoplastic disease: Secondary | ICD-10-CM | POA: Diagnosis not present

## 2022-10-16 DIAGNOSIS — B370 Candidal stomatitis: Secondary | ICD-10-CM

## 2022-10-16 DIAGNOSIS — Z79899 Other long term (current) drug therapy: Secondary | ICD-10-CM | POA: Diagnosis not present

## 2022-10-16 DIAGNOSIS — C9000 Multiple myeloma not having achieved remission: Secondary | ICD-10-CM

## 2022-10-16 DIAGNOSIS — Z7961 Long term (current) use of immunomodulator: Secondary | ICD-10-CM | POA: Diagnosis not present

## 2022-10-16 LAB — CBC WITH DIFFERENTIAL (CANCER CENTER ONLY)
Abs Immature Granulocytes: 0.16 10*3/uL — ABNORMAL HIGH (ref 0.00–0.07)
Basophils Absolute: 0 10*3/uL (ref 0.0–0.1)
Basophils Relative: 0 %
Eosinophils Absolute: 0.4 10*3/uL (ref 0.0–0.5)
Eosinophils Relative: 4 %
HCT: 28.1 % — ABNORMAL LOW (ref 36.0–46.0)
Hemoglobin: 9.2 g/dL — ABNORMAL LOW (ref 12.0–15.0)
Immature Granulocytes: 2 %
Lymphocytes Relative: 13 %
Lymphs Abs: 1.3 10*3/uL (ref 0.7–4.0)
MCH: 31.4 pg (ref 26.0–34.0)
MCHC: 32.7 g/dL (ref 30.0–36.0)
MCV: 95.9 fL (ref 80.0–100.0)
Monocytes Absolute: 0.4 10*3/uL (ref 0.1–1.0)
Monocytes Relative: 4 %
Neutro Abs: 7.5 10*3/uL (ref 1.7–7.7)
Neutrophils Relative %: 77 %
Platelet Count: 156 10*3/uL (ref 150–400)
RBC: 2.93 MIL/uL — ABNORMAL LOW (ref 3.87–5.11)
RDW: 22.2 % — ABNORMAL HIGH (ref 11.5–15.5)
WBC Count: 9.9 10*3/uL (ref 4.0–10.5)
nRBC: 0 % (ref 0.0–0.2)

## 2022-10-16 LAB — CMP (CANCER CENTER ONLY)
ALT: 61 U/L — ABNORMAL HIGH (ref 0–44)
AST: 22 U/L (ref 15–41)
Albumin: 2.7 g/dL — ABNORMAL LOW (ref 3.5–5.0)
Alkaline Phosphatase: 397 U/L — ABNORMAL HIGH (ref 38–126)
Anion gap: 7 (ref 5–15)
BUN: 26 mg/dL — ABNORMAL HIGH (ref 8–23)
CO2: 27 mmol/L (ref 22–32)
Calcium: 8.2 mg/dL — ABNORMAL LOW (ref 8.9–10.3)
Chloride: 106 mmol/L (ref 98–111)
Creatinine: 0.72 mg/dL (ref 0.44–1.00)
GFR, Estimated: >60 mL/min (ref >60)
Glucose, Bld: 150 mg/dL — ABNORMAL HIGH (ref 70–99)
Potassium: 3.9 mmol/L (ref 3.5–5.1)
Sodium: 140 mmol/L (ref 135–145)
Total Bilirubin: 0.3 mg/dL (ref 0.3–1.2)
Total Protein: 5 g/dL — ABNORMAL LOW (ref 6.5–8.1)

## 2022-10-16 LAB — LACTATE DEHYDROGENASE: LDH: 250 U/L — ABNORMAL HIGH (ref 98–192)

## 2022-10-16 LAB — SAMPLE TO BLOOD BANK

## 2022-10-16 MED ORDER — DARATUMUMAB-HYALURONIDASE-FIHJ 1800-30000 MG-UT/15ML ~~LOC~~ SOLN
1800.0000 mg | Freq: Once | SUBCUTANEOUS | Status: AC
  Administered 2022-10-16: 1800 mg via SUBCUTANEOUS
  Filled 2022-10-16: qty 15

## 2022-10-16 MED ORDER — MAGIC MOUTHWASH
5.0000 mL | Freq: Four times a day (QID) | ORAL | 0 refills | Status: DC
Start: 2022-10-16 — End: 2023-01-01

## 2022-10-16 MED ORDER — ACETAMINOPHEN 325 MG PO TABS
650.0000 mg | ORAL_TABLET | Freq: Once | ORAL | Status: AC
  Administered 2022-10-16: 650 mg via ORAL
  Filled 2022-10-16: qty 2

## 2022-10-16 MED ORDER — DEXAMETHASONE 4 MG PO TABS
20.0000 mg | ORAL_TABLET | Freq: Once | ORAL | Status: AC
  Administered 2022-10-16: 20 mg via ORAL
  Filled 2022-10-16: qty 5

## 2022-10-16 MED ORDER — DIPHENHYDRAMINE HCL 25 MG PO CAPS
50.0000 mg | ORAL_CAPSULE | Freq: Once | ORAL | Status: AC
  Administered 2022-10-16: 50 mg via ORAL
  Filled 2022-10-16: qty 2

## 2022-10-16 NOTE — Patient Instructions (Signed)
[facility]  Discharge Instructions: Thank you for choosing [facility] to provide your oncology and hematology care.   If you have a lab appointment with the Cancer Center, please go directly to the Cancer Center and check in at the registration area.   Wear comfortable clothing and clothing appropriate for easy access to any Portacath or PICC line.   We strive to give you quality time with your provider. You may need to reschedule your appointment if you arrive late (15 or more minutes).  Arriving late affects you and other patients whose appointments are after yours.  Also, if you miss three or more appointments without notifying the office, you may be dismissed from the clinic at the provider's discretion.      For prescription refill requests, have your pharmacy contact our office and allow 72 hours for refills to be completed.    Today you received the following chemotherapy and/or immunotherapy agents Darzalex Faspro      To help prevent nausea and vomiting after your treatment, we encourage you to take your nausea medication as directed.  BELOW ARE SYMPTOMS THAT SHOULD BE REPORTED IMMEDIATELY: *FEVER GREATER THAN 100.4 F (38 C) OR HIGHER *CHILLS OR SWEATING *NAUSEA AND VOMITING THAT IS NOT CONTROLLED WITH YOUR NAUSEA MEDICATION *UNUSUAL SHORTNESS OF BREATH *UNUSUAL BRUISING OR BLEEDING *URINARY PROBLEMS (pain or burning when urinating, or frequent urination) *BOWEL PROBLEMS (unusual diarrhea, constipation, pain near the anus) TENDERNESS IN MOUTH AND THROAT WITH OR WITHOUT PRESENCE OF ULCERS (sore throat, sores in mouth, or a toothache) UNUSUAL RASH, SWELLING OR PAIN  UNUSUAL VAGINAL DISCHARGE OR ITCHING   Items with * indicate a potential emergency and should be followed up as soon as possible or go to the Emergency Department if any problems should occur.  Please show the CHEMOTHERAPY ALERT CARD or IMMUNOTHERAPY ALERT CARD at  check-in to the Emergency Department and triage nurse.  Should you have questions after your visit or need to cancel or reschedule your appointment, please contact [facility]  Dept: [phone]  and follow the prompts.  Office hours are 8:00 a.m. to 4:30 p.m. Monday - Friday. Please note that voicemails left after 4:00 p.m. may not be returned until the following business day.  We are closed weekends and major holidays. You have access to a nurse at all times for urgent questions. Please call the main number to the clinic Dept: [phone] and follow the prompts.   For any non-urgent questions, you may also contact your provider using MyChart. We now offer e-Visits for anyone 18 and older to request care online for non-urgent symptoms. For details visit [email or web address].   Also download the MyChart app! Go to the app store, search "MyChart", open the app, select [place], and log in with your MyChart username and password.  

## 2022-10-16 NOTE — Telephone Encounter (Signed)
Son has questions  How will her treatment response be measured?  How often ?  Requests refill of MMW.  Nurse looked @ pt tongue and it has yellow brown coating. Pt on weekly steroids. Pt denies oral pain or interfering with eating.  MMW rx sent to [facility] for authorization.  Fluid build up in feet-.Mild swelling -able to wear her shoes.  PCP instructed pt to get TED hose. Son told me she is elevating her feet , getting gentle exercise with PT and walking outside for short distance.Pt is receiving Darzelex which can cause swelling in ankles and feet.

## 2022-10-17 ENCOUNTER — Ambulatory Visit: Payer: Medicare PPO

## 2022-10-17 ENCOUNTER — Telehealth: Payer: Self-pay | Admitting: Medical Oncology

## 2022-10-17 ENCOUNTER — Other Ambulatory Visit: Payer: Medicare PPO

## 2022-10-17 DIAGNOSIS — C9000 Multiple myeloma not having achieved remission: Secondary | ICD-10-CM | POA: Diagnosis not present

## 2022-10-17 DIAGNOSIS — I129 Hypertensive chronic kidney disease with stage 1 through stage 4 chronic kidney disease, or unspecified chronic kidney disease: Secondary | ICD-10-CM | POA: Diagnosis not present

## 2022-10-17 DIAGNOSIS — L89154 Pressure ulcer of sacral region, stage 4: Secondary | ICD-10-CM | POA: Diagnosis not present

## 2022-10-17 DIAGNOSIS — I824Z2 Acute embolism and thrombosis of unspecified deep veins of left distal lower extremity: Secondary | ICD-10-CM | POA: Diagnosis not present

## 2022-10-17 DIAGNOSIS — E43 Unspecified severe protein-calorie malnutrition: Secondary | ICD-10-CM | POA: Diagnosis not present

## 2022-10-17 DIAGNOSIS — G893 Neoplasm related pain (acute) (chronic): Secondary | ICD-10-CM | POA: Diagnosis not present

## 2022-10-17 DIAGNOSIS — I272 Pulmonary hypertension, unspecified: Secondary | ICD-10-CM | POA: Diagnosis not present

## 2022-10-17 DIAGNOSIS — D630 Anemia in neoplastic disease: Secondary | ICD-10-CM | POA: Diagnosis not present

## 2022-10-17 DIAGNOSIS — C7951 Secondary malignant neoplasm of bone: Secondary | ICD-10-CM | POA: Diagnosis not present

## 2022-10-17 NOTE — Telephone Encounter (Signed)
I told [name] , that Namiko's her multiple myeloma disease will be measured by the protein study after 3-4 cycles of treatment.    He is going to call her PCP for compression stockings.

## 2022-10-18 ENCOUNTER — Ambulatory Visit: Payer: Medicare PPO

## 2022-10-18 ENCOUNTER — Other Ambulatory Visit: Payer: Medicare PPO

## 2022-10-18 DIAGNOSIS — D630 Anemia in neoplastic disease: Secondary | ICD-10-CM | POA: Diagnosis not present

## 2022-10-18 DIAGNOSIS — G893 Neoplasm related pain (acute) (chronic): Secondary | ICD-10-CM | POA: Diagnosis not present

## 2022-10-18 DIAGNOSIS — L89154 Pressure ulcer of sacral region, stage 4: Secondary | ICD-10-CM | POA: Diagnosis not present

## 2022-10-18 DIAGNOSIS — I824Z2 Acute embolism and thrombosis of unspecified deep veins of left distal lower extremity: Secondary | ICD-10-CM | POA: Diagnosis not present

## 2022-10-18 DIAGNOSIS — E43 Unspecified severe protein-calorie malnutrition: Secondary | ICD-10-CM | POA: Diagnosis not present

## 2022-10-18 DIAGNOSIS — C7951 Secondary malignant neoplasm of bone: Secondary | ICD-10-CM | POA: Diagnosis not present

## 2022-10-18 DIAGNOSIS — C9000 Multiple myeloma not having achieved remission: Secondary | ICD-10-CM | POA: Diagnosis not present

## 2022-10-18 DIAGNOSIS — I129 Hypertensive chronic kidney disease with stage 1 through stage 4 chronic kidney disease, or unspecified chronic kidney disease: Secondary | ICD-10-CM | POA: Diagnosis not present

## 2022-10-18 DIAGNOSIS — I272 Pulmonary hypertension, unspecified: Secondary | ICD-10-CM | POA: Diagnosis not present

## 2022-10-21 ENCOUNTER — Encounter: Payer: Medicare PPO | Attending: Physician Assistant | Admitting: Physician Assistant

## 2022-10-21 DIAGNOSIS — C7951 Secondary malignant neoplasm of bone: Secondary | ICD-10-CM | POA: Diagnosis not present

## 2022-10-21 DIAGNOSIS — B372 Candidiasis of skin and nail: Secondary | ICD-10-CM | POA: Insufficient documentation

## 2022-10-21 DIAGNOSIS — E43 Unspecified severe protein-calorie malnutrition: Secondary | ICD-10-CM | POA: Diagnosis not present

## 2022-10-21 DIAGNOSIS — C9000 Multiple myeloma not having achieved remission: Secondary | ICD-10-CM | POA: Insufficient documentation

## 2022-10-21 DIAGNOSIS — I272 Pulmonary hypertension, unspecified: Secondary | ICD-10-CM | POA: Diagnosis not present

## 2022-10-21 DIAGNOSIS — L89154 Pressure ulcer of sacral region, stage 4: Secondary | ICD-10-CM | POA: Insufficient documentation

## 2022-10-21 DIAGNOSIS — D630 Anemia in neoplastic disease: Secondary | ICD-10-CM | POA: Diagnosis not present

## 2022-10-21 DIAGNOSIS — G893 Neoplasm related pain (acute) (chronic): Secondary | ICD-10-CM | POA: Diagnosis not present

## 2022-10-21 DIAGNOSIS — I10 Essential (primary) hypertension: Secondary | ICD-10-CM | POA: Diagnosis not present

## 2022-10-21 DIAGNOSIS — I824Z2 Acute embolism and thrombosis of unspecified deep veins of left distal lower extremity: Secondary | ICD-10-CM | POA: Diagnosis not present

## 2022-10-21 DIAGNOSIS — I129 Hypertensive chronic kidney disease with stage 1 through stage 4 chronic kidney disease, or unspecified chronic kidney disease: Secondary | ICD-10-CM | POA: Diagnosis not present

## 2022-10-21 NOTE — Progress Notes (Signed)
[name], [name] ([id number]) [id number] Page 1 of 5 Visit Report for 10/21/2022 Abuse Risk Screen Details Patient Name: Date of Service: [name], [name] 10/21/2022 2:00 PM Medical Record Number: [id number] Patient Account Number: [id number] Date of Birth/Sex: Treating RN: 1940-12-09 (82 y.o. [name] Primary Care [name]: [name] Other Clinician: Referring [name]: Treating [name]/Extender: [name] Weeks in [name]: 0 Abuse Risk Screen Items Answer Electronic Signature(s) Signed: 10/21/2022 4:39:16 PM By: [name] Aver MSN RN CNS WTA Entered By: [name] on 10/21/2022 14:23:05 -------------------------------------------------------------------------------- Activities of Daily Living Details Patient Name: Date of Service: [name], [name] 10/21/2022 2:00 PM Medical Record Number: [id number] Patient Account Number: [id number] Date of Birth/Sex: Treating RN: [birth year] (82 y.o. [name] Primary Care [name]: [name] Other Clinician: Referring [name]: Treating [name]/Extender: [name] Weeks in [name]: 0 Activities of Daily Living Items Answer Activities of Daily Living (Please select one for each item) Drive Automobile Not Able T Medications ake Need Assistance Use T elephone Completely Able Care for Appearance Need Assistance Use T oilet Need Assistance Bath / Shower Need Assistance Dress Self Need Assistance Feed Self Need Assistance Walk Need Assistance Get In / Out Bed Need Assistance Housework Not Able Prepare Meals Not Able Handle Money Not Able Shop for Self Not Bee [place], [place] [name] ([id number]) [id number] [id number].pdf Page 2 of 5 Electronic Signature(s) Signed: 10/21/2022 4:39:16 PM By: [name] Aver MSN RN CNS WTA Entered By: [name] on 10/21/2022  14:23:53 -------------------------------------------------------------------------------- Education Screening Details Patient Name: Date of Service: [name] Number [name] 10/21/2022 2:00 PM Medical Record Number: [id number] Patient Account Number: [id number] Date of Birth/Sex: Treating RN: Oct 29, 1940 (82 y.o. [name] Primary Care Christy Ehrsam: [name] Other Clinician: Referring [name]: Treating [name]/Extender: [name] Weeks in [name]: 0 Learning Preferences/Education Level/Primary Language Preferred Language: English Cognitive Barrier Language Barrier: No Translator Needed: No Memory Deficit: No Emotional Barrier: No Cultural/Religious Beliefs Affecting Medical Care: No Physical Barrier Impaired Vision: Yes Impaired Hearing: No Decreased Hand dexterity: No Knowledge/Comprehension Knowledge Level: High Comprehension Level: High Ability to understand written instructions: High Ability to understand verbal instructions: High Motivation Anxiety Level: Calm Cooperation: Cooperative Education Importance: Acknowledges Need Interest in Health Problems: Asks Questions Perception: Coherent Willingness to Engage in Self-Management High Activities: Readiness to Engage in Self-Management High Activities: Electronic Signature(s) Signed: 10/21/2022 4:39:16 PM By: [name] Aver MSN RN CNS WTA Entered By: [name] on 10/21/2022 14:24:24 [name], [name] ([id number]) [id number] [id number].pdf Page 3 of 5 -------------------------------------------------------------------------------- Fall Risk Assessment Details Patient Name: Date of Service: [name], [name] 10/21/2022 2:00 PM Medical Record Number: [id number] Patient Account Number: [id number] Date of Birth/Sex: Treating RN: [birth year] (82 y.o. [name] Primary Care Shemica Meath: [name] Other Clinician: Referring [name]: Treating [name]/Extender: [name] Weeks  in [name]: 0 Fall Risk Assessment Items Have you had 2 or more falls in the last 12 monthso 0 No Have you had any fall that resulted in injury in the last 12 monthso 0 No FALLS RISK SCREEN History of falling - immediate or within 3 months 0 No Secondary diagnosis (Do you have 2 or more medical diagnoseso) 0 No Ambulatory aid None/bed rest/wheelchair/nurse 0 No Crutches/cane/walker 15 Yes Furniture 0 No Intravenous therapy Access/Saline/Heparin Lock 0 No Gait/Transferring Normal/ bed rest/ wheelchair 0 No Weak (short steps with or without shuffle, stooped but able to lift head while walking, may seek 0  No support from furniture) Impaired (short steps with shuffle, may have difficulty arising from chair, head down, impaired 0 No balance) Mental Status Oriented to own ability 0 Yes Electronic Signature(s) Signed: 10/21/2022 4:39:16 PM By: [name] Aver MSN RN CNS WTA Entered By: [name] on 10/21/2022 14:24:53 -------------------------------------------------------------------------------- Foot Assessment Details Patient Name: Date of Service: [name] Number [name] 10/21/2022 2:00 PM Medical Record Number: [id number] Patient Account Number: [id number] Date of Birth/Sex: Treating RN: 26-Aug-1940 (82 y.o. [name] Primary Care Tahjae Clausing: [name] Other Clinician: Referring Lujean Ebright: Treating [name]/Extender: [name] Weeks in [name]: 0 Foot Assessment Items Site Locations [place], [place] [name] ([id number]) [id number] [id number].pdf Page 4 of 5 + = Sensation present, - = Sensation absent, C = Callus, U = Ulcer R = Redness, W = Warmth, M = Maceration, PU = Pre-ulcerative lesion F = Fissure, S = Swelling, D = Dryness Assessment Right: Left: Other Deformity: No No Prior Foot Ulcer: No No Prior Amputation: No No Charcot Joint: No No Ambulatory Status: Gait: Electronic Signature(s) Signed: 10/21/2022 4:39:16 PM By: [name] Aver MSN RN CNS  WTA Entered By: [name] on 10/21/2022 14:25:38 -------------------------------------------------------------------------------- Nutrition Risk Screening Details Patient Name: Date of Service: [name], [name] 10/21/2022 2:00 PM Medical Record Number: [id number] Patient Account Number: [id number] Date of Birth/Sex: Treating RN: Feb 19, 1941 (82 y.o. [name] Primary Care Algie Westry: [name] Other Clinician: Referring Chanita Boden: Treating Sheena Simonis/Extender: [name] Weeks in [name]: 0 Height (in): Weight (lbs): Body Mass Index (BMI): Nutrition Risk Screening Items Score Screening NUTRITION RISK SCREEN: I have an illness or condition that made me change the kind and/or amount of food I eat 0 No I eat fewer than two meals per day 0 No I eat few fruits and vegetables, or milk products 0 No I have three or more drinks of beer, liquor or wine almost every day 0 No I have tooth or mouth problems that make it hard for me to eat 0 No I don't always have enough money to buy the food I need 0 No [name], [name] ([id number]) [id number] [id number].pdf Page 5 of 5 I eat alone most of the time 0 No I take three or more different prescribed or over-the-counter drugs a day 1 Yes Without wanting to, I have lost or gained 10 pounds in the last six months 0 No I am not always physically able to shop, cook and/or feed myself 0 No Nutrition Protocols Good Risk Protocol 0 No interventions needed Moderate Risk Protocol High Risk Proctocol Risk Level: Good Risk Score: 1 Electronic Signature(s) Signed: 10/21/2022 4:39:16 PM By: [name] Aver MSN RN CNS WTA Entered By: [name] on 10/21/2022 14:25:30

## 2022-10-21 NOTE — Progress Notes (Signed)
Pfeifle, Zuzu [name] ([id number]) [id number] Page 1 of 10 Visit Report for 10/21/2022 Allergy List Details Patient Name: Date of Service: [name], [name] 10/21/2022 2:00 PM Medical Record Number: [id number] Patient Account Number: [id number] Date of Birth/Sex: Treating RN: [birth year] (82 y.o. [name] Primary Care Sarahgrace Broman: Jacolyn Reedy Other Clinician: Referring Terrill Wauters: Treating Kindal Ponti/Extender: Allen Derry RA Rocky Crafts, [name] Weeks in Treatment: 0 Allergies Active Allergies vancomycin lactose Lipitor tramadol pravastatin Zetia codeine Allergy Notes Electronic Signature(s) Signed: 10/21/2022 4:39:16 PM By: Midge Aver MSN RN CNS WTA Entered By: Midge Aver on 10/21/2022 14:55:46 -------------------------------------------------------------------------------- Arrival Information Details Patient Name: Date of Service: [name] 10/21/2022 2:00 PM Medical Record Number: [id number] Patient Account Number: [id number] Date of Birth/Sex: Treating RN: January 07, 1941 (82 y.o. [name] Primary Care Fantasia Jinkins: Jacolyn Reedy Other Clinician: Referring Anndee Connett: Treating Kele Withem/Extender: Starling Manns, [name] Weeks in Treatment: 0 Visit Information Patient Arrived: [facility] Arrival Time: 14:16 Labree, Akanksha [name] ([id number]) [id number] Page 2 of 10 Accompanied By: son daughter in law and caregiver Transfer Assistance: None Patient Identification Verified: Yes Secondary Verification Process Completed: Yes Patient Requires Transmission-Based No Precautions: Patient Has Alerts: Yes Patient Alerts: Not Diabetic Electronic Signature(s) Signed: 10/21/2022 4:39:16 PM By: Midge Aver MSN RN CNS WTA Entered By: Midge Aver on 10/21/2022 14:16:55 -------------------------------------------------------------------------------- Clinic Level of Care Assessment Details Patient Name: Date of Service: [name], [name] 10/21/2022 2:00  PM Medical Record Number: [id number] Patient Account Number: [id number] Date of Birth/Sex: Treating RN: 1941/03/10 (82 y.o. [name] Primary Care Cordia Miklos: Jacolyn Reedy Other Clinician: Referring Dason Mosley: Treating Stefani Baik/Extender: Allen Derry RA Rocky Crafts, [name] Weeks in Treatment: 0 Clinic Level of Care Assessment Items TOOL 2 Quantity Score X- 1 0 Use when only an EandM is performed on the INITIAL visit ASSESSMENTS - Nursing Assessment / Reassessment X- 1 20 General Physical Exam (combine w/ comprehensive assessment (listed just below) when performed on new pt. evals) X- 1 25 Comprehensive Assessment (HX, ROS, Risk Assessments, Wounds Hx, etc.) ASSESSMENTS - Wound and Skin A ssessment / Reassessment X - Simple Wound Assessment / Reassessment - one wound 1 5 []  - 0 Complex Wound Assessment / Reassessment - multiple wounds []  - 0 Dermatologic / Skin Assessment (not related to wound area) ASSESSMENTS - Ostomy and/or Continence Assessment and Care []  - 0 Incontinence Assessment and Management []  - 0 Ostomy Care Assessment and Management (repouching, etc.) PROCESS - Coordination of Care X - Simple Patient / Family Education for ongoing care 1 15 []  - 0 Complex (extensive) Patient / Family Education for ongoing care X- 1 10 Staff obtains Chiropractor, Records, T Results / Process Orders est []  - 0 Staff telephones HHA, Nursing Homes / Clarify orders / etc []  - 0 Routine Transfer to another Facility (non-emergent condition) []  - 0 Routine Hospital Admission (non-emergent condition) X- 1 15 New Admissions / Manufacturing engineer / Ordering NPWT Apligraf, etc. , []  - 0 Emergency Hospital Admission (emergent condition) X- 1 10 Simple Discharge Coordination []  - 0 Complex (extensive) Discharge Coordination Rao, Delonna [name] ([id number]) [id number] Page 3 of 10 PROCESS - Special Needs []  - 0 Pediatric / Minor Patient Management []  - 0 Isolation  Patient Management []  - 0 Hearing / Language / Visual special needs []  - 0 Assessment of Community assistance (transportation, D/C planning, etc.) []  - 0 Additional assistance / Altered mentation []  - 0 Support Surface(s) Assessment (bed, cushion, seat, etc.) INTERVENTIONS - Wound Cleansing / Measurement X- 1 5  Wound Imaging (photographs - any number of wounds) []  - 0 Wound Tracing (instead of photographs) X- 1 5 Simple Wound Measurement - one wound []  - 0 Complex Wound Measurement - multiple wounds X- 1 5 Simple Wound Cleansing - one wound []  - 0 Complex Wound Cleansing - multiple wounds INTERVENTIONS - Wound Dressings []  - 0 Small Wound Dressing one or multiple wounds []  - 0 Medium Wound Dressing one or multiple wounds X- 1 20 Large Wound Dressing one or multiple wounds []  - 0 Application of Medications - injection INTERVENTIONS - Miscellaneous []  - 0 External ear exam []  - 0 Specimen Collection (cultures, biopsies, blood, body fluids, etc.) []  - 0 Specimen(s) / Culture(s) sent or taken to Lab for analysis []  - 0 Patient Transfer (multiple staff / Nurse, adult / Similar devices) []  - 0 Simple Staple / Suture removal (25 or less) []  - 0 Complex Staple / Suture removal (26 or more) []  - 0 Hypo / Hyperglycemic Management (close monitor of Blood Glucose) []  - 0 Ankle / Brachial Index (ABI) - do not check if billed separately Has the patient been seen at the hospital within the last three years: Yes Total Score: 135 Level Of Care: New/Established - Level 4 Electronic Signature(s) Unsigned Previous Signature: 10/21/2022 4:39:16 PM Version By: Midge Aver MSN RN CNS WTA Entered By: Midge Aver on 10/23/2022 09:19:22 -------------------------------------------------------------------------------- Encounter Discharge Information Details Patient Name: Date of Service: [name] 10/21/2022 2:00 PM Medical Record Number: [id number] Patient Account Number:  [id number] [name], [name] ([id number]) [id number] Page 4 of 10 Date of Birth/Sex: Treating RN: 1940/05/02 (82 y.o. [name] Primary Care Compton Brigance: Other Clinician: Jacolyn Reedy Referring Ashir Kunz: Treating Letty Salvi/Extender: Allen Derry RA Rocky Crafts, [name] Weeks in Treatment: 0 Encounter Discharge Information Items Discharge Condition: Stable Ambulatory Status: Walker Discharge Destination: Home Transportation: Private Auto Accompanied By: son Schedule Follow-up Appointment: Yes Clinical Summary of Care: Electronic Signature(s) Signed: 10/23/2022 9:35:58 AM By: Midge Aver MSN RN CNS WTA Previous Signature: 10/23/2022 9:20:43 AM Version By: Midge Aver MSN RN CNS WTA Previous Signature: 10/21/2022 3:26:36 PM Version By: Midge Aver MSN RN CNS WTA Entered By: Midge Aver on 10/23/2022 09:35:57 -------------------------------------------------------------------------------- Lower Extremity Assessment Details Patient Name: Date of Service: [name] 10/21/2022 2:00 PM Medical Record Number: [id number] Patient Account Number: [id number] Date of Birth/Sex: Treating RN: 11/19/1940 (82 y.o. [name] Primary Care Zackari Ruane: Jacolyn Reedy Other Clinician: Referring Kennady Zimmerle: Treating Hulen Mandler/Extender: Allen Derry RA Rocky Crafts, [name] Weeks in Treatment: 0 Electronic Signature(s) Signed: 10/21/2022 4:39:16 PM By: Midge Aver MSN RN CNS WTA Entered By: Midge Aver on 10/21/2022 14:53:49 -------------------------------------------------------------------------------- Multi Wound Chart Details Patient Name: Date of Service: [name] 10/21/2022 2:00 PM Medical Record Number: [id number] Patient Account Number: [id number] Date of Birth/Sex: Treating RN: [birth year] (82 y.o. [name] Primary Care Khameron Gruenwald: Jacolyn Reedy Other Clinician: Referring Robert Sunga: Treating Quency Tober/Extender: Allen Derry RA Rocky Crafts, [name] Weeks in Treatment: 0 Vital  Signs Height(in): Pulse(bpm): 67 Weight(lbs): Blood Pressure(mmHg): 161/56 Body Mass Index(BMI): Temperature(F): 98.3 Rabenold, Mayara [name] ([id number]) [id number] Page 5 of 10 Respiratory Rate(breaths/min): 18 [1:Photos:] [N/A:N/A] Midline Coccyx N/A N/A Wound Location: Pressure Injury N/A N/A Wounding Event: Pressure Ulcer N/A N/A Primary Etiology: Hypertension, Received N/A N/A Comorbid History: Chemotherapy 08/30/2022 N/A N/A Date Acquired: 0 N/A N/A Weeks of Treatment: Open N/A N/A Wound Status: No N/A N/A Wound Recurrence: 8.5x6.5x2.5 N/A N/A Measurements L x W x D (cm) 43.393 N/A N/A A (cm) :  rea 42.483 N/A N/A Volume (cm) : 11 Starting Position 1 (o'clock): 1 Ending Position 1 (o'clock): 1 Maximum Distance 1 (cm): Yes N/A N/A Undermining: Category/Stage IV N/A N/A Classification: Medium N/A N/A Exudate A mount: Serosanguineous N/A N/A Exudate Type: red, brown N/A N/A Exudate Color: Medium (34-66%) N/A N/A Granulation A mount: Red, Pink N/A N/A Granulation Quality: None Present (0%) N/A N/A Necrotic A mount: Fat Layer (Subcutaneous Tissue): Yes N/A N/A Exposed Structures: Bone: Yes Fascia: No Tendon: No Muscle: No Joint: No Treatment Notes Wound #1 (Coccyx) Wound Laterality: Midline Cleanser Dakin 16 (oz) 0.25 Discharge Instruction: Use as directed. Soap and Water Discharge Instruction: Gently cleanse wound with antibacterial soap, rinse and pat dry prior to dressing wounds Peri-Wound Care Topical Primary Dressing Gauze Discharge Instruction: As directed: dry, moistened with saline or moistened with Dakins Solution Secondary Dressing Allevyn Adhesive Foam Sacrum Dressing, 6.75x6.75 (in/in) Secured With Compression Wrap Compression Stockings Add-Ons Electronic Signature(s) Signed: 10/21/2022 3:22:00 PM By: Midge Aver MSN RN CNS WTA Entered By: Midge Aver on 10/21/2022 15:22:00 Aker, Shanyia [name] ([id number])  [id number] Page 6 of 10 -------------------------------------------------------------------------------- Multi-Disciplinary Care Plan Details Patient Name: Date of Service: [name], [name] 10/21/2022 2:00 PM Medical Record Number: [id number] Patient Account Number: [id number] Date of Birth/Sex: Treating RN: 1941-01-14 (82 y.o. [name] Primary Care [place] Kappes: Jacolyn Reedy Other Clinician: Referring Samona Chihuahua: Treating Ardena Gangl/Extender: Allen Derry RA Rocky Crafts, [name] Weeks in Treatment: 0 Active Inactive Orientation to the Wound Care Program Nursing Diagnoses: Knowledge deficit related to the wound healing center program Goals: Patient/caregiver will verbalize understanding of the Wound Healing Center Program Date Initiated: 10/21/2022 Target Resolution Date: 10/28/2022 Goal Status: Active Interventions: Provide education on orientation to the wound center Notes: Pressure Nursing Diagnoses: Knowledge deficit related to causes and risk factors for pressure ulcer development Knowledge deficit related to management of pressures ulcers Goals: Patient will remain free from development of additional pressure ulcers Date Initiated: 10/21/2022 Target Resolution Date: 11/21/2022 Goal Status: Active Patient will remain free of pressure ulcers Date Initiated: 10/21/2022 Target Resolution Date: 11/21/2022 Goal Status: Active Patient/caregiver will verbalize risk factors for pressure ulcer development Date Initiated: 10/21/2022 Target Resolution Date: 11/21/2022 Goal Status: Active Patient/caregiver will verbalize understanding of pressure ulcer management Date Initiated: 10/21/2022 Target Resolution Date: 11/21/2022 Goal Status: Active Interventions: Assess: immobility, friction, shearing, incontinence upon admission and as needed Assess offloading mechanisms upon admission and as needed Assess potential for pressure ulcer upon admission and as needed Provide  education on pressure ulcers Notes: Wound/Skin Impairment Nursing Diagnoses: Impaired tissue integrity Knowledge deficit related to ulceration/compromised skin integrity Goals: Patient/caregiver will verbalize understanding of skin care regimen Date Initiated: 10/21/2022 Target Resolution Date: 11/21/2022 Shivali, Quackenbush Rayley [name] ([id number]) [id number] Page 7 of 10 Goal Status: Active Ulcer/skin breakdown will have a volume reduction of 30% by week 4 Date Initiated: 10/21/2022 Target Resolution Date: 11/21/2022 Goal Status: Active Ulcer/skin breakdown will have a volume reduction of 50% by week 8 Date Initiated: 10/21/2022 Target Resolution Date: 12/22/2022 Goal Status: Active Ulcer/skin breakdown will have a volume reduction of 80% by week 12 Date Initiated: 10/21/2022 Target Resolution Date: 01/21/2023 Goal Status: Active Ulcer/skin breakdown will heal within 14 weeks Date Initiated: 10/21/2022 Target Resolution Date: 02/04/2023 Goal Status: Active Interventions: Assess patient/caregiver ability to obtain necessary supplies Assess patient/caregiver ability to perform ulcer/skin care regimen upon admission and as needed Assess ulceration(s) every visit Provide education on ulcer and skin care Treatment Activities: Patient referred to home care : 10/21/2022 Skin care regimen  initiated : 10/21/2022 Topical wound management initiated : 10/21/2022 Notes: Electronic Signature(s) Signed: 10/21/2022 3:25:08 PM By: Midge Aver MSN RN CNS WTA Previous Signature: 10/21/2022 3:25:00 PM Version By: Midge Aver MSN RN CNS WTA Entered By: Midge Aver on 10/21/2022 15:25:07 -------------------------------------------------------------------------------- Pain Assessment Details Patient Name: Date of Service: [name] 10/21/2022 2:00 PM Medical Record Number: [id number] Patient Account Number: [id number] Date of Birth/Sex: Treating RN: June 03, 1940 (82 y.o. [name] Primary Care Rutledge Selsor: Jacolyn Reedy Other Clinician: Referring Maranda Marte: Treating Cutberto Winfree/Extender: Allen Derry RA Rocky Crafts, [name] Weeks in Treatment: 0 Active Problems Location of Pain Severity and Description of Pain Patient Has Paino No Site Locations [place], [place] [name] ([id number]) [id number].pdf Page 8 of 10 Pain Management and Medication Current Pain Management: Electronic Signature(s) Signed: 10/21/2022 4:39:16 PM By: Midge Aver MSN RN CNS WTA Entered By: Midge Aver on 10/21/2022 14:17:42 -------------------------------------------------------------------------------- Patient/Caregiver Education Details Patient Name: Date of Service: Myra Gianotti 7/15/2024andnbsp2:00 PM Medical Record Number: [id number] Patient Account Number: [id number] Date of Birth/Gender: Treating RN: [birth year] (82 y.o. [name] Primary Care Physician: Jacolyn Reedy Other Clinician: Referring Physician: Treating Physician/Extender: Allen Derry RA Nadene Rubins Weeks in Treatment: 0 Education Assessment Education Provided To: Patient and Caregiver Education Topics Provided Pressure: Handouts: Pressure Injury: Prevention and Offloading Methods: Explain/Verbal Responses: State content correctly Welcome T [facility] Packet: o Handouts: Nutrition, Welcome T [facility] o Methods: Explain/Verbal Responses: State content correctly Wound/Skin Impairment: Handouts: Caring for Your Ulcer Methods: Explain/Verbal Responses: State content correctly Electronic Signature(s) Signed: 10/21/2022 4:39:16 PM By: Midge Aver MSN RN CNS Thamas Jaegers, Satina [name] ([id number]) [phone].pdf Page 9 of 10 Signed: 10/21/2022 4:39:16 PM By: Midge Aver MSN RN CNS WTA Entered By: Midge Aver on 10/21/2022 15:25:57 -------------------------------------------------------------------------------- Wound Assessment Details Patient Name: Date of  Service: JEYDA, [name] 10/21/2022 2:00 PM Medical Record Number: [id number] Patient Account Number: [id number] Date of Birth/Sex: Treating RN: 1941-02-19 (82 y.o. [name] Primary Care Chantavia Bazzle: Jacolyn Reedy Other Clinician: Referring Burnie Therien: Treating Maddyn Lieurance/Extender: Allen Derry RA Rocky Crafts, [name] Weeks in Treatment: 0 Wound Status Wound Number: 1 Primary Etiology: Pressure Ulcer Wound Location: Midline Coccyx Wound Status: Open Wounding Event: Pressure Injury Comorbid History: Hypertension, Received Chemotherapy Date Acquired: 08/30/2022 Weeks Of Treatment: 0 Clustered Wound: No Photos Wound Measurements Length: (cm) 8.5 Width: (cm) 6.5 Depth: (cm) 2.5 Area: (cm) 43.393 Volume: (cm) 108.483 % Reduction in Area: % Reduction in Volume: Undermining: Yes Starting Position (o'clock): 11 Ending Position (o'clock): 1 Maximum Distance: (cm) 1 Wound Description Classification: Category/Stage IV Exudate Amount: Medium Exudate Type: Serosanguineous Exudate Color: red, brown Foul Odor After Cleansing: No Slough/Fibrino No Wound Bed Granulation Amount: Medium (34-66%) Exposed Structure Granulation Quality: Red, Pink Fascia Exposed: No Necrotic Amount: None Present (0%) Fat Layer (Subcutaneous Tissue) Exposed: Yes Tendon Exposed: No Muscle Exposed: No Joint Exposed: No Bone Exposed: Yes Treatment Notes Wound #1 (Coccyx) Wound Laterality: Midline Riede, Etola [name] ([id number]) [id number] Page 10 of 10 Cleanser Dakin 16 (oz) 0.25 Discharge Instruction: Use as directed. Soap and Water Discharge Instruction: Gently cleanse wound with antibacterial soap, rinse and pat dry prior to dressing wounds Peri-Wound Care Topical Gentamicin Discharge Instruction: Apply as directed by Marky Buresh. Primary Dressing Gauze Discharge Instruction: As directed: dry, moistened with saline or moistened with Dakins Solution Secondary Dressing Allevyn Adhesive Foam  Sacrum Dressing, 6.75x6.75 (in/in) Secured With Compression Wrap Compression Stockings Add-Ons Electronic Signature(s) Signed: 10/21/2022 4:39:16 PM By: Midge Aver MSN  RN CNS WTA Entered By: Midge Aver on 10/21/2022 14:53:38 -------------------------------------------------------------------------------- Vitals Details Patient Name: Date of Service: [name], [name] 10/21/2022 2:00 PM Medical Record Number: [id number] Patient Account Number: [id number] Date of Birth/Sex: Treating RN: 08/02/1940 (82 y.o. [name] Primary Care Allena Pietila: Jacolyn Reedy Other Clinician: Referring Aleni Andrus: Treating Leauna Sharber/Extender: Allen Derry RA Rocky Crafts, [name] Weeks in Treatment: 0 Vital Signs Time Taken: 14:17 Temperature (F): 98.3 Pulse (bpm): 67 Respiratory Rate (breaths/min): 18 Blood Pressure (mmHg): 161/56 Reference Range: 80 - 120 mg / dl Electronic Signature(s) Signed: 10/21/2022 4:39:16 PM By: Midge Aver MSN RN CNS WTA Entered By: Midge Aver on 10/21/2022 14:18:23

## 2022-10-22 ENCOUNTER — Other Ambulatory Visit: Payer: Self-pay | Admitting: *Deleted

## 2022-10-22 ENCOUNTER — Other Ambulatory Visit: Payer: Self-pay | Admitting: Internal Medicine

## 2022-10-22 DIAGNOSIS — L89154 Pressure ulcer of sacral region, stage 4: Secondary | ICD-10-CM | POA: Diagnosis not present

## 2022-10-22 DIAGNOSIS — C9000 Multiple myeloma not having achieved remission: Secondary | ICD-10-CM

## 2022-10-22 DIAGNOSIS — E43 Unspecified severe protein-calorie malnutrition: Secondary | ICD-10-CM | POA: Diagnosis not present

## 2022-10-22 DIAGNOSIS — I129 Hypertensive chronic kidney disease with stage 1 through stage 4 chronic kidney disease, or unspecified chronic kidney disease: Secondary | ICD-10-CM | POA: Diagnosis not present

## 2022-10-22 DIAGNOSIS — G893 Neoplasm related pain (acute) (chronic): Secondary | ICD-10-CM | POA: Diagnosis not present

## 2022-10-22 DIAGNOSIS — C7951 Secondary malignant neoplasm of bone: Secondary | ICD-10-CM | POA: Diagnosis not present

## 2022-10-22 DIAGNOSIS — I824Z2 Acute embolism and thrombosis of unspecified deep veins of left distal lower extremity: Secondary | ICD-10-CM | POA: Diagnosis not present

## 2022-10-22 DIAGNOSIS — I272 Pulmonary hypertension, unspecified: Secondary | ICD-10-CM | POA: Diagnosis not present

## 2022-10-22 DIAGNOSIS — D630 Anemia in neoplastic disease: Secondary | ICD-10-CM | POA: Diagnosis not present

## 2022-10-23 ENCOUNTER — Ambulatory Visit: Payer: Medicare PPO

## 2022-10-23 NOTE — Progress Notes (Addendum)
KAZMIERSKI, Tessla B ([id number]) [id number] Page 1 of 7 Visit Report for 10/21/2022 Chief Complaint Document Details Patient Name: Date of Service: MCKINNEY, GLASS NN B. 10/21/2022 2:00 PM Medical Record Number: [id number] Patient Account Number: [id number] Date of Birth/Sex: Treating RN: 1940/06/15 (82 y.o. [name] Primary Care Provider: Jacolyn Reedy Other Clinician: Referring Provider: Treating Provider/Extender: Allen Derry RA Rocky Crafts, [name] Weeks in Treatment: 0 Information Obtained from: Patient Chief Complaint Stage 4 pressure ulcer Electronic Signature(s) Signed: 10/21/2022 6:02:27 PM By: Allen Derry PA-C Entered By: Allen Derry on 10/21/2022 18:02:27 -------------------------------------------------------------------------------- HPI Details Patient Name: Date of Service: Rich Number NN B. 10/21/2022 2:00 PM Medical Record Number: [id number] Patient Account Number: [id number] Date of Birth/Sex: Treating RN: 1940-12-28 (82 y.o. 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 Currently there is not direct bone exposure because she is now covered with granulation tissue her family has been taking care of her at home at this point although she was previously in a facility. This is something that was noted when she ended up having to go to the ER initially on Aug 30, 2022. Subsequently during hospital stent it was started that she had gentamicin applied topically along with Dakin's moistened gauze packing which has been done at home now and seems to be doing very well. She was at [facility] skilled nursing  facility for time. She did have Levaquin previous which she did do very well for her as far as get the infection under control in my opinion I think she is actually seeming to do quite well at this point. She is not having any pain which is also excellent news at this time. She is starting to get around more so she is not completely bedbound although she is a little weak she does have family that is very attentive and taking great care of her. 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 Electronic Signature(s) Signed: 10/23/2022 1:59:41 PM By: Allen Derry PA-C Previous Signature: 10/21/2022 6:00:59 PM Version By: Farrel Demark, Audery B ([id number]) [id number] Page 2 of 7 Entered By: Allen Derry on 10/23/2022 13:59:41 -------------------------------------------------------------------------------- Physical Exam Details Patient Name: Date of Service: PABLO, FERKO NN B. 10/21/2022 2:00 PM Medical Record Number: [id number] Patient Account Number: [id number] Date of Birth/Sex: Treating RN: [birth year] (82 y.o. [name] Primary Care Provider: Jacolyn Reedy Other Clinician: Referring Provider: Treating Provider/Extender: Allen Derry RA Rocky Crafts, [name] Weeks in Treatment: 0 Constitutional patient is hypertensive.. pulse regular and within target range for patient.Marland Kitchen respirations regular, non-labored and within target range for patient.Marland Kitchen temperature within target range for patient.. Well-nourished and well-hydrated in no acute distress. Eyes conjunctiva clear no eyelid edema noted. pupils equal round and reactive to light and accommodation. Ears, Nose, Mouth, and Throat no gross abnormality of ear auricles or external auditory canals. normal hearing noted during conversation. mucus membranes moist. Respiratory normal breathing without difficulty. Musculoskeletal Patient unable to walk without  assistance. no significant deformity or arthritic changes, no loss or range of motion, no clubbing. Psychiatric this patient is able to make decisions and demonstrates good insight into disease process. Alert and Oriented x 3.  pleasant and cooperative. Notes Upon inspection patient's wound bed actually showed signs of being fairly clean that they have been doing Dakin's moistened gauze packing daily and bupropion as this seems to have done extremely well. I think that she is tolerating the dressing changes without complication and they are changing this regularly its either her daughter-in-law or her caregiver who performed the dressing changes Home health is also coming out but again they do not come out every day. With all that being said I am very pleased that the patient seems to be doing very well at this point and I think that she is moving in the right direction as far as healing is concerned. Electronic Signature(s) Signed: 10/23/2022 2:00:18 PM By: Allen Derry PA-C Entered By: Allen Derry on 10/23/2022 14:00:18 -------------------------------------------------------------------------------- Physician Orders Details Patient Name: Date of Service: BORGHILD, WIDDER NN B. 10/21/2022 2:00 PM Medical Record Number: [id number] Patient Account Number: [id number] Date of Birth/Sex: Treating RN: 1940/12/21 (82 y.o. [name] Primary Care Provider: Jacolyn Reedy Other Clinician: Referring Provider: Treating Provider/Extender: Starling Manns, [name] Weeks in TreatmentBruce Donath, Elveria B ([id number]) [id number] Page 3 of 7 Verbal / Phone Orders: No Diagnosis Coding Follow-up Appointments Return Appointment in 1 week. Home Health [facility] for wound care. May utilize formulary equivalent dressing for wound treatment orders unless otherwise specified. Home Health Nurse may visit PRN to address patients wound care needs. - [facility]--please change dressing  with patient lying down in bed. Turn to the side of patient's choice. Clean wound with antibacterial soap and water or wound cleanser. Apply gentamycin ointment to surrounding area and then apply a gauze moistened (not soaked) with Dakin's solution. Cover with a dry gauze then secure with an Allevyn Sacral Foam Dressing. Bathing/ Shower/ Hygiene May shower; gently cleanse wound with antibacterial soap, rinse and pat dry prior to dressing wounds Off-Loading Low air-loss mattress (Group 2) Turn and reposition every 2 hours Wound Treatment Wound #1 - Coccyx Wound Laterality: Midline Cleanser: Dakin 16 (oz) 0.25 (Home Health) 1 x Per Day/30 Days Discharge Instructions: Use as directed. Cleanser: Soap and Water (Home Health) 1 x Per Day/30 Days Discharge Instructions: Gently cleanse wound with antibacterial soap, rinse and pat dry prior to dressing wounds Topical: Gentamicin (Generic) 1 x Per Day/30 Days Discharge Instructions: Apply as directed by provider. Prim Dressing: Gauze (Home Health) 1 x Per Day/30 Days ary Discharge Instructions: As directed: dry, moistened with saline or moistened with Dakins Solution Secondary Dressing: Allevyn Adhesive Foam Sacrum Dressing, 6.75x6.75 (in/in) (Home Health) 1 x Per Day/30 Days Electronic Signature(s) Signed: 10/23/2022 2:01:40 PM By: Allen Derry PA-C Signed: 10/24/2022 4:23:38 PM By: Midge Aver MSN RN CNS WTA Previous Signature: 10/21/2022 4:39:16 PM Version By: Midge Aver MSN RN CNS WTA Previous Signature: 10/22/2022 5:59:11 PM Version By: Allen Derry PA-C Entered By: Midge Aver on 10/23/2022 09:19:03 -------------------------------------------------------------------------------- Problem List Details Patient Name: Date of Service: Rich Number NN B. 10/21/2022 2:00 PM Medical Record Number: [id number] Patient Account Number: [id number] Date of Birth/Sex: Treating RN: [birth year] (82 y.o. [name] Primary Care Provider: Jacolyn Reedy Other  Clinician: Referring Provider: Treating Provider/Extender: Starling Manns, [name] Weeks in Treatment: 0 Active Problems ICD-10 Encounter Code Description Active Date MDM Diagnosis L89.154 Pressure ulcer of sacral region, stage 4 10/21/2022 No Yes Piedra, Jonathan B ([id number]) [id number] Page 4 of 7 M62.81 Muscle weakness (generalized) 10/21/2022 No Yes C90.00 Multiple myeloma not having achieved remission 10/21/2022 No Yes Inactive Problems  Resolved Problems Electronic Signature(s) Signed: 10/21/2022 6:02:10 PM By: Allen Derry PA-C Entered By: Allen Derry on 10/21/2022 18:02:10 -------------------------------------------------------------------------------- Progress Note Details Patient Name: Date of Service: [place], A NN B. 10/21/2022 2:00 PM Medical Record Number: [id number] Patient Account Number: [id number] Date of Birth/Sex: Treating RN: June 08, 1940 (82 y.o. 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 Currently there is not direct bone exposure because she is now covered with granulation tissue her family has been taking care of her at home at this point although she was previously in a facility. This is something that was noted when she ended up having to go to the ER initially on Aug 30, 2022. Subsequently during hospital stent it was started that she had gentamicin applied topically along with Dakin's moistened gauze packing which has been done at home now and seems to be doing very well. She was at [facility]  skilled nursing facility for time. She did have Levaquin previous which she did do very well for her as far as get the infection under control in my opinion I think she is actually seeming to do quite well at this point. She is not having any pain which is also excellent news at this time. She is starting to get around more so she is not completely bedbound although she is a little weak she does have family that is very attentive and taking great care of her. 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 Patient History Information obtained from Patient, Caregiver. Allergies vancomycin, lactose, Lipitor, tramadol, pravastatin, Zetia, codeine Social History Never smoker, Marital Status - Widowed, Alcohol Use - Never, Drug Use - No History, Caffeine Use - Never. Medical History Cardiovascular Patient has history of Hypertension Oncologic Patient has history of Received Chemotherapy Medical A Surgical History Notes nd Oncologic Multiple myeloma Review of Systems (ROS) Constitutional Symptoms (General Health) Lythgoe, Milayna B ([id number]) [id number] Page 5 of 7 Denies complaints or symptoms of Fatigue, Fever, Chills, Marked Weight Change. Eyes Complains or has symptoms of Glasses / Contacts. Psychiatric Denies complaints or symptoms of Anxiety, Claustrophobia. Objective Constitutional patient is hypertensive.. pulse regular and within target range for patient.Marland Kitchen respirations regular, non-labored and within target range for patient.Marland Kitchen temperature within target range for patient.. Well-nourished and well-hydrated in no acute distress. Vitals Time Taken: 2:17 PM, Temperature: 98.3 F, Pulse: 67 bpm, Respiratory Rate: 18 breaths/min, Blood Pressure: 161/56 mmHg. Eyes conjunctiva clear no eyelid edema noted. pupils equal round and reactive to light and accommodation. Ears, Nose, Mouth, and  Throat no gross abnormality of ear auricles or external auditory canals. normal hearing noted during conversation. mucus membranes moist. Respiratory normal breathing without difficulty. Musculoskeletal Patient unable to walk without assistance. no significant deformity or arthritic changes, no loss or range of motion, no clubbing. Psychiatric this patient is able to make decisions and demonstrates good insight into disease process. Alert and Oriented x 3. pleasant and cooperative. General Notes: Upon inspection patient's wound bed actually showed signs of being fairly clean that they have been doing Dakin's moistened gauze packing daily and bupropion as this seems to  have done extremely well. I think that she is tolerating the dressing changes without complication and they are changing this regularly its either her daughter-in-law or her caregiver who performed the dressing changes Home health is also coming out but again they do not come out every day. With all that being said I am very pleased that the patient seems to be doing very well at this point and I think that she is moving in the right direction as far as healing is concerned. Integumentary (Hair, Skin) Wound #1 status is Open. Original cause of wound was Pressure Injury. The date acquired was: 08/30/2022. The wound is located on the Midline Coccyx. The wound measures 8.5cm length x 6.5cm width x 2.5cm depth; 43.393cm^2 area and 108.483cm^3 volume. There is bone and Fat Layer (Subcutaneous Tissue) exposed. There is undermining starting at 11:00 and ending at 1:00 with a maximum distance of 1cm. There is a medium amount of serosanguineous drainage noted. There is medium (34-66%) red, pink granulation within the wound bed. There is no necrotic tissue within the wound bed. Assessment Active Problems ICD-10 Pressure ulcer of sacral region, stage 4 Muscle weakness (generalized) Multiple myeloma not having achieved  remission Plan Follow-up Appointments: Return Appointment in 1 week. Home Health: [facility] for wound care. May utilize formulary equivalent dressing for wound treatment orders unless otherwise specified. Home Health Nurse may visit PRN to address patients wound care needs. - [facility]--please change dressing with patient lying down in bed. Turn to the side of patient's choice. Clean wound with antibacterial soap and water or wound cleanser. Apply gentamycin ointment to surrounding area and then apply a gauze moistened (not soaked) with Dakin's solution. Cover with a dry gauze then secure with an Allevyn Sacral Foam Dressing. Bathing/ Shower/ Hygiene: May shower; gently cleanse wound with antibacterial soap, rinse and pat dry prior to dressing wounds Off-Loading: Low air-loss mattress (Group 2) Turn and reposition every 2 hours WOUND #1: - Coccyx Wound Laterality: Midline Cleanser: Dakin 16 (oz) 0.25 (Home Health) 1 x Per Day/30 Days Discharge Instructions: Use as directed. Cleanser: Soap and Water (Home Health) 1 x Per Day/30 Days Discharge Instructions: Gently cleanse wound with antibacterial soap, rinse and pat dry prior to dressing wounds Topical: Gentamicin (Generic) 1 x Per Day/30 Days Gilkeson, Teriana B ([id number]) [id number] Page 6 of 7 Discharge Instructions: Apply as directed by provider. Prim Dressing: Gauze (Home Health) 1 x Per Day/30 Days ary Discharge Instructions: As directed: dry, moistened with saline or moistened with Dakins Solution Secondary Dressing: Allevyn Adhesive Foam Sacrum Dressing, 6.75x6.75 (in/in) (Home Health) 1 x Per Day/30 Days 1. Unfortunately based on what I am seeing right now I do not think that we are quite at the point of being able to initiate a wound VAC. That would be the ideal situation but to be perfectly honest that is not possible at the moment. It may be something that would be possible in the near future  which would be excellent but we need to get some growth at the region between the perianal and sacral wound before would not be able to get this to secure and stay intact. 2. I am good recommend as well for the time being she is doing so well we will continue with the gentamicin topically followed by the Dakin's moistened gauze dressing which I think is doing a really good job they are changing this once a day and that seems to be excellent. 3. I am also can recommend continuing  appropriate offloading I think this is of utmost concern and obviously they seem to be doing a great job with this the good news is the patient can get up and move around to a degree she is not completely bedbound which is excellent and bodes well for healing. We will see patient back for reevaluation in 1 week here in the clinic. If anything worsens or changes patient will contact our office for additional recommendations. Electronic Signature(s) Signed: 10/23/2022 2:01:25 PM By: Allen Derry PA-C Entered By: Allen Derry on 10/23/2022 14:01:24 -------------------------------------------------------------------------------- ROS/PFSH Details Patient Name: Date of Service: Giovanetti, A NN B. 10/21/2022 2:00 PM Medical Record Number: [id number] Patient Account Number: [id number] Date of Birth/Sex: Treating RN: Jul 10, 1940 (82 y.o. [name] Primary Care Provider: Jacolyn Reedy Other Clinician: Referring Provider: Treating Provider/Extender: Allen Derry RA Rocky Crafts, [name] Weeks in Treatment: 0 Information Obtained From Patient Caregiver Constitutional Symptoms (General Health) Complaints and Symptoms: Negative for: Fatigue; Fever; Chills; Marked Weight Change Eyes Complaints and Symptoms: Positive for: Glasses / Contacts Psychiatric Complaints and Symptoms: Negative for: Anxiety; Claustrophobia Cardiovascular Medical History: Positive for: Hypertension Oncologic Medical History: Positive for: Received  Chemotherapy Past Medical History Notes: Multiple myeloma Immunizations Pneumococcal VaccineRosine Langi, Audrena B ([id number]) [id number] Page 7 of 7 Received Pneumococcal Vaccination: Yes Received Pneumococcal Vaccination On or After 60th Birthday: Yes Implantable Devices No devices added Family and Social History Never smoker; Marital Status - Widowed; Alcohol Use: Never; Drug Use: No History; Caffeine Use: Never Electronic Signature(s) Signed: 10/21/2022 4:39:16 PM By: Midge Aver MSN RN CNS WTA Signed: 10/22/2022 5:59:11 PM By: Allen Derry PA-C Entered By: Midge Aver on 10/21/2022 14:23:02 -------------------------------------------------------------------------------- SuperBill Details Patient Name: Date of Service: Rich Number NN B. 10/21/2022 Medical Record Number: [id number] Patient Account Number: [id number] Date of Birth/Sex: Treating RN: [birth year] (82 y.o. [name] Primary Care Provider: Jacolyn Reedy Other Clinician: Referring Provider: Treating Provider/Extender: Allen Derry RA Rocky Crafts, [name] Weeks in Treatment: 0 Diagnosis Coding ICD-10 Codes Code Description L89.154 Pressure ulcer of sacral region, stage 4 M62.81 Muscle weakness (generalized) C90.00 Multiple myeloma not having achieved remission Facility Procedures : 7 CPT4 Code: 0865784 Description: 99214 - WOUND CARE VISIT-LEV 4 EST PT Modifier: Quantity: 1 Physician Procedures : CPT4 Code Description Modifier [id number] WC PHYS LEVEL 3 NEW PT ICD-10 Diagnosis Description L89.154 Pressure ulcer of sacral region, stage 4 M62.81 Muscle weakness (generalized) C90.00 Multiple myeloma not having achieved remission Quantity: 1 Electronic Signature(s) Signed: 10/21/2022 6:02:48 PM By: Allen Derry PA-C Previous Signature: 10/21/2022 4:39:16 PM Version By: Midge Aver MSN RN CNS WTA Entered By: Allen Derry on 10/21/2022 18:02:47

## 2022-10-24 ENCOUNTER — Inpatient Hospital Stay: Payer: Medicare PPO

## 2022-10-24 ENCOUNTER — Inpatient Hospital Stay (HOSPITAL_BASED_OUTPATIENT_CLINIC_OR_DEPARTMENT_OTHER): Payer: Medicare PPO | Admitting: Internal Medicine

## 2022-10-24 ENCOUNTER — Ambulatory Visit: Payer: Medicare PPO

## 2022-10-24 ENCOUNTER — Other Ambulatory Visit: Payer: Self-pay

## 2022-10-24 ENCOUNTER — Other Ambulatory Visit: Payer: Medicare PPO

## 2022-10-24 DIAGNOSIS — I272 Pulmonary hypertension, unspecified: Secondary | ICD-10-CM | POA: Diagnosis not present

## 2022-10-24 DIAGNOSIS — C9000 Multiple myeloma not having achieved remission: Secondary | ICD-10-CM | POA: Diagnosis not present

## 2022-10-24 DIAGNOSIS — Z7952 Long term (current) use of systemic steroids: Secondary | ICD-10-CM | POA: Diagnosis not present

## 2022-10-24 DIAGNOSIS — M8580 Other specified disorders of bone density and structure, unspecified site: Secondary | ICD-10-CM | POA: Diagnosis not present

## 2022-10-24 DIAGNOSIS — D630 Anemia in neoplastic disease: Secondary | ICD-10-CM | POA: Diagnosis not present

## 2022-10-24 DIAGNOSIS — I824Z2 Acute embolism and thrombosis of unspecified deep veins of left distal lower extremity: Secondary | ICD-10-CM | POA: Diagnosis not present

## 2022-10-24 DIAGNOSIS — Z5112 Encounter for antineoplastic immunotherapy: Secondary | ICD-10-CM | POA: Diagnosis not present

## 2022-10-24 DIAGNOSIS — Z79624 Long term (current) use of inhibitors of nucleotide synthesis: Secondary | ICD-10-CM | POA: Diagnosis not present

## 2022-10-24 DIAGNOSIS — Z79899 Other long term (current) drug therapy: Secondary | ICD-10-CM | POA: Diagnosis not present

## 2022-10-24 DIAGNOSIS — Z7961 Long term (current) use of immunomodulator: Secondary | ICD-10-CM | POA: Diagnosis not present

## 2022-10-24 DIAGNOSIS — L89154 Pressure ulcer of sacral region, stage 4: Secondary | ICD-10-CM | POA: Diagnosis not present

## 2022-10-24 DIAGNOSIS — E43 Unspecified severe protein-calorie malnutrition: Secondary | ICD-10-CM | POA: Diagnosis not present

## 2022-10-24 DIAGNOSIS — I129 Hypertensive chronic kidney disease with stage 1 through stage 4 chronic kidney disease, or unspecified chronic kidney disease: Secondary | ICD-10-CM | POA: Diagnosis not present

## 2022-10-24 DIAGNOSIS — G893 Neoplasm related pain (acute) (chronic): Secondary | ICD-10-CM | POA: Diagnosis not present

## 2022-10-24 DIAGNOSIS — C7951 Secondary malignant neoplasm of bone: Secondary | ICD-10-CM | POA: Diagnosis not present

## 2022-10-24 LAB — CBC WITH DIFFERENTIAL (CANCER CENTER ONLY)
Abs Immature Granulocytes: 0.08 10*3/uL — ABNORMAL HIGH (ref 0.00–0.07)
Basophils Absolute: 0.1 10*3/uL (ref 0.0–0.1)
Basophils Relative: 1 %
Eosinophils Absolute: 1.3 10*3/uL — ABNORMAL HIGH (ref 0.0–0.5)
Eosinophils Relative: 13 %
HCT: 28.7 % — ABNORMAL LOW (ref 36.0–46.0)
Hemoglobin: 9.3 g/dL — ABNORMAL LOW (ref 12.0–15.0)
Immature Granulocytes: 1 %
Lymphocytes Relative: 24 %
Lymphs Abs: 2.4 10*3/uL (ref 0.7–4.0)
MCH: 31.6 pg (ref 26.0–34.0)
MCHC: 32.4 g/dL (ref 30.0–36.0)
MCV: 97.6 fL (ref 80.0–100.0)
Monocytes Absolute: 0.9 10*3/uL (ref 0.1–1.0)
Monocytes Relative: 9 %
Neutro Abs: 5.2 10*3/uL (ref 1.7–7.7)
Neutrophils Relative %: 52 %
Platelet Count: 181 10*3/uL (ref 150–400)
RBC: 2.94 MIL/uL — ABNORMAL LOW (ref 3.87–5.11)
RDW: 21.9 % — ABNORMAL HIGH (ref 11.5–15.5)
WBC Count: 9.9 10*3/uL (ref 4.0–10.5)
nRBC: 0 % (ref 0.0–0.2)

## 2022-10-24 LAB — LACTATE DEHYDROGENASE: LDH: 263 U/L — ABNORMAL HIGH (ref 98–192)

## 2022-10-24 LAB — CMP (CANCER CENTER ONLY)
ALT: 57 U/L — ABNORMAL HIGH (ref 0–44)
AST: 26 U/L (ref 15–41)
Albumin: 3 g/dL — ABNORMAL LOW (ref 3.5–5.0)
Alkaline Phosphatase: 297 U/L — ABNORMAL HIGH (ref 38–126)
Anion gap: 7 (ref 5–15)
BUN: 22 mg/dL (ref 8–23)
CO2: 29 mmol/L (ref 22–32)
Calcium: 8.3 mg/dL — ABNORMAL LOW (ref 8.9–10.3)
Chloride: 106 mmol/L (ref 98–111)
Creatinine: 0.6 mg/dL (ref 0.44–1.00)
GFR, Estimated: >60 mL/min (ref >60)
Glucose, Bld: 128 mg/dL — ABNORMAL HIGH (ref 70–99)
Potassium: 3.3 mmol/L — ABNORMAL LOW (ref 3.5–5.1)
Sodium: 142 mmol/L (ref 135–145)
Total Bilirubin: 0.4 mg/dL (ref 0.3–1.2)
Total Protein: 4.9 g/dL — ABNORMAL LOW (ref 6.5–8.1)

## 2022-10-24 LAB — SAMPLE TO BLOOD BANK

## 2022-10-24 MED ORDER — DEXAMETHASONE 4 MG PO TABS
20.0000 mg | ORAL_TABLET | Freq: Once | ORAL | Status: AC
  Administered 2022-10-24: 20 mg via ORAL
  Filled 2022-10-24: qty 5

## 2022-10-24 MED ORDER — ACETAMINOPHEN 325 MG PO TABS
650.0000 mg | ORAL_TABLET | Freq: Once | ORAL | Status: AC
  Administered 2022-10-24: 650 mg via ORAL
  Filled 2022-10-24: qty 2

## 2022-10-24 MED ORDER — DIPHENHYDRAMINE HCL 25 MG PO CAPS
50.0000 mg | ORAL_CAPSULE | Freq: Once | ORAL | Status: AC
  Administered 2022-10-24: 50 mg via ORAL
  Filled 2022-10-24: qty 2

## 2022-10-24 MED ORDER — DARATUMUMAB-HYALURONIDASE-FIHJ 1800-30000 MG-UT/15ML ~~LOC~~ SOLN
1800.0000 mg | Freq: Once | SUBCUTANEOUS | Status: AC
  Administered 2022-10-24: 1800 mg via SUBCUTANEOUS
  Filled 2022-10-24: qty 15

## 2022-10-24 NOTE — Patient Instructions (Signed)

## 2022-10-24 NOTE — Progress Notes (Signed)
Patient seen by Dr. Mohamed  Vitals are within treatment parameters.  Labs reviewed: and are within treatment parameters.  Per physician team, patient is ready for treatment and there are NO modifications to the treatment plan.  

## 2022-10-24 NOTE — Progress Notes (Signed)
[facility] [phone]   [phone]) [phone]  OFFICE PROGRESS NOTE  [name], MD 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 Dorris Fetch on January 28, 2022.    CURRENT THERAPY:  1) Starting systemic therapy with Revlimid 21 days on 7 days off, Darzalex, and 20 mg of Decadron weekly.  First dose expected next week on 09/11/2022.  Status post 1 cycle.  She is here today for day 15 of cycle #2. 2) Monthly Zometa injections for 6 to 12 months, followed by every 3 months thereon after once dental clearance is obtained.  INTERVAL HISTORY: [name] 82 y.o. female returns to the clinic today for follow-up visit accompanied by her son.  The patient is feeling fine today with no concerning complaints except for the baseline fatigue and nonproductive cough recently.  She denied having any current chest pain, shortness of breath or hemoptysis.  She has no nausea, vomiting, diarrhea or constipation.  She has no headache or visual changes.  She has intermittent confusion that comes and goes especially at nighttime.  She continues to tolerate her treatment fairly well.  She is here for evaluation before starting day 15 of cycle #2.  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 by echo 10/2021   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/2015   Carotid artery stenosis    < 50%  stenosis bilaterally by doppler 07/2016   Coronary artery disease 2008   Coronary Ca score of 331 with minimal multivessel plaque < 25% stenosis by coronary CTA 8/23   Heart murmur    per pt   Hypercholesteremia    Hypertension    Obesity    Osteopenia    declines treatment   Pneumonia 1995   Pulmonary HTN (HCC)    mild to moderate by echo 7/23 with PASP   Shoulder pain    Due to arthritis   Vitamin D insufficiency     ALLERGIES:  is allergic to crestor [rosuvastatin calcium], lactose, lipitor [atorvastatin], pravastatin, simvastatin, tramadol, zetia [ezetimibe], codeine, penicillins, sulfa antibiotics, and vancomycin.  MEDICATIONS:  Current Outpatient Medications  Medication Sig Dispense Refill   acetaminophen (TYLENOL) 325 MG tablet Take 2 tablets (650 mg total) by mouth every 8 (eight) hours as needed.     acyclovir (ZOVIRAX) 200 MG capsule Take 200 mg by mouth 2 (two) times daily.     allopurinol (ZYLOPRIM) 100 MG tablet Take 1 tablet (100 mg total) by mouth 2 (two) times daily. 60 tablet 2   ascorbic acid (VITAMIN C) 500 MG tablet Take 1 tablet (500 mg total) by mouth daily.     daratumumab-hyaluronidase-fihj (DARZALEX FASPRO) 1800-30000 MG-UT/15ML SOLN Inject 1,800 mg into the skin once.     dexamethasone (DECADRON) 2 MG tablet Take 1 tablet (2 mg total) by mouth  daily.     dexamethasone (DECADRON) 4 MG tablet Take 20 mg by mouth once a week. For multiple myeloma as part of her treatment     gentamicin ointment (GARAMYCIN) 0.1 % Apply 1 Application topically daily.     H-CHLOR 12 0.125 % SOLN      lenalidomide (REVLIMID) 15 MG capsule TAKE 1 CAPSULE BY MOUTH EVERY DAY FOR 21 DAYS THEN HOLD FOR 7 DAYS. REPEAT EVERY 28 DAYS 21 capsule 0   magic mouthwash SOLN Take 5 mLs by mouth 4 (four) times daily. Swish and spit 240 mL 0   Multiple Vitamin (MULTIVITAMIN WITH MINERALS) TABS tablet Take 1 tablet by mouth daily.     naloxone (NARCAN) nasal spray 4 mg/0.1 mL       pantoprazole (PROTONIX) 40 MG tablet Take 1 tablet (40 mg total) by mouth 2 (two) times daily. 30 tablet 0   sodium hypochlorite (DAKIN'S 1/4 STRENGTH) 0.125 % SOLN 1 application Externally Once a day for 30 days     multivitamin (RENA-VIT) TABS tablet Take 1 tablet by mouth at bedtime. (Patient not taking: Reported on 10/09/2022)  0   No current facility-administered medications for this visit.    SURGICAL HISTORY:  Past Surgical History:  Procedure Laterality Date   AORTIC VALVE REPLACEMENT N/A 10/14/2013   Procedure: AORTIC VALVE REPLACEMENT (AVR);  Surgeon: Alleen Borne, MD;  Location: [facility] OR;  Service: Open Heart Surgery;  Laterality: N/A;   CARDIAC CATHETERIZATION     CATARACT EXTRACTION, BILATERAL     CHOLECYSTECTOMY  04/08/1988   ESOPHAGOGASTRODUODENOSCOPY (EGD) WITH PROPOFOL N/A 09/05/2022   Procedure: ESOPHAGOGASTRODUODENOSCOPY (EGD) WITH PROPOFOL;  Surgeon: Kathi Der, MD;  Location: [facility] ENDOSCOPY;  Service: Gastroenterology;  Laterality: N/A;   INTRAOPERATIVE TRANSESOPHAGEAL ECHOCARDIOGRAM N/A 10/14/2013   Procedure: INTRAOPERATIVE TRANSESOPHAGEAL ECHOCARDIOGRAM;  Surgeon: Alleen Borne, MD;  Location: [facility] OR;  Service: Open Heart Surgery;  Laterality: N/A;   IVC FILTER INSERTION N/A 09/06/2022   Procedure: IVC FILTER INSERTION;  Surgeon: Leonie Douglas, MD;  Location: [facility] INVASIVE CV LAB;  Service: Cardiovascular;  Laterality: N/A;   KYPHOPLASTY Bilateral 03/27/2022   Procedure: KYPHOPLASTY AND BIOPSY THORACIC ELEVEN;  Surgeon: Lisbeth Renshaw, MD;  Location: [facility] OR;  Service: Neurosurgery;  Laterality: Bilateral;   LEFT AND RIGHT HEART CATHETERIZATION WITH CORONARY ANGIOGRAM N/A 09/16/2013   Procedure: LEFT AND RIGHT HEART CATHETERIZATION WITH CORONARY ANGIOGRAM;  Surgeon: Quintella Reichert, MD;  Location: [facility] CATH LAB;  Service: Cardiovascular;  Laterality: N/A;   TONSILLECTOMY      REVIEW OF SYSTEMS:  A comprehensive review of systems was negative except for:  Constitutional: positive for fatigue Respiratory: positive for cough   PHYSICAL EXAMINATION: General appearance: alert, cooperative, fatigued, and no distress Head: Normocephalic, without obvious abnormality, atraumatic Neck: no adenopathy, no JVD, supple, symmetrical, trachea midline, and thyroid not enlarged, symmetric, no tenderness/mass/nodules Lymph nodes: Cervical, supraclavicular, and axillary nodes normal. Resp: clear to auscultation bilaterally Back: symmetric, no curvature. ROM normal. No CVA tenderness. Cardio: regular rate and rhythm, S1, S2 normal, no murmur, click, rub or gallop GI: soft, non-tender; bowel sounds normal; no masses,  no organomegaly Extremities: extremities normal, atraumatic, no cyanosis or edema  ECOG PERFORMANCE STATUS: 2 - Symptomatic, <50% confined to bed  Blood pressure (!) 135/44, pulse 62, temperature 98.9 F (37.2 C), temperature source Oral, resp. rate 16, weight 143 lb 12.8 oz (65.2 kg), SpO2 96%.  LABORATORY DATA: Lab Results  Component Value Date   WBC 9.9 10/24/2022  HGB 9.3 (L) 10/24/2022   HCT 28.7 (L) 10/24/2022   MCV 97.6 10/24/2022   PLT 181 10/24/2022      Chemistry      Component Value Date/Time   NA 140 10/16/2022 1327   NA 141 10/06/2020 0939   K 3.9 10/16/2022 1327   CL 106 10/16/2022 1327   CO2 27 10/16/2022 1327   BUN 26 (H) 10/16/2022 1327   BUN 17 10/06/2020 0939   CREATININE 0.72 10/16/2022 1327   CREATININE 0.85 01/29/2016 0910      Component Value Date/Time   CALCIUM 8.2 (L) 10/16/2022 1327   ALKPHOS 397 (H) 10/16/2022 1327   AST 22 10/16/2022 1327   ALT 61 (H) 10/16/2022 1327   BILITOT 0.3 10/16/2022 1327       RADIOGRAPHIC STUDIES: No results found.  ASSESSMENT AND PLAN: This is a very pleasant 82 years old white female with  1) history of stage I (T1a, N0, M0) thymoma type AB diagnosed in October 2023 status post resection under the care of Dr. Dorris Fetch on January 28, 2022 with close resection  margin and microscopic infiltration of the capsule. 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  The patient is undergoing systemic therapy with daratumumab, Revlimid 15 mg p.o. daily for 21 days every 4 weeks in addition to Decadron 20 mg weekly with the treatment.  Status post 1 cycle.   She is currently undergoing cycle #2. She is tolerating the treatment fairly well. I recommended for her to proceed with day 15 of cycle #2 as planned. She will come back for follow-up visit in 2 weeks for evaluation before starting day 1 of cycle #3. For the anemia of neoplastic disease, we will continue to monitor her hemoglobin and hematocrit closely and consider her for blood transfusion if her hemoglobin is less than 8. For the malnutrition, she is much better and she was evaluated by the dietitian at the cancer center recently. Will consider repeating her myeloma panel in 1 months. The patient was advised to call immediately if she has any other concerning symptoms in the interval. The patient voices understanding of current disease status and treatment options and is in agreement with the current care plan.  All questions were answered. The patient knows to call the clinic with any problems, questions or concerns. We can certainly see the patient much sooner if necessary.  The total time spent in the appointment was 20 minutes.  Disclaimer: This note was dictated with voice recognition software. Similar sounding words can inadvertently be transcribed and may not be corrected upon review.

## 2022-10-26 DIAGNOSIS — L89154 Pressure ulcer of sacral region, stage 4: Secondary | ICD-10-CM | POA: Diagnosis not present

## 2022-10-26 DIAGNOSIS — D630 Anemia in neoplastic disease: Secondary | ICD-10-CM | POA: Diagnosis not present

## 2022-10-26 DIAGNOSIS — E43 Unspecified severe protein-calorie malnutrition: Secondary | ICD-10-CM | POA: Diagnosis not present

## 2022-10-26 DIAGNOSIS — I824Z2 Acute embolism and thrombosis of unspecified deep veins of left distal lower extremity: Secondary | ICD-10-CM | POA: Diagnosis not present

## 2022-10-26 DIAGNOSIS — C7951 Secondary malignant neoplasm of bone: Secondary | ICD-10-CM | POA: Diagnosis not present

## 2022-10-26 DIAGNOSIS — I272 Pulmonary hypertension, unspecified: Secondary | ICD-10-CM | POA: Diagnosis not present

## 2022-10-26 DIAGNOSIS — I129 Hypertensive chronic kidney disease with stage 1 through stage 4 chronic kidney disease, or unspecified chronic kidney disease: Secondary | ICD-10-CM | POA: Diagnosis not present

## 2022-10-26 DIAGNOSIS — G893 Neoplasm related pain (acute) (chronic): Secondary | ICD-10-CM | POA: Diagnosis not present

## 2022-10-26 DIAGNOSIS — C9000 Multiple myeloma not having achieved remission: Secondary | ICD-10-CM | POA: Diagnosis not present

## 2022-10-27 DIAGNOSIS — L89159 Pressure ulcer of sacral region, unspecified stage: Secondary | ICD-10-CM | POA: Diagnosis not present

## 2022-10-27 DIAGNOSIS — M8448XD Pathological fracture, other site, subsequent encounter for fracture with routine healing: Secondary | ICD-10-CM | POA: Diagnosis not present

## 2022-10-27 DIAGNOSIS — G894 Chronic pain syndrome: Secondary | ICD-10-CM | POA: Diagnosis not present

## 2022-10-28 ENCOUNTER — Encounter: Payer: Medicare PPO | Admitting: Internal Medicine

## 2022-10-28 DIAGNOSIS — D630 Anemia in neoplastic disease: Secondary | ICD-10-CM | POA: Diagnosis not present

## 2022-10-28 DIAGNOSIS — I129 Hypertensive chronic kidney disease with stage 1 through stage 4 chronic kidney disease, or unspecified chronic kidney disease: Secondary | ICD-10-CM | POA: Diagnosis not present

## 2022-10-28 DIAGNOSIS — C9000 Multiple myeloma not having achieved remission: Secondary | ICD-10-CM | POA: Diagnosis not present

## 2022-10-28 DIAGNOSIS — I272 Pulmonary hypertension, unspecified: Secondary | ICD-10-CM | POA: Diagnosis not present

## 2022-10-28 DIAGNOSIS — E43 Unspecified severe protein-calorie malnutrition: Secondary | ICD-10-CM | POA: Diagnosis not present

## 2022-10-28 DIAGNOSIS — I10 Essential (primary) hypertension: Secondary | ICD-10-CM | POA: Diagnosis not present

## 2022-10-28 DIAGNOSIS — C7951 Secondary malignant neoplasm of bone: Secondary | ICD-10-CM | POA: Diagnosis not present

## 2022-10-28 DIAGNOSIS — G893 Neoplasm related pain (acute) (chronic): Secondary | ICD-10-CM | POA: Diagnosis not present

## 2022-10-28 DIAGNOSIS — I824Z2 Acute embolism and thrombosis of unspecified deep veins of left distal lower extremity: Secondary | ICD-10-CM | POA: Diagnosis not present

## 2022-10-28 DIAGNOSIS — B372 Candidiasis of skin and nail: Secondary | ICD-10-CM | POA: Diagnosis not present

## 2022-10-28 DIAGNOSIS — L89154 Pressure ulcer of sacral region, stage 4: Secondary | ICD-10-CM | POA: Diagnosis not present

## 2022-10-28 LAB — TYPE AND SCREEN
ABO/RH(D): O POS
Antibody Screen: POSITIVE
DAT, IgG: NEGATIVE
Unit division: 0

## 2022-10-28 LAB — BPAM RBC
Blood Product Expiration Date: 202408062359
ISSUE DATE / TIME: 202407051409
Unit Type and Rh: 5100

## 2022-10-29 DIAGNOSIS — I129 Hypertensive chronic kidney disease with stage 1 through stage 4 chronic kidney disease, or unspecified chronic kidney disease: Secondary | ICD-10-CM | POA: Diagnosis not present

## 2022-10-29 DIAGNOSIS — L89154 Pressure ulcer of sacral region, stage 4: Secondary | ICD-10-CM | POA: Diagnosis not present

## 2022-10-29 DIAGNOSIS — C9000 Multiple myeloma not having achieved remission: Secondary | ICD-10-CM | POA: Diagnosis not present

## 2022-10-29 DIAGNOSIS — D630 Anemia in neoplastic disease: Secondary | ICD-10-CM | POA: Diagnosis not present

## 2022-10-29 DIAGNOSIS — G893 Neoplasm related pain (acute) (chronic): Secondary | ICD-10-CM | POA: Diagnosis not present

## 2022-10-29 DIAGNOSIS — C7951 Secondary malignant neoplasm of bone: Secondary | ICD-10-CM | POA: Diagnosis not present

## 2022-10-29 DIAGNOSIS — E43 Unspecified severe protein-calorie malnutrition: Secondary | ICD-10-CM | POA: Diagnosis not present

## 2022-10-29 DIAGNOSIS — I272 Pulmonary hypertension, unspecified: Secondary | ICD-10-CM | POA: Diagnosis not present

## 2022-10-29 DIAGNOSIS — I824Z2 Acute embolism and thrombosis of unspecified deep veins of left distal lower extremity: Secondary | ICD-10-CM | POA: Diagnosis not present

## 2022-10-29 NOTE — Progress Notes (Deleted)
Palliative Medicine [facility]  Telephone:([phone] Fax:(336) [phone]   Name: [name] Date: 10/29/2022 MRN: [id number]  DOB: Aug 25, 1940  Patient Care Team: [name], MD as PCP - General (Internal Medicine) Quintella Reichert, MD as PCP - Cardiology (Cardiology) Quintella Reichert, MD as Consulting Physician (Cardiology)    INTERVAL HISTORY: [name] is a 82 y.o. 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 Palliative ask to see for symptom management and goals of care.   SOCIAL HISTORY:     reports that she has never smoked. She has never used smokeless tobacco. She reports that she does not drink alcohol and does not use drugs.  ADVANCE DIRECTIVES:  Advanced directives on file  CODE STATUS: DNR  PAST 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 by echo 10/2021   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/2015   Carotid artery stenosis    < 50% stenosis bilaterally by doppler 07/2016   Coronary artery disease 2008   Coronary Ca score of 331 with minimal multivessel plaque < 25% stenosis by coronary CTA 8/23   Heart murmur    per pt   Hypercholesteremia    Hypertension    Obesity    Osteopenia    declines treatment   Pneumonia 1995   Pulmonary HTN (HCC)    mild to moderate by echo 7/23 with PASP   Shoulder pain    Due to arthritis   Vitamin D insufficiency     ALLERGIES:  is allergic to crestor [rosuvastatin calcium], lactose, lipitor [atorvastatin], pravastatin, simvastatin, tramadol, zetia [ezetimibe], codeine, penicillins, sulfa antibiotics, and  vancomycin.  MEDICATIONS:  Current Outpatient Medications  Medication Sig Dispense Refill   acetaminophen (TYLENOL) 325 MG tablet Take 2 tablets (650 mg total) by mouth every 8 (eight) hours as needed.     acyclovir (ZOVIRAX) 200 MG capsule Take 200 mg by mouth 2 (two) times daily.     allopurinol (ZYLOPRIM) 100 MG tablet Take 1 tablet (100 mg total) by mouth 2 (two) times daily. 60 tablet 2   ascorbic acid (VITAMIN C) 500 MG tablet Take 1 tablet (500 mg total) by mouth daily.     daratumumab-hyaluronidase-fihj (DARZALEX FASPRO) 1800-30000 MG-UT/15ML SOLN Inject 1,800 mg into the skin once.     dexamethasone (DECADRON) 2 MG tablet Take 1 tablet (2 mg total) by mouth daily.     dexamethasone (DECADRON) 4 MG tablet Take 20 mg by mouth once a week. For multiple myeloma as part of her treatment     gentamicin ointment (GARAMYCIN) 0.1 % Apply 1 Application topically daily.     H-CHLOR 12 0.125 % SOLN      lenalidomide (REVLIMID) 15 MG capsule TAKE 1 CAPSULE BY MOUTH EVERY DAY FOR 21 DAYS THEN HOLD FOR 7 DAYS. REPEAT EVERY 28 DAYS 21 capsule 0   magic mouthwash SOLN Take 5 mLs by mouth 4 (four) times daily. Swish and spit 240 mL 0   Multiple Vitamin (MULTIVITAMIN WITH MINERALS) TABS tablet Take 1 tablet by mouth daily.  multivitamin (RENA-VIT) TABS tablet Take 1 tablet by mouth at bedtime. (Patient not taking: Reported on 10/09/2022)  0   naloxone (NARCAN) nasal spray 4 mg/0.1 mL      pantoprazole (PROTONIX) 40 MG tablet Take 1 tablet (40 mg total) by mouth 2 (two) times daily. 30 tablet 0   sodium hypochlorite (DAKIN'S 1/4 STRENGTH) 0.125 % SOLN 1 application Externally Once a day for 30 days     No current facility-administered medications for this visit.    VITAL SIGNS: There were no vitals taken for this visit. There were no vitals filed for this visit.  Estimated body mass index is 27.17 kg/m as calculated from the following:   Height as of 08/31/22: 5\' 1"  (1.549 m).   Weight as of  10/24/22: 143 lb 12.8 oz (65.2 kg).   PERFORMANCE STATUS (ECOG) : 3 - Symptomatic, >50% confined to bed   Physical Exam General: NAD Cardiovascular: regular rate and rhythm Pulmonary: normal breathing pattern  Extremities: no edema, no joint deformities Skin: no rashes Neurological: AAO x4  IMPRESSION:  Neoplasm related pain/decubitus ulcer [name] states her pain is much improved. She is not requiring pain medication. Occasional Tylenol use. Is not requiring oxycodone since returning home. Son reports most of her pain is in her sacral area when changing dressing or changing position. Once she is done or stabilized pain is resolved.    No adjustments to current regimen at this time.  We will continue to closely monitor.  Son knows to contact our office as needed.   Insomnia [name] feels she is sleeping much better since returning home in her own environment. Not requiring any supplements such as melatonin.   I discussed the importance of continued conversation with family and their medical providers regarding overall plan of care and treatment options, ensuring decisions are within the context of the patients values and GOCs.  PLAN: Tylenol 650 mg every 8 hours as needed Ongoing symptom management and goals of care discussions as needed I will plan to see patient back in 2-4 weeks in collaboration to other oncology appointments.  Family knows to contact office sooner if needed.   Patient expressed understanding and was in agreement with this plan. She also understands that She can call the clinic at any time with any questions, concerns, or complaints.   Any controlled substances utilized were prescribed in the context of palliative care. PDMP has been reviewed.    Visit consisted of counseling and education dealing with the complex and emotionally intense issues of symptom management and palliative care in the setting of serious and potentially life-threatening  illness.Greater than 50%  of this time was spent counseling and coordinating care related to the above assessment and plan.  Willette Alma, AGPCNP-BC  Palliative Medicine Team/[facility]  *Please note that this is a verbal dictation therefore any spelling or grammatical errors are due to the "[employer]agon [employer] One" system interpretation.

## 2022-10-30 ENCOUNTER — Inpatient Hospital Stay: Payer: Medicare PPO

## 2022-10-30 ENCOUNTER — Inpatient Hospital Stay: Payer: Medicare PPO | Admitting: Dietician

## 2022-10-30 ENCOUNTER — Ambulatory Visit: Payer: Medicare PPO

## 2022-10-30 ENCOUNTER — Inpatient Hospital Stay: Payer: Medicare PPO | Admitting: Nurse Practitioner

## 2022-10-30 VITALS — BP 114/37 | HR 61 | Temp 98.6°F | Resp 16

## 2022-10-30 DIAGNOSIS — Z79899 Other long term (current) drug therapy: Secondary | ICD-10-CM | POA: Diagnosis not present

## 2022-10-30 DIAGNOSIS — Z7961 Long term (current) use of immunomodulator: Secondary | ICD-10-CM | POA: Diagnosis not present

## 2022-10-30 DIAGNOSIS — Z79624 Long term (current) use of inhibitors of nucleotide synthesis: Secondary | ICD-10-CM | POA: Diagnosis not present

## 2022-10-30 DIAGNOSIS — C9000 Multiple myeloma not having achieved remission: Secondary | ICD-10-CM

## 2022-10-30 DIAGNOSIS — D630 Anemia in neoplastic disease: Secondary | ICD-10-CM | POA: Diagnosis not present

## 2022-10-30 DIAGNOSIS — Z7952 Long term (current) use of systemic steroids: Secondary | ICD-10-CM | POA: Diagnosis not present

## 2022-10-30 DIAGNOSIS — M8580 Other specified disorders of bone density and structure, unspecified site: Secondary | ICD-10-CM | POA: Diagnosis not present

## 2022-10-30 DIAGNOSIS — Z5112 Encounter for antineoplastic immunotherapy: Secondary | ICD-10-CM | POA: Diagnosis not present

## 2022-10-30 LAB — CMP (CANCER CENTER ONLY)
ALT: 43 U/L (ref 0–44)
AST: 16 U/L (ref 15–41)
Albumin: 3 g/dL — ABNORMAL LOW (ref 3.5–5.0)
Alkaline Phosphatase: 253 U/L — ABNORMAL HIGH (ref 38–126)
Anion gap: 6 (ref 5–15)
BUN: 29 mg/dL — ABNORMAL HIGH (ref 8–23)
CO2: 27 mmol/L (ref 22–32)
Calcium: 8.2 mg/dL — ABNORMAL LOW (ref 8.9–10.3)
Chloride: 107 mmol/L (ref 98–111)
Creatinine: 0.63 mg/dL (ref 0.44–1.00)
GFR, Estimated: >60 mL/min (ref >60)
Glucose, Bld: 175 mg/dL — ABNORMAL HIGH (ref 70–99)
Potassium: 4.2 mmol/L (ref 3.5–5.1)
Sodium: 140 mmol/L (ref 135–145)
Total Bilirubin: 0.3 mg/dL (ref 0.3–1.2)
Total Protein: 4.8 g/dL — ABNORMAL LOW (ref 6.5–8.1)

## 2022-10-30 LAB — CBC WITH DIFFERENTIAL (CANCER CENTER ONLY)
Abs Immature Granulocytes: 0.14 10*3/uL — ABNORMAL HIGH (ref 0.00–0.07)
Basophils Absolute: 0.1 10*3/uL (ref 0.0–0.1)
Basophils Relative: 1 %
Eosinophils Absolute: 0.6 10*3/uL — ABNORMAL HIGH (ref 0.0–0.5)
Eosinophils Relative: 9 %
HCT: 29.3 % — ABNORMAL LOW (ref 36.0–46.0)
Hemoglobin: 9.5 g/dL — ABNORMAL LOW (ref 12.0–15.0)
Immature Granulocytes: 2 %
Lymphocytes Relative: 15 %
Lymphs Abs: 1 10*3/uL (ref 0.7–4.0)
MCH: 31.9 pg (ref 26.0–34.0)
MCHC: 32.4 g/dL (ref 30.0–36.0)
MCV: 98.3 fL (ref 80.0–100.0)
Monocytes Absolute: 0.8 10*3/uL (ref 0.1–1.0)
Monocytes Relative: 11 %
Neutro Abs: 4.1 10*3/uL (ref 1.7–7.7)
Neutrophils Relative %: 62 %
Platelet Count: 168 10*3/uL (ref 150–400)
RBC: 2.98 MIL/uL — ABNORMAL LOW (ref 3.87–5.11)
RDW: 21.4 % — ABNORMAL HIGH (ref 11.5–15.5)
Smear Review: NORMAL
WBC Count: 6.7 10*3/uL (ref 4.0–10.5)
nRBC: 0 % (ref 0.0–0.2)

## 2022-10-30 LAB — SAMPLE TO BLOOD BANK

## 2022-10-30 LAB — LACTATE DEHYDROGENASE: LDH: 230 U/L — ABNORMAL HIGH (ref 98–192)

## 2022-10-30 MED ORDER — DARATUMUMAB-HYALURONIDASE-FIHJ 1800-30000 MG-UT/15ML ~~LOC~~ SOLN
1800.0000 mg | Freq: Once | SUBCUTANEOUS | Status: AC
  Administered 2022-10-30: 1800 mg via SUBCUTANEOUS
  Filled 2022-10-30: qty 15

## 2022-10-30 MED ORDER — ACETAMINOPHEN 325 MG PO TABS
650.0000 mg | ORAL_TABLET | Freq: Once | ORAL | Status: AC
  Administered 2022-10-30: 650 mg via ORAL
  Filled 2022-10-30: qty 2

## 2022-10-30 MED ORDER — DEXAMETHASONE 4 MG PO TABS
20.0000 mg | ORAL_TABLET | Freq: Once | ORAL | Status: AC
  Administered 2022-10-30: 20 mg via ORAL
  Filled 2022-10-30: qty 5

## 2022-10-30 MED ORDER — DIPHENHYDRAMINE HCL 25 MG PO CAPS
50.0000 mg | ORAL_CAPSULE | Freq: Once | ORAL | Status: AC
  Administered 2022-10-30: 50 mg via ORAL
  Filled 2022-10-30: qty 2

## 2022-10-30 NOTE — Patient Instructions (Signed)

## 2022-10-30 NOTE — Progress Notes (Signed)
Nutrition Assessment   Reason for Assessment: MST   ASSESSMENT: 82 year old female with multiple myeloma. She is receiving DaraRd q28d. Patient is under the care of Dr. Arbutus Ped.   Past medical history includes aortic stenosis, pulmonary HTN, pathological fracture of thoracic vertebra, CKD3, HLD, thymoma, severe PCM, AMS  Met with patient in infusion. She is sleeping at visit. Easily awakens with name call. Patient is a poor historian. Son is not present at time of RD arrival. Patient reports appetite is good. She likes to snack on peanut butter crackers. Patient reports her son is cooking meals for her. She is unable to provide recall. Patient denies nutrition impact symptoms.  Nutrition Focused Physical Exam: deferred    Medications: acyclovir, zyloprim, decadron, MMW, protonix,   Labs: glucose 175, BUN 29   Anthropometrics:   Height: 5'1" Weight: 143 lb 12.8 oz  UBW: 154 lb  BMI: 27.17    NUTRITION DIAGNOSIS: Unintended wt loss related to cancer as evidenced by 7% wt loss in 2 months - severe for time frame   INTERVENTION:  Encouraged small frequent meals/snacks with high calorie high protein foods Handouts with snack ideas + soft moist high protein foods provided Contact information provided    MONITORING, EVALUATION, GOAL: weight trends, intake   Next Visit: Wednesday August 28 during infusion

## 2022-10-30 NOTE — Progress Notes (Signed)
ANC is 4.1 today per Heather in the lab

## 2022-10-31 ENCOUNTER — Other Ambulatory Visit: Payer: Self-pay | Admitting: Medical Oncology

## 2022-10-31 ENCOUNTER — Telehealth: Payer: Self-pay | Admitting: Medical Oncology

## 2022-10-31 ENCOUNTER — Other Ambulatory Visit: Payer: Self-pay | Admitting: Internal Medicine

## 2022-10-31 DIAGNOSIS — G893 Neoplasm related pain (acute) (chronic): Secondary | ICD-10-CM | POA: Diagnosis not present

## 2022-10-31 DIAGNOSIS — C9000 Multiple myeloma not having achieved remission: Secondary | ICD-10-CM | POA: Diagnosis not present

## 2022-10-31 DIAGNOSIS — I129 Hypertensive chronic kidney disease with stage 1 through stage 4 chronic kidney disease, or unspecified chronic kidney disease: Secondary | ICD-10-CM | POA: Diagnosis not present

## 2022-10-31 DIAGNOSIS — I272 Pulmonary hypertension, unspecified: Secondary | ICD-10-CM | POA: Diagnosis not present

## 2022-10-31 DIAGNOSIS — C7951 Secondary malignant neoplasm of bone: Secondary | ICD-10-CM | POA: Diagnosis not present

## 2022-10-31 DIAGNOSIS — I824Z2 Acute embolism and thrombosis of unspecified deep veins of left distal lower extremity: Secondary | ICD-10-CM | POA: Diagnosis not present

## 2022-10-31 DIAGNOSIS — L89154 Pressure ulcer of sacral region, stage 4: Secondary | ICD-10-CM | POA: Diagnosis not present

## 2022-10-31 DIAGNOSIS — D630 Anemia in neoplastic disease: Secondary | ICD-10-CM | POA: Diagnosis not present

## 2022-10-31 DIAGNOSIS — E43 Unspecified severe protein-calorie malnutrition: Secondary | ICD-10-CM | POA: Diagnosis not present

## 2022-10-31 MED ORDER — LENALIDOMIDE 15 MG PO CAPS
15.0000 mg | ORAL_CAPSULE | Freq: Every day | ORAL | 0 refills | Status: DC
Start: 2022-10-31 — End: 2022-11-25

## 2022-10-31 NOTE — Telephone Encounter (Signed)
Revlimid refill sent to [facility] for authorization.

## 2022-11-01 ENCOUNTER — Other Ambulatory Visit: Payer: Medicare PPO

## 2022-11-01 ENCOUNTER — Encounter: Payer: Self-pay | Admitting: Internal Medicine

## 2022-11-01 ENCOUNTER — Ambulatory Visit: Payer: Medicare PPO

## 2022-11-01 ENCOUNTER — Encounter: Payer: Self-pay | Admitting: Physician Assistant

## 2022-11-01 DIAGNOSIS — I10 Essential (primary) hypertension: Secondary | ICD-10-CM | POA: Diagnosis not present

## 2022-11-01 DIAGNOSIS — G894 Chronic pain syndrome: Secondary | ICD-10-CM | POA: Diagnosis not present

## 2022-11-01 DIAGNOSIS — M8448XD Pathological fracture, other site, subsequent encounter for fracture with routine healing: Secondary | ICD-10-CM | POA: Diagnosis not present

## 2022-11-01 DIAGNOSIS — L89159 Pressure ulcer of sacral region, unspecified stage: Secondary | ICD-10-CM | POA: Diagnosis not present

## 2022-11-01 NOTE — Progress Notes (Signed)
Pricilla Holm, Gera B ([id number]) [id number] Page 1 of 9 Visit Report for 10/28/2022 Arrival Information Details Patient Name: Date of Service: DOHA, HARKER [place] B. 10/28/2022 8:30 A M Medical Record Number: [id number] Patient Account Number: [id number] Date of Birth/Sex: Treating RN: [birth year] (82 y.o. Female) Midge Aver Primary Care Maurio Baize: Hillard Danker Other Clinician: Referring Abbi Mancini: Treating Vignesh Willert/Extender: RO BSO N, MICHA EL [name], [name] Weeks in Treatment: 1 Visit Information History Since Last Visit Added or deleted any medications: No Patient Arrived: [name] Any new allergies or adverse reactions: No Arrival Time: 08:37 Has Dressing in Place as Prescribed: Yes Accompanied By: son Pain Present Now: No Transfer Assistance: None Patient Identification Verified: Yes Secondary Verification Process Completed: Yes Patient Requires Transmission-Based Precautions: No Patient Has Alerts: Yes Patient Alerts: Not Diabetic Electronic Signature(s) Signed: 10/31/2022 4:43:06 PM By: Midge Aver MSN RN CNS WTA Entered By: Midge Aver on 10/28/2022 08:56:18 -------------------------------------------------------------------------------- Clinic Level of Care Assessment Details Patient Name: Date of Service: SARETTA, SADEK NN B. 10/28/2022 8:30 A M Medical Record Number: [id number] Patient Account Number: [id number] Date of Birth/Sex: Treating RN: [birth year] (82 y.o. Female) Midge Aver Primary Care Dezerae Freiberger: Hillard Danker Other Clinician: Referring Jaeanna Mccomber: Treating Lynden Flemmer/Extender: RO BSO N, MICHA EL [name], [name] Weeks in Treatment: 1 Clinic Level of Care Assessment Items TOOL 4 Quantity Score X- 1 0 Use when only an EandM is performed on FOLLOW-UP visit ASSESSMENTS - Nursing Assessment / Reassessment X- 1 10 Reassessment of Co-morbidities (includes updates in patient status) X- 1 5 Reassessment of Adherence to Treatment Plan ASSESSMENTS - Wound and Skin A  ssessment / Reassessment X - Simple Wound Assessment / Reassessment - one wound 1 5 Dugdale, Meagan B ([id number]) [id number] Page 2 of 9 []  - 0 Complex Wound Assessment / Reassessment - multiple wounds []  - 0 Dermatologic / Skin Assessment (not related to wound area) ASSESSMENTS - Focused Assessment []  - 0 Circumferential Edema Measurements - multi extremities []  - 0 Nutritional Assessment / Counseling / Intervention []  - 0 Lower Extremity Assessment (monofilament, tuning fork, pulses) []  - 0 Peripheral Arterial Disease Assessment (using hand held doppler) ASSESSMENTS - Ostomy and/or Continence Assessment and Care []  - 0 Incontinence Assessment and Management []  - 0 Ostomy Care Assessment and Management (repouching, etc.) PROCESS - Coordination of Care X - Simple Patient / Family Education for ongoing care 1 15 []  - 0 Complex (extensive) Patient / Family Education for ongoing care X- 1 10 Staff obtains Chiropractor, Records, T Results / Process Orders est []  - 0 Staff telephones HHA, Nursing Homes / Clarify orders / etc []  - 0 Routine Transfer to another Facility (non-emergent condition) []  - 0 Routine Hospital Admission (non-emergent condition) []  - 0 New Admissions / Manufacturing engineer / Ordering NPWT Apligraf, etc. , []  - 0 Emergency Hospital Admission (emergent condition) X- 1 10 Simple Discharge Coordination []  - 0 Complex (extensive) Discharge Coordination PROCESS - Special Needs []  - 0 Pediatric / Minor Patient Management []  - 0 Isolation Patient Management []  - 0 Hearing / Language / Visual special needs []  - 0 Assessment of Community assistance (transportation, D/C planning, etc.) []  - 0 Additional assistance / Altered mentation []  - 0 Support Surface(s) Assessment (bed, cushion, seat, etc.) INTERVENTIONS - Wound Cleansing / Measurement X - Simple Wound Cleansing - one wound 1 5 []  - 0 Complex Wound Cleansing - multiple  wounds X- 1 5 Wound Imaging (photographs - any number of wounds) []  - 0 Wound Tracing (instead of photographs) X- 1  5 Simple Wound Measurement - one wound []  - 0 Complex Wound Measurement - multiple wounds INTERVENTIONS - Wound Dressings []  - 0 Small Wound Dressing one or multiple wounds X- 1 15 Medium Wound Dressing one or multiple wounds []  - 0 Large Wound Dressing one or multiple wounds []  - 0 Application of Medications - topical []  - 0 Application of Medications - injection INTERVENTIONS - Miscellaneous []  - 0 External ear exam []  - 0 Specimen Collection (cultures, biopsies, blood, body fluids, etc.) []  - 0 Specimen(s) / Culture(s) sent or taken to Lab for analysis Correll, Aracelly B ([id number]) [id number].pdf Page 3 of 9 []  - 0 Patient Transfer (multiple staff / Nurse, adult / Similar devices) []  - 0 Simple Staple / Suture removal (25 or less) []  - 0 Complex Staple / Suture removal (26 or more) []  - 0 Hypo / Hyperglycemic Management (close monitor of Blood Glucose) []  - 0 Ankle / Brachial Index (ABI) - do not check if billed separately X- 1 5 Vital Signs Has the patient been seen at the hospital within the last three years: Yes Total Score: 90 Level Of Care: New/Established - Level 3 Electronic Signature(s) Signed: 10/31/2022 4:43:06 PM By: Midge Aver MSN RN CNS WTA Entered By: Midge Aver on 10/28/2022 14:33:01 -------------------------------------------------------------------------------- Encounter Discharge Information Details Patient Name: Date of Service: Rich Number NN B. 10/28/2022 8:30 A M Medical Record Number: [id number] Patient Account Number: [id number] Date of Birth/Sex: Treating RN: 11-Jul-1940 (82 y.o. Female) Midge Aver Primary Care Annahi Short: Hillard Danker Other Clinician: Referring Barry Faircloth: Treating Raiford Fetterman/Extender: RO BSO N, MICHA EL Rolinda Roan Weeks in Treatment: 1 Encounter Discharge Information Items Discharge  Condition: Stable Ambulatory Status: Walker Discharge Destination: Home Transportation: Private Auto Accompanied By: son Schedule Follow-up Appointment: Yes Clinical Summary of Care: Electronic Signature(s) Signed: 10/31/2022 4:43:06 PM By: Midge Aver MSN RN CNS WTA Entered By: Midge Aver on 10/28/2022 09:11:30 -------------------------------------------------------------------------------- Lower Extremity Assessment Details Patient Name: Date of Service: JACQUELI, LIMONES NN B. 10/28/2022 8:30 A M Medical Record Number: [id number] Patient Account Number: [id number] Date of Birth/Sex: Treating RN: 03-05-1941 (82 y.o. Female) Midge Aver Primary Care Ardyth Kelso: Hillard Danker Other Clinician: Pricilla Holm, Charnette B ([id number]) [phone].pdf Page 4 of 9 Referring Keilin Gamboa: Treating Demaris Leavell/Extender: RO BSO N, MICHA EL Rolinda Roan Weeks in Treatment: 1 Electronic Signature(s) Signed: 10/31/2022 4:43:06 PM By: Midge Aver MSN RN CNS WTA Entered By: Midge Aver on 10/28/2022 08:59:17 -------------------------------------------------------------------------------- Multi Wound Chart Details Patient Name: Date of Service: Rich Number NN B. 10/28/2022 8:30 A M Medical Record Number: [id number] Patient Account Number: [id number] Date of Birth/Sex: Treating RN: June 30, 1940 (82 y.o. Female) Midge Aver Primary Care Aiyah Scarpelli: Hillard Danker Other Clinician: Referring Tagg Eustice: Treating Bettie Capistran/Extender: RO BSO N, MICHA EL [name], [name] Weeks in Treatment: 1 Vital Signs Height(in): Pulse(bpm): 67 Weight(lbs): Blood Pressure(mmHg): 134/49 Body Mass Index(BMI): Temperature(F): 98.4 Respiratory Rate(breaths/min): 18 [1:Photos:] [N/A:N/A] Midline Coccyx N/A N/A Wound Location: Pressure Injury N/A N/A Wounding Event: Pressure Ulcer N/A N/A Primary Etiology: Hypertension, Received N/A N/A Comorbid History: Chemotherapy 08/30/2022 N/A N/A Date Acquired: 1 N/A N/A Weeks of  Treatment: Open N/A N/A Wound Status: No N/A N/A Wound Recurrence: 8.4x5.4x2.2 N/A N/A Measurements L x W x D (cm) 35.626 N/A N/A A (cm) : rea 78.376 N/A N/A Volume (cm) : 17.90% N/A N/A % Reduction in A rea: 27.80% N/A N/A % Reduction in Volume: 9 Position 1 (o'clock): 4.2 Maximum Distance 1 (cm): 11 Starting Position 1 (o'clock): 1 Ending Position 1 (o'clock): 1.8  Maximum Distance 1 (cm): Yes N/A N/A Tunneling: Yes N/A N/A Undermining: Category/Stage IV N/A N/A Classification: Medium N/A N/A Exudate A mount: Serosanguineous N/A N/A Exudate Type: red, brown N/A N/A Exudate Color: Medium (34-66%) N/A N/A Granulation A mount: Red, Pink N/A N/A Granulation Quality: None Present (0%) N/A N/A Necrotic A mount: Fat Layer (Subcutaneous Tissue): Yes N/A N/A Exposed Structures: Gayton, Shifa B ([id number]) [id number] Page 5 of 9 Bone: Yes Fascia: No Tendon: No Muscle: No Joint: No Treatment Notes Wound #1 (Coccyx) Wound Laterality: Midline Cleanser Dakin 16 (oz) 0.25 Discharge Instruction: Use as directed. Soap and Water Discharge Instruction: Gently cleanse wound with antibacterial soap, rinse and pat dry prior to dressing wounds Peri-Wound Care Topical Nystatin Cream, 15 (g) tube Primary Dressing Gauze Discharge Instruction: As directed: dry, moistened with saline or moistened with Dakins Solution Secondary Dressing ABD Pad 5x9 (in/in) Discharge Instruction: Cover with ABD pad Secured With Medipore T - 31M Medipore H Soft Cloth Surgical T ape ape, 2x2 (in/yd) Compression Wrap Compression Stockings Add-Ons Electronic Signature(s) Signed: 10/31/2022 4:43:06 PM By: Midge Aver MSN RN CNS WTA Entered By: Midge Aver on 10/28/2022 14:27:50 -------------------------------------------------------------------------------- Multi-Disciplinary Care Plan Details Patient Name: Date of Service: Rich Number NN B. 10/28/2022 8:30 A  M Medical Record Number: [id number] Patient Account Number: [id number] Date of Birth/Sex: Treating RN: 01-23-1941 (82 y.o. Female) Midge Aver Primary Care Timberlynn Kizziah: Hillard Danker Other Clinician: Referring Salvator Seppala: Treating Dajha Urquilla/Extender: RO BSO N, MICHA EL [name], [name] Weeks in Treatment: 1 Active Inactive Pressure Nursing Diagnoses: Knowledge deficit related to causes and risk factors for pressure ulcer development Knowledge deficit related to management of pressures ulcers Goals: CRAIGIE, Shelbia B ([id number]) [id number] Page 6 of 9 Patient will remain free from development of additional pressure ulcers Date Initiated: 10/21/2022 Target Resolution Date: 11/21/2022 Goal Status: Active Patient will remain free of pressure ulcers Date Initiated: 10/21/2022 Target Resolution Date: 11/21/2022 Goal Status: Active Patient/caregiver will verbalize risk factors for pressure ulcer development Date Initiated: 10/21/2022 Target Resolution Date: 11/21/2022 Goal Status: Active Patient/caregiver will verbalize understanding of pressure ulcer management Date Initiated: 10/21/2022 Target Resolution Date: 11/21/2022 Goal Status: Active Interventions: Assess: immobility, friction, shearing, incontinence upon admission and as needed Assess offloading mechanisms upon admission and as needed Assess potential for pressure ulcer upon admission and as needed Provide education on pressure ulcers Notes: Wound/Skin Impairment Nursing Diagnoses: Impaired tissue integrity Knowledge deficit related to ulceration/compromised skin integrity Goals: Patient/caregiver will verbalize understanding of skin care regimen Date Initiated: 10/21/2022 Target Resolution Date: 11/21/2022 Goal Status: Active Ulcer/skin breakdown will have a volume reduction of 30% by week 4 Date Initiated: 10/21/2022 Target Resolution Date: 11/21/2022 Goal Status: Active Ulcer/skin breakdown will have a volume  reduction of 50% by week 8 Date Initiated: 10/21/2022 Target Resolution Date: 12/22/2022 Goal Status: Active Ulcer/skin breakdown will have a volume reduction of 80% by week 12 Date Initiated: 10/21/2022 Target Resolution Date: 01/21/2023 Goal Status: Active Ulcer/skin breakdown will heal within 14 weeks Date Initiated: 10/21/2022 Target Resolution Date: 02/04/2023 Goal Status: Active Interventions: Assess patient/caregiver ability to obtain necessary supplies Assess patient/caregiver ability to perform ulcer/skin care regimen upon admission and as needed Assess ulceration(s) every visit Provide education on ulcer and skin care Treatment Activities: Patient referred to home care : 10/21/2022 Skin care regimen initiated : 10/21/2022 Topical wound management initiated : 10/21/2022 Notes: Electronic Signature(s) Signed: 10/31/2022 4:43:06 PM By: Midge Aver MSN RN CNS WTA Entered By: Midge Aver on 10/28/2022 14:33:21 Hnat, Casidee B ([id number]) [id number] Page 7  of 9 -------------------------------------------------------------------------------- Pain Assessment Details Patient Name: Date of Service: DEIONNA, SCRIBNER NN B. 10/28/2022 8:30 A M Medical Record Number: [id number] Patient Account Number: [id number] Date of Birth/Sex: Treating RN: 30-Jun-1940 (82 y.o. Female) Midge Aver Primary Care Orval Dortch: Hillard Danker Other Clinician: Referring Peytin Dechert: Treating Gavinn Collard/Extender: RO BSO N, MICHA EL [name], [name] Weeks in Treatment: 1 Active Problems Location of Pain Severity and Description of Pain Patient Has Paino No Site Locations Pain Management and Medication Current Pain Management: Electronic Signature(s) Signed: 10/31/2022 4:43:06 PM By: Midge Aver MSN RN CNS WTA Entered By: Midge Aver on 10/28/2022 08:56:49 -------------------------------------------------------------------------------- Patient/Caregiver Education Details Patient Name: Date of  Service: Myra Gianotti 7/22/2024andnbsp8:30 A M Medical Record Number: [id number] Patient Account Number: [id number] Date of Birth/Gender: Treating RN: Sep 23, 1940 (82 y.o. Female) Midge Aver Primary Care Physician: Hillard Danker Other Clinician: Referring Physician: Treating Physician/Extender: RO BSO N, MICHA EL Rolinda Roan Weeks in Treatment: 1 Education Assessment Education Provided To: Patient Education Topics Provided Wound/Skin Impairment: Handouts: Caring for Your Ulcer Methods: Explain/Verbal Responses: State content correctly Archbald, Jaleiyah B ([id number]) [phone].pdf Page 8 of 9 Electronic Signature(s) Signed: 10/31/2022 4:43:06 PM By: Midge Aver MSN RN CNS WTA Entered By: Midge Aver on 10/28/2022 14:33:26 -------------------------------------------------------------------------------- Wound Assessment Details Patient Name: Date of Service: SIRINA, YANDOW NN B. 10/28/2022 8:30 A M Medical Record Number: [id number] Patient Account Number: [id number] Date of Birth/Sex: Treating RN: 1941-04-03 (82 y.o. Female) Midge Aver Primary Care Lora Glomski: Hillard Danker Other Clinician: Referring Teyanna Thielman: Treating Chrisie Jankovich/Extender: RO BSO N, MICHA EL [name], [name] Weeks in Treatment: 1 Wound Status Wound Number: 1 Primary Etiology: Pressure Ulcer Wound Location: Midline Coccyx Wound Status: Open Wounding Event: Pressure Injury Comorbid History: Hypertension, Received Chemotherapy Date Acquired: 08/30/2022 Weeks Of Treatment: 1 Clustered Wound: No Photos Wound Measurements Length: (cm) 8.4 Width: (cm) 5.4 Depth: (cm) 2.2 Area: (cm) 35.626 Volume: (cm) 78.376 % Reduction in Area: 17.9% % Reduction in Volume: 27.8% Tunneling: Yes Position (o'clock): 9 Maximum Distance: (cm) 4.2 Undermining: Yes Starting Position (o'clock): 11 Ending Position (o'clock): 1 Maximum Distance: (cm) 1.8 Wound Description Classification: Category/Stage IV Exudate  Amount: Medium Exudate Type: Serosanguineous Exudate Color: red, brown Foul Odor After Cleansing: No Slough/Fibrino No Wound Bed Granulation Amount: Medium (34-66%) Exposed Structure Granulation Quality: Red, Pink Fascia Exposed: No Necrotic Amount: None Present (0%) Fat Layer (Subcutaneous Tissue) Exposed: Yes Schramm, Leotha B ([id number]) [id number] Page 9 of 9 Tendon Exposed: No Muscle Exposed: No Joint Exposed: No Bone Exposed: Yes Treatment Notes Wound #1 (Coccyx) Wound Laterality: Midline Cleanser Dakin 16 (oz) 0.25 Discharge Instruction: Use as directed. Soap and Water Discharge Instruction: Gently cleanse wound with antibacterial soap, rinse and pat dry prior to dressing wounds Peri-Wound Care Topical Nystatin Cream, 15 (g) tube Primary Dressing Gauze Discharge Instruction: As directed: dry, moistened with saline or moistened with Dakins Solution Secondary Dressing ABD Pad 5x9 (in/in) Discharge Instruction: Cover with ABD pad Secured With Medipore T - 35M Medipore H Soft Cloth Surgical T ape ape, 2x2 (in/yd) Compression Wrap Compression Stockings Add-Ons Electronic Signature(s) Signed: 10/31/2022 4:43:06 PM By: Midge Aver MSN RN CNS WTA Entered By: Midge Aver on 10/28/2022 08:59:09 -------------------------------------------------------------------------------- Vitals Details Patient Name: Date of Service: Rich Number NN B. 10/28/2022 8:30 A M Medical Record Number: [id number] Patient Account Number: [id number] Date of Birth/Sex: Treating RN: [birth year] (82 y.o. Female) Midge Aver Primary Care Jailyne Chieffo: Hillard Danker Other Clinician: Referring Tremond Shimabukuro: Treating Luke Falero/Extender: RO BSO N, MICHA EL [name], [name] Weeks in Treatment:  1 Vital Signs Time Taken: 08:56 Temperature (F): 98.4 Pulse (bpm): 67 Respiratory Rate (breaths/min): 18 Blood Pressure (mmHg): 134/49 Reference Range: 80 - 120 mg / dl Electronic  Signature(s) Signed: 10/31/2022 4:43:06 PM By: Midge Aver MSN RN CNS WTA Entered By: Midge Aver on 10/28/2022 08:56:42

## 2022-11-01 NOTE — Progress Notes (Signed)
BODOH, Aleyssa B ([id number]) [id number] Page 1 of 6 Visit Report for 10/28/2022 Chief Complaint Document Details Patient Name: Date of Service: VEVELYN, MILLIRONS NN B. 10/28/2022 8:30 A M Medical Record Number: [id number] Patient Account Number: [id number] Date of Birth/Sex: Treating RN: March 24, 1941 (82 y.o. Female) Midge Aver Primary Care Provider: Hillard Danker Other Clinician: Referring Provider: Treating Provider/Extender: RO BSO N, MICHA EL Rolinda Roan Weeks in Treatment: 1 Information Obtained from: Patient Chief Complaint Stage 4 pressure ulcer Electronic Signature(s) Signed: 10/30/2022 10:44:35 AM By: Midge Aver MSN RN CNS WTA Signed: 11/01/2022 7:34:23 AM By: Baltazar Najjar MD Previous Signature: 10/29/2022 4:43:46 PM Version By: Baltazar Najjar MD Entered By: Midge Aver on 10/30/2022 10:44:35 -------------------------------------------------------------------------------- HPI Details Patient Name: Date of Service: Rich Number NN B. 10/28/2022 8:30 A M Medical Record Number: [id number] Patient Account Number: [id number] Date of Birth/Sex: Treating RN: May 30, 1940 (82 y.o. 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 Currently there is not direct bone exposure because she is now covered with granulation tissue her family has been taking care of her at home at this point although she was previously in a facility. This is something that was noted when she ended up having to go to the ER initially on Aug 30, 2022. Subsequently during hospital stent it was started that she had gentamicin  applied topically along with Dakin's moistened gauze packing which has been done at home now and seems to be doing very well. She was at [facility] skilled nursing facility for time. She did have Levaquin previous which she did do very well for her as far as get the infection under control in my opinion I think she is actually seeming to do quite well at this point. She is not having any pain which is also excellent news at this time. She is starting to get around more so she is not completely bedbound although she is a little weak she does have family that is very attentive and taking great care of her. 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 There is an area of bone that is very thinly covered. Been using Dakin's wet-to-dry dressings. She has in-home caregivers 24/7 and also home health coming out to change dressings/participate in dressing supervision although most of her dressing changes are being done by her own in-home caregivers. RAYGOR, Cherylanne B ([id number]) [id number] Page 2 of 6 . Electronic Signature(s) Signed: 10/30/2022 10:47:41 AM By: Midge Aver MSN RN CNS WTA Signed: 11/01/2022 7:34:23 AM By: Baltazar Najjar MD Previous Signature: 10/29/2022 4:43:46 PM Version By: Baltazar Najjar MD Entered By: Midge Aver on 10/30/2022 10:47:41 -------------------------------------------------------------------------------- Physical Exam Details Patient Name: Date of Service: LILLIS, BEIER NN B. 10/28/2022 8:30 A M Medical Record Number: [id number] Patient Account Number: [id number] Date of Birth/Sex: Treating RN: 1940/10/23 (82 y.o. Female) Midge Aver Primary Care Provider: Hillard Danker Other Clinician: Referring Provider: Treating Provider/Extender: RO BSO N, MICHA EL [name], [name] Weeks in  Treatment: 1 Constitutional Sitting or standing Blood Pressure is within target range for  patient.. Pulse regular and within target range for patient.Marland Kitchen Respirations regular, non-labored and within target range.. Temperature is normal and within the target range for the patient.Marland Kitchen appears in no distress. Notes Wound exam; fairly substantial wound in the lower sacral area. Midline part of the wound has close to exposed bone in this area. Some minor undermining superiorly. T the left of the center part of the wound there is a tunneling area however this does not probe deeply. Again on the left there is what looks to be a o candidal rash around the wound circumference but nothing that looks particularly bacterial in etiology. Electronic Signature(s) Signed: 10/30/2022 10:48:07 AM By: Midge Aver MSN RN CNS WTA Signed: 11/01/2022 7:34:23 AM By: Baltazar Najjar MD Previous Signature: 10/29/2022 4:43:46 PM Version By: Baltazar Najjar MD Entered By: Midge Aver on 10/30/2022 10:48:07 -------------------------------------------------------------------------------- Physician Orders Details Patient Name: Date of Service: DUANA, LAUTT NN B. 10/28/2022 8:30 A M Medical Record Number: [id number] Patient Account Number: [id number] Date of Birth/Sex: Treating RN: [birth year] (82 y.o. Female) Midge Aver Primary Care Provider: Hillard Danker Other Clinician: Referring Provider: Treating Provider/Extender: RO BSO N, MICHA EL [name], [name] Weeks in Treatment: 1 Verbal / Phone Orders: No Diagnosis Coding Follow-up Appointments Return Appointment in 1 week. DUNNAWAY, Darrion B ([id number]) [id number] Page 3 of 6 Home Health CONTINUE Home Health for wound care. May utilize formulary equivalent dressing for wound treatment orders unless otherwise specified. Home Health Nurse may visit PRN to address patients wound care needs. - [facility]--please change dressing with patient lying down in  bed. Turn to the side of patient's choice. Clean wound with SALINE. Apply Nystatin to surrounding area and then apply a gauze moistened (not soaked) with Dakin's solution. Cover with a dry gauze then secure with ABD and tape Bathing/ Shower/ Hygiene May shower; gently cleanse wound with antibacterial soap, rinse and pat dry prior to dressing wounds Off-Loading Low air-loss mattress (Group 2) Turn and reposition every 2 hours Negative Pressure Wound Therapy Other: - NPWT ordered Wound Treatment Wound #1 - Coccyx Wound Laterality: Midline Cleanser: Dakin 16 (oz) 0.25 (Home Health) 1 x Per Day/30 Days Discharge Instructions: Moisten gauze and place on the wound Cleanser: Normal Saline (Home Health) 1 x Per Day/30 Days Discharge Instructions: Wash your hands with soap and water. Remove old dressing, discard into plastic bag and place into trash. Cleanse the wound with Normal Saline prior to applying a clean dressing using gauze sponges, not tissues or cotton balls. Do not scrub or use excessive force. Pat dry using gauze sponges, not tissue or cotton balls. Topical: Nystatin Cream, 15 (g) tube (Home Health) 1 x Per Day/30 Days Prim Dressing: Gauze (Home Health) 1 x Per Day/30 Days ary Discharge Instructions: As directed: dry, moistened with saline or moistened with Dakins Solution Secondary Dressing: ABD Pad 5x9 (in/in) (Home Health) 1 x Per Day/30 Days Discharge Instructions: Cover with ABD pad Secured With: Medipore T - 34M Medipore H Soft Cloth Surgical T ape ape, 2x2 (in/yd) (Home Health) 1 x Per Day/30 Days Patient Medications llergies: vancomycin, lactose, Lipitor, tramadol, pravastatin, Zetia, codeine A Notifications Medication Indication Start End 10/28/2022 nystatin DOSE topical 100,000 unit/gram cream - cream topical twice a day Notes Order wound vac Electronic Signature(s) Signed: 10/31/2022 4:43:06 PM By: Midge Aver MSN RN CNS WTA Signed: 11/01/2022 7:34:23 AM By: Baltazar Najjar MD Previous Signature: 10/29/2022 4:43:46 PM Version By: Baltazar Najjar MD Previous Signature: 10/28/2022 10:04:57 AM Version By: Baltazar Najjar MD Entered By: Katrinka Blazing,  Chip Boer on 10/30/2022 10:51:29 -------------------------------------------------------------------------------- Problem List Details Patient Name: Date of Service: ISHANA, FOSNAUGH NN B. 10/28/2022 8:30 A M Medical Record Number: [id number] Patient Account Number: [id number] Date of Birth/Sex: Treating RN: 02/28/1941 (82 y.o. Female) Midge Aver Primary Care Provider: Hillard Danker Other Clinician: Referring Provider: Treating Provider/Extender: [address], MICHA EL Rolinda Roan [place], Khadeejah B ([id number]) [phone].pdf Page 4 of 6 Weeks in Treatment: 1 Active Problems ICD-10 Encounter Code Description Active Date MDM Diagnosis L89.154 Pressure ulcer of sacral region, stage 4 10/21/2022 No Yes M62.81 Muscle weakness (generalized) 10/21/2022 No Yes C90.00 Multiple myeloma not having achieved remission 10/21/2022 No Yes Inactive Problems Resolved Problems Electronic Signature(s) Signed: 10/29/2022 4:43:46 PM By: Baltazar Najjar MD Signed: 10/31/2022 4:43:06 PM By: Midge Aver MSN RN CNS WTA Entered By: Midge Aver on 10/28/2022 14:48:42 -------------------------------------------------------------------------------- Progress Note Details Patient Name: Date of Service: Rich Number NN B. 10/28/2022 8:30 A M Medical Record Number: [id number] Patient Account Number: [id number] Date of Birth/Sex: Treating RN: 07-26-1940 (82 y.o. 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 Currently there is not direct bone exposure because she is now covered with granulation tissue her family has been taking care of her at home at this point although she was previously in a facility. This is something that was noted when she ended up having to go to the ER initially on Aug 30, 2022. Subsequently during hospital stent it was started that she had gentamicin applied topically along with Dakin's moistened gauze packing which has been done at home now and seems to be doing very well. She was at [facility] skilled nursing facility for time. She did have Levaquin previous which she did do very well for her as far as get the infection under control in my opinion I think she is actually seeming to do quite well at this point. She is not having any pain which is also excellent news at this time. She is starting to get around more so she is not completely bedbound although she is a little weak she does have family that is very attentive and taking great care of her. 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 There is an area of bone that is very thinly covered. Been using Dakin's wet-to-dry dressings. She has in-home caregivers 24/7 and also home health coming out to change dressings/participate in dressing supervision although most of her dressing changes are being done by her own in-home caregivers. Pricilla Holm, Malyn B ([id number]) [id number] Page 5 of 6 Objective Constitutional Sitting or standing Blood Pressure is within target range for patient.. Pulse regular and within target range for patient.Marland Kitchen Respirations regular, non-labored and within target range.. Temperature is  normal and within the target range for the patient.Marland Kitchen appears in no distress. Vitals Time Taken: 8:56 AM, Temperature: 98.4 F, Pulse: 67 bpm, Respiratory Rate: 18 breaths/min, Blood Pressure: 134/49 mmHg. General Notes: Wound exam; fairly substantial wound in the lower sacral area. Midline  part of the wound has close to exposed bone in this area. Some minor undermining superiorly. T the left of the center part of the wound there is a tunneling area however this does not probe deeply. Again on the left there is what o looks to be a candidal rash around the wound circumference but nothing that looks particularly bacterial in etiology. Integumentary (Hair, Skin) Wound #1 status is Open. Original cause of wound was Pressure Injury. The date acquired was: 08/30/2022. The wound has been in treatment 1 weeks. The wound is located on the Midline Coccyx. The wound measures 8.4cm length x 5.4cm width x 2.2cm depth; 35.626cm^2 area and 78.376cm^3 volume. There is bone and Fat Layer (Subcutaneous Tissue) exposed. Tunneling has been noted at 9:00 with a maximum distance of 4.2cm. Undermining begins at 11:00 and ends at 1:00 with a maximum distance of 1.8cm. There is a medium amount of serosanguineous drainage noted. There is medium (34-66%) red, pink granulation within the wound bed. There is no necrotic tissue within the wound bed. Assessment Active Problems ICD-10 Pressure ulcer of sacral region, stage 4 Muscle weakness (generalized) Multiple myeloma not having achieved remission Plan Follow-up Appointments: Return Appointment in 1 week. Home Health: [facility] for wound care. May utilize formulary equivalent dressing for wound treatment orders unless otherwise specified. Home Health Nurse may visit PRN to address patients wound care needs. - [facility]--please change dressing with patient lying down in bed. Turn to the side of patient's choice. Clean wound with antibacterial soap and water or  wound cleanser. Apply Nystatin to surrounding area and then apply a gauze moistened (not soaked) with Dakin's solution. Cover with a dry gauze then secure with ABD and tape Bathing/ Shower/ Hygiene: May shower; gently cleanse wound with antibacterial soap, rinse and pat dry prior to dressing wounds Off-Loading: Low air-loss mattress (Group 2) Turn and reposition every 2 hours WOUND #1: - Coccyx Wound Laterality: Midline Cleanser: Dakin 16 (oz) 0.25 (Home Health) 1 x Per Day/30 Days Discharge Instructions: Use as directed. Cleanser: Soap and Water (Home Health) 1 x Per Day/30 Days Discharge Instructions: Gently cleanse wound with antibacterial soap, rinse and pat dry prior to dressing wounds Topical: Nystatin Cream, 15 (g) tube 1 x Per Day/30 Days Prim Dressing: Gauze (Home Health) 1 x Per Day/30 Days ary Discharge Instructions: As directed: dry, moistened with saline or moistened with Dakins Solution Secondary Dressing: ABD Pad 5x9 (in/in) 1 x Per Day/30 Days Discharge Instructions: Cover with ABD pad Secured With: Medipore T - 53M Medipore H Soft Cloth Surgical T ape ape, 2x2 (in/yd) 1 x Per Day/30 Days 1. We continued Dakin's wet-to-dry dressing 2. I think this is ready for a wound VAC and we will put this order in in conjunction with her home health provider. 3. Nystatin would be the correct treatment for the rash on the left side of the wound. Apparently this was already put in at some level and is apparently on backorder. Clotrimazole would also be effective 4. Emphasized continuous pressure relief Electronic Signature(s) Signed: 10/30/2022 10:49:26 AM By: Midge Aver MSN RN CNS WTA Signed: 11/01/2022 7:34:23 AM By: Baltazar Najjar MD Previous Signature: 10/29/2022 4:43:46 PM Version By: Baltazar Najjar MD Delossantos, Jeane B ([id number]) [phone].pdf Page 6 of 6 Entered By: Midge Aver on 10/30/2022  10:49:26 -------------------------------------------------------------------------------- SuperBill Details Patient Name: Date of Service: JOYCELENE, GIACOMELLI 10/28/2022 Medical Record Number: [id number] Patient Account Number: [id number] Date of Birth/Sex: Treating RN: 03-Oct-1940 (82 y.o. Female) Midge Aver  Primary Care Provider: Hillard Danker Other Clinician: Referring Provider: Treating Provider/Extender: RO BSO N, MICHA EL [name], [name] Weeks in Treatment: 1 Diagnosis Coding ICD-10 Codes Code Description L89.154 Pressure ulcer of sacral region, stage 4 M62.81 Muscle weakness (generalized) C90.00 Multiple myeloma not having achieved remission Facility Procedures : 7 CPT4 Code: [id number] Description: 99213 - WOUND CARE VISIT-LEV 3 EST PT Modifier: Quantity: 1 Physician Procedures : CPT4 Code Description Modifier [id number] 99214 - WC PHYS LEVEL 4 - EST PT ICD-10 Diagnosis Description L89.154 Pressure ulcer of sacral region, stage 4 Quantity: 1 Electronic Signature(s) Signed: 10/30/2022 10:49:35 AM By: Midge Aver MSN RN CNS WTA Signed: 11/01/2022 7:34:23 AM By: Baltazar Najjar MD Previous Signature: 10/29/2022 4:43:46 PM Version By: Baltazar Najjar MD Entered By: Midge Aver on 10/30/2022 10:49:35

## 2022-11-02 DIAGNOSIS — C7951 Secondary malignant neoplasm of bone: Secondary | ICD-10-CM | POA: Diagnosis not present

## 2022-11-02 DIAGNOSIS — C9000 Multiple myeloma not having achieved remission: Secondary | ICD-10-CM | POA: Diagnosis not present

## 2022-11-02 DIAGNOSIS — I129 Hypertensive chronic kidney disease with stage 1 through stage 4 chronic kidney disease, or unspecified chronic kidney disease: Secondary | ICD-10-CM | POA: Diagnosis not present

## 2022-11-02 DIAGNOSIS — E43 Unspecified severe protein-calorie malnutrition: Secondary | ICD-10-CM | POA: Diagnosis not present

## 2022-11-02 DIAGNOSIS — D630 Anemia in neoplastic disease: Secondary | ICD-10-CM | POA: Diagnosis not present

## 2022-11-02 DIAGNOSIS — I824Z2 Acute embolism and thrombosis of unspecified deep veins of left distal lower extremity: Secondary | ICD-10-CM | POA: Diagnosis not present

## 2022-11-02 DIAGNOSIS — I272 Pulmonary hypertension, unspecified: Secondary | ICD-10-CM | POA: Diagnosis not present

## 2022-11-02 DIAGNOSIS — G893 Neoplasm related pain (acute) (chronic): Secondary | ICD-10-CM | POA: Diagnosis not present

## 2022-11-02 DIAGNOSIS — L89154 Pressure ulcer of sacral region, stage 4: Secondary | ICD-10-CM | POA: Diagnosis not present

## 2022-11-04 ENCOUNTER — Telehealth: Payer: Self-pay | Admitting: *Deleted

## 2022-11-04 ENCOUNTER — Encounter: Payer: Medicare PPO | Admitting: Physician Assistant

## 2022-11-04 DIAGNOSIS — L89154 Pressure ulcer of sacral region, stage 4: Secondary | ICD-10-CM | POA: Diagnosis not present

## 2022-11-04 DIAGNOSIS — C9000 Multiple myeloma not having achieved remission: Secondary | ICD-10-CM

## 2022-11-04 DIAGNOSIS — I10 Essential (primary) hypertension: Secondary | ICD-10-CM | POA: Diagnosis not present

## 2022-11-04 DIAGNOSIS — B372 Candidiasis of skin and nail: Secondary | ICD-10-CM | POA: Diagnosis not present

## 2022-11-04 MED ORDER — DEXAMETHASONE 2 MG PO TABS
2.0000 mg | ORAL_TABLET | Freq: Every day | ORAL | 1 refills | Status: AC
Start: 2022-11-04 — End: ?

## 2022-11-04 NOTE — Telephone Encounter (Signed)
Son [name] contacted office - requested refill of Dexamethasone 2 mg po that patient takes daily.  Refilled per Verbal order from C. Heilingoetter, PA

## 2022-11-04 NOTE — Progress Notes (Signed)
ROSEMAN, [name] ([id number]) [id number] Page 1 of 7 Visit Report for 11/04/2022 Chief Complaint Document Details Patient Name: Date of Service: [name], [name] 11/04/2022 12:15 PM Medical Record Number: [id number] Patient Account Number: [id number] Date of Birth/Sex: Treating RN: Nov 11, 1940 (82 y.o. [name] Primary Care Provider: Hillard [name] Other Clinician: Referring Provider: Treating Provider/Extender: [name] in Treatment: 2 Information Obtained from: Patient Chief Complaint Stage 4 pressure ulcer Electronic Signature(s) Signed: 11/04/2022 12:43:16 PM By: [name] Derry PA-C Entered By: [name] on 11/04/2022 12:43:15 -------------------------------------------------------------------------------- HPI Details Patient Name: Date of Service: [name] Number [name] 11/04/2022 12:15 PM Medical Record Number: [id number] Patient Account Number: [id number] Date of Birth/Sex: Treating RN: 1941-02-18 (82 y.o. 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 Currently there is not direct bone exposure because she is now covered with granulation tissue her family has been taking care of her at home at this point although she was previously in a facility. This is something that was noted when she ended up having to go to the ER initially on Aug 30, 2022. Subsequently during hospital stent it was started that she had gentamicin applied topically along with Dakin's moistened gauze packing which has been done at home now and seems to be doing very well. She was at [facility] skilled nursing  facility for time. She did have Levaquin previous which she did do very well for her as far as get the infection under control in my opinion I think she is actually seeming to do quite well at this point. She is not having any pain which is also excellent news at this time. She is starting to get around more so she is not completely bedbound although she is a little weak she does have family that is very attentive and taking great care of her. 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 There is an area of bone that is very thinly covered. Been using Dakin's wet-to-dry dressings. She has in-home caregivers 24/7 and also home health coming out to change dressings/participate in dressing supervision although most of her dressing changes are being done by her own in-home caregivers. 11-04-2022 upon evaluation today patient appears to be doing well currently in regard to her wound. With that being said I do feel like she is making really good progress towards healing currently. I do not see any evidence of active infection locally nor systemically which is excellent news as well. No fevers, chills, [name], [name] ([id number]) [id number].pdf Page 2 of 7 nausea, vomiting, or diarrhea. Electronic Signature(s) Signed: 11/04/2022 1:28:28 PM By: [name] Derry PA-C Entered By: [name] on 11/04/2022 13:28:28 -------------------------------------------------------------------------------- Physical Exam Details Patient Name: Date of Service: [name], [name] [name] 11/04/2022 12:15 PM Medical Record Number: [id number] Patient Account Number: [id number] Date of Birth/Sex: Treating RN: 1941-02-25 (82 y.o. [name] Primary Care Provider: Hillard [name] Other Clinician: Referring Provider: Treating Provider/Extender:  [name] in Treatment: 2 Constitutional Well-nourished and well-hydrated in no acute distress. Respiratory normal breathing  without difficulty. Psychiatric this patient is able to make decisions and demonstrates good insight into disease process. Alert and Oriented x 3. pleasant and cooperative. Notes Upon inspection patient's wound bed showed signs of good granulation and epithelization at this point. Fortunately I see no signs of infection or worsening overall and I do believe that she is doing well. We have apparently as of last week ordered the wound VAC for her she does have that available today at home health is going to be coming out to put that on later today. I do believe that she likely needs to have DuoDERM in the area between the distal portion of the wound and the anus. This will hopefully help maintain the seal. Otherwise I am somewhat concerned that she is not going to be able to maintain the seal this is not going to stay in place. Subsequently other than this also I think that the patient does need to make sure to appropriately offload she is able to get up and move around she seems to be doing a good job with that I see no issues in that regard my hope is that the wound VAC will help this to heal much more effectively and quickly. Electronic Signature(s) Signed: 11/04/2022 1:29:34 PM By: [name] Derry PA-C Entered By: [name] on 11/04/2022 13:29:34 -------------------------------------------------------------------------------- Physician Orders Details Patient Name: Date of Service: [name], [name] [name] 11/04/2022 12:15 PM Medical Record Number: [id number] Patient Account Number: [id number] Date of Birth/Sex: Treating RN: Jun 22, 1940 (82 y.o. [name] Primary Care Provider: Hillard [name] Other Clinician: Referring Provider: Treating Provider/Extender: [name] in Treatment: 2 Verbal / Phone Orders: [name], [name] ([id number])  [id number] Page 3 of 7 Diagnosis Coding ICD-10 Coding Code Description L89.154 Pressure ulcer of sacral region, stage 4 M62.81 Muscle weakness (generalized) C90.00 Multiple myeloma not having achieved remission Follow-up Appointments Return Appointment in 1 week. Home Health [facility] for wound care. May utilize formulary equivalent dressing for wound treatment orders unless otherwise specified. Home Health Nurse may visit PRN to address patients wound care needs. - [facility]--Remove dressing and clean the wound with normal saline, use skin barrier wipes prior to apply the drape. Apply wound vac and use the Duoderm (may cut in a U shape) sent with patient at the rectal area to help prevent a leak. Please bridge the vac from the wound up to the trochanter to prevent lying on the tubing. Change MWF. Patient will come to wound center on Monday. We will remove vac and HH RN will need to come in the afternoon to replace the vac. Please call if you have any questions. Patient must be lying in bed. Bathing/ Shower/ Hygiene May shower; gently cleanse wound with antibacterial soap, rinse and pat dry prior to dressing wounds Off-Loading Low air-loss mattress (Group 2) Turn and reposition every 2 hours Negative Pressure Wound Therapy Other: - NPWT to be applied on Monday 11/04/22 by [facility] nurse Wound Treatment Wound #1 - Coccyx Wound Laterality: Midline Cleanser: Dakin 16 (oz) 0.25 (Home Health) 1 x Per Day/30 Days Discharge Instructions: Moisten gauze and place on the wound Cleanser: Normal Saline (Home Health) 1 x Per Day/30 Days Discharge Instructions: Wash your hands with soap and water. Remove old dressing, discard into plastic bag and place into trash. Cleanse the wound with Normal Saline prior to applying a clean dressing using gauze sponges, not tissues or cotton balls. Do not scrub or use excessive force. Pat dry using gauze  sponges, not tissue  or cotton balls. Prim Dressing: Gauze (Home Health) 1 x Per Day/30 Days ary Discharge Instructions: As directed: dry, moistened with saline or moistened with Dakins Solution Secondary Dressing: ABD Pad 5x9 (in/in) (Home Health) 1 x Per Day/30 Days Discharge Instructions: Cover with ABD pad Secured With: Medipore T - 16M Medipore H Soft Cloth Surgical T ape ape, 2x2 (in/yd) (Home Health) 1 x Per Day/30 Days Patient Medications llergies: vancomycin, lactose, Lipitor, tramadol, pravastatin, Zetia, codeine A Notifications Medication Indication Start End 11/04/2022 fluconazole DOSE 1 - oral 150 mg tablet - 1 tablet oral taken 1 time per week for 3 [name] Electronic Signature(s) Signed: 11/04/2022 1:31:19 PM By: [name] Derry PA-C Entered By: [name] on 11/04/2022 13:31:19 [place], Alizeh [name] ([id number]) [id number] Page 4 of 7 -------------------------------------------------------------------------------- Problem List Details Patient Name: Date of Service: [name], [name] 11/04/2022 12:15 PM Medical Record Number: [id number] Patient Account Number: [id number] Date of Birth/Sex: Treating RN: [birth year] (82 y.o. [name] Primary Care Provider: Hillard [name] Other Clinician: Referring Provider: Treating Provider/Extender: [name] in Treatment: 2 Active Problems ICD-10 Encounter Code Description Active Date MDM Diagnosis L89.154 Pressure ulcer of sacral region, stage 4 10/21/2022 No Yes M62.81 Muscle weakness (generalized) 10/21/2022 No Yes C90.00 Multiple myeloma not having achieved remission 10/21/2022 No Yes Inactive Problems Resolved Problems Electronic Signature(s) Signed: 11/04/2022 3:00:23 PM By: [name] Derry PA-C Signed: 11/05/2022 4:41:19 PM By: Midge Aver MSN RN CNS WTA Previous Signature: 11/04/2022 12:43:11 PM Version By: [name] Derry PA-C Entered By: Midge Aver on 11/04/2022  13:02:08 -------------------------------------------------------------------------------- Progress Note Details Patient Name: Date of Service: [name] Number [name] 11/04/2022 12:15 PM Medical Record Number: [id number] Patient Account Number: [id number] Date of Birth/Sex: Treating RN: [birth year] (82 y.o. 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 Currently there is not direct bone exposure because she is now covered with granulation tissue her family has been taking care of her at home at this point although she was previously in a facility. This is something that was noted when she ended up having to go to the ER initially on Aug 30, 2022. Subsequently during hospital stent it was started that she had gentamicin applied topically along with Dakin's moistened gauze packing which has been done at home now and seems to be doing very well. She was at [facility] skilled nursing facility for time. She did have Levaquin previous which she did do very well for her as far as get the infection under control in my opinion I think she is actually seeming to do quite well at this point. She is not having any pain which is also excellent news at this time. She is starting to get around more so she is not completely bedbound although she is a little weak she does have family that is very attentive and taking great care of her. [name], [name] ([id number]) [id number] Page 5 of 7 Patient does have multiple myeloma and generalized muscle weakness but  otherwise no major medical problems currently. 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  There is an area of bone that is very thinly covered. Been using Dakin's wet-to-dry dressings. She has in-home caregivers 24/7 and also home health coming out to change dressings/participate in dressing supervision although most of her dressing changes are being done by her own in-home caregivers. 11-04-2022 upon evaluation today patient appears to be doing well currently in regard to her wound. With that being said I do feel like she is making really good progress towards healing currently. I do not see any evidence of active infection locally nor systemically which is excellent news as well. No fevers, chills, nausea, vomiting, or diarrhea. Objective Constitutional Well-nourished and well-hydrated in no acute distress. Vitals Time Taken: 12:29 PM, Temperature: 97.6 F, Pulse: 57 bpm, Respiratory Rate: 20 breaths/min, Blood Pressure: 161/51 mmHg. Respiratory normal breathing without difficulty. Psychiatric this patient is able to make decisions and demonstrates good insight into disease process. Alert and Oriented x 3. pleasant and cooperative. General Notes: Upon inspection patient's wound bed showed signs of good granulation and epithelization at this point. Fortunately I see no signs of infection or worsening overall and I do believe that she is doing well. We have apparently as of last week ordered the wound VAC for her she does have that available today at home health is going to be coming out to put that on later today. I do believe that she likely needs to have DuoDERM in the area between the distal portion of the wound and the anus. This will hopefully help maintain the seal. Otherwise I am somewhat concerned that she is not going to be able to maintain the seal this is not going to stay in place. Subsequently other than this also I think that  the patient does need to make sure to appropriately offload she is able to get up and move around she seems to be doing a good job with that I see no issues in that regard my hope is that the wound VAC will help this to heal much more effectively and quickly. Integumentary (Hair, Skin) Wound #1 status is Open. Original cause of wound was Pressure Injury. The date acquired was: 08/30/2022. The wound has been in treatment 2 [name] The wound is located on the Midline Coccyx. The wound measures 7cm length x 6cm width x 1.7cm depth; 32.987cm^2 area and 56.077cm^3 volume. There is bone and Fat Layer (Subcutaneous Tissue) exposed. There is no undermining noted, however, there is tunneling at 8:00 with a maximum distance of 2cm. There is a medium amount of serosanguineous drainage noted. There is medium (34-66%) red, pink granulation within the wound bed. There is no necrotic tissue within the wound bed. Assessment Active Problems ICD-10 Pressure ulcer of sacral region, stage 4 Muscle weakness (generalized) Multiple myeloma not having achieved remission Plan Follow-up Appointments: Return Appointment in 1 week. Home Health: [facility] for wound care. May utilize formulary equivalent dressing for wound treatment orders unless otherwise specified. Home Health Nurse may visit PRN to address patients wound care needs. - [facility]--Remove dressing and clean the wound with normal saline, use skin barrier wipes prior to apply the drape. Apply wound vac and use the Duoderm (may cut in a U shape) sent with patient at the rectal area to help prevent a leak. Please bridge the vac from the wound up to the trochanter to prevent lying on the tubing. Change MWF. Patient will come to wound center on Monday. We will remove vac and HH RN will need to come in the afternoon  to replace the vac. Please call if you have any questions. Patient must be lying in bed. Bathing/ Shower/ Hygiene: May shower; gently  cleanse wound with antibacterial soap, rinse and pat dry prior to dressing wounds Off-Loading: Low air-loss mattress (Group 2) Turn and reposition every 2 hours Negative Pressure Wound Therapy: Other: - NPWT to be applied on Monday 11/04/22 by [facility] nurse The following medication(s) was prescribed: fluconazole oral 150 mg tablet 1 1 tablet oral taken 1 time per week for 3 [name] starting 11/04/2022 WOUND #1: - Coccyx Wound Laterality: Midline Cleanser: Dakin 16 (oz) 0.25 (Home Health) 1 x Per Day/30 Days Discharge Instructions: Moisten gauze and place on the wound [name], [name] ([id number]) [id number] Page 6 of 7 Cleanser: Normal Saline (Home Health) 1 x Per Day/30 Days Discharge Instructions: Wash your hands with soap and water. Remove old dressing, discard into plastic bag and place into trash. Cleanse the wound with Normal Saline prior to applying a clean dressing using gauze sponges, not tissues or cotton balls. Do not scrub or use excessive force. Pat dry using gauze sponges, not tissue or cotton balls. Prim Dressing: Gauze (Home Health) 1 x Per Day/30 Days ary Discharge Instructions: As directed: dry, moistened with saline or moistened with Dakins Solution Secondary Dressing: ABD Pad 5x9 (in/in) (Home Health) 1 x Per Day/30 Days Discharge Instructions: Cover with ABD pad Secured With: Medipore T - 18M Medipore H Soft Cloth Surgical T ape ape, 2x2 (in/yd) (Home Health) 1 x Per Day/30 Days 1. I would recommend that we have the patient go ahead and initiate the wound VAC with that being said I am going to go ahead as well as send in a prescription for Diflucan for her for just 3 [name] times that she is going to be able to use the nystatin cream I think that the Diflucan would be a good option the reason she can use the cream on this is because we are using the wound VAC at this point. 2. I am good recommend the initiation of the wound VAC but I do believe that  the distal portion of the wound at the area between the wound and the anus she will need to have DuoDERM to help secure and hopefully prevent this from causing any issues with the wound VAC breaking seal which would not be ideal in this case. 3. I do think the fact that she is able to get up and transfer without sliding she pivots and stands and then sits and lays down she is more rolling then slipping which would cause more friction and this does give her a better chance I think getting this completely healed. I do believe that the wound VAC will need to be bridged to 1 hip or the other in order to keep her from sitting on this. We will see patient back for reevaluation in 1 week here in the clinic. If anything worsens or changes patient will contact our office for additional recommendations. Electronic Signature(s) Signed: 11/04/2022 1:32:10 PM By: [name] Derry PA-C Entered By: [name] on 11/04/2022 13:32:09 -------------------------------------------------------------------------------- SuperBill Details Patient Name: Date of Service: [name] Number [name] 11/04/2022 Medical Record Number: [id number] Patient Account Number: [id number] Date of Birth/Sex: Treating RN: 12-06-1940 (82 y.o. [name] Primary Care Provider: Hillard [name] Other Clinician: Referring Provider: Treating Provider/Extender: [name] in Treatment: 2 Diagnosis Coding ICD-10 Codes Code Description [phone] Pressure ulcer of sacral region, stage 4 M62.81 Muscle weakness (generalized) C90.00 Multiple  myeloma not having achieved remission Facility Procedures : 7 CPT4 Code: [id number] Description: 99213 - WOUND CARE VISIT-LEV 3 EST PT Modifier: Quantity: 1 Physician Procedures : CPT4 Code Description Modifier [id number] 99214 - WC PHYS LEVEL 4 - EST PT ICD-10 Diagnosis Description L89.154 Pressure ulcer of sacral region, stage 4 C90.00 Multiple myeloma not having achieved remission M62.81 Muscle  weakness (generalized) Quantity: 1 Electronic Signature(s) [name], [name] ([id number]) [id number] Page 7 of 7 Signed: 11/04/2022 1:32:25 PM By: [name] Derry PA-C Entered By: [name] on 11/04/2022 13:32:24

## 2022-11-05 DIAGNOSIS — I129 Hypertensive chronic kidney disease with stage 1 through stage 4 chronic kidney disease, or unspecified chronic kidney disease: Secondary | ICD-10-CM | POA: Diagnosis not present

## 2022-11-05 DIAGNOSIS — I824Z2 Acute embolism and thrombosis of unspecified deep veins of left distal lower extremity: Secondary | ICD-10-CM | POA: Diagnosis not present

## 2022-11-05 DIAGNOSIS — C7951 Secondary malignant neoplasm of bone: Secondary | ICD-10-CM | POA: Diagnosis not present

## 2022-11-05 DIAGNOSIS — G893 Neoplasm related pain (acute) (chronic): Secondary | ICD-10-CM | POA: Diagnosis not present

## 2022-11-05 DIAGNOSIS — E43 Unspecified severe protein-calorie malnutrition: Secondary | ICD-10-CM | POA: Diagnosis not present

## 2022-11-05 DIAGNOSIS — C9000 Multiple myeloma not having achieved remission: Secondary | ICD-10-CM | POA: Diagnosis not present

## 2022-11-05 DIAGNOSIS — D630 Anemia in neoplastic disease: Secondary | ICD-10-CM | POA: Diagnosis not present

## 2022-11-05 DIAGNOSIS — L89154 Pressure ulcer of sacral region, stage 4: Secondary | ICD-10-CM | POA: Diagnosis not present

## 2022-11-05 DIAGNOSIS — I272 Pulmonary hypertension, unspecified: Secondary | ICD-10-CM | POA: Diagnosis not present

## 2022-11-05 NOTE — Progress Notes (Signed)
[name], [name] ([id number]) [id number] Page 1 of 9 Visit Report for 11/04/2022 Arrival Information Details Patient Name: Date of Service: [name], [name] [name] 11/04/2022 12:15 PM Medical Record Number: [id number] Patient Account Number: [id number] Date of Birth/Sex: Treating RN: 1940/08/03 (82 y.o. [name] Primary Care Malosi Hemstreet: Hillard Danker Other Clinician: Referring Arneshia Ade: Treating Yashica Sterbenz/Extender: Madelynn Done Weeks in Treatment: 2 Visit Information History Since Last Visit Added or deleted any medications: No Patient Arrived: [name] Any new allergies or adverse reactions: No Arrival Time: 12:21 Has Dressing in Place as Prescribed: Yes Accompanied By: son Pain Present Now: No Transfer Assistance: None Patient Identification Verified: Yes Secondary Verification Process Completed: Yes Patient Requires Transmission-Based Precautions: No Patient Has Alerts: Yes Patient Alerts: Not Diabetic Electronic Signature(s) Signed: 11/05/2022 4:41:19 PM By: Midge Aver MSN RN CNS WTA Entered By: Midge Aver on 11/04/2022 12:22:15 -------------------------------------------------------------------------------- Clinic Level of Care Assessment Details Patient Name: Date of Service: [name] 11/04/2022 12:15 PM Medical Record Number: [id number] Patient Account Number: [id number] Date of Birth/Sex: Treating RN: [birth year] (82 y.o. [name] Primary Care Tasheika Kitzmiller: Hillard Danker Other Clinician: Referring Kari Montero: Treating Lattie Cervi/Extender: Madelynn Done Weeks in Treatment: 2 Clinic Level of Care Assessment Items TOOL 4 Quantity Score X- 1 0 Use when only an EandM is performed on FOLLOW-UP visit ASSESSMENTS - Nursing Assessment / Reassessment X- 1 10 Reassessment of Co-morbidities (includes updates in patient status) X- 1 5 Reassessment of Adherence to Treatment Plan ASSESSMENTS - Wound and Skin A ssessment / Reassessment X  - Simple Wound Assessment / Reassessment - one wound 1 5 [name] ([id number]) [id number] Page 2 of 9 []  - 0 Complex Wound Assessment / Reassessment - multiple wounds []  - 0 Dermatologic / Skin Assessment (not related to wound area) ASSESSMENTS - Focused Assessment []  - 0 Circumferential Edema Measurements - multi extremities []  - 0 Nutritional Assessment / Counseling / Intervention []  - 0 Lower Extremity Assessment (monofilament, tuning fork, pulses) []  - 0 Peripheral Arterial Disease Assessment (using hand held doppler) ASSESSMENTS - Ostomy and/or Continence Assessment and Care []  - 0 Incontinence Assessment and Management []  - 0 Ostomy Care Assessment and Management (repouching, etc.) PROCESS - Coordination of Care X - Simple Patient / Family Education for ongoing care 1 15 []  - 0 Complex (extensive) Patient / Family Education for ongoing care X- 1 10 Staff obtains Chiropractor, Records, T Results / Process Orders est []  - 0 Staff telephones HHA, Nursing Homes / Clarify orders / etc []  - 0 Routine Transfer to another Facility (non-emergent condition) []  - 0 Routine Hospital Admission (non-emergent condition) []  - 0 New Admissions / Manufacturing engineer / Ordering NPWT Apligraf, etc. , []  - 0 Emergency Hospital Admission (emergent condition) X- 1 10 Simple Discharge Coordination []  - 0 Complex (extensive) Discharge Coordination PROCESS - Special Needs []  - 0 Pediatric / Minor Patient Management []  - 0 Isolation Patient Management []  - 0 Hearing / Language / Visual special needs []  - 0 Assessment of Community assistance (transportation, D/C planning, etc.) []  - 0 Additional assistance / Altered mentation []  - 0 Support Surface(s) Assessment (bed, cushion, seat, etc.) INTERVENTIONS - Wound Cleansing / Measurement X - Simple Wound Cleansing - one wound 1 5 []  - 0 Complex Wound Cleansing - multiple wounds X- 1 5 Wound Imaging  (photographs - any number of wounds) []  - 0 Wound Tracing (instead of photographs) X- 1 5 Simple Wound Measurement - one wound []  - 0  Complex Wound Measurement - multiple wounds INTERVENTIONS - Wound Dressings []  - 0 Small Wound Dressing one or multiple wounds []  - 0 Medium Wound Dressing one or multiple wounds X- 1 20 Large Wound Dressing one or multiple wounds []  - 0 Application of Medications - topical []  - 0 Application of Medications - injection INTERVENTIONS - Miscellaneous []  - 0 External ear exam []  - 0 Specimen Collection (cultures, biopsies, blood, body fluids, etc.) []  - 0 Specimen(s) / Culture(s) sent or taken to Lab for analysis [name], [name] ([id number]) [id number] Page 3 of 9 []  - 0 Patient Transfer (multiple staff / Nurse, adult / Similar devices) []  - 0 Simple Staple / Suture removal (25 or less) []  - 0 Complex Staple / Suture removal (26 or more) []  - 0 Hypo / Hyperglycemic Management (close monitor of Blood Glucose) []  - 0 Ankle / Brachial Index (ABI) - do not check if billed separately X- 1 5 Vital Signs Has the patient been seen at the hospital within the last three years: Yes Total Score: 95 Level Of Care: New/Established - Level 3 Electronic Signature(s) Signed: 11/05/2022 4:41:19 PM By: Midge Aver MSN RN CNS WTA Entered By: Midge Aver on 11/04/2022 13:01:30 -------------------------------------------------------------------------------- Encounter Discharge Information Details Patient Name: Date of Service: [name] 11/04/2022 12:15 PM Medical Record Number: [id number] Patient Account Number: [id number] Date of Birth/Sex: Treating RN: March 24, 1941 (82 y.o. [name] Primary Care Taj Arteaga: Hillard Danker Other Clinician: Referring Tevon Berhane: Treating Azhar Yogi/Extender: Madelynn Done Weeks in Treatment: 2 Encounter Discharge Information Items Discharge Condition: Stable Ambulatory Status:  Walker Discharge Destination: Home Transportation: Private Auto Accompanied By: son Schedule Follow-up Appointment: Yes Clinical Summary of Care: Electronic Signature(s) Signed: 11/05/2022 4:41:19 PM By: Midge Aver MSN RN CNS WTA Entered By: Midge Aver on 11/04/2022 13:02:43 -------------------------------------------------------------------------------- Lower Extremity Assessment Details Patient Name: Date of Service: HETTY, VOELLER [name] 11/04/2022 12:15 PM Medical Record Number: [id number] Patient Account Number: [id number] Date of Birth/Sex: Treating RN: Oct 27, 1940 (82 y.o. [name] Primary Care Stanislav Gervase: Hillard Danker Other Clinician: Pricilla [name], Collyn [name] ([id number]) [id number] Page 4 of 9 Referring Burdette Forehand: Treating Oswaldo Cueto/Extender: Madelynn Done Weeks in Treatment: 2 Electronic Signature(s) Signed: 11/05/2022 4:41:19 PM By: Midge Aver MSN RN CNS WTA Entered By: Midge Aver on 11/04/2022 12:42:00 -------------------------------------------------------------------------------- Multi Wound Chart Details Patient Name: Date of Service: [name] 11/04/2022 12:15 PM Medical Record Number: [id number] Patient Account Number: [id number] Date of Birth/Sex: Treating RN: [birth year] (82 y.o. [name] Primary Care Jerauld Bostwick: Hillard Danker Other Clinician: Referring Yarielis Funaro: Treating Chanee Henrickson/Extender: Madelynn Done Weeks in Treatment: 2 Vital Signs Height(in): Pulse(bpm): 57 Weight(lbs): Blood Pressure(mmHg): 161/51 Body Mass Index(BMI): Temperature(F): 97.6 Respiratory Rate(breaths/min): 20 [1:Photos:] [N/A:N/A] Midline Coccyx N/A N/A Wound Location: Pressure Injury N/A N/A Wounding Event: Pressure Ulcer N/A N/A Primary Etiology: Hypertension, Received N/A N/A Comorbid History: Chemotherapy 08/30/2022 N/A N/A Date Acquired: 2 N/A N/A Weeks of Treatment: Open N/A N/A Wound Status: No N/A N/A Wound  Recurrence: 7x6x1.7 N/A N/A Measurements L x W x D (cm) 32.987 N/A N/A A (cm) : rea 56.077 N/A N/A Volume (cm) : 24.00% N/A N/A % Reduction in A rea: 48.30% N/A N/A % Reduction in Volume: 8 Position 1 (o'clock): 2 Maximum Distance 1 (cm): Yes N/A N/A Tunneling: Category/Stage IV N/A N/A Classification: Medium N/A N/A Exudate A mount: Serosanguineous N/A N/A Exudate Type: red, brown N/A N/A Exudate Color: Medium (34-66%) N/A N/A Granulation A mount: Red, Pink N/A  N/A Granulation Quality: None Present (0%) N/A N/A Necrotic A mount: Fat Layer (Subcutaneous Tissue): Yes N/A N/A Exposed Structures: Bone: Yes Fascia: No Tendon: No Muscle: No [name], [name] ([id number]) [id number] Page 5 of 9 Joint: No Treatment Notes Electronic Signature(s) Signed: 11/05/2022 4:41:19 PM By: Midge Aver MSN RN CNS WTA Entered By: Midge Aver on 11/04/2022 12:52:46 -------------------------------------------------------------------------------- Multi-Disciplinary Care Plan Details Patient Name: Date of Service: [name], [name] [name] 11/04/2022 12:15 PM Medical Record Number: [id number] Patient Account Number: [id number] Date of Birth/Sex: Treating RN: 11/28/1940 (82 y.o. [name] Primary Care Jacklin Zwick: Hillard Danker Other Clinician: Referring Nelly Scriven: Treating Betzy Barbier/Extender: Madelynn Done Weeks in Treatment: 2 Active Inactive Pressure Nursing Diagnoses: Knowledge deficit related to causes and risk factors for pressure ulcer development Knowledge deficit related to management of pressures ulcers Goals: Patient will remain free from development of additional pressure ulcers Date Initiated: 10/21/2022 Target Resolution Date: 11/21/2022 Goal Status: Active Patient will remain free of pressure ulcers Date Initiated: 10/21/2022 Target Resolution Date: 11/21/2022 Goal Status: Active Patient/caregiver will verbalize risk factors for pressure ulcer  development Date Initiated: 10/21/2022 Target Resolution Date: 11/21/2022 Goal Status: Active Patient/caregiver will verbalize understanding of pressure ulcer management Date Initiated: 10/21/2022 Target Resolution Date: 11/21/2022 Goal Status: Active Interventions: Assess: immobility, friction, shearing, incontinence upon admission and as needed Assess offloading mechanisms upon admission and as needed Assess potential for pressure ulcer upon admission and as needed Provide education on pressure ulcers Notes: Wound/Skin Impairment Nursing Diagnoses: Impaired tissue integrity Knowledge deficit related to ulceration/compromised skin integrity Goals: Patient/caregiver will verbalize understanding of skin care regimen Date Initiated: 10/21/2022 Target Resolution Date: 11/21/2022 Goal Status: Active Ulcer/skin breakdown will have a volume reduction of 30% by week 4 Date Initiated: 10/21/2022 Target Resolution Date: 11/21/2022 [name], [name] ([id number]) [id number] Page 6 of 9 Goal Status: Active Ulcer/skin breakdown will have a volume reduction of 50% by week 8 Date Initiated: 10/21/2022 Target Resolution Date: 12/22/2022 Goal Status: Active Ulcer/skin breakdown will have a volume reduction of 80% by week 12 Date Initiated: 10/21/2022 Target Resolution Date: 01/21/2023 Goal Status: Active Ulcer/skin breakdown will heal within 14 weeks Date Initiated: 10/21/2022 Target Resolution Date: 02/04/2023 Goal Status: Active Interventions: Assess patient/caregiver ability to obtain necessary supplies Assess patient/caregiver ability to perform ulcer/skin care regimen upon admission and as needed Assess ulceration(s) every visit Provide education on ulcer and skin care Treatment Activities: Patient referred to home care : 10/21/2022 Skin care regimen initiated : 10/21/2022 Topical wound management initiated : 10/21/2022 Notes: Electronic Signature(s) Signed: 11/05/2022  4:41:19 PM By: Midge Aver MSN RN CNS WTA Entered By: Midge Aver on 11/04/2022 13:01:47 -------------------------------------------------------------------------------- Pain Assessment Details Patient Name: Date of Service: [name] 11/04/2022 12:15 PM Medical Record Number: [id number] Patient Account Number: [id number] Date of Birth/Sex: Treating RN: [birth year] (82 y.o. [name] Primary Care Todd Argabright: Hillard Danker Other Clinician: Referring Wane Mollett: Treating Asriel Westrup/Extender: Madelynn Done Weeks in Treatment: 2 Active Problems Location of Pain Severity and Description of Pain Patient Has Paino Yes Site Locations Rate the pain. Current Pain Level: 5 Pain Management and Medication [name], [name] ([id number]) [id number] Page 7 of 9 Current Pain Management: Electronic Signature(s) Signed: 11/05/2022 4:41:19 PM By: Midge Aver MSN RN CNS WTA Entered By: Midge Aver on 11/04/2022 12:39:16 -------------------------------------------------------------------------------- Patient/Caregiver Education Details Patient Name: Date of Service: Myra Gianotti 7/29/2024andnbsp12:15 PM Medical Record Number: [id number] Patient Account Number: [id number] Date of Birth/Gender: Treating RN: [birth year] (82 y.o. [name] Primary  Care Physician: Hillard Danker Other Clinician: Referring Physician: Treating Physician/Extender: Madelynn Done Weeks in Treatment: 2 Education Assessment Education Provided To: Patient Education Topics Provided Wound/Skin Impairment: Handouts: Caring for Your Ulcer Methods: Explain/Verbal Responses: State content correctly Electronic Signature(s) Signed: 11/05/2022 4:41:19 PM By: Midge Aver MSN RN CNS WTA Entered By: Midge Aver on 11/04/2022 13:01:59 -------------------------------------------------------------------------------- Wound Assessment Details Patient Name: Date of Service: [name] Number  [name] 11/04/2022 12:15 PM Medical Record Number: [id number] Patient Account Number: [id number] Date of Birth/Sex: Treating RN: 04/21/1940 (82 y.o. [name] Primary Care Jakin Pavao: Hillard Danker Other Clinician: Referring Saleah Rishel: Treating Annibelle Brazie/Extender: Madelynn Done Weeks in Treatment: 2 Wound Status Wound Number: 1 Primary Etiology: Pressure Ulcer Wound Location: Midline Coccyx Wound Status: Open Wounding Event: Pressure Injury Comorbid History: Hypertension, Received Chemotherapy [name], [name] ([id number]) [id number] Page 8 of 9 Date Acquired: 08/30/2022 Weeks Of Treatment: 2 Clustered Wound: No Photos Wound Measurements Length: (cm) 7 Width: (cm) 6 Depth: (cm) 1.7 Area: (cm) 32.987 Volume: (cm) 56.077 % Reduction in Area: 24% % Reduction in Volume: 48.3% Tunneling: Yes Position (o'clock): 8 Maximum Distance: (cm) 2 Undermining: No Wound Description Classification: Category/Stage IV Exudate Amount: Medium Exudate Type: Serosanguineous Exudate Color: red, brown Foul Odor After Cleansing: No Slough/Fibrino No Wound Bed Granulation Amount: Medium (34-66%) Exposed Structure Granulation Quality: Red, Pink Fascia Exposed: No Necrotic Amount: None Present (0%) Fat Layer (Subcutaneous Tissue) Exposed: Yes Tendon Exposed: No Muscle Exposed: No Joint Exposed: No Bone Exposed: Yes Treatment Notes Wound #1 (Coccyx) Wound Laterality: Midline Cleanser Dakin 16 (oz) 0.25 Discharge Instruction: Moisten gauze and place on the wound Normal Saline Discharge Instruction: Wash your hands with soap and water. Remove old dressing, discard into plastic bag and place into trash. Cleanse the wound with Normal Saline prior to applying a clean dressing using gauze sponges, not tissues or cotton balls. Do not scrub or use excessive force. Pat dry using gauze sponges, not tissue or cotton balls. Peri-Wound Care Topical Primary  Dressing Gauze Discharge Instruction: As directed: dry, moistened with saline or moistened with Dakins Solution Secondary Dressing ABD Pad 5x9 (in/in) Discharge Instruction: Cover with ABD pad Secured With Medipore T - 76M Medipore H Soft Cloth Surgical T ape ape, 2x2 (in/yd) Compression Wrap Compression Stockings Add-Ons [name], [name] ([id number]) [id number] Page 9 of 9 Electronic Signature(s) Signed: 11/05/2022 4:41:19 PM By: Midge Aver MSN RN CNS WTA Entered By: Midge Aver on 11/04/2022 12:41:49 -------------------------------------------------------------------------------- Vitals Details Patient Name: Date of Service: [name] 11/04/2022 12:15 PM Medical Record Number: [id number] Patient Account Number: [id number] Date of Birth/Sex: Treating RN: 12-16-1940 (82 y.o. [name] Primary Care Shavelle Runkel: Hillard Danker Other Clinician: Referring Everest Hacking: Treating Joe Gee/Extender: Madelynn Done Weeks in Treatment: 2 Vital Signs Time Taken: 12:29 Temperature (F): 97.6 Pulse (bpm): 57 Respiratory Rate (breaths/min): 20 Blood Pressure (mmHg): 161/51 Reference Range: 80 - 120 mg / dl Electronic Signature(s) Signed: 11/05/2022 4:41:19 PM By: Midge Aver MSN RN CNS WTA Entered By: Midge Aver on 11/04/2022 12:31:04

## 2022-11-06 ENCOUNTER — Other Ambulatory Visit: Payer: Self-pay | Admitting: Physician Assistant

## 2022-11-06 ENCOUNTER — Other Ambulatory Visit: Payer: Self-pay

## 2022-11-06 ENCOUNTER — Inpatient Hospital Stay: Payer: Medicare PPO

## 2022-11-06 ENCOUNTER — Inpatient Hospital Stay: Payer: Medicare PPO | Admitting: Internal Medicine

## 2022-11-06 ENCOUNTER — Ambulatory Visit: Payer: Medicare PPO

## 2022-11-06 DIAGNOSIS — Z7961 Long term (current) use of immunomodulator: Secondary | ICD-10-CM | POA: Diagnosis not present

## 2022-11-06 DIAGNOSIS — Z79624 Long term (current) use of inhibitors of nucleotide synthesis: Secondary | ICD-10-CM | POA: Diagnosis not present

## 2022-11-06 DIAGNOSIS — C9000 Multiple myeloma not having achieved remission: Secondary | ICD-10-CM | POA: Diagnosis not present

## 2022-11-06 DIAGNOSIS — D630 Anemia in neoplastic disease: Secondary | ICD-10-CM | POA: Diagnosis not present

## 2022-11-06 DIAGNOSIS — Z7952 Long term (current) use of systemic steroids: Secondary | ICD-10-CM | POA: Diagnosis not present

## 2022-11-06 DIAGNOSIS — M8580 Other specified disorders of bone density and structure, unspecified site: Secondary | ICD-10-CM | POA: Diagnosis not present

## 2022-11-06 DIAGNOSIS — Z5112 Encounter for antineoplastic immunotherapy: Secondary | ICD-10-CM | POA: Diagnosis not present

## 2022-11-06 DIAGNOSIS — Z79899 Other long term (current) drug therapy: Secondary | ICD-10-CM | POA: Diagnosis not present

## 2022-11-06 LAB — CMP (CANCER CENTER ONLY)
ALT: 40 U/L (ref 0–44)
AST: 17 U/L (ref 15–41)
Albumin: 3.2 g/dL — ABNORMAL LOW (ref 3.5–5.0)
Alkaline Phosphatase: 228 U/L — ABNORMAL HIGH (ref 38–126)
Anion gap: 8 (ref 5–15)
BUN: 28 mg/dL — ABNORMAL HIGH (ref 8–23)
CO2: 28 mmol/L (ref 22–32)
Calcium: 9 mg/dL (ref 8.9–10.3)
Chloride: 105 mmol/L (ref 98–111)
Creatinine: 0.69 mg/dL (ref 0.44–1.00)
GFR, Estimated: >60 mL/min (ref >60)
Glucose, Bld: 123 mg/dL — ABNORMAL HIGH (ref 70–99)
Potassium: 3.8 mmol/L (ref 3.5–5.1)
Sodium: 141 mmol/L (ref 135–145)
Total Bilirubin: 0.3 mg/dL (ref 0.3–1.2)
Total Protein: 5.3 g/dL — ABNORMAL LOW (ref 6.5–8.1)

## 2022-11-06 LAB — SAMPLE TO BLOOD BANK

## 2022-11-06 LAB — CBC WITH DIFFERENTIAL (CANCER CENTER ONLY)
Abs Immature Granulocytes: 0.17 10*3/uL — ABNORMAL HIGH (ref 0.00–0.07)
Basophils Absolute: 0.1 10*3/uL (ref 0.0–0.1)
Basophils Relative: 1 %
Eosinophils Absolute: 0 10*3/uL (ref 0.0–0.5)
Eosinophils Relative: 0 %
HCT: 32.4 % — ABNORMAL LOW (ref 36.0–46.0)
Hemoglobin: 10.6 g/dL — ABNORMAL LOW (ref 12.0–15.0)
Immature Granulocytes: 2 %
Lymphocytes Relative: 34 %
Lymphs Abs: 3.2 10*3/uL (ref 0.7–4.0)
MCH: 31.7 pg (ref 26.0–34.0)
MCHC: 32.7 g/dL (ref 30.0–36.0)
MCV: 97 fL (ref 80.0–100.0)
Monocytes Absolute: 1 10*3/uL (ref 0.1–1.0)
Monocytes Relative: 11 %
Neutro Abs: 5 10*3/uL (ref 1.7–7.7)
Neutrophils Relative %: 52 %
Platelet Count: 261 10*3/uL (ref 150–400)
RBC: 3.34 MIL/uL — ABNORMAL LOW (ref 3.87–5.11)
RDW: 20.6 % — ABNORMAL HIGH (ref 11.5–15.5)
WBC Count: 9.5 10*3/uL (ref 4.0–10.5)
nRBC: 0 % (ref 0.0–0.2)

## 2022-11-06 LAB — LACTATE DEHYDROGENASE: LDH: 231 U/L — ABNORMAL HIGH (ref 98–192)

## 2022-11-06 MED ORDER — DEXAMETHASONE 4 MG PO TABS
ORAL_TABLET | ORAL | 3 refills | Status: DC
Start: 2022-11-06 — End: 2023-02-04

## 2022-11-06 MED ORDER — DIPHENHYDRAMINE HCL 25 MG PO CAPS
50.0000 mg | ORAL_CAPSULE | Freq: Once | ORAL | Status: AC
  Administered 2022-11-06: 50 mg via ORAL
  Filled 2022-11-06: qty 2

## 2022-11-06 MED ORDER — DARATUMUMAB-HYALURONIDASE-FIHJ 1800-30000 MG-UT/15ML ~~LOC~~ SOLN
1800.0000 mg | Freq: Once | SUBCUTANEOUS | Status: AC
  Administered 2022-11-06: 1800 mg via SUBCUTANEOUS
  Filled 2022-11-06: qty 15

## 2022-11-06 MED ORDER — DEXAMETHASONE 4 MG PO TABS
20.0000 mg | ORAL_TABLET | Freq: Once | ORAL | Status: AC
  Administered 2022-11-06: 20 mg via ORAL
  Filled 2022-11-06: qty 5

## 2022-11-06 MED ORDER — ACETAMINOPHEN 325 MG PO TABS
650.0000 mg | ORAL_TABLET | Freq: Once | ORAL | Status: AC
  Administered 2022-11-06: 650 mg via ORAL
  Filled 2022-11-06: qty 2

## 2022-11-06 NOTE — Progress Notes (Signed)
[facility] [phone]   [phone]  OFFICE PROGRESS NOTE  [name], MD 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 Dorris Fetch on January 28, 2022.    CURRENT THERAPY:  1) Starting systemic therapy with Revlimid 21 days on 7 days off, Darzalex, and 20 mg of Decadron weekly.  First dose expected next week on 09/11/2022.  Status post 2 cycles.  2) Monthly Zometa injections for 6 to 12 months, followed by every 3 months thereon after once dental clearance is obtained.  INTERVAL HISTORY: [name] 82 y.o. female returns to the clinic today for follow-up visit accompanied by her son.  The patient continues to complain of the baseline fatigue and weakness.  She has confusions at times.  She has been tolerating her treatment fairly well with no concerning adverse effects.  She denied having any current chest pain, shortness of breath, cough or hemoptysis.  She complains of pain but of unspecified location.  She has no recent weight loss or night sweats.  She is here today for evaluation before starting cycle #3.   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 by echo 10/2021   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/2015   Carotid artery stenosis    < 50% stenosis bilaterally by doppler 07/2016   Coronary artery disease 2008   Coronary Ca score of 331 with  minimal multivessel plaque < 25% stenosis by coronary CTA 8/23   Heart murmur    per pt   Hypercholesteremia    Hypertension    Obesity    Osteopenia    declines treatment   Pneumonia 1995   Pulmonary HTN (HCC)    mild to moderate by echo 7/23 with PASP   Shoulder pain    Due to arthritis   Vitamin D insufficiency     ALLERGIES:  is allergic to crestor [rosuvastatin calcium], lactose, lipitor [atorvastatin], pravastatin, simvastatin, tramadol, zetia [ezetimibe], codeine, penicillins, sulfa antibiotics, and vancomycin.  MEDICATIONS:  Current Outpatient Medications  Medication Sig Dispense Refill   acetaminophen (TYLENOL) 325 MG tablet Take 2 tablets (650 mg total) by mouth every 8 (eight) hours as needed.     acyclovir (ZOVIRAX) 200 MG capsule Take 200 mg by mouth 2 (two) times daily.     allopurinol (ZYLOPRIM) 100 MG tablet Take 1 tablet (100 mg total) by mouth 2 (two) times daily. 60 tablet 2   ascorbic acid (VITAMIN C) 500 MG tablet Take 1 tablet (500 mg total) by mouth daily.     daratumumab-hyaluronidase-fihj (DARZALEX FASPRO) 1800-30000 MG-UT/15ML SOLN Inject 1,800 mg into the skin once.     dexamethasone (DECADRON) 4 MG tablet Please take 5 tablets (20 mg) weekly on the days of treatment 40 tablet 3   gentamicin ointment (GARAMYCIN) 0.1 % Apply 1 Application  topically daily.     H-CHLOR 12 0.125 % SOLN      lenalidomide (REVLIMID) 15 MG capsule Take 1 capsule (15 mg total) by mouth daily. Siri Cole # [id number]     Date Obtained 10/30/2022 Adult female 21 capsule 0   magic mouthwash SOLN Take 5 mLs by mouth 4 (four) times daily. Swish and spit 240 mL 0   Multiple Vitamin (MULTIVITAMIN WITH MINERALS) TABS tablet Take 1 tablet by mouth daily.     multivitamin (RENA-VIT) TABS tablet Take 1 tablet by mouth at bedtime. (Patient not taking: Reported on 10/09/2022)  0   naloxone (NARCAN) nasal spray 4 mg/0.1 mL      pantoprazole (PROTONIX) 40 MG tablet Take 1 tablet (40 mg  total) by mouth 2 (two) times daily. 30 tablet 0   sodium hypochlorite (DAKIN'S 1/4 STRENGTH) 0.125 % SOLN 1 application Externally Once a day for 30 days     No current facility-administered medications for this visit.    SURGICAL HISTORY:  Past Surgical History:  Procedure Laterality Date   AORTIC VALVE REPLACEMENT N/A 10/14/2013   Procedure: AORTIC VALVE REPLACEMENT (AVR);  Surgeon: Alleen Borne, MD;  Location: [facility] OR;  Service: Open Heart Surgery;  Laterality: N/A;   CARDIAC CATHETERIZATION     CATARACT EXTRACTION, BILATERAL     CHOLECYSTECTOMY  04/08/1988   ESOPHAGOGASTRODUODENOSCOPY (EGD) WITH PROPOFOL N/A 09/05/2022   Procedure: ESOPHAGOGASTRODUODENOSCOPY (EGD) WITH PROPOFOL;  Surgeon: Kathi Der, MD;  Location: [facility] ENDOSCOPY;  Service: Gastroenterology;  Laterality: N/A;   INTRAOPERATIVE TRANSESOPHAGEAL ECHOCARDIOGRAM N/A 10/14/2013   Procedure: INTRAOPERATIVE TRANSESOPHAGEAL ECHOCARDIOGRAM;  Surgeon: Alleen Borne, MD;  Location: [facility] OR;  Service: Open Heart Surgery;  Laterality: N/A;   IVC FILTER INSERTION N/A 09/06/2022   Procedure: IVC FILTER INSERTION;  Surgeon: Leonie Douglas, MD;  Location: [facility] INVASIVE CV LAB;  Service: Cardiovascular;  Laterality: N/A;   KYPHOPLASTY Bilateral 03/27/2022   Procedure: KYPHOPLASTY AND BIOPSY THORACIC ELEVEN;  Surgeon: Lisbeth Renshaw, MD;  Location: [facility] OR;  Service: Neurosurgery;  Laterality: Bilateral;   LEFT AND RIGHT HEART CATHETERIZATION WITH CORONARY ANGIOGRAM N/A 09/16/2013   Procedure: LEFT AND RIGHT HEART CATHETERIZATION WITH CORONARY ANGIOGRAM;  Surgeon: Quintella Reichert, MD;  Location: [facility] CATH LAB;  Service: Cardiovascular;  Laterality: N/A;   TONSILLECTOMY      REVIEW OF SYSTEMS:  Constitutional: positive for fatigue Eyes: negative Ears, nose, mouth, throat, and face: negative Respiratory: negative Cardiovascular: negative Gastrointestinal: negative Genitourinary:negative Integument/breast:  negative Hematologic/lymphatic: negative Musculoskeletal:positive for arthralgias and muscle weakness Neurological: positive for memory problems Behavioral/Psych: negative Endocrine: negative Allergic/Immunologic: negative   PHYSICAL EXAMINATION: General appearance: alert, cooperative, fatigued, and no distress Head: Normocephalic, without obvious abnormality, atraumatic Neck: no adenopathy, no JVD, supple, symmetrical, trachea midline, and thyroid not enlarged, symmetric, no tenderness/mass/nodules Lymph nodes: Cervical, supraclavicular, and axillary nodes normal. Resp: clear to auscultation bilaterally Back: symmetric, no curvature. ROM normal. No CVA tenderness. Cardio: regular rate and rhythm, S1, S2 normal, no murmur, click, rub or gallop GI: soft, non-tender; bowel sounds normal; no masses,  no organomegaly Extremities: extremities normal, atraumatic, no cyanosis or edema Neurologic: Alert and oriented X 3, normal strength and tone. Normal symmetric reflexes. Normal coordination and gait  ECOG PERFORMANCE STATUS: 2 - Symptomatic, <50% confined to bed  Blood pressure 136/60, pulse (!) 47, temperature 97.6 F (36.4 C), temperature source Oral, resp. rate 15, height 5\' 1"  (1.549 m), weight 139 lb 14.4 oz (63.5 kg), SpO2 97%.  LABORATORY DATA: Lab Results  Component Value Date   WBC 9.5 11/06/2022   HGB 10.6 (L) 11/06/2022   HCT 32.4 (L) 11/06/2022   MCV 97.0 11/06/2022   PLT 261 11/06/2022      Chemistry      Component Value Date/Time   NA 141 11/06/2022 0937   NA 141 10/06/2020 0939   K 3.8 11/06/2022 0937   CL 105 11/06/2022 0937   CO2 28 11/06/2022 0937   BUN 28 (H) 11/06/2022 0937   BUN 17 10/06/2020 0939   CREATININE 0.69 11/06/2022 0937   CREATININE 0.85 01/29/2016 0910      Component Value Date/Time   CALCIUM 9.0 11/06/2022 0937   ALKPHOS 228 (H) 11/06/2022 0937   AST 17 11/06/2022 0937   ALT 40 11/06/2022 0937   BILITOT 0.3 11/06/2022 0937        RADIOGRAPHIC STUDIES: No results found.  ASSESSMENT AND PLAN: This is a very pleasant 82 years old white female with  1) history of stage I (T1a, N0, M0) thymoma type AB diagnosed in October 2023 status post resection under the care of Dr. Dorris Fetch on January 28, 2022 with close resection margin and microscopic infiltration of the capsule. 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  The patient is undergoing systemic therapy with daratumumab, Revlimid 15 mg p.o. daily for 21 days every 4 weeks in addition to Decadron 20 mg weekly with the treatment.  Status post 2 cycles.   The patient has been tolerating this treatment well with no concerning adverse effects.  She is a little bit more frustrated with her continued fatigue and weakness but definitely she is better than before starting her treatment. I recommended for her to proceed with day 1 of cycle #3 today. She will come back for follow-up visit in 2 weeks for evaluation.  I will repeat myeloma panel with the next lab work For the anemia, she has improvement of her hemoglobin and hematocrit.  There is no need for transfusion today. For the malnutrition, she will continue with the nutritional supplements. The patient was advised to call immediately if she has any other concerning symptoms in the interval. The patient voices understanding of current disease status and treatment options and is in agreement with the current care plan.  All questions were answered. The patient knows to call the clinic with any problems, questions or concerns. We can certainly see the patient much sooner if necessary.  The total time spent in the appointment was 30 minutes.  Disclaimer: This note was dictated with voice recognition software. Similar sounding words can inadvertently be transcribed and may not be corrected upon review.

## 2022-11-06 NOTE — Patient Instructions (Signed)

## 2022-11-07 DIAGNOSIS — L89154 Pressure ulcer of sacral region, stage 4: Secondary | ICD-10-CM | POA: Diagnosis not present

## 2022-11-07 DIAGNOSIS — C7951 Secondary malignant neoplasm of bone: Secondary | ICD-10-CM | POA: Diagnosis not present

## 2022-11-07 DIAGNOSIS — I129 Hypertensive chronic kidney disease with stage 1 through stage 4 chronic kidney disease, or unspecified chronic kidney disease: Secondary | ICD-10-CM | POA: Diagnosis not present

## 2022-11-07 DIAGNOSIS — E43 Unspecified severe protein-calorie malnutrition: Secondary | ICD-10-CM | POA: Diagnosis not present

## 2022-11-07 DIAGNOSIS — D630 Anemia in neoplastic disease: Secondary | ICD-10-CM | POA: Diagnosis not present

## 2022-11-07 DIAGNOSIS — I272 Pulmonary hypertension, unspecified: Secondary | ICD-10-CM | POA: Diagnosis not present

## 2022-11-07 DIAGNOSIS — C9000 Multiple myeloma not having achieved remission: Secondary | ICD-10-CM | POA: Diagnosis not present

## 2022-11-07 DIAGNOSIS — I824Z2 Acute embolism and thrombosis of unspecified deep veins of left distal lower extremity: Secondary | ICD-10-CM | POA: Diagnosis not present

## 2022-11-07 DIAGNOSIS — G893 Neoplasm related pain (acute) (chronic): Secondary | ICD-10-CM | POA: Diagnosis not present

## 2022-11-08 DIAGNOSIS — D630 Anemia in neoplastic disease: Secondary | ICD-10-CM | POA: Diagnosis not present

## 2022-11-08 DIAGNOSIS — C9000 Multiple myeloma not having achieved remission: Secondary | ICD-10-CM | POA: Diagnosis not present

## 2022-11-08 DIAGNOSIS — I129 Hypertensive chronic kidney disease with stage 1 through stage 4 chronic kidney disease, or unspecified chronic kidney disease: Secondary | ICD-10-CM | POA: Diagnosis not present

## 2022-11-08 DIAGNOSIS — G893 Neoplasm related pain (acute) (chronic): Secondary | ICD-10-CM | POA: Diagnosis not present

## 2022-11-08 DIAGNOSIS — L89154 Pressure ulcer of sacral region, stage 4: Secondary | ICD-10-CM | POA: Diagnosis not present

## 2022-11-08 DIAGNOSIS — E43 Unspecified severe protein-calorie malnutrition: Secondary | ICD-10-CM | POA: Diagnosis not present

## 2022-11-08 DIAGNOSIS — C7951 Secondary malignant neoplasm of bone: Secondary | ICD-10-CM | POA: Diagnosis not present

## 2022-11-08 DIAGNOSIS — I824Z2 Acute embolism and thrombosis of unspecified deep veins of left distal lower extremity: Secondary | ICD-10-CM | POA: Diagnosis not present

## 2022-11-08 DIAGNOSIS — I272 Pulmonary hypertension, unspecified: Secondary | ICD-10-CM | POA: Diagnosis not present

## 2022-11-09 DIAGNOSIS — L89154 Pressure ulcer of sacral region, stage 4: Secondary | ICD-10-CM | POA: Diagnosis not present

## 2022-11-09 DIAGNOSIS — E43 Unspecified severe protein-calorie malnutrition: Secondary | ICD-10-CM | POA: Diagnosis not present

## 2022-11-09 DIAGNOSIS — C7951 Secondary malignant neoplasm of bone: Secondary | ICD-10-CM | POA: Diagnosis not present

## 2022-11-09 DIAGNOSIS — C9000 Multiple myeloma not having achieved remission: Secondary | ICD-10-CM | POA: Diagnosis not present

## 2022-11-09 DIAGNOSIS — I272 Pulmonary hypertension, unspecified: Secondary | ICD-10-CM | POA: Diagnosis not present

## 2022-11-09 DIAGNOSIS — D630 Anemia in neoplastic disease: Secondary | ICD-10-CM | POA: Diagnosis not present

## 2022-11-09 DIAGNOSIS — G893 Neoplasm related pain (acute) (chronic): Secondary | ICD-10-CM | POA: Diagnosis not present

## 2022-11-09 DIAGNOSIS — I129 Hypertensive chronic kidney disease with stage 1 through stage 4 chronic kidney disease, or unspecified chronic kidney disease: Secondary | ICD-10-CM | POA: Diagnosis not present

## 2022-11-09 DIAGNOSIS — I824Z2 Acute embolism and thrombosis of unspecified deep veins of left distal lower extremity: Secondary | ICD-10-CM | POA: Diagnosis not present

## 2022-11-12 ENCOUNTER — Telehealth: Payer: Self-pay | Admitting: Medical Oncology

## 2022-11-12 ENCOUNTER — Encounter: Payer: Medicare PPO | Attending: Physician Assistant | Admitting: Physician Assistant

## 2022-11-12 DIAGNOSIS — L89154 Pressure ulcer of sacral region, stage 4: Secondary | ICD-10-CM | POA: Insufficient documentation

## 2022-11-12 DIAGNOSIS — I10 Essential (primary) hypertension: Secondary | ICD-10-CM | POA: Insufficient documentation

## 2022-11-12 DIAGNOSIS — C9000 Multiple myeloma not having achieved remission: Secondary | ICD-10-CM | POA: Diagnosis not present

## 2022-11-12 DIAGNOSIS — Z9221 Personal history of antineoplastic chemotherapy: Secondary | ICD-10-CM | POA: Diagnosis not present

## 2022-11-12 DIAGNOSIS — M6281 Muscle weakness (generalized): Secondary | ICD-10-CM | POA: Diagnosis not present

## 2022-11-12 NOTE — Progress Notes (Addendum)
FAZENBAKER, [name] ([id number]) [id number] Page 1 of 9 Visit Report for 11/12/2022 Arrival Information Details Patient Name: Date of Service: [name], [name] 11/12/2022 1:15 PM Medical Record Number: [id number] Patient Account Number: [id number] Date of Birth/Sex: Treating RN: Mar 03, 1941 (82 y.o. [name] Primary Care : Hillard Danker Other Clinician: Referring : Treating /Extender: Madelynn Done Weeks in Treatment: 3 Visit Information History Since Last Visit Added or deleted any medications: No Patient Arrived: [name] Any new allergies or adverse reactions: No Arrival Time: 13:28 Has Dressing in Place as Prescribed: Yes Accompanied By: son Pain Present Now: No Transfer Assistance: EasyPivot Patient Lift Patient Identification Verified: Yes Secondary Verification Process Completed: Yes Patient Requires Transmission-Based Precautions: No Patient Has Alerts: Yes Patient Alerts: Not Diabetic Electronic Signature(s) Signed: 11/12/2022 3:00:20 PM By: Midge Aver MSN RN CNS WTA Entered By: Midge Aver on 11/12/2022 15:00:20 -------------------------------------------------------------------------------- Clinic Level of Care Assessment Details Patient Name: Date of Service: [name], [name] [name] 11/12/2022 1:15 PM Medical Record Number: [id number] Patient Account Number: [id number] Date of Birth/Sex: Treating RN: November 29, 1940 (82 y.o. [name] Primary Care : Hillard Danker Other Clinician: Referring : Treating /Extender: Madelynn Done Weeks in Treatment: 3 Clinic Level of Care Assessment Items TOOL 4 Quantity Score X- 1 0 Use when only an EandM is performed on FOLLOW-UP visit ASSESSMENTS - Nursing Assessment / Reassessment X- 1 10 Reassessment of Co-morbidities (includes updates in patient status) X- 1 5 Reassessment of Adherence to Treatment Plan ASSESSMENTS - Wound and Skin A ssessment /  Reassessment X - Simple Wound Assessment / Reassessment - one wound 1 5 Kight, [name] ([id number]) [id number] Page 2 of 9 []  - 0 Complex Wound Assessment / Reassessment - multiple wounds []  - 0 Dermatologic / Skin Assessment (not related to wound area) ASSESSMENTS - Focused Assessment []  - 0 Circumferential Edema Measurements - multi extremities []  - 0 Nutritional Assessment / Counseling / Intervention []  - 0 Lower Extremity Assessment (monofilament, tuning fork, pulses) []  - 0 Peripheral Arterial Disease Assessment (using hand held doppler) ASSESSMENTS - Ostomy and/or Continence Assessment and Care []  - 0 Incontinence Assessment and Management []  - 0 Ostomy Care Assessment and Management (repouching, etc.) PROCESS - Coordination of Care X - Simple Patient / Family Education for ongoing care 1 15 []  - 0 Complex (extensive) Patient / Family Education for ongoing care X- 1 10 Staff obtains Chiropractor, Records, T Results / Process Orders est []  - 0 Staff telephones HHA, Nursing Homes / Clarify orders / etc []  - 0 Routine Transfer to another Facility (non-emergent condition) []  - 0 Routine Hospital Admission (non-emergent condition) []  - 0 New Admissions / Manufacturing engineer / Ordering NPWT Apligraf, etc. , []  - 0 Emergency Hospital Admission (emergent condition) X- 1 10 Simple Discharge Coordination []  - 0 Complex (extensive) Discharge Coordination PROCESS - Special Needs []  - 0 Pediatric / Minor Patient Management []  - 0 Isolation Patient Management []  - 0 Hearing / Language / Visual special needs []  - 0 Assessment of Community assistance (transportation, D/C planning, etc.) []  - 0 Additional assistance / Altered mentation []  - 0 Support Surface(s) Assessment (bed, cushion, seat, etc.) INTERVENTIONS - Wound Cleansing / Measurement X - Simple Wound Cleansing - one wound 1 5 []  - 0 Complex Wound Cleansing - multiple wounds X- 1  5 Wound Imaging (photographs - any number of wounds) []  - 0 Wound Tracing (instead of photographs) X- 1 5 Simple Wound Measurement - one wound []  -  0 Complex Wound Measurement - multiple wounds INTERVENTIONS - Wound Dressings []  - 0 Small Wound Dressing one or multiple wounds X- 1 15 Medium Wound Dressing one or multiple wounds []  - 0 Large Wound Dressing one or multiple wounds []  - 0 Application of Medications - topical []  - 0 Application of Medications - injection INTERVENTIONS - Miscellaneous []  - 0 External ear exam []  - 0 Specimen Collection (cultures, biopsies, blood, body fluids, etc.) []  - 0 Specimen(s) / Culture(s) sent or taken to Lab for analysis Moulder, [name] ([id number]) [id number] Page 3 of 9 []  - 0 Patient Transfer (multiple staff / Nurse, adult / Similar devices) []  - 0 Simple Staple / Suture removal (25 or less) []  - 0 Complex Staple / Suture removal (26 or more) []  - 0 Hypo / Hyperglycemic Management (close monitor of Blood Glucose) []  - 0 Ankle / Brachial Index (ABI) - do not check if billed separately X- 1 5 Vital Signs Has the patient been seen at the hospital within the last three years: Yes Total Score: 90 Level Of Care: New/Established - Level 3 Electronic Signature(s) Signed: 11/12/2022 4:55:07 PM By: Midge Aver MSN RN CNS WTA Entered By: Midge Aver on 11/12/2022 15:02:10 -------------------------------------------------------------------------------- Encounter Discharge Information Details Patient Name: Date of Service: [name] 11/12/2022 1:15 PM Medical Record Number: [id number] Patient Account Number: [id number] Date of Birth/Sex: Treating RN: [birth year] (82 y.o. [name] Primary Care : Hillard Danker Other Clinician: Referring : Treating /Extender: Madelynn Done Weeks in Treatment: 3 Encounter Discharge Information Items Discharge Condition: Stable Ambulatory  Status: Walker Discharge Destination: Home Transportation: Private Auto Accompanied By: son Schedule Follow-up Appointment: Yes Clinical Summary of Care: Electronic Signature(s) Signed: 11/12/2022 3:50:41 PM By: Midge Aver MSN RN CNS WTA Previous Signature: 11/12/2022 3:03:34 PM Version By: Midge Aver MSN RN CNS WTA Entered By: Midge Aver on 11/12/2022 15:50:41 -------------------------------------------------------------------------------- Lower Extremity Assessment Details Patient Name: Date of Service: [name], [name] [name] 11/12/2022 1:15 PM Medical Record Number: [id number] Patient Account Number: [id number] Date of Birth/Sex: Treating RN: 1940-06-26 (82 y.o. [name], [name] ([id number]) [id number] Page 4 of 9 Primary Care : Hillard Danker Other Clinician: Referring : Treating /Extender: Madelynn Done Weeks in Treatment: 3 Electronic Signature(s) Signed: 11/12/2022 3:00:49 PM By: Midge Aver MSN RN CNS WTA Entered By: Midge Aver on 11/12/2022 15:00:48 -------------------------------------------------------------------------------- Multi Wound Chart Details Patient Name: Date of Service: [name], [name] [name] 11/12/2022 1:15 PM Medical Record Number: [id number] Patient Account Number: [id number] Date of Birth/Sex: Treating RN: [birth year] (82 y.o. [name] Primary Care : Hillard Danker Other Clinician: Referring : Treating /Extender: Madelynn Done Weeks in Treatment: 3 Vital Signs Height(in): Pulse(bpm): 48 Weight(lbs): Blood Pressure(mmHg): 137/102 Body Mass Index(BMI): Temperature(F): 97.5 Respiratory Rate(breaths/min): 20 [1:Photos:] [N/A:N/A] Midline Coccyx N/A N/A Wound Location: Pressure Injury N/A N/A Wounding Event: Pressure Ulcer N/A N/A Primary Etiology: Hypertension, Received N/A N/A Comorbid History: Chemotherapy 08/30/2022 N/A N/A Date Acquired: 3 N/A  N/A Weeks of Treatment: Open N/A N/A Wound Status: No N/A N/A Wound Recurrence: 9x4x2 N/A N/A Measurements L x W x D (cm) 28.274 N/A N/A A (cm) : rea 56.549 N/A N/A Volume (cm) : 34.80% N/A N/A % Reduction in A rea: 47.90% N/A N/A % 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 N/A N/A Classification: Medium N/A N/A Exudate A mount: Serosanguineous N/A N/A Exudate Type: red, brown N/A N/A Exudate Color: Medium (34-66%) N/A N/A Granulation A mount: Red, Pink  N/A N/A Granulation Quality: None Present (0%) N/A N/A Necrotic A mount: Fat Layer (Subcutaneous Tissue): Yes N/A N/A Exposed Structures: Bone: Yes Fascia: No Tendon: No Muscle: No Joint: No Croswell, Janay [name] ([id number]) [id number] Page 5 of 9 Treatment Notes Electronic Signature(s) Signed: 11/12/2022 3:00:56 PM By: Midge Aver MSN RN CNS WTA Entered By: Midge Aver on 11/12/2022 15:00:56 -------------------------------------------------------------------------------- Multi-Disciplinary Care Plan Details Patient Name: Date of Service: [name], [name] [name] 11/12/2022 1:15 PM Medical Record Number: [id number] Patient Account Number: [id number] Date of Birth/Sex: Treating RN: 24-Dec-1940 (82 y.o. [name] Primary Care : Hillard Danker Other Clinician: Referring : Treating /Extender: Madelynn Done Weeks in Treatment: 3 Active Inactive Pressure Nursing Diagnoses: Knowledge deficit related to causes and risk factors for pressure ulcer development Knowledge deficit related to management of pressures ulcers Goals: Patient will remain free from development of additional pressure ulcers Date Initiated: 10/21/2022 Target Resolution Date: 11/21/2022 Goal Status: Active Patient will remain free of pressure ulcers Date Initiated: 10/21/2022 Target Resolution Date: 11/21/2022 Goal Status: Active Patient/caregiver will verbalize risk factors for pressure ulcer  development Date Initiated: 10/21/2022 Target Resolution Date: 11/21/2022 Goal Status: Active Patient/caregiver will verbalize understanding of pressure ulcer management Date Initiated: 10/21/2022 Target Resolution Date: 11/21/2022 Goal Status: Active Interventions: Assess: immobility, friction, shearing, incontinence upon admission and as needed Assess offloading mechanisms upon admission and as needed Assess potential for pressure ulcer upon admission and as needed Provide education on pressure ulcers Notes: Wound/Skin Impairment Nursing Diagnoses: Impaired tissue integrity Knowledge deficit related to ulceration/compromised skin integrity Goals: Patient/caregiver will verbalize understanding of skin care regimen Date Initiated: 10/21/2022 Target Resolution Date: 11/21/2022 Goal Status: Active Ulcer/skin breakdown will have a volume reduction of 30% by week 4 Date Initiated: 10/21/2022 Target Resolution Date: 11/21/2022 Goal Status: Active Ulcer/skin breakdown will have a volume reduction of 50% by week 8 Hord, Dori [name] ([id number]) [id number] Page 6 of 9 Date Initiated: 10/21/2022 Target Resolution Date: 12/22/2022 Goal Status: Active Ulcer/skin breakdown will have a volume reduction of 80% by week 12 Date Initiated: 10/21/2022 Target Resolution Date: 01/21/2023 Goal Status: Active Ulcer/skin breakdown will heal within 14 weeks Date Initiated: 10/21/2022 Target Resolution Date: 02/04/2023 Goal Status: Active Interventions: Assess patient/caregiver ability to obtain necessary supplies Assess patient/caregiver ability to perform ulcer/skin care regimen upon admission and as needed Assess ulceration(s) every visit Provide education on ulcer and skin care Treatment Activities: Patient referred to home care : 10/21/2022 Skin care regimen initiated : 10/21/2022 Topical wound management initiated : 10/21/2022 Notes: Electronic Signature(s) Signed: 11/12/2022  3:02:30 PM By: Midge Aver MSN RN CNS WTA Entered By: Midge Aver on 11/12/2022 15:02:30 -------------------------------------------------------------------------------- Pain Assessment Details Patient Name: Date of Service: [name], [name] [name] 11/12/2022 1:15 PM Medical Record Number: [id number] Patient Account Number: [id number] Date of Birth/Sex: Treating RN: 02-03-1941 (82 y.o. [name] Primary Care : Hillard Danker Other Clinician: Referring : Treating /Extender: Madelynn Done Weeks in Treatment: 3 Active Problems Location of Pain Severity and Description of Pain Patient Has Paino Yes Site Locations Rate the pain. Current Pain Level: 8 Pain Management and Medication Current Pain Management: Jochebed, Inocencio Sunni [name] ([id number]) [id number] Page 7 of 9 Electronic Signature(s) Signed: 11/12/2022 4:55:07 PM By: Midge Aver MSN RN CNS WTA Entered By: Midge Aver on 11/12/2022 15:00:36 -------------------------------------------------------------------------------- Patient/Caregiver Education Details Patient Name: Date of Service: [name] 8/6/2024andnbsp1:15 PM Medical Record Number: [id number] Patient Account Number: [id number] Date of Birth/Gender: Treating RN: Apr 22, 1940 (82 y.o. [name]  Primary Care Physician: Hillard Danker Other Clinician: Referring Physician: Treating Physician/Extender: Madelynn Done Weeks in Treatment: 3 Education Assessment Education Provided To: Patient and Caregiver Education Topics Provided Wound/Skin Impairment: Handouts: Caring for Your Ulcer Methods: Explain/Verbal Responses: State content correctly Electronic Signature(s) Signed: 11/12/2022 4:55:07 PM By: Midge Aver MSN RN CNS WTA Entered By: Midge Aver on 11/12/2022 15:02:45 -------------------------------------------------------------------------------- Wound Assessment Details Patient Name: Date of  Service: [name], [name] [name] 11/12/2022 1:15 PM Medical Record Number: [id number] Patient Account Number: [id number] Date of Birth/Sex: Treating RN: June 24, 1940 (82 y.o. [name] Primary Care : Hillard Danker Other Clinician: Referring : Treating /Extender: Madelynn Done Weeks in Treatment: 3 Wound Status Wound Number: 1 Primary Etiology: Pressure Ulcer Wound Location: Midline Coccyx Wound Status: Open Wounding Event: Pressure Injury Comorbid History: Hypertension, Received Chemotherapy Date Acquired: 08/30/2022 Weeks Of Treatment: 3 Ralls, Carmisha [name] ([id number]) [id number] Page 8 of 9 Clustered Wound: No Photos Wound Measurements Length: (cm) 9 Width: (cm) 4 Depth: (cm) 2 Area: (cm) 28.274 Volume: (cm) 56.549 % Reduction in Area: 34.8% % Reduction in Volume: 47.9% Wound Description Classification: Category/Stage IV Exudate Amount: Medium Exudate Type: Serosanguineous Exudate Color: red, brown Foul Odor After Cleansing: No Slough/Fibrino No Wound Bed Granulation Amount: Medium (34-66%) Exposed Structure Granulation Quality: Red, Pink Fascia Exposed: No Necrotic Amount: None Present (0%) Fat Layer (Subcutaneous Tissue) Exposed: Yes Tendon Exposed: No Muscle Exposed: No Joint Exposed: No Bone Exposed: Yes Treatment Notes Wound #1 (Coccyx) Wound Laterality: Midline Cleanser Dakin 16 (oz) 0.25 Discharge Instruction: Moisten gauze and place on the wound Normal Saline Discharge Instruction: Wash your hands with soap and water. Remove old dressing, discard into plastic bag and place into trash. Cleanse the wound with Normal Saline prior to applying a clean dressing using gauze sponges, not tissues or cotton balls. Do not scrub or use excessive force. Pat dry using gauze sponges, not tissue or cotton balls. Peri-Wound Care Topical Primary Dressing Gauze Discharge Instruction: As directed: dry, moistened with saline  or moistened with Dakins Solution Secondary Dressing ABD Pad 5x9 (in/in) Discharge Instruction: Cover with ABD pad Secured With Medipore T - 14M Medipore H Soft Cloth Surgical T ape ape, 2x2 (in/yd) Compression Wrap Compression Stockings Add-Ons Electronic Signature(s) Signed: 11/12/2022 4:55:07 PM By: Midge Aver MSN RN CNS WTA Entered By: Midge Aver on 11/12/2022 13:44:23 Schiro, Nyajah [name] ([id number]) [id number] Page 9 of 9 -------------------------------------------------------------------------------- Vitals Details Patient Name: Date of Service: LAVANA, AMEND 11/12/2022 1:15 PM Medical Record Number: [id number] Patient Account Number: [id number] Date of Birth/Sex: Treating RN: Feb 12, 1941 (82 y.o. [name] Primary Care : Hillard Danker Other Clinician: Referring : Treating /Extender: Madelynn Done Weeks in Treatment: 3 Vital Signs Time Taken: 13:31 Temperature (F): 97.5 Pulse (bpm): 48 Respiratory Rate (breaths/min): 20 Blood Pressure (mmHg): 137/102 Reference Range: 80 - 120 mg / dl Electronic Signature(s) Signed: 11/12/2022 3:00:29 PM By: Midge Aver MSN RN CNS WTA Entered By: Midge Aver on 11/12/2022 15:00:29

## 2022-11-12 NOTE — Telephone Encounter (Signed)
I lvm that pt should continue to take Dexamethasone weekly .  She is to take 5 tablets ( 4 mg each) for a total of 20 mg weekly. I told him that [facility] did not prescribe 2 mg tablets.  I asked  him to return my call.

## 2022-11-12 NOTE — Progress Notes (Signed)
[name] ([id number]) [id number] Page 1 of 6 Visit Report for 11/12/2022 Chief Complaint Document Details Patient Name: Date of Service: [name], [name] 11/12/2022 1:15 PM Medical Record Number: [id number] Patient Account Number: [id number] Date of Birth/Sex: Treating RN: 08-Jan-1941 (82 y.o. [name] Primary Care Provider: Hillard Danker Other Clinician: Referring Provider: Treating Provider/Extender: Madelynn Done Weeks in Treatment: 3 Information Obtained from: Patient Chief Complaint Stage 4 pressure ulcer Electronic Signature(s) Signed: 11/12/2022 1:26:55 PM By: Allen Derry PA-C Entered By: Allen Derry on 11/12/2022 13:26:54 -------------------------------------------------------------------------------- HPI Details Patient Name: Date of Service: [name], [name] [name] 11/12/2022 1:15 PM Medical Record Number: [id number] Patient Account Number: [id number] Date of Birth/Sex: Treating RN: [birth year] (82 y.o. 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 Currently there is not direct bone exposure because she is now covered with granulation tissue her family has been taking care of her at home at this point although she was previously in a facility. This is something that was noted when she ended up having to go to the ER initially on Aug 30, 2022. Subsequently during hospital stent it was started that she had gentamicin applied topically along with Dakin's moistened gauze packing which has been done at home now and seems to be doing very well. She was at [facility] skilled nursing facility  for time. She did have Levaquin previous which she did do very well for her as far as get the infection under control in my opinion I think she is actually seeming to do quite well at this point. She is not having any pain which is also excellent news at this time. She is starting to get around more so she is not completely bedbound although she is a little weak she does have family that is very attentive and taking great care of her. 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 There is an area of bone that is very thinly covered. Been using Dakin's wet-to-dry dressings. She has in-home caregivers 24/7 and also home health coming out to change dressings/participate in dressing supervision although most of her dressing changes are being done by her own in-home caregivers. 11-04-2022 upon evaluation today patient appears to be doing well currently in regard to her wound. With that being said I do feel like she is making really good progress towards healing currently. I do not see any evidence of active infection locally nor systemically which is excellent news as well. No fevers, chills, Ide, Lya [name] ([id number]) [id number] Page 2 of 6 nausea, vomiting, or diarrhea. 11-12-2022 patient's wound today appears to be doing okay although to be honest I am a little concerned about the fact that the wound VAC was not put on properly. It was not bridged she was laying on the tubing and this course was not very comfortable for her. Fortunately I do not see any signs of active infection locally or systemically which is great news. No fevers, chills, nausea, vomiting, or diarrhea. Electronic Signature(s) Signed: 11/12/2022 2:27:56 PM By: Allen Derry PA-C  Entered By: Allen Derry on 11/12/2022  14:27:56 -------------------------------------------------------------------------------- Physical Exam Details Patient Name: Date of Service: [name], [name] 11/12/2022 1:15 PM Medical Record Number: [id number] Patient Account Number: [id number] Date of Birth/Sex: Treating RN: December 12, 1940 (82 y.o. [name] Primary Care Provider: Hillard Danker Other Clinician: Referring Provider: Treating Provider/Extender: Madelynn Done Weeks in Treatment: 3 Constitutional Well-nourished and well-hydrated in no acute distress. Respiratory normal breathing without difficulty. Psychiatric this patient is able to make decisions and demonstrates good insight into disease process. Alert and Oriented x 3. pleasant and cooperative. Notes Upon inspection patient's wound bed actually showed signs of good granulation and epithelization at this point. Fortunately I do not see any signs of infection at this time which is great news and in general I do believe that will make headway towards complete closure. Electronic Signature(s) Signed: 11/12/2022 2:28:32 PM By: Allen Derry PA-C Previous Signature: 11/12/2022 2:28:12 PM Version By: Allen Derry PA-C Entered By: Allen Derry on 11/12/2022 14:28:31 -------------------------------------------------------------------------------- Physician Orders Details Patient Name: Date of Service: [name], [name] [name] 11/12/2022 1:15 PM Medical Record Number: [id number] Patient Account Number: [id number] Date of Birth/Sex: Treating RN: Oct 30, 1940 (82 y.o. [name] Primary Care Provider: Hillard Danker Other Clinician: Referring Provider: Treating Provider/Extender: Madelynn Done Weeks in Treatment: 3 Verbal / Phone Orders: [name], [name] ([id number]) [id number] Page 3 of 6 Diagnosis Coding ICD-10 Coding Code Description L89.154 Pressure ulcer of sacral region, stage 4 M62.81 Muscle weakness (generalized) C90.00 Multiple  myeloma not having achieved remission Follow-up Appointments Return Appointment in 1 week. [facility] for wound care. May utilize formulary equivalent dressing for wound treatment orders unless otherwise specified. Home Health Nurse may visit PRN to address patients wound care needs. - [facility]--Remove dressing and clean the wound with normal saline, use skin barrier wipes prior to apply the drape. Apply wound vac foam and use the Duoderm (may cut in a U shape). Please bridge the vac from the wound up to the hip to prevent lying on the tubing. Must use foam over the drape to bridge. Please see photo sent with patient and link for video. [email or web address] Change MWF. Patient will come to wound center on Monday. We will remove vac and HH RN will need to come in the afternoon to replace the vac. Please call if you have any questions. Patient must be lying in bed. Bathing/ Shower/ Hygiene May shower; gently cleanse wound with antibacterial soap, rinse and pat dry prior to dressing wounds Off-Loading Low air-loss mattress (Group 2) Turn and reposition every 2 hours Negative Pressure Wound Therapy Other: - NPWT to be applied on Monday 11/04/22 by [facility] nurse Wound Treatment Wound #1 - Coccyx Wound Laterality: Midline Cleanser: Dakin 16 (oz) 0.25 (Home Health) 1 x Per Day/30 Days Discharge Instructions: Moisten gauze and place on the wound Cleanser: Normal Saline (Home Health) 1 x Per Day/30 Days Discharge Instructions: Wash your hands with soap and water. Remove old dressing, discard into plastic bag and place into trash. Cleanse the wound with Normal Saline prior to applying a clean dressing using gauze sponges, not tissues or cotton balls. Do not scrub or use excessive force. Pat dry using gauze sponges, not tissue or cotton balls. Prim Dressing: Gauze (Home Health) 1 x Per Day/30 Days ary Discharge Instructions: As directed: dry, moistened  with saline or moistened with Dakins Solution Secondary Dressing: ABD Pad 5x9 (in/in) (Home Health) 1 x Per Day/30 Days Discharge Instructions: Cover with  ABD pad Secured With: Medipore T - 27M Medipore H Soft Cloth Surgical T ape ape, 2x2 (in/yd) (Home Health) 1 x Per Day/30 Days Electronic Signature(s) Unsigned Entered By: Midge Aver on 11/12/2022 14:16:22 -------------------------------------------------------------------------------- Problem List Details Patient Name: Date of Service: [name], [name] 11/12/2022 1:15 PM Medical Record Number: [id number] Patient Account Number: [id number] Date of Birth/Sex: Treating RN: 06-Jul-1940 (82 y.o. [name] Primary Care Provider: Hillard Danker Other Clinician: Referring Provider: Treating Provider/Extender: Madelynn Done Weeks in Treatment: 3 Carboni, [name] ([id number]) [id number] Page 4 of 6 Active Problems ICD-10 Encounter Code Description Active Date MDM Diagnosis L89.154 Pressure ulcer of sacral region, stage 4 10/21/2022 No Yes M62.81 Muscle weakness (generalized) 10/21/2022 No Yes C90.00 Multiple myeloma not having achieved remission 10/21/2022 No Yes Inactive Problems Resolved Problems Electronic Signature(s) Signed: 11/12/2022 1:26:48 PM By: Allen Derry PA-C Entered By: Allen Derry on 11/12/2022 13:26:48 -------------------------------------------------------------------------------- Progress Note Details Patient Name: Date of Service: [name], [name] [name] 11/12/2022 1:15 PM Medical Record Number: [id number] Patient Account Number: [id number] Date of Birth/Sex: Treating RN: 07-15-1940 (82 y.o. 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 Currently there is not direct bone exposure because she is now covered with granulation tissue her family has been taking care of her at home at this point although she was previously in a facility. This is something that was noted when she ended up having to go to the ER initially on Aug 30, 2022. Subsequently during hospital stent it was started that she had gentamicin applied topically along with Dakin's moistened gauze packing which has been done at home now and seems to be doing very well. She was at [facility] skilled nursing facility for time. She did have Levaquin previous which she did do very well for her as far as get the infection under control in my opinion I think she is actually seeming to do quite well at this point. She is not having any pain which is also excellent news at this time. She is starting to get around more so she is not completely bedbound although she is a little weak she does have family that is very attentive and taking great care of her. 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 There is an area of bone that is very thinly covered. Been using Dakin's wet-to-dry dressings. She has in-home caregivers 24/7 and also home health coming out to change dressings/participate in dressing supervision although most of her dressing changes are being done by her own in-home caregivers. 11-04-2022 upon evaluation today patient appears to be doing well currently in regard to her wound. With that being said I do feel like she is making really good progress towards healing currently. I do not see any evidence of active  infection locally nor systemically which is excellent news as well. No fevers, chills, nausea, vomiting, or diarrhea. 11-12-2022 patient's wound today appears to be doing okay although to be honest I am a little  concerned about the fact that the wound VAC was not put on [name], [name] ([id number]) [id number] Page 5 of 6 properly. It was not bridged she was laying on the tubing and this course was not very comfortable for her. Fortunately I do not see any signs of active infection locally or systemically which is great news. No fevers, chills, nausea, vomiting, or diarrhea. Objective Constitutional Well-nourished and well-hydrated in no acute distress. Vitals Time Taken: 1:31 PM, Temperature: 97.5 F, Pulse: 48 bpm, Respiratory Rate: 20 breaths/min, Blood Pressure: 137/102 mmHg. Respiratory normal breathing without difficulty. Psychiatric this patient is able to make decisions and demonstrates good insight into disease process. Alert and Oriented x 3. pleasant and cooperative. General Notes: Upon inspection patient's wound bed actually showed signs of good granulation and epithelization at this point. Fortunately I do not see any signs of infection at this time which is great news and in general I do believe that will make headway towards complete closure. Integumentary (Hair, Skin) Wound #1 status is Open. Original cause of wound was Pressure Injury. The date acquired was: 08/30/2022. The wound has been in treatment 3 weeks. The wound is located on the Midline Coccyx. The wound measures 9cm length x 4cm width x 2cm depth; 28.274cm^2 area and 56.549cm^3 volume. There is bone and Fat Layer (Subcutaneous Tissue) exposed. There is a medium amount of serosanguineous drainage noted. There is medium (34-66%) red, pink granulation within the wound bed. There is no necrotic tissue within the wound bed. Assessment Active Problems ICD-10 Pressure ulcer of sacral region, stage  4 Muscle weakness (generalized) Multiple myeloma not having achieved remission Plan Follow-up Appointments: Return Appointment in 1 week. Home Health: [facility] for wound care. May utilize formulary equivalent dressing for wound treatment orders unless otherwise specified. Home Health Nurse may visit PRN to address patients wound care needs. - [facility]--Remove dressing and clean the wound with normal saline, use skin barrier wipes prior to apply the drape. Apply wound vac foam and use the Duoderm (may cut in a U shape). Please bridge the vac from the wound up to the hip to prevent lying on the tubing. Must use foam over the drape to bridge. Please see photo sent with patient and link for video. [email or web address] Change MWF. Patient will come to wound center on Monday. We will remove vac and HH RN will need to come in the afternoon to replace the vac. Please call if you have any questions. Patient must be lying in bed. Bathing/ Shower/ Hygiene: May shower; gently cleanse wound with antibacterial soap, rinse and pat dry prior to dressing wounds Off-Loading: Low air-loss mattress (Group 2) Turn and reposition every 2 hours Negative Pressure Wound Therapy: Other: - NPWT to be applied on Monday 11/04/22 by [facility] nurse WOUND #1: - Coccyx Wound Laterality: Midline Cleanser: Dakin 16 (oz) 0.25 (Home Health) 1 x Per Day/30 Days Discharge Instructions: Moisten gauze and place on the wound Cleanser: Normal Saline (Home Health) 1 x Per Day/30 Days Discharge Instructions: Wash your hands with soap and water. Remove old dressing, discard into plastic bag and place into trash. Cleanse the wound with Normal Saline prior to applying a clean dressing using gauze sponges, not tissues or cotton balls. Do not scrub or use excessive force. Pat dry using gauze sponges, not tissue or cotton balls. Prim Dressing: Gauze (Home Health) 1 x Per Day/30 Days ary Discharge  Instructions: As directed: dry, moistened with saline or moistened with Dakins Solution Secondary Dressing: ABD Pad  5x9 (in/in) (Home Health) 1 x Per Day/30 Days Discharge Instructions: Cover with ABD pad Secured With: Medipore T - 60M Medipore H Soft Cloth Surgical T ape ape, 2x2 (in/yd) (Home Health) 1 x Per Day/30 Days 1. I am going to recommend that we had the patient continue with the wound VAC although I think it needs to be applied appropriately for use of Dakin's wet to dry dressing today. EMPEY, [name] ([id number]) [id number] Page 6 of 6 2. I am going to recommend as well that patient should continue to monitor for any signs of infection or worsening. Overall I do believe that she is making good progress towards getting this healed but at the same time that the wound VAC can be beneficial it has to be applied appropriately. Right now and as of today it was not at least not the one that I saw. It was not draped properly to protect the skin it was not bridged properly instead of have the tubing just taped down to her back off to the side which was not the appropriate way to go she was laying on this and she was not comfortable. Nonetheless I did print off a paper as well as marked right's very specific instructions on the paper and then subsequently I am also going to contact the clinical nurse manager with [facility] to discuss this as well to try to ensure that is done properly. Both the bridging and the overall draping of the skin in order to protect the skin from being irritated needs some work based on what I saw. We will see patient back for reevaluation in 1 week here in the clinic. If anything worsens or changes patient will contact our office for additional recommendations. Electronic Signature(s) Signed: 11/12/2022 2:29:43 PM By: Allen Derry PA-C Entered By: Allen Derry on 11/12/2022  14:29:43 -------------------------------------------------------------------------------- SuperBill Details Patient Name: Date of Service: Rich Number [name] 11/12/2022 Medical Record Number: [id number] Patient Account Number: [id number] Date of Birth/Sex: Treating RN: 1940-04-22 (82 y.o. [name] Primary Care Provider: Hillard Danker Other Clinician: Referring Provider: Treating Provider/Extender: Madelynn Done Weeks in Treatment: 3 Diagnosis Coding ICD-10 Codes Code Description [phone] Pressure ulcer of sacral region, stage 4 M62.81 Muscle weakness (generalized) C90.00 Multiple myeloma not having achieved remission Physician Procedures : CPT4 Code Description Modifier [id number] - WC PHYS LEVEL 4 - EST PT ICD-10 Diagnosis Description L89.154 Pressure ulcer of sacral region, stage 4 M62.81 Muscle weakness (generalized) C90.00 Multiple myeloma not having achieved remission Quantity: 1 Electronic Signature(s) Signed: 11/12/2022 2:29:56 PM By: Allen Derry PA-C Entered By: Allen Derry on 11/12/2022 14:29:56

## 2022-11-13 ENCOUNTER — Telehealth: Payer: Self-pay | Admitting: Medical Oncology

## 2022-11-13 ENCOUNTER — Other Ambulatory Visit: Payer: Self-pay | Admitting: Medical Oncology

## 2022-11-13 DIAGNOSIS — G893 Neoplasm related pain (acute) (chronic): Secondary | ICD-10-CM | POA: Diagnosis not present

## 2022-11-13 DIAGNOSIS — D630 Anemia in neoplastic disease: Secondary | ICD-10-CM | POA: Diagnosis not present

## 2022-11-13 DIAGNOSIS — I272 Pulmonary hypertension, unspecified: Secondary | ICD-10-CM | POA: Diagnosis not present

## 2022-11-13 DIAGNOSIS — I824Z2 Acute embolism and thrombosis of unspecified deep veins of left distal lower extremity: Secondary | ICD-10-CM | POA: Diagnosis not present

## 2022-11-13 DIAGNOSIS — I129 Hypertensive chronic kidney disease with stage 1 through stage 4 chronic kidney disease, or unspecified chronic kidney disease: Secondary | ICD-10-CM | POA: Diagnosis not present

## 2022-11-13 DIAGNOSIS — C7951 Secondary malignant neoplasm of bone: Secondary | ICD-10-CM | POA: Diagnosis not present

## 2022-11-13 DIAGNOSIS — C9000 Multiple myeloma not having achieved remission: Secondary | ICD-10-CM | POA: Diagnosis not present

## 2022-11-13 DIAGNOSIS — E43 Unspecified severe protein-calorie malnutrition: Secondary | ICD-10-CM | POA: Diagnosis not present

## 2022-11-13 DIAGNOSIS — L89154 Pressure ulcer of sacral region, stage 4: Secondary | ICD-10-CM | POA: Diagnosis not present

## 2022-11-13 NOTE — Telephone Encounter (Signed)
Dexamethasone management- Pt has been taking Dexamethasone  2 mg tablets daily for pain since her discharge from hospital 06/26. [name] recently picked up refill of dex 2 mg. I told him that [name] said she can continue to take the dex 2 mg daily as long as it is helping with pain.    Dr [name] prescribed dexamethasone 20 mg weekly as part of her cancer treatment.  When she is at the cancer center for Durvalumab then the nurse will give her the dex 20 mg.  [name] voiced understanding that on the weeks she does not come in for Durvalumab tx that she needs to take her home supply of  dex 20 mg weekly with food.

## 2022-11-14 DIAGNOSIS — L89159 Pressure ulcer of sacral region, unspecified stage: Secondary | ICD-10-CM | POA: Diagnosis not present

## 2022-11-14 DIAGNOSIS — R52 Pain, unspecified: Secondary | ICD-10-CM | POA: Diagnosis not present

## 2022-11-14 DIAGNOSIS — C9000 Multiple myeloma not having achieved remission: Secondary | ICD-10-CM | POA: Diagnosis not present

## 2022-11-14 DIAGNOSIS — R4189 Other symptoms and signs involving cognitive functions and awareness: Secondary | ICD-10-CM | POA: Diagnosis not present

## 2022-11-15 DIAGNOSIS — C7951 Secondary malignant neoplasm of bone: Secondary | ICD-10-CM | POA: Diagnosis not present

## 2022-11-15 DIAGNOSIS — L89154 Pressure ulcer of sacral region, stage 4: Secondary | ICD-10-CM | POA: Diagnosis not present

## 2022-11-15 DIAGNOSIS — E43 Unspecified severe protein-calorie malnutrition: Secondary | ICD-10-CM | POA: Diagnosis not present

## 2022-11-15 DIAGNOSIS — G893 Neoplasm related pain (acute) (chronic): Secondary | ICD-10-CM | POA: Diagnosis not present

## 2022-11-15 DIAGNOSIS — I824Z2 Acute embolism and thrombosis of unspecified deep veins of left distal lower extremity: Secondary | ICD-10-CM | POA: Diagnosis not present

## 2022-11-15 DIAGNOSIS — C9000 Multiple myeloma not having achieved remission: Secondary | ICD-10-CM | POA: Diagnosis not present

## 2022-11-15 DIAGNOSIS — D630 Anemia in neoplastic disease: Secondary | ICD-10-CM | POA: Diagnosis not present

## 2022-11-15 DIAGNOSIS — I272 Pulmonary hypertension, unspecified: Secondary | ICD-10-CM | POA: Diagnosis not present

## 2022-11-15 DIAGNOSIS — I129 Hypertensive chronic kidney disease with stage 1 through stage 4 chronic kidney disease, or unspecified chronic kidney disease: Secondary | ICD-10-CM | POA: Diagnosis not present

## 2022-11-18 ENCOUNTER — Telehealth: Payer: Self-pay | Admitting: Medical Oncology

## 2022-11-18 ENCOUNTER — Other Ambulatory Visit: Payer: Self-pay | Admitting: Internal Medicine

## 2022-11-18 ENCOUNTER — Encounter: Payer: Medicare PPO | Admitting: Physician Assistant

## 2022-11-18 DIAGNOSIS — G893 Neoplasm related pain (acute) (chronic): Secondary | ICD-10-CM | POA: Diagnosis not present

## 2022-11-18 DIAGNOSIS — M6281 Muscle weakness (generalized): Secondary | ICD-10-CM | POA: Diagnosis not present

## 2022-11-18 DIAGNOSIS — Z9221 Personal history of antineoplastic chemotherapy: Secondary | ICD-10-CM | POA: Diagnosis not present

## 2022-11-18 DIAGNOSIS — I824Z2 Acute embolism and thrombosis of unspecified deep veins of left distal lower extremity: Secondary | ICD-10-CM | POA: Diagnosis not present

## 2022-11-18 DIAGNOSIS — I10 Essential (primary) hypertension: Secondary | ICD-10-CM | POA: Diagnosis not present

## 2022-11-18 DIAGNOSIS — I272 Pulmonary hypertension, unspecified: Secondary | ICD-10-CM | POA: Diagnosis not present

## 2022-11-18 DIAGNOSIS — L89154 Pressure ulcer of sacral region, stage 4: Secondary | ICD-10-CM | POA: Diagnosis not present

## 2022-11-18 DIAGNOSIS — C7951 Secondary malignant neoplasm of bone: Secondary | ICD-10-CM | POA: Diagnosis not present

## 2022-11-18 DIAGNOSIS — C9000 Multiple myeloma not having achieved remission: Secondary | ICD-10-CM | POA: Diagnosis not present

## 2022-11-18 DIAGNOSIS — E43 Unspecified severe protein-calorie malnutrition: Secondary | ICD-10-CM | POA: Diagnosis not present

## 2022-11-18 DIAGNOSIS — L89153 Pressure ulcer of sacral region, stage 3: Secondary | ICD-10-CM | POA: Diagnosis not present

## 2022-11-18 DIAGNOSIS — D630 Anemia in neoplastic disease: Secondary | ICD-10-CM | POA: Diagnosis not present

## 2022-11-18 DIAGNOSIS — I129 Hypertensive chronic kidney disease with stage 1 through stage 4 chronic kidney disease, or unspecified chronic kidney disease: Secondary | ICD-10-CM | POA: Diagnosis not present

## 2022-11-18 MED ORDER — OMEPRAZOLE 20 MG PO CPDR: 20.0000 mg | DELAYED_RELEASE_CAPSULE | Freq: Every day | ORAL | 3 refills | Status: DC

## 2022-11-18 NOTE — Progress Notes (Signed)
[name], [name] ([id number]) [id number] Page 1 of 7 Visit Report for 11/18/2022 Chief Complaint Document Details Patient Name: Date of Service: [name], [name] NN B. 11/18/2022 8:30 [name] M Medical Record Number: [id number] Patient Account Number: [id number] Date of Birth/Sex: Treating RN: Sep 17, 1940 (82 y.o. [name] Primary Care Provider: Hillard Danker Other Clinician: Referring Provider: Treating Provider/Extender: Madelynn Done Weeks in Treatment: 4 Information Obtained from: Patient Chief Complaint Stage 4 pressure ulcer Electronic Signature(s) Signed: 11/18/2022 8:43:35 AM By: Allen Derry PA-C Entered By: Allen Derry on 11/18/2022 08:43:35 -------------------------------------------------------------------------------- HPI Details Patient Name: Date of Service: [name] Number NN B. 11/18/2022 8:30 [name] M Medical Record Number: [id number] Patient Account Number: [id number] Date of Birth/Sex: Treating RN: [birth year] (82 y.o. 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 Currently there is not direct bone exposure because she is now covered with granulation tissue her family has been taking care of her at home at this point although she was previously in [name] facility. This is something that was noted when she ended up having to go to the ER initially on Aug 30, 2022. Subsequently during hospital stent it was started that she had gentamicin applied topically along with Dakin's moistened gauze packing which has been done at home now and seems to be doing very well. She was at [facility] skilled nursing  facility for time. She did have Levaquin previous which she did do very well for her as far as get the infection under control in my opinion I think she is actually seeming to do quite well at this point. She is not having any pain which is also excellent news at this time. She is starting to get around more so she is not completely bedbound although she is [name] little weak she does have family that is very attentive and taking great care of her. 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 There is an area of bone that is very thinly covered. Been using Dakin's wet-to-dry dressings. She has in-home caregivers 24/7 and also home health coming out to change dressings/participate in dressing supervision although most of her dressing changes are being done by her own in-home caregivers. 11-04-2022 upon evaluation today patient appears to be doing well currently in regard to her wound. With that being said I do feel like she is making really good progress towards healing currently. I do not see any evidence of active infection locally nor systemically which is excellent news as well. No fevers, chills, Gutmann, Madason B ([id number]) [id number] Page 2 of 7 nausea, vomiting, or diarrhea. 11-12-2022 patient's wound today appears to be doing okay although to be honest I am [name] little concerned about the fact that the wound VAC was not put on properly. It was not bridged she was laying on the tubing and this course was not very comfortable for her. Fortunately I do not see any signs of active infection locally or systemically which is great news. No fevers, chills, nausea, vomiting, or diarrhea. 11-18-2022 upon evaluation today patient appears to be  doing well currently in regard to her wound. This is actually showing  signs of improvement wound VAC was put on much more eloquently this time compared to last I am actually very pleased. He does not like there may be some irritation around the edges of the wound I think this may be subsequent to an issue going on with infection. We can look into trying to determine what this may be and then adjusting medications as needed to take care of that infection if so. Electronic Signature(s) Signed: 11/18/2022 9:13:50 AM By: Allen Derry PA-C Entered By: Allen Derry on 11/18/2022 09:13:50 -------------------------------------------------------------------------------- Physical Exam Details Patient Name: Date of Service: [name], [name] NN B. 11/18/2022 8:30 [name] M Medical Record Number: [id number] Patient Account Number: [id number] Date of Birth/Sex: Treating RN: [birth year] (82 y.o. [name] Primary Care Provider: Hillard Danker Other Clinician: Referring Provider: Treating Provider/Extender: Madelynn Done Weeks in Treatment: 4 Constitutional Well-nourished and well-hydrated in no acute distress. Respiratory normal breathing without difficulty. Psychiatric this patient is able to make decisions and demonstrates good insight into disease process. Alert and Oriented x 3. pleasant and cooperative. Notes Upon inspection patient's wound bed actually did appear to be showing signs of improvement and actually very pleased with the overall appearance of the wound the wound issue that I see is just some irritation and redness around the edges coupled with the fact she is having some pain on the tissue around the edges of the wound makes me wonder if there may not be some cellulitis here. I will grab [name] culture specifically [name] PCR culture and we will see what the results of that show making the adjustments in care is necessary. Electronic Signature(s) Signed: 11/18/2022 9:14:17 AM By: Allen Derry PA-C Entered By: Allen Derry on 11/18/2022  09:14:17 -------------------------------------------------------------------------------- Physician Orders Details Patient Name: Date of Service: [name] Number NN B. 11/18/2022 8:30 [name] M Medical Record Number: [id number] Patient Account Number: [id number] Date of Birth/Sex: Treating RN: [birth year] (82 y.o. [name] Primary Care Provider: Hillard Danker Other Clinician: Pricilla Holm, Tamsin B ([id number]) [id number] Page 3 of 7 Referring Provider: Treating Provider/Extender: Madelynn Done Weeks in Treatment: 4 Verbal / Phone Orders: No Diagnosis Coding ICD-10 Coding Code Description L89.154 Pressure ulcer of sacral region, stage 4 M62.81 Muscle weakness (generalized) C90.00 Multiple myeloma not having achieved remission Follow-up Appointments Return Appointment in 1 week. Home Health [facility] for wound care. May utilize formulary equivalent dressing for wound treatment orders unless otherwise specified. Home Health Nurse may visit PRN to address patients wound care needs. - [facility]--Remove dressing and clean the wound with normal saline, use skin barrier wipes prior to apply the drape. Apply wound vac foam and use the Duoderm (may cut in [name] U shape). Please use drape to outline the wound prior to placing the foam dressing in the wound. Please bridge the vac from the wound up to the hip to prevent lying on the tubing. Must use foam over the drape to bridge. Please see photo sent with patient and link for video. [email or web address] Change MWF. Patient will come to wound center on Monday. We will remove vac and HH RN will need to come in the afternoon to replace the vac. Please call if you have any questions. Patient must be lying in bed. Bathing/ Shower/ Hygiene May shower; gently cleanse wound with antibacterial soap, rinse and pat dry prior to dressing wounds Off-Loading Low air-loss mattress (Group 2) Turn and  reposition every 2 hours  Negative Pressure Wound Therapy Other: - NPWT to be applied on Monday 11/04/22 by [facility] nurse Wound Treatment Wound #1 - Coccyx Wound Laterality: Midline Cleanser: Dakin 16 (oz) 0.25 (Home Health) 1 x Per Day/30 Days Discharge Instructions: Moisten gauze and place on the wound Cleanser: Normal Saline (Home Health) 1 x Per Day/30 Days Discharge Instructions: Wash your hands with soap and water. Remove old dressing, discard into plastic bag and place into trash. Cleanse the wound with Normal Saline prior to applying [name] clean dressing using gauze sponges, not tissues or cotton balls. Do not scrub or use excessive force. Pat dry using gauze sponges, not tissue or cotton balls. Prim Dressing: Gauze (Home Health) 1 x Per Day/30 Days ary Discharge Instructions: As directed: dry, moistened with saline or moistened with Dakins Solution Secondary Dressing: ABD Pad 5x9 (in/in) (Home Health) 1 x Per Day/30 Days Discharge Instructions: Cover with ABD pad Secured With: Medipore T - 41M Medipore H Soft Cloth Surgical T ape ape, 2x2 (in/yd) (Home Health) 1 x Per Day/30 Days Laboratory Bacteria identified in Wound by Culture (MICRO) LOINC Code: [phone] Convenience Name: Wound culture routine Electronic Signature(s) Unsigned Entered By: Midge Aver on 11/18/2022 09:09:30 Signature(s): Illingworth, Lakeyn B ([id number]) [phone] Date(s): [id number] Page 4 of 7 -------------------------------------------------------------------------------- Problem List Details Patient Name: Date of Service: [name], [name] NN B. 11/18/2022 8:30 [name] M Medical Record Number: [id number] Patient Account Number: [id number] Date of Birth/Sex: Treating RN: September 30, 1940 (82 y.o. [name] Primary Care Provider: Hillard Danker Other Clinician: Referring Provider: Treating Provider/Extender: Madelynn Done Weeks in Treatment: 4 Active Problems ICD-10 Encounter Code Description  Active Date MDM Diagnosis L89.154 Pressure ulcer of sacral region, stage 4 10/21/2022 No Yes M62.81 Muscle weakness (generalized) 10/21/2022 No Yes C90.00 Multiple myeloma not having achieved remission 10/21/2022 No Yes Inactive Problems Resolved Problems Electronic Signature(s) Signed: 11/18/2022 8:43:29 AM By: Allen Derry PA-C Entered By: Allen Derry on 11/18/2022 08:43:29 -------------------------------------------------------------------------------- Progress Note Details Patient Name: Date of Service: [name] Number NN B. 11/18/2022 8:30 [name] M Medical Record Number: [id number] Patient Account Number: [id number] Date of Birth/Sex: Treating RN: Nov 13, 1940 (82 y.o. 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 Currently there is not direct bone exposure because she is now covered with granulation tissue her family has been taking care of her at home at this point although she was previously in [name] facility. This is something that was noted when she ended up having to go to the ER initially on Aug 30, 2022. Subsequently during hospital stent it was started that she had gentamicin applied topically along with Dakin's moistened gauze packing which has been done at home now and seems to be doing very well. She was at [facility] skilled nursing facility for time. She did have Levaquin previous which she did do very well for her as far as get the infection under control in my opinion I think  she is actually seeming to do quite well at this point. She is not having any pain which is also excellent news at this time. She is starting to get around  more so she is not completely bedbound although she is [name] little weak she does have family that is very attentive and taking great care of her. 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 There is an area of bone that is very thinly covered. Been using Dakin's wet-to-dry dressings. She has in-home caregivers 24/7 and also home health coming out to change dressings/participate in dressing supervision although most of her dressing changes are being done by her own in-home caregivers. 11-04-2022 upon evaluation today patient appears to be doing well currently in regard to her wound. With that being said I do feel like she is making really good progress towards healing currently. I do not see any evidence of active infection locally nor systemically which is excellent news as well. No fevers, chills, nausea, vomiting, or diarrhea. 11-12-2022 patient's wound today appears to be doing okay although to be honest I am [name] little concerned about the fact that the wound VAC was not put on properly. It was not bridged she was laying on the tubing and this course was not very comfortable for her. Fortunately I do not see any signs of active infection locally or systemically which is great news. No fevers, chills, nausea, vomiting, or diarrhea. 11-18-2022 upon evaluation today patient appears to be doing well currently in regard to her wound. This is actually showing signs of improvement wound VAC was put on much more eloquently this time compared to last I am actually very pleased. He does not like there may be some irritation around the edges of the wound I think this may be subsequent to an  issue going on with infection. We can look into trying to determine what this may be and then adjusting medications as needed to take care of that infection if so. Objective Constitutional Well-nourished and well-hydrated in no acute distress. Vitals Time Taken: 8:44 AM, Temperature: 98.3 F, Pulse: 83 bpm, Respiratory Rate: 20 breaths/min, Blood Pressure: 103/51 mmHg. Respiratory normal breathing without difficulty. Psychiatric this patient is able to make decisions and demonstrates good insight into disease process. Alert and Oriented x 3. pleasant and cooperative. General Notes: Upon inspection patient's wound bed actually did appear to be showing signs of improvement and actually very pleased with the overall appearance of the wound the wound issue that I see is just some irritation and redness around the edges coupled with the fact she is having some pain on the tissue around the edges of the wound makes me wonder if there may not be some cellulitis here. I will grab [name] culture specifically [name] PCR culture and we will see what the results of that show making the adjustments in care is necessary. Integumentary (Hair, Skin) Wound #1 status is Open. Original cause of wound was Pressure Injury. The date acquired was: 08/30/2022. The wound has been in treatment 4 weeks. The wound is located on the Midline Coccyx. The wound measures 5.5cm length x 4cm width x 1.7cm depth; 17.279cm^2 area and 29.374cm^3 volume. There is bone and Fat Layer (Subcutaneous Tissue) exposed. There is no undermining noted, however, there is tunneling at 12:00 with [name] maximum distance of 1.5cm. There is [name] medium amount of serosanguineous drainage noted. There is medium (34-66%) red, pink granulation within the wound bed. There is no necrotic tissue within the wound bed. Assessment Active Problems ICD-10 Pressure  ulcer of sacral region, stage 4 Muscle weakness (generalized) Multiple myeloma not having achieved  remission Plan Follow-up Appointments: Return Appointment in 1 week. CORDNER, Shanyla B ([id number]) [id number] Page 6 of 7 Home Health: [facility] for wound care. May utilize formulary equivalent dressing for wound treatment orders unless otherwise specified. Home Health Nurse may visit PRN to address patients wound care needs. - [facility]--Remove dressing and clean the wound with normal saline, use skin barrier wipes prior to apply the drape. Apply wound vac foam and use the Duoderm (may cut in [name] U shape). Please use drape to outline the wound prior to placing the foam dressing in the wound. Please bridge the vac from the wound up to the hip to prevent lying on the tubing. Must use foam over the drape to bridge. Please see photo sent with patient and link for video. [email or web address] Change MWF. Patient will come to wound center on Monday. We will remove vac and HH RN will need to come in the afternoon to replace the vac. Please call if you have any questions. Patient must be lying in bed. Bathing/ Shower/ Hygiene: May shower; gently cleanse wound with antibacterial soap, rinse and pat dry prior to dressing wounds Off-Loading: Low air-loss mattress (Group 2) Turn and reposition every 2 hours Negative Pressure Wound Therapy: Other: - NPWT to be applied on Monday 11/04/22 by [facility] nurse Laboratory ordered were: Wound culture routine WOUND #1: - Coccyx Wound Laterality: Midline Cleanser: Dakin 16 (oz) 0.25 (Home Health) 1 x Per Day/30 Days Discharge Instructions: Moisten gauze and place on the wound Cleanser: Normal Saline (Home Health) 1 x Per Day/30 Days Discharge Instructions: Wash your hands with soap and water. Remove old dressing, discard into plastic bag and place into trash. Cleanse the wound with Normal Saline prior to applying [name] clean dressing using gauze sponges, not tissues or cotton balls. Do not scrub or use  excessive force. Pat dry using gauze sponges, not tissue or cotton balls. Prim Dressing: Gauze (Home Health) 1 x Per Day/30 Days ary Discharge Instructions: As directed: dry, moistened with saline or moistened with Dakins Solution Secondary Dressing: ABD Pad 5x9 (in/in) (Home Health) 1 x Per Day/30 Days Discharge Instructions: Cover with ABD pad Secured With: Medipore T - 33M Medipore H Soft Cloth Surgical T ape ape, 2x2 (in/yd) (Home Health) 1 x Per Day/30 Days 1. Based on what I am seeing I do believe the patient would benefit from [name] continuation of therapy with regard to the wound VAC which I think is really doing an awesome job.. 2. We are going to put [name] wet-to-dry dressing on for now Home health will be coming out later today to put the wound VAC back in place. 3. I am going to recommend that we have the patient continue to monitor for any signs of infection or worsening this will mainly be her caregiver who is keeping eye on things. 4. Dependent on the results of the PCR culture as soon as I get that back which hopefully should be in short order in the next couple of days I will make any adjustments in care is necessary depending on that result. We will see patient back for reevaluation in 1 week here in the clinic. If anything worsens or changes patient will contact our office for additional recommendations. Electronic Signature(s) Signed: 11/18/2022 9:15:12 AM By: Allen Derry PA-C Entered By: Allen Derry on 11/18/2022 09:15:12 -------------------------------------------------------------------------------- SuperBill Details Patient Name: Date of Service: [name], [name] NN B. 11/18/2022  Medical Record Number: [id number] Patient Account Number: [id number] Date of Birth/Sex: Treating RN: [birth year] (82 y.o. [name] Primary Care Provider: Hillard Danker Other Clinician: Referring Provider: Treating Provider/Extender: Madelynn Done Weeks in Treatment: 4 Diagnosis  Coding ICD-10 Codes Code Description [phone] Pressure ulcer of sacral region, stage 4 M62.81 Muscle weakness (generalized) C90.00 Multiple myeloma not having achieved remission Facility Procedures : Binette, [name] 7 CPT4 Code: NN B ([id number]) [id number] Description: [id number] - WOUND CARE VISIT-LEV 3 EST PT Modifier: [id number] Page 7 of 7 1 Quantity: Physician Procedures : CPT4 Code Description Modifier [id number] - WC PHYS LEVEL 3 - EST PT ICD-10 Diagnosis Description L89.154 Pressure ulcer of sacral region, stage 4 M62.81 Muscle weakness (generalized) C90.00 Multiple myeloma not having achieved remission Quantity: 1 Electronic Signature(s) Signed: 11/18/2022 9:15:50 AM By: Allen Derry PA-C Entered By: Allen Derry on 11/18/2022 09:15:50

## 2022-11-18 NOTE — Progress Notes (Signed)
RINGOR, [name] ([id number]) [id number] Page 1 of 9 Visit Report for 11/18/2022 Arrival Information Details Patient Name: Date of Service: [name], [name] [name] 11/18/2022 8:30 A M Medical Record Number: [id number] Patient Account Number: [id number] Date of Birth/Sex: Treating RN: [name] (82 y.o. [name] Other Clinician: Referring : Treating /Extender: [name] Weeks in Treatment: 4 Visit Information History Since Last Visit Added or deleted any medications: No Patient Arrived: [name] Any new allergies or adverse reactions: No Arrival Time: 08:42 Has Dressing in Place as Prescribed: Yes Accompanied By: daughter in law Pain Present Now: No Transfer Assistance: None Patient Identification Verified: Yes Secondary Verification Process Completed: Yes Patient Requires Transmission-Based Precautions: No Patient Has Alerts: Yes Patient Alerts: Not Diabetic Electronic Signature(s) Unsigned Entered By: Midge Aver on 11/18/2022 08:43:41 -------------------------------------------------------------------------------- Clinic Level of Care Assessment Details Patient Name: Date of Service: [name], [name] 11/18/2022 8:30 A M Medical Record Number: [id number] Patient Account Number: [id number] Date of Birth/Sex: Treating RN: [name] (82 y.o. [name] Other Clinician: Referring : Treating /Extender: [name] Weeks in Treatment: 4 Clinic Level of Care Assessment Items TOOL 4 Quantity Score X- 1 0 Use when only an EandM is performed on FOLLOW-UP visit ASSESSMENTS - Nursing Assessment / Reassessment X- 1 10 Reassessment of Co-morbidities (includes updates in patient status) X- 1 5 Reassessment of Adherence to Treatment Plan ASSESSMENTS - Wound and Skin Assessment / Reassessment [name] ([id number])  [id number] Page 2 of 9 X- 1 5 Simple Wound Assessment / Reassessment - one wound []  - 0 Complex Wound Assessment / Reassessment - multiple wounds []  - 0 Dermatologic / Skin Assessment (not related to wound area) ASSESSMENTS - Focused Assessment []  - 0 Circumferential Edema Measurements - multi extremities []  - 0 Nutritional Assessment / Counseling / Intervention []  - 0 Lower Extremity Assessment (monofilament, tuning fork, pulses) []  - 0 Peripheral Arterial Disease Assessment (using hand held doppler) ASSESSMENTS - Ostomy and/or Continence Assessment and Care []  - 0 Incontinence Assessment and Management []  - 0 Ostomy Care Assessment and Management (repouching, etc.) PROCESS - Coordination of Care X - Simple Patient / Family Education for ongoing care 1 15 []  - 0 Complex (extensive) Patient / Family Education for ongoing care X- 1 10 Staff obtains Chiropractor, Records, T Results / Process Orders est []  - 0 Staff telephones HHA, Nursing Homes / Clarify orders / etc []  - 0 Routine Transfer to another Facility (non-emergent condition) []  - 0 Routine Hospital Admission (non-emergent condition) []  - 0 New Admissions / Manufacturing engineer / Ordering NPWT Apligraf, etc. , []  - 0 Emergency Hospital Admission (emergent condition) X- 1 10 Simple Discharge Coordination []  - 0 Complex (extensive) Discharge Coordination PROCESS - Special Needs []  - 0 Pediatric / Minor Patient Management []  - 0 Isolation Patient Management []  - 0 Hearing / Language / Visual special needs []  - 0 Assessment of Community assistance (transportation, D/C planning, etc.) []  - 0 Additional assistance / Altered mentation []  - 0 Support Surface(s) Assessment (bed, cushion, seat, etc.) INTERVENTIONS - Wound Cleansing / Measurement X - Simple Wound Cleansing - one wound 1 5 []  - 0 Complex Wound Cleansing - multiple wounds X- 1 5 Wound Imaging (photographs - any number  of wounds) []  - 0 Wound Tracing (instead of photographs) X- 1 5 Simple Wound Measurement - one wound []  - 0 Complex Wound Measurement - multiple wounds INTERVENTIONS -  Wound Dressings []  - 0 Small Wound Dressing one or multiple wounds X- 1 15 Medium Wound Dressing one or multiple wounds []  - 0 Large Wound Dressing one or multiple wounds []  - 0 Application of Medications - topical []  - 0 Application of Medications - injection INTERVENTIONS - Miscellaneous []  - 0 External ear exam []  - 0 Specimen Collection (cultures, biopsies, blood, body fluids, etc.) [name], [name] ([id number]) [id number] Page 3 of 9 []  - 0 Specimen(s) / Culture(s) sent or taken to Lab for analysis []  - 0 Patient Transfer (multiple staff / Michiel Sites Lift / Similar devices) []  - 0 Simple Staple / Suture removal (25 or less) []  - 0 Complex Staple / Suture removal (26 or more) []  - 0 Hypo / Hyperglycemic Management (close monitor of Blood Glucose) []  - 0 Ankle / Brachial Index (ABI) - do not check if billed separately X- 1 5 Vital Signs Has the patient been seen at the hospital within the last three years: Yes Total Score: 90 Level Of Care: New/Established - Level 3 Electronic Signature(s) Unsigned Entered By: Midge Aver on 11/18/2022 09:05:16 -------------------------------------------------------------------------------- Encounter Discharge Information Details Patient Name: Date of Service: [name], [name] 11/18/2022 8:30 A M Medical Record Number: [id number] Patient Account Number: [id number] Date of Birth/Sex: Treating RN: [birth year] (82 y.o. [name] Other Clinician: Referring : Treating /Extender: [name] Weeks in Treatment: 4 Encounter Discharge Information Items Discharge Condition: Stable Ambulatory Status: Walker Discharge Destination: Home Transportation: Private Auto Accompanied By:  daughter in law Schedule Follow-up Appointment: Yes Clinical Summary of Care: Electronic Signature(s) Unsigned Entered By: Midge Aver on 11/18/2022 09:09:58 Signature(s): [name] ([id number]) [id number] Date(s): [id number] Page 4 of 9 -------------------------------------------------------------------------------- Lower Extremity Assessment Details Patient Name: Date of Service: [name], [name] 11/18/2022 8:30 A M Medical Record Number: [id number] Patient Account Number: [id number] Date of Birth/Sex: Treating RN: [name] (82 y.o. [name] Other Clinician: Referring : Treating /Extender: [name] Weeks in Treatment: 4 Electronic Signature(s) Unsigned Entered By: Midge Aver on 11/18/2022 08:56:18 -------------------------------------------------------------------------------- Multi Wound Chart Details Patient Name: Date of Service: [name], [name] 11/18/2022 8:30 A M Medical Record Number: [id number] Patient Account Number: [id number] Date of Birth/Sex: Treating RN: [birth year][name] (82 y.o. [name] Other Clinician: Referring : Treating /Extender: [name] Weeks in Treatment: 4 Vital Signs Height(in): Pulse(bpm): 83 Weight(lbs): Blood Pressure(mmHg): 103/51 Body Mass Index(BMI): Temperature(F): 98.3 Respiratory Rate(breaths/min): 20 [1:Photos:] [N/A:N/A] Midline Coccyx N/A N/A Wound Location: Pressure Injury N/A N/A Wounding Event: Pressure Ulcer N/A N/A Primary Etiology: Hypertension, Received N/A N/A Comorbid History: Chemotherapy 08/30/2022 N/A N/A Date Acquired: 4 N/A N/A Weeks of Treatment: Open N/A N/A Wound Status: No N/A N/A Wound Recurrence: 5.5x4x1.7 N/A N/A Measurements L x W x D (cm) 17.279 N/A N/A A (cm) : rea 29.374 N/A N/A Volume (cm) : 60.20% N/A N/A % Reduction in A  rea: 72.90% N/A N/A % Reduction in Volume: 12 Position 1 (o'clock): 1.5 Maximum Distance 1 (cm): Yes N/A N/A Tunneling: Category/Stage IV N/A N/A Classification: Medium N/A N/A Exudate A mount: Serosanguineous N/A N/A Exudate Type: Faeth, Orville [name] ([id number]) [id number] Page 5 of 9 red, brown N/A N/A Exudate Color: Medium (34-66%) N/A N/A Granulation Amount: Red, Pink N/A N/A Granulation Quality: None Present (0%) N/A N/A Necrotic Amount: Fat Layer (Subcutaneous Tissue): Yes N/A N/A Exposed Structures: Bone: Yes Fascia:  No Tendon: No Muscle: No Joint: No Treatment Notes Electronic Signature(s) Unsigned Entered By: Midge Aver on 11/18/2022 08:56:42 -------------------------------------------------------------------------------- Multi-Disciplinary Care Plan Details Patient Name: Date of Service: [name], [name] 11/18/2022 8:30 A M Medical Record Number: [id number] Patient Account Number: [id number] Date of Birth/Sex: Treating RN: [name] (82 y.o. [name] Other Clinician: Referring : Treating /Extender: [name] Weeks in Treatment: 4 Active Inactive Pressure Nursing Diagnoses: Knowledge deficit related to causes and risk factors for pressure ulcer development Knowledge deficit related to management of pressures ulcers Goals: Patient will remain free from development of additional pressure ulcers Date Initiated: 10/21/2022 Target Resolution Date: 11/21/2022 Goal Status: Active Patient will remain free of pressure ulcers Date Initiated: 10/21/2022 Target Resolution Date: 11/21/2022 Goal Status: Active Patient/caregiver will verbalize risk factors for pressure ulcer development Date Initiated: 10/21/2022 Target Resolution Date: 11/21/2022 Goal Status: Active Patient/caregiver will verbalize understanding of pressure ulcer management Date Initiated:  10/21/2022 Target Resolution Date: 11/21/2022 Goal Status: Active Interventions: Assess: immobility, friction, shearing, incontinence upon admission and as needed Assess offloading mechanisms upon admission and as needed Assess potential for pressure ulcer upon admission and as needed Provide education on pressure ulcers Notes: Wound/Skin [name], [name] ([id number]) [id number] Page 6 of 9 Nursing Diagnoses: Impaired tissue integrity Knowledge deficit related to ulceration/compromised skin integrity Goals: Patient/caregiver will verbalize understanding of skin care regimen Date Initiated: 10/21/2022 Target Resolution Date: 11/21/2022 Goal Status: Active Ulcer/skin breakdown will have a volume reduction of 30% by week 4 Date Initiated: 10/21/2022 Target Resolution Date: 11/21/2022 Goal Status: Active Ulcer/skin breakdown will have a volume reduction of 50% by week 8 Date Initiated: 10/21/2022 Target Resolution Date: 12/22/2022 Goal Status: Active Ulcer/skin breakdown will have a volume reduction of 80% by week 12 Date Initiated: 10/21/2022 Target Resolution Date: 01/21/2023 Goal Status: Active Ulcer/skin breakdown will heal within 14 weeks Date Initiated: 10/21/2022 Target Resolution Date: 02/04/2023 Goal Status: Active Interventions: Assess patient/caregiver ability to obtain necessary supplies Assess patient/caregiver ability to perform ulcer/skin care regimen upon admission and as needed Assess ulceration(s) every visit Provide education on ulcer and skin care Treatment Activities: Patient referred to home care : 10/21/2022 Skin care regimen initiated : 10/21/2022 Topical wound management initiated : 10/21/2022 Notes: Electronic Signature(s) Unsigned Entered By: Midge Aver on 11/18/2022 64:40:34 -------------------------------------------------------------------------------- Pain Assessment Details Patient Name: Date of Service: [name], [name]  [name] 11/18/2022 8:30 A M Medical Record Number: [id number] Patient Account Number: [id number] Date of Birth/Sex: Treating RN: [birth year] [name] (82 y.o. [name] Other Clinician: Referring : Treating /Extender: [name] Weeks in Treatment: 4 Active Problems Location of Pain Severity and Description of Pain Patient Has Paino Yes Site Locations Rate the pain. BRACKEN, Johnae [name] ([id number]) [id number] Page 7 of 9 Rate the pain. Current Pain Level: 7 Pain Management and Medication Current Pain Management: Electronic Signature(s) Unsigned Entered By: Midge Aver on 11/18/2022 08:47:17 -------------------------------------------------------------------------------- Patient/Caregiver Education Details Patient Name: Date of Service: [name], [name] 8/12/2024andnbsp8:30 A M Medical Record Number: [id number] Patient Account Number: [id number] Date of Birth/Gender: Treating RN: [birth year][name] (82 y.o. [name] Primary Care Physician: Hillard [name] Other Clinician: Referring Physician: Treating Physician/Extender: [name] Weeks in Treatment: 4 Education Assessment Education Provided To: Patient Education Topics Provided Wound/Skin Impairment: Handouts: Caring for Your Ulcer Methods: Explain/Verbal Responses: State content correctly Electronic Signature(s) Unsigned Entered By: Midge Aver on 11/18/2022 09:08:35 Signature(s): [name], [name] ([id number]) 129  Date(s): [id number] Page 8 of 9 -------------------------------------------------------------------------------- Wound Assessment Details Patient Name: Date of Service: SHAQUNDA, BEDOYA [name] 11/18/2022 8:30 A M Medical Record Number: [id number] Patient Account Number: [id number] Date of Birth/Sex: Treating RN: [name] (82 y.o. [name] Other Clinician: Referring  : Treating /Extender: [name] Weeks in Treatment: 4 Wound Status Wound Number: 1 Primary Etiology: Pressure Ulcer Wound Location: Midline Coccyx Wound Status: Open Wounding Event: Pressure Injury Comorbid History: Hypertension, Received Chemotherapy Date Acquired: 08/30/2022 Weeks Of Treatment: 4 Clustered Wound: No Photos Wound Measurements Length: (cm) 5.5 Width: (cm) 4 Depth: (cm) 1.7 Area: (cm) 17.279 Volume: (cm) 29.374 % Reduction in Area: 60.2% % Reduction in Volume: 72.9% Tunneling: Yes Position (o'clock): 12 Maximum Distance: (cm) 1.5 Undermining: No Wound Description Classification: Category/Stage IV Exudate Amount: Medium Exudate Type: Serosanguineous Exudate Color: red, brown Foul Odor After Cleansing: No Slough/Fibrino No Wound Bed Granulation Amount: Medium (34-66%) Exposed Structure Granulation Quality: Red, Pink Fascia Exposed: No Necrotic Amount: None Present (0%) Fat Layer (Subcutaneous Tissue) Exposed: Yes Tendon Exposed: No Muscle Exposed: No Joint Exposed: No Bone Exposed: Yes Treatment Notes Wound #1 (Coccyx) Wound Laterality: Midline Cleanser Dakin 16 (oz) 0.25 Discharge Instruction: Moisten gauze and place on the wound Antony, Katrina [name] ([id number]) [id number] Page 9 of 9 Normal Saline Discharge Instruction: Wash your hands with soap and water. Remove old dressing, discard into plastic bag and place into trash. Cleanse the wound with Normal Saline prior to applying a clean dressing using gauze sponges, not tissues or cotton balls. Do not scrub or use excessive force. Pat dry using gauze sponges, not tissue or cotton balls. Peri-Wound Care Topical Primary Dressing Gauze Discharge Instruction: As directed: dry, moistened with saline or moistened with Dakins Solution Secondary Dressing ABD Pad 5x9 (in/in) Discharge Instruction: Cover with ABD pad Secured With Medipore T - 51M  Medipore H Soft Cloth Surgical T ape ape, 2x2 (in/yd) Compression Wrap Compression Stockings Add-Ons Electronic Signature(s) Unsigned Entered By: Midge Aver on 11/18/2022 08:55:48 -------------------------------------------------------------------------------- Vitals Details Patient Name: Date of Service: CATHEY, BATTLE [name] 11/18/2022 8:30 A M Medical Record Number: [id number] Patient Account Number: [id number] Date of Birth/Sex: Treating RN: [birth year] [name] (82 y.o. [name] Other Clinician: Referring : Treating /Extender: [name] Weeks in Treatment: 4 Vital Signs Time Taken: 08:44 Temperature (F): 98.3 Pulse (bpm): 83 Respiratory Rate (breaths/min): 20 Blood Pressure (mmHg): 103/51 Reference Range: 80 - 120 mg / dl Electronic Signature(s) Unsigned Entered By: Midge Aver on 11/18/2022 08:47:05 Signature(s): Date(s):

## 2022-11-18 NOTE — Telephone Encounter (Signed)
Pantoprozole rx completed.   Should she continue it ?  If so , she needs a refill.  It was prescribed at her hospital discharge.

## 2022-11-18 NOTE — Telephone Encounter (Signed)
:  VM on [name]'s phone that Dr. Arbutus Ped wants Dewayne Hatch to stay on PPI.

## 2022-11-20 ENCOUNTER — Other Ambulatory Visit: Payer: Self-pay

## 2022-11-20 ENCOUNTER — Inpatient Hospital Stay: Payer: Medicare PPO | Admitting: Internal Medicine

## 2022-11-20 ENCOUNTER — Inpatient Hospital Stay: Payer: Medicare PPO | Attending: Internal Medicine

## 2022-11-20 ENCOUNTER — Inpatient Hospital Stay: Payer: Medicare PPO

## 2022-11-20 DIAGNOSIS — I824Z2 Acute embolism and thrombosis of unspecified deep veins of left distal lower extremity: Secondary | ICD-10-CM | POA: Diagnosis not present

## 2022-11-20 DIAGNOSIS — I272 Pulmonary hypertension, unspecified: Secondary | ICD-10-CM | POA: Diagnosis not present

## 2022-11-20 DIAGNOSIS — I129 Hypertensive chronic kidney disease with stage 1 through stage 4 chronic kidney disease, or unspecified chronic kidney disease: Secondary | ICD-10-CM | POA: Diagnosis not present

## 2022-11-20 DIAGNOSIS — D630 Anemia in neoplastic disease: Secondary | ICD-10-CM | POA: Diagnosis not present

## 2022-11-20 DIAGNOSIS — E43 Unspecified severe protein-calorie malnutrition: Secondary | ICD-10-CM | POA: Diagnosis not present

## 2022-11-20 DIAGNOSIS — C9000 Multiple myeloma not having achieved remission: Secondary | ICD-10-CM | POA: Insufficient documentation

## 2022-11-20 DIAGNOSIS — D649 Anemia, unspecified: Secondary | ICD-10-CM | POA: Diagnosis not present

## 2022-11-20 DIAGNOSIS — G893 Neoplasm related pain (acute) (chronic): Secondary | ICD-10-CM | POA: Diagnosis not present

## 2022-11-20 DIAGNOSIS — Z79624 Long term (current) use of inhibitors of nucleotide synthesis: Secondary | ICD-10-CM | POA: Insufficient documentation

## 2022-11-20 DIAGNOSIS — Z5112 Encounter for antineoplastic immunotherapy: Secondary | ICD-10-CM | POA: Insufficient documentation

## 2022-11-20 DIAGNOSIS — Z79899 Other long term (current) drug therapy: Secondary | ICD-10-CM | POA: Insufficient documentation

## 2022-11-20 DIAGNOSIS — L89154 Pressure ulcer of sacral region, stage 4: Secondary | ICD-10-CM | POA: Diagnosis not present

## 2022-11-20 DIAGNOSIS — C7951 Secondary malignant neoplasm of bone: Secondary | ICD-10-CM | POA: Diagnosis not present

## 2022-11-20 LAB — CBC WITH DIFFERENTIAL (CANCER CENTER ONLY)
Abs Immature Granulocytes: 0.08 10*3/uL — ABNORMAL HIGH (ref 0.00–0.07)
Basophils Absolute: 0.1 10*3/uL (ref 0.0–0.1)
Basophils Relative: 1 %
Eosinophils Absolute: 0.2 10*3/uL (ref 0.0–0.5)
Eosinophils Relative: 2 %
HCT: 33.4 % — ABNORMAL LOW (ref 36.0–46.0)
Hemoglobin: 10.9 g/dL — ABNORMAL LOW (ref 12.0–15.0)
Immature Granulocytes: 1 %
Lymphocytes Relative: 22 %
Lymphs Abs: 1.9 10*3/uL (ref 0.7–4.0)
MCH: 32.8 pg (ref 26.0–34.0)
MCHC: 32.6 g/dL (ref 30.0–36.0)
MCV: 100.6 fL — ABNORMAL HIGH (ref 80.0–100.0)
Monocytes Absolute: 1.2 10*3/uL — ABNORMAL HIGH (ref 0.1–1.0)
Monocytes Relative: 14 %
Neutro Abs: 5.3 10*3/uL (ref 1.7–7.7)
Neutrophils Relative %: 60 %
Platelet Count: 141 10*3/uL — ABNORMAL LOW (ref 150–400)
RBC: 3.32 MIL/uL — ABNORMAL LOW (ref 3.87–5.11)
RDW: 19.6 % — ABNORMAL HIGH (ref 11.5–15.5)
WBC Count: 8.8 10*3/uL (ref 4.0–10.5)
nRBC: 0 % (ref 0.0–0.2)

## 2022-11-20 LAB — CMP (CANCER CENTER ONLY)
ALT: 40 U/L (ref 0–44)
AST: 21 U/L (ref 15–41)
Albumin: 3 g/dL — ABNORMAL LOW (ref 3.5–5.0)
Alkaline Phosphatase: 213 U/L — ABNORMAL HIGH (ref 38–126)
Anion gap: 9 (ref 5–15)
BUN: 30 mg/dL — ABNORMAL HIGH (ref 8–23)
CO2: 27 mmol/L (ref 22–32)
Calcium: 7.8 mg/dL — ABNORMAL LOW (ref 8.9–10.3)
Chloride: 106 mmol/L (ref 98–111)
Creatinine: 0.63 mg/dL (ref 0.44–1.00)
GFR, Estimated: >60 mL/min (ref >60)
Glucose, Bld: 144 mg/dL — ABNORMAL HIGH (ref 70–99)
Potassium: 3.3 mmol/L — ABNORMAL LOW (ref 3.5–5.1)
Sodium: 142 mmol/L (ref 135–145)
Total Bilirubin: 0.6 mg/dL (ref 0.3–1.2)
Total Protein: 5.1 g/dL — ABNORMAL LOW (ref 6.5–8.1)

## 2022-11-20 LAB — LACTATE DEHYDROGENASE: LDH: 224 U/L — ABNORMAL HIGH (ref 98–192)

## 2022-11-20 MED ORDER — DARATUMUMAB-HYALURONIDASE-FIHJ 1800-30000 MG-UT/15ML ~~LOC~~ SOLN
1800.0000 mg | Freq: Once | SUBCUTANEOUS | Status: AC
  Administered 2022-11-20: 1800 mg via SUBCUTANEOUS
  Filled 2022-11-20: qty 15

## 2022-11-20 MED ORDER — DIPHENHYDRAMINE HCL 25 MG PO CAPS
50.0000 mg | ORAL_CAPSULE | Freq: Once | ORAL | Status: AC
  Administered 2022-11-20: 50 mg via ORAL
  Filled 2022-11-20: qty 2

## 2022-11-20 MED ORDER — ACETAMINOPHEN 325 MG PO TABS
650.0000 mg | ORAL_TABLET | Freq: Once | ORAL | Status: AC
  Administered 2022-11-20: 650 mg via ORAL
  Filled 2022-11-20: qty 2

## 2022-11-20 MED ORDER — DEXAMETHASONE 4 MG PO TABS
20.0000 mg | ORAL_TABLET | Freq: Once | ORAL | Status: AC
  Administered 2022-11-20: 20 mg via ORAL
  Filled 2022-11-20: qty 5

## 2022-11-20 NOTE — Patient Instructions (Signed)
[facility]  Discharge Instructions: Thank you for choosing [facility] to provide your oncology and hematology care.   If you have a lab appointment with the Cancer Center, please go directly to the Cancer Center and check in at the registration area.   Wear comfortable clothing and clothing appropriate for easy access to any Portacath or PICC line.   We strive to give you quality time with your provider. You may need to reschedule your appointment if you arrive late (15 or more minutes).  Arriving late affects you and other patients whose appointments are after yours.  Also, if you miss three or more appointments without notifying the office, you may be dismissed from the clinic at the provider's discretion.      For prescription refill requests, have your pharmacy contact our office and allow 72 hours for refills to be completed.    Today you received the following chemotherapy and/or immunotherapy agents: Dara Faspro.    To help prevent nausea and vomiting after your treatment, we encourage you to take your nausea medication as directed.  BELOW ARE SYMPTOMS THAT SHOULD BE REPORTED IMMEDIATELY: *FEVER GREATER THAN 100.4 F (38 C) OR HIGHER *CHILLS OR SWEATING *NAUSEA AND VOMITING THAT IS NOT CONTROLLED WITH YOUR NAUSEA MEDICATION *UNUSUAL SHORTNESS OF BREATH *UNUSUAL BRUISING OR BLEEDING *URINARY PROBLEMS (pain or burning when urinating, or frequent urination) *BOWEL PROBLEMS (unusual diarrhea, constipation, pain near the anus) TENDERNESS IN MOUTH AND THROAT WITH OR WITHOUT PRESENCE OF ULCERS (sore throat, sores in mouth, or a toothache) UNUSUAL RASH, SWELLING OR PAIN  UNUSUAL VAGINAL DISCHARGE OR ITCHING   Items with * indicate a potential emergency and should be followed up as soon as possible or go to the Emergency Department if any problems should occur.  Please show the CHEMOTHERAPY ALERT CARD or IMMUNOTHERAPY ALERT CARD at  check-in to the Emergency Department and triage nurse.  Should you have questions after your visit or need to cancel or reschedule your appointment, please contact [facility]  Dept: [phone]  and follow the prompts.  Office hours are 8:00 a.m. to 4:30 p.m. Monday - Friday. Please note that voicemails left after 4:00 p.m. may not be returned until the following business day.  We are closed weekends and major holidays. You have access to a nurse at all times for urgent questions. Please call the main number to the clinic Dept: [phone] and follow the prompts.   For any non-urgent questions, you may also contact your provider using MyChart. We now offer e-Visits for anyone 18 and older to request care online for non-urgent symptoms. For details visit [email or web address].   Also download the MyChart app! Go to the app store, search "MyChart", open the app, select , and log in with your MyChart username and password.   

## 2022-11-20 NOTE — Progress Notes (Signed)
[facility] [phone]   [phone]  OFFICE PROGRESS NOTE  [name], MD 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 Dorris Fetch on January 28, 2022.    CURRENT THERAPY:  1) Starting systemic therapy with Revlimid 21 days on 7 days off, Darzalex, and 20 mg of Decadron weekly.  First dose expected next week on 09/11/2022.  Status post 2 cycles.  2) Monthly Zometa injections for 6 to 12 months, followed by every 3 months thereon after once dental clearance is obtained.  INTERVAL HISTORY: [name] 82 y.o. female returns to the clinic today for follow-up visit accompanied by her son.  The patient is feeling fine today with no concerning complaints except for the generalized fatigue and weakness.  She had 1 episode of nausea yesterday.  She denied having any current chest pain, shortness of breath, cough or hemoptysis.  She has no nausea, vomiting, diarrhea or constipation.  She has no fever or chills.  She is here today for evaluation before starting day 15 of cycle #3.   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 by echo 10/2021   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/2015   Carotid artery stenosis    < 50% stenosis bilaterally by doppler 07/2016   Coronary artery disease 2008   Coronary Ca score of 331 with minimal multivessel plaque < 25% stenosis by  coronary CTA 8/23   Heart murmur    per pt   Hypercholesteremia    Hypertension    Obesity    Osteopenia    declines treatment   Pneumonia 1995   Pulmonary HTN (HCC)    mild to moderate by echo 7/23 with PASP   Shoulder pain    Due to arthritis   Vitamin D insufficiency     ALLERGIES:  is allergic to crestor [rosuvastatin calcium], lactose, lipitor [atorvastatin], pravastatin, simvastatin, tramadol, zetia [ezetimibe], codeine, penicillins, sulfa antibiotics, and vancomycin.  MEDICATIONS:  Current Outpatient Medications  Medication Sig Dispense Refill   omeprazole (PRILOSEC) 20 MG capsule Take 1 capsule (20 mg total) by mouth daily. 30 capsule 3   acetaminophen (TYLENOL) 325 MG tablet Take 2 tablets (650 mg total) by mouth every 8 (eight) hours as needed.     acyclovir (ZOVIRAX) 200 MG capsule Take 200 mg by mouth 2 (two) times daily.     allopurinol (ZYLOPRIM) 100 MG tablet Take 1 tablet (100 mg total) by mouth 2 (two) times daily. 60 tablet 2   ascorbic acid (VITAMIN C) 500 MG tablet Take 1 tablet (500 mg total) by mouth daily.     daratumumab-hyaluronidase-fihj (DARZALEX FASPRO) 1800-30000 MG-UT/15ML SOLN Inject 1,800 mg into the skin once.     dexamethasone (DECADRON) 2 MG tablet Take 2 mg by mouth daily. For pain management - prescribed at  hospital discharge on 10/02/22 and refilled 11/06/22.     dexamethasone (DECADRON) 4 MG tablet Please take 5 tablets (20 mg) weekly on the days of treatment 40 tablet 3   fluconazole (DIFLUCAN) 150 MG tablet Take 150 mg by mouth once a week. Once a week- for rash. Not completely sure of dose     gentamicin ointment (GARAMYCIN) 0.1 % Apply 1 Application topically daily.     H-CHLOR 12 0.125 % SOLN      HYDROcodone-acetaminophen (NORCO/VICODIN) 5-325 MG tablet Take 1 tablet by mouth every 6 (six) hours as needed for moderate pain.     lenalidomide (REVLIMID) 15 MG capsule Take 1 capsule (15 mg total) by mouth daily. Siri Cole # [id number]      Date Obtained 10/30/2022 Adult female 21 capsule 0   magic mouthwash SOLN Take 5 mLs by mouth 4 (four) times daily. Swish and spit 240 mL 0   Multiple Vitamin (MULTIVITAMIN WITH MINERALS) TABS tablet Take 1 tablet by mouth daily.     multivitamin (RENA-VIT) TABS tablet Take 1 tablet by mouth at bedtime. (Patient not taking: Reported on 10/09/2022)  0   naloxone (NARCAN) nasal spray 4 mg/0.1 mL      nystatin cream (MYCOSTATIN) Apply 1 Application topically 2 (two) times daily.     oxycodone (OXY-IR) 5 MG capsule Take 5 mg by mouth every 6 (six) hours.     sodium hypochlorite (DAKIN'S 1/4 STRENGTH) 0.125 % SOLN 1 application Externally Once a day for 30 days     No current facility-administered medications for this visit.    SURGICAL HISTORY:  Past Surgical History:  Procedure Laterality Date   AORTIC VALVE REPLACEMENT N/A 10/14/2013   Procedure: AORTIC VALVE REPLACEMENT (AVR);  Surgeon: Alleen Borne, MD;  Location: [facility] OR;  Service: Open Heart Surgery;  Laterality: N/A;   CARDIAC CATHETERIZATION     CATARACT EXTRACTION, BILATERAL     CHOLECYSTECTOMY  04/08/1988   ESOPHAGOGASTRODUODENOSCOPY (EGD) WITH PROPOFOL N/A 09/05/2022   Procedure: ESOPHAGOGASTRODUODENOSCOPY (EGD) WITH PROPOFOL;  Surgeon: Kathi Der, MD;  Location: [facility] ENDOSCOPY;  Service: Gastroenterology;  Laterality: N/A;   INTRAOPERATIVE TRANSESOPHAGEAL ECHOCARDIOGRAM N/A 10/14/2013   Procedure: INTRAOPERATIVE TRANSESOPHAGEAL ECHOCARDIOGRAM;  Surgeon: Alleen Borne, MD;  Location: [facility] OR;  Service: Open Heart Surgery;  Laterality: N/A;   IVC FILTER INSERTION N/A 09/06/2022   Procedure: IVC FILTER INSERTION;  Surgeon: Leonie Douglas, MD;  Location: [facility] INVASIVE CV LAB;  Service: Cardiovascular;  Laterality: N/A;   KYPHOPLASTY Bilateral 03/27/2022   Procedure: KYPHOPLASTY AND BIOPSY THORACIC ELEVEN;  Surgeon: Lisbeth Renshaw, MD;  Location: [facility] OR;  Service: Neurosurgery;  Laterality: Bilateral;   LEFT AND RIGHT HEART  CATHETERIZATION WITH CORONARY ANGIOGRAM N/A 09/16/2013   Procedure: LEFT AND RIGHT HEART CATHETERIZATION WITH CORONARY ANGIOGRAM;  Surgeon: Quintella Reichert, MD;  Location: [facility] CATH LAB;  Service: Cardiovascular;  Laterality: N/A;   TONSILLECTOMY      REVIEW OF SYSTEMS:  A comprehensive review of systems was negative except for: Constitutional: positive for fatigue Musculoskeletal: positive for muscle weakness   PHYSICAL EXAMINATION: General appearance: alert, cooperative, fatigued, and no distress Head: Normocephalic, without obvious abnormality, atraumatic Neck: no adenopathy, no JVD, supple, symmetrical, trachea midline, and thyroid not enlarged, symmetric, no tenderness/mass/nodules Lymph nodes: Cervical, supraclavicular, and axillary nodes normal. Resp: clear to auscultation bilaterally Back: symmetric, no curvature. ROM normal. No CVA tenderness. Cardio: regular rate and rhythm, S1, S2 normal, no murmur, click, rub or gallop GI: soft, non-tender; bowel sounds  normal; no masses,  no organomegaly Extremities: extremities normal, atraumatic, no cyanosis or edema  ECOG PERFORMANCE STATUS: 1 - Symptomatic but completely ambulatory  Blood pressure (!) 150/58, pulse 70, temperature 97.7 F (36.5 C), temperature source Oral, resp. rate 16, height 5\' 1"  (1.549 m), weight 141 lb 12.8 oz (64.3 kg), SpO2 98%.  LABORATORY DATA: Lab Results  Component Value Date   WBC 9.5 11/06/2022   HGB 10.6 (L) 11/06/2022   HCT 32.4 (L) 11/06/2022   MCV 97.0 11/06/2022   PLT 261 11/06/2022      Chemistry      Component Value Date/Time   NA 141 11/06/2022 0937   NA 141 10/06/2020 0939   K 3.8 11/06/2022 0937   CL 105 11/06/2022 0937   CO2 28 11/06/2022 0937   BUN 28 (H) 11/06/2022 0937   BUN 17 10/06/2020 0939   CREATININE 0.69 11/06/2022 0937   CREATININE 0.85 01/29/2016 0910      Component Value Date/Time   CALCIUM 9.0 11/06/2022 0937   ALKPHOS 228 (H) 11/06/2022 0937   AST 17 11/06/2022  0937   ALT 40 11/06/2022 0937   BILITOT 0.3 11/06/2022 0937       RADIOGRAPHIC STUDIES: No results found.  ASSESSMENT AND PLAN: This is a very pleasant 82 years old white female with  1) history of stage I (T1a, N0, M0) thymoma type AB diagnosed in October 2023 status post resection under the care of Dr. Dorris Fetch on January 28, 2022 with close resection margin and microscopic infiltration of the capsule. 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  The patient is undergoing systemic therapy with daratumumab, Revlimid 15 mg p.o. daily for 21 days every 4 weeks in addition to Decadron 20 mg weekly with the treatment.  Status post 2 cycles.   She has been tolerating her treatment well with no concerning adverse effect except for the baseline fatigue and weakness. I recommended for her to proceed with day 15 of cycle #3 today as planned. She had repeat myeloma panel performed earlier today but the results are still pending. For the anemia, she has improvement of her hemoglobin and hematocrit.  There is no need for transfusion today. For the malnutrition, she will continue with the nutritional supplements. I will see her back for follow-up visit in 2 weeks for evaluation before starting day 1 of cycle #4. The patient was advised to call immediately if she has any other concerning symptoms in the interval. The patient voices understanding of current disease status and treatment options and is in agreement with the current care plan.  All questions were answered. The patient knows to call the clinic with any problems, questions or concerns. We can certainly see the patient much sooner if necessary.  The total time spent in the appointment was 20 minutes.  Disclaimer: This note was dictated with voice recognition software. Similar sounding words can inadvertently be transcribed and may not be corrected upon review.

## 2022-11-21 DIAGNOSIS — D630 Anemia in neoplastic disease: Secondary | ICD-10-CM | POA: Diagnosis not present

## 2022-11-21 DIAGNOSIS — L89154 Pressure ulcer of sacral region, stage 4: Secondary | ICD-10-CM | POA: Diagnosis not present

## 2022-11-21 DIAGNOSIS — I824Z2 Acute embolism and thrombosis of unspecified deep veins of left distal lower extremity: Secondary | ICD-10-CM | POA: Diagnosis not present

## 2022-11-21 DIAGNOSIS — E43 Unspecified severe protein-calorie malnutrition: Secondary | ICD-10-CM | POA: Diagnosis not present

## 2022-11-21 DIAGNOSIS — G893 Neoplasm related pain (acute) (chronic): Secondary | ICD-10-CM | POA: Diagnosis not present

## 2022-11-21 DIAGNOSIS — C9000 Multiple myeloma not having achieved remission: Secondary | ICD-10-CM | POA: Diagnosis not present

## 2022-11-21 DIAGNOSIS — C7951 Secondary malignant neoplasm of bone: Secondary | ICD-10-CM | POA: Diagnosis not present

## 2022-11-21 DIAGNOSIS — I129 Hypertensive chronic kidney disease with stage 1 through stage 4 chronic kidney disease, or unspecified chronic kidney disease: Secondary | ICD-10-CM | POA: Diagnosis not present

## 2022-11-21 DIAGNOSIS — I272 Pulmonary hypertension, unspecified: Secondary | ICD-10-CM | POA: Diagnosis not present

## 2022-11-21 LAB — IGG, IGA, IGM
IgA: 44 mg/dL — ABNORMAL LOW (ref 64–422)
IgG (Immunoglobin G), Serum: 337 mg/dL — ABNORMAL LOW (ref 586–1602)
IgM (Immunoglobulin M), Srm: 16 mg/dL — ABNORMAL LOW (ref 26–217)

## 2022-11-21 LAB — BETA 2 MICROGLOBULIN, SERUM: Beta-2 Microglobulin: 3 mg/L — ABNORMAL HIGH (ref 0.6–2.4)

## 2022-11-21 LAB — KAPPA/LAMBDA LIGHT CHAINS
Kappa free light chain: 5.5 mg/L (ref 3.3–19.4)
Kappa, lambda light chain ratio: 0.72 (ref 0.26–1.65)
Lambda free light chains: 7.6 mg/L (ref 5.7–26.3)

## 2022-11-22 ENCOUNTER — Telehealth: Payer: Self-pay | Admitting: Medical Oncology

## 2022-11-22 DIAGNOSIS — L89154 Pressure ulcer of sacral region, stage 4: Secondary | ICD-10-CM | POA: Diagnosis not present

## 2022-11-22 DIAGNOSIS — D630 Anemia in neoplastic disease: Secondary | ICD-10-CM | POA: Diagnosis not present

## 2022-11-22 DIAGNOSIS — I129 Hypertensive chronic kidney disease with stage 1 through stage 4 chronic kidney disease, or unspecified chronic kidney disease: Secondary | ICD-10-CM | POA: Diagnosis not present

## 2022-11-22 DIAGNOSIS — C7951 Secondary malignant neoplasm of bone: Secondary | ICD-10-CM | POA: Diagnosis not present

## 2022-11-22 DIAGNOSIS — I272 Pulmonary hypertension, unspecified: Secondary | ICD-10-CM | POA: Diagnosis not present

## 2022-11-22 DIAGNOSIS — E43 Unspecified severe protein-calorie malnutrition: Secondary | ICD-10-CM | POA: Diagnosis not present

## 2022-11-22 DIAGNOSIS — I824Z2 Acute embolism and thrombosis of unspecified deep veins of left distal lower extremity: Secondary | ICD-10-CM | POA: Diagnosis not present

## 2022-11-22 DIAGNOSIS — G893 Neoplasm related pain (acute) (chronic): Secondary | ICD-10-CM | POA: Diagnosis not present

## 2022-11-22 DIAGNOSIS — C9000 Multiple myeloma not having achieved remission: Secondary | ICD-10-CM | POA: Diagnosis not present

## 2022-11-22 NOTE — Telephone Encounter (Signed)
[name] -MM lab results requested. Dtr told him her results are better. Does she continue tx?  I told [name] that [name]'s MM labs are improved and she needs to stay the course with tx -next 08/28.

## 2022-11-25 ENCOUNTER — Encounter: Payer: Medicare PPO | Admitting: Physician Assistant

## 2022-11-25 ENCOUNTER — Other Ambulatory Visit: Payer: Self-pay | Admitting: Internal Medicine

## 2022-11-25 DIAGNOSIS — L89154 Pressure ulcer of sacral region, stage 4: Secondary | ICD-10-CM | POA: Diagnosis not present

## 2022-11-25 DIAGNOSIS — C9000 Multiple myeloma not having achieved remission: Secondary | ICD-10-CM

## 2022-11-25 DIAGNOSIS — I824Z2 Acute embolism and thrombosis of unspecified deep veins of left distal lower extremity: Secondary | ICD-10-CM | POA: Diagnosis not present

## 2022-11-25 DIAGNOSIS — D630 Anemia in neoplastic disease: Secondary | ICD-10-CM | POA: Diagnosis not present

## 2022-11-25 DIAGNOSIS — M6281 Muscle weakness (generalized): Secondary | ICD-10-CM | POA: Diagnosis not present

## 2022-11-25 DIAGNOSIS — I10 Essential (primary) hypertension: Secondary | ICD-10-CM | POA: Diagnosis not present

## 2022-11-25 DIAGNOSIS — E43 Unspecified severe protein-calorie malnutrition: Secondary | ICD-10-CM | POA: Diagnosis not present

## 2022-11-25 DIAGNOSIS — G893 Neoplasm related pain (acute) (chronic): Secondary | ICD-10-CM | POA: Diagnosis not present

## 2022-11-25 DIAGNOSIS — I129 Hypertensive chronic kidney disease with stage 1 through stage 4 chronic kidney disease, or unspecified chronic kidney disease: Secondary | ICD-10-CM | POA: Diagnosis not present

## 2022-11-25 DIAGNOSIS — I272 Pulmonary hypertension, unspecified: Secondary | ICD-10-CM | POA: Diagnosis not present

## 2022-11-25 DIAGNOSIS — C7951 Secondary malignant neoplasm of bone: Secondary | ICD-10-CM | POA: Diagnosis not present

## 2022-11-25 DIAGNOSIS — Z9221 Personal history of antineoplastic chemotherapy: Secondary | ICD-10-CM | POA: Diagnosis not present

## 2022-11-25 NOTE — Progress Notes (Addendum)
[name], [name] ([id number]) [id number] Page 1 of 7 Visit Report for 11/25/2022 Chief Complaint Document Details Patient Name: Date of Service: [name], [name] [name] 11/25/2022 12:15 PM Medical Record Number: [id number] Patient Account Number: [id number] Date of Birth/Sex: Treating RN: 1940-10-11 (82 y.o. [name] Primary Care Provider: Hillard [name] Other Clinician: Referring Provider: Treating Provider/Extender: [name] Weeks in Treatment: 5 Information Obtained from: Patient Chief Complaint Stage 4 pressure ulcer Electronic Signature(s) Signed: 11/25/2022 12:52:33 PM By: Allen Derry PA-C Entered By: Allen Derry on 11/25/2022 09:52:33 -------------------------------------------------------------------------------- HPI Details Patient Name: Date of Service: [name] 11/25/2022 12:15 PM Medical Record Number: [id number] Patient Account Number: [id number] Date of Birth/Sex: Treating RN: [birth year] (82 y.o. 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 Currently there is not direct bone exposure because she is now covered with granulation tissue her family has been taking care of her at home at this point although she was previously in a facility. This is something that was noted when she ended up having to go to the ER initially on Aug 30, 2022. Subsequently during hospital stent it was started that she had gentamicin applied topically along with Dakin's moistened gauze packing which has been [name] at home now and seems to be doing very well. She was at [facility] skilled nursing  facility for time. She did have Levaquin previous which she did do very well for her as far as get the infection under control in my opinion I think she is actually seeming to do quite well at this point. She is not having any pain which is also excellent news at this time. She is starting to get around more so she is not completely bedbound although she is a little weak she does have family that is very attentive and taking great care of her. 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 There is an area of bone that is very thinly covered. Been using Dakin's wet-to-dry dressings. She has in-home caregivers 24/7 and also home health coming out to change dressings/participate in dressing supervision although most of her dressing changes are being [name] by her own in-home caregivers. 11-04-2022 upon evaluation today patient appears to be doing well currently in regard to her wound. With that being said I do feel like she is making really good progress towards healing currently. I do not see any evidence of active infection locally nor systemically which is excellent news as well. No fevers, chills, [name], [name] ([id number]) [id number] Page 2 of 7 nausea, vomiting, or diarrhea. 11-12-2022 patient's wound today appears to be doing okay although to be honest I am a little concerned about the fact that the wound VAC was not put on properly. It was not bridged she was laying on the tubing and this course was not very comfortable for her. Fortunately I do not see any signs of active infection locally or systemically which is great news. No fevers, chills, nausea, vomiting, or diarrhea. 11-18-2022 upon evaluation today patient appears to be doing well  currently in regard to her wound. This is actually showing  signs of improvement wound VAC was put on much more eloquently this time compared to last I am actually very pleased. He does not like there may be some irritation around the edges of the wound I think this may be subsequent to an issue going on with infection. We can look into trying to determine what this may be and then adjusting medications as needed to take care of that infection if so. 11-25-2022 upon evaluation today patient appears to be doing well currently in regard to her wound which is actually showing signs of improvement feeling little by little. The irritation around the edges of the wound is better and the wound VAC seems to have been doing well until he ran out of the DuoDERM and apparently last time this was put on the DuoDERM was not utilized. Again I think that is something that has been extremely beneficial for keeping this wound VAC in place without any complications in the past. Electronic Signature(s) Signed: 11/25/2022 1:40:23 PM By: Allen Derry PA-C Entered By: Allen Derry on 11/25/2022 10:40:22 -------------------------------------------------------------------------------- Physical Exam Details Patient Name: Date of Service: [name], [name] 11/25/2022 12:15 PM Medical Record Number: [id number] Patient Account Number: [id number] Date of Birth/Sex: Treating RN: 1940-08-27 (82 y.o. [name] Primary Care Provider: Hillard [name] Other Clinician: Referring Provider: Treating Provider/Extender: [name] Weeks in Treatment: 5 Constitutional Well-nourished and well-hydrated in no acute distress. Respiratory normal breathing without difficulty. Psychiatric this patient is able to make decisions and demonstrates good insight into disease process. Alert and Oriented x 3. pleasant and cooperative. Notes Upon inspection patient's wound bed actually showed signs of good granulation and epithelization at this point. Fortunately I do not see any signs of  active infection locally or systemically which is great news. The wound VAC has been doing quite well until just recent. It was on Saturday it began to weep and it was due to leaking. And this according to the aide seems to have been right around the perianal region which is where we been using the DuoDERM but they did not have it used last time. Electronic Signature(s) Signed: 11/25/2022 1:40:55 PM By: Allen Derry PA-C Entered By: Allen Derry on 11/25/2022 10:40:55 Physician Orders Details -------------------------------------------------------------------------------- Shaaron Adler ([id number]) [id number] Page 3 of 7 Patient Name: Date of Service: [name], [name] [name] 11/25/2022 12:15 PM Medical Record Number: [id number] Patient Account Number: [id number] Date of Birth/Sex: Treating RN: 1940-04-22 (82 y.o. [name] Primary Care Provider: Hillard [name] Other Clinician: Referring Provider: Treating Provider/Extender: [name] Weeks in Treatment: 5 Verbal / Phone Orders: No Diagnosis Coding ICD-10 Coding Code Description L89.154 Pressure ulcer of sacral region, stage 4 M62.81 Muscle weakness (generalized) C90.00 Multiple myeloma not having achieved remission Follow-up Appointments Return Appointment in 1 week. Home Health [facility] for wound care. May utilize formulary equivalent dressing for wound treatment orders unless otherwise specified. Home Health Nurse may visit PRN to address patients wound care needs. - [facility]--Remove dressing and clean the wound with normal saline, use skin barrier wipes prior to apply the drape. Apply wound vac foam and use the Duoderm (may cut in a U shape). Please use drape to outline the wound prior to placing the foam dressing in the wound. Please bridge the vac from the wound up to the hip to prevent lying on the tubing. Must use foam over the drape to bridge. Please see  photo sent with patient  and link for video. [email or web address] Change MWF. Patient will come to wound center on Monday. We will remove vac and HH RN will need to come in the afternoon to replace the vac. Please call if you have any questions. Patient must be lying in bed. Bathing/ Shower/ Hygiene May shower; gently cleanse wound with antibacterial soap, rinse and pat dry prior to dressing wounds Off-Loading Low air-loss mattress (Group 2) Turn and reposition every 2 hours Negative Pressure Wound Therapy Other: - NPWT to be applied on Monday 11/04/22 by [facility] nurse Wound Treatment Wound #1 - Coccyx Wound Laterality: Midline Cleanser: Dakin 16 (oz) 0.25 (Home Health) 1 x Per Day/30 Days Discharge Instructions: Moisten gauze and place on the wound Cleanser: Normal Saline (Home Health) 1 x Per Day/30 Days Discharge Instructions: Wash your hands with soap and water. Remove old dressing, discard into plastic bag and place into trash. Cleanse the wound with Normal Saline prior to applying a clean dressing using gauze sponges, not tissues or cotton balls. Do not scrub or use excessive force. Pat dry using gauze sponges, not tissue or cotton balls. Prim Dressing: Gauze (Home Health) 1 x Per Day/30 Days ary Discharge Instructions: As directed: dry, moistened with saline or moistened with Dakins Solution Secondary Dressing: ABD Pad 5x9 (in/in) (Home Health) 1 x Per Day/30 Days Discharge Instructions: Cover with ABD pad Secured With: Medipore T - 7M Medipore H Soft Cloth Surgical T ape ape, 2x2 (in/yd) (Home Health) 1 x Per Day/30 Days Electronic Signature(s) Signed: 11/25/2022 5:35:17 PM By: Allen Derry PA-C Signed: 11/29/2022 1:24:46 PM By: Midge Aver MSN RN CNS WTA Entered By: Midge Aver on 11/25/2022 10:08:27 [name], [name] ([id number]) [id number] Page 4 of 7 -------------------------------------------------------------------------------- Problem List  Details Patient Name: Date of Service: [name], [name] [name] 11/25/2022 12:15 PM Medical Record Number: [id number] Patient Account Number: [id number] Date of Birth/Sex: Treating RN: 04/24/1940 (82 y.o. [name] Primary Care Provider: Hillard [name] Other Clinician: Referring Provider: Treating Provider/Extender: [name] Weeks in Treatment: 5 Active Problems ICD-10 Encounter Code Description Active Date MDM Diagnosis L89.154 Pressure ulcer of sacral region, stage 4 10/21/2022 No Yes M62.81 Muscle weakness (generalized) 10/21/2022 No Yes C90.00 Multiple myeloma not having achieved remission 10/21/2022 No Yes Inactive Problems Resolved Problems Electronic Signature(s) Signed: 11/25/2022 5:35:17 PM By: Allen Derry PA-C Signed: 11/29/2022 1:24:46 PM By: Midge Aver MSN RN CNS WTA Previous Signature: 11/25/2022 12:52:29 PM Version By: Allen Derry PA-C Entered By: Midge Aver on 11/25/2022 10:10:23 -------------------------------------------------------------------------------- Progress Note Details Patient Name: Date of Service: [name] 11/25/2022 12:15 PM Medical Record Number: [id number] Patient Account Number: [id number] Date of Birth/Sex: Treating RN: Jun 15, 1940 (82 y.o. 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 Currently there is not direct bone exposure because she is now covered with granulation tissue her family has been taking care of her at home at this point although she was previously in a facility. This is something  that was noted when she ended up having to go to the ER initially on Aug 30, 2022. Subsequently during hospital stent it was started that  she had gentamicin applied topically along with Dakin's moistened gauze packing which has been [name] at home now and seems to be doing very well. She was at [facility] skilled nursing facility for time. She did have Levaquin previous which she did do very well for her as far as get the infection under control in my opinion I think she is actually seeming to do quite well at this point. She is not having any pain which is also excellent news at this time. She is starting to get around more so she is not completely bedbound although she is a little weak she does have family that is very attentive and taking great care of her. [name], [name] ([id number]) [id number] Page 5 of 7 Patient does have multiple myeloma and generalized muscle weakness but otherwise no major medical problems currently. 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 There is an area of bone that is very thinly covered. Been using Dakin's wet-to-dry dressings. She has in-home caregivers 24/7 and also home health coming out to change dressings/participate in dressing supervision although most of her dressing changes are being [name] by her own in-home caregivers. 11-04-2022 upon evaluation today patient appears to be doing well currently in regard to her wound. With that being said I do feel like she is making really good progress towards healing currently. I do not see any evidence of active infection locally nor systemically which is excellent news as well. No fevers, chills, nausea, vomiting, or diarrhea. 11-12-2022 patient's wound today appears to be doing okay although to be honest I am a little concerned about the fact that the wound VAC was not put on properly. It was not bridged she was laying on the tubing and  this course was not very comfortable for her. Fortunately I do not see any signs of active infection locally or systemically which is great news. No fevers, chills, nausea, vomiting, or diarrhea. 11-18-2022 upon evaluation today patient appears to be doing well currently in regard to her wound. This is actually showing signs of improvement wound VAC was put on much more eloquently this time compared to last I am actually very pleased. He does not like there may be some irritation around the edges of the wound I think this may be subsequent to an issue going on with infection. We can look into trying to determine what this may be and then adjusting medications as needed to take care of that infection if so. 11-25-2022 upon evaluation today patient appears to be doing well currently in regard to her wound which is actually showing signs of improvement feeling little by little. The irritation around the edges of the wound is better and the wound VAC seems to have been doing well until he ran out of the DuoDERM and apparently last time this was put on the DuoDERM was not utilized. Again I think that is something that has been extremely beneficial for keeping this wound VAC in place without any complications in the past. Objective Constitutional Well-nourished and well-hydrated in no acute distress. Vitals Time Taken: 12:45 PM, Temperature: 97.7 F, Pulse: 72 bpm, Respiratory Rate: 18 breaths/min, Blood Pressure: 149/107 mmHg. Respiratory normal breathing without difficulty. Psychiatric this patient is able to make decisions and demonstrates good insight into disease process. Alert and Oriented x 3. pleasant and cooperative. General Notes: Upon inspection patient's wound bed actually showed signs of good granulation and epithelization at this point.  Fortunately I do not see any signs of active infection locally or systemically which is great news. The wound VAC has been doing quite well until just recent.  It was on Saturday it began to weep and it was due to leaking. And this according to the aide seems to have been right around the perianal region which is where we been using the DuoDERM but they did not have it used last time. Integumentary (Hair, Skin) Wound #1 status is Open. Original cause of wound was Pressure Injury. The date acquired was: 08/30/2022. The wound has been in treatment 5 weeks. The wound is located on the Midline Coccyx. The wound measures 7cm length x 4.7cm width x 1.4cm depth; 25.84cm^2 area and 36.175cm^3 volume. There is bone and Fat Layer (Subcutaneous Tissue) exposed. There is tunneling at 12:00 with a maximum distance of 1.5cm. There is a medium amount of serosanguineous drainage noted. There is medium (34-66%) red, pink granulation within the wound bed. There is no necrotic tissue within the wound bed. Assessment Active Problems ICD-10 Pressure ulcer of sacral region, stage 4 Muscle weakness (generalized) Multiple myeloma not having achieved remission Plan Follow-up Appointments: Return Appointment in 1 week. Home Health: [facility] for wound care. May utilize formulary equivalent dressing for wound treatment orders unless otherwise specified. Home Health Nurse may visit PRN to address patients wound care needs. - [facility]--Remove dressing and clean the wound with normal saline, use skin barrier wipes prior to apply the drape. Apply wound vac foam and use the Duoderm (may cut in a U shape). Please use drape to outline the wound prior to placing the foam dressing in the wound. Please bridge the vac from the wound up to the hip to prevent lying on the tubing. Must use foam over the drape to bridge. Please see photo sent with patient and link for video. [email or web address] Change MWF. Patient will come to wound center on Monday. We will remove vac and HH RN will need to come in the afternoon to replace the vac. Please call if you have  any questions. Patient must be lying in bed. Bathing/ Shower/ Hygiene: [name], [name] ([id number]) [id number] Page 6 of 7 May shower; gently cleanse wound with antibacterial soap, rinse and pat dry prior to dressing wounds Off-Loading: Low air-loss mattress (Group 2) Turn and reposition every 2 hours Negative Pressure Wound Therapy: Other: - NPWT to be applied on Monday 11/04/22 by [facility] nurse WOUND #1: - Coccyx Wound Laterality: Midline Cleanser: Dakin 16 (oz) 0.25 (Home Health) 1 x Per Day/30 Days Discharge Instructions: Moisten gauze and place on the wound Cleanser: Normal Saline (Home Health) 1 x Per Day/30 Days Discharge Instructions: Wash your hands with soap and water. Remove old dressing, discard into plastic bag and place into trash. Cleanse the wound with Normal Saline prior to applying a clean dressing using gauze sponges, not tissues or cotton balls. Do not scrub or use excessive force. Pat dry using gauze sponges, not tissue or cotton balls. Prim Dressing: Gauze (Home Health) 1 x Per Day/30 Days ary Discharge Instructions: As directed: dry, moistened with saline or moistened with Dakins Solution Secondary Dressing: ABD Pad 5x9 (in/in) (Home Health) 1 x Per Day/30 Days Discharge Instructions: Cover with ABD pad Secured With: Medipore T - 73M Medipore H Soft Cloth Surgical T ape ape, 2x2 (in/yd) (Home Health) 1 x Per Day/30 Days 1. I am going to recommend that we have the patient continue to monitor for any signs of infection or  worsening. Based on what I am seeing I do believe they are making headway towards complete closure. 2. I am going to recommend that we continue with the ABD pad followed by the Dakin's moistened gauze dressing until she can get home at which point they are supposed to be putting the wound VAC on in the next hour. 3. That should be using the DuoDERM at the bottom to seal and again that is something that they stated they had  in the office and will be bringing this time I think that is what kept her from maintaining seal last time of the perianal region. We will see patient back for reevaluation in 1 week here in the clinic. If anything worsens or changes patient will contact our office for additional recommendations. Electronic Signature(s) Signed: 11/25/2022 1:42:06 PM By: Allen Derry PA-C Entered By: Allen Derry on 11/25/2022 10:42:05 -------------------------------------------------------------------------------- SuperBill Details Patient Name: Date of Service: [name] 11/25/2022 Medical Record Number: [id number] Patient Account Number: [id number] Date of Birth/Sex: Treating RN: 29-Jul-1940 (82 y.o. [name] Primary Care Provider: Hillard [name] Other Clinician: Referring Provider: Treating Provider/Extender: [name] Weeks in Treatment: 5 Diagnosis Coding ICD-10 Codes Code Description [phone] Pressure ulcer of sacral region, stage 4 M62.81 Muscle weakness (generalized) C90.00 Multiple myeloma not having achieved remission Facility Procedures : 7 CPT4 Code: 4696295 Description: 99213 - WOUND CARE VISIT-LEV 3 EST PT Modifier: Quantity: 1 Physician Procedures : CPT4 Code Description Modifier [id number] 99214 - WC PHYS LEVEL 4 - EST PT ICD-10 Diagnosis Description L89.154 Pressure ulcer of sacral region, stage 4 M62.81 Muscle weakness (generalized) [name], [name] ([id number]) [id number]  Page 7 of 7 C90.00 Multiple myeloma not having achieved remission Quantity: 1 Electronic Signature(s) Signed: 11/25/2022 1:47:47 PM By: Allen Derry PA-C Entered By: Allen Derry on 11/25/2022 10:47:47

## 2022-11-27 ENCOUNTER — Other Ambulatory Visit: Payer: Self-pay | Admitting: Medical Oncology

## 2022-11-27 DIAGNOSIS — L89159 Pressure ulcer of sacral region, unspecified stage: Secondary | ICD-10-CM | POA: Diagnosis not present

## 2022-11-27 DIAGNOSIS — G894 Chronic pain syndrome: Secondary | ICD-10-CM | POA: Diagnosis not present

## 2022-11-27 DIAGNOSIS — D630 Anemia in neoplastic disease: Secondary | ICD-10-CM | POA: Diagnosis not present

## 2022-11-27 DIAGNOSIS — C9000 Multiple myeloma not having achieved remission: Secondary | ICD-10-CM

## 2022-11-27 DIAGNOSIS — L89154 Pressure ulcer of sacral region, stage 4: Secondary | ICD-10-CM | POA: Diagnosis not present

## 2022-11-27 DIAGNOSIS — G893 Neoplasm related pain (acute) (chronic): Secondary | ICD-10-CM | POA: Diagnosis not present

## 2022-11-27 DIAGNOSIS — I824Z2 Acute embolism and thrombosis of unspecified deep veins of left distal lower extremity: Secondary | ICD-10-CM | POA: Diagnosis not present

## 2022-11-27 DIAGNOSIS — C7951 Secondary malignant neoplasm of bone: Secondary | ICD-10-CM | POA: Diagnosis not present

## 2022-11-27 DIAGNOSIS — M8448XD Pathological fracture, other site, subsequent encounter for fracture with routine healing: Secondary | ICD-10-CM | POA: Diagnosis not present

## 2022-11-27 DIAGNOSIS — E43 Unspecified severe protein-calorie malnutrition: Secondary | ICD-10-CM | POA: Diagnosis not present

## 2022-11-27 DIAGNOSIS — I129 Hypertensive chronic kidney disease with stage 1 through stage 4 chronic kidney disease, or unspecified chronic kidney disease: Secondary | ICD-10-CM | POA: Diagnosis not present

## 2022-11-27 DIAGNOSIS — I272 Pulmonary hypertension, unspecified: Secondary | ICD-10-CM | POA: Diagnosis not present

## 2022-11-27 NOTE — Telephone Encounter (Signed)
I called revlimid auth to [facility].

## 2022-11-28 ENCOUNTER — Telehealth: Payer: Self-pay | Admitting: Medical Oncology

## 2022-11-28 DIAGNOSIS — C7951 Secondary malignant neoplasm of bone: Secondary | ICD-10-CM | POA: Diagnosis not present

## 2022-11-28 DIAGNOSIS — R52 Pain, unspecified: Secondary | ICD-10-CM | POA: Diagnosis not present

## 2022-11-28 DIAGNOSIS — L89154 Pressure ulcer of sacral region, stage 4: Secondary | ICD-10-CM | POA: Diagnosis not present

## 2022-11-28 DIAGNOSIS — G893 Neoplasm related pain (acute) (chronic): Secondary | ICD-10-CM | POA: Diagnosis not present

## 2022-11-28 DIAGNOSIS — C9000 Multiple myeloma not having achieved remission: Secondary | ICD-10-CM | POA: Diagnosis not present

## 2022-11-28 DIAGNOSIS — I272 Pulmonary hypertension, unspecified: Secondary | ICD-10-CM | POA: Diagnosis not present

## 2022-11-28 DIAGNOSIS — L89159 Pressure ulcer of sacral region, unspecified stage: Secondary | ICD-10-CM | POA: Diagnosis not present

## 2022-11-28 DIAGNOSIS — I824Z2 Acute embolism and thrombosis of unspecified deep veins of left distal lower extremity: Secondary | ICD-10-CM | POA: Diagnosis not present

## 2022-11-28 DIAGNOSIS — R4189 Other symptoms and signs involving cognitive functions and awareness: Secondary | ICD-10-CM | POA: Diagnosis not present

## 2022-11-28 DIAGNOSIS — D630 Anemia in neoplastic disease: Secondary | ICD-10-CM | POA: Diagnosis not present

## 2022-11-28 DIAGNOSIS — E43 Unspecified severe protein-calorie malnutrition: Secondary | ICD-10-CM | POA: Diagnosis not present

## 2022-11-28 DIAGNOSIS — I129 Hypertensive chronic kidney disease with stage 1 through stage 4 chronic kidney disease, or unspecified chronic kidney disease: Secondary | ICD-10-CM | POA: Diagnosis not present

## 2022-11-28 NOTE — Telephone Encounter (Signed)
?  11/27/22 Pharmacy received [facility] Berkley Harvey #=[id number], date 11/28/22-

## 2022-11-29 DIAGNOSIS — L89154 Pressure ulcer of sacral region, stage 4: Secondary | ICD-10-CM | POA: Diagnosis not present

## 2022-11-29 DIAGNOSIS — I272 Pulmonary hypertension, unspecified: Secondary | ICD-10-CM | POA: Diagnosis not present

## 2022-11-29 DIAGNOSIS — I129 Hypertensive chronic kidney disease with stage 1 through stage 4 chronic kidney disease, or unspecified chronic kidney disease: Secondary | ICD-10-CM | POA: Diagnosis not present

## 2022-11-29 DIAGNOSIS — D630 Anemia in neoplastic disease: Secondary | ICD-10-CM | POA: Diagnosis not present

## 2022-11-29 DIAGNOSIS — G893 Neoplasm related pain (acute) (chronic): Secondary | ICD-10-CM | POA: Diagnosis not present

## 2022-11-29 DIAGNOSIS — C7951 Secondary malignant neoplasm of bone: Secondary | ICD-10-CM | POA: Diagnosis not present

## 2022-11-29 DIAGNOSIS — C9000 Multiple myeloma not having achieved remission: Secondary | ICD-10-CM | POA: Diagnosis not present

## 2022-11-29 DIAGNOSIS — I824Z2 Acute embolism and thrombosis of unspecified deep veins of left distal lower extremity: Secondary | ICD-10-CM | POA: Diagnosis not present

## 2022-11-29 DIAGNOSIS — E43 Unspecified severe protein-calorie malnutrition: Secondary | ICD-10-CM | POA: Diagnosis not present

## 2022-11-29 NOTE — Progress Notes (Signed)
GRITZMACHER, [name] [name] ([id number]) [id number] Page 1 of 9 Visit Report for 11/25/2022 Arrival Information Details Patient Name: Date of Service: [name], [name] [name] 11/25/2022 12:15 PM Medical Record Number: [id number] Patient Account Number: [id number] Date of Birth/Sex: Treating RN: Aug 28, 1940 (82 y.o. [name] Primary Care [name]: [name] Other Clinician: Referring [name]: Treating [name]/Extender: [name]: 5 Visit Information History Since Last Visit Added or deleted any medications: No Patient Arrived: [name] Any new allergies or adverse reactions: No Arrival Time: 12:33 Has Dressing in Place as Prescribed: Yes Accompanied By: daughter in law Pain Present Now: No Transfer Assistance: None Patient Identification Verified: Yes Secondary Verification Process Completed: Yes Patient Requires Transmission-Based Precautions: No Patient Has Alerts: Yes Patient Alerts: Not Diabetic Electronic Signature(s) Signed: 11/29/2022 1:24:46 PM By: [name] Aver MSN RN CNS WTA Entered By: [name] on 11/25/2022 09:45:29 -------------------------------------------------------------------------------- Clinic Level of Care Assessment Details Patient Name: Date of Service: [name], [name] [name] 11/25/2022 12:15 PM Medical Record Number: [id number] Patient Account Number: [id number] Date of Birth/Sex: Treating RN: December 15, 1940 (82 y.o. [name] Primary Care [name]: [name] Other Clinician: Referring [name]: Treating [name]/Extender: [name]: 5 Clinic Level of Care Assessment Items TOOL 4 Quantity Score X- 1 0 Use when only an EandM is performed on FOLLOW-UP visit ASSESSMENTS - Nursing Assessment / Reassessment X- 1 10 Reassessment of Co-morbidities (includes updates in patient status) X- 1 5 Reassessment of Adherence to [name] Plan ASSESSMENTS - Wound and Skin A ssessment /  Reassessment X - Simple Wound Assessment / Reassessment - one wound 1 5 [name], [name] ([id number]) [id number] Page 2 of 9 []  - 0 Complex Wound Assessment / Reassessment - multiple wounds []  - 0 Dermatologic / Skin Assessment (not related to wound area) ASSESSMENTS - Focused Assessment []  - 0 Circumferential Edema Measurements - multi extremities []  - 0 Nutritional Assessment / Counseling / Intervention []  - 0 Lower Extremity Assessment (monofilament, tuning fork, pulses) []  - 0 Peripheral Arterial Disease Assessment (using hand held doppler) ASSESSMENTS - Ostomy and/or Continence Assessment and Care []  - 0 Incontinence Assessment and Management []  - 0 Ostomy Care Assessment and Management (repouching, etc.) PROCESS - Coordination of Care X - Simple Patient / Family Education for ongoing care 1 15 []  - 0 Complex (extensive) Patient / Family Education for ongoing care X- 1 10 Staff obtains Chiropractor, Records, T Results / Process Orders est []  - 0 Staff telephones HHA, Nursing Homes / Clarify orders / etc []  - 0 Routine Transfer to another Facility (non-emergent condition) []  - 0 Routine Hospital Admission (non-emergent condition) []  - 0 New Admissions / Manufacturing engineer / Ordering NPWT Apligraf, etc. , []  - 0 Emergency Hospital Admission (emergent condition) X- 1 10 Simple Discharge Coordination []  - 0 Complex (extensive) Discharge Coordination PROCESS - Special Needs []  - 0 Pediatric / Minor Patient Management []  - 0 Isolation Patient Management []  - 0 Hearing / Language / Visual special needs []  - 0 Assessment of Community assistance (transportation, D/C planning, etc.) []  - 0 Additional assistance / Altered mentation []  - 0 Support Surface(s) Assessment (bed, cushion, seat, etc.) INTERVENTIONS - Wound Cleansing / Measurement X - Simple Wound Cleansing - one wound 1 5 []  - 0 Complex Wound Cleansing - multiple wounds []  -  0 Wound Imaging (photographs - any number of wounds) []  - 0 Wound Tracing (instead of photographs) []  - 0 Simple Wound Measurement - one wound []  -  0 Complex Wound Measurement - multiple wounds INTERVENTIONS - Wound Dressings []  - 0 Small Wound Dressing one or multiple wounds X- 1 15 Medium Wound Dressing one or multiple wounds []  - 0 Large Wound Dressing one or multiple wounds []  - 0 Application of Medications - topical []  - 0 Application of Medications - injection INTERVENTIONS - Miscellaneous []  - 0 External ear exam []  - 0 Specimen Collection (cultures, biopsies, blood, body fluids, etc.) []  - 0 Specimen(s) / Culture(s) sent or taken to Lab for analysis [name] ([id number]) [phone].pdf Page 3 of 9 []  - 0 Patient Transfer (multiple staff / Nurse, adult / Similar devices) []  - 0 Simple Staple / Suture removal (25 or less) []  - 0 Complex Staple / Suture removal (26 or more) []  - 0 Hypo / Hyperglycemic Management (close monitor of Blood Glucose) []  - 0 Ankle / Brachial Index (ABI) - do not check if billed separately X- 1 5 Vital Signs Has the patient been seen at the hospital within the last three years: Yes Total Score: 80 Level Of Care: New/Established - Level 3 Electronic Signature(s) Signed: 11/29/2022 1:24:46 PM By: [name] Aver MSN RN CNS WTA Entered By: [name] on 11/25/2022 10:08:58 -------------------------------------------------------------------------------- Encounter Discharge Information Details Patient Name: Date of Service: [name] Number [name] 11/25/2022 12:15 PM Medical Record Number: [id number] Patient Account Number: [id number] Date of Birth/Sex: Treating RN: 06/15/1940 (82 y.o. [name] Primary Care [name]: [name] Other Clinician: Referring [name]: Treating [name]/Extender: [name]: 5 Encounter Discharge Information Items Discharge Condition: Stable Ambulatory  Status: Walker Discharge Destination: Home Transportation: Private Auto Accompanied By: daughter in law Schedule Follow-up Appointment: Yes Clinical Summary of Care: Electronic Signature(s) Signed: 11/29/2022 1:24:46 PM By: [name] Aver MSN RN CNS WTA Entered By: [name] on 11/25/2022 10:10:59 -------------------------------------------------------------------------------- Lower Extremity Assessment Details Patient Name: Date of Service: [name], [name] [name] 11/25/2022 12:15 PM Medical Record Number: [id number] Patient Account Number: [id number] Date of Birth/Sex: Treating RN: 10-Sep-1940 (82 y.o. [name] Primary Care Esha Fincher: [name] Other Clinician: Pricilla Holm, [name] ([id number]) [phone].pdf Page 4 of 9 Referring Raekwan Spelman: Treating Anaeli Cornwall/Extender: [name]: 5 Electronic Signature(s) Signed: 11/29/2022 1:24:46 PM By: [name] Aver MSN RN CNS WTA Entered By: [name] on 11/25/2022 09:47:30 -------------------------------------------------------------------------------- Multi Wound Chart Details Patient Name: Date of Service: [name] Number [name] 11/25/2022 12:15 PM Medical Record Number: [id number] Patient Account Number: [id number] Date of Birth/Sex: Treating RN: [birth year] (82 y.o. [name] Primary Care Deairra Halleck: [name] Other Clinician: Referring Mirage Pfefferkorn: Treating Dyan Labarbera/Extender: [name]: 5 Vital Signs Height(in): Pulse(bpm): 72 Weight(lbs): Blood Pressure(mmHg): 149/107 Body Mass Index(BMI): Temperature(F): 97.7 Respiratory Rate(breaths/min): 18 [1:Photos:] [N/A:N/A] Midline Coccyx N/A N/A Wound Location: Pressure Injury N/A N/A Wounding Event: Pressure Ulcer N/A N/A Primary Etiology: Hypertension, Received N/A N/A Comorbid History: Chemotherapy 08/30/2022 N/A N/A Date Acquired: 5 N/A N/A Weeks of [name]: Open N/A N/A Wound Status: No N/A  N/A Wound Recurrence: 7x4.7x1.4 N/A N/A Measurements L x W x D (cm) 25.84 N/A N/A A (cm) : rea 36.175 N/A N/A Volume (cm) : 40.50% N/A N/A % Reduction in A rea: 66.70% N/A N/A % Reduction in Volume: 12 Position 1 (o'clock): 1.5 Maximum Distance 1 (cm): Yes N/A N/A Tunneling: Category/Stage IV N/A N/A Classification: Medium N/A N/A Exudate A mount: Serosanguineous N/A N/A Exudate Type: red, brown N/A N/A Exudate Color: Medium (34-66%) N/A N/A Granulation A mount:  Red, Pink N/A N/A Granulation Quality: None Present (0%) N/A N/A Necrotic A mount: Fat Layer (Subcutaneous Tissue): Yes N/A N/A Exposed Structures: Bone: Yes Fascia: No Tendon: No Muscle: No Duval, [name] ([id number]) [id number] Page 5 of 9 Joint: No [name] Notes Electronic Signature(s) Signed: 11/29/2022 1:24:46 PM By: [name] Aver MSN RN CNS WTA Entered By: [name] on 11/25/2022 09:48:18 -------------------------------------------------------------------------------- Multi-Disciplinary Care Plan Details Patient Name: Date of Service: [name], [name] [name] 11/25/2022 12:15 PM Medical Record Number: [id number] Patient Account Number: [id number] Date of Birth/Sex: Treating RN: 04-07-1941 (82 y.o. [name] Primary Care Emmalina Espericueta: [name] Other Clinician: Referring Jayelyn Barno: Treating Frutoso Dimare/Extender: [name]: 5 Active Inactive Pressure Nursing Diagnoses: Knowledge deficit related to causes and risk factors for pressure ulcer development Knowledge deficit related to management of pressures ulcers Goals: Patient will remain free from development of additional pressure ulcers Date Initiated: 10/21/2022 Date Inactivated: 11/25/2022 Target Resolution Date: 11/21/2022 Goal Status: Met Patient will remain free of pressure ulcers Date Initiated: 10/21/2022 Date Inactivated: 11/25/2022 Target Resolution Date: 11/21/2022 Goal Status:  Met Patient/caregiver will verbalize risk factors for pressure ulcer development Date Initiated: 10/21/2022 Target Resolution Date: 12/22/2022 Goal Status: Active Patient/caregiver will verbalize understanding of pressure ulcer management Date Initiated: 10/21/2022 Target Resolution Date: 12/22/2022 Goal Status: Active Interventions: Assess: immobility, friction, shearing, incontinence upon admission and as needed Assess offloading mechanisms upon admission and as needed Assess potential for pressure ulcer upon admission and as needed Provide education on pressure ulcers Notes: Wound/Skin Impairment Nursing Diagnoses: Impaired tissue integrity Knowledge deficit related to ulceration/compromised skin integrity Goals: Patient/caregiver will verbalize understanding of skin care regimen Date Initiated: 10/21/2022 Date Inactivated: 11/25/2022 Target Resolution Date: 11/21/2022 Goal Status: Met Ulcer/skin breakdown will have a volume reduction of 30% by week 4 Date Initiated: 10/21/2022 Date Inactivated: 11/25/2022 Target Resolution Date: 11/21/2022 Evadna, Emanuel Paytan [name] ([id number]) [id number] Page 6 of 9 Goal Status: Met Ulcer/skin breakdown will have a volume reduction of 50% by week 8 Date Initiated: 10/21/2022 Target Resolution Date: 12/22/2022 Goal Status: Active Ulcer/skin breakdown will have a volume reduction of 80% by week 12 Date Initiated: 10/21/2022 Target Resolution Date: 01/21/2023 Goal Status: Active Ulcer/skin breakdown will heal within 14 weeks Date Initiated: 10/21/2022 Target Resolution Date: 02/04/2023 Goal Status: Active Interventions: Assess patient/caregiver ability to obtain necessary supplies Assess patient/caregiver ability to perform ulcer/skin care regimen upon admission and as needed Assess ulceration(s) every visit Provide education on ulcer and skin care [name] Activities: Patient referred to home care : 10/21/2022 Skin care regimen  initiated : 10/21/2022 Topical wound management initiated : 10/21/2022 Notes: Electronic Signature(s) Signed: 11/29/2022 1:24:46 PM By: [name] Aver MSN RN CNS WTA Entered By: [name] on 11/25/2022 10:10:00 -------------------------------------------------------------------------------- Pain Assessment Details Patient Name: Date of Service: [name] Number [name] 11/25/2022 12:15 PM Medical Record Number: [id number] Patient Account Number: [id number] Date of Birth/Sex: Treating RN: 04-15-1940 (82 y.o. [name] Primary Care Debby Clyne: [name] Other Clinician: Referring Kaziyah Parkison: Treating Kaisy Severino/Extender: [name]: 5 Active Problems Location of Pain Severity and Description of Pain Patient Has Paino Yes Site Locations Rate the pain. Current Pain Level: 8 Pain Management and Medication Bergevin, [name] ([id number]) [phone].pdf Page 7 of 9 Current Pain Management: Electronic Signature(s) Signed: 11/29/2022 1:24:46 PM By: [name] Aver MSN RN CNS WTA Entered By: [name] on 11/25/2022 09:46:05 -------------------------------------------------------------------------------- Patient/Caregiver Education Details Patient Name: Date of Service: [name] 8/19/2024andnbsp12:15 PM Medical Record Number: [id number] Patient  Account Number: [id number] Date of Birth/Gender: Treating RN: 31-May-1940 (82 y.o. [name] Primary Care Physician: [name] Other Clinician: Referring Physician: Treating Physician/Extender: [name]: 5 Education Assessment Education Provided To: Patient Education Topics Provided Wound/Skin Impairment: Handouts: Caring for Your Ulcer Methods: Explain/Verbal Responses: State content correctly Electronic Signature(s) Signed: 11/29/2022 1:24:46 PM By: [name] Aver MSN RN CNS WTA Entered By: [name] on 11/25/2022  10:10:14 -------------------------------------------------------------------------------- Wound Assessment Details Patient Name: Date of Service: [name], [name] [name] 11/25/2022 12:15 PM Medical Record Number: [id number] Patient Account Number: [id number] Date of Birth/Sex: Treating RN: [birth year] (82 y.o. [name] Primary Care Joslyn Ramos: [name] Other Clinician: Referring Avneet Ashmore: Treating Madina Galati/Extender: [name]: 5 Wound Status Wound Number: 1 Primary Etiology: Pressure Ulcer Wound Location: Midline Coccyx Wound Status: Open Wounding Event: Pressure Injury Comorbid History: Hypertension, Received Chemotherapy Hechler, [name] ([id number]) [id number] Page 8 of 9 Date Acquired: 08/30/2022 Weeks Of [name]: 5 Clustered Wound: No Photos Wound Measurements Length: (cm) 7 Width: (cm) 4.7 Depth: (cm) 1.4 Area: (cm) 25.84 Volume: (cm) 36.175 % Reduction in Area: 40.5% % Reduction in Volume: 66.7% Tunneling: Yes Position (o'clock): 12 Maximum Distance: (cm) 1.5 Wound Description Classification: Category/Stage IV Exudate Amount: Medium Exudate Type: Serosanguineous Exudate Color: red, brown Foul Odor After Cleansing: No Slough/Fibrino No Wound Bed Granulation Amount: Medium (34-66%) Exposed Structure Granulation Quality: Red, Pink Fascia Exposed: No Necrotic Amount: None Present (0%) Fat Layer (Subcutaneous Tissue) Exposed: Yes Tendon Exposed: No Muscle Exposed: No Joint Exposed: No Bone Exposed: Yes [name] Notes Wound #1 (Coccyx) Wound Laterality: Midline Cleanser Dakin 16 (oz) 0.25 Discharge Instruction: Moisten gauze and place on the wound Normal Saline Discharge Instruction: Wash your hands with soap and water. Remove old dressing, discard into plastic bag and place into trash. Cleanse the wound with Normal Saline prior to applying a clean dressing using gauze sponges, not tissues or cotton  balls. Do not scrub or use excessive force. Pat dry using gauze sponges, not tissue or cotton balls. Peri-Wound Care Topical Primary Dressing Gauze Discharge Instruction: As directed: dry, moistened with saline or moistened with Dakins Solution Secondary Dressing ABD Pad 5x9 (in/in) Discharge Instruction: Cover with ABD pad Secured With Medipore T - 7M Medipore H Soft Cloth Surgical T ape ape, 2x2 (in/yd) Compression Wrap Compression Stockings Add-Ons Electronic Signature(s) Chizek, [name] ([id number]) [phone].pdf Page 9 of 9 Signed: 11/29/2022 1:24:46 PM By: [name] Aver MSN RN CNS WTA Entered By: [name] on 11/25/2022 09:47:13 -------------------------------------------------------------------------------- Vitals Details Patient Name: Date of Service: [name] Number [name] 11/25/2022 12:15 PM Medical Record Number: [id number] Patient Account Number: [id number] Date of Birth/Sex: Treating RN: [birth year] (82 y.o. [name] Primary Care Mohamed Portlock: [name] Other Clinician: Referring Anastasiya Gowin: Treating Conrad Zajkowski/Extender: [name]: 5 Vital Signs Time Taken: 12:45 Temperature (F): 97.7 Pulse (bpm): 72 Respiratory Rate (breaths/min): 18 Blood Pressure (mmHg): 149/107 Reference Range: 80 - 120 mg / dl Electronic Signature(s) Signed: 11/29/2022 1:24:46 PM By: [name] Aver MSN RN CNS WTA Entered By: [name] on 11/25/2022 09:45:56

## 2022-12-02 ENCOUNTER — Encounter: Payer: Medicare PPO | Admitting: Internal Medicine

## 2022-12-02 DIAGNOSIS — I129 Hypertensive chronic kidney disease with stage 1 through stage 4 chronic kidney disease, or unspecified chronic kidney disease: Secondary | ICD-10-CM | POA: Diagnosis not present

## 2022-12-02 DIAGNOSIS — L89159 Pressure ulcer of sacral region, unspecified stage: Secondary | ICD-10-CM | POA: Diagnosis not present

## 2022-12-02 DIAGNOSIS — G893 Neoplasm related pain (acute) (chronic): Secondary | ICD-10-CM | POA: Diagnosis not present

## 2022-12-02 DIAGNOSIS — I10 Essential (primary) hypertension: Secondary | ICD-10-CM | POA: Diagnosis not present

## 2022-12-02 DIAGNOSIS — M8448XD Pathological fracture, other site, subsequent encounter for fracture with routine healing: Secondary | ICD-10-CM | POA: Diagnosis not present

## 2022-12-02 DIAGNOSIS — D630 Anemia in neoplastic disease: Secondary | ICD-10-CM | POA: Diagnosis not present

## 2022-12-02 DIAGNOSIS — Z9221 Personal history of antineoplastic chemotherapy: Secondary | ICD-10-CM | POA: Diagnosis not present

## 2022-12-02 DIAGNOSIS — G894 Chronic pain syndrome: Secondary | ICD-10-CM | POA: Diagnosis not present

## 2022-12-02 DIAGNOSIS — L89154 Pressure ulcer of sacral region, stage 4: Secondary | ICD-10-CM | POA: Diagnosis not present

## 2022-12-02 DIAGNOSIS — M6281 Muscle weakness (generalized): Secondary | ICD-10-CM | POA: Diagnosis not present

## 2022-12-02 DIAGNOSIS — C7951 Secondary malignant neoplasm of bone: Secondary | ICD-10-CM | POA: Diagnosis not present

## 2022-12-02 DIAGNOSIS — E43 Unspecified severe protein-calorie malnutrition: Secondary | ICD-10-CM | POA: Diagnosis not present

## 2022-12-02 DIAGNOSIS — I824Z2 Acute embolism and thrombosis of unspecified deep veins of left distal lower extremity: Secondary | ICD-10-CM | POA: Diagnosis not present

## 2022-12-02 DIAGNOSIS — C9000 Multiple myeloma not having achieved remission: Secondary | ICD-10-CM | POA: Diagnosis not present

## 2022-12-02 DIAGNOSIS — I272 Pulmonary hypertension, unspecified: Secondary | ICD-10-CM | POA: Diagnosis not present

## 2022-12-02 NOTE — Progress Notes (Unsigned)
[facility] OFFICE PROGRESS NOTE  [name], MD 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 Dorris Fetch on January 28, 2022.   CURRENT THERAPY:  1) Starting systemic therapy with Revlimid 21 days on 7 days off, Darzalex, and 20 mg of Decadron weekly.  First dose expected next week on 09/11/2022.  Status post 3 cycles.  2) Monthly Zometa injections for 6 to 12 months, followed by every 3 months thereon after once dental clearance is obtained.  INTERVAL HISTORY: [name] 82 y.o. female returns to the clinic today for a follow-up visit.  She was last seen by Dr. Arbutus Ped on 11/20/2022.  She is currently undergoing treatment with Revlimid, Darzalex, and Decadron.  She is tolerating this well.  She denies any concerning complaints today. She reports stable  fatigue and generalized weakness.  Denies any fever, chills, night sweats, or unexplained weight loss.  Her appetite continues to be so-so. She is scheduled to see palliative care and nutrition today in the infusion room. She drinks protein supplemental drinks once per day. Denies any chest pain, shortness of breath, recent cough, or hemoptysis.  Denies any nausea, vomiting, diarrhea, or constipation.  Denies any signs or symptoms of infection including sore throat, nasal congestion, cough, or hemoptysis except she is being treated with antibiotics on her pressure ulcer. She has wound care coming by the house 3x per week. Se also goes to wound care once a week at the wound center at [facility].  Denies any abnormal bleeding or bruising.  She had a repeat myeloma panel performed at her last  appointment for the results were not available.  She is here today for repeat blood work before undergoing day 1 cycle 4.   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 by echo 10/2021   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/2015   Carotid artery stenosis    < 50% stenosis bilaterally by doppler 07/2016   Coronary artery disease 2008   Coronary Ca score of 331 with minimal multivessel plaque < 25% stenosis by coronary CTA 8/23   Heart murmur    per pt   Hypercholesteremia    Hypertension    Obesity    Osteopenia    declines treatment   Pneumonia 1995   Pulmonary HTN (HCC)    mild to moderate by echo 7/23 with PASP   Shoulder pain    Due to arthritis   Vitamin D insufficiency     ALLERGIES:  is allergic to crestor [rosuvastatin calcium], lactose, lipitor [atorvastatin], pravastatin, simvastatin, tramadol, zetia [ezetimibe], codeine, penicillins, sulfa antibiotics, and vancomycin.  MEDICATIONS:  Current Outpatient Medications  Medication Sig Dispense Refill   omeprazole (PRILOSEC) 20 MG capsule Take 1 capsule (20 mg total) by mouth daily. 30 capsule 3   acetaminophen (TYLENOL) 325 MG tablet Take 2 tablets (650 mg  total) by mouth every 8 (eight) hours as needed.     acyclovir (ZOVIRAX) 200 MG capsule Take 200 mg by mouth 2 (two) times daily.     allopurinol (ZYLOPRIM) 100 MG tablet Take 1 tablet (100 mg total) by mouth 2 (two) times daily. 60 tablet 2   ascorbic acid (VITAMIN C) 500 MG tablet Take 1 tablet (500 mg total) by mouth daily.     daratumumab-hyaluronidase-fihj (DARZALEX FASPRO) 1800-30000 MG-UT/15ML SOLN Inject 1,800 mg into the skin once.     dexamethasone (DECADRON) 2 MG tablet Take 2 mg by mouth daily. For pain  management - prescribed at hospital discharge on 10/02/22 and refilled 11/06/22.     dexamethasone (DECADRON) 4 MG tablet Please take 5 tablets (20 mg) weekly on the days of treatment 40 tablet 3   fluconazole (DIFLUCAN) 150 MG tablet Take 150 mg by mouth once a week. Once a week- for rash. Not completely sure of dose     gentamicin ointment (GARAMYCIN) 0.1 % Apply 1 Application topically daily.     H-CHLOR 12 0.125 % SOLN      HYDROcodone-acetaminophen (NORCO/VICODIN) 5-325 MG tablet Take 1 tablet by mouth every 6 (six) hours as needed for moderate pain.     lenalidomide (REVLIMID) 15 MG capsule TAKE 1 CAPSULE (15MG ) BY MOUTH DAILY 21 capsule 0   magic mouthwash SOLN Take 5 mLs by mouth 4 (four) times daily. Swish and spit 240 mL 0   Multiple Vitamin (MULTIVITAMIN WITH MINERALS) TABS tablet Take 1 tablet by mouth daily.     multivitamin (RENA-VIT) TABS tablet Take 1 tablet by mouth at bedtime. (Patient not taking: Reported on 10/09/2022)  0   naloxone (NARCAN) nasal spray 4 mg/0.1 mL      nystatin cream (MYCOSTATIN) Apply 1 Application topically 2 (two) times daily.     oxycodone (OXY-IR) 5 MG capsule Take 5 mg by mouth every 6 (six) hours.     sodium hypochlorite (DAKIN'S 1/4 STRENGTH) 0.125 % SOLN 1 application Externally Once a day for 30 days     No current facility-administered medications for this visit.    SURGICAL HISTORY:  Past Surgical History:  Procedure Laterality Date   AORTIC VALVE REPLACEMENT N/A 10/14/2013   Procedure: AORTIC VALVE REPLACEMENT (AVR);  Surgeon: Alleen Borne, MD;  Location: [facility] OR;  Service: Open Heart Surgery;  Laterality: N/A;   CARDIAC CATHETERIZATION     CATARACT EXTRACTION, BILATERAL     CHOLECYSTECTOMY  04/08/1988   ESOPHAGOGASTRODUODENOSCOPY (EGD) WITH PROPOFOL N/A 09/05/2022   Procedure: ESOPHAGOGASTRODUODENOSCOPY (EGD) WITH PROPOFOL;  Surgeon: Kathi Der, MD;  Location: [facility] ENDOSCOPY;  Service: Gastroenterology;  Laterality: N/A;    INTRAOPERATIVE TRANSESOPHAGEAL ECHOCARDIOGRAM N/A 10/14/2013   Procedure: INTRAOPERATIVE TRANSESOPHAGEAL ECHOCARDIOGRAM;  Surgeon: Alleen Borne, MD;  Location: [facility] OR;  Service: Open Heart Surgery;  Laterality: N/A;   IVC FILTER INSERTION N/A 09/06/2022   Procedure: IVC FILTER INSERTION;  Surgeon: Leonie Douglas, MD;  Location: [facility] INVASIVE CV LAB;  Service: Cardiovascular;  Laterality: N/A;   KYPHOPLASTY Bilateral 03/27/2022   Procedure: KYPHOPLASTY AND BIOPSY THORACIC ELEVEN;  Surgeon: Lisbeth Renshaw, MD;  Location: [facility] OR;  Service: Neurosurgery;  Laterality: Bilateral;   LEFT AND RIGHT HEART CATHETERIZATION WITH CORONARY ANGIOGRAM N/A 09/16/2013   Procedure: LEFT AND RIGHT HEART CATHETERIZATION WITH CORONARY ANGIOGRAM;  Surgeon: Quintella Reichert, MD;  Location: [facility] CATH LAB;  Service: Cardiovascular;  Laterality: N/A;   TONSILLECTOMY      REVIEW OF SYSTEMS:  Review of Systems  Constitutional: Positive for stable fatigue and decreased appetite. Negative for chills, fever and unexpected weight change.  HENT: Negative for mouth sores, nosebleeds, sore throat and trouble swallowing.   Eyes: Negative for eye problems and icterus.  Respiratory: Negative for cough, hemoptysis, shortness of breath and wheezing.   Cardiovascular: Negative for chest pain and leg swelling.  Gastrointestinal: Negative for abdominal pain, constipation, diarrhea, nausea and vomiting.  Genitourinary: Negative for bladder incontinence, difficulty urinating, dysuria, frequency and hematuria.   Musculoskeletal: Negative for back pain, gait problem, neck pain and neck stiffness.  Skin: Negative for itching and rash.  Neurological: Negative for dizziness, extremity weakness, gait problem, headaches, light-headedness and seizures.  Hematological: Negative for adenopathy. Does not bruise/bleed easily.  Psychiatric/Behavioral: Negative for confusion, depression and sleep disturbance. The patient is not nervous/anxious.      PHYSICAL EXAMINATION:  Blood pressure (!) 154/52, pulse 61, temperature 98.1 F (36.7 C), temperature source Oral, resp. rate 13, weight 139 lb 9.6 oz (63.3 kg), SpO2 100%.  ECOG PERFORMANCE STATUS: 1 - Symptomatic but completely ambulatory  Physical Exam  Constitutional: Oriented to person, place, and time and well-developed, well-nourished, and in no distress. No distress.  HENT:  Head: Normocephalic and atraumatic.  Mouth/Throat: Oropharynx is clear and moist. No oropharyngeal exudate.  Eyes: Conjunctivae are normal. Right eye exhibits no discharge. Left eye exhibits no discharge. No scleral icterus.  Neck: Normal range of motion. Neck supple.  Cardiovascular: Normal rate, regular rhythm, normal heart sounds and intact distal pulses.   Pulmonary/Chest: Effort normal and breath sounds normal. No respiratory distress. No wheezes. No rales.  Abdominal: Soft. Bowel sounds are normal. Exhibits no distension and no mass. There is no tenderness.  Musculoskeletal: Normal range of motion. Exhibits no edema.  Lymphadenopathy:    No cervical adenopathy.  Neurological: Alert and oriented to person, place, and time. Exhibits normal muscle tone. Gait normal. Coordination normal.  Skin: Skin is warm and dry. No rash noted. Not diaphoretic. No erythema. No pallor.  Psychiatric: Mood, memory and judgment normal.  Vitals reviewed.  LABORATORY DATA: Lab Results  Component Value Date   WBC 9.8 12/04/2022   HGB 9.8 (L) 12/04/2022   HCT 30.9 (L) 12/04/2022   MCV 104.0 (H) 12/04/2022   PLT 254 12/04/2022      Chemistry      Component Value Date/Time   NA 142 11/20/2022 0942   NA 141 10/06/2020 0939   K 3.3 (L) 11/20/2022 0942   CL 106 11/20/2022 0942   CO2 27 11/20/2022 0942   BUN 30 (H) 11/20/2022 0942   BUN 17 10/06/2020 0939   CREATININE 0.63 11/20/2022 0942   CREATININE 0.85 01/29/2016 0910      Component Value Date/Time   CALCIUM 7.8 (L) 11/20/2022 0942   ALKPHOS 213 (H)  11/20/2022 0942   AST 21 11/20/2022 0942   ALT 40 11/20/2022 0942   BILITOT 0.6 11/20/2022 0942       RADIOGRAPHIC STUDIES:  No results found.   ASSESSMENT/PLAN:  This is a very pleasant 82 year old Caucasian female with  1) history of stage I (T1a, N0, M0) thymoma type AB diagnosed in October 2023 status post resection under the care of Dr. Dorris Fetch on January 28, 2022 with close resection margin and microscopic infiltration of the capsule. 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     She is currently undergoing treatment with systemic therapy with daratumumab, Revlimid 15 mg p.o. daily  for 21 days every 4 weeks in addition to Decadron 20 mg weekly with the treatment.  She is status post 3 cycles and tolerating this well.  The patient had a repeat myeloma panel performed.  The patient was seen with Dr. Arbutus Ped today.  Dr. Arbutus Ped reviewed the results of her myeloma panel.  It shows improvement in her condition. Dr. Arbutus Ped recommends that she *** on the same treatment at the same dose.   We will see her back for a follow up visit in 2 weeks for evaluation and repeat blood work before starting cycle #3 day 15.    She will continue taking melatonin for insomnia.  The patient was advised to call immediately if she has any concerning symptoms in the interval. The patient voices understanding of current disease status and treatment options and is in agreement with the current care plan. All questions were answered. The patient knows to call the clinic with any problems, questions or concerns. We can certainly see the patient much sooner if necessary  No orders of the defined types were placed in this encounter.     [name] Aristizabal L Makari Sanko, PA-C 12/04/22  ADDENDUM: Hematology/Oncology Attending: I had a face-to-face encounter with the patient today.  I reviewed her records, lab and recommended her care plan.  This is a very pleasant 82 years old white  female with a stage II multiple myeloma, IgG subtype diagnosed in May 2024 was 35% plasma cells from the bone marrow in addition to bone lesions. The patient is currently undergoing systemic treatment with daratumumab, Revlimid and Decadron status post 3 cycles.  She has been tolerating this treatment well with no concerning adverse effects. She had repeat myeloma panel performed recently.  I discussed the lab result with the patient and her son and she had significant improvement in her disease after the first 3 cycles of her treatment. I recommended for her to continue her current treatment with the same regimen and she will proceed with cycle #4 today. For the bone disease, she will continue her current treatment with Zometa. She will come back for follow-up visit in 2 weeks for evaluation before the next dose of her treatment. The patient was advised to call immediately if she has any other concerning symptoms in the interval. The total time spent in the appointment was 30 minutes. Disclaimer: This note was dictated with voice recognition software. Similar sounding words can inadvertently be transcribed and may be missed upon review. Lajuana Matte, MD

## 2022-12-03 NOTE — Progress Notes (Signed)
[name] [name] [name] ([id number]) [id number] Page 1 of 6 Visit Report for 12/02/2022 HPI Details Patient Name: Date of Service: [name] [name] [name] 12/02/2022 9:45 A M Medical Record Number: [id number] Patient Account Number: [id number] Date of Birth/Sex: Treating RN: Sep 11, 1940 (82 y.o. 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 Currently there is not direct bone exposure because she is now covered with granulation tissue her family has been taking care of her at home at this point although she was previously in a facility. This is something that was noted when she ended up having to go to the ER initially on Aug 30, 2022. Subsequently during hospital stent it was started that she had gentamicin applied topically along with Dakin's moistened gauze packing which has been done at home now and seems to be doing very well. She was at [facility] skilled nursing facility for time. She did have Levaquin previous which she did do very well for her as far as get the infection under control in my opinion I think she is actually seeming to do quite well at this point. She is not having any pain which is also excellent news at this time. She is starting to get around more so she is not completely bedbound although she is a little weak she does have family that is very attentive and taking great care of her. 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 There is an area of bone that is very thinly covered. Been using Dakin's wet-to-dry dressings. She has in-home caregivers 24/7 and also home health coming out to change dressings/participate in dressing supervision although most of her dressing changes are being done by her own in-home caregivers. 11-04-2022 upon evaluation today patient appears to be doing well currently in regard to her wound. With that being said I do feel like she is making really good progress towards healing currently. I do not see any evidence of active infection locally nor systemically which is excellent news as well. No fevers, chills, nausea, vomiting, or diarrhea. 11-12-2022 patient's wound today appears to be doing okay although to be honest I am a little concerned about the fact that the wound VAC was not put on properly. It was not bridged she was laying on the tubing and this course was not very comfortable for her. Fortunately I do not see any signs of active infection locally or systemically which is great news. No fevers, chills, nausea, vomiting, or diarrhea. 11-18-2022 upon evaluation today patient appears to be doing well currently in regard to her wound. This is actually showing signs of improvement wound VAC was put on much more eloquently this time compared to last I am actually very pleased. He does not like there may be some irritation around the edges of the wound I think this may be subsequent to an issue going on with infection. We can look into trying to determine what this may be and then adjusting medications as needed to take care of that infection if so. 11-25-2022 upon  evaluation today patient appears to be doing well currently in regard to her wound which is actually showing signs of improvement feeling little by little. The irritation around the edges of the wound is better and the wound VAC seems to have been doing well until he ran out of the DuoDERM and apparently last time this was put  on the DuoDERM was not utilized. Again I think that is something that has been extremely beneficial for keeping this wound VAC in place without any complications in the past. 8/26; patient comes in with home health changing the wound VAC 3 times a week Monday Wednesday and Friday. Per our intake nurse the tunneling area superiorly has close down nicely. Progress has been good. She did not come in with a wound VAC on, they apparently took it off so she could be a but home health is coming in later today to change it. There is some concerns about next Monday being a holiday with regards to wound VAC change by home health Electronic Signature(s) Signed: 12/02/2022 4:34:10 PM By: Baltazar Najjar MD Entered By: Baltazar Najjar on 12/02/2022 07:32:53 [name] [name] [name] ([id number]) [id number] Page 2 of 6 -------------------------------------------------------------------------------- Physical Exam Details Patient Name: Date of Service: [name] [name] [name] 12/02/2022 9:45 A M Medical Record Number: [id number] Patient Account Number: [id number] Date of Birth/Sex: Treating RN: [birth year] (82 y.o. [name] [name] Primary Care Provider: Hillard Danker Other Clinician: Referring Provider: Treating Provider/Extender: RO BSO N, MICHA EL [name] [name], [name] [name] Weeks in Treatment: 6 Constitutional Sitting or standing Blood Pressure is within target range for patient.. Pulse regular and within target range for patient.Marland Kitchen Respirations regular, non-labored and within target range.. Temperature is normal and within the target range for the patient.Marland Kitchen appears in no distress. Notes Wound exam; fairly large wound over the lower sacrum and surrounding buttocks. However the tunneling areas that were part of the superior part of this wounds all seem to have filled then. Under lamination the surface has some slough I did not debride this today. No evidence of surrounding infection Electronic  Signature(s) Signed: 12/02/2022 4:34:10 PM By: Baltazar Najjar MD Entered By: Baltazar Najjar on 12/02/2022 07:34:24 -------------------------------------------------------------------------------- Physician Orders Details Patient Name: Date of Service: [name] [name] 12/02/2022 9:45 A M Medical Record Number: [id number] Patient Account Number: [id number] Date of Birth/Sex: Treating RN: July 21, 1940 (82 y.o. [name] [name] Primary Care Provider: Hillard Danker Other Clinician: Referring Provider: Treating Provider/Extender: RO BSO N, MICHA EL Rolinda Roan Weeks in Treatment: 6 Verbal / Phone Orders: No Diagnosis Coding Follow-up Appointments Return Appointment in 1 week. Home Health Home Health Company: - [facility] CONTINUE Home Health for wound care. May utilize formulary equivalent dressing for wound treatment orders unless otherwise specified. Home Health Nurse may visit PRN to address patients wound care needs. - [facility]--Remove dressing and clean the wound with normal saline, use skin barrier wipes prior to apply the drape. Apply wound vac foam and use the Duoderm (may cut in a U shape). Please use drape to outline the wound prior to placing the foam dressing in the wound. Please bridge the vac from the wound up to the hip to prevent lying on the tubing. Must use foam over the drape to bridge. Please see photo sent with patient and link for video. [email or web address] Change MWF. Patient will come to wound center on Monday. We will remove vac and HH RN will need to come in the afternoon to replace the vac. Please call if you have any  questions. Patient must be lying in bed. Scheduled days for dressing changes to be completed; exception, patient has scheduled wound care visit that day. **Please direct any NON-WOUND related issues/requests for orders to patient's Primary Care Physician. **If current dressing causes regression in wound condition, may D/C  ordered dressing product/s and apply Normal Saline Moist Dressing daily until next Wound Healing Center or Other MD appointment. **Notify Wound Healing Center of regression in wound condition at [phone]. Bathing/ Shower/ Hygiene [place], [place] [name] ([id number]) [id number] Page 3 of 6 May shower; gently cleanse wound with antibacterial soap, rinse and pat dry prior to dressing wounds No tub bath. Anesthetic (Use 'Patient Medications' Section for Anesthetic Order Entry) Lidocaine applied to wound bed Off-Loading Low air-loss mattress (Group 2) Turn and reposition every 2 hours Negative Pressure Wound Therapy Other: - NPWT to be applied on Monday 11/04/22 by [facility] nurse Wound Treatment Wound #1 - Coccyx Wound Laterality: Midline Cleanser: Dakin 16 (oz) 0.25 (Home Health) 1 x Per Day/30 Days Discharge Instructions: Moisten gauze and place on the wound Cleanser: Normal Saline (Home Health) 1 x Per Day/30 Days Discharge Instructions: Wash your hands with soap and water. Remove old dressing, discard into plastic bag and place into trash. Cleanse the wound with Normal Saline prior to applying a clean dressing using gauze sponges, not tissues or cotton balls. Do not scrub or use excessive force. Pat dry using gauze sponges, not tissue or cotton balls. Prim Dressing: Gauze (Home Health) 1 x Per Day/30 Days ary Discharge Instructions: As directed: dry, moistened with saline or moistened with Dakins Solution Secondary Dressing: ABD Pad 5x9 (in/in) (Home Health) 1 x Per Day/30 Days Discharge Instructions: Cover with ABD pad Secured With: Medipore T - 62M Medipore H Soft Cloth Surgical T ape ape, 2x2 (in/yd) (Home Health) 1 x Per Day/30 Days Electronic Signature(s) Signed: 12/02/2022 4:34:10 PM By: Baltazar Najjar MD Signed: 12/02/2022 5:33:22 PM By: Angelina Pih Entered By: Angelina Pih on 12/02/2022  07:39:35 -------------------------------------------------------------------------------- Problem List Details Patient Name: Date of Service: [name] [name] 12/02/2022 9:45 A M Medical Record Number: [id number] Patient Account Number: [id number] Date of Birth/Sex: Treating RN: [birth year] (82 y.o. [name] [name] Primary Care Provider: Hillard Danker Other Clinician: Referring Provider: Treating Provider/Extender: RO BSO N, MICHA EL Rolinda Roan Weeks in Treatment: 6 Active Problems ICD-10 Encounter Code Description Active Date MDM Diagnosis L89.154 Pressure ulcer of sacral region, stage 4 10/21/2022 No Yes M62.81 Muscle weakness (generalized) 10/21/2022 No Yes C90.00 Multiple myeloma not having achieved remission 10/21/2022 No Yes [name] [name] [name] [name] ([id number]) [id number] Page 4 of 6 Inactive Problems Resolved Problems Electronic Signature(s) Signed: 12/02/2022 4:34:10 PM By: Baltazar Najjar MD Entered By: Baltazar Najjar on 12/02/2022 07:30:25 -------------------------------------------------------------------------------- Progress Note Details Patient Name: Date of Service: [name] [name] 12/02/2022 9:45 A M Medical Record Number: [id number] Patient Account Number: [id number] Date of Birth/Sex: Treating RN: 1940/10/19 (82 y.o. 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 Currently there is not direct bone exposure because she is now covered with granulation tissue her family has been taking care of her at home at this point although she was previously in a facility. This is  something  that was noted when she ended up having to go to the ER initially on Aug 30, 2022. Subsequently during hospital stent it was started that she had gentamicin applied topically along with Dakin's moistened gauze packing which has been done at home now and seems to be doing very well. She was at [facility] skilled nursing facility for time. She did have Levaquin previous which she did do very well for her as far as get the infection under control in my opinion I think she is actually seeming to do quite well at this point. She is not having any pain which is also excellent news at this time. She is starting to get around more so she is not completely bedbound although she is a little weak she does have family that is very attentive and taking great care of her. 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 There is an area of bone that is very thinly covered. Been using Dakin's wet-to-dry dressings. She has in-home caregivers 24/7 and also home health coming out to change dressings/participate in dressing supervision although most of her dressing changes are being done by her own in-home caregivers. 11-04-2022 upon evaluation today patient appears to be doing well currently in regard to her wound. With that being said I do feel like she is making really good progress towards healing currently. I do not see any evidence of active infection locally nor systemically which is excellent news as well. No fevers, chills, nausea, vomiting, or diarrhea. 11-12-2022 patient's wound today appears to be doing okay although to be honest I am a little concerned about the fact that the wound VAC was not put on properly. It was not bridged she was laying on the tubing and this course was not very comfortable for her. Fortunately I do not see any signs  of active infection locally or systemically which is great news. No fevers, chills, nausea, vomiting, or diarrhea. 11-18-2022 upon evaluation today patient appears to be doing well currently in regard to her wound. This is actually showing signs of improvement wound VAC was put on much more eloquently this time compared to last I am actually very pleased. He does not like there may be some irritation around the edges of the wound I think this may be subsequent to an issue going on with infection. We can look into trying to determine what this may be and then adjusting medications as needed to take care of that infection if so. 11-25-2022 upon evaluation today patient appears to be doing well currently in regard to her wound which is actually showing signs of improvement feeling little by little. The irritation around the edges of the wound is better and the wound VAC seems to have been doing well until he ran out of the DuoDERM and apparently last time this was put on the DuoDERM was not utilized. Again I think that is something that has been extremely beneficial for keeping this wound VAC in place without any complications in the past. 8/26; patient comes in with home health changing the wound VAC 3 times a week Monday Wednesday and Friday. Per our intake nurse the tunneling area superiorly has close down nicely. Progress has been good. She did not come in with a wound VAC on, they apparently took it off so she could be a  but home health is coming in later today to change it. There is some concerns about next Monday being a holiday with regards to wound VAC change by home health Objective [name] [name] [name] [name] ([id number]) [phone].pdf Page 5 of 6 Constitutional Sitting or standing Blood Pressure is within target range for patient.. Pulse regular and within target range for patient.Marland Kitchen Respirations regular, non-labored and within target range.. Temperature is normal and within the  target range for the patient.Marland Kitchen appears in no distress. Vitals Time Taken: 10:00 AM, Temperature: 98.2 F, Pulse: 62 bpm, Respiratory Rate: 18 breaths/min, Blood Pressure: 137/71 mmHg. General Notes: Wound exam; fairly large wound over the lower sacrum and surrounding buttocks. However the tunneling areas that were part of the superior part of this wounds all seem to have filled then. Under lamination the surface has some slough I did not debride this today. No evidence of surrounding infection Integumentary (Hair, Skin) Wound #1 status is Open. Original cause of wound was Pressure Injury. The date acquired was: 08/30/2022. The wound has been in treatment 6 weeks. The wound is located on the Midline Coccyx. The wound measures 6.2cm length x 4.1cm width x 0.6cm depth; 19.965cm^2 area and 11.979cm^3 volume. There is bone and Fat Layer (Subcutaneous Tissue) exposed. There is no undermining noted, however, there is tunneling at 12:00 with a maximum distance of 0.5cm. There is a medium amount of serosanguineous drainage noted. There is medium (34-66%) red, pink granulation within the wound bed. There is a medium (34- 66%) amount of necrotic tissue within the wound bed. Assessment Active Problems ICD-10 Pressure ulcer of sacral region, stage 4 Muscle weakness (generalized) Multiple myeloma not having achieved remission Plan Follow-up Appointments: Return Appointment in 1 week. Home Health: Home Health Company: - [facility] CONTINUE Home Health for wound care. May utilize formulary equivalent dressing for wound treatment orders unless otherwise specified. Home Health Nurse may visit PRN to address patients wound care needs. - [facility]--Remove dressing and clean the wound with normal saline, use skin barrier wipes prior to apply the drape. Apply wound vac foam and use the Duoderm (may cut in a U shape). Please use drape to outline the wound prior to placing the foam dressing in the wound. Please  bridge the vac from the wound up to the hip to prevent lying on the tubing. Must use foam over the drape to bridge. Please see photo sent with patient and link for video. [email or web address] Change MWF. Patient will come to wound center on Monday. We will remove vac and HH RN will need to come in the afternoon to replace the vac. Please call if you have any questions. Patient must be lying in bed. Scheduled days for dressing changes to be completed; exception, patient has scheduled wound care visit that day. **Please direct any NON-WOUND related issues/requests for orders to patient's Primary Care Physician. **If current dressing causes regression in wound condition, may D/C ordered dressing product/s and apply Normal Saline Moist Dressing daily until next Wound Healing Center or Other MD appointment. **Notify Wound Healing Center of regression in wound condition at [phone]. Bathing/ Shower/ Hygiene: May shower; gently cleanse wound with antibacterial soap, rinse and pat dry prior to dressing wounds No tub bath. Anesthetic (Use 'Patient Medications' Section for Anesthetic Order Entry): Lidocaine applied to wound bed Off-Loading: Low air-loss mattress (Group 2) Turn and reposition every 2 hours Negative Pressure Wound Therapy: Other: - NPWT to be applied on Monday 11/04/22 by [facility] nurse WOUND #1: - Coccyx Wound Laterality: Midline Cleanser:  Dakin 16 (oz) 0.25 (Home Health) 1 x Per Day/30 Days Discharge Instructions: Moisten gauze and place on the wound Cleanser: Normal Saline (Home Health) 1 x Per Day/30 Days Discharge Instructions: Wash your hands with soap and water. Remove old dressing, discard into plastic bag and place into trash. Cleanse the wound with Normal Saline prior to applying a clean dressing using gauze sponges, not tissues or cotton balls. Do not scrub or use excessive force. Pat dry using gauze sponges, not tissue or cotton balls. Prim  Dressing: Gauze (Home Health) 1 x Per Day/30 Days ary Discharge Instructions: As directed: dry, moistened with saline or moistened with Dakins Solution Secondary Dressing: ABD Pad 5x9 (in/in) (Home Health) 1 x Per Day/30 Days Discharge Instructions: Cover with ABD pad Secured With: Medipore T - 41M Medipore H Soft Cloth Surgical T ape ape, 2x2 (in/yd) (Home Health) 1 x Per Day/30 Days 1. The wound appears to be doing well with the wound VAC being changed 3 times a week. As mentioned she did not come in with a wound VAC on but home health will come out later today to her home to change this. I think they put a wet-to-dry dressing on it in the interim 2. Next Monday is a holiday with regards to her appointment and whether home health is going to change this. I told him that they could stay out and come back and see [place] in 2 weeks. If home health is not coming out next Monday then they can either remove it and simply put a wet-to-dry dressing on or they could actually leave it in place 3. Hopefully in 2 weeks we can determine whether we are getting further epithelialization and the wound dimensions are receding 4 she is apparently on Levaquin now there is no evidence of current infection around this wound [name] [name] [name] [name] ([id number]) [phone].pdf Page 6 of 6 Electronic Signature(s) Signed: 12/02/2022 4:34:10 PM By: Baltazar Najjar MD Entered By: Baltazar Najjar on 12/02/2022 07:37:05 -------------------------------------------------------------------------------- SuperBill Details Patient Name: Date of Service: [name] [name] 12/02/2022 Medical Record Number: [id number] Patient Account Number: [id number] Date of Birth/Sex: Treating RN: 30-Jul-1940 (82 y.o. [name] [name] Primary Care Provider: Hillard Danker Other Clinician: Referring Provider: Treating Provider/Extender: RO BSO N, MICHA EL [name] [name], [name] [name] Weeks in Treatment: 6 Diagnosis Coding ICD-10 Codes Code  Description L89.154 Pressure ulcer of sacral region, stage 4 M62.81 Muscle weakness (generalized) C90.00 Multiple myeloma not having achieved remission Facility Procedures : 7 CPT4 Code: [id number] Description: 99213 - WOUND CARE VISIT-LEV 3 EST PT Modifier: Quantity: 1 Physician Procedures : CPT4 Code Description Modifier [id number] - WC PHYS LEVEL 3 - EST PT ICD-10 Diagnosis Description L89.154 Pressure ulcer of sacral region, stage 4 Quantity: 1 Electronic Signature(s) Signed: 12/02/2022 10:40:18 AM By: Angelina Pih Signed: 12/02/2022 4:34:10 PM By: Baltazar Najjar MD Entered By: Angelina Pih on 12/02/2022 07:40:18

## 2022-12-03 NOTE — Progress Notes (Signed)
[name] [name] ([id number]) [id number] Page 1 of 9 Visit Report for 12/02/2022 Arrival Information Details Patient Name: Date of Service: [name] [name], [name] [name] [name] 12/02/2022 9:45 A M Medical Record [name]: [id number] Patient Account [name]: [id number] Date of Birth/Sex: Treating RN: 04/25/1940 (82 y.o. Esmeralda Links Primary Care Janeva Peaster: Hillard Danker Other Clinician: Referring Samanatha Brammer: Treating Kiasia Chou/Extender: RO BSO N, MICHA EL [name], [name] Weeks in Treatment: 6 Visit Information History Since Last Visit Added or deleted any medications: No Patient Arrived: [name] Any new allergies or adverse reactions: No Arrival Time: 10:03 Had a fall or experienced change in No Accompanied By: daughter activities of daily living that may affect Transfer Assistance: None risk of falls: Patient Identification Verified: Yes Hospitalized since last visit: No Secondary Verification Process Completed: Yes Has Dressing in Place as Prescribed: Yes Patient Requires Transmission-Based Precautions: No Pain Present Now: Yes Patient Has Alerts: Yes Patient Alerts: Not Diabetic Electronic Signature(s) Signed: 12/02/2022 5:33:22 PM By: Angelina Pih Entered By: Angelina Pih on 12/02/2022 07:04:17 -------------------------------------------------------------------------------- Clinic Level of Care Assessment Details Patient Name: Date of Service: [name] [name] 12/02/2022 9:45 A M Medical Record [name]: [id number] Patient Account [name]: [id number] Date of Birth/Sex: Treating RN: 1941/01/18 (82 y.o. Esmeralda Links Primary Care Colburn Asper: Hillard Danker Other Clinician: Referring Varetta Chavers: Treating Cris Talavera/Extender: RO BSO N, MICHA EL [name], [name] Weeks in Treatment: 6 Clinic Level of Care Assessment Items TOOL 4 Quantity Score []  - 0 Use when only an EandM is performed on FOLLOW-UP visit ASSESSMENTS - Nursing Assessment / Reassessment X- 1 10 Reassessment of  Co-morbidities (includes updates in patient status) X- 1 5 Reassessment of Adherence to Treatment Plan ASSESSMENTS - Wound and Skin A ssessment / Reassessment X - Simple Wound Assessment / Reassessment - one wound 1 5 [name] [name], [name] [name] ([id number]) [id number] Page 2 of 9 []  - 0 Complex Wound Assessment / Reassessment - multiple wounds []  - 0 Dermatologic / Skin Assessment (not related to wound area) ASSESSMENTS - Focused Assessment []  - 0 Circumferential Edema Measurements - multi extremities []  - 0 Nutritional Assessment / Counseling / Intervention []  - 0 Lower Extremity Assessment (monofilament, tuning fork, pulses) []  - 0 Peripheral Arterial Disease Assessment (using hand held doppler) ASSESSMENTS - Ostomy and/or Continence Assessment and Care []  - 0 Incontinence Assessment and Management []  - 0 Ostomy Care Assessment and Management (repouching, etc.) PROCESS - Coordination of Care X - Simple Patient / Family Education for ongoing care 1 15 []  - 0 Complex (extensive) Patient / Family Education for ongoing care X- 1 10 Staff obtains Chiropractor, Records, T Results / Process Orders est []  - 0 Staff telephones HHA, Nursing Homes / Clarify orders / etc []  - 0 Routine Transfer to another Facility (non-emergent condition) []  - 0 Routine Hospital Admission (non-emergent condition) []  - 0 New Admissions / Manufacturing engineer / Ordering NPWT Apligraf, etc. , []  - 0 Emergency Hospital Admission (emergent condition) X- 1 10 Simple Discharge Coordination []  - 0 Complex (extensive) Discharge Coordination PROCESS - Special Needs []  - 0 Pediatric / Minor Patient Management []  - 0 Isolation Patient Management []  - 0 Hearing / Language / Visual special needs []  - 0 Assessment of Community assistance (transportation, D/C planning, etc.) []  - 0 Additional assistance / Altered mentation []  - 0 Support Surface(s) Assessment (bed, cushion, seat,  etc.) INTERVENTIONS - Wound Cleansing / Measurement X - Simple Wound Cleansing - one wound 1 5 []  - 0 Complex Wound Cleansing - multiple wounds X- 1  5 Wound Imaging (photographs - any [name] of wounds) []  - 0 Wound Tracing (instead of photographs) X- 1 5 Simple Wound Measurement - one wound []  - 0 Complex Wound Measurement - multiple wounds INTERVENTIONS - Wound Dressings X - Small Wound Dressing one or multiple wounds 1 10 []  - 0 Medium Wound Dressing one or multiple wounds []  - 0 Large Wound Dressing one or multiple wounds X- 1 5 Application of Medications - topical []  - 0 Application of Medications - injection INTERVENTIONS - Miscellaneous []  - 0 External ear exam []  - 0 Specimen Collection (cultures, biopsies, blood, body fluids, etc.) []  - 0 Specimen(s) / Culture(s) sent or taken to Lab for analysis [name] [name], [name] [name] ([id number]) [id number] Page 3 of 9 []  - 0 Patient Transfer (multiple staff / Nurse, adult / Similar devices) []  - 0 Simple Staple / Suture removal (25 or less) []  - 0 Complex Staple / Suture removal (26 or more) []  - 0 Hypo / Hyperglycemic Management (close monitor of Blood Glucose) []  - 0 Ankle / Brachial Index (ABI) - do not check if billed separately X- 1 5 Vital Signs Has the patient been seen at the hospital within the last three years: Yes Total Score: 90 Level Of Care: New/Established - Level 3 Electronic Signature(s) Signed: 12/02/2022 5:33:22 PM By: Angelina Pih Entered By: Angelina Pih on 12/02/2022 07:40:10 -------------------------------------------------------------------------------- Encounter Discharge Information Details Patient Name: Date of Service: [name] [name] [name] 12/02/2022 9:45 A M Medical Record [name]: [id number] Patient Account [name]: [id number] Date of Birth/Sex: Treating RN: [birth year] (82 y.o. Esmeralda Links Primary Care Fredi Hurtado: Hillard Danker Other Clinician: Referring Allyah Heather: Treating  Margueritte Guthridge/Extender: RO BSO N, MICHA EL Rolinda Roan Weeks in Treatment: 6 Encounter Discharge Information Items Discharge Condition: Stable Ambulatory Status: Walker Discharge Destination: Home Transportation: Private Auto Accompanied By: daughter Schedule Follow-up Appointment: Yes Clinical Summary of Care: Electronic Signature(s) Signed: 12/02/2022 10:42:26 AM By: Angelina Pih Entered By: Angelina Pih on 12/02/2022 07:42:26 -------------------------------------------------------------------------------- Lower Extremity Assessment Details Patient Name: Date of Service: ASLEE, EDLIN [name] 12/02/2022 9:45 A M Medical Record [name]: [id number] Patient Account [name]: [id number] Date of Birth/Sex: Treating RN: 04/10/1940 (82 y.o. Esmeralda Links Primary Care Karinne Schmader: Hillard Danker Other Clinician: Pricilla Holm, Donette [name] ([id number]) [id number] Page 4 of 9 Referring Sonam Wandel: Treating Tahisha Hakim/Extender: RO BSO N, MICHA EL [name], [name] Weeks in Treatment: 6 Electronic Signature(s) Signed: 12/02/2022 5:33:22 PM By: Angelina Pih Entered By: Angelina Pih on 12/02/2022 07:05:38 -------------------------------------------------------------------------------- Multi Wound Chart Details Patient Name: Date of Service: [name] [name] [name] 12/02/2022 9:45 A M Medical Record [name]: [id number] Patient Account [name]: [id number] Date of Birth/Sex: Treating RN: 1941-01-01 (82 y.o. Esmeralda Links Primary Care Sally-Mekesha Cutbirth: Hillard Danker Other Clinician: Referring Randon Somera: Treating Schylar Wuebker/Extender: RO BSO N, MICHA EL [name], [name] Weeks in Treatment: 6 Vital Signs Height(in): Pulse(bpm): 62 Weight(lbs): Blood Pressure(mmHg): 137/71 Body Mass Index(BMI): Temperature(F): 98.2 Respiratory Rate(breaths/min): 18 [1:Photos:] [N/A:N/A] Midline Coccyx N/A N/A Wound Location: Pressure Injury N/A N/A Wounding Event: Pressure Ulcer N/A N/A Primary Etiology: Hypertension,  Received N/A N/A Comorbid History: Chemotherapy 08/30/2022 N/A N/A Date Acquired: 6 N/A N/A Weeks of Treatment: Open N/A N/A Wound Status: No N/A N/A Wound Recurrence: 6.2x4.1x0.6 N/A N/A Measurements L x W x D (cm) 19.965 N/A N/A A (cm) : rea 11.979 N/A N/A Volume (cm) : 54.00% N/A N/A % Reduction in A rea: 89.00% N/A N/A % Reduction in Volume: 12 Position 1 (o'clock): 0.5 Maximum Distance 1 (cm): Yes N/A N/A  Tunneling: Category/Stage IV N/A N/A Classification: Medium N/A N/A Exudate A mount: Serosanguineous N/A N/A Exudate Type: red, brown N/A N/A Exudate Color: Medium (34-66%) N/A N/A Granulation A mount: Red, Pink N/A N/A Granulation Quality: Medium (34-66%) N/A N/A Necrotic A mount: Fat Layer (Subcutaneous Tissue): Yes N/A N/A Exposed Structures: Bone: Yes Fascia: No Tendon: No Muscle: No [name] [name], [name] [name] ([id number]) [id number] Page 5 of 9 Joint: No Treatment Notes Electronic Signature(s) Signed: 12/02/2022 10:39:17 AM By: Angelina Pih Entered By: Angelina Pih on 12/02/2022 07:39:16 -------------------------------------------------------------------------------- Multi-Disciplinary Care Plan Details Patient Name: Date of Service: [name] [name] [name] 12/02/2022 9:45 A M Medical Record [name]: [id number] Patient Account [name]: [id number] Date of Birth/Sex: Treating RN: [birth year] (82 y.o. Esmeralda Links Primary Care Tacha Manni: Hillard Danker Other Clinician: Referring Jaan Fischel: Treating Chelsye Suhre/Extender: RO BSO N, MICHA EL [name], [name] Weeks in Treatment: 6 Active Inactive Pressure Nursing Diagnoses: Knowledge deficit related to causes and risk factors for pressure ulcer development Knowledge deficit related to management of pressures ulcers Goals: Patient will remain free from development of additional pressure ulcers Date Initiated: 10/21/2022 Date Inactivated: 11/25/2022 Target Resolution Date: 11/21/2022 Goal  Status: Met Patient will remain free of pressure ulcers Date Initiated: 10/21/2022 Date Inactivated: 11/25/2022 Target Resolution Date: 11/21/2022 Goal Status: Met Patient/caregiver will verbalize risk factors for pressure ulcer development Date Initiated: 10/21/2022 Target Resolution Date: 12/22/2022 Goal Status: Active Patient/caregiver will verbalize understanding of pressure ulcer management Date Initiated: 10/21/2022 Target Resolution Date: 12/22/2022 Goal Status: Active Interventions: Assess: immobility, friction, shearing, incontinence upon admission and as needed Assess offloading mechanisms upon admission and as needed Assess potential for pressure ulcer upon admission and as needed Provide education on pressure ulcers Notes: Wound/Skin Impairment Nursing Diagnoses: Impaired tissue integrity Knowledge deficit related to ulceration/compromised skin integrity Goals: Patient/caregiver will verbalize understanding of skin care regimen Date Initiated: 10/21/2022 Date Inactivated: 11/25/2022 Target Resolution Date: 11/21/2022 Goal Status: Met Ulcer/skin breakdown will have a volume reduction of 30% by week 4 Date Initiated: 10/21/2022 Date Inactivated: 11/25/2022 Target Resolution Date: 11/21/2022 [name] [name], [name] [name] ([id number]) [id number] Page 6 of 9 Goal Status: Met Ulcer/skin breakdown will have a volume reduction of 50% by week 8 Date Initiated: 10/21/2022 Target Resolution Date: 12/22/2022 Goal Status: Active Ulcer/skin breakdown will have a volume reduction of 80% by week 12 Date Initiated: 10/21/2022 Target Resolution Date: 01/21/2023 Goal Status: Active Ulcer/skin breakdown will heal within 14 weeks Date Initiated: 10/21/2022 Target Resolution Date: 02/04/2023 Goal Status: Active Interventions: Assess patient/caregiver ability to obtain necessary supplies Assess patient/caregiver ability to perform ulcer/skin care regimen upon admission and as  needed Assess ulceration(s) every visit Provide education on ulcer and skin care Treatment Activities: Patient referred to home care : 10/21/2022 Skin care regimen initiated : 10/21/2022 Topical wound management initiated : 10/21/2022 Notes: Electronic Signature(s) Signed: 12/02/2022 10:40:28 AM By: Angelina Pih Entered By: Angelina Pih on 12/02/2022 07:40:28 -------------------------------------------------------------------------------- Pain Assessment Details Patient Name: Date of Service: [name] [name] [name] 12/02/2022 9:45 A M Medical Record [name]: [id number] Patient Account [name]: [id number] Date of Birth/Sex: Treating RN: 08-12-1940 (82 y.o. Esmeralda Links Primary Care Domnick Chervenak: Hillard Danker Other Clinician: Referring Derotha Fishbaugh: Treating Jex Strausbaugh/Extender: RO BSO N, MICHA EL [name], [name] Weeks in Treatment: 6 Active Problems Location of Pain Severity and Description of Pain Patient Has Paino Yes Site Locations Rate the pain. Current Pain Level: 5 Pain Management and Medication [name] [name], [name] [name] ([id number]) [id number] Page 7 of 9 Current Pain Management: Electronic Signature(s) Signed: 12/02/2022 5:33:22 PM By: Angelina Pih  Entered By: Angelina Pih on 12/02/2022 07:04:40 -------------------------------------------------------------------------------- Patient/Caregiver Education Details Patient Name: Date of Service: [name] [name], [name] [name] 8/26/2024andnbsp9:45 A M Medical Record [name]: [id number] Patient Account [name]: [id number] Date of Birth/Gender: Treating RN: 11-Jun-1940 (82 y.o. Esmeralda Links Primary Care Physician: Hillard Danker Other Clinician: Referring Physician: Treating Physician/Extender: RO BSO N, MICHA EL Rolinda Roan Weeks in Treatment: 6 Education Assessment Education Provided To: Patient and Caregiver Education Topics Provided Wound/Skin Impairment: Handouts: Caring for Your Ulcer Methods: Explain/Verbal Responses:  State content correctly Electronic Signature(s) Signed: 12/02/2022 5:33:22 PM By: Angelina Pih Entered By: Angelina Pih on 12/02/2022 07:40:38 -------------------------------------------------------------------------------- Wound Assessment Details Patient Name: Date of Service: [name] [name], [name] [name] 12/02/2022 9:45 A M Medical Record [name]: [id number] Patient Account [name]: [id number] Date of Birth/Sex: Treating RN: Aug 09, 1940 (82 y.o. Esmeralda Links Primary Care Mylena Sedberry: Hillard Danker Other Clinician: Referring Dinora Hemm: Treating Enolia Koepke/Extender: RO BSO N, MICHA EL [name], [name] Weeks in Treatment: 6 Wound Status Wound [name]: 1 Primary Etiology: Pressure Ulcer Wound Location: Midline Coccyx Wound Status: Open Wounding Event: Pressure Injury Comorbid History: Hypertension, Received Chemotherapy [name] [name], [name] [name] ([id number]) [id number] Page 8 of 9 Date Acquired: 08/30/2022 Weeks Of Treatment: 6 Clustered Wound: No Photos Wound Measurements Length: (cm) 6.2 Width: (cm) 4.1 Depth: (cm) 0.6 Area: (cm) 19.965 Volume: (cm) 11.979 % Reduction in Area: 54% % Reduction in Volume: 89% Tunneling: Yes Position (o'clock): 12 Maximum Distance: (cm) 0.5 Undermining: No Wound Description Classification: Category/Stage IV Exudate Amount: Medium Exudate Type: Serosanguineous Exudate Color: red, brown Foul Odor After Cleansing: No Slough/Fibrino Yes Wound Bed Granulation Amount: Medium (34-66%) Exposed Structure Granulation Quality: Red, Pink Fascia Exposed: No Necrotic Amount: Medium (34-66%) Fat Layer (Subcutaneous Tissue) Exposed: Yes Tendon Exposed: No Muscle Exposed: No Joint Exposed: No Bone Exposed: Yes Treatment Notes Wound #1 (Coccyx) Wound Laterality: Midline Cleanser Dakin 16 (oz) 0.25 Discharge Instruction: Moisten gauze and place on the wound Normal Saline Discharge Instruction: Wash your hands with soap and water. Remove old  dressing, discard into plastic bag and place into trash. Cleanse the wound with Normal Saline prior to applying a clean dressing using gauze sponges, not tissues or cotton balls. Do not scrub or use excessive force. Pat dry using gauze sponges, not tissue or cotton balls. Peri-Wound Care Topical Primary Dressing Gauze Discharge Instruction: As directed: dry, moistened with saline or moistened with Dakins Solution Secondary Dressing ABD Pad 5x9 (in/in) Discharge Instruction: Cover with ABD pad Secured With Medipore T - 46M Medipore H Soft Cloth Surgical T ape ape, 2x2 (in/yd) Compression Wrap Compression Stockings Add-Ons [name] [name], [name] [name] ([id number]) [id number] Page 9 of 9 Electronic Signature(s) Signed: 12/02/2022 5:33:22 PM By: Angelina Pih Entered By: Angelina Pih on 12/02/2022 07:05:29 -------------------------------------------------------------------------------- Vitals Details Patient Name: Date of Service: [name] [name] [name] 12/02/2022 9:45 A M Medical Record [name]: [id number] Patient Account [name]: [id number] Date of Birth/Sex: Treating RN: September 21, 1940 (82 y.o. Esmeralda Links Primary Care Takeisha Cianci: Hillard Danker Other Clinician: Referring Halo Laski: Treating Kennethia Lynes/Extender: RO BSO N, MICHA EL [name], [name] Weeks in Treatment: 6 Vital Signs Time Taken: 10:00 Temperature (F): 98.2 Pulse (bpm): 62 Respiratory Rate (breaths/min): 18 Blood Pressure (mmHg): 137/71 Reference Range: 80 - 120 mg / dl Electronic Signature(s) Signed: 12/02/2022 5:33:22 PM By: Angelina Pih Entered By: Angelina Pih on 12/02/2022 07:04:33

## 2022-12-04 ENCOUNTER — Inpatient Hospital Stay: Payer: Medicare PPO

## 2022-12-04 ENCOUNTER — Inpatient Hospital Stay: Payer: Medicare PPO | Admitting: Dietician

## 2022-12-04 ENCOUNTER — Inpatient Hospital Stay: Payer: Medicare PPO | Admitting: Nurse Practitioner

## 2022-12-04 ENCOUNTER — Inpatient Hospital Stay: Payer: Medicare PPO | Admitting: Physician Assistant

## 2022-12-04 DIAGNOSIS — C9000 Multiple myeloma not having achieved remission: Secondary | ICD-10-CM

## 2022-12-04 DIAGNOSIS — Z5112 Encounter for antineoplastic immunotherapy: Secondary | ICD-10-CM | POA: Diagnosis not present

## 2022-12-04 DIAGNOSIS — Z79899 Other long term (current) drug therapy: Secondary | ICD-10-CM | POA: Diagnosis not present

## 2022-12-04 DIAGNOSIS — C7951 Secondary malignant neoplasm of bone: Secondary | ICD-10-CM | POA: Diagnosis not present

## 2022-12-04 DIAGNOSIS — I129 Hypertensive chronic kidney disease with stage 1 through stage 4 chronic kidney disease, or unspecified chronic kidney disease: Secondary | ICD-10-CM | POA: Diagnosis not present

## 2022-12-04 DIAGNOSIS — G893 Neoplasm related pain (acute) (chronic): Secondary | ICD-10-CM | POA: Diagnosis not present

## 2022-12-04 DIAGNOSIS — E43 Unspecified severe protein-calorie malnutrition: Secondary | ICD-10-CM | POA: Diagnosis not present

## 2022-12-04 DIAGNOSIS — D649 Anemia, unspecified: Secondary | ICD-10-CM | POA: Diagnosis not present

## 2022-12-04 DIAGNOSIS — L89154 Pressure ulcer of sacral region, stage 4: Secondary | ICD-10-CM | POA: Diagnosis not present

## 2022-12-04 DIAGNOSIS — I272 Pulmonary hypertension, unspecified: Secondary | ICD-10-CM | POA: Diagnosis not present

## 2022-12-04 DIAGNOSIS — Z79624 Long term (current) use of inhibitors of nucleotide synthesis: Secondary | ICD-10-CM | POA: Diagnosis not present

## 2022-12-04 DIAGNOSIS — D630 Anemia in neoplastic disease: Secondary | ICD-10-CM | POA: Diagnosis not present

## 2022-12-04 DIAGNOSIS — I824Z2 Acute embolism and thrombosis of unspecified deep veins of left distal lower extremity: Secondary | ICD-10-CM | POA: Diagnosis not present

## 2022-12-04 LAB — CMP (CANCER CENTER ONLY)
ALT: 26 U/L (ref 0–44)
AST: 14 U/L — ABNORMAL LOW (ref 15–41)
Albumin: 3.1 g/dL — ABNORMAL LOW (ref 3.5–5.0)
Alkaline Phosphatase: 177 U/L — ABNORMAL HIGH (ref 38–126)
Anion gap: 6 (ref 5–15)
BUN: 26 mg/dL — ABNORMAL HIGH (ref 8–23)
CO2: 30 mmol/L (ref 22–32)
Calcium: 8.5 mg/dL — ABNORMAL LOW (ref 8.9–10.3)
Chloride: 110 mmol/L (ref 98–111)
Creatinine: 0.71 mg/dL (ref 0.44–1.00)
GFR, Estimated: >60 mL/min (ref >60)
Glucose, Bld: 148 mg/dL — ABNORMAL HIGH (ref 70–99)
Potassium: 3.5 mmol/L (ref 3.5–5.1)
Sodium: 146 mmol/L — ABNORMAL HIGH (ref 135–145)
Total Bilirubin: 0.3 mg/dL (ref 0.3–1.2)
Total Protein: 5.1 g/dL — ABNORMAL LOW (ref 6.5–8.1)

## 2022-12-04 LAB — CBC WITH DIFFERENTIAL (CANCER CENTER ONLY)
Abs Immature Granulocytes: 0.07 10*3/uL (ref 0.00–0.07)
Basophils Absolute: 0.1 10*3/uL (ref 0.0–0.1)
Basophils Relative: 1 %
Eosinophils Absolute: 0 10*3/uL (ref 0.0–0.5)
Eosinophils Relative: 0 %
HCT: 30.9 % — ABNORMAL LOW (ref 36.0–46.0)
Hemoglobin: 9.8 g/dL — ABNORMAL LOW (ref 12.0–15.0)
Immature Granulocytes: 1 %
Lymphocytes Relative: 28 %
Lymphs Abs: 2.8 10*3/uL (ref 0.7–4.0)
MCH: 33 pg (ref 26.0–34.0)
MCHC: 31.7 g/dL (ref 30.0–36.0)
MCV: 104 fL — ABNORMAL HIGH (ref 80.0–100.0)
Monocytes Absolute: 1.2 10*3/uL — ABNORMAL HIGH (ref 0.1–1.0)
Monocytes Relative: 12 %
Neutro Abs: 5.6 10*3/uL (ref 1.7–7.7)
Neutrophils Relative %: 58 %
Platelet Count: 254 10*3/uL (ref 150–400)
RBC: 2.97 MIL/uL — ABNORMAL LOW (ref 3.87–5.11)
RDW: 18.5 % — ABNORMAL HIGH (ref 11.5–15.5)
WBC Count: 9.8 10*3/uL (ref 4.0–10.5)
nRBC: 0 % (ref 0.0–0.2)

## 2022-12-04 LAB — SAMPLE TO BLOOD BANK

## 2022-12-04 LAB — LACTATE DEHYDROGENASE: LDH: 193 U/L — ABNORMAL HIGH (ref 98–192)

## 2022-12-04 MED ORDER — DEXAMETHASONE 4 MG PO TABS
20.0000 mg | ORAL_TABLET | Freq: Once | ORAL | Status: AC
  Administered 2022-12-04: 20 mg via ORAL
  Filled 2022-12-04: qty 5

## 2022-12-04 MED ORDER — ACETAMINOPHEN 325 MG PO TABS
650.0000 mg | ORAL_TABLET | Freq: Once | ORAL | Status: AC
  Administered 2022-12-04: 650 mg via ORAL
  Filled 2022-12-04: qty 2

## 2022-12-04 MED ORDER — DIPHENHYDRAMINE HCL 25 MG PO CAPS
50.0000 mg | ORAL_CAPSULE | Freq: Once | ORAL | Status: AC
  Administered 2022-12-04: 50 mg via ORAL
  Filled 2022-12-04: qty 2

## 2022-12-04 MED ORDER — DARATUMUMAB-HYALURONIDASE-FIHJ 1800-30000 MG-UT/15ML ~~LOC~~ SOLN
1800.0000 mg | Freq: Once | SUBCUTANEOUS | Status: AC
  Administered 2022-12-04: 1800 mg via SUBCUTANEOUS
  Filled 2022-12-04: qty 15

## 2022-12-04 NOTE — Progress Notes (Signed)
Nutrition Follow-up:  Pt with multiple myeloma. She is receiving DaraRd q28d. Patient is under the care of Dr. Arbutus Ped.   RD went to infusion to see pt as scheduled for nutrition follow-up. Noted precautions outside room. Spoke with infusion RN. Patient tested positive for Covid. She is a poor historian at baseline. RD did not attempt to contact pt via telephone. Chart reviewed. 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 Wound Vac in place and changed by home health 3x/week.   Medications: reviewed   Labs: Na 146, glucose 148, BUN 26, albumin 3.1  Anthropometrics: Wt 139 lb 9.6 oz - stable  8/14 - 141 lb 12.8  7/31 - 139 lb 14.4 6/19 - 140 lb 1.6  NUTRITION DIAGNOSIS: Unintended wt loss stable   INTERVENTION:  Encourage high calorie high protein foods to support wound healing    MONITORING, EVALUATION, GOAL: wt trends, intake   NEXT VISIT: Wednesday September 28 in infusion

## 2022-12-04 NOTE — Progress Notes (Deleted)
Palliative Medicine [facility]  [phone] Fax:(336) [phone]   Name: [name] Date: 12/04/2022 MRN: [id number]  DOB: [birth year]  Patient Care Team: [name], MD as PCP - General (Internal Medicine) Quintella Reichert, MD as PCP - Cardiology (Cardiology) Quintella Reichert, MD as Consulting Physician (Cardiology)    INTERVAL HISTORY: [name] is a 82 y.o. 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 Palliative ask to see for symptom management and goals of care.   SOCIAL HISTORY:     reports that she has never smoked. She has never used smokeless tobacco. She reports that she does not drink alcohol and does not use drugs.  ADVANCE DIRECTIVES:  Advanced directives on file  CODE STATUS: DNR  PAST 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 by echo 10/2021   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/2015   Carotid artery stenosis    < 50% stenosis bilaterally by doppler 07/2016   Coronary artery disease 2008   Coronary Ca score of 331 with minimal multivessel plaque < 25% stenosis by coronary CTA 8/23   Heart murmur    per pt   Hypercholesteremia    Hypertension    Obesity    Osteopenia    declines treatment   Pneumonia 1995   Pulmonary HTN (HCC)    mild to moderate by echo 7/23 with PASP   Shoulder pain    Due to arthritis   Vitamin D insufficiency     ALLERGIES:  is allergic to crestor [rosuvastatin calcium], lactose, lipitor [atorvastatin], pravastatin, simvastatin, tramadol, zetia [ezetimibe], codeine, penicillins, sulfa antibiotics, and  vancomycin.  MEDICATIONS:  Current Outpatient Medications  Medication Sig Dispense Refill   omeprazole (PRILOSEC) 20 MG capsule Take 1 capsule (20 mg total) by mouth daily. 30 capsule 3   acetaminophen (TYLENOL) 325 MG tablet Take 2 tablets (650 mg total) by mouth every 8 (eight) hours as needed.     acyclovir (ZOVIRAX) 200 MG capsule Take 200 mg by mouth 2 (two) times daily.     allopurinol (ZYLOPRIM) 100 MG tablet Take 1 tablet (100 mg total) by mouth 2 (two) times daily. 60 tablet 2   ascorbic acid (VITAMIN C) 500 MG tablet Take 1 tablet (500 mg total) by mouth daily.     daratumumab-hyaluronidase-fihj (DARZALEX FASPRO) 1800-30000 MG-UT/15ML SOLN Inject 1,800 mg into the skin once.     dexamethasone (DECADRON) 2 MG tablet Take 2 mg by mouth daily. For pain management - prescribed at hospital discharge on 10/02/22 and refilled 11/06/22.     dexamethasone (DECADRON) 4 MG tablet Please take 5 tablets (20 mg) weekly on the days of treatment 40 tablet 3   fluconazole (DIFLUCAN) 150 MG tablet Take 150 mg by mouth once a week. Once a week- for rash. Not completely sure of dose     gentamicin ointment (GARAMYCIN) 0.1 % Apply 1 Application topically daily.     H-CHLOR 12 0.125 % SOLN      HYDROcodone-acetaminophen (NORCO/VICODIN) 5-325 MG tablet Take 1 tablet by mouth every  6 (six) hours as needed for moderate pain.     lenalidomide (REVLIMID) 15 MG capsule TAKE 1 CAPSULE (15MG ) BY MOUTH DAILY 21 capsule 0   magic mouthwash SOLN Take 5 mLs by mouth 4 (four) times daily. Swish and spit 240 mL 0   Multiple Vitamin (MULTIVITAMIN WITH MINERALS) TABS tablet Take 1 tablet by mouth daily.     multivitamin (RENA-VIT) TABS tablet Take 1 tablet by mouth at bedtime. (Patient not taking: Reported on 10/09/2022)  0   naloxone (NARCAN) nasal spray 4 mg/0.1 mL      nystatin cream (MYCOSTATIN) Apply 1 Application topically 2 (two) times daily.     oxycodone (OXY-IR) 5 MG capsule Take 5 mg by mouth every 6 (six)  hours.     sodium hypochlorite (DAKIN'S 1/4 STRENGTH) 0.125 % SOLN 1 application Externally Once a day for 30 days     No current facility-administered medications for this visit.    VITAL SIGNS: There were no vitals taken for this visit. There were no vitals filed for this visit.  Estimated body mass index is 26.79 kg/m as calculated from the following:   Height as of 11/20/22: 5\' 1"  (1.549 m).   Weight as of 11/20/22: 141 lb 12.8 oz (64.3 kg).   PERFORMANCE STATUS (ECOG) : 3 - Symptomatic, >50% confined to bed   Physical Exam General: NAD Cardiovascular: regular rate and rhythm Pulmonary: normal breathing pattern  Extremities: no edema, no joint deformities Skin: no rashes Neurological: AAO x4  IMPRESSION:   Neoplasm related pain/decubitus ulcer [name] states her pain is much improved. She is not requiring pain medication. Occasional Tylenol use. Is not requiring oxycodone since returning home. Son reports most of her pain is in her sacral area when changing dressing or changing position. Once she is done or stabilized pain is resolved.    No adjustments to current regimen at this time.  We will continue to closely monitor.  Son knows to contact our office as needed.   Insomnia [name] feels she is sleeping much better since returning home in her own environment. Not requiring any supplements such as melatonin.   I discussed the importance of continued conversation with family and their medical providers regarding overall plan of care and treatment options, ensuring decisions are within the context of the patients values and GOCs.  PLAN: Tylenol 650 mg every 8 hours as needed Ongoing symptom management and goals of care discussions as needed I will plan to see patient back in 2-4 weeks in collaboration to other oncology appointments.  Family knows to contact office sooner if needed.   Patient expressed understanding and was in agreement with this plan. She also  understands that She can call the clinic at any time with any questions, concerns, or complaints.   Any controlled substances utilized were prescribed in the context of palliative care. PDMP has been reviewed.    Visit consisted of counseling and education dealing with the complex and emotionally intense issues of symptom management and palliative care in the setting of serious and potentially life-threatening illness.Greater than 50%  of this time was spent counseling and coordinating care related to the above assessment and plan.  Willette Alma, AGPCNP-BC  Palliative Medicine Team/ Cancer Center  *Please note that this is a verbal dictation therefore any spelling or grammatical errors are due to the "[employer]agon [employer] One" system interpretation.

## 2022-12-05 ENCOUNTER — Encounter: Payer: Self-pay | Admitting: Physician Assistant

## 2022-12-05 ENCOUNTER — Encounter: Payer: Self-pay | Admitting: Internal Medicine

## 2022-12-05 NOTE — Progress Notes (Signed)
error 

## 2022-12-06 DIAGNOSIS — I272 Pulmonary hypertension, unspecified: Secondary | ICD-10-CM | POA: Diagnosis not present

## 2022-12-06 DIAGNOSIS — I824Z2 Acute embolism and thrombosis of unspecified deep veins of left distal lower extremity: Secondary | ICD-10-CM | POA: Diagnosis not present

## 2022-12-06 DIAGNOSIS — C9000 Multiple myeloma not having achieved remission: Secondary | ICD-10-CM | POA: Diagnosis not present

## 2022-12-06 DIAGNOSIS — I129 Hypertensive chronic kidney disease with stage 1 through stage 4 chronic kidney disease, or unspecified chronic kidney disease: Secondary | ICD-10-CM | POA: Diagnosis not present

## 2022-12-06 DIAGNOSIS — D630 Anemia in neoplastic disease: Secondary | ICD-10-CM | POA: Diagnosis not present

## 2022-12-06 DIAGNOSIS — G893 Neoplasm related pain (acute) (chronic): Secondary | ICD-10-CM | POA: Diagnosis not present

## 2022-12-06 DIAGNOSIS — E43 Unspecified severe protein-calorie malnutrition: Secondary | ICD-10-CM | POA: Diagnosis not present

## 2022-12-06 DIAGNOSIS — L89154 Pressure ulcer of sacral region, stage 4: Secondary | ICD-10-CM | POA: Diagnosis not present

## 2022-12-06 DIAGNOSIS — C7951 Secondary malignant neoplasm of bone: Secondary | ICD-10-CM | POA: Diagnosis not present

## 2022-12-10 ENCOUNTER — Other Ambulatory Visit: Payer: Self-pay | Admitting: Medical Oncology

## 2022-12-10 DIAGNOSIS — C7951 Secondary malignant neoplasm of bone: Secondary | ICD-10-CM | POA: Diagnosis not present

## 2022-12-10 DIAGNOSIS — C9000 Multiple myeloma not having achieved remission: Secondary | ICD-10-CM | POA: Diagnosis not present

## 2022-12-10 DIAGNOSIS — I272 Pulmonary hypertension, unspecified: Secondary | ICD-10-CM | POA: Diagnosis not present

## 2022-12-10 DIAGNOSIS — D630 Anemia in neoplastic disease: Secondary | ICD-10-CM | POA: Diagnosis not present

## 2022-12-10 DIAGNOSIS — I824Z2 Acute embolism and thrombosis of unspecified deep veins of left distal lower extremity: Secondary | ICD-10-CM | POA: Diagnosis not present

## 2022-12-10 DIAGNOSIS — L89154 Pressure ulcer of sacral region, stage 4: Secondary | ICD-10-CM | POA: Diagnosis not present

## 2022-12-10 DIAGNOSIS — G893 Neoplasm related pain (acute) (chronic): Secondary | ICD-10-CM | POA: Diagnosis not present

## 2022-12-10 DIAGNOSIS — E43 Unspecified severe protein-calorie malnutrition: Secondary | ICD-10-CM | POA: Diagnosis not present

## 2022-12-10 DIAGNOSIS — I129 Hypertensive chronic kidney disease with stage 1 through stage 4 chronic kidney disease, or unspecified chronic kidney disease: Secondary | ICD-10-CM | POA: Diagnosis not present

## 2022-12-10 MED ORDER — OMEPRAZOLE 20 MG PO CPDR
20.0000 mg | DELAYED_RELEASE_CAPSULE | Freq: Every day | ORAL | 3 refills | Status: DC
Start: 2022-12-10 — End: 2023-02-18

## 2022-12-10 MED ORDER — ALLOPURINOL 100 MG PO TABS
100.0000 mg | ORAL_TABLET | Freq: Two times a day (BID) | ORAL | 2 refills | Status: DC
Start: 2022-12-10 — End: 2023-04-22

## 2022-12-10 MED ORDER — ACYCLOVIR 200 MG PO CAPS
200.0000 mg | ORAL_CAPSULE | Freq: Two times a day (BID) | ORAL | 2 refills | Status: DC
Start: 2022-12-10 — End: 2023-01-30

## 2022-12-13 DIAGNOSIS — C7951 Secondary malignant neoplasm of bone: Secondary | ICD-10-CM | POA: Diagnosis not present

## 2022-12-13 DIAGNOSIS — I272 Pulmonary hypertension, unspecified: Secondary | ICD-10-CM | POA: Diagnosis not present

## 2022-12-13 DIAGNOSIS — L89154 Pressure ulcer of sacral region, stage 4: Secondary | ICD-10-CM | POA: Diagnosis not present

## 2022-12-13 DIAGNOSIS — G893 Neoplasm related pain (acute) (chronic): Secondary | ICD-10-CM | POA: Diagnosis not present

## 2022-12-13 DIAGNOSIS — E43 Unspecified severe protein-calorie malnutrition: Secondary | ICD-10-CM | POA: Diagnosis not present

## 2022-12-13 DIAGNOSIS — D630 Anemia in neoplastic disease: Secondary | ICD-10-CM | POA: Diagnosis not present

## 2022-12-13 DIAGNOSIS — I129 Hypertensive chronic kidney disease with stage 1 through stage 4 chronic kidney disease, or unspecified chronic kidney disease: Secondary | ICD-10-CM | POA: Diagnosis not present

## 2022-12-13 DIAGNOSIS — C9000 Multiple myeloma not having achieved remission: Secondary | ICD-10-CM | POA: Diagnosis not present

## 2022-12-13 DIAGNOSIS — I824Z2 Acute embolism and thrombosis of unspecified deep veins of left distal lower extremity: Secondary | ICD-10-CM | POA: Diagnosis not present

## 2022-12-16 ENCOUNTER — Encounter: Payer: Medicare PPO | Attending: Physician Assistant | Admitting: Physician Assistant

## 2022-12-16 DIAGNOSIS — G893 Neoplasm related pain (acute) (chronic): Secondary | ICD-10-CM | POA: Diagnosis not present

## 2022-12-16 DIAGNOSIS — L89153 Pressure ulcer of sacral region, stage 3: Secondary | ICD-10-CM | POA: Diagnosis not present

## 2022-12-16 DIAGNOSIS — D630 Anemia in neoplastic disease: Secondary | ICD-10-CM | POA: Diagnosis not present

## 2022-12-16 DIAGNOSIS — C9000 Multiple myeloma not having achieved remission: Secondary | ICD-10-CM | POA: Insufficient documentation

## 2022-12-16 DIAGNOSIS — I129 Hypertensive chronic kidney disease with stage 1 through stage 4 chronic kidney disease, or unspecified chronic kidney disease: Secondary | ICD-10-CM | POA: Diagnosis not present

## 2022-12-16 DIAGNOSIS — L89154 Pressure ulcer of sacral region, stage 4: Secondary | ICD-10-CM | POA: Diagnosis not present

## 2022-12-16 DIAGNOSIS — M6281 Muscle weakness (generalized): Secondary | ICD-10-CM | POA: Insufficient documentation

## 2022-12-16 DIAGNOSIS — E43 Unspecified severe protein-calorie malnutrition: Secondary | ICD-10-CM | POA: Diagnosis not present

## 2022-12-16 DIAGNOSIS — C7951 Secondary malignant neoplasm of bone: Secondary | ICD-10-CM | POA: Diagnosis not present

## 2022-12-16 DIAGNOSIS — I824Z2 Acute embolism and thrombosis of unspecified deep veins of left distal lower extremity: Secondary | ICD-10-CM | POA: Diagnosis not present

## 2022-12-16 DIAGNOSIS — I272 Pulmonary hypertension, unspecified: Secondary | ICD-10-CM | POA: Diagnosis not present

## 2022-12-16 NOTE — Progress Notes (Addendum)
[name] ([id number]) [id number] Page 1 of 9 Visit Report for 12/16/2022 Arrival Information Details Patient Name: Date of Service: [name], [name] 12/16/2022 12:15 PM Medical Record Number: [id number] Patient Account Number: [id number] Date of Birth/Sex: Treating RN: 1940/07/27 (82 y.o. [name] Primary Care Manases Etchison: Hillard Danker Other Clinician: Referring Tulio Facundo: Treating Yareni Creps/Extender: Madelynn Done Weeks in Treatment: 8 Visit Information History Since Last Visit Added or deleted any medications: No Patient Arrived: [name] Any new allergies or adverse reactions: No Arrival Time: 12:25 Has Dressing in Place as Prescribed: Yes Accompanied By: daughter in law Pain Present Now: No Transfer Assistance: None Patient Identification Verified: Yes Secondary Verification Process Completed: Yes Patient Requires Transmission-Based Precautions: No Patient Has Alerts: Yes Patient Alerts: Not Diabetic Electronic Signature(s) Signed: 12/16/2022 5:10:46 PM By: Midge Aver MSN RN CNS WTA Entered By: Midge Aver on 12/16/2022 09:26:48 -------------------------------------------------------------------------------- Clinic Level of Care Assessment Details Patient Name: Date of Service: [name] 12/16/2022 12:15 PM Medical Record Number: [id number] Patient Account Number: [id number] Date of Birth/Sex: Treating RN: 13-May-1940 (82 y.o. [name] Primary Care Otisha Spickler: Hillard Danker Other Clinician: Referring Audie Stayer: Treating Sisto Granillo/Extender: Madelynn Done Weeks in Treatment: 8 Clinic Level of Care Assessment Items TOOL 4 Quantity Score X- 1 0 Use when only an EandM is performed on FOLLOW-UP visit ASSESSMENTS - Nursing Assessment / Reassessment X- 1 10 Reassessment of Co-morbidities (includes updates in patient status) X- 1 5 Reassessment of Adherence to Treatment Plan ASSESSMENTS - Wound and Skin A ssessment /  Reassessment X - Simple Wound Assessment / Reassessment - one wound 1 5 [name], [name] ([id number]) [id number] Page 2 of 9 []  - 0 Complex Wound Assessment / Reassessment - multiple wounds []  - 0 Dermatologic / Skin Assessment (not related to wound area) ASSESSMENTS - Focused Assessment []  - 0 Circumferential Edema Measurements - multi extremities []  - 0 Nutritional Assessment / Counseling / Intervention []  - 0 Lower Extremity Assessment (monofilament, tuning fork, pulses) []  - 0 Peripheral Arterial Disease Assessment (using hand held doppler) ASSESSMENTS - Ostomy and/or Continence Assessment and Care []  - 0 Incontinence Assessment and Management []  - 0 Ostomy Care Assessment and Management (repouching, etc.) PROCESS - Coordination of Care X - Simple Patient / Family Education for ongoing care 1 15 []  - 0 Complex (extensive) Patient / Family Education for ongoing care X- 1 10 Staff obtains Chiropractor, Records, T Results / Process Orders est []  - 0 Staff telephones HHA, Nursing Homes / Clarify orders / etc []  - 0 Routine Transfer to another Facility (non-emergent condition) []  - 0 Routine Hospital Admission (non-emergent condition) []  - 0 New Admissions / Manufacturing engineer / Ordering NPWT Apligraf, etc. , []  - 0 Emergency Hospital Admission (emergent condition) X- 1 10 Simple Discharge Coordination []  - 0 Complex (extensive) Discharge Coordination PROCESS - Special Needs []  - 0 Pediatric / Minor Patient Management []  - 0 Isolation Patient Management []  - 0 Hearing / Language / Visual special needs []  - 0 Assessment of Community assistance (transportation, D/C planning, etc.) []  - 0 Additional assistance / Altered mentation []  - 0 Support Surface(s) Assessment (bed, cushion, seat, etc.) INTERVENTIONS - Wound Cleansing / Measurement X - Simple Wound Cleansing - one wound 1 5 []  - 0 Complex Wound Cleansing - multiple wounds X- 1  5 Wound Imaging (photographs - any number of wounds) []  - 0 Wound Tracing (instead of photographs) X- 1 5 Simple Wound Measurement - one wound []  -  0 Complex Wound Measurement - multiple wounds INTERVENTIONS - Wound Dressings []  - 0 Small Wound Dressing one or multiple wounds X- 1 15 Medium Wound Dressing one or multiple wounds []  - 0 Large Wound Dressing one or multiple wounds []  - 0 Application of Medications - topical []  - 0 Application of Medications - injection INTERVENTIONS - Miscellaneous []  - 0 External ear exam []  - 0 Specimen Collection (cultures, biopsies, blood, body fluids, etc.) []  - 0 Specimen(s) / Culture(s) sent or taken to Lab for analysis [name], [name] ([id number]) [id number] Page 3 of 9 []  - 0 Patient Transfer (multiple staff / Nurse, adult / Similar devices) []  - 0 Simple Staple / Suture removal (25 or less) []  - 0 Complex Staple / Suture removal (26 or more) []  - 0 Hypo / Hyperglycemic Management (close monitor of Blood Glucose) []  - 0 Ankle / Brachial Index (ABI) - do not check if billed separately X- 1 5 Vital Signs Has the patient been seen at the hospital within the last three years: Yes Total Score: 90 Level Of Care: New/Established - Level 3 Electronic Signature(s) Signed: 12/16/2022 5:10:46 PM By: Midge Aver MSN RN CNS WTA Entered By: Midge Aver on 12/16/2022 09:39:35 -------------------------------------------------------------------------------- Encounter Discharge Information Details Patient Name: Date of Service: [name] 12/16/2022 12:15 PM Medical Record Number: [id number] Patient Account Number: [id number] Date of Birth/Sex: Treating RN: 08-06-1940 (82 y.o. [name] Primary Care Kenderick Kobler: Hillard Danker Other Clinician: Referring Michaell Grider: Treating Jamol Ginyard/Extender: Madelynn Done Weeks in Treatment: 8 Encounter Discharge Information Items Discharge Condition: Stable Ambulatory  Status: Walker Discharge Destination: Home Transportation: Private Auto Accompanied By: daughter in law Schedule Follow-up Appointment: Yes Clinical Summary of Care: Electronic Signature(s) Signed: 12/16/2022 5:10:46 PM By: Midge Aver MSN RN CNS WTA Entered By: Midge Aver on 12/16/2022 09:40:46 -------------------------------------------------------------------------------- Lower Extremity Assessment Details Patient Name: Date of Service: [name], [name] 12/16/2022 12:15 PM Medical Record Number: [id number] Patient Account Number: [id number] Date of Birth/Sex: Treating RN: 1940/08/06 (82 y.o. [name] Primary Care Victorya Hillman: Hillard Danker Other Clinician: Pricilla Holm, Scharlene [name] ([id number]) [id number] Page 4 of 9 Referring Andretta Ergle: Treating Kynley Metzger/Extender: Madelynn Done Weeks in Treatment: 8 Electronic Signature(s) Signed: 12/16/2022 5:10:46 PM By: Midge Aver MSN RN CNS WTA Entered By: Midge Aver on 12/16/2022 09:33:57 -------------------------------------------------------------------------------- Multi Wound Chart Details Patient Name: Date of Service: [name] 12/16/2022 12:15 PM Medical Record Number: [id number] Patient Account Number: [id number] Date of Birth/Sex: Treating RN: 12-19-1940 (82 y.o. [name] Primary Care Jetta Murray: Hillard Danker Other Clinician: Referring Nannie Starzyk: Treating Dorraine Ellender/Extender: Madelynn Done Weeks in Treatment: 8 Vital Signs Height(in): Pulse(bpm): 57 Weight(lbs): Blood Pressure(mmHg): 144/51 Body Mass Index(BMI): Temperature(F): 98.0 Respiratory Rate(breaths/min): 18 [1:Photos:] [N/A:N/A] Midline Coccyx N/A N/A Wound Location: Pressure Injury N/A N/A Wounding Event: Pressure Ulcer N/A N/A Primary Etiology: Hypertension, Received N/A N/A Comorbid History: Chemotherapy 08/30/2022 N/A N/A Date Acquired: 8 N/A N/A Weeks of Treatment: Open N/A N/A Wound Status: No N/A  N/A Wound Recurrence: 3.5x2.5x1 N/A N/A Measurements L x W x D (cm) 6.872 N/A N/A A (cm) : rea 6.872 N/A N/A Volume (cm) : 84.20% N/A N/A % Reduction in A rea: 93.70% N/A N/A % 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 N/A N/A Classification: Medium N/A N/A Exudate A mount: Serosanguineous N/A N/A Exudate Type: red, brown N/A N/A Exudate Color: Medium (34-66%) N/A N/A Granulation A mount: Red, Pink N/A N/A Granulation Quality: Medium (34-66%) N/A N/A Necrotic A mount:  Fat Layer (Subcutaneous Tissue): Yes N/A N/A Exposed Structures: Bone: Yes Fascia: No Tendon: No Muscle: No Joint: No [place], Maurisha [name] ([id number]) [id number] Page 5 of 9 Treatment Notes Electronic Signature(s) Signed: 12/16/2022 5:10:46 PM By: Midge Aver MSN RN CNS WTA Entered By: Midge Aver on 12/16/2022 09:34:38 -------------------------------------------------------------------------------- Multi-Disciplinary Care Plan Details Patient Name: Date of Service: [name], [name] 12/16/2022 12:15 PM Medical Record Number: [id number] Patient Account Number: [id number] Date of Birth/Sex: Treating RN: 03/31/1941 (82 y.o. [name] Primary Care Heloise Gordan: Hillard Danker Other Clinician: Referring Ladarien Beeks: Treating Brendaliz Kuk/Extender: Madelynn Done Weeks in Treatment: 8 Active Inactive Pressure Nursing Diagnoses: Knowledge deficit related to causes and risk factors for pressure ulcer development Knowledge deficit related to management of pressures ulcers Goals: Patient will remain free from development of additional pressure ulcers Date Initiated: 10/21/2022 Date Inactivated: 11/25/2022 Target Resolution Date: 11/21/2022 Goal Status: Met Patient will remain free of pressure ulcers Date Initiated: 10/21/2022 Date Inactivated: 11/25/2022 Target Resolution Date: 11/21/2022 Goal Status: Met Patient/caregiver will verbalize risk factors for pressure ulcer development Date  Initiated: 10/21/2022 Target Resolution Date: 12/22/2022 Goal Status: Active Patient/caregiver will verbalize understanding of pressure ulcer management Date Initiated: 10/21/2022 Target Resolution Date: 12/22/2022 Goal Status: Active Interventions: Assess: immobility, friction, shearing, incontinence upon admission and as needed Assess offloading mechanisms upon admission and as needed Assess potential for pressure ulcer upon admission and as needed Provide education on pressure ulcers Notes: Wound/Skin Impairment Nursing Diagnoses: Impaired tissue integrity Knowledge deficit related to ulceration/compromised skin integrity Goals: Patient/caregiver will verbalize understanding of skin care regimen Date Initiated: 10/21/2022 Date Inactivated: 11/25/2022 Target Resolution Date: 11/21/2022 Goal Status: Met Ulcer/skin breakdown will have a volume reduction of 30% by week 4 Date Initiated: 10/21/2022 Date Inactivated: 11/25/2022 Target Resolution Date: 11/21/2022 Goal Status: Met Ulcer/skin breakdown will have a volume reduction of 50% by week 8 [name], [name] ([id number]) [id number] Page 6 of 9 Date Initiated: 10/21/2022 Target Resolution Date: 12/22/2022 Goal Status: Active Ulcer/skin breakdown will have a volume reduction of 80% by week 12 Date Initiated: 10/21/2022 Target Resolution Date: 01/21/2023 Goal Status: Active Ulcer/skin breakdown will heal within 14 weeks Date Initiated: 10/21/2022 Target Resolution Date: 02/04/2023 Goal Status: Active Interventions: Assess patient/caregiver ability to obtain necessary supplies Assess patient/caregiver ability to perform ulcer/skin care regimen upon admission and as needed Assess ulceration(s) every visit Provide education on ulcer and skin care Treatment Activities: Patient referred to home care : 10/21/2022 Skin care regimen initiated : 10/21/2022 Topical wound management initiated : 10/21/2022 Notes: Electronic  Signature(s) Signed: 12/16/2022 5:10:46 PM By: Midge Aver MSN RN CNS WTA Entered By: Midge Aver on 12/16/2022 09:39:54 -------------------------------------------------------------------------------- Pain Assessment Details Patient Name: Date of Service: [name] 12/16/2022 12:15 PM Medical Record Number: [id number] Patient Account Number: [id number] Date of Birth/Sex: Treating RN: 1940-10-09 (82 y.o. [name] Primary Care Calina Patrie: Hillard Danker Other Clinician: Referring Lovelace Cerveny: Treating Ceri Mayer/Extender: Madelynn Done Weeks in Treatment: 8 Active Problems Location of Pain Severity and Description of Pain Patient Has Paino No Site Locations Pain Management and Medication Current Pain Management: [name], [name] ([id number]) [id number] Page 7 of 9 Electronic Signature(s) Signed: 12/16/2022 5:10:46 PM By: Midge Aver MSN RN CNS WTA Entered By: Midge Aver on 12/16/2022 09:27:18 -------------------------------------------------------------------------------- Patient/Caregiver Education Details Patient Name: Date of Service: Myra Gianotti 9/9/2024andnbsp12:15 PM Medical Record Number: [id number] Patient Account Number: [id number] Date of Birth/Gender: Treating RN: 28-Dec-1940 (82 y.o. [name] Primary Care Physician: Hillard Danker Other Clinician:  Referring Physician: Treating Physician/Extender: Madelynn Done Weeks in Treatment: 8 Education Assessment Education Provided To: Patient Education Topics Provided Wound/Skin Impairment: Handouts: Caring for Your Ulcer Methods: Explain/Verbal Responses: State content correctly Electronic Signature(s) Signed: 12/16/2022 5:10:46 PM By: Midge Aver MSN RN CNS WTA Entered By: Midge Aver on 12/16/2022 09:40:06 -------------------------------------------------------------------------------- Wound Assessment Details Patient Name: Date of Service: [name], [name] 12/16/2022  12:15 PM Medical Record Number: [id number] Patient Account Number: [id number] Date of Birth/Sex: Treating RN: 09-14-1940 (82 y.o. [name] Primary Care Idamae Coccia: Hillard Danker Other Clinician: Referring Jaydis Duchene: Treating Ranbir Chew/Extender: Madelynn Done Weeks in Treatment: 8 Wound Status Wound Number: 1 Primary Etiology: Pressure Ulcer Wound Location: Midline Coccyx Wound Status: Open Wounding Event: Pressure Injury Comorbid History: Hypertension, Received Chemotherapy Date Acquired: 08/30/2022 Weeks Of Treatment: 8 [name], [name] ([id number]) [id number] Page 8 of 9 Clustered Wound: No Photos Wound Measurements Length: (cm) 3.5 Width: (cm) 2.5 Depth: (cm) 1 Area: (cm) 6.872 Volume: (cm) 6.872 % Reduction in Area: 84.2% % Reduction in Volume: 93.7% Wound Description Classification: Category/Stage IV Exudate Amount: Medium Exudate Type: Serosanguineous Exudate Color: red, brown Foul Odor After Cleansing: No Slough/Fibrino Yes Wound Bed Granulation Amount: Medium (34-66%) Exposed Structure Granulation Quality: Red, Pink Fascia Exposed: No Necrotic Amount: Medium (34-66%) Fat Layer (Subcutaneous Tissue) Exposed: Yes Tendon Exposed: No Muscle Exposed: No Joint Exposed: No Bone Exposed: Yes Treatment Notes Wound #1 (Coccyx) Wound Laterality: Midline Cleanser Dakin 16 (oz) 0.25 Discharge Instruction: Moisten gauze and place on the wound Normal Saline Discharge Instruction: Wash your hands with soap and water. Remove old dressing, discard into plastic bag and place into trash. Cleanse the wound with Normal Saline prior to applying a clean dressing using gauze sponges, not tissues or cotton balls. Do not scrub or use excessive force. Pat dry using gauze sponges, not tissue or cotton balls. Peri-Wound Care Topical Primary Dressing Gauze Discharge Instruction: As directed: dry, moistened with saline or moistened with Dakins  Solution Secondary Dressing ABD Pad 5x9 (in/in) Discharge Instruction: Cover with ABD pad Secured With Medipore T - 75M Medipore H Soft Cloth Surgical T ape ape, 2x2 (in/yd) Compression Wrap Compression Stockings Add-Ons Electronic Signature(s) Signed: 12/16/2022 5:10:46 PM By: Midge Aver MSN RN CNS WTA Entered By: Midge Aver on 12/16/2022 09:33:40 Wiegel, Arlett [name] ([id number]) [id number] Page 9 of 9 -------------------------------------------------------------------------------- Vitals Details Patient Name: Date of Service: [name], [name] 12/16/2022 12:15 PM Medical Record Number: [id number] Patient Account Number: [id number] Date of Birth/Sex: Treating RN: Aug 21, 1940 (82 y.o. [name] Primary Care Betty Daidone: Hillard Danker Other Clinician: Referring Acadia Thammavong: Treating Yamen Castrogiovanni/Extender: Madelynn Done Weeks in Treatment: 8 Vital Signs Time Taken: 12:26 Temperature (F): 98.0 Pulse (bpm): 57 Respiratory Rate (breaths/min): 18 Blood Pressure (mmHg): 144/51 Reference Range: 80 - 120 mg / dl Electronic Signature(s) Signed: 12/16/2022 5:10:46 PM By: Midge Aver MSN RN CNS WTA Entered By: Midge Aver on 12/16/2022 09:27:10

## 2022-12-16 NOTE — Progress Notes (Signed)
ALSBURY, Maurine B ([id number]) [id number] Page 1 of 7 Visit Report for 12/16/2022 Chief Complaint Document Details Patient Name: Date of Service: PAETYNN, FEURTADO [place] B. 12/16/2022 12:15 PM Medical Record Number: [id number] Patient Account Number: [id number] Date of Birth/Sex: Treating RN: 06/07/1940 (82 y.o. [name] Primary Care Provider: Hillard Danker Other Clinician: Referring Provider: Treating Provider/Extender: Madelynn Done Weeks in Treatment: 8 Information Obtained from: Patient Chief Complaint Stage 4 pressure ulcer Electronic Signature(s) Signed: 12/16/2022 12:21:01 PM By: Allen Derry PA-C Entered By: Allen Derry on 12/16/2022 09:21:01 -------------------------------------------------------------------------------- HPI Details Patient Name: Date of Service: KHALEESIA, BRIEGER NN B. 12/16/2022 12:15 PM Medical Record Number: [id number] Patient Account Number: [id number] Date of Birth/Sex: Treating RN: 1940/04/10 (82 y.o. 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 Currently there is not direct bone exposure because she is now covered with granulation tissue her family has been taking care of her at home at this point although she was previously in a facility. This is something that was noted when she ended up having to go to the ER initially on Aug 30, 2022. Subsequently during hospital stent it was started that she had gentamicin applied topically along with Dakin's moistened gauze packing which has been done at home now and seems to be doing very well. She was at [facility] skilled nursing  facility for time. She did have Levaquin previous which she did do very well for her as far as get the infection under control in my opinion I think she is actually seeming to do quite well at this point. She is not having any pain which is also excellent news at this time. She is starting to get around more so she is not completely bedbound although she is a little weak she does have family that is very attentive and taking great care of her. 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 There is an area of bone that is very thinly covered. Been using Dakin's wet-to-dry dressings. She has in-home caregivers 24/7 and also home health coming out to change dressings/participate in dressing supervision although most of her dressing changes are being done by her own in-home caregivers. 11-04-2022 upon evaluation today patient appears to be doing well currently in regard to her wound. With that being said I do feel like she is making really good progress towards healing currently. I do not see any evidence of active infection locally nor systemically which is excellent news as well. No fevers, chills, Koci, Nikky B ([id number]) [id number] Page 2 of 7 nausea, vomiting, or diarrhea. 11-12-2022 patient's wound today appears to be doing okay although to be honest I am a little concerned about the fact that the wound VAC was not put on properly. It was not bridged she was laying on the tubing and this course was not very comfortable for her. Fortunately I do not see any signs of active infection locally or systemically which is great news. No fevers, chills, nausea, vomiting, or diarrhea. 11-18-2022 upon evaluation today patient appears to be doing well  currently in regard to her wound. This is actually showing  signs of improvement wound VAC was put on much more eloquently this time compared to last I am actually very pleased. He does not like there may be some irritation around the edges of the wound I think this may be subsequent to an issue going on with infection. We can look into trying to determine what this may be and then adjusting medications as needed to take care of that infection if so. 11-25-2022 upon evaluation today patient appears to be doing well currently in regard to her wound which is actually showing signs of improvement feeling little by little. The irritation around the edges of the wound is better and the wound VAC seems to have been doing well until he ran out of the DuoDERM and apparently last time this was put on the DuoDERM was not utilized. Again I think that is something that has been extremely beneficial for keeping this wound VAC in place without any complications in the past. 8/26; patient comes in with home health changing the wound VAC 3 times a week Monday Wednesday and Friday. Per our intake nurse the tunneling area superiorly has close down nicely. Progress has been good. She did not come in with a wound VAC on, they apparently took it off so she could be a but home health is coming in later today to change it. There is some concerns about next Monday being a holiday with regards to wound VAC change by home health 12-16-2022 upon evaluation today patient appears to be doing well currently in regard to her wound. She has been tolerating the dressing changes without complication. Fortunately there does not appear to be any signs of active infection locally or systemically which is great news. No fevers, chills, nausea, vomiting, or diarrhea. Electronic Signature(s) Signed: 12/16/2022 12:44:48 PM By: Allen Derry PA-C Entered By: Allen Derry on 12/16/2022 09:44:48 -------------------------------------------------------------------------------- Physical Exam Details Patient  Name: Date of Service: GIOVANA, FEHLMAN NN B. 12/16/2022 12:15 PM Medical Record Number: [id number] Patient Account Number: [id number] Date of Birth/Sex: Treating RN: 03-29-1941 (82 y.o. [name] Primary Care Provider: Hillard Danker Other Clinician: Referring Provider: Treating Provider/Extender: Madelynn Done Weeks in Treatment: 8 Constitutional Well-nourished and well-hydrated in no acute distress. Respiratory normal breathing without difficulty. Psychiatric this patient is able to make decisions and demonstrates good insight into disease process. Alert and Oriented x 3. pleasant and cooperative. Notes Patient upon inspection has a dramatically improved wound which is actually showing signs of significant change in a good direction based on what I am seeing today. On top of this she seems to be ambulating much better which is also excellent news and in general I think that she is making really good headway towards complete closure. In fact we may even be within a week of stopping the wound VAC release putting on hold to ensure that she can do okay for week before completely discontinuing. Electronic Signature(s) Signed: 12/16/2022 12:45:27 PM By: Allen Derry PA-C Entered By: Allen Derry on 12/16/2022 09:45:27 Hallinan, Paraskevi B ([id number]) [id number] Page 3 of 7 -------------------------------------------------------------------------------- Physician Orders Details Patient Name: Date of Service: ROGELIA, NOLTON [place] B. 12/16/2022 12:15 PM Medical Record Number: [id number] Patient Account Number: [id number] Date of Birth/Sex: Treating RN: 12-02-1940 (82 y.o. [name] Primary Care Provider: Hillard Danker Other Clinician: Referring Provider: Treating Provider/Extender: Madelynn Done Weeks in Treatment: 8 Verbal / Phone Orders: No Diagnosis Coding ICD-10 Coding Code Description  L89.154 Pressure ulcer of sacral region, stage 4 M62.81 Muscle  weakness (generalized) C90.00 Multiple myeloma not having achieved remission Follow-up Appointments Return Appointment in 1 week. Home Health Home Health Company: - [facility] CONTINUE Home Health for wound care. May utilize formulary equivalent dressing for wound treatment orders unless otherwise specified. Home Health Nurse may visit PRN to address patients wound care needs. - [facility]--Remove dressing and clean the wound with normal saline, use skin barrier wipes prior to apply the drape. Apply wound vac foam and use the Duoderm (may cut in a U shape). Please use drape to outline the wound prior to placing the foam dressing in the wound. Please bridge the vac from the wound up to the hip to prevent lying on the tubing. Must use foam over the drape to bridge. Please see photo sent with patient and link for video. [email or web address] Change MWF. Patient will come to wound center on Monday. We will remove vac and HH RN will need to come in the afternoon to replace the vac. Please call if you have any questions. Patient must be lying in bed. Scheduled days for dressing changes to be completed; exception, patient has scheduled wound care visit that day. **Please direct any NON-WOUND related issues/requests for orders to patient's Primary Care Physician. **If current dressing causes regression in wound condition, may D/C ordered dressing product/s and apply Normal Saline Moist Dressing daily until next Wound Healing Center or Other MD appointment. **Notify Wound Healing Center of regression in wound condition at [phone]. Bathing/ Shower/ Hygiene May shower; gently cleanse wound with antibacterial soap, rinse and pat dry prior to dressing wounds No tub bath. Anesthetic (Use 'Patient Medications' Section for Anesthetic Order Entry) Lidocaine applied to wound bed Off-Loading Low air-loss mattress (Group 2) Turn and reposition every 2 hours Negative Pressure Wound  Therapy Other: - NPWT to be applied on Monday 11/04/22 by [facility] nurse Wound Treatment Wound #1 - Coccyx Wound Laterality: Midline Cleanser: Dakin 16 (oz) 0.25 (Home Health) 1 x Per Day/30 Days Discharge Instructions: Moisten gauze and place on the wound Cleanser: Normal Saline (Home Health) 1 x Per Day/30 Days Discharge Instructions: Wash your hands with soap and water. Remove old dressing, discard into plastic bag and place into trash. Cleanse the wound with Normal Saline prior to applying a clean dressing using gauze sponges, not tissues or cotton balls. Do not scrub or use excessive force. Pat dry using gauze sponges, not tissue or cotton balls. Prim Dressing: Gauze (Home Health) 1 x Per Day/30 Days ary Discharge Instructions: As directed: dry, moistened with saline or moistened with Dakins Solution Secondary Dressing: ABD Pad 5x9 (in/in) (Home Health) 1 x Per Day/30 Days Discharge Instructions: Cover with ABD pad Secured With: Medipore T - 70M Medipore H Soft Cloth Surgical T ape ape, 2x2 (in/yd) (Home Health) 1 x Per Day/30 Days Veras, Sadiyah B ([id number]) [id number] Page 4 of 7 Electronic Signature(s) Unsigned Entered By: Midge Aver on 12/16/2022 09:38:58 -------------------------------------------------------------------------------- Problem List Details Patient Name: Date of Service: TIMBERLYN, SHIPPEE 12/16/2022 12:15 PM Medical Record Number: [id number] Patient Account Number: [id number] Date of Birth/Sex: Treating RN: May 09, 1940 (82 y.o. [name] Primary Care Provider: Hillard Danker Other Clinician: Referring Provider: Treating Provider/Extender: Madelynn Done Weeks in Treatment: 8 Active Problems ICD-10 Encounter Code Description Active Date MDM Diagnosis L89.154 Pressure ulcer of sacral region, stage 4 10/21/2022 No Yes M62.81 Muscle weakness (generalized) 10/21/2022 No Yes C90.00 Multiple myeloma not having achieved remission  10/21/2022 No Yes  Inactive Problems Resolved Problems Electronic Signature(s) Signed: 12/16/2022 12:20:57 PM By: Allen Derry PA-C Entered By: Allen Derry on 12/16/2022 09:20:57 Progress Note Details -------------------------------------------------------------------------------- Shaaron Adler ([id number]) [id number] Page 5 of 7 Patient Name: Date of Service: SHARLETT, GRAMLEY [place] B. 12/16/2022 12:15 PM Medical Record Number: [id number] Patient Account Number: [id number] Date of Birth/Sex: Treating RN: 13-Jul-1940 (82 y.o. 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 Currently there is not direct bone exposure because she is now covered with granulation tissue her family has been taking care of her at home at this point although she was previously in a facility. This is something that was noted when she ended up having to go to the ER initially on Aug 30, 2022. Subsequently during hospital stent it was started that she had gentamicin applied topically along with Dakin's moistened gauze packing which has been done at home now and seems to be doing very well. She was at [facility] skilled nursing facility for time. She did have Levaquin previous which she did do very well for her as far as get the infection under control in my opinion I think she is actually seeming to do quite well at this point. She is not having any pain which is also excellent news at this time. She is starting to get around more so she is not completely bedbound although  she is a little weak she does have family that is very attentive and taking great care of her. 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 There is an area of bone that is very thinly covered. Been using Dakin's wet-to-dry dressings. She has in-home caregivers 24/7 and also home health coming out to change dressings/participate in dressing supervision although most of her dressing changes are being done by her own in-home caregivers. 11-04-2022 upon evaluation today patient appears to be doing well currently in regard to her wound. With that being said I do feel like she is making really good progress towards healing currently. I do not see any evidence of active infection locally nor systemically which is excellent news as well. No fevers, chills, nausea, vomiting, or diarrhea. 11-12-2022 patient's wound today appears to be doing okay although to be honest I am a little concerned about the fact that the wound VAC was not put on properly. It was not bridged she was laying on the tubing and this course was not very comfortable for her. Fortunately I do not see any signs of active infection locally or systemically which is great news. No fevers, chills, nausea, vomiting, or diarrhea. 11-18-2022 upon evaluation today patient appears to be doing well currently in regard to her wound. This is actually showing signs of improvement wound VAC was put on much more eloquently this time compared to last I am actually very pleased. He does not like there may be some irritation around the edges of the wound I think this may be subsequent to an issue going on with infection. We can look  into trying to determine what this may be and then adjusting medications as needed to take care of that infection if so. 11-25-2022 upon evaluation today patient  appears to be doing well currently in regard to her wound which is actually showing signs of improvement feeling little by little. The irritation around the edges of the wound is better and the wound VAC seems to have been doing well until he ran out of the DuoDERM and apparently last time this was put on the DuoDERM was not utilized. Again I think that is something that has been extremely beneficial for keeping this wound VAC in place without any complications in the past. 8/26; patient comes in with home health changing the wound VAC 3 times a week Monday Wednesday and Friday. Per our intake nurse the tunneling area superiorly has close down nicely. Progress has been good. She did not come in with a wound VAC on, they apparently took it off so she could be a but home health is coming in later today to change it. There is some concerns about next Monday being a holiday with regards to wound VAC change by home health 12-16-2022 upon evaluation today patient appears to be doing well currently in regard to her wound. She has been tolerating the dressing changes without complication. Fortunately there does not appear to be any signs of active infection locally or systemically which is great news. No fevers, chills, nausea, vomiting, or diarrhea. Objective Constitutional Well-nourished and well-hydrated in no acute distress. Vitals Time Taken: 12:26 PM, Temperature: 98.0 F, Pulse: 57 bpm, Respiratory Rate: 18 breaths/min, Blood Pressure: 144/51 mmHg. Respiratory normal breathing without difficulty. Psychiatric this patient is able to make decisions and demonstrates good insight into disease process. Alert and Oriented x 3. pleasant and cooperative. General Notes: Patient upon inspection has a dramatically improved wound which is actually showing signs of significant change in a good direction based on what I am seeing today. On top of this she seems to be ambulating much better which is also excellent  news and in general I think that she is making really good headway towards complete closure. In fact we may even be within a week of stopping the wound VAC release putting on hold to ensure that she can do okay for week before completely discontinuing. Integumentary (Hair, Skin) Wound #1 status is Open. Original cause of wound was Pressure Injury. The date acquired was: 08/30/2022. The wound has been in treatment 8 weeks. The wound is located on the Midline Coccyx. The wound measures 3.5cm length x 2.5cm width x 1cm depth; 6.872cm^2 area and 6.872cm^3 volume. There is bone and Fat Layer (Subcutaneous Tissue) exposed. There is a medium amount of serosanguineous drainage noted. There is medium (34-66%) red, pink granulation Harpham, Adelin B ([id number]) [id number] Page 6 of 7 within the wound bed. There is a medium (34-66%) amount of necrotic tissue within the wound bed. Assessment Active Problems ICD-10 Pressure ulcer of sacral region, stage 4 Muscle weakness (generalized) Multiple myeloma not having achieved remission Plan Follow-up Appointments: Return Appointment in 1 week. Home Health: Home Health Company: - [facility] CONTINUE Home Health for wound care. May utilize formulary equivalent dressing for wound treatment orders unless otherwise specified. Home Health Nurse may visit PRN to address patients wound care needs. - [facility]--Remove dressing and clean the wound with normal saline, use skin barrier wipes prior to apply the drape. Apply wound vac foam and use the Duoderm (may cut in a U shape). Please use  drape to outline the wound prior to placing the foam dressing in the wound. Please bridge the vac from the wound up to the hip to prevent lying on the tubing. Must use foam over the drape to bridge. Please see photo sent with patient and link for video. [email or web address] Change MWF. Patient will come to wound center on Monday. We  will remove vac and HH RN will need to come in the afternoon to replace the vac. Please call if you have any questions. Patient must be lying in bed. Scheduled days for dressing changes to be completed; exception, patient has scheduled wound care visit that day. **Please direct any NON-WOUND related issues/requests for orders to patient's Primary Care Physician. **If current dressing causes regression in wound condition, may D/C ordered dressing product/s and apply Normal Saline Moist Dressing daily until next Wound Healing Center or Other MD appointment. **Notify Wound Healing Center of regression in wound condition at [phone]. Bathing/ Shower/ Hygiene: May shower; gently cleanse wound with antibacterial soap, rinse and pat dry prior to dressing wounds No tub bath. Anesthetic (Use 'Patient Medications' Section for Anesthetic Order Entry): Lidocaine applied to wound bed Off-Loading: Low air-loss mattress (Group 2) Turn and reposition every 2 hours Negative Pressure Wound Therapy: Other: - NPWT to be applied on Monday 11/04/22 by [facility] nurse WOUND #1: - Coccyx Wound Laterality: Midline Cleanser: Dakin 16 (oz) 0.25 (Home Health) 1 x Per Day/30 Days Discharge Instructions: Moisten gauze and place on the wound Cleanser: Normal Saline (Home Health) 1 x Per Day/30 Days Discharge Instructions: Wash your hands with soap and water. Remove old dressing, discard into plastic bag and place into trash. Cleanse the wound with Normal Saline prior to applying a clean dressing using gauze sponges, not tissues or cotton balls. Do not scrub or use excessive force. Pat dry using gauze sponges, not tissue or cotton balls. Prim Dressing: Gauze (Home Health) 1 x Per Day/30 Days ary Discharge Instructions: As directed: dry, moistened with saline or moistened with Dakins Solution Secondary Dressing: ABD Pad 5x9 (in/in) (Home Health) 1 x Per Day/30 Days Discharge Instructions: Cover with ABD pad Secured  With: Medipore T - 38M Medipore H Soft Cloth Surgical T ape ape, 2x2 (in/yd) (Home Health) 1 x Per Day/30 Days 1. I am a recommend that we have the patient continue to monitor for any evidence of infection or worsening. Based on what I am seeing I do believe that we are making good headway towards complete closure. 2. I would recommend we continue the wound VAC for this week by next week I think it will probably be time to put this on hold possibly even completely stopping it. We will see patient back for reevaluation in 1 week here in the clinic. If anything worsens or changes patient will contact our office for additional recommendations. Electronic Signature(s) Signed: 12/16/2022 12:45:58 PM By: Allen Derry PA-C Entered By: Allen Derry on 12/16/2022 09:45:58 Stegmaier, Laketra B ([id number]) [id number] Page 7 of 7 -------------------------------------------------------------------------------- SuperBill Details Patient Name: Date of Service: SILKA, LITTELL 12/16/2022 Medical Record Number: [id number] Patient Account Number: [id number] Date of Birth/Sex: Treating RN: November 28, 1940 (82 y.o. [name] Primary Care Provider: Hillard Danker Other Clinician: Referring Provider: Treating Provider/Extender: Madelynn Done Weeks in Treatment: 8 Diagnosis Coding ICD-10 Codes Code Description [phone] Pressure ulcer of sacral region, stage 4 M62.81 Muscle weakness (generalized) C90.00 Multiple myeloma not having achieved remission Facility Procedures : 7 CPT4 Code: [id number] Description: 99213 - WOUND CARE VISIT-LEV  3 EST PT Modifier: Quantity: 1 Physician Procedures : CPT4 Code Description Modifier [id number] 99213 - WC PHYS LEVEL 3 - EST PT ICD-10 Diagnosis Description L89.154 Pressure ulcer of sacral region, stage 4 M62.81 Muscle weakness (generalized) C90.00 Multiple myeloma not having achieved remission Quantity: 1 Electronic Signature(s) Signed: 12/16/2022  12:51:53 PM By: Allen Derry PA-C Entered By: Allen Derry on 12/16/2022 09:51:53

## 2022-12-18 ENCOUNTER — Inpatient Hospital Stay: Payer: Medicare PPO | Admitting: Internal Medicine

## 2022-12-18 ENCOUNTER — Inpatient Hospital Stay: Payer: Medicare PPO

## 2022-12-18 ENCOUNTER — Telehealth: Payer: Self-pay | Admitting: Medical Oncology

## 2022-12-18 ENCOUNTER — Inpatient Hospital Stay: Payer: Medicare PPO | Attending: Internal Medicine

## 2022-12-18 DIAGNOSIS — I129 Hypertensive chronic kidney disease with stage 1 through stage 4 chronic kidney disease, or unspecified chronic kidney disease: Secondary | ICD-10-CM | POA: Diagnosis not present

## 2022-12-18 DIAGNOSIS — C9000 Multiple myeloma not having achieved remission: Secondary | ICD-10-CM

## 2022-12-18 DIAGNOSIS — E46 Unspecified protein-calorie malnutrition: Secondary | ICD-10-CM | POA: Diagnosis not present

## 2022-12-18 DIAGNOSIS — Z7961 Long term (current) use of immunomodulator: Secondary | ICD-10-CM | POA: Insufficient documentation

## 2022-12-18 DIAGNOSIS — L89154 Pressure ulcer of sacral region, stage 4: Secondary | ICD-10-CM | POA: Diagnosis not present

## 2022-12-18 DIAGNOSIS — D630 Anemia in neoplastic disease: Secondary | ICD-10-CM | POA: Diagnosis not present

## 2022-12-18 DIAGNOSIS — Z79899 Other long term (current) drug therapy: Secondary | ICD-10-CM | POA: Diagnosis not present

## 2022-12-18 DIAGNOSIS — C7951 Secondary malignant neoplasm of bone: Secondary | ICD-10-CM | POA: Diagnosis not present

## 2022-12-18 DIAGNOSIS — G893 Neoplasm related pain (acute) (chronic): Secondary | ICD-10-CM | POA: Diagnosis not present

## 2022-12-18 DIAGNOSIS — D649 Anemia, unspecified: Secondary | ICD-10-CM | POA: Insufficient documentation

## 2022-12-18 DIAGNOSIS — I272 Pulmonary hypertension, unspecified: Secondary | ICD-10-CM | POA: Diagnosis not present

## 2022-12-18 DIAGNOSIS — Z5112 Encounter for antineoplastic immunotherapy: Secondary | ICD-10-CM | POA: Insufficient documentation

## 2022-12-18 DIAGNOSIS — Z79624 Long term (current) use of inhibitors of nucleotide synthesis: Secondary | ICD-10-CM | POA: Diagnosis not present

## 2022-12-18 DIAGNOSIS — I824Z2 Acute embolism and thrombosis of unspecified deep veins of left distal lower extremity: Secondary | ICD-10-CM | POA: Diagnosis not present

## 2022-12-18 DIAGNOSIS — E43 Unspecified severe protein-calorie malnutrition: Secondary | ICD-10-CM | POA: Diagnosis not present

## 2022-12-18 LAB — CBC WITH DIFFERENTIAL (CANCER CENTER ONLY)
Abs Immature Granulocytes: 0.1 10*3/uL — ABNORMAL HIGH (ref 0.00–0.07)
Basophils Absolute: 0.1 10*3/uL (ref 0.0–0.1)
Basophils Relative: 1 %
Eosinophils Absolute: 0.6 10*3/uL — ABNORMAL HIGH (ref 0.0–0.5)
Eosinophils Relative: 6 %
HCT: 30.1 % — ABNORMAL LOW (ref 36.0–46.0)
Hemoglobin: 10.2 g/dL — ABNORMAL LOW (ref 12.0–15.0)
Immature Granulocytes: 1 %
Lymphocytes Relative: 29 %
Lymphs Abs: 2.7 10*3/uL (ref 0.7–4.0)
MCH: 34.7 pg — ABNORMAL HIGH (ref 26.0–34.0)
MCHC: 33.9 g/dL (ref 30.0–36.0)
MCV: 102.4 fL — ABNORMAL HIGH (ref 80.0–100.0)
Monocytes Absolute: 1.1 10*3/uL — ABNORMAL HIGH (ref 0.1–1.0)
Monocytes Relative: 12 %
Neutro Abs: 4.9 10*3/uL (ref 1.7–7.7)
Neutrophils Relative %: 51 %
Platelet Count: 194 10*3/uL (ref 150–400)
RBC: 2.94 MIL/uL — ABNORMAL LOW (ref 3.87–5.11)
RDW: 17.1 % — ABNORMAL HIGH (ref 11.5–15.5)
WBC Count: 9.5 10*3/uL (ref 4.0–10.5)
nRBC: 0 % (ref 0.0–0.2)

## 2022-12-18 LAB — CMP (CANCER CENTER ONLY)
ALT: 26 U/L (ref 0–44)
AST: 18 U/L (ref 15–41)
Albumin: 3.2 g/dL — ABNORMAL LOW (ref 3.5–5.0)
Alkaline Phosphatase: 161 U/L — ABNORMAL HIGH (ref 38–126)
Anion gap: 8 (ref 5–15)
BUN: 19 mg/dL (ref 8–23)
CO2: 26 mmol/L (ref 22–32)
Calcium: 8.6 mg/dL — ABNORMAL LOW (ref 8.9–10.3)
Chloride: 107 mmol/L (ref 98–111)
Creatinine: 0.61 mg/dL (ref 0.44–1.00)
GFR, Estimated: >60 mL/min (ref >60)
Glucose, Bld: 127 mg/dL — ABNORMAL HIGH (ref 70–99)
Potassium: 3.5 mmol/L (ref 3.5–5.1)
Sodium: 141 mmol/L (ref 135–145)
Total Bilirubin: 0.4 mg/dL (ref 0.3–1.2)
Total Protein: 5.1 g/dL — ABNORMAL LOW (ref 6.5–8.1)

## 2022-12-18 LAB — LACTATE DEHYDROGENASE: LDH: 186 U/L (ref 98–192)

## 2022-12-18 LAB — SAMPLE TO BLOOD BANK

## 2022-12-18 MED ORDER — DARATUMUMAB-HYALURONIDASE-FIHJ 1800-30000 MG-UT/15ML ~~LOC~~ SOLN
1800.0000 mg | Freq: Once | SUBCUTANEOUS | Status: AC
  Administered 2022-12-18: 1800 mg via SUBCUTANEOUS
  Filled 2022-12-18: qty 15

## 2022-12-18 MED ORDER — ACETAMINOPHEN 325 MG PO TABS
650.0000 mg | ORAL_TABLET | Freq: Once | ORAL | Status: AC
  Administered 2022-12-18: 650 mg via ORAL
  Filled 2022-12-18: qty 2

## 2022-12-18 MED ORDER — DIPHENHYDRAMINE HCL 25 MG PO CAPS
50.0000 mg | ORAL_CAPSULE | Freq: Once | ORAL | Status: AC
  Administered 2022-12-18: 50 mg via ORAL
  Filled 2022-12-18: qty 2

## 2022-12-18 MED ORDER — DEXAMETHASONE 4 MG PO TABS
20.0000 mg | ORAL_TABLET | Freq: Once | ORAL | Status: AC
  Administered 2022-12-18: 20 mg via ORAL
  Filled 2022-12-18: qty 5

## 2022-12-18 NOTE — Progress Notes (Signed)
Unable to do patient's zometa today due to time constraints - dental paperwork is in order for her next appt.

## 2022-12-18 NOTE — Patient Instructions (Signed)

## 2022-12-18 NOTE — Progress Notes (Signed)
[facility] [phone]   [phone]  OFFICE PROGRESS NOTE  [name], MD 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 Dorris Fetch on January 28, 2022.    CURRENT THERAPY:  1) Starting systemic therapy with Revlimid 21 days on 7 days off, Darzalex, and 20 mg of Decadron weekly.  First dose expected next week on 09/11/2022.  Status post 3 cycles.  2) Monthly Zometa injections for 6 to 12 months, followed by every 3 months thereon after once dental clearance is obtained.  INTERVAL HISTORY: [name] 82 y.o. female returns to the clinic today for follow-up visit.  The patient is feeling fine today with no concerning complaints except for the mild fatigue and recent right shoulder pain.  She denied having any chest pain, shortness of breath, cough or hemoptysis.  She has no nausea, vomiting, diarrhea or constipation.  She has no headache or visual changes.  She denied having any recent weight loss or night sweats.  She actually gained around 6 pounds from her last visit.  She is here today for evaluation before starting day 15 of cycle #4 of her treatment.  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 by echo 10/2021   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/2015   Carotid artery stenosis    < 50% stenosis bilaterally by doppler 07/2016   Coronary artery disease  2008   Coronary Ca score of 331 with minimal multivessel plaque < 25% stenosis by coronary CTA 8/23   Heart murmur    per pt   Hypercholesteremia    Hypertension    Obesity    Osteopenia    declines treatment   Pneumonia 1995   Pulmonary HTN (HCC)    mild to moderate by echo 7/23 with PASP   Shoulder pain    Due to arthritis   Vitamin D insufficiency     ALLERGIES:  is allergic to crestor [rosuvastatin calcium], lactose, lipitor [atorvastatin], pravastatin, simvastatin, tramadol, zetia [ezetimibe], codeine, penicillins, sulfa antibiotics, and vancomycin.  MEDICATIONS:  Current Outpatient Medications  Medication Sig Dispense Refill   acetaminophen (TYLENOL) 325 MG tablet Take 2 tablets (650 mg total) by mouth every 8 (eight) hours as needed.     acyclovir (ZOVIRAX) 200 MG capsule Take 1 capsule (200 mg total) by mouth 2 (two) times daily. 30 capsule 2   allopurinol (ZYLOPRIM) 100 MG tablet Take 1 tablet (100 mg total) by mouth 2 (two) times daily. 60 tablet 2   ascorbic acid (VITAMIN C) 500 MG tablet Take 1 tablet (500 mg total) by mouth daily.     daratumumab-hyaluronidase-fihj (DARZALEX FASPRO) 1800-30000 MG-UT/15ML SOLN Inject 1,800 mg into the skin once.     dexamethasone (DECADRON) 4 MG tablet Please take 5 tablets (20 mg) weekly on the days of treatment  40 tablet 3   gentamicin ointment (GARAMYCIN) 0.1 % Apply 1 Application topically daily.     HYDROcodone-acetaminophen (NORCO/VICODIN) 5-325 MG tablet Take 1 tablet by mouth every 6 (six) hours as needed for moderate pain.     lenalidomide (REVLIMID) 15 MG capsule TAKE 1 CAPSULE (15MG ) BY MOUTH DAILY 21 capsule 0   magic mouthwash SOLN Take 5 mLs by mouth 4 (four) times daily. Swish and spit 240 mL 0   Multiple Vitamin (MULTIVITAMIN WITH MINERALS) TABS tablet Take 1 tablet by mouth daily.     naloxone (NARCAN) nasal spray 4 mg/0.1 mL      nystatin cream (MYCOSTATIN) Apply 1 Application topically 2 (two) times daily.      omeprazole (PRILOSEC) 20 MG capsule Take 1 capsule (20 mg total) by mouth daily. 30 capsule 3   oxycodone (OXY-IR) 5 MG capsule Take 5 mg by mouth every 6 (six) hours.     sodium hypochlorite (DAKIN'S 1/4 STRENGTH) 0.125 % SOLN 1 application Externally Once a day for 30 days     No current facility-administered medications for this visit.    SURGICAL HISTORY:  Past Surgical History:  Procedure Laterality Date   AORTIC VALVE REPLACEMENT N/A 10/14/2013   Procedure: AORTIC VALVE REPLACEMENT (AVR);  Surgeon: Alleen Borne, MD;  Location: [facility] OR;  Service: Open Heart Surgery;  Laterality: N/A;   CARDIAC CATHETERIZATION     CATARACT EXTRACTION, BILATERAL     CHOLECYSTECTOMY  04/08/1988   ESOPHAGOGASTRODUODENOSCOPY (EGD) WITH PROPOFOL N/A 09/05/2022   Procedure: ESOPHAGOGASTRODUODENOSCOPY (EGD) WITH PROPOFOL;  Surgeon: Kathi Der, MD;  Location: [facility] ENDOSCOPY;  Service: Gastroenterology;  Laterality: N/A;   INTRAOPERATIVE TRANSESOPHAGEAL ECHOCARDIOGRAM N/A 10/14/2013   Procedure: INTRAOPERATIVE TRANSESOPHAGEAL ECHOCARDIOGRAM;  Surgeon: Alleen Borne, MD;  Location: [facility] OR;  Service: Open Heart Surgery;  Laterality: N/A;   IVC FILTER INSERTION N/A 09/06/2022   Procedure: IVC FILTER INSERTION;  Surgeon: Leonie Douglas, MD;  Location: [facility] INVASIVE CV LAB;  Service: Cardiovascular;  Laterality: N/A;   KYPHOPLASTY Bilateral 03/27/2022   Procedure: KYPHOPLASTY AND BIOPSY THORACIC ELEVEN;  Surgeon: Lisbeth Renshaw, MD;  Location: [facility] OR;  Service: Neurosurgery;  Laterality: Bilateral;   LEFT AND RIGHT HEART CATHETERIZATION WITH CORONARY ANGIOGRAM N/A 09/16/2013   Procedure: LEFT AND RIGHT HEART CATHETERIZATION WITH CORONARY ANGIOGRAM;  Surgeon: Quintella Reichert, MD;  Location: [facility] CATH LAB;  Service: Cardiovascular;  Laterality: N/A;   TONSILLECTOMY      REVIEW OF SYSTEMS:  A comprehensive review of systems was negative except for: Constitutional: positive for fatigue Musculoskeletal: positive for  arthralgias   PHYSICAL EXAMINATION: General appearance: alert, cooperative, fatigued, and no distress Head: Normocephalic, without obvious abnormality, atraumatic Neck: no adenopathy, no JVD, supple, symmetrical, trachea midline, and thyroid not enlarged, symmetric, no tenderness/mass/nodules Lymph nodes: Cervical, supraclavicular, and axillary nodes normal. Resp: clear to auscultation bilaterally Back: symmetric, no curvature. ROM normal. No CVA tenderness. Cardio: regular rate and rhythm, S1, S2 normal, no murmur, click, rub or gallop GI: soft, non-tender; bowel sounds normal; no masses,  no organomegaly Extremities: extremities normal, atraumatic, no cyanosis or edema  ECOG PERFORMANCE STATUS: 1 - Symptomatic but completely ambulatory  There were no vitals taken for this visit.  LABORATORY DATA: Lab Results  Component Value Date   WBC 9.5 12/18/2022   HGB 10.2 (L) 12/18/2022   HCT 30.1 (L) 12/18/2022   MCV 102.4 (H) 12/18/2022   PLT 194 12/18/2022      Chemistry      Component Value  Date/Time   NA 141 12/18/2022 0959   NA 141 10/06/2020 0939   K 3.5 12/18/2022 0959   CL 107 12/18/2022 0959   CO2 26 12/18/2022 0959   BUN 19 12/18/2022 0959   BUN 17 10/06/2020 0939   CREATININE 0.61 12/18/2022 0959   CREATININE 0.85 01/29/2016 0910      Component Value Date/Time   CALCIUM 8.6 (L) 12/18/2022 0959   ALKPHOS 161 (H) 12/18/2022 0959   AST 18 12/18/2022 0959   ALT 26 12/18/2022 0959   BILITOT 0.4 12/18/2022 0959       RADIOGRAPHIC STUDIES: No results found.  ASSESSMENT AND PLAN: This is a very pleasant 82 years old white female with  1) history of stage I (T1a, N0, M0) thymoma type AB diagnosed in October 2023 status post resection under the care of Dr. Dorris Fetch on January 28, 2022 with close resection margin and microscopic infiltration of the capsule. 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  The patient is undergoing systemic therapy  with daratumumab, Revlimid 15 mg p.o. daily for 21 days every 4 weeks in addition to Decadron 20 mg weekly with the treatment.  Status post 3 cycles.   The patient has been tolerating this treatment well with no concerning adverse effects. I recommended for her to proceed with day 15 of cycle #4 today as planned. For the anemia, she has improvement of her hemoglobin and hematocrit.  There is no need for transfusion today. For the malnutrition, she will continue with the nutritional supplements. She will come back for follow-up visit in 2 weeks for evaluation before starting day 1 of cycle #5. The patient was advised to call immediately if she has any other concerning symptoms in the interval. The patient voices understanding of current disease status and treatment options and is in agreement with the current care plan.  All questions were answered. The patient knows to call the clinic with any problems, questions or concerns. We can certainly see the patient much sooner if necessary.  The total time spent in the appointment was 20 minutes.  Disclaimer: This note was dictated with voice recognition software. Similar sounding words can inadvertently be transcribed and may not be corrected upon review.

## 2022-12-18 NOTE — Telephone Encounter (Signed)
Dental clearance letter faxed to pharmacy. Copy given to pt /son.

## 2022-12-20 DIAGNOSIS — E43 Unspecified severe protein-calorie malnutrition: Secondary | ICD-10-CM | POA: Diagnosis not present

## 2022-12-20 DIAGNOSIS — C7951 Secondary malignant neoplasm of bone: Secondary | ICD-10-CM | POA: Diagnosis not present

## 2022-12-20 DIAGNOSIS — G893 Neoplasm related pain (acute) (chronic): Secondary | ICD-10-CM | POA: Diagnosis not present

## 2022-12-20 DIAGNOSIS — C9000 Multiple myeloma not having achieved remission: Secondary | ICD-10-CM | POA: Diagnosis not present

## 2022-12-20 DIAGNOSIS — L89154 Pressure ulcer of sacral region, stage 4: Secondary | ICD-10-CM | POA: Diagnosis not present

## 2022-12-20 DIAGNOSIS — D630 Anemia in neoplastic disease: Secondary | ICD-10-CM | POA: Diagnosis not present

## 2022-12-20 DIAGNOSIS — I272 Pulmonary hypertension, unspecified: Secondary | ICD-10-CM | POA: Diagnosis not present

## 2022-12-20 DIAGNOSIS — I129 Hypertensive chronic kidney disease with stage 1 through stage 4 chronic kidney disease, or unspecified chronic kidney disease: Secondary | ICD-10-CM | POA: Diagnosis not present

## 2022-12-20 DIAGNOSIS — I824Z2 Acute embolism and thrombosis of unspecified deep veins of left distal lower extremity: Secondary | ICD-10-CM | POA: Diagnosis not present

## 2022-12-22 ENCOUNTER — Other Ambulatory Visit: Payer: Self-pay | Admitting: Internal Medicine

## 2022-12-22 DIAGNOSIS — C9000 Multiple myeloma not having achieved remission: Secondary | ICD-10-CM

## 2022-12-23 ENCOUNTER — Encounter: Payer: Medicare PPO | Admitting: Physician Assistant

## 2022-12-23 ENCOUNTER — Telehealth: Payer: Self-pay | Admitting: *Deleted

## 2022-12-23 DIAGNOSIS — M6281 Muscle weakness (generalized): Secondary | ICD-10-CM | POA: Diagnosis not present

## 2022-12-23 DIAGNOSIS — L89154 Pressure ulcer of sacral region, stage 4: Secondary | ICD-10-CM | POA: Diagnosis not present

## 2022-12-23 DIAGNOSIS — C9000 Multiple myeloma not having achieved remission: Secondary | ICD-10-CM | POA: Diagnosis not present

## 2022-12-23 DIAGNOSIS — L89153 Pressure ulcer of sacral region, stage 3: Secondary | ICD-10-CM | POA: Diagnosis not present

## 2022-12-23 NOTE — Telephone Encounter (Signed)
Son states they are going to PCP to be tested.

## 2022-12-23 NOTE — Telephone Encounter (Signed)
Son states that [name] is still having diarrhea. His sister did some research and saw that taking Omeprezole and an antibiotic at the same time can cause C-Diff. They are asking to get her tested . Please advise.

## 2022-12-23 NOTE — Progress Notes (Signed)
currently in regard to her wound. This is actually showing  signs of improvement wound VAC was put on much more eloquently this time compared to last I am actually very pleased. He does not like there may be some irritation around the edges of the wound I think this may be subsequent to an issue going on with infection. We can look into trying to determine what this may be and then adjusting medications as needed to take care of that infection if so. 11-25-2022 upon evaluation today patient appears to be doing well currently in regard to her wound which is actually showing signs of improvement feeling little by little. The irritation around the edges of the wound is better and the wound VAC seems to have been doing well until he ran out of the DuoDERM and apparently last time this was put on the DuoDERM was not utilized. Again I think that is something that has been extremely beneficial for keeping this wound VAC in place without any complications in the past. 8/26; patient comes in with home health changing the wound VAC 3 times a week Monday Wednesday and Friday. Per our intake nurse the tunneling area superiorly has close down nicely. Progress has been good. She did not come in with a wound VAC on, they apparently took it off so she could be a but home health is coming in later today to change it. There is some concerns about next Monday being a holiday with regards to wound VAC change by home health 12-16-2022 upon evaluation today patient appears to be doing well currently in regard to her wound. She has been tolerating the dressing changes without complication. Fortunately there does not appear to be any signs of active infection locally or systemically which is great news. No fevers, chills, nausea, vomiting, or diarrhea. 12-23-2022 upon evaluation today patient appears to be doing well currently in regard to her sacral wound. This is actually showing signs of significant improvement. Fortunately I do not see any evidence of worsening this has filled in  quite nicely and really there is no significant depth to the wound is pretty much flush. I do believe that she may benefit from discontinuation of the VAC on the right least. On hold this week and see where things stand she is in agreement with that plan. Electronic Signature(s) Signed: 12/23/2022 12:46:04 PM By: Allen Derry PA-C Entered By: Allen Derry on 12/23/2022 09:46:04 -------------------------------------------------------------------------------- Physical Exam Details Patient Name: Date of Service: RAAHI, BEDROSSIAN [name] 12/23/2022 12:15 PM Medical Record Number: [id number] Patient Account Number: [id number] Date of Birth/Sex: Treating RN: 07/04/1940 (82 y.o. [name] Primary Care Provider: Hillard Danker Other Clinician: Referring Provider: Treating Provider/Extender: Madelynn Done Weeks in Treatment: 9 Constitutional Well-nourished and well-hydrated in no acute distress. Respiratory normal breathing without difficulty. Psychiatric this patient is able to make decisions and demonstrates good insight into disease process. Alert and Oriented x 3. pleasant and cooperative. Notes Upon inspection patient's wound bed showed signs of good granulation and epithelization at this point. I am actually very pleased there is a little bit of hypergranulation but at the same time I feel like she is really doing quite well. I think that Hydrofera Blue would be an excellent option going forward at this time and I discussed that with the patient today as well. Electronic Signature(s) Signed: 12/23/2022 12:46:25 PM By: Allen Derry PA-C Entered By: Allen Derry on 12/23/2022 09:46:25 Galant, Karlene [name] ([id number]) [id number] Page 3 of 7 --------------------------------------------------------------------------------  Date of Birth/Sex: Treating  RN: [birth year] (82 y.o. [name] Primary Care Provider: Hillard Danker Other Clinician: Referring Provider: Treating Provider/Extender: Madelynn Done Weeks in Treatment: 9 Active Problems ICD-10 Encounter Code Description Active Date MDM Diagnosis L89.154 Pressure ulcer of sacral region, stage 4 10/21/2022 No Yes M62.81 Muscle weakness (generalized) 10/21/2022 No Yes C90.00 Multiple myeloma not having achieved remission 10/21/2022 No Yes Inactive Problems Resolved Problems Electronic Signature(s) Signed: 12/23/2022 12:28:56 PM By: Allen Derry PA-C Entered By: Allen Derry on 12/23/2022 09:28:56 -------------------------------------------------------------------------------- Progress Note Details Patient Name: Date of Service: [name] Number [name] 12/23/2022 12:15 PM Medical Record Number: [id number] Patient Account Number: [id number] Date of Birth/Sex: Treating RN: [birth year] (82 y.o. 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 Currently there is not direct bone exposure because she is now covered with granulation tissue her family has been taking care of her at home at this point although she was previously in a facility. This is something that was noted when she ended up having to go to the ER initially on Aug 30, 2022. Subsequently during hospital stent it was started that she had gentamicin applied topically along with Dakin's moistened gauze packing which has  been done at home now and seems to be doing very well. She was at [facility] skilled nursing facility for time. She did have Levaquin previous which she did do very well for her as far as get the infection under control in my opinion I think she is actually seeming to do quite well at this point. She is not having any pain which is also excellent news at this time. She is starting to get around more so she is not completely bedbound although she is a little weak she does have family that is very attentive and taking great care of her. 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 There is an area of bone that is very thinly covered. Been using Dakin's wet-to-dry dressings. She has in-home caregivers 24/7 and also home health coming out to change dressings/participate in dressing supervision although most of her dressing changes are being done by her own in-home caregivers. 11-04-2022 upon evaluation today patient appears to be doing well currently in regard to her wound. With that being said I do feel like she is making really good progress towards healing currently. I do not see any evidence of active infection locally nor systemically which is excellent news as well. No fevers, chills, nausea, vomiting, or diarrhea. 11-12-2022 patient's wound today appears to be doing okay although to be honest I am a little concerned about the fact that the wound VAC was not put on properly. It was not bridged she was laying on the tubing and this course was not very comfortable for her. Fortunately I do not see any signs of active infection locally or systemically which is great news. No fevers, chills, nausea, vomiting, or diarrhea. 11-18-2022 upon evaluation  currently in regard to her wound. This is actually showing  signs of improvement wound VAC was put on much more eloquently this time compared to last I am actually very pleased. He does not like there may be some irritation around the edges of the wound I think this may be subsequent to an issue going on with infection. We can look into trying to determine what this may be and then adjusting medications as needed to take care of that infection if so. 11-25-2022 upon evaluation today patient appears to be doing well currently in regard to her wound which is actually showing signs of improvement feeling little by little. The irritation around the edges of the wound is better and the wound VAC seems to have been doing well until he ran out of the DuoDERM and apparently last time this was put on the DuoDERM was not utilized. Again I think that is something that has been extremely beneficial for keeping this wound VAC in place without any complications in the past. 8/26; patient comes in with home health changing the wound VAC 3 times a week Monday Wednesday and Friday. Per our intake nurse the tunneling area superiorly has close down nicely. Progress has been good. She did not come in with a wound VAC on, they apparently took it off so she could be a but home health is coming in later today to change it. There is some concerns about next Monday being a holiday with regards to wound VAC change by home health 12-16-2022 upon evaluation today patient appears to be doing well currently in regard to her wound. She has been tolerating the dressing changes without complication. Fortunately there does not appear to be any signs of active infection locally or systemically which is great news. No fevers, chills, nausea, vomiting, or diarrhea. 12-23-2022 upon evaluation today patient appears to be doing well currently in regard to her sacral wound. This is actually showing signs of significant improvement. Fortunately I do not see any evidence of worsening this has filled in  quite nicely and really there is no significant depth to the wound is pretty much flush. I do believe that she may benefit from discontinuation of the VAC on the right least. On hold this week and see where things stand she is in agreement with that plan. Electronic Signature(s) Signed: 12/23/2022 12:46:04 PM By: Allen Derry PA-C Entered By: Allen Derry on 12/23/2022 09:46:04 -------------------------------------------------------------------------------- Physical Exam Details Patient Name: Date of Service: RAAHI, BEDROSSIAN [name] 12/23/2022 12:15 PM Medical Record Number: [id number] Patient Account Number: [id number] Date of Birth/Sex: Treating RN: 07/04/1940 (82 y.o. [name] Primary Care Provider: Hillard Danker Other Clinician: Referring Provider: Treating Provider/Extender: Madelynn Done Weeks in Treatment: 9 Constitutional Well-nourished and well-hydrated in no acute distress. Respiratory normal breathing without difficulty. Psychiatric this patient is able to make decisions and demonstrates good insight into disease process. Alert and Oriented x 3. pleasant and cooperative. Notes Upon inspection patient's wound bed showed signs of good granulation and epithelization at this point. I am actually very pleased there is a little bit of hypergranulation but at the same time I feel like she is really doing quite well. I think that Hydrofera Blue would be an excellent option going forward at this time and I discussed that with the patient today as well. Electronic Signature(s) Signed: 12/23/2022 12:46:25 PM By: Allen Derry PA-C Entered By: Allen Derry on 12/23/2022 09:46:25 Galant, Karlene [name] ([id number]) [id number] Page 3 of 7 --------------------------------------------------------------------------------  Date of Birth/Sex: Treating  RN: [birth year] (82 y.o. [name] Primary Care Provider: Hillard Danker Other Clinician: Referring Provider: Treating Provider/Extender: Madelynn Done Weeks in Treatment: 9 Active Problems ICD-10 Encounter Code Description Active Date MDM Diagnosis L89.154 Pressure ulcer of sacral region, stage 4 10/21/2022 No Yes M62.81 Muscle weakness (generalized) 10/21/2022 No Yes C90.00 Multiple myeloma not having achieved remission 10/21/2022 No Yes Inactive Problems Resolved Problems Electronic Signature(s) Signed: 12/23/2022 12:28:56 PM By: Allen Derry PA-C Entered By: Allen Derry on 12/23/2022 09:28:56 -------------------------------------------------------------------------------- Progress Note Details Patient Name: Date of Service: [name] Number [name] 12/23/2022 12:15 PM Medical Record Number: [id number] Patient Account Number: [id number] Date of Birth/Sex: Treating RN: [birth year] (82 y.o. 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 Currently there is not direct bone exposure because she is now covered with granulation tissue her family has been taking care of her at home at this point although she was previously in a facility. This is something that was noted when she ended up having to go to the ER initially on Aug 30, 2022. Subsequently during hospital stent it was started that she had gentamicin applied topically along with Dakin's moistened gauze packing which has  been done at home now and seems to be doing very well. She was at [facility] skilled nursing facility for time. She did have Levaquin previous which she did do very well for her as far as get the infection under control in my opinion I think she is actually seeming to do quite well at this point. She is not having any pain which is also excellent news at this time. She is starting to get around more so she is not completely bedbound although she is a little weak she does have family that is very attentive and taking great care of her. 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 There is an area of bone that is very thinly covered. Been using Dakin's wet-to-dry dressings. She has in-home caregivers 24/7 and also home health coming out to change dressings/participate in dressing supervision although most of her dressing changes are being done by her own in-home caregivers. 11-04-2022 upon evaluation today patient appears to be doing well currently in regard to her wound. With that being said I do feel like she is making really good progress towards healing currently. I do not see any evidence of active infection locally nor systemically which is excellent news as well. No fevers, chills, nausea, vomiting, or diarrhea. 11-12-2022 patient's wound today appears to be doing okay although to be honest I am a little concerned about the fact that the wound VAC was not put on properly. It was not bridged she was laying on the tubing and this course was not very comfortable for her. Fortunately I do not see any signs of active infection locally or systemically which is great news. No fevers, chills, nausea, vomiting, or diarrhea. 11-18-2022 upon evaluation  patient should continue with the Skin-Prep around the edges of the wound just for protection sake before putting the bordered foam dressing over to cover over the [facility]. 3. I am also going to recommend that the patient should continue to utilize the continued appropriate offloading I think that still to be of utmost importance and the patient is in agreement with the plan. We will see patient back for reevaluation in 1 week here in the clinic. If anything worsens or changes patient will contact our office for  additional recommendations. Electronic Signature(s) Signed: 12/23/2022 12:47:14 PM By: Allen Derry PA-C Entered By: Allen Derry on 12/23/2022 09:47:13 Urey, Liba [name] ([id number]) [id number] Page 7 of 7 -------------------------------------------------------------------------------- SuperBill Details Patient Name: Date of Service: [name], HOLIMAN [name] 12/23/2022 Medical Record Number: [id number] Patient Account Number: [id number] Date of Birth/Sex: Treating RN: May 22, 1940 (82 y.o. 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.00 Multiple myeloma not having achieved remission Facility Procedures : 7 CPT4 Code: [id number] Description: 99213 - WOUND CARE VISIT-LEV 3 EST PT Modifier: Quantity: 1 Physician Procedures : CPT4 Code Description Modifier [id number] 99213 - WC PHYS LEVEL 3 - EST PT ICD-10 Diagnosis Description L89.154 Pressure ulcer of sacral region, stage 4 M62.81 Muscle weakness (generalized) C90.00 Multiple myeloma not having achieved remission Quantity: 1 Electronic Signature(s) Unsigned Previous Signature: 12/23/2022 12:47:28 PM Version By: Allen Derry PA-C Entered By: Midge Aver on 12/23/2022 09:52:15 Signature(s): Date(s):  Date of Birth/Sex: Treating  RN: [birth year] (82 y.o. [name] Primary Care Provider: Hillard Danker Other Clinician: Referring Provider: Treating Provider/Extender: Madelynn Done Weeks in Treatment: 9 Active Problems ICD-10 Encounter Code Description Active Date MDM Diagnosis L89.154 Pressure ulcer of sacral region, stage 4 10/21/2022 No Yes M62.81 Muscle weakness (generalized) 10/21/2022 No Yes C90.00 Multiple myeloma not having achieved remission 10/21/2022 No Yes Inactive Problems Resolved Problems Electronic Signature(s) Signed: 12/23/2022 12:28:56 PM By: Allen Derry PA-C Entered By: Allen Derry on 12/23/2022 09:28:56 -------------------------------------------------------------------------------- Progress Note Details Patient Name: Date of Service: [name] Number [name] 12/23/2022 12:15 PM Medical Record Number: [id number] Patient Account Number: [id number] Date of Birth/Sex: Treating RN: [birth year] (82 y.o. 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 Currently there is not direct bone exposure because she is now covered with granulation tissue her family has been taking care of her at home at this point although she was previously in a facility. This is something that was noted when she ended up having to go to the ER initially on Aug 30, 2022. Subsequently during hospital stent it was started that she had gentamicin applied topically along with Dakin's moistened gauze packing which has  been done at home now and seems to be doing very well. She was at [facility] skilled nursing facility for time. She did have Levaquin previous which she did do very well for her as far as get the infection under control in my opinion I think she is actually seeming to do quite well at this point. She is not having any pain which is also excellent news at this time. She is starting to get around more so she is not completely bedbound although she is a little weak she does have family that is very attentive and taking great care of her. 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 There is an area of bone that is very thinly covered. Been using Dakin's wet-to-dry dressings. She has in-home caregivers 24/7 and also home health coming out to change dressings/participate in dressing supervision although most of her dressing changes are being done by her own in-home caregivers. 11-04-2022 upon evaluation today patient appears to be doing well currently in regard to her wound. With that being said I do feel like she is making really good progress towards healing currently. I do not see any evidence of active infection locally nor systemically which is excellent news as well. No fevers, chills, nausea, vomiting, or diarrhea. 11-12-2022 patient's wound today appears to be doing okay although to be honest I am a little concerned about the fact that the wound VAC was not put on properly. It was not bridged she was laying on the tubing and this course was not very comfortable for her. Fortunately I do not see any signs of active infection locally or systemically which is great news. No fevers, chills, nausea, vomiting, or diarrhea. 11-18-2022 upon evaluation  currently in regard to her wound. This is actually showing  signs of improvement wound VAC was put on much more eloquently this time compared to last I am actually very pleased. He does not like there may be some irritation around the edges of the wound I think this may be subsequent to an issue going on with infection. We can look into trying to determine what this may be and then adjusting medications as needed to take care of that infection if so. 11-25-2022 upon evaluation today patient appears to be doing well currently in regard to her wound which is actually showing signs of improvement feeling little by little. The irritation around the edges of the wound is better and the wound VAC seems to have been doing well until he ran out of the DuoDERM and apparently last time this was put on the DuoDERM was not utilized. Again I think that is something that has been extremely beneficial for keeping this wound VAC in place without any complications in the past. 8/26; patient comes in with home health changing the wound VAC 3 times a week Monday Wednesday and Friday. Per our intake nurse the tunneling area superiorly has close down nicely. Progress has been good. She did not come in with a wound VAC on, they apparently took it off so she could be a but home health is coming in later today to change it. There is some concerns about next Monday being a holiday with regards to wound VAC change by home health 12-16-2022 upon evaluation today patient appears to be doing well currently in regard to her wound. She has been tolerating the dressing changes without complication. Fortunately there does not appear to be any signs of active infection locally or systemically which is great news. No fevers, chills, nausea, vomiting, or diarrhea. 12-23-2022 upon evaluation today patient appears to be doing well currently in regard to her sacral wound. This is actually showing signs of significant improvement. Fortunately I do not see any evidence of worsening this has filled in  quite nicely and really there is no significant depth to the wound is pretty much flush. I do believe that she may benefit from discontinuation of the VAC on the right least. On hold this week and see where things stand she is in agreement with that plan. Electronic Signature(s) Signed: 12/23/2022 12:46:04 PM By: Allen Derry PA-C Entered By: Allen Derry on 12/23/2022 09:46:04 -------------------------------------------------------------------------------- Physical Exam Details Patient Name: Date of Service: RAAHI, BEDROSSIAN [name] 12/23/2022 12:15 PM Medical Record Number: [id number] Patient Account Number: [id number] Date of Birth/Sex: Treating RN: 07/04/1940 (82 y.o. [name] Primary Care Provider: Hillard Danker Other Clinician: Referring Provider: Treating Provider/Extender: Madelynn Done Weeks in Treatment: 9 Constitutional Well-nourished and well-hydrated in no acute distress. Respiratory normal breathing without difficulty. Psychiatric this patient is able to make decisions and demonstrates good insight into disease process. Alert and Oriented x 3. pleasant and cooperative. Notes Upon inspection patient's wound bed showed signs of good granulation and epithelization at this point. I am actually very pleased there is a little bit of hypergranulation but at the same time I feel like she is really doing quite well. I think that Hydrofera Blue would be an excellent option going forward at this time and I discussed that with the patient today as well. Electronic Signature(s) Signed: 12/23/2022 12:46:25 PM By: Allen Derry PA-C Entered By: Allen Derry on 12/23/2022 09:46:25 Galant, Karlene [name] ([id number]) [id number] Page 3 of 7 --------------------------------------------------------------------------------

## 2022-12-25 ENCOUNTER — Other Ambulatory Visit: Payer: Self-pay | Admitting: Medical Oncology

## 2022-12-25 DIAGNOSIS — L89154 Pressure ulcer of sacral region, stage 4: Secondary | ICD-10-CM | POA: Diagnosis not present

## 2022-12-25 DIAGNOSIS — C9000 Multiple myeloma not having achieved remission: Secondary | ICD-10-CM | POA: Diagnosis not present

## 2022-12-25 DIAGNOSIS — D630 Anemia in neoplastic disease: Secondary | ICD-10-CM | POA: Diagnosis not present

## 2022-12-25 DIAGNOSIS — E43 Unspecified severe protein-calorie malnutrition: Secondary | ICD-10-CM | POA: Diagnosis not present

## 2022-12-25 DIAGNOSIS — C7951 Secondary malignant neoplasm of bone: Secondary | ICD-10-CM | POA: Diagnosis not present

## 2022-12-25 DIAGNOSIS — I824Z2 Acute embolism and thrombosis of unspecified deep veins of left distal lower extremity: Secondary | ICD-10-CM | POA: Diagnosis not present

## 2022-12-25 DIAGNOSIS — I129 Hypertensive chronic kidney disease with stage 1 through stage 4 chronic kidney disease, or unspecified chronic kidney disease: Secondary | ICD-10-CM | POA: Diagnosis not present

## 2022-12-25 DIAGNOSIS — I272 Pulmonary hypertension, unspecified: Secondary | ICD-10-CM | POA: Diagnosis not present

## 2022-12-25 DIAGNOSIS — G893 Neoplasm related pain (acute) (chronic): Secondary | ICD-10-CM | POA: Diagnosis not present

## 2022-12-25 MED ORDER — LENALIDOMIDE 15 MG PO CAPS
15.0000 mg | ORAL_CAPSULE | Freq: Every day | ORAL | 0 refills | Status: AC
Start: 2022-12-25 — End: 2023-01-15

## 2022-12-25 NOTE — Progress Notes (Signed)
Called in [employer] auth number.

## 2022-12-26 NOTE — Progress Notes (Signed)
[name], [name] ([id number]) [id number] Page 1 of 9 Visit Report for 12/23/2022 Arrival Information Details Patient Name: Date of Service: [name], [name] [name] 12/23/2022 12:15 PM Medical Record Number: [id number] Patient Account Number: [id number] Date of Birth/Sex: Treating RN: [birth year] (82 y.o. [name] Primary Care [name]: [name] Other Clinician: Referring [name]: Treating [name]/Extender: [name] Weeks in Treatment: 9 Visit Information History Since Last Visit Added or deleted any medications: No Patient Arrived: [name] Any new allergies or adverse reactions: No Arrival Time: 12:21 Has Dressing in Place as Prescribed: Yes Accompanied By: daughter in law Pain Present Now: No Transfer Assistance: None Patient Identification Verified: Yes Secondary Verification Process Completed: Yes Patient Requires Transmission-Based Precautions: No Patient Has Alerts: Yes Patient Alerts: Not Diabetic Electronic Signature(s) Signed: 12/25/2022 5:31:20 PM By: [name] Aver MSN RN CNS WTA Entered By: [name] on 12/23/2022 09:22:01 -------------------------------------------------------------------------------- Clinic Level of Care Assessment Details Patient Name: Date of Service: [name], [name] 12/23/2022 12:15 PM Medical Record Number: [id number] Patient Account Number: [id number] Date of Birth/Sex: Treating RN: 04/20/1940 (82 y.o. [name] Primary Care Marvene Strohm: [name] Other Clinician: Referring Avyana Puffenbarger: Treating [name]/Extender: [name] Weeks in Treatment: 9 Clinic Level of Care Assessment Items TOOL 4 Quantity Score X- 1 0 Use when only an EandM is performed on FOLLOW-UP visit ASSESSMENTS - Nursing Assessment / Reassessment X- 1 10 Reassessment of Co-morbidities (includes updates in patient status) X- 1 5 Reassessment of Adherence to Treatment Plan ASSESSMENTS - Wound and Skin A ssessment /  Reassessment X - Simple Wound Assessment / Reassessment - one wound 1 5 [name], [name] ([id number]) [id number] Page 2 of 9 []  - 0 Complex Wound Assessment / Reassessment - multiple wounds []  - 0 Dermatologic / Skin Assessment (not related to wound area) ASSESSMENTS - Focused Assessment []  - 0 Circumferential Edema Measurements - multi extremities []  - 0 Nutritional Assessment / Counseling / Intervention []  - 0 Lower Extremity Assessment (monofilament, tuning fork, pulses) []  - 0 Peripheral Arterial Disease Assessment (using hand held doppler) ASSESSMENTS - Ostomy and/or Continence Assessment and Care []  - 0 Incontinence Assessment and Management []  - 0 Ostomy Care Assessment and Management (repouching, etc.) PROCESS - Coordination of Care X - Simple Patient / Family Education for ongoing care 1 15 []  - 0 Complex (extensive) Patient / Family Education for ongoing care X- 1 10 Staff obtains Chiropractor, Records, T Results / Process Orders est []  - 0 Staff telephones HHA, Nursing Homes / Clarify orders / etc []  - 0 Routine Transfer to another Facility (non-emergent condition) []  - 0 Routine Hospital Admission (non-emergent condition) []  - 0 New Admissions / Manufacturing engineer / Ordering NPWT Apligraf, etc. , []  - 0 Emergency Hospital Admission (emergent condition) X- 1 10 Simple Discharge Coordination []  - 0 Complex (extensive) Discharge Coordination PROCESS - Special Needs []  - 0 Pediatric / Minor Patient Management []  - 0 Isolation Patient Management []  - 0 Hearing / Language / Visual special needs []  - 0 Assessment of Community assistance (transportation, D/C planning, etc.) []  - 0 Additional assistance / Altered mentation []  - 0 Support Surface(s) Assessment (bed, cushion, seat, etc.) INTERVENTIONS - Wound Cleansing / Measurement X - Simple Wound Cleansing - one wound 1 5 []  - 0 Complex Wound Cleansing - multiple wounds X- 1  5 Wound Imaging (photographs - any number of wounds) []  - 0 Wound Tracing (instead of photographs) X- 1 5 Simple Wound Measurement - one wound []  -  0 Complex Wound Measurement - multiple wounds INTERVENTIONS - Wound Dressings []  - 0 Small Wound Dressing one or multiple wounds X- 1 15 Medium Wound Dressing one or multiple wounds []  - 0 Large Wound Dressing one or multiple wounds []  - 0 Application of Medications - topical []  - 0 Application of Medications - injection INTERVENTIONS - Miscellaneous []  - 0 External ear exam []  - 0 Specimen Collection (cultures, biopsies, blood, body fluids, etc.) []  - 0 Specimen(s) / Culture(s) sent or taken to Lab for analysis [name], [name] ([id number]) [id number] Page 3 of 9 []  - 0 Patient Transfer (multiple staff / Nurse, adult / Similar devices) []  - 0 Simple Staple / Suture removal (25 or less) []  - 0 Complex Staple / Suture removal (26 or more) []  - 0 Hypo / Hyperglycemic Management (close monitor of Blood Glucose) []  - 0 Ankle / Brachial Index (ABI) - do not check if billed separately X- 1 5 Vital Signs Has the patient been seen at the hospital within the last three years: Yes Total Score: 90 Level Of Care: New/Established - Level 3 Electronic Signature(s) Signed: 12/25/2022 5:31:20 PM By: [name] Aver MSN RN CNS WTA Entered By: [name] on 12/23/2022 09:52:05 -------------------------------------------------------------------------------- Encounter Discharge Information Details Patient Name: Date of Service: [name] 12/23/2022 12:15 PM Medical Record Number: [id number] Patient Account Number: [id number] Date of Birth/Sex: Treating RN: Oct 25, 1940 (82 y.o. [name] Primary Care [name]: [name] Other Clinician: Referring [name]: Treating [name]/Extender: [name] Weeks in Treatment: 9 Encounter Discharge Information Items Discharge Condition: Stable Ambulatory  Status: Walker Discharge Destination: Home Transportation: Private Auto Accompanied By: daughter in law Schedule Follow-up Appointment: Yes Clinical Summary of Care: Electronic Signature(s) Signed: 12/25/2022 5:31:20 PM By: [name] Aver MSN RN CNS WTA Entered By: [name] on 12/23/2022 09:53:24 -------------------------------------------------------------------------------- Lower Extremity Assessment Details Patient Name: Date of Service: [name], [name] 12/23/2022 12:15 PM Medical Record Number: [id number] Patient Account Number: [id number] Date of Birth/Sex: Treating RN: 11/29/1940 (82 y.o. [name] Primary Care Celester Lech: [name] Other Clinician: Pricilla Holm, Mallori [name] ([id number]) [id number] Page 4 of 9 Referring Jaslyne Beeck: Treating Davonte Siebenaler/Extender: [name] Weeks in Treatment: 9 Electronic Signature(s) Signed: 12/25/2022 5:31:20 PM By: [name] Aver MSN RN CNS WTA Entered By: [name] on 12/23/2022 09:31:07 -------------------------------------------------------------------------------- Multi Wound Chart Details Patient Name: Date of Service: [name] 12/23/2022 12:15 PM Medical Record Number: [id number] Patient Account Number: [id number] Date of Birth/Sex: Treating RN: [birth year] (82 y.o. [name] Primary Care Chundra Sauerwein: [name] Other Clinician: Referring Geran Haithcock: Treating Ja Pistole/Extender: [name] Weeks in Treatment: 9 Vital Signs Height(in): Pulse(bpm): 61 Weight(lbs): Blood Pressure(mmHg): 141/55 Body Mass Index(BMI): Temperature(F): 98.2 Respiratory Rate(breaths/min): 18 [1:Photos:] [N/A:N/A] Midline Coccyx N/A N/A Wound Location: Pressure Injury N/A N/A Wounding Event: Pressure Ulcer N/A N/A Primary Etiology: Hypertension, Received N/A N/A Comorbid History: Chemotherapy 08/30/2022 N/A N/A Date Acquired: 9 N/A N/A Weeks of Treatment: Open N/A N/A Wound Status: No N/A  N/A Wound Recurrence: 3.5x1.7x0.8 N/A N/A Measurements L x W x D (cm) 4.673 N/A N/A A (cm) : rea 3.738 N/A N/A Volume (cm) : 89.20% N/A N/A % Reduction in A rea: 96.60% N/A N/A % 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 N/A N/A Classification: Medium N/A N/A Exudate A mount: Serosanguineous N/A N/A Exudate Type: red, brown N/A N/A Exudate Color: Medium (34-66%) N/A N/A Granulation A mount: Red, Pink N/A N/A Granulation Quality: Medium (34-66%) N/A N/A Necrotic A mount:  0 Complex Wound Measurement - multiple wounds INTERVENTIONS - Wound Dressings []  - 0 Small Wound Dressing one or multiple wounds X- 1 15 Medium Wound Dressing one or multiple wounds []  - 0 Large Wound Dressing one or multiple wounds []  - 0 Application of Medications - topical []  - 0 Application of Medications - injection INTERVENTIONS - Miscellaneous []  - 0 External ear exam []  - 0 Specimen Collection (cultures, biopsies, blood, body fluids, etc.) []  - 0 Specimen(s) / Culture(s) sent or taken to Lab for analysis [name], [name] ([id number]) [id number] Page 3 of 9 []  - 0 Patient Transfer (multiple staff / Nurse, adult / Similar devices) []  - 0 Simple Staple / Suture removal (25 or less) []  - 0 Complex Staple / Suture removal (26 or more) []  - 0 Hypo / Hyperglycemic Management (close monitor of Blood Glucose) []  - 0 Ankle / Brachial Index (ABI) - do not check if billed separately X- 1 5 Vital Signs Has the patient been seen at the hospital within the last three years: Yes Total Score: 90 Level Of Care: New/Established - Level 3 Electronic Signature(s) Signed: 12/25/2022 5:31:20 PM By: [name] Aver MSN RN CNS WTA Entered By: [name] on 12/23/2022 09:52:05 -------------------------------------------------------------------------------- Encounter Discharge Information Details Patient Name: Date of Service: [name] 12/23/2022 12:15 PM Medical Record Number: [id number] Patient Account Number: [id number] Date of Birth/Sex: Treating RN: Oct 25, 1940 (82 y.o. [name] Primary Care [name]: [name] Other Clinician: Referring [name]: Treating [name]/Extender: [name] Weeks in Treatment: 9 Encounter Discharge Information Items Discharge Condition: Stable Ambulatory  Status: Walker Discharge Destination: Home Transportation: Private Auto Accompanied By: daughter in law Schedule Follow-up Appointment: Yes Clinical Summary of Care: Electronic Signature(s) Signed: 12/25/2022 5:31:20 PM By: [name] Aver MSN RN CNS WTA Entered By: [name] on 12/23/2022 09:53:24 -------------------------------------------------------------------------------- Lower Extremity Assessment Details Patient Name: Date of Service: [name], [name] 12/23/2022 12:15 PM Medical Record Number: [id number] Patient Account Number: [id number] Date of Birth/Sex: Treating RN: 11/29/1940 (82 y.o. [name] Primary Care Celester Lech: [name] Other Clinician: Pricilla Holm, Mallori [name] ([id number]) [id number] Page 4 of 9 Referring Jaslyne Beeck: Treating Davonte Siebenaler/Extender: [name] Weeks in Treatment: 9 Electronic Signature(s) Signed: 12/25/2022 5:31:20 PM By: [name] Aver MSN RN CNS WTA Entered By: [name] on 12/23/2022 09:31:07 -------------------------------------------------------------------------------- Multi Wound Chart Details Patient Name: Date of Service: [name] 12/23/2022 12:15 PM Medical Record Number: [id number] Patient Account Number: [id number] Date of Birth/Sex: Treating RN: [birth year] (82 y.o. [name] Primary Care Chundra Sauerwein: [name] Other Clinician: Referring Geran Haithcock: Treating Ja Pistole/Extender: [name] Weeks in Treatment: 9 Vital Signs Height(in): Pulse(bpm): 61 Weight(lbs): Blood Pressure(mmHg): 141/55 Body Mass Index(BMI): Temperature(F): 98.2 Respiratory Rate(breaths/min): 18 [1:Photos:] [N/A:N/A] Midline Coccyx N/A N/A Wound Location: Pressure Injury N/A N/A Wounding Event: Pressure Ulcer N/A N/A Primary Etiology: Hypertension, Received N/A N/A Comorbid History: Chemotherapy 08/30/2022 N/A N/A Date Acquired: 9 N/A N/A Weeks of Treatment: Open N/A N/A Wound Status: No N/A  N/A Wound Recurrence: 3.5x1.7x0.8 N/A N/A Measurements L x W x D (cm) 4.673 N/A N/A A (cm) : rea 3.738 N/A N/A Volume (cm) : 89.20% N/A N/A % Reduction in A rea: 96.60% N/A N/A % 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 12/23/2022 12:15 PM Medical Record Number: [id number] Patient Account Number: [id number] Date of Birth/Sex: Treating RN: [birth year] (82 y.o. [name] Primary Care [name]: [name] Other Clinician: Referring [name]: Treating [name]/Extender: [name] Weeks in Treatment: 9 Visit Information History Since Last Visit Added or deleted any medications: No Patient Arrived: [name] Any new allergies or adverse reactions: No Arrival Time: 12:21 Has Dressing in Place as Prescribed: Yes Accompanied By: daughter in law Pain Present Now: No Transfer Assistance: None Patient Identification Verified: Yes Secondary Verification Process Completed: Yes Patient Requires Transmission-Based Precautions: No Patient Has Alerts: Yes Patient Alerts: Not Diabetic Electronic Signature(s) Signed: 12/25/2022 5:31:20 PM By: [name] Aver MSN RN CNS WTA Entered By: [name] on 12/23/2022 09:22:01 -------------------------------------------------------------------------------- Clinic Level of Care Assessment Details Patient Name: Date of Service: [name], [name] 12/23/2022 12:15 PM Medical Record Number: [id number] Patient Account Number: [id number] Date of Birth/Sex: Treating RN: 04/20/1940 (82 y.o. [name] Primary Care Marvene Strohm: [name] Other Clinician: Referring Avyana Puffenbarger: Treating [name]/Extender: [name] Weeks in Treatment: 9 Clinic Level of Care Assessment Items TOOL 4 Quantity Score X- 1 0 Use when only an EandM is performed on FOLLOW-UP visit ASSESSMENTS - Nursing Assessment / Reassessment X- 1 10 Reassessment of Co-morbidities (includes updates in patient status) X- 1 5 Reassessment of Adherence to Treatment Plan ASSESSMENTS - Wound and Skin A ssessment /  Reassessment X - Simple Wound Assessment / Reassessment - one wound 1 5 [name], [name] ([id number]) [id number] Page 2 of 9 []  - 0 Complex Wound Assessment / Reassessment - multiple wounds []  - 0 Dermatologic / Skin Assessment (not related to wound area) ASSESSMENTS - Focused Assessment []  - 0 Circumferential Edema Measurements - multi extremities []  - 0 Nutritional Assessment / Counseling / Intervention []  - 0 Lower Extremity Assessment (monofilament, tuning fork, pulses) []  - 0 Peripheral Arterial Disease Assessment (using hand held doppler) ASSESSMENTS - Ostomy and/or Continence Assessment and Care []  - 0 Incontinence Assessment and Management []  - 0 Ostomy Care Assessment and Management (repouching, etc.) PROCESS - Coordination of Care X - Simple Patient / Family Education for ongoing care 1 15 []  - 0 Complex (extensive) Patient / Family Education for ongoing care X- 1 10 Staff obtains Chiropractor, Records, T Results / Process Orders est []  - 0 Staff telephones HHA, Nursing Homes / Clarify orders / etc []  - 0 Routine Transfer to another Facility (non-emergent condition) []  - 0 Routine Hospital Admission (non-emergent condition) []  - 0 New Admissions / Manufacturing engineer / Ordering NPWT Apligraf, etc. , []  - 0 Emergency Hospital Admission (emergent condition) X- 1 10 Simple Discharge Coordination []  - 0 Complex (extensive) Discharge Coordination PROCESS - Special Needs []  - 0 Pediatric / Minor Patient Management []  - 0 Isolation Patient Management []  - 0 Hearing / Language / Visual special needs []  - 0 Assessment of Community assistance (transportation, D/C planning, etc.) []  - 0 Additional assistance / Altered mentation []  - 0 Support Surface(s) Assessment (bed, cushion, seat, etc.) INTERVENTIONS - Wound Cleansing / Measurement X - Simple Wound Cleansing - one wound 1 5 []  - 0 Complex Wound Cleansing - multiple wounds X- 1  5 Wound Imaging (photographs - any number of wounds) []  - 0 Wound Tracing (instead of photographs) X- 1 5 Simple Wound Measurement - one wound []  -

## 2022-12-27 DIAGNOSIS — I824Z2 Acute embolism and thrombosis of unspecified deep veins of left distal lower extremity: Secondary | ICD-10-CM | POA: Diagnosis not present

## 2022-12-27 DIAGNOSIS — E43 Unspecified severe protein-calorie malnutrition: Secondary | ICD-10-CM | POA: Diagnosis not present

## 2022-12-27 DIAGNOSIS — L89154 Pressure ulcer of sacral region, stage 4: Secondary | ICD-10-CM | POA: Diagnosis not present

## 2022-12-27 DIAGNOSIS — C7951 Secondary malignant neoplasm of bone: Secondary | ICD-10-CM | POA: Diagnosis not present

## 2022-12-27 DIAGNOSIS — D630 Anemia in neoplastic disease: Secondary | ICD-10-CM | POA: Diagnosis not present

## 2022-12-27 DIAGNOSIS — C9000 Multiple myeloma not having achieved remission: Secondary | ICD-10-CM | POA: Diagnosis not present

## 2022-12-27 DIAGNOSIS — I272 Pulmonary hypertension, unspecified: Secondary | ICD-10-CM | POA: Diagnosis not present

## 2022-12-27 DIAGNOSIS — I129 Hypertensive chronic kidney disease with stage 1 through stage 4 chronic kidney disease, or unspecified chronic kidney disease: Secondary | ICD-10-CM | POA: Diagnosis not present

## 2022-12-27 DIAGNOSIS — G893 Neoplasm related pain (acute) (chronic): Secondary | ICD-10-CM | POA: Diagnosis not present

## 2022-12-28 DIAGNOSIS — M8448XD Pathological fracture, other site, subsequent encounter for fracture with routine healing: Secondary | ICD-10-CM | POA: Diagnosis not present

## 2022-12-28 DIAGNOSIS — L89159 Pressure ulcer of sacral region, unspecified stage: Secondary | ICD-10-CM | POA: Diagnosis not present

## 2022-12-28 DIAGNOSIS — G894 Chronic pain syndrome: Secondary | ICD-10-CM | POA: Diagnosis not present

## 2022-12-28 NOTE — Progress Notes (Unsigned)
[facility] OFFICE PROGRESS NOTE  [name], MD 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 Dorris Fetch on January 28, 2022.   CURRENT THERAPY: 1) Starting systemic therapy with Revlimid 21 days on 7 days off, Darzalex, and 20 mg of Decadron weekly.  First dose expected next week on 09/11/2022.  Status post 4 cycles.  2) Monthly Zometa injections for 6 to 12 months, followed by every 3 months.  INTERVAL HISTORY: [name] 82 y.o. female returns to the clinic today for a follow-up visit.  She was last seen by Dr. Arbutus Ped on 12/18/22. She is currently undergoing treatment with Revlimid, Darzalex, and Decadron.  She is tolerating this well except in the interval since last being seen, they called endorsing diarrhea. She was checked for c. Diff at her PCPs office which was ***. She had recently taken antibiotics for ***. Her diarrhea has *** at this time.   She reports stable  fatigue and generalized weakness.  Denies any fever, chills, night sweats, or unexplained weight loss.  Her appetite continues to be so-so.   She has been seen by palliative care and a member of the nutritionist team.    ***received dental clearance for zometa.   She drinks protein supplemental drinks once*** per day. Denies any chest pain, shortness of breath, recent cough, or hemoptysis.  Denies any nausea, vomiting, diarrhea, or constipation.  Denies any signs or symptoms of infection including sore throat, nasal congestion, cough, or hemoptysis except she is being treated with antibiotics on her pressure ulcer. She has wound care coming by the house 3x per  week. Se also goes to wound care once a week at the wound center at [facility].  Denies any abnormal bleeding or bruising.  She is here today for repeat blood work before undergoing day 1 cycle 5.       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 by echo 10/2021   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/2015   Carotid artery stenosis    < 50% stenosis bilaterally by doppler 07/2016   Coronary artery disease 2008   Coronary Ca score of 331 with minimal multivessel plaque < 25% stenosis by coronary CTA 8/23   Heart murmur    per pt   Hypercholesteremia    Hypertension    Obesity    Osteopenia    declines treatment   Pneumonia 1995   Pulmonary HTN (HCC)    mild to moderate by echo 7/23 with PASP   Shoulder pain    Due to arthritis   Vitamin D insufficiency     ALLERGIES:  is allergic to crestor [rosuvastatin calcium], lactose, lipitor [atorvastatin], pravastatin, simvastatin, tramadol, zetia [ezetimibe], codeine, penicillins, sulfa antibiotics, and vancomycin.  MEDICATIONS:  Current Outpatient Medications  Medication Sig Dispense Refill   acetaminophen (TYLENOL) 325 MG tablet  Take 2 tablets (650 mg total) by mouth every 8 (eight) hours as needed.     acyclovir (ZOVIRAX) 200 MG capsule Take 1 capsule (200 mg total) by mouth 2 (two) times daily. 30 capsule 2   allopurinol (ZYLOPRIM) 100 MG tablet Take 1 tablet (100 mg total) by mouth 2 (two) times daily. 60 tablet 2   ascorbic acid (VITAMIN C) 500 MG tablet Take 1 tablet (500 mg total) by mouth daily.     daratumumab-hyaluronidase-fihj (DARZALEX FASPRO) 1800-30000 MG-UT/15ML SOLN Inject 1,800 mg into the skin once.     dexamethasone (DECADRON) 4 MG tablet Please take 5 tablets (20  mg) weekly on the days of treatment 40 tablet 3   gentamicin ointment (GARAMYCIN) 0.1 % Apply 1 Application topically daily.     HYDROcodone-acetaminophen (NORCO/VICODIN) 5-325 MG tablet Take 1 tablet by mouth every 6 (six) hours as needed for moderate pain.     lenalidomide (REVLIMID) 15 MG capsule Take 1 capsule (15 mg total) by mouth daily for 21 days. Siri Cole # [id number]     Date Obtained 12/25/22 Adult female not of childbearing potential. 21 capsule 0   magic mouthwash SOLN Take 5 mLs by mouth 4 (four) times daily. Swish and spit 240 mL 0   Multiple Vitamin (MULTIVITAMIN WITH MINERALS) TABS tablet Take 1 tablet by mouth daily.     naloxone (NARCAN) nasal spray 4 mg/0.1 mL      nystatin cream (MYCOSTATIN) Apply 1 Application topically 2 (two) times daily.     omeprazole (PRILOSEC) 20 MG capsule Take 1 capsule (20 mg total) by mouth daily. 30 capsule 3   oxycodone (OXY-IR) 5 MG capsule Take 5 mg by mouth every 6 (six) hours.     sodium hypochlorite (DAKIN'S 1/4 STRENGTH) 0.125 % SOLN 1 application Externally Once a day for 30 days     No current facility-administered medications for this visit.    SURGICAL HISTORY:  Past Surgical History:  Procedure Laterality Date   AORTIC VALVE REPLACEMENT N/A 10/14/2013   Procedure: AORTIC VALVE REPLACEMENT (AVR);  Surgeon: Alleen Borne, MD;  Location: [facility] OR;  Service: Open Heart Surgery;  Laterality: N/A;   CARDIAC CATHETERIZATION     CATARACT EXTRACTION, BILATERAL     CHOLECYSTECTOMY  04/08/1988   ESOPHAGOGASTRODUODENOSCOPY (EGD) WITH PROPOFOL N/A 09/05/2022   Procedure: ESOPHAGOGASTRODUODENOSCOPY (EGD) WITH PROPOFOL;  Surgeon: Kathi Der, MD;  Location: [facility] ENDOSCOPY;  Service: Gastroenterology;  Laterality: N/A;   INTRAOPERATIVE TRANSESOPHAGEAL ECHOCARDIOGRAM N/A 10/14/2013   Procedure: INTRAOPERATIVE TRANSESOPHAGEAL ECHOCARDIOGRAM;  Surgeon: Alleen Borne, MD;  Location: [facility] OR;  Service: Open Heart Surgery;  Laterality: N/A;   IVC  FILTER INSERTION N/A 09/06/2022   Procedure: IVC FILTER INSERTION;  Surgeon: Leonie Douglas, MD;  Location: [facility] INVASIVE CV LAB;  Service: Cardiovascular;  Laterality: N/A;   KYPHOPLASTY Bilateral 03/27/2022   Procedure: KYPHOPLASTY AND BIOPSY THORACIC ELEVEN;  Surgeon: Lisbeth Renshaw, MD;  Location: [facility] OR;  Service: Neurosurgery;  Laterality: Bilateral;   LEFT AND RIGHT HEART CATHETERIZATION WITH CORONARY ANGIOGRAM N/A 09/16/2013   Procedure: LEFT AND RIGHT HEART CATHETERIZATION WITH CORONARY ANGIOGRAM;  Surgeon: Quintella Reichert, MD;  Location: [facility] CATH LAB;  Service: Cardiovascular;  Laterality: N/A;   TONSILLECTOMY      REVIEW OF SYSTEMS:   Review of Systems  Constitutional: Negative for appetite change, chills, fatigue, fever and unexpected weight change.  HENT:   Negative for mouth sores, nosebleeds, sore throat and trouble swallowing.   Eyes: Negative for eye  problems and icterus.  Respiratory: Negative for cough, hemoptysis, shortness of breath and wheezing.   Cardiovascular: Negative for chest pain and leg swelling.  Gastrointestinal: Negative for abdominal pain, constipation, diarrhea, nausea and vomiting.  Genitourinary: Negative for bladder incontinence, difficulty urinating, dysuria, frequency and hematuria.   Musculoskeletal: Negative for back pain, gait problem, neck pain and neck stiffness.  Skin: Negative for itching and rash.  Neurological: Negative for dizziness, extremity weakness, gait problem, headaches, light-headedness and seizures.  Hematological: Negative for adenopathy. Does not bruise/bleed easily.  Psychiatric/Behavioral: Negative for confusion, depression and sleep disturbance. The patient is not nervous/anxious.     PHYSICAL EXAMINATION:  There were no vitals taken for this visit.  ECOG PERFORMANCE STATUS: {CHL ONC ECOG [id number]  Physical Exam  Constitutional: Oriented to person, place, and time and well-developed, well-nourished, and in no  distress. No distress.  HENT:  Head: Normocephalic and atraumatic.  Mouth/Throat: Oropharynx is clear and moist. No oropharyngeal exudate.  Eyes: Conjunctivae are normal. Right eye exhibits no discharge. Left eye exhibits no discharge. No scleral icterus.  Neck: Normal range of motion. Neck supple.  Cardiovascular: Normal rate, regular rhythm, normal heart sounds and intact distal pulses.   Pulmonary/Chest: Effort normal and breath sounds normal. No respiratory distress. No wheezes. No rales.  Abdominal: Soft. Bowel sounds are normal. Exhibits no distension and no mass. There is no tenderness.  Musculoskeletal: Normal range of motion. Exhibits no edema.  Lymphadenopathy:    No cervical adenopathy.  Neurological: Alert and oriented to person, place, and time. Exhibits normal muscle tone. Gait normal. Coordination normal.  Skin: Skin is warm and dry. No rash noted. Not diaphoretic. No erythema. No pallor.  Psychiatric: Mood, memory and judgment normal.  Vitals reviewed.  LABORATORY DATA: Lab Results  Component Value Date   WBC 9.5 12/18/2022   HGB 10.2 (L) 12/18/2022   HCT 30.1 (L) 12/18/2022   MCV 102.4 (H) 12/18/2022   PLT 194 12/18/2022      Chemistry      Component Value Date/Time   NA 141 12/18/2022 0959   NA 141 10/06/2020 0939   K 3.5 12/18/2022 0959   CL 107 12/18/2022 0959   CO2 26 12/18/2022 0959   BUN 19 12/18/2022 0959   BUN 17 10/06/2020 0939   CREATININE 0.61 12/18/2022 0959   CREATININE 0.85 01/29/2016 0910      Component Value Date/Time   CALCIUM 8.6 (L) 12/18/2022 0959   ALKPHOS 161 (H) 12/18/2022 0959   AST 18 12/18/2022 0959   ALT 26 12/18/2022 0959   BILITOT 0.4 12/18/2022 0959       RADIOGRAPHIC STUDIES:  No results found.   ASSESSMENT/PLAN:  This is a very pleasant 82 year old Caucasian female with  1) history of stage I (T1a, N0, M0) thymoma type AB diagnosed in October 2023 status post resection under the care of Dr. Dorris Fetch on  January 28, 2022 with close resection margin and microscopic infiltration of the capsule. 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     She is currently undergoing treatment with systemic therapy with daratumumab, Revlimid 15 mg p.o. daily for 21 days every 4 weeks in addition to Decadron 20 mg weekly with the treatment.  She is status post 4 cycles and tolerating this well.  Labs were reviewed. Recommend ***  We will see her back for a follow up visit in 2*** ask mm if we really need to see her every 2 weeks*** weeks for  evaluation and repeat blood work before starting cycle ***  The patient was advised to call immediately if she has any concerning symptoms in the interval. The patient voices understanding of current disease status and treatment options and is in agreement with the current care plan. All questions were answered. The patient knows to call the clinic with any problems, questions or concerns. We can certainly see the patient much sooner if necessary .   No orders of the defined types were placed in this encounter.    I spent {CHL ONC TIME VISIT - ZOXWR:[id number]} counseling the patient face to face. The total time spent in the appointment was {CHL ONC TIME VISIT - BJYNW:[id number]}.  Romello Hoehn L Jeannetta Cerutti, PA-C 12/28/22

## 2022-12-30 ENCOUNTER — Encounter: Payer: Medicare PPO | Admitting: Physician Assistant

## 2022-12-30 DIAGNOSIS — L89154 Pressure ulcer of sacral region, stage 4: Secondary | ICD-10-CM | POA: Diagnosis not present

## 2022-12-30 DIAGNOSIS — L89153 Pressure ulcer of sacral region, stage 3: Secondary | ICD-10-CM | POA: Diagnosis not present

## 2022-12-30 DIAGNOSIS — M6281 Muscle weakness (generalized): Secondary | ICD-10-CM | POA: Diagnosis not present

## 2022-12-30 DIAGNOSIS — C9000 Multiple myeloma not having achieved remission: Secondary | ICD-10-CM | POA: Diagnosis not present

## 2022-12-30 NOTE — Progress Notes (Unsigned)
Cardiology Office Note:    Date:  12/31/2022  ID:  [name], DOB May 14, 1940, MRN [id number] PCP: [name], MD  [facility] Providers Cardiologist:  Armanda Magic, MD       Patient Profile:      Aortic stenosis s/p bioprosthetic AVR 10/2013 TTE 10/10/21: EF 60-65, no RWMA, mild LVH, Gr 1 DD, NL RVSF, mild pulm HTN, RVSP 42.4, severe LAE, mild RAE, mild MR, s/p AVR w mean 17 mmHg, mild dilation of ascending aorta (40 mm), RAP 3 Ascending aorta dilation Coronary artery disease  CCTA 11/16/21: LAD, LCx, RCA 0-24; CAC 331 (67th percentile),  Carotid US 09/05/20: minimal RICA plaque, no sig ICA stenosis Supraventricular Tachycardia  Monitor 09/2021: NSVT 13 beats, 3 episodes of SVT (longest 10 beats) 2.6% PVCs Hypertension  Hyperlipidemia  Pulmonary hypertension Hx of DVT 08/2022  Worsening anemia/UGI bleed on anticoagulation >> s/p IVC filter  Aortic atherosclerosis  Multiple myeloma         History of Present Illness:  Discussed the use of AI scribe software for clinical note transcription with the patient, who gave verbal consent to proceed.   [name] is a 82 y.o. female who returns for follow up of AS, CAD. She was last seen by Dr. Mayford Knife in 09/2021. She is here with her son. She has a sacral decubitus which is painful. She has been going to the wound clinic. The patient denies any chest pain but does experience shortness of breath when moving around a lot. This is chronic w/o change. The patient denies any episodes of syncope, orthopnea, significant edema.     ROS:  See HPI    Studies Reviewed:        Risk Assessment/Calculations:           Physical Exam:   VS:  BP (!) 140/52   Pulse 68   Ht 5' 1.5" (1.562 m)   Wt 145 lb 12.8 oz (66.1 kg)   SpO2 96%   BMI 27.10 kg/m    Wt Readings from Last 3 Encounters:  12/31/22 145 lb 12.8 oz (66.1 kg)  12/18/22 146 lb (66.2 kg)  12/04/22 139 lb 9.6 oz (63.3 kg)    Constitutional:      Appearance: Healthy  appearance. Not in distress.  Neck:     Vascular: No JVR. JVD normal.  Pulmonary:     Breath sounds: Normal breath sounds. No wheezing. No rales.  Cardiovascular:     Normal rate. Regular rhythm.     Murmurs: There is a grade 2/6 systolic murmur at the URSB.  Edema:    Peripheral edema absent.  Abdominal:     Palpations: Abdomen is soft.  Skin:    General: Skin is warm and dry.        Assessment and Plan:     Aortic Stenosis Status post bioprosthetic AVR in 2015. AVR stable on last echocardiogram in July 2023. Ascending aorta dilation measuring 40mm on echo in July 2023. -Arrange follow-up echocardiogram.  Coronary Artery Disease Minimal non-obstructive CAD on coronary CTA in August 2023. No chest symptoms to suggest angina. Intolerant to statins, ezetimibe, and has declined PCSK9 inhibitor. With hx of gastritis and UGI bleeding, I do not recommend ASA.  Hypertension Borderline control, medications were stopped in the past due to hypotension. She was previously on Lisinopril, hydrochlorothiazide.  -Continue to monitor blood pressure and resume meds if BP above target.  Pulmonary Hypertension Mild increase in PASP on echocardiogram in July  2023. -Plan follow-up echocardiogram.  History of DVT Underwent IVC filter placement in May 2024. Anticoagulation was stopped due to upper GI bleeding and worsening anemia.  Multiple Myeloma Continue follow-up with oncology.  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  Hyperlipidemia Patient is intolerant to statins and ezetimibe, and has declined PCSK9 inhibitor and hypotherapy. We could consider Leqvio in the future.            Dispo:  Return in about 1 year (around 12/31/2023) for Routine Follow Up w/ Dr. Mayford Knife.  Signed, Tereso Newcomer, PA-C

## 2022-12-30 NOTE — Progress Notes (Signed)
currently in regard to her wound. This is actually showing  signs of improvement wound VAC was put on much more eloquently this time compared to last I am actually very pleased. He does not like there may be some irritation around the edges of the wound I think this may be subsequent to an issue going on with infection. We can look into trying to determine what this may be and then adjusting medications as needed to take care of that infection if so. 11-25-2022 upon evaluation today patient appears to be doing well currently in regard to her wound which is actually showing signs of improvement feeling little by little. The irritation around the edges of the wound is better and the wound VAC seems to have been doing well until he ran out of the DuoDERM and apparently last time this was put on the DuoDERM was not utilized. Again I think that is something that has been extremely beneficial for keeping this wound VAC in place without any complications in the past. 8/26; patient comes in with home health changing the wound VAC 3 times a week Monday Wednesday and Friday. Per our intake nurse the tunneling area superiorly has close down nicely. Progress has been good. She did not come in with a wound VAC on, they apparently took it off so she could be a but home health is coming in later today to change it. There is some concerns about next Monday being a holiday with regards to wound VAC change by home health 12-16-2022 upon evaluation today patient appears to be doing well currently in regard to her wound. She has been tolerating the dressing changes without complication. Fortunately there does not appear to be any signs of active infection locally or systemically which is great news. No fevers, chills, nausea, vomiting, or diarrhea. 12-23-2022 upon evaluation today patient appears to be doing well currently in regard to her sacral wound. This is actually showing signs of significant improvement. Fortunately I do not see any evidence of worsening this has filled in  quite nicely and really there is no significant depth to the wound is pretty much flush. I do believe that she may benefit from discontinuation of the VAC on the right least. On hold this week and see where things stand she is in agreement with that plan. 12-30-2022 upon evaluation today patient appears to be doing well currently in regard to her wound which is actually showing signs of excellent improvement. Fortunately I do not see any evidence of worsening overall and I do believe that the patient is making great headway towards complete closure. Electronic Signature(s) Signed: 12/30/2022 1:04:04 PM By: Allen Derry PA-C Entered By: Allen Derry on 12/30/2022 84:04:04 -------------------------------------------------------------------------------- Physical Exam Details Patient Name: Date of Service: [name], [name] NN B. 12/30/2022 12:15 PM Medical Record Number: [id number] Patient Account Number: [id number] Date of Birth/Sex: Treating RN: 1940-11-09 (82 y.o. [name] Primary Care Provider: Hillard Danker Other Clinician: Referring Provider: Treating Provider/Extender: Madelynn Done Weeks in Treatment: 10 Constitutional Well-nourished and well-hydrated in no acute distress. Respiratory normal breathing without difficulty. Psychiatric this patient is able to make decisions and demonstrates good insight into disease process. Alert and Oriented x 3. pleasant and cooperative. Notes Upon inspection patient's wound bed actually showed signs of good granulation and epithelization at this point. Fortunately the patient is making excellent headway towards complete closure which is great news. Electronic Signature(s) Signed: 12/30/2022 1:05:11 PM By: Allen Derry PA-C Previous Signature: 12/30/2022 1:04:19 PM  Zetuvit Plus SILICONE BORDER Dressing 5x5 (in/in) (Home Health) 3 x Per Week/30 Days Discharge Instructions: Please do not put silicone bordered dressings under  wraps. Use non-bordered dressing only. 1. Based on what I am seeing I do believe that the patient is making excellent headway towards closure I feel like the Hydrofera Blue is doing a great job with the bordered foam dressing to cover. 2. I am good recommend as well that the patient should continue with appropriate offloading I think this is still of utmost importance. 3. I would also suggest that she should continue to monitor for any signs of infection labs if anything changes she knows to contact the office and let me know. We will see patient back for reevaluation in 1 week here in the clinic. If anything worsens or changes patient will contact our office for additional recommendations. Electronic Signature(s) Signed: 12/30/2022 1:05:46 PM By: Allen Derry PA-C Entered By: Allen Derry on 12/30/2022 13:05:46 -------------------------------------------------------------------------------- SuperBill Details Patient Name: Date of Service: Rich Number NN B. 12/30/2022 Medical Record Number: [id number] Patient Account Number: [id number] Date of Birth/Sex: Treating RN: 07-Dec-1940 (82 y.o. 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 This is actually showing  signs of improvement wound VAC was put on much more eloquently this time compared to last I am actually very pleased. He does not like there may be some irritation around the edges of the wound I think this may be subsequent to an issue going on with infection. We can look into trying to determine what this may be and then adjusting medications as needed to take care of that infection if so. 11-25-2022 upon evaluation today patient appears to be doing well currently in regard to her wound which is actually showing signs of improvement feeling little by little. The irritation around the edges of the wound is better and the wound VAC seems to have been doing well until he ran out of the DuoDERM and apparently last time this was put on the DuoDERM was not utilized. Again I think that is something that has been extremely beneficial for keeping this wound VAC in place without any complications in the past. 8/26; patient comes in with home health changing the wound VAC 3 times a week Monday Wednesday and Friday. Per our intake nurse the tunneling area superiorly has close down nicely. Progress has been good. She did not come in with a wound VAC on, they apparently took it off so she could be a but home health is coming in later today to change it. There is some concerns about next Monday being a holiday with regards to wound VAC change by home health 12-16-2022 upon evaluation today patient appears to be doing well currently in regard to her wound. She has been tolerating the dressing changes without complication. Fortunately there does not appear to be any signs of active infection locally or systemically which is great news. No fevers, chills, nausea, vomiting, or diarrhea. 12-23-2022 upon evaluation today patient appears to be doing well currently in regard to her sacral wound. This is actually showing signs of significant improvement. Fortunately I do not see any evidence of worsening this has filled in  quite nicely and really there is no significant depth to the wound is pretty much flush. I do believe that she may benefit from discontinuation of the VAC on the right least. On hold this week and see where things stand she is in agreement with that plan. 12-30-2022 upon evaluation today patient appears to be doing well currently in regard to her wound which is actually showing signs of excellent improvement. Fortunately I do not see any evidence of worsening overall and I do believe that the patient is making great headway towards complete closure. Electronic Signature(s) Signed: 12/30/2022 1:04:04 PM By: Allen Derry PA-C Entered By: Allen Derry on 12/30/2022 84:04:04 -------------------------------------------------------------------------------- Physical Exam Details Patient Name: Date of Service: [name], [name] NN B. 12/30/2022 12:15 PM Medical Record Number: [id number] Patient Account Number: [id number] Date of Birth/Sex: Treating RN: 1940-11-09 (82 y.o. [name] Primary Care Provider: Hillard Danker Other Clinician: Referring Provider: Treating Provider/Extender: Madelynn Done Weeks in Treatment: 10 Constitutional Well-nourished and well-hydrated in no acute distress. Respiratory normal breathing without difficulty. Psychiatric this patient is able to make decisions and demonstrates good insight into disease process. Alert and Oriented x 3. pleasant and cooperative. Notes Upon inspection patient's wound bed actually showed signs of good granulation and epithelization at this point. Fortunately the patient is making excellent headway towards complete closure which is great news. Electronic Signature(s) Signed: 12/30/2022 1:05:11 PM By: Allen Derry PA-C Previous Signature: 12/30/2022 1:04:19 PM  currently in regard to her wound. This is actually showing  signs of improvement wound VAC was put on much more eloquently this time compared to last I am actually very pleased. He does not like there may be some irritation around the edges of the wound I think this may be subsequent to an issue going on with infection. We can look into trying to determine what this may be and then adjusting medications as needed to take care of that infection if so. 11-25-2022 upon evaluation today patient appears to be doing well currently in regard to her wound which is actually showing signs of improvement feeling little by little. The irritation around the edges of the wound is better and the wound VAC seems to have been doing well until he ran out of the DuoDERM and apparently last time this was put on the DuoDERM was not utilized. Again I think that is something that has been extremely beneficial for keeping this wound VAC in place without any complications in the past. 8/26; patient comes in with home health changing the wound VAC 3 times a week Monday Wednesday and Friday. Per our intake nurse the tunneling area superiorly has close down nicely. Progress has been good. She did not come in with a wound VAC on, they apparently took it off so she could be a but home health is coming in later today to change it. There is some concerns about next Monday being a holiday with regards to wound VAC change by home health 12-16-2022 upon evaluation today patient appears to be doing well currently in regard to her wound. She has been tolerating the dressing changes without complication. Fortunately there does not appear to be any signs of active infection locally or systemically which is great news. No fevers, chills, nausea, vomiting, or diarrhea. 12-23-2022 upon evaluation today patient appears to be doing well currently in regard to her sacral wound. This is actually showing signs of significant improvement. Fortunately I do not see any evidence of worsening this has filled in  quite nicely and really there is no significant depth to the wound is pretty much flush. I do believe that she may benefit from discontinuation of the VAC on the right least. On hold this week and see where things stand she is in agreement with that plan. 12-30-2022 upon evaluation today patient appears to be doing well currently in regard to her wound which is actually showing signs of excellent improvement. Fortunately I do not see any evidence of worsening overall and I do believe that the patient is making great headway towards complete closure. Electronic Signature(s) Signed: 12/30/2022 1:04:04 PM By: Allen Derry PA-C Entered By: Allen Derry on 12/30/2022 84:04:04 -------------------------------------------------------------------------------- Physical Exam Details Patient Name: Date of Service: [name], [name] NN B. 12/30/2022 12:15 PM Medical Record Number: [id number] Patient Account Number: [id number] Date of Birth/Sex: Treating RN: 1940-11-09 (82 y.o. [name] Primary Care Provider: Hillard Danker Other Clinician: Referring Provider: Treating Provider/Extender: Madelynn Done Weeks in Treatment: 10 Constitutional Well-nourished and well-hydrated in no acute distress. Respiratory normal breathing without difficulty. Psychiatric this patient is able to make decisions and demonstrates good insight into disease process. Alert and Oriented x 3. pleasant and cooperative. Notes Upon inspection patient's wound bed actually showed signs of good granulation and epithelization at this point. Fortunately the patient is making excellent headway towards complete closure which is great news. Electronic Signature(s) Signed: 12/30/2022 1:05:11 PM By: Allen Derry PA-C Previous Signature: 12/30/2022 1:04:19 PM  Version By: Allen Derry PA-C Entered By: Allen Derry on 12/30/2022 13:05:11 Gul, [name] B ([id number]) [id number] Page 3 of  7 -------------------------------------------------------------------------------- Physician Orders Details Patient Name: Date of Service: [name], [name] [place] B. 12/30/2022 12:15 PM Medical Record Number: [id number] Patient Account Number: [id number] Date of Birth/Sex: Treating RN: 1941-04-08 (82 y.o. 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 Home Health DISCONTINUE Home Health for Wound Care. - FAMILY WILL DO DRESSING CHANGES. Bathing/ Shower/ Hygiene May shower; gently cleanse wound with antibacterial soap, rinse and pat dry prior to dressing wounds No tub bath. Off-Loading Low air-loss mattress (Group 2) Turn and reposition every 2 hours Wound Treatment Wound #1 - Coccyx Wound Laterality: Midline Cleanser: Normal Saline (Home Health) 3 x Per Week/30 Days Discharge Instructions: Wash your hands with soap and water. Remove old dressing, discard into plastic bag and place into trash. Cleanse the wound with Normal Saline prior to applying a clean dressing using gauze sponges, not tissues or cotton balls. Do not scrub or use excessive force. Pat dry using gauze sponges, not tissue or cotton balls. Prim Dressing: Hydrofera Blue Ready Transfer Foam, 2.5x2.5 (in/in) (Home Health) 3 x Per Week/30 Days ary Discharge Instructions: Apply Hydrofera Blue Ready to wound bed as directed Prim Dressing: Cavilon No Sting Barrier Film (Home Health) 3 x Per Week/30 Days ary Secondary Dressing: (BORDER) Zetuvit Plus SILICONE BORDER Dressing 5x5 (in/in) (Home Health) 3 x Per Week/30 Days Discharge Instructions: Please do  not put silicone bordered dressings under wraps. Use non-bordered dressing only. Electronic Signature(s) Unsigned Entered By: Midge Aver on 12/30/2022 13:01:52 Signature(s): Pricilla Holm, Lyndall B ([id number]) [phone] Date(s): [id number] Page 4 of 7 -------------------------------------------------------------------------------- Problem List Details Patient Name: Date of Service: [name], [name] [place] B. 12/30/2022 12:15 PM Medical Record Number: [id number] Patient Account Number: [id number] Date of Birth/Sex: Treating RN: Jan 18, 1941 (82 y.o. 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 12/30/2022 12:15 PM Medical Record Number: [id number] Patient Account Number: [id number] Date of Birth/Sex: Treating RN: [birth year] (82 y.o. [name] Primary Care Provider: Hillard Danker Other Clinician: Referring Provider: Treating Provider/Extender: Madelynn Done Weeks in Treatment: 10 Subjective Chief Complaint Information obtained from Patient [place], [place] B ([id number]) [id number] Page 5 of 7 Stage 4 pressure ulcer History of Present Illness (HPI) 10-21-2022 upon evaluation today patient appears to be doing pretty well in general in regard to a fairly  significant however pressure ulcer in the midline sacral/coccyx region which is definitely stage IV. Currently there is not direct bone exposure because she is now covered with granulation tissue her family has been taking care of her at home at this point although she was previously in a facility. This is something that was  currently in regard to her wound. This is actually showing  signs of improvement wound VAC was put on much more eloquently this time compared to last I am actually very pleased. He does not like there may be some irritation around the edges of the wound I think this may be subsequent to an issue going on with infection. We can look into trying to determine what this may be and then adjusting medications as needed to take care of that infection if so. 11-25-2022 upon evaluation today patient appears to be doing well currently in regard to her wound which is actually showing signs of improvement feeling little by little. The irritation around the edges of the wound is better and the wound VAC seems to have been doing well until he ran out of the DuoDERM and apparently last time this was put on the DuoDERM was not utilized. Again I think that is something that has been extremely beneficial for keeping this wound VAC in place without any complications in the past. 8/26; patient comes in with home health changing the wound VAC 3 times a week Monday Wednesday and Friday. Per our intake nurse the tunneling area superiorly has close down nicely. Progress has been good. She did not come in with a wound VAC on, they apparently took it off so she could be a but home health is coming in later today to change it. There is some concerns about next Monday being a holiday with regards to wound VAC change by home health 12-16-2022 upon evaluation today patient appears to be doing well currently in regard to her wound. She has been tolerating the dressing changes without complication. Fortunately there does not appear to be any signs of active infection locally or systemically which is great news. No fevers, chills, nausea, vomiting, or diarrhea. 12-23-2022 upon evaluation today patient appears to be doing well currently in regard to her sacral wound. This is actually showing signs of significant improvement. Fortunately I do not see any evidence of worsening this has filled in  quite nicely and really there is no significant depth to the wound is pretty much flush. I do believe that she may benefit from discontinuation of the VAC on the right least. On hold this week and see where things stand she is in agreement with that plan. 12-30-2022 upon evaluation today patient appears to be doing well currently in regard to her wound which is actually showing signs of excellent improvement. Fortunately I do not see any evidence of worsening overall and I do believe that the patient is making great headway towards complete closure. Electronic Signature(s) Signed: 12/30/2022 1:04:04 PM By: Allen Derry PA-C Entered By: Allen Derry on 12/30/2022 84:04:04 -------------------------------------------------------------------------------- Physical Exam Details Patient Name: Date of Service: [name], [name] NN B. 12/30/2022 12:15 PM Medical Record Number: [id number] Patient Account Number: [id number] Date of Birth/Sex: Treating RN: 1940-11-09 (82 y.o. [name] Primary Care Provider: Hillard Danker Other Clinician: Referring Provider: Treating Provider/Extender: Madelynn Done Weeks in Treatment: 10 Constitutional Well-nourished and well-hydrated in no acute distress. Respiratory normal breathing without difficulty. Psychiatric this patient is able to make decisions and demonstrates good insight into disease process. Alert and Oriented x 3. pleasant and cooperative. Notes Upon inspection patient's wound bed actually showed signs of good granulation and epithelization at this point. Fortunately the patient is making excellent headway towards complete closure which is great news. Electronic Signature(s) Signed: 12/30/2022 1:05:11 PM By: Allen Derry PA-C Previous Signature: 12/30/2022 1:04:19 PM

## 2022-12-31 ENCOUNTER — Ambulatory Visit: Payer: Medicare PPO | Attending: Nurse Practitioner | Admitting: Physician Assistant

## 2022-12-31 ENCOUNTER — Encounter: Payer: Self-pay | Admitting: Physician Assistant

## 2022-12-31 VITALS — BP 140/52 | HR 68 | Ht 61.5 in | Wt 145.8 lb

## 2022-12-31 DIAGNOSIS — Q231 Congenital insufficiency of aortic valve: Secondary | ICD-10-CM

## 2022-12-31 DIAGNOSIS — Q230 Congenital stenosis of aortic valve: Secondary | ICD-10-CM

## 2022-12-31 NOTE — Progress Notes (Signed)
Treating Physician/Extender: [name] in Treatment: 10 Education Assessment Education Provided To: Patient Education Topics Provided Wound/Skin Impairment: Handouts: Caring for Your Ulcer Methods: Explain/Verbal Responses: State content correctly Electronic Signature(s) Signed: 12/31/2022 4:21:27 PM By: [name] Aver MSN RN CNS WTA Entered By: [name] on 12/30/2022 13:02:55 -------------------------------------------------------------------------------- Wound Assessment Details Patient Name: Date of Service: [name], [name] [name]  12/30/2022 12:15 PM Medical Record Number: [id number] Patient Account Number: [id number] Date of Birth/Sex: Treating RN: [birth year] [name] (82 y.o. [name] Primary Care [name]: [name] Other Clinician: Referring [name]: Treating [name]/Extender: [name] in Treatment: 10 Wound Status Wound Number: 1 Primary Etiology: Pressure Ulcer Wound Location: Midline Coccyx Wound Status: Open Wounding Event: Pressure Injury Comorbid History: Hypertension, Received Chemotherapy Date Acquired: 08/30/2022 [name] Of Treatment: 10 Clustered Wound: No Sattler, Lael [name] ([id number]) [id number] Page 8 of 9 Photos Wound Measurements Length: (cm) 3 Width: (cm) 1.2 Depth: (cm) 0.3 Area: (cm) 2.827 Volume: (cm) 0.848 % Reduction in Area: 93.5% % Reduction in Volume: 99.2% Wound Description Classification: Category/Stage IV Exudate Amount: Medium Exudate Type: Serosanguineous Exudate Color: red, brown Foul Odor After Cleansing: No Slough/Fibrino Yes Wound Bed Granulation Amount: Medium (34-66%) Exposed Structure Granulation Quality: Red, Pink Fascia Exposed: No Necrotic Amount: Medium (34-66%) Fat Layer (Subcutaneous Tissue) Exposed: Yes Tendon Exposed: No Muscle Exposed: No Joint Exposed: No Bone Exposed: Yes Treatment Notes Wound #1 (Coccyx) Wound Laterality: Midline Cleanser Normal Saline Discharge Instruction: Wash your hands with soap and water. Remove old dressing, discard into plastic bag and place into trash. Cleanse the wound with Normal Saline prior to applying a clean dressing using gauze sponges, not tissues or cotton balls. Do not scrub or use excessive force. Pat dry using gauze sponges, not tissue or cotton balls. Peri-Wound Care Topical Primary Dressing Hydrofera Blue Ready Transfer Foam, 2.5x2.5 (in/in) Discharge Instruction: Apply Hydrofera Blue Ready to wound bed as directed Cavilon No Sting Barrier Film Secondary  Dressing (BORDER) Zetuvit Plus SILICONE BORDER Dressing 5x5 (in/in) Discharge Instruction: Please do not put silicone bordered dressings under wraps. Use non-bordered dressing only. Secured With Compression Wrap Compression Stockings Facilities manager) Signed: 12/31/2022 4:21:27 PM By: [name] Aver MSN RN CNS WTA Entered By: [name] on 12/30/2022 12:50:02 Lantier, Leone [name] ([id number]) [id number] Page 9 of 9 -------------------------------------------------------------------------------- Vitals Details Patient Name: Date of Service: [name], [name] 12/30/2022 12:15 PM Medical Record Number: [id number] Patient Account Number: [id number] Date of Birth/Sex: Treating RN: [name] (82 y.o. [name] Primary Care [name]: [name] Other Clinician: Referring [name]: Treating [name]/Extender: [name] in Treatment: 10 Vital Signs Time Taken: 12:44 Temperature (F): 97.9 Pulse (bpm): 82 Respiratory Rate (breaths/min): 18 Blood Pressure (mmHg): 163/59 Reference Range: 80 - 120 mg / dl Electronic Signature(s) Signed: 12/31/2022 4:21:27 PM By: [name] Aver MSN RN CNS WTA Entered By: [name] on 12/30/2022 12:45:12  [name], [name] ([id number]) [id number] Page 1 of 9 Visit Report for 12/30/2022 Arrival Information Details Patient Name: Date of Service: [name], [name] 12/30/2022 12:15 PM Medical Record Number: [id number] Patient Account Number: [id number] Date of Birth/Sex: Treating RN: [name] (82 y.o. [name] Primary Care [name]: [name] Other Clinician: Referring [name]: Treating [name]/Extender: [name] in Treatment: 10 Visit Information History Since Last Visit Added or deleted any medications: No Patient Arrived: [name] Any new allergies or adverse reactions: No Arrival Time: 12:38 Has Dressing in Place as Prescribed: Yes Accompanied By: daughter in law Pain Present Now: No Transfer Assistance: None Patient Requires Transmission-Based Precautions: No Patient Has Alerts: Yes Patient Alerts: Not Diabetic Electronic Signature(s) Signed: 12/31/2022 4:21:27 PM By: [name] Aver MSN RN CNS WTA Entered By: [name] on 12/30/2022 13:03:57 -------------------------------------------------------------------------------- Clinic Level of Care Assessment Details Patient Name: Date of Service: [name], [name] [name] 12/30/2022 12:15 PM Medical Record Number: [id number] Patient Account Number: [id number] Date of Birth/Sex: Treating RN: [birth year] [name] (82 y.o. [name] Primary Care Luma Clopper: [name] Other Clinician: Referring Meigan Pates: Treating Edelmira Gallogly/Extender: [name] in Treatment: 10 Clinic Level of Care Assessment Items TOOL 4 Quantity Score X- 1 0 Use when only an EandM is performed on FOLLOW-UP visit ASSESSMENTS - Nursing Assessment / Reassessment X- 1 10 Reassessment of Co-morbidities (includes updates in patient status) X- 1 5 Reassessment of Adherence to Treatment Plan ASSESSMENTS - Wound and Skin A ssessment / Reassessment X - Simple Wound Assessment / Reassessment - one wound 1 5 []  -  0 Complex Wound Assessment / Reassessment - multiple wounds [name], [name] ([id number]) [id number] Page 2 of 9 []  - 0 Dermatologic / Skin Assessment (not related to wound area) ASSESSMENTS - Focused Assessment []  - 0 Circumferential Edema Measurements - multi extremities []  - 0 Nutritional Assessment / Counseling / Intervention []  - 0 Lower Extremity Assessment (monofilament, tuning fork, pulses) []  - 0 Peripheral Arterial Disease Assessment (using hand held doppler) ASSESSMENTS - Ostomy and/or Continence Assessment and Care []  - 0 Incontinence Assessment and Management []  - 0 Ostomy Care Assessment and Management (repouching, etc.) PROCESS - Coordination of Care X - Simple Patient / Family Education for ongoing care 1 15 []  - 0 Complex (extensive) Patient / Family Education for ongoing care X- 1 10 Staff obtains Consents, Records, T Results / Process Orders est []  - 0 Staff telephones HHA, Nursing Homes / Clarify orders / etc []  - 0 Routine Transfer to another Facility (non-emergent condition) []  - 0 Routine Hospital Admission (non-emergent condition) []  - 0 New Admissions / Manufacturing engineer / Ordering NPWT Apligraf, etc. , []  - 0 Emergency Hospital Admission (emergent condition) X- 1 10 Simple Discharge Coordination []  - 0 Complex (extensive) Discharge Coordination PROCESS - Special Needs []  - 0 Pediatric / Minor Patient Management []  - 0 Isolation Patient Management []  - 0 Hearing / Language / Visual special needs []  - 0 Assessment of Community assistance (transportation, D/C planning, etc.) []  - 0 Additional assistance / Altered mentation []  - 0 Support Surface(s) Assessment (bed, cushion, seat, etc.) INTERVENTIONS - Wound Cleansing / Measurement X - Simple Wound Cleansing - one wound 1 5 []  - 0 Complex Wound Cleansing - multiple wounds X- 1 5 Wound Imaging (photographs - any number of wounds) []  - 0 Wound Tracing  (instead of photographs) X- 1 5 Simple Wound Measurement - one wound []  - 0 Complex Wound Measurement - multiple wounds INTERVENTIONS -  [name], [name] ([id number]) [id number] Page 1 of 9 Visit Report for 12/30/2022 Arrival Information Details Patient Name: Date of Service: [name], [name] 12/30/2022 12:15 PM Medical Record Number: [id number] Patient Account Number: [id number] Date of Birth/Sex: Treating RN: [name] (82 y.o. [name] Primary Care [name]: [name] Other Clinician: Referring [name]: Treating [name]/Extender: [name] in Treatment: 10 Visit Information History Since Last Visit Added or deleted any medications: No Patient Arrived: [name] Any new allergies or adverse reactions: No Arrival Time: 12:38 Has Dressing in Place as Prescribed: Yes Accompanied By: daughter in law Pain Present Now: No Transfer Assistance: None Patient Requires Transmission-Based Precautions: No Patient Has Alerts: Yes Patient Alerts: Not Diabetic Electronic Signature(s) Signed: 12/31/2022 4:21:27 PM By: [name] Aver MSN RN CNS WTA Entered By: [name] on 12/30/2022 13:03:57 -------------------------------------------------------------------------------- Clinic Level of Care Assessment Details Patient Name: Date of Service: [name], [name] [name] 12/30/2022 12:15 PM Medical Record Number: [id number] Patient Account Number: [id number] Date of Birth/Sex: Treating RN: [birth year] [name] (82 y.o. [name] Primary Care Luma Clopper: [name] Other Clinician: Referring Meigan Pates: Treating Edelmira Gallogly/Extender: [name] in Treatment: 10 Clinic Level of Care Assessment Items TOOL 4 Quantity Score X- 1 0 Use when only an EandM is performed on FOLLOW-UP visit ASSESSMENTS - Nursing Assessment / Reassessment X- 1 10 Reassessment of Co-morbidities (includes updates in patient status) X- 1 5 Reassessment of Adherence to Treatment Plan ASSESSMENTS - Wound and Skin A ssessment / Reassessment X - Simple Wound Assessment / Reassessment - one wound 1 5 []  -  0 Complex Wound Assessment / Reassessment - multiple wounds [name], [name] ([id number]) [id number] Page 2 of 9 []  - 0 Dermatologic / Skin Assessment (not related to wound area) ASSESSMENTS - Focused Assessment []  - 0 Circumferential Edema Measurements - multi extremities []  - 0 Nutritional Assessment / Counseling / Intervention []  - 0 Lower Extremity Assessment (monofilament, tuning fork, pulses) []  - 0 Peripheral Arterial Disease Assessment (using hand held doppler) ASSESSMENTS - Ostomy and/or Continence Assessment and Care []  - 0 Incontinence Assessment and Management []  - 0 Ostomy Care Assessment and Management (repouching, etc.) PROCESS - Coordination of Care X - Simple Patient / Family Education for ongoing care 1 15 []  - 0 Complex (extensive) Patient / Family Education for ongoing care X- 1 10 Staff obtains Consents, Records, T Results / Process Orders est []  - 0 Staff telephones HHA, Nursing Homes / Clarify orders / etc []  - 0 Routine Transfer to another Facility (non-emergent condition) []  - 0 Routine Hospital Admission (non-emergent condition) []  - 0 New Admissions / Manufacturing engineer / Ordering NPWT Apligraf, etc. , []  - 0 Emergency Hospital Admission (emergent condition) X- 1 10 Simple Discharge Coordination []  - 0 Complex (extensive) Discharge Coordination PROCESS - Special Needs []  - 0 Pediatric / Minor Patient Management []  - 0 Isolation Patient Management []  - 0 Hearing / Language / Visual special needs []  - 0 Assessment of Community assistance (transportation, D/C planning, etc.) []  - 0 Additional assistance / Altered mentation []  - 0 Support Surface(s) Assessment (bed, cushion, seat, etc.) INTERVENTIONS - Wound Cleansing / Measurement X - Simple Wound Cleansing - one wound 1 5 []  - 0 Complex Wound Cleansing - multiple wounds X- 1 5 Wound Imaging (photographs - any number of wounds) []  - 0 Wound Tracing  (instead of photographs) X- 1 5 Simple Wound Measurement - one wound []  - 0 Complex Wound Measurement - multiple wounds INTERVENTIONS -  [name], [name] ([id number]) [id number] Page 1 of 9 Visit Report for 12/30/2022 Arrival Information Details Patient Name: Date of Service: [name], [name] 12/30/2022 12:15 PM Medical Record Number: [id number] Patient Account Number: [id number] Date of Birth/Sex: Treating RN: [name] (82 y.o. [name] Primary Care [name]: [name] Other Clinician: Referring [name]: Treating [name]/Extender: [name] in Treatment: 10 Visit Information History Since Last Visit Added or deleted any medications: No Patient Arrived: [name] Any new allergies or adverse reactions: No Arrival Time: 12:38 Has Dressing in Place as Prescribed: Yes Accompanied By: daughter in law Pain Present Now: No Transfer Assistance: None Patient Requires Transmission-Based Precautions: No Patient Has Alerts: Yes Patient Alerts: Not Diabetic Electronic Signature(s) Signed: 12/31/2022 4:21:27 PM By: [name] Aver MSN RN CNS WTA Entered By: [name] on 12/30/2022 13:03:57 -------------------------------------------------------------------------------- Clinic Level of Care Assessment Details Patient Name: Date of Service: [name], [name] [name] 12/30/2022 12:15 PM Medical Record Number: [id number] Patient Account Number: [id number] Date of Birth/Sex: Treating RN: [birth year] [name] (82 y.o. [name] Primary Care Luma Clopper: [name] Other Clinician: Referring Meigan Pates: Treating Edelmira Gallogly/Extender: [name] in Treatment: 10 Clinic Level of Care Assessment Items TOOL 4 Quantity Score X- 1 0 Use when only an EandM is performed on FOLLOW-UP visit ASSESSMENTS - Nursing Assessment / Reassessment X- 1 10 Reassessment of Co-morbidities (includes updates in patient status) X- 1 5 Reassessment of Adherence to Treatment Plan ASSESSMENTS - Wound and Skin A ssessment / Reassessment X - Simple Wound Assessment / Reassessment - one wound 1 5 []  -  0 Complex Wound Assessment / Reassessment - multiple wounds [name], [name] ([id number]) [id number] Page 2 of 9 []  - 0 Dermatologic / Skin Assessment (not related to wound area) ASSESSMENTS - Focused Assessment []  - 0 Circumferential Edema Measurements - multi extremities []  - 0 Nutritional Assessment / Counseling / Intervention []  - 0 Lower Extremity Assessment (monofilament, tuning fork, pulses) []  - 0 Peripheral Arterial Disease Assessment (using hand held doppler) ASSESSMENTS - Ostomy and/or Continence Assessment and Care []  - 0 Incontinence Assessment and Management []  - 0 Ostomy Care Assessment and Management (repouching, etc.) PROCESS - Coordination of Care X - Simple Patient / Family Education for ongoing care 1 15 []  - 0 Complex (extensive) Patient / Family Education for ongoing care X- 1 10 Staff obtains Consents, Records, T Results / Process Orders est []  - 0 Staff telephones HHA, Nursing Homes / Clarify orders / etc []  - 0 Routine Transfer to another Facility (non-emergent condition) []  - 0 Routine Hospital Admission (non-emergent condition) []  - 0 New Admissions / Manufacturing engineer / Ordering NPWT Apligraf, etc. , []  - 0 Emergency Hospital Admission (emergent condition) X- 1 10 Simple Discharge Coordination []  - 0 Complex (extensive) Discharge Coordination PROCESS - Special Needs []  - 0 Pediatric / Minor Patient Management []  - 0 Isolation Patient Management []  - 0 Hearing / Language / Visual special needs []  - 0 Assessment of Community assistance (transportation, D/C planning, etc.) []  - 0 Additional assistance / Altered mentation []  - 0 Support Surface(s) Assessment (bed, cushion, seat, etc.) INTERVENTIONS - Wound Cleansing / Measurement X - Simple Wound Cleansing - one wound 1 5 []  - 0 Complex Wound Cleansing - multiple wounds X- 1 5 Wound Imaging (photographs - any number of wounds) []  - 0 Wound Tracing  (instead of photographs) X- 1 5 Simple Wound Measurement - one wound []  - 0 Complex Wound Measurement - multiple wounds INTERVENTIONS -

## 2022-12-31 NOTE — Patient Instructions (Addendum)
Medication Instructions:  Your physician recommends that you continue on your current medications as directed. Please refer to the Current Medication list given to you today.  *If you need a refill on your cardiac medications before your next appointment, please call your pharmacy*   Lab Work: None ordered  If you have labs (blood work) drawn today and your tests are completely normal, you will receive your results only by: MyChart Message (if you have MyChart) OR A paper copy in the mail If you have any lab test that is abnormal or we need to change your treatment, we will call you to review the results.   Testing/Procedures: Your physician has requested that you have an echocardiogram. Echocardiography is a painless test that uses sound waves to create images of your heart. It provides your doctor with information about the size and shape of your heart and how well your heart's chambers and valves are working. This procedure takes approximately one hour. There are no restrictions for this procedure. Please do NOT wear cologne, perfume, aftershave, or lotions (deodorant is allowed). Please arrive 15 minutes prior to your appointment time.     Follow-Up: At [facility], you and your health needs are our priority.  As part of our continuing mission to provide you with exceptional heart care, we have created designated Provider Care Teams.  These Care Teams include your primary Cardiologist (physician) and Advanced Practice Providers (APPs -  Physician Assistants and Nurse Practitioners) who all work together to provide you with the care you need, when you need it.  We recommend signing up for the patient portal called "MyChart".  Sign up information is provided on this After Visit Summary.  MyChart is used to connect with patients for Virtual Visits (Telemedicine).  Patients are able to view lab/test results, encounter notes, upcoming appointments, etc.  Non-urgent messages can be  sent to your provider as well.   To learn more about what you can do with MyChart, go to [email or web address].    Your next appointment:   1 year(s)  Provider:   Armanda Magic, MD  or Tereso Newcomer, PA-C         Other Instructions

## 2023-01-01 ENCOUNTER — Inpatient Hospital Stay: Payer: Medicare PPO

## 2023-01-01 ENCOUNTER — Encounter: Payer: Self-pay | Admitting: *Deleted

## 2023-01-01 ENCOUNTER — Inpatient Hospital Stay (HOSPITAL_BASED_OUTPATIENT_CLINIC_OR_DEPARTMENT_OTHER): Payer: Medicare PPO | Admitting: Physician Assistant

## 2023-01-01 ENCOUNTER — Inpatient Hospital Stay: Payer: Medicare PPO | Admitting: Dietician

## 2023-01-01 VITALS — BP 127/51 | HR 56 | Temp 98.5°F | Resp 16 | Wt 145.6 lb

## 2023-01-01 DIAGNOSIS — Z7961 Long term (current) use of immunomodulator: Secondary | ICD-10-CM | POA: Diagnosis not present

## 2023-01-01 DIAGNOSIS — I272 Pulmonary hypertension, unspecified: Secondary | ICD-10-CM | POA: Diagnosis not present

## 2023-01-01 DIAGNOSIS — C7951 Secondary malignant neoplasm of bone: Secondary | ICD-10-CM | POA: Diagnosis not present

## 2023-01-01 DIAGNOSIS — Z5112 Encounter for antineoplastic immunotherapy: Secondary | ICD-10-CM | POA: Diagnosis not present

## 2023-01-01 DIAGNOSIS — I824Z2 Acute embolism and thrombosis of unspecified deep veins of left distal lower extremity: Secondary | ICD-10-CM | POA: Diagnosis not present

## 2023-01-01 DIAGNOSIS — D630 Anemia in neoplastic disease: Secondary | ICD-10-CM | POA: Diagnosis not present

## 2023-01-01 DIAGNOSIS — A09 Infectious gastroenteritis and colitis, unspecified: Secondary | ICD-10-CM | POA: Diagnosis not present

## 2023-01-01 DIAGNOSIS — G893 Neoplasm related pain (acute) (chronic): Secondary | ICD-10-CM | POA: Diagnosis not present

## 2023-01-01 DIAGNOSIS — K2210 Ulcer of esophagus without bleeding: Secondary | ICD-10-CM | POA: Diagnosis not present

## 2023-01-01 DIAGNOSIS — C9000 Multiple myeloma not having achieved remission: Secondary | ICD-10-CM

## 2023-01-01 DIAGNOSIS — Z79624 Long term (current) use of inhibitors of nucleotide synthesis: Secondary | ICD-10-CM | POA: Diagnosis not present

## 2023-01-01 DIAGNOSIS — E43 Unspecified severe protein-calorie malnutrition: Secondary | ICD-10-CM | POA: Diagnosis not present

## 2023-01-01 DIAGNOSIS — I129 Hypertensive chronic kidney disease with stage 1 through stage 4 chronic kidney disease, or unspecified chronic kidney disease: Secondary | ICD-10-CM | POA: Diagnosis not present

## 2023-01-01 DIAGNOSIS — Z79899 Other long term (current) drug therapy: Secondary | ICD-10-CM | POA: Diagnosis not present

## 2023-01-01 DIAGNOSIS — L89154 Pressure ulcer of sacral region, stage 4: Secondary | ICD-10-CM | POA: Diagnosis not present

## 2023-01-01 DIAGNOSIS — D649 Anemia, unspecified: Secondary | ICD-10-CM | POA: Diagnosis not present

## 2023-01-01 DIAGNOSIS — E46 Unspecified protein-calorie malnutrition: Secondary | ICD-10-CM | POA: Diagnosis not present

## 2023-01-01 LAB — CMP (CANCER CENTER ONLY)
ALT: 20 U/L (ref 0–44)
AST: 15 U/L (ref 15–41)
Albumin: 3.3 g/dL — ABNORMAL LOW (ref 3.5–5.0)
Alkaline Phosphatase: 147 U/L — ABNORMAL HIGH (ref 38–126)
Anion gap: 5 (ref 5–15)
BUN: 17 mg/dL (ref 8–23)
CO2: 28 mmol/L (ref 22–32)
Calcium: 8.6 mg/dL — ABNORMAL LOW (ref 8.9–10.3)
Chloride: 108 mmol/L (ref 98–111)
Creatinine: 0.72 mg/dL (ref 0.44–1.00)
GFR, Estimated: >60 mL/min (ref >60)
Glucose, Bld: 135 mg/dL — ABNORMAL HIGH (ref 70–99)
Potassium: 3.8 mmol/L (ref 3.5–5.1)
Sodium: 141 mmol/L (ref 135–145)
Total Bilirubin: 0.3 mg/dL (ref 0.3–1.2)
Total Protein: 5.1 g/dL — ABNORMAL LOW (ref 6.5–8.1)

## 2023-01-01 LAB — CBC WITH DIFFERENTIAL (CANCER CENTER ONLY)
Abs Immature Granulocytes: 0.07 10*3/uL (ref 0.00–0.07)
Basophils Absolute: 0.1 10*3/uL (ref 0.0–0.1)
Basophils Relative: 1 %
Eosinophils Absolute: 0.1 10*3/uL (ref 0.0–0.5)
Eosinophils Relative: 1 %
HCT: 30.6 % — ABNORMAL LOW (ref 36.0–46.0)
Hemoglobin: 10.1 g/dL — ABNORMAL LOW (ref 12.0–15.0)
Immature Granulocytes: 1 %
Lymphocytes Relative: 23 %
Lymphs Abs: 2.2 10*3/uL (ref 0.7–4.0)
MCH: 34.2 pg — ABNORMAL HIGH (ref 26.0–34.0)
MCHC: 33 g/dL (ref 30.0–36.0)
MCV: 103.7 fL — ABNORMAL HIGH (ref 80.0–100.0)
Monocytes Absolute: 1.1 10*3/uL — ABNORMAL HIGH (ref 0.1–1.0)
Monocytes Relative: 12 %
Neutro Abs: 6 10*3/uL (ref 1.7–7.7)
Neutrophils Relative %: 62 %
Platelet Count: 182 10*3/uL (ref 150–400)
RBC: 2.95 MIL/uL — ABNORMAL LOW (ref 3.87–5.11)
RDW: 16.3 % — ABNORMAL HIGH (ref 11.5–15.5)
WBC Count: 9.7 10*3/uL (ref 4.0–10.5)
nRBC: 0 % (ref 0.0–0.2)

## 2023-01-01 LAB — LACTATE DEHYDROGENASE: LDH: 170 U/L (ref 98–192)

## 2023-01-01 LAB — SAMPLE TO BLOOD BANK

## 2023-01-02 DIAGNOSIS — I10 Essential (primary) hypertension: Secondary | ICD-10-CM | POA: Diagnosis not present

## 2023-01-02 DIAGNOSIS — G894 Chronic pain syndrome: Secondary | ICD-10-CM | POA: Diagnosis not present

## 2023-01-02 DIAGNOSIS — M8448XD Pathological fracture, other site, subsequent encounter for fracture with routine healing: Secondary | ICD-10-CM | POA: Diagnosis not present

## 2023-01-02 DIAGNOSIS — L89159 Pressure ulcer of sacral region, unspecified stage: Secondary | ICD-10-CM | POA: Diagnosis not present

## 2023-01-03 ENCOUNTER — Telehealth: Payer: Self-pay | Admitting: Internal Medicine

## 2023-01-03 NOTE — Telephone Encounter (Signed)
Scheduled per 09/25 work-queue and los, called and spoke with patient's son. Patient will be notified of upcoming appointments.

## 2023-01-06 ENCOUNTER — Encounter: Payer: Medicare PPO | Admitting: Physician Assistant

## 2023-01-06 DIAGNOSIS — M6281 Muscle weakness (generalized): Secondary | ICD-10-CM | POA: Diagnosis not present

## 2023-01-06 DIAGNOSIS — L89154 Pressure ulcer of sacral region, stage 4: Secondary | ICD-10-CM | POA: Diagnosis not present

## 2023-01-06 DIAGNOSIS — L89153 Pressure ulcer of sacral region, stage 3: Secondary | ICD-10-CM | POA: Diagnosis not present

## 2023-01-06 DIAGNOSIS — C9000 Multiple myeloma not having achieved remission: Secondary | ICD-10-CM | POA: Diagnosis not present

## 2023-01-06 NOTE — Progress Notes (Addendum)
WOLTERS, Fredericka B ([id number]) [id number] Page 1 of 7 Visit Report for 01/06/2023 Chief Complaint Document Details Patient Name: Date of Service: [name], [name] 01/06/2023 12:15 PM Medical Record Number: [id number] Patient Account Number: [id number] Date of Birth/Sex: Treating RN: [birth year] (82 y.o. [name] Primary Care Provider: Hillard Danker Other Clinician: Referring Provider: Treating Provider/Extender: Madelynn Done Weeks in Treatment: 11 Information Obtained from: Patient Chief Complaint Stage 4 pressure ulcer Electronic Signature(s) Signed: 01/06/2023 12:36:31 PM By: Allen Derry PA-C Entered By: Allen Derry on 01/06/2023 12:36:31 -------------------------------------------------------------------------------- HPI Details Patient Name: Date of Service: [name], [name] NN B. 01/06/2023 12:15 PM Medical Record Number: [id number] Patient Account Number: [id number] Date of Birth/Sex: Treating RN: 1940-10-23 (82 y.o. 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 Currently there is not direct bone exposure because she is now covered with granulation tissue her family has been taking care of her at home at this point although she was previously in a facility. This is something that was noted when she ended up having to go to the ER initially on Aug 30, 2022. Subsequently during hospital stent it was started that she had gentamicin applied topically along with Dakin's moistened gauze packing which has been done at home now and seems to be doing very well. She was at [facility] skilled nursing  facility for time. She did have Levaquin previous which she did do very well for her as far as get the infection under control in my opinion I think she is actually seeming to do quite well at this point. She is not having any pain which is also excellent news at this time. She is starting to get around more so she is not completely bedbound although she is a little weak she does have family that is very attentive and taking great care of her. 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 There is an area of bone that is very thinly covered. Been using Dakin's wet-to-dry dressings. She has in-home caregivers 24/7 and also home health coming out to change dressings/participate in dressing supervision although most of her dressing changes are being done by her own in-home caregivers. 11-04-2022 upon evaluation today patient appears to be doing well currently in regard to her wound. With that being said I do feel like she is making really good progress towards healing currently. I do not see any evidence of active infection locally nor systemically which is excellent news as well. No fevers, chills, Geister, Remi B ([id number]) [phone].pdf Page 2 of 7 nausea, vomiting, or diarrhea. 11-12-2022 patient's wound today appears to be doing okay although to be honest I am a little concerned about the fact that the wound VAC was not put on properly. It was not bridged she was laying on the tubing and this course was not very comfortable for her. Fortunately I do not see any signs of active infection locally or systemically which is great news. No fevers, chills, nausea, vomiting, or diarrhea. 11-18-2022 upon evaluation today patient appears to be doing well  sacral region, stage 4 10/21/2022 No Yes M62.81 Muscle weakness (generalized) 10/21/2022 No Yes C90.00 Multiple myeloma not having achieved remission 10/21/2022 No Yes Inactive Problems Schrieber, Austina B ([id number]) [id number] Page 5 of 7 Resolved Problems Electronic Signature(s) Signed: 01/06/2023 12:36:26 PM By: Allen Derry PA-C Entered By: Allen Derry on 01/06/2023 12:36:26 -------------------------------------------------------------------------------- Progress Note Details Patient Name: Date of Service: [name], [name] NN B. 01/06/2023 12:15 PM Medical Record Number: [id number] Patient Account Number: [id number] Date of Birth/Sex: Treating RN: 11/20/1940 (82 y.o. 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 Currently there is not direct bone exposure because she is now covered with granulation tissue her family has been taking care of her at home at this point although she was previously in a facility. This is something that was noted when she ended up having to go to the ER initially on Aug 30, 2022. Subsequently during hospital stent it was started that she had gentamicin applied topically along with Dakin's moistened gauze packing which has been done at home now and seems to be doing very well. She was at [facility] skilled nursing facility  for time. She did have Levaquin previous which she did do very well for her as far as get the infection under control in my opinion I think she is actually seeming to do quite well at this point. She is not having any pain which is also excellent news at this time. She is starting to get around more so she is not completely bedbound although she is a little weak she does have family that is very attentive and taking great care of her. 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 There is an area of bone that is very thinly covered. Been using Dakin's wet-to-dry dressings. She has in-home caregivers 24/7 and also home health coming out to change dressings/participate in dressing supervision although most of her dressing changes are being done by her own in-home caregivers. 11-04-2022 upon evaluation today patient appears to be doing well currently in regard to her wound. With that being said I do feel like she is making really good progress towards healing currently. I do not see any evidence of active infection locally nor systemically which is excellent news as well. No fevers, chills, nausea, vomiting, or diarrhea. 11-12-2022 patient's wound today appears to be doing okay although to be honest I am a little concerned about the fact that the wound VAC was not put on properly. It was not bridged she was laying on the tubing and this course was not very comfortable for her. Fortunately I do not see any signs of active infection locally or systemically which is great news. No fevers, chills, nausea, vomiting, or diarrhea. 11-18-2022 upon evaluation today patient appears to be doing well currently in regard to her wound. This is actually showing signs of improvement wound VAC was put on much more eloquently this time compared to last I  am actually very pleased. He does not like there may  well currently. Integumentary (Hair, Skin) Wound #1 status is Open. Original cause of wound was Pressure Injury. The date acquired was: 08/30/2022. The wound has been in treatment 11 weeks.  The wound is located on the Midline Coccyx. The wound measures 2.7cm length x 1.2cm width x 0.2cm depth; 2.545cm^2 area and 0.509cm^3 volume. There is bone and Fat Layer (Subcutaneous Tissue) exposed. There is a medium amount of serosanguineous drainage noted. There is medium (34-66%) red, pink granulation within the wound bed. There is a medium (34-66%) amount of necrotic tissue within the wound bed. Assessment Active Problems ICD-10 Pressure ulcer of sacral region, stage 4 Muscle weakness (generalized) Multiple myeloma not having achieved remission Procedures Wound #1 Pre-procedure diagnosis of Wound #1 is a Pressure Ulcer located on the Midline Coccyx . An CHEM CAUT GRANULATION TISS procedure was performed by Allen Derry, PA-C. Post procedure Diagnosis Wound #1: Same as Pre-Procedure Plan Follow-up Appointments: Return Appointment in 1 week. Home Health: DISCONTINUE Home Health for Wound Care. - FAMILY WILL DO DRESSING CHANGES. Bathing/ Shower/ Hygiene: May shower; gently cleanse wound with antibacterial soap, rinse and pat dry prior to dressing wounds No tub bath. Off-Loading: Low air-loss mattress (Group 2) Turn and reposition every 2 hours WOUND #1: - Coccyx Wound Laterality: Midline Cleanser: Normal Saline (Home Health) 3 x Per Week/30 Days Discharge Instructions: Wash your hands with soap and water. Remove old dressing, discard into plastic bag and place into trash. Cleanse the wound with Normal Saline prior to applying a clean dressing using gauze sponges, not tissues or cotton balls. Do not scrub or use excessive force. Pat dry using gauze sponges, not tissue or cotton balls. Prim Dressing: Hydrofera Blue Ready Transfer Foam, 2.5x2.5 (in/in) (Home Health) 3 x Per Week/30 Days ary Discharge Instructions: Apply Hydrofera Blue Ready to wound bed as directed Marhefka, Ellyson B ([id number]) [phone].pdf Page 7 of 7 Prim Dressing: Cavilon No Financial trader (Home Health) 3 x Per Week/30 Days ary Secondary Dressing: (BORDER) Zetuvit Plus SILICONE BORDER Dressing 5x5 (in/in) (Home Health) 3 x Per Week/30 Days Discharge Instructions: Please do not put silicone bordered dressings under wraps. Use non-bordered dressing only. 1. I would recommend currently that the patient should continue with the [facility] which I think is still doing a really good job. 2. I am also can recommend that we have the patient continue with the barrier film around the edges of the wound followed by the bordered foam dressing which seems to be doing well. 3. I am also can recommend continued and appropriate offloading which I think is still going to be really good for her at this point. We will see patient back for reevaluation in 1 week here in the clinic. If anything worsens or changes patient will contact our office for additional recommendations. Electronic Signature(s) Signed: 01/06/2023 12:56:39 PM By: Allen Derry PA-C Entered By: Allen Derry on 01/06/2023 12:56:39 -------------------------------------------------------------------------------- SuperBill Details Patient Name: Date of Service: Rich Number NN B. 01/06/2023 Medical Record Number: [id number] Patient Account Number: [id number] Date of Birth/Sex: Treating RN: 1941-04-08 (82 y.o. [name] Primary Care Provider: Hillard Danker Other Clinician: Referring Provider: Treating Provider/Extender: Madelynn Done Weeks in Treatment: 11 Diagnosis Coding ICD-10 Codes Code Description L89.154 Pressure ulcer of sacral region, stage 4 M62.81 Muscle weakness (generalized) C90.00 Multiple myeloma not having achieved remission Facility Procedures : 3 CPT4 Code: 6100160 Description: 17250 - CHEM CAUT GRANULATION TISS ICD-10 Diagnosis Description L89.154 Pressure ulcer of sacral region, stage 4 Modifier: Quantity:  well currently. Integumentary (Hair, Skin) Wound #1 status is Open. Original cause of wound was Pressure Injury. The date acquired was: 08/30/2022. The wound has been in treatment 11 weeks.  The wound is located on the Midline Coccyx. The wound measures 2.7cm length x 1.2cm width x 0.2cm depth; 2.545cm^2 area and 0.509cm^3 volume. There is bone and Fat Layer (Subcutaneous Tissue) exposed. There is a medium amount of serosanguineous drainage noted. There is medium (34-66%) red, pink granulation within the wound bed. There is a medium (34-66%) amount of necrotic tissue within the wound bed. Assessment Active Problems ICD-10 Pressure ulcer of sacral region, stage 4 Muscle weakness (generalized) Multiple myeloma not having achieved remission Procedures Wound #1 Pre-procedure diagnosis of Wound #1 is a Pressure Ulcer located on the Midline Coccyx . An CHEM CAUT GRANULATION TISS procedure was performed by Allen Derry, PA-C. Post procedure Diagnosis Wound #1: Same as Pre-Procedure Plan Follow-up Appointments: Return Appointment in 1 week. Home Health: DISCONTINUE Home Health for Wound Care. - FAMILY WILL DO DRESSING CHANGES. Bathing/ Shower/ Hygiene: May shower; gently cleanse wound with antibacterial soap, rinse and pat dry prior to dressing wounds No tub bath. Off-Loading: Low air-loss mattress (Group 2) Turn and reposition every 2 hours WOUND #1: - Coccyx Wound Laterality: Midline Cleanser: Normal Saline (Home Health) 3 x Per Week/30 Days Discharge Instructions: Wash your hands with soap and water. Remove old dressing, discard into plastic bag and place into trash. Cleanse the wound with Normal Saline prior to applying a clean dressing using gauze sponges, not tissues or cotton balls. Do not scrub or use excessive force. Pat dry using gauze sponges, not tissue or cotton balls. Prim Dressing: Hydrofera Blue Ready Transfer Foam, 2.5x2.5 (in/in) (Home Health) 3 x Per Week/30 Days ary Discharge Instructions: Apply Hydrofera Blue Ready to wound bed as directed Marhefka, Ellyson B ([id number]) [phone].pdf Page 7 of 7 Prim Dressing: Cavilon No Financial trader (Home Health) 3 x Per Week/30 Days ary Secondary Dressing: (BORDER) Zetuvit Plus SILICONE BORDER Dressing 5x5 (in/in) (Home Health) 3 x Per Week/30 Days Discharge Instructions: Please do not put silicone bordered dressings under wraps. Use non-bordered dressing only. 1. I would recommend currently that the patient should continue with the [facility] which I think is still doing a really good job. 2. I am also can recommend that we have the patient continue with the barrier film around the edges of the wound followed by the bordered foam dressing which seems to be doing well. 3. I am also can recommend continued and appropriate offloading which I think is still going to be really good for her at this point. We will see patient back for reevaluation in 1 week here in the clinic. If anything worsens or changes patient will contact our office for additional recommendations. Electronic Signature(s) Signed: 01/06/2023 12:56:39 PM By: Allen Derry PA-C Entered By: Allen Derry on 01/06/2023 12:56:39 -------------------------------------------------------------------------------- SuperBill Details Patient Name: Date of Service: Rich Number NN B. 01/06/2023 Medical Record Number: [id number] Patient Account Number: [id number] Date of Birth/Sex: Treating RN: 1941-04-08 (82 y.o. [name] Primary Care Provider: Hillard Danker Other Clinician: Referring Provider: Treating Provider/Extender: Madelynn Done Weeks in Treatment: 11 Diagnosis Coding ICD-10 Codes Code Description L89.154 Pressure ulcer of sacral region, stage 4 M62.81 Muscle weakness (generalized) C90.00 Multiple myeloma not having achieved remission Facility Procedures : 3 CPT4 Code: 6100160 Description: 17250 - CHEM CAUT GRANULATION TISS ICD-10 Diagnosis Description L89.154 Pressure ulcer of sacral region, stage 4 Modifier: Quantity:  WOLTERS, Fredericka B ([id number]) [id number] Page 1 of 7 Visit Report for 01/06/2023 Chief Complaint Document Details Patient Name: Date of Service: [name], [name] 01/06/2023 12:15 PM Medical Record Number: [id number] Patient Account Number: [id number] Date of Birth/Sex: Treating RN: [birth year] (82 y.o. [name] Primary Care Provider: Hillard Danker Other Clinician: Referring Provider: Treating Provider/Extender: Madelynn Done Weeks in Treatment: 11 Information Obtained from: Patient Chief Complaint Stage 4 pressure ulcer Electronic Signature(s) Signed: 01/06/2023 12:36:31 PM By: Allen Derry PA-C Entered By: Allen Derry on 01/06/2023 12:36:31 -------------------------------------------------------------------------------- HPI Details Patient Name: Date of Service: [name], [name] NN B. 01/06/2023 12:15 PM Medical Record Number: [id number] Patient Account Number: [id number] Date of Birth/Sex: Treating RN: 1940-10-23 (82 y.o. 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 Currently there is not direct bone exposure because she is now covered with granulation tissue her family has been taking care of her at home at this point although she was previously in a facility. This is something that was noted when she ended up having to go to the ER initially on Aug 30, 2022. Subsequently during hospital stent it was started that she had gentamicin applied topically along with Dakin's moistened gauze packing which has been done at home now and seems to be doing very well. She was at [facility] skilled nursing  facility for time. She did have Levaquin previous which she did do very well for her as far as get the infection under control in my opinion I think she is actually seeming to do quite well at this point. She is not having any pain which is also excellent news at this time. She is starting to get around more so she is not completely bedbound although she is a little weak she does have family that is very attentive and taking great care of her. 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 There is an area of bone that is very thinly covered. Been using Dakin's wet-to-dry dressings. She has in-home caregivers 24/7 and also home health coming out to change dressings/participate in dressing supervision although most of her dressing changes are being done by her own in-home caregivers. 11-04-2022 upon evaluation today patient appears to be doing well currently in regard to her wound. With that being said I do feel like she is making really good progress towards healing currently. I do not see any evidence of active infection locally nor systemically which is excellent news as well. No fevers, chills, Geister, Remi B ([id number]) [phone].pdf Page 2 of 7 nausea, vomiting, or diarrhea. 11-12-2022 patient's wound today appears to be doing okay although to be honest I am a little concerned about the fact that the wound VAC was not put on properly. It was not bridged she was laying on the tubing and this course was not very comfortable for her. Fortunately I do not see any signs of active infection locally or systemically which is great news. No fevers, chills, nausea, vomiting, or diarrhea. 11-18-2022 upon evaluation today patient appears to be doing well  sacral region, stage 4 10/21/2022 No Yes M62.81 Muscle weakness (generalized) 10/21/2022 No Yes C90.00 Multiple myeloma not having achieved remission 10/21/2022 No Yes Inactive Problems Schrieber, Austina B ([id number]) [id number] Page 5 of 7 Resolved Problems Electronic Signature(s) Signed: 01/06/2023 12:36:26 PM By: Allen Derry PA-C Entered By: Allen Derry on 01/06/2023 12:36:26 -------------------------------------------------------------------------------- Progress Note Details Patient Name: Date of Service: [name], [name] NN B. 01/06/2023 12:15 PM Medical Record Number: [id number] Patient Account Number: [id number] Date of Birth/Sex: Treating RN: 11/20/1940 (82 y.o. 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 Currently there is not direct bone exposure because she is now covered with granulation tissue her family has been taking care of her at home at this point although she was previously in a facility. This is something that was noted when she ended up having to go to the ER initially on Aug 30, 2022. Subsequently during hospital stent it was started that she had gentamicin applied topically along with Dakin's moistened gauze packing which has been done at home now and seems to be doing very well. She was at [facility] skilled nursing facility  for time. She did have Levaquin previous which she did do very well for her as far as get the infection under control in my opinion I think she is actually seeming to do quite well at this point. She is not having any pain which is also excellent news at this time. She is starting to get around more so she is not completely bedbound although she is a little weak she does have family that is very attentive and taking great care of her. 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 There is an area of bone that is very thinly covered. Been using Dakin's wet-to-dry dressings. She has in-home caregivers 24/7 and also home health coming out to change dressings/participate in dressing supervision although most of her dressing changes are being done by her own in-home caregivers. 11-04-2022 upon evaluation today patient appears to be doing well currently in regard to her wound. With that being said I do feel like she is making really good progress towards healing currently. I do not see any evidence of active infection locally nor systemically which is excellent news as well. No fevers, chills, nausea, vomiting, or diarrhea. 11-12-2022 patient's wound today appears to be doing okay although to be honest I am a little concerned about the fact that the wound VAC was not put on properly. It was not bridged she was laying on the tubing and this course was not very comfortable for her. Fortunately I do not see any signs of active infection locally or systemically which is great news. No fevers, chills, nausea, vomiting, or diarrhea. 11-18-2022 upon evaluation today patient appears to be doing well currently in regard to her wound. This is actually showing signs of improvement wound VAC was put on much more eloquently this time compared to last I  am actually very pleased. He does not like there may  well currently. Integumentary (Hair, Skin) Wound #1 status is Open. Original cause of wound was Pressure Injury. The date acquired was: 08/30/2022. The wound has been in treatment 11 weeks.  The wound is located on the Midline Coccyx. The wound measures 2.7cm length x 1.2cm width x 0.2cm depth; 2.545cm^2 area and 0.509cm^3 volume. There is bone and Fat Layer (Subcutaneous Tissue) exposed. There is a medium amount of serosanguineous drainage noted. There is medium (34-66%) red, pink granulation within the wound bed. There is a medium (34-66%) amount of necrotic tissue within the wound bed. Assessment Active Problems ICD-10 Pressure ulcer of sacral region, stage 4 Muscle weakness (generalized) Multiple myeloma not having achieved remission Procedures Wound #1 Pre-procedure diagnosis of Wound #1 is a Pressure Ulcer located on the Midline Coccyx . An CHEM CAUT GRANULATION TISS procedure was performed by Allen Derry, PA-C. Post procedure Diagnosis Wound #1: Same as Pre-Procedure Plan Follow-up Appointments: Return Appointment in 1 week. Home Health: DISCONTINUE Home Health for Wound Care. - FAMILY WILL DO DRESSING CHANGES. Bathing/ Shower/ Hygiene: May shower; gently cleanse wound with antibacterial soap, rinse and pat dry prior to dressing wounds No tub bath. Off-Loading: Low air-loss mattress (Group 2) Turn and reposition every 2 hours WOUND #1: - Coccyx Wound Laterality: Midline Cleanser: Normal Saline (Home Health) 3 x Per Week/30 Days Discharge Instructions: Wash your hands with soap and water. Remove old dressing, discard into plastic bag and place into trash. Cleanse the wound with Normal Saline prior to applying a clean dressing using gauze sponges, not tissues or cotton balls. Do not scrub or use excessive force. Pat dry using gauze sponges, not tissue or cotton balls. Prim Dressing: Hydrofera Blue Ready Transfer Foam, 2.5x2.5 (in/in) (Home Health) 3 x Per Week/30 Days ary Discharge Instructions: Apply Hydrofera Blue Ready to wound bed as directed Marhefka, Ellyson B ([id number]) [phone].pdf Page 7 of 7 Prim Dressing: Cavilon No Financial trader (Home Health) 3 x Per Week/30 Days ary Secondary Dressing: (BORDER) Zetuvit Plus SILICONE BORDER Dressing 5x5 (in/in) (Home Health) 3 x Per Week/30 Days Discharge Instructions: Please do not put silicone bordered dressings under wraps. Use non-bordered dressing only. 1. I would recommend currently that the patient should continue with the [facility] which I think is still doing a really good job. 2. I am also can recommend that we have the patient continue with the barrier film around the edges of the wound followed by the bordered foam dressing which seems to be doing well. 3. I am also can recommend continued and appropriate offloading which I think is still going to be really good for her at this point. We will see patient back for reevaluation in 1 week here in the clinic. If anything worsens or changes patient will contact our office for additional recommendations. Electronic Signature(s) Signed: 01/06/2023 12:56:39 PM By: Allen Derry PA-C Entered By: Allen Derry on 01/06/2023 12:56:39 -------------------------------------------------------------------------------- SuperBill Details Patient Name: Date of Service: Rich Number NN B. 01/06/2023 Medical Record Number: [id number] Patient Account Number: [id number] Date of Birth/Sex: Treating RN: 1941-04-08 (82 y.o. 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

## 2023-01-06 NOTE — Progress Notes (Signed)
Zetuvit Plus SILICONE BORDER Dressing 5x5 (in/in) Discharge Instruction: Please do not put silicone bordered dressings under wraps. Use non-bordered dressing only. Secured With Compression Wrap Compression Stockings Facilities manager) Signed: 01/06/2023 4:57:37 PM By: Midge Aver MSN RN CNS WTA Entered By: Midge Aver on 01/06/2023 12:36:10 -------------------------------------------------------------------------------- Vitals Details Patient Name: Date of Service: Rich Number NN B. 01/06/2023 12:15 PM Medical Record Number: [id number] Patient Account Number: [id number] Date of Birth/Sex: Treating RN: 1940/10/05 (82 y.o. [name] Primary Care Stryder Poitra: Hillard Danker Other Clinician: Referring Khaleah Duer: Treating Lynze Reddy/Extender: Madelynn Done Weeks in Treatment: 11 Vital Signs Time Taken: 12:28 Temperature (F): 98.0 Pulse (bpm): 63 Respiratory Rate (breaths/min): 18 Blood Pressure (mmHg): 195/67 Reference Range: 80 - 120 mg / dl Electronic Signature(s) Signed: 01/06/2023 4:57:37 PM By: Midge Aver MSN RN CNS WTA Entered By: Midge Aver on 01/06/2023 12:30:16  Pricilla Holm, Lakin B ([id number]) [id number] Page 1 of 8 Visit Report for 01/06/2023 Arrival Information Details Patient Name: Date of Service: JORDIN, DAMBROSIO 01/06/2023 12:15 PM Medical Record Number: [id number] Patient Account Number: [id number] Date of Birth/Sex: Treating RN: 1941/03/12 (82 y.o. [name] Primary Care Trisha Ken: Hillard Danker Other Clinician: Referring Fransico Sciandra: Treating Gleason Ardoin/Extender: Madelynn Done Weeks in Treatment: 11 Visit Information History Since Last Visit Added or deleted any medications: No Patient Arrived: Walker Any new allergies or adverse reactions: No Arrival Time: 12:25 Has Dressing in Place as Prescribed: Yes Accompanied By: daughter in law Pain Present Now: No Transfer Assistance: None Patient Identification Verified: Yes Secondary Verification Process Completed: Yes Patient Requires Transmission-Based Precautions: No Patient Has Alerts: Yes Patient Alerts: Not Diabetic Electronic Signature(s) Signed: 01/06/2023 4:57:37 PM By: Midge Aver MSN RN CNS WTA Entered By: Midge Aver on 01/06/2023 12:28:05 -------------------------------------------------------------------------------- Clinic Level of Care Assessment Details Patient Name: Date of Service: JEWELIANNA, PANCOAST NN B. 01/06/2023 12:15 PM Medical Record Number: [id number] Patient Account Number: [id number] Date of Birth/Sex: Treating RN: 25-Jun-1940 (82 y.o. [name] Primary Care Laniah Grimm: Hillard Danker Other Clinician: Referring Waynetta Metheny: Treating Rossie Scarfone/Extender: Madelynn Done Weeks in Treatment: 11 Clinic Level of Care Assessment Items TOOL 1 Quantity Score []  - 0 Use when EandM and Procedure is performed on INITIAL visit ASSESSMENTS - Nursing Assessment / Reassessment []  - 0 General Physical Exam (combine w/ comprehensive assessment (listed just below) when performed on new pt. evals) []  - 0 Comprehensive Assessment (HX, ROS, Risk  Assessments, Wounds Hx, etc.) ASSESSMENTS - Wound and Skin Assessment / Reassessment []  - 0 Dermatologic / Skin Assessment (not related to wound area) ASSESSMENTS - Ostomy and/or Continence Assessment and Care Wilmeth, Florean B ([id number]) [id number] Page 2 of 8 []  - 0 Incontinence Assessment and Management []  - 0 Ostomy Care Assessment and Management (repouching, etc.) PROCESS - Coordination of Care []  - 0 Simple Patient / Family Education for ongoing care []  - 0 Complex (extensive) Patient / Family Education for ongoing care []  - 0 Staff obtains Chiropractor, Records, T Results / Process Orders est []  - 0 Staff telephones HHA, Nursing Homes / Clarify orders / etc []  - 0 Routine Transfer to another Facility (non-emergent condition) []  - 0 Routine Hospital Admission (non-emergent condition) []  - 0 New Admissions / Manufacturing engineer / Ordering NPWT Apligraf, etc. , []  - 0 Emergency Hospital Admission (emergent condition) PROCESS - Special Needs []  - 0 Pediatric / Minor Patient Management []  - 0 Isolation Patient Management []  - 0 Hearing / Language / Visual special needs []  - 0 Assessment of Community assistance (transportation, D/C planning, etc.) []  - 0 Additional assistance / Altered mentation []  - 0 Support Surface(s) Assessment (bed, cushion, seat, etc.) INTERVENTIONS - Miscellaneous []  - 0 External ear exam []  - 0 Patient Transfer (multiple staff / Nurse, adult / Similar devices) []  - 0 Simple Staple / Suture removal (25 or less) []  - 0 Complex Staple / Suture removal (26 or more) []  - 0 Hypo/Hyperglycemic Management (do not check if billed separately) []  - 0 Ankle / Brachial Index (ABI) - do not check if billed separately Has the patient been seen at the hospital within the last three years: Yes Total Score: 0 Level Of Care: ____ Electronic Signature(s) Unsigned Previous Signature: 01/06/2023 4:57:37 PM Version By: Midge Aver  MSN RN CNS WTA Entered By: Midge Aver on 01/07/2023 12:52:02 -------------------------------------------------------------------------------- Encounter Discharge Information Details Patient Name: Date of Service:  by week 12 Date Initiated: 10/21/2022 Target Resolution Date: 01/21/2023 Goal Status: Active Ulcer/skin breakdown will heal within 14 weeks Date Initiated: 10/21/2022 Target Resolution Date: 02/04/2023 Goal Status: Active Interventions: Assess patient/caregiver ability to obtain necessary supplies Assess patient/caregiver ability to perform ulcer/skin care regimen upon admission and as needed Assess ulceration(s) every visit Provide education on ulcer and skin care Treatment Activities: Patient referred to home care : 10/21/2022 Skin care regimen initiated : 10/21/2022 Topical wound management initiated : 10/21/2022 Notes: Electronic Signature(s) Signed: 01/06/2023 4:57:37 PM By: Midge Aver MSN RN CNS WTA Entered By: Midge Aver on 01/06/2023 12:52:05 -------------------------------------------------------------------------------- Pain Assessment Details Patient Name: Date of Service: ZNIYA, COTTONE NN B. 01/06/2023 12:15 PM Medical Record Number: [id number] Patient Account Number: [id number] Date of Birth/Sex: Treating  RN: [birth year] (82 y.o. [name] Primary Care Jalicia Roszak: Hillard Danker Other Clinician: Referring Zebadiah Willert: Treating Trayce Maino/Extender: Madelynn Done Weeks in Treatment: 11 Active Problems Location of Pain Severity and Description of Pain Patient Has Paino No Site Locations [place], [place] B ([id number]) [phone].pdf Page 6 of 8 Pain Management and Medication Current Pain Management: Electronic Signature(s) Signed: 01/06/2023 4:57:37 PM By: Midge Aver MSN RN CNS WTA Entered By: Midge Aver on 01/06/2023 12:30:24 -------------------------------------------------------------------------------- Patient/Caregiver Education Details Patient Name: Date of Service: Myra Gianotti 9/30/2024andnbsp12:15 PM Medical Record Number: [id number] Patient Account Number: [id number] Date of Birth/Gender: Treating RN: [birth year] (82 y.o. [name] Primary Care Physician: Hillard Danker Other Clinician: Referring Physician: Treating Physician/Extender: Madelynn Done Weeks in Treatment: 11 Education Assessment Education Provided To: Patient Education Topics Provided Wound/Skin Impairment: Handouts: Caring for Your Ulcer Methods: Explain/Verbal Responses: State content correctly Electronic Signature(s) Signed: 01/06/2023 4:57:37 PM By: Midge Aver MSN RN CNS WTA Entered By: Midge Aver on 01/06/2023 12:52:18 Steller, Somer B ([id number]) [id number] Page 7 of 8 -------------------------------------------------------------------------------- Wound Assessment Details Patient Name: Date of Service: SUZANNE, KHO 01/06/2023 12:15 PM Medical Record Number: [id number] Patient Account Number: [id number] Date of Birth/Sex: Treating RN: 09-01-1940 (82 y.o. [name] Primary Care Kourtnei Rauber: Hillard Danker Other Clinician: Referring Randale Carvalho: Treating Marvie Calender/Extender: Madelynn Done Weeks in Treatment: 11 Wound  Status Wound Number: 1 Primary Etiology: Pressure Ulcer Wound Location: Midline Coccyx Wound Status: Open Wounding Event: Pressure Injury Comorbid History: Hypertension, Received Chemotherapy Date Acquired: 08/30/2022 Weeks Of Treatment: 11 Clustered Wound: No Photos Wound Measurements Length: (cm) 2.7 Width: (cm) 1.2 Depth: (cm) 0.2 Area: (cm) 2.545 Volume: (cm) 0.509 % Reduction in Area: 94.1% % Reduction in Volume: 99.5% Wound Description Classification: Category/Stage IV Exudate Amount: Medium Exudate Type: Serosanguineous Exudate Color: red, brown Foul Odor After Cleansing: No Slough/Fibrino Yes Wound Bed Granulation Amount: Medium (34-66%) Exposed Structure Granulation Quality: Red, Pink Fascia Exposed: No Necrotic Amount: Medium (34-66%) Fat Layer (Subcutaneous Tissue) Exposed: Yes Tendon Exposed: No Muscle Exposed: No Joint Exposed: No Bone Exposed: Yes Treatment Notes Wound #1 (Coccyx) Wound Laterality: Midline Cleanser Normal Saline Discharge Instruction: Wash your hands with soap and water. Remove old dressing, discard into plastic bag and place into trash. Cleanse the wound with Normal Saline prior to applying a clean dressing using gauze sponges, not tissues or cotton balls. Do not scrub or use excessive force. Pat dry using gauze sponges, not tissue or cotton balls. Pricilla Holm, Alora B ([id number]) [id number] Page 8 of 8 Peri-Wound Care Topical Primary Dressing Hydrofera Blue Ready Transfer Foam, 2.5x2.5 (in/in) Discharge Instruction: Apply Hydrofera Blue Ready to wound bed as directed Cavilon No Sting Environmental education officer Dressing (BORDER)  VERLEY, PARISEAU NN B. 01/06/2023 12:15 PM Medical Record Number: [id number] Patient Account Number: [id number] Date of Birth/Sex: Treating RN: May 16, 1940 (82 y.o. [name] Primary Care Malaak Stach: Hillard Danker Other Clinician: Referring Elridge Stemm: Treating Edona Schreffler/Extender: Madelynn Done Weeks in Treatment: [address] [place] B ([id number]) [id number] Page 3 of 8 Encounter Discharge Information Items Discharge Condition: Stable Ambulatory Status: Walker Discharge Destination: Home Transportation: Private Auto Accompanied By: daughter in law Schedule Follow-up Appointment: Yes Clinical Summary of Care: Electronic Signature(s) Signed: 01/07/2023 12:52:28 PM By: Midge Aver MSN RN CNS WTA Previous Signature: 01/06/2023 4:57:37 PM Version By: Midge Aver MSN RN CNS WTA Entered By: Midge Aver on 01/07/2023 12:52:28 -------------------------------------------------------------------------------- Lower Extremity Assessment Details Patient Name: Date of Service: GREENLEE, ANCHETA NN B. 01/06/2023 12:15 PM Medical Record Number: [id number] Patient Account Number: [id number] Date of Birth/Sex: Treating RN: October 01, 1940 (82 y.o. [name] Primary Care Auden Wettstein: Hillard Danker Other Clinician: Referring Courtney Bellizzi: Treating Avaleigh Decuir/Extender: Madelynn Done Weeks in Treatment: 11 Electronic Signature(s) Signed: 01/06/2023 4:57:37 PM By: Midge Aver MSN RN CNS WTA Entered By: Midge Aver on 01/06/2023 12:36:24 -------------------------------------------------------------------------------- Multi Wound Chart Details Patient Name: Date of Service: ZEPHANIAH, ENYEART NN B. 01/06/2023 12:15 PM Medical Record Number: [id number] Patient Account Number:  [id number] Date of Birth/Sex: Treating RN: 1941-04-06 (82 y.o. [name] Primary Care Terryann Verbeek: Hillard Danker Other Clinician: Referring Caven Perine: Treating Brant Peets/Extender: Madelynn Done Weeks in Treatment: 11 Vital Signs Height(in): Pulse(bpm): 63 Weight(lbs): Blood Pressure(mmHg): 195/67 Body Mass Index(BMI): Temperature(F): 98.0 Respiratory Rate(breaths/min): 18 [1:Photos:] [place], Annella B ([id number]) [1:Photos:] [N/A:N/A] Midline Coccyx N/A N/A Wound Location: Pressure Injury N/A N/A Wounding Event: Pressure Ulcer N/A N/A Primary Etiology: Hypertension, Received N/A N/A Comorbid History: Chemotherapy 08/30/2022 N/A N/A Date Acquired: 11 N/A N/A Weeks of Treatment: Open N/A N/A Wound Status: No N/A N/A Wound Recurrence: 2.7x1.2x0.2 N/A N/A Measurements L x W x D (cm) 2.545 N/A N/A A (cm) : rea 0.509 N/A N/A Volume (cm) : 94.10% N/A N/A % Reduction in A rea: 99.50% N/A N/A % 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 N/A N/A Classification: Medium N/A N/A Exudate A mount: Serosanguineous N/A N/A Exudate Type: red, brown N/A N/A Exudate Color: Medium (34-66%) N/A N/A Granulation A mount: Red, Pink N/A N/A Granulation Quality: Medium (34-66%) N/A N/A Necrotic A mount: Fat Layer (Subcutaneous Tissue): Yes N/A N/A Exposed Structures: Bone: Yes Fascia: No Tendon: No Muscle: No Joint: No Treatment Notes Electronic Signature(s) Signed: 01/06/2023 4:57:37 PM By: Midge Aver MSN RN CNS WTA Entered By: Midge Aver on 01/06/2023 12:49:10 -------------------------------------------------------------------------------- Multi-Disciplinary Care Plan Details Patient Name: Date of Service: DONAE, KUEKER NN B. 01/06/2023 12:15 PM Medical Record Number: [id number] Patient Account Number: [id number] Date of Birth/Sex: Treating RN: Jan 15, 1941 (82 y.o. [name] Primary Care Kaedyn Polivka: Hillard Danker Other Clinician: Referring Saralee Bolick: Treating  Arvie Bartholomew/Extender: Madelynn Done Weeks in Treatment: 11 Active Inactive Wound/Skin Impairment Nursing Diagnoses: Impaired tissue integrity Knowledge deficit related to ulceration/compromised skin integrity Goals: Patient/caregiver will verbalize understanding of skin care regimen [place], Siriah B ([id number]) [phone].pdf Page 5 of 8 Date Initiated: 10/21/2022 Date Inactivated: 11/25/2022 Target Resolution Date: 11/21/2022 Goal Status: Met Ulcer/skin breakdown will have a volume reduction of 30% by week 4 Date Initiated: 10/21/2022 Date Inactivated: 11/25/2022 Target Resolution Date: 11/21/2022 Goal Status: Met Ulcer/skin breakdown will have a volume reduction of 50% by week 8 Date Initiated: 10/21/2022 Date Inactivated: 12/23/2022 Target Resolution Date: 12/22/2022 Goal Status: Met Ulcer/skin breakdown will have a volume reduction of 80%

## 2023-01-07 ENCOUNTER — Telehealth: Payer: Self-pay | Admitting: Internal Medicine

## 2023-01-07 DIAGNOSIS — L89154 Pressure ulcer of sacral region, stage 4: Secondary | ICD-10-CM | POA: Diagnosis not present

## 2023-01-08 ENCOUNTER — Inpatient Hospital Stay: Payer: Medicare PPO | Attending: Internal Medicine

## 2023-01-08 ENCOUNTER — Inpatient Hospital Stay: Payer: Medicare PPO

## 2023-01-08 ENCOUNTER — Ambulatory Visit: Payer: Medicare PPO

## 2023-01-08 ENCOUNTER — Other Ambulatory Visit: Payer: Medicare PPO | Attending: Internal Medicine

## 2023-01-08 ENCOUNTER — Encounter: Payer: Self-pay | Admitting: Internal Medicine

## 2023-01-08 VITALS — BP 164/59 | HR 64 | Temp 98.4°F | Resp 18 | Wt 148.8 lb

## 2023-01-08 DIAGNOSIS — C9000 Multiple myeloma not having achieved remission: Secondary | ICD-10-CM

## 2023-01-08 DIAGNOSIS — Z79624 Long term (current) use of inhibitors of nucleotide synthesis: Secondary | ICD-10-CM | POA: Insufficient documentation

## 2023-01-08 DIAGNOSIS — Z79899 Other long term (current) drug therapy: Secondary | ICD-10-CM | POA: Diagnosis not present

## 2023-01-08 DIAGNOSIS — I129 Hypertensive chronic kidney disease with stage 1 through stage 4 chronic kidney disease, or unspecified chronic kidney disease: Secondary | ICD-10-CM | POA: Diagnosis not present

## 2023-01-08 DIAGNOSIS — Z7961 Long term (current) use of immunomodulator: Secondary | ICD-10-CM | POA: Insufficient documentation

## 2023-01-08 DIAGNOSIS — Z5112 Encounter for antineoplastic immunotherapy: Secondary | ICD-10-CM | POA: Insufficient documentation

## 2023-01-08 DIAGNOSIS — I824Z2 Acute embolism and thrombosis of unspecified deep veins of left distal lower extremity: Secondary | ICD-10-CM | POA: Diagnosis not present

## 2023-01-08 DIAGNOSIS — R109 Unspecified abdominal pain: Secondary | ICD-10-CM | POA: Insufficient documentation

## 2023-01-08 DIAGNOSIS — Z7952 Long term (current) use of systemic steroids: Secondary | ICD-10-CM | POA: Diagnosis not present

## 2023-01-08 DIAGNOSIS — E43 Unspecified severe protein-calorie malnutrition: Secondary | ICD-10-CM | POA: Diagnosis not present

## 2023-01-08 DIAGNOSIS — L89154 Pressure ulcer of sacral region, stage 4: Secondary | ICD-10-CM | POA: Diagnosis not present

## 2023-01-08 DIAGNOSIS — D630 Anemia in neoplastic disease: Secondary | ICD-10-CM | POA: Diagnosis not present

## 2023-01-08 DIAGNOSIS — C7951 Secondary malignant neoplasm of bone: Secondary | ICD-10-CM | POA: Diagnosis not present

## 2023-01-08 DIAGNOSIS — R197 Diarrhea, unspecified: Secondary | ICD-10-CM | POA: Insufficient documentation

## 2023-01-08 DIAGNOSIS — G893 Neoplasm related pain (acute) (chronic): Secondary | ICD-10-CM | POA: Diagnosis not present

## 2023-01-08 DIAGNOSIS — I272 Pulmonary hypertension, unspecified: Secondary | ICD-10-CM | POA: Diagnosis not present

## 2023-01-08 LAB — CBC WITH DIFFERENTIAL (CANCER CENTER ONLY)
Abs Immature Granulocytes: 0.1 10*3/uL — ABNORMAL HIGH (ref 0.00–0.07)
Basophils Absolute: 0.2 10*3/uL — ABNORMAL HIGH (ref 0.0–0.1)
Basophils Relative: 1 %
Eosinophils Absolute: 0.2 10*3/uL (ref 0.0–0.5)
Eosinophils Relative: 2 %
HCT: 31 % — ABNORMAL LOW (ref 36.0–46.0)
Hemoglobin: 10.2 g/dL — ABNORMAL LOW (ref 12.0–15.0)
Immature Granulocytes: 1 %
Lymphocytes Relative: 33 %
Lymphs Abs: 3.4 10*3/uL (ref 0.7–4.0)
MCH: 34.3 pg — ABNORMAL HIGH (ref 26.0–34.0)
MCHC: 32.9 g/dL (ref 30.0–36.0)
MCV: 104.4 fL — ABNORMAL HIGH (ref 80.0–100.0)
Monocytes Absolute: 1.1 10*3/uL — ABNORMAL HIGH (ref 0.1–1.0)
Monocytes Relative: 10 %
Neutro Abs: 5.6 10*3/uL (ref 1.7–7.7)
Neutrophils Relative %: 53 %
Platelet Count: 242 10*3/uL (ref 150–400)
RBC: 2.97 MIL/uL — ABNORMAL LOW (ref 3.87–5.11)
RDW: 15.6 % — ABNORMAL HIGH (ref 11.5–15.5)
WBC Count: 10.5 10*3/uL (ref 4.0–10.5)
nRBC: 0 % (ref 0.0–0.2)

## 2023-01-08 LAB — CMP (CANCER CENTER ONLY)
ALT: 16 U/L (ref 0–44)
AST: 17 U/L (ref 15–41)
Albumin: 3.5 g/dL (ref 3.5–5.0)
Alkaline Phosphatase: 156 U/L — ABNORMAL HIGH (ref 38–126)
Anion gap: 5 (ref 5–15)
BUN: 20 mg/dL (ref 8–23)
CO2: 31 mmol/L (ref 22–32)
Calcium: 9 mg/dL (ref 8.9–10.3)
Chloride: 105 mmol/L (ref 98–111)
Creatinine: 0.71 mg/dL (ref 0.44–1.00)
GFR, Estimated: >60 mL/min (ref >60)
Glucose, Bld: 103 mg/dL — ABNORMAL HIGH (ref 70–99)
Potassium: 3.8 mmol/L (ref 3.5–5.1)
Sodium: 141 mmol/L (ref 135–145)
Total Bilirubin: 0.3 mg/dL (ref 0.3–1.2)
Total Protein: 5.4 g/dL — ABNORMAL LOW (ref 6.5–8.1)

## 2023-01-08 LAB — LACTATE DEHYDROGENASE: LDH: 191 U/L (ref 98–192)

## 2023-01-08 MED ORDER — ACETAMINOPHEN 325 MG PO TABS
650.0000 mg | ORAL_TABLET | Freq: Once | ORAL | Status: AC
  Administered 2023-01-08: 650 mg via ORAL
  Filled 2023-01-08: qty 2

## 2023-01-08 MED ORDER — DARATUMUMAB-HYALURONIDASE-FIHJ 1800-30000 MG-UT/15ML ~~LOC~~ SOLN
1800.0000 mg | Freq: Once | SUBCUTANEOUS | Status: AC
  Administered 2023-01-08: 1800 mg via SUBCUTANEOUS
  Filled 2023-01-08: qty 15

## 2023-01-08 MED ORDER — DEXAMETHASONE 4 MG PO TABS
20.0000 mg | ORAL_TABLET | Freq: Once | ORAL | Status: AC
  Administered 2023-01-08: 20 mg via ORAL
  Filled 2023-01-08: qty 5

## 2023-01-08 MED ORDER — DIPHENHYDRAMINE HCL 25 MG PO CAPS
50.0000 mg | ORAL_CAPSULE | Freq: Once | ORAL | Status: AC
  Administered 2023-01-08: 50 mg via ORAL
  Filled 2023-01-08: qty 2

## 2023-01-08 NOTE — Patient Instructions (Signed)
[facility]  Discharge Instructions: Thank you for choosing [facility] to provide your oncology and hematology care.   If you have a lab appointment with the Cancer Center, please go directly to the Cancer Center and check in at the registration area.   Wear comfortable clothing and clothing appropriate for easy access to any Portacath or PICC line.   We strive to give you quality time with your provider. You may need to reschedule your appointment if you arrive late (15 or more minutes).  Arriving late affects you and other patients whose appointments are after yours.  Also, if you miss three or more appointments without notifying the office, you may be dismissed from the clinic at the provider's discretion.      For prescription refill requests, have your pharmacy contact our office and allow 72 hours for refills to be completed.    Today you received the following chemotherapy and/or immunotherapy agents: Dara Faspro.    To help prevent nausea and vomiting after your treatment, we encourage you to take your nausea medication as directed.  BELOW ARE SYMPTOMS THAT SHOULD BE REPORTED IMMEDIATELY: *FEVER GREATER THAN 100.4 F (38 C) OR HIGHER *CHILLS OR SWEATING *NAUSEA AND VOMITING THAT IS NOT CONTROLLED WITH YOUR NAUSEA MEDICATION *UNUSUAL SHORTNESS OF BREATH *UNUSUAL BRUISING OR BLEEDING *URINARY PROBLEMS (pain or burning when urinating, or frequent urination) *BOWEL PROBLEMS (unusual diarrhea, constipation, pain near the anus) TENDERNESS IN MOUTH AND THROAT WITH OR WITHOUT PRESENCE OF ULCERS (sore throat, sores in mouth, or a toothache) UNUSUAL RASH, SWELLING OR PAIN  UNUSUAL VAGINAL DISCHARGE OR ITCHING   Items with * indicate a potential emergency and should be followed up as soon as possible or go to the Emergency Department if any problems should occur.  Please show the CHEMOTHERAPY ALERT CARD or IMMUNOTHERAPY ALERT CARD at  check-in to the Emergency Department and triage nurse.  Should you have questions after your visit or need to cancel or reschedule your appointment, please contact [facility]  Dept: [phone]  and follow the prompts.  Office hours are 8:00 a.m. to 4:30 p.m. Monday - Friday. Please note that voicemails left after 4:00 p.m. may not be returned until the following business day.  We are closed weekends and major holidays. You have access to a nurse at all times for urgent questions. Please call the main number to the clinic Dept: [phone] and follow the prompts.   For any non-urgent questions, you may also contact your provider using MyChart. We now offer e-Visits for anyone 75 and older to request care online for non-urgent symptoms. For details visit [email or web address].   Also download the MyChart app! Go to the app store, search "MyChart", open the app, select [place], and log in with your MyChart username and password.

## 2023-01-13 ENCOUNTER — Encounter: Payer: Medicare PPO | Attending: Physician Assistant | Admitting: Physician Assistant

## 2023-01-13 DIAGNOSIS — C9000 Multiple myeloma not having achieved remission: Secondary | ICD-10-CM | POA: Insufficient documentation

## 2023-01-13 DIAGNOSIS — L89153 Pressure ulcer of sacral region, stage 3: Secondary | ICD-10-CM | POA: Diagnosis not present

## 2023-01-13 DIAGNOSIS — M6281 Muscle weakness (generalized): Secondary | ICD-10-CM | POA: Insufficient documentation

## 2023-01-13 DIAGNOSIS — L89154 Pressure ulcer of sacral region, stage 4: Secondary | ICD-10-CM | POA: Diagnosis not present

## 2023-01-13 NOTE — Progress Notes (Signed)
worsening in general. 01-13-2023 upon evaluation today patient appears to be doing excellent in  regards to her wound which is actually showing signs of great improvement. The hypergranulation is greatly improved compared to last week after the silver nitrate that there is a little bit of necrotic tissue to remove that something I think I can do today without any significant complication. She is in agreement with that plan. Objective Constitutional Well-nourished and well-hydrated in no acute distress. Vitals Time Taken: 12:25 PM, Temperature: 98.2 F, Pulse: 76 bpm, Respiratory Rate: 16 breaths/min, Blood Pressure: 165/78 mmHg. Respiratory normal breathing without difficulty. Psychiatric this patient is able to make decisions and demonstrates good insight into disease process. Alert and Oriented x 3. pleasant and cooperative. General Notes: Upon inspection patient's wound bed actually showed signs of good granulation epithelization which is some slough and biofilm noted I did perform debridement to clear this away she tolerated that without complication and postdebridement the wound bed is significantly improved which is great news. Integumentary (Hair, Skin) Wound #1 status is Open. Original cause of wound was Pressure Injury. The date acquired was: 08/30/2022. The wound has been in treatment 12 weeks. The wound is located on the Midline Coccyx. The wound measures 2.6cm length x 1cm width x 0.2cm depth; 2.042cm^2 area and 0.408cm^3 volume. There is bone and Fat Layer (Subcutaneous Tissue) exposed. There is a medium amount of serosanguineous drainage noted. There is medium (34-66%) red, pink granulation within the wound bed. There is a medium (34-66%) amount of necrotic tissue within the wound bed. Assessment Active Problems ICD-10 Pressure ulcer of sacral region, stage 4 Muscle weakness (generalized) Multiple myeloma not having achieved remission Procedures [name], [name] ([id number]) [phone].pdf Page 7 of 8 Wound #1 Pre-procedure diagnosis of Wound #1 is a  Pressure Ulcer located on the Midline Coccyx . There was a Excisional Skin/Subcutaneous Tissue Debridement with a total area of 2.04 sq cm performed by Allen Derry, PA-C. With the following instrument(s): Curette to remove Viable and Non-Viable tissue/material. Material removed includes Subcutaneous Tissue and Slough and. No specimens were taken. A time out was conducted at 12:35, prior to the start of the procedure. A Minimum amount of bleeding was controlled with Pressure. The procedure was tolerated well with a pain level of 0 throughout and a pain level of 0 following the procedure. Post Debridement Measurements: 2.6cm length x 1cm width x 0.2cm depth; 0.408cm^3 volume. Post debridement Stage noted as Category/Stage IV. Character of Wound/Ulcer Post Debridement is stable. Post procedure Diagnosis Wound #1: Same as Pre-Procedure Plan 1. I would recommend that the patient should continue to monitor for any signs of infection or worsening. If anything changes she knows to contact the office let me know. 2. I am going to recommend as well that the patient should continue to utilize the [facility] along with a bordered foam dressing which I think is doing extremely well for her. 3. I am also going to suggest that she should continue to offload is much as she can the more that she can do the better. We will see patient back for reevaluation in 1 week here in the clinic. If anything worsens or changes patient will contact our office for additional recommendations. Electronic Signature(s) Signed: 01/13/2023 12:38:49 PM By: Allen Derry PA-C Entered By: Allen Derry on 01/13/2023 12:38:49 -------------------------------------------------------------------------------- SuperBill Details Patient Name: Date of Service: [name] 01/13/2023 Medical Record Number: [id number] Patient Account Number: [id number] Date of Birth/Sex: Treating RN: 07-24-1940 (82 y.o. F)  worsening in general. 01-13-2023 upon evaluation today patient appears to be doing excellent in  regards to her wound which is actually showing signs of great improvement. The hypergranulation is greatly improved compared to last week after the silver nitrate that there is a little bit of necrotic tissue to remove that something I think I can do today without any significant complication. She is in agreement with that plan. Objective Constitutional Well-nourished and well-hydrated in no acute distress. Vitals Time Taken: 12:25 PM, Temperature: 98.2 F, Pulse: 76 bpm, Respiratory Rate: 16 breaths/min, Blood Pressure: 165/78 mmHg. Respiratory normal breathing without difficulty. Psychiatric this patient is able to make decisions and demonstrates good insight into disease process. Alert and Oriented x 3. pleasant and cooperative. General Notes: Upon inspection patient's wound bed actually showed signs of good granulation epithelization which is some slough and biofilm noted I did perform debridement to clear this away she tolerated that without complication and postdebridement the wound bed is significantly improved which is great news. Integumentary (Hair, Skin) Wound #1 status is Open. Original cause of wound was Pressure Injury. The date acquired was: 08/30/2022. The wound has been in treatment 12 weeks. The wound is located on the Midline Coccyx. The wound measures 2.6cm length x 1cm width x 0.2cm depth; 2.042cm^2 area and 0.408cm^3 volume. There is bone and Fat Layer (Subcutaneous Tissue) exposed. There is a medium amount of serosanguineous drainage noted. There is medium (34-66%) red, pink granulation within the wound bed. There is a medium (34-66%) amount of necrotic tissue within the wound bed. Assessment Active Problems ICD-10 Pressure ulcer of sacral region, stage 4 Muscle weakness (generalized) Multiple myeloma not having achieved remission Procedures [name], [name] ([id number]) [phone].pdf Page 7 of 8 Wound #1 Pre-procedure diagnosis of Wound #1 is a  Pressure Ulcer located on the Midline Coccyx . There was a Excisional Skin/Subcutaneous Tissue Debridement with a total area of 2.04 sq cm performed by Allen Derry, PA-C. With the following instrument(s): Curette to remove Viable and Non-Viable tissue/material. Material removed includes Subcutaneous Tissue and Slough and. No specimens were taken. A time out was conducted at 12:35, prior to the start of the procedure. A Minimum amount of bleeding was controlled with Pressure. The procedure was tolerated well with a pain level of 0 throughout and a pain level of 0 following the procedure. Post Debridement Measurements: 2.6cm length x 1cm width x 0.2cm depth; 0.408cm^3 volume. Post debridement Stage noted as Category/Stage IV. Character of Wound/Ulcer Post Debridement is stable. Post procedure Diagnosis Wound #1: Same as Pre-Procedure Plan 1. I would recommend that the patient should continue to monitor for any signs of infection or worsening. If anything changes she knows to contact the office let me know. 2. I am going to recommend as well that the patient should continue to utilize the [facility] along with a bordered foam dressing which I think is doing extremely well for her. 3. I am also going to suggest that she should continue to offload is much as she can the more that she can do the better. We will see patient back for reevaluation in 1 week here in the clinic. If anything worsens or changes patient will contact our office for additional recommendations. Electronic Signature(s) Signed: 01/13/2023 12:38:49 PM By: Allen Derry PA-C Entered By: Allen Derry on 01/13/2023 12:38:49 -------------------------------------------------------------------------------- SuperBill Details Patient Name: Date of Service: [name] 01/13/2023 Medical Record Number: [id number] Patient Account Number: [id number] Date of Birth/Sex: Treating RN: 07-24-1940 (82 y.o. F)  Cavilon No Sting Barrier Film ary 3 x Per Week/30 Days Secondary Dressing: (BORDER) Zetuvit Plus SILICONE BORDER Dressing 5x5 (in/in) (Generic) 3 x Per Week/30 Days Discharge Instructions: Please do not put silicone bordered dressings under wraps. Use non-bordered dressing only. Electronic Signature(s) Signed: 01/13/2023 3:41:41 PM By: Allen Derry PA-C Signed: 01/14/2023 5:02:18 PM By: Midge Aver MSN RN CNS WTA Entered By: Midge Aver on 01/13/2023 12:45:02 -------------------------------------------------------------------------------- Problem List Details Patient Name: Date of Service: [name], [name] [name] 01/13/2023 12:15 PM Medical Record Number: [id number] Patient Account Number: [id number] Date of Birth/Sex: Treating RN: [birth year] (82 y.o. [name] Primary Care Provider: Hillard Danker Other Clinician: Referring Provider: Treating Provider/Extender: [name], [name] ([id number]) [phone].pdf Page 5 of 8 Weeks in Treatment: 12 Active Problems ICD-10 Encounter Code Description Active Date MDM Diagnosis L89.154 Pressure ulcer of sacral region, stage 4 10/21/2022 No Yes M62.81 Muscle weakness (generalized) 10/21/2022 No Yes C90.00 Multiple myeloma not having achieved remission 10/21/2022 No Yes Inactive Problems Resolved Problems Electronic Signature(s) Signed: 01/13/2023 12:22:00 PM By: Allen Derry PA-C Entered By: Allen Derry on 01/13/2023 12:21:59 -------------------------------------------------------------------------------- Progress Note Details Patient Name: Date of Service: [name], [name] [name] 01/13/2023 12:15 PM Medical Record Number: [id number] Patient Account Number: [id number] Date of Birth/Sex: Treating RN: 04-12-1940 (82 y.o. [name] Primary Care Provider: Hillard Danker Other Clinician: Referring Provider: Treating Provider/Extender: Madelynn Done Weeks in Treatment: 12 Subjective Chief Complaint Information obtained from Patient Stage 4 pressure ulcer History of Present Illness (HPI) 10-21-2022 upon evaluation today patient appears to be doing pretty well in general in regard to a fairly significant however pressure ulcer in the midline sacral/coccyx region which is definitely stage IV. Currently there is not direct bone exposure because she is now covered with granulation tissue her family has been taking care of her at home at this point although she was previously in a facility. This is something that was noted when she ended up having to go to the ER initially on Aug 30, 2022. Subsequently during hospital stent it was started that she had gentamicin applied topically along with Dakin's moistened gauze packing which has been done at home now and seems to be doing very well. She was at [facility] skilled nursing facility for time. She did have Levaquin previous which she did do very well for her as far as get the infection under control in my opinion I think she is actually seeming to do quite well at this point. She is not having any pain which is also excellent news at this time. She is starting to get around more so she is not completely bedbound although she is a little weak she does have family that is very attentive and taking great care of her. 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 There is an area of bone that is very thinly covered. Been using Dakin's wet-to-dry dressings. She has in-home caregivers 24/7 and also home health coming out to change dressings/participate in dressing supervision although most of her  dressing changes are being done by her own in-home caregivers. 11-04-2022 upon evaluation today patient appears to be doing well currently in regard to her wound. With that being said I do feel like she is making really good progress towards healing currently. I do not see any evidence of  [name], [name] ([id number]) [id number] Page 1 of 8 Visit Report for 01/13/2023 Chief Complaint Document Details Patient Name: Date of Service: [name], [name] 01/13/2023 12:15 PM Medical Record Number: [id number] Patient Account Number: [id number] Date of Birth/Sex: Treating RN: 1940/12/22 (82 y.o. [name] Primary Care Provider: Hillard Danker Other Clinician: Referring Provider: Treating Provider/Extender: Madelynn Done Weeks in Treatment: 12 Information Obtained from: Patient Chief Complaint Stage 4 pressure ulcer Electronic Signature(s) Signed: 01/13/2023 12:22:05 PM By: Allen Derry PA-C Entered By: Allen Derry on 01/13/2023 12:22:05 -------------------------------------------------------------------------------- Debridement Details Patient Name: Date of Service: [name] 01/13/2023 12:15 PM Medical Record Number: [id number] Patient Account Number: [id number] Date of Birth/Sex: Treating RN: Oct 05, 1940 (82 y.o. [name] Primary Care Provider: Hillard Danker Other Clinician: Referring Provider: Treating Provider/Extender: Madelynn Done Weeks in Treatment: 12 Debridement Performed for Assessment: Wound #1 Midline Coccyx Performed By: Physician Allen Derry, PA-C Debridement Type: Debridement Level of Consciousness (Pre-procedure): Awake and Alert Pre-procedure Verification/Time Out Yes - 12:35 Taken: Start Time: 12:35 Percent of Wound Bed Debrided: 100% T Area Debrided (cm): otal 2.04 Tissue and other material debrided: Viable, Non-Viable, Slough, Subcutaneous, Slough Level: Skin/Subcutaneous Tissue Debridement Description: Excisional Instrument: Curette Bleeding: Minimum Hemostasis Achieved: Pressure Procedural Pain: 0 Post Procedural Pain: 0 Response to Treatment: Procedure was tolerated well Level of Consciousness (Post- [name], [name] ([id number]) [phone].pdf Page 2 of 8 Level of  Consciousness (Post- Awake and Alert procedure): Post Debridement Measurements of Total Wound Length: (cm) 2.6 Stage: Category/Stage IV Width: (cm) 1 Depth: (cm) 0.2 Volume: (cm) 0.408 Character of Wound/Ulcer Post Debridement: Stable Post Procedure Diagnosis Same as Pre-procedure Electronic Signature(s) Signed: 01/13/2023 3:41:41 PM By: Allen Derry PA-C Signed: 01/14/2023 5:02:18 PM By: Midge Aver MSN RN CNS WTA Entered By: Midge Aver on 01/13/2023 12:37:43 -------------------------------------------------------------------------------- HPI Details Patient Name: Date of Service: [name] 01/13/2023 12:15 PM Medical Record Number: [id number] Patient Account Number: [id number] Date of Birth/Sex: Treating RN: 1940/11/21 (82 y.o. 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 Currently there is not direct bone exposure because she is now covered with granulation tissue her family has been taking care of her at home at this point although she was previously in a facility. This is something that was noted when she ended up having to go to the ER initially on Aug 30, 2022. Subsequently during hospital stent it was started that she had gentamicin applied topically along with Dakin's moistened gauze packing which has been done at home now and seems to be doing very well. She was at [facility] skilled nursing facility for time. She did have Levaquin previous which she did do very well for her as far as get the infection under control in my opinion I think she is actually seeming to do quite well at this point. She is not  having any pain which is also excellent news at this time. She is starting to get around more so she is not completely bedbound although she is a little weak she does have family that is very attentive and taking great care of her. Patient does have multiple myeloma and generalized muscle weakness but otherwise no major  Cavilon No Sting Barrier Film ary 3 x Per Week/30 Days Secondary Dressing: (BORDER) Zetuvit Plus SILICONE BORDER Dressing 5x5 (in/in) (Generic) 3 x Per Week/30 Days Discharge Instructions: Please do not put silicone bordered dressings under wraps. Use non-bordered dressing only. Electronic Signature(s) Signed: 01/13/2023 3:41:41 PM By: Allen Derry PA-C Signed: 01/14/2023 5:02:18 PM By: Midge Aver MSN RN CNS WTA Entered By: Midge Aver on 01/13/2023 12:45:02 -------------------------------------------------------------------------------- Problem List Details Patient Name: Date of Service: [name], [name] [name] 01/13/2023 12:15 PM Medical Record Number: [id number] Patient Account Number: [id number] Date of Birth/Sex: Treating RN: [birth year] (82 y.o. [name] Primary Care Provider: Hillard Danker Other Clinician: Referring Provider: Treating Provider/Extender: [name], [name] ([id number]) [phone].pdf Page 5 of 8 Weeks in Treatment: 12 Active Problems ICD-10 Encounter Code Description Active Date MDM Diagnosis L89.154 Pressure ulcer of sacral region, stage 4 10/21/2022 No Yes M62.81 Muscle weakness (generalized) 10/21/2022 No Yes C90.00 Multiple myeloma not having achieved remission 10/21/2022 No Yes Inactive Problems Resolved Problems Electronic Signature(s) Signed: 01/13/2023 12:22:00 PM By: Allen Derry PA-C Entered By: Allen Derry on 01/13/2023 12:21:59 -------------------------------------------------------------------------------- Progress Note Details Patient Name: Date of Service: [name], [name] [name] 01/13/2023 12:15 PM Medical Record Number: [id number] Patient Account Number: [id number] Date of Birth/Sex: Treating RN: 04-12-1940 (82 y.o. [name] Primary Care Provider: Hillard Danker Other Clinician: Referring Provider: Treating Provider/Extender: Madelynn Done Weeks in Treatment: 12 Subjective Chief Complaint Information obtained from Patient Stage 4 pressure ulcer History of Present Illness (HPI) 10-21-2022 upon evaluation today patient appears to be doing pretty well in general in regard to a fairly significant however pressure ulcer in the midline sacral/coccyx region which is definitely stage IV. Currently there is not direct bone exposure because she is now covered with granulation tissue her family has been taking care of her at home at this point although she was previously in a facility. This is something that was noted when she ended up having to go to the ER initially on Aug 30, 2022. Subsequently during hospital stent it was started that she had gentamicin applied topically along with Dakin's moistened gauze packing which has been done at home now and seems to be doing very well. She was at [facility] skilled nursing facility for time. She did have Levaquin previous which she did do very well for her as far as get the infection under control in my opinion I think she is actually seeming to do quite well at this point. She is not having any pain which is also excellent news at this time. She is starting to get around more so she is not completely bedbound although she is a little weak she does have family that is very attentive and taking great care of her. 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 There is an area of bone that is very thinly covered. Been using Dakin's wet-to-dry dressings. She has in-home caregivers 24/7 and also home health coming out to change dressings/participate in dressing supervision although most of her  dressing changes are being done by her own in-home caregivers. 11-04-2022 upon evaluation today patient appears to be doing well currently in regard to her wound. With that being said I do feel like she is making really good progress towards healing currently. I do not see any evidence of  least. On hold this week and see where things stand she  is in agreement with that plan. 12-30-2022 upon evaluation today patient appears to be doing well currently in regard to her wound which is actually showing signs of excellent improvement. Fortunately I do not see any evidence of worsening overall and I do believe that the patient is making great headway towards complete closure. 01-06-2023 upon evaluation today patient appears to be doing well currently in regard to her wound on the sacral area this is actually showing signs of excellent improvement and actually very pleased with where we stand. Fortunately I do not see any signs of worsening in general. 01-13-2023 upon evaluation today patient appears to be doing excellent in regards to her wound which is actually showing signs of great improvement. The hypergranulation is greatly improved compared to last week after the silver nitrate that there is a little bit of necrotic tissue to remove that something I think I can do today without any significant complication. She is in agreement with that plan. Electronic Signature(s) Signed: 01/13/2023 12:37:48 PM By: Allen Derry PA-C Entered By: Allen Derry on 01/13/2023 12:37:48 -------------------------------------------------------------------------------- Physical Exam Details Patient Name: Date of Service: [name], [name] [name] 01/13/2023 12:15 PM Medical Record Number: [id number] Patient Account Number: [id number] Date of Birth/Sex: Treating RN: January 12, 1941 (82 y.o. [name] Primary Care Provider: Hillard Danker Other Clinician: Referring Provider: Treating Provider/Extender: Madelynn Done Weeks in Treatment: 12 Constitutional Well-nourished and well-hydrated in no acute distress. Respiratory normal breathing without difficulty. Psychiatric this patient is able to make decisions and demonstrates good insight into disease process. Alert and Oriented x 3. pleasant and cooperative. Notes Upon inspection patient's wound bed actually showed  signs of good granulation epithelization which is some slough and biofilm noted I did perform debridement to clear this away she tolerated that without complication and postdebridement the wound bed is significantly improved which is great news. Electronic Signature(s) Signed: 01/13/2023 12:38:05 PM By: Allen Derry PA-C Entered By: Allen Derry on 01/13/2023 12:38:05 [name], [name] ([id number]) [id number] Page 4 of 8 -------------------------------------------------------------------------------- Physician Orders Details Patient Name: Date of Service: [name], [name] 01/13/2023 12:15 PM Medical Record Number: [id number] Patient Account Number: [id number] Date of Birth/Sex: Treating RN: 03/22/1941 (82 y.o. [name] Primary Care Provider: Hillard Danker Other Clinician: Referring Provider: Treating Provider/Extender: Madelynn Done Weeks in Treatment: 12 Verbal / Phone Orders: No Diagnosis Coding ICD-10 Coding Code Description L89.154 Pressure ulcer of sacral region, stage 4 M62.81 Muscle weakness (generalized) C90.00 Multiple myeloma not having achieved remission Follow-up Appointments Return Appointment in 1 week. Home Health DISCONTINUE Home Health for Wound Care. - FAMILY WILL DO DRESSING CHANGES. Bathing/ Shower/ Hygiene May shower; gently cleanse wound with antibacterial soap, rinse and pat dry prior to dressing wounds No tub bath. Off-Loading Low air-loss mattress (Group 2) Turn and reposition every 2 hours Wound Treatment Wound #1 - Coccyx Wound Laterality: Midline Cleanser: Normal Saline 3 x Per Week/30 Days Discharge Instructions: Wash your hands with soap and water. Remove old dressing, discard into plastic bag and place into trash. Cleanse the wound with Normal Saline prior to applying a clean dressing using gauze sponges, not tissues or cotton balls. Do not scrub or use excessive force. Pat dry using gauze sponges, not tissue or  cotton balls. Prim Dressing: Hydrofera Blue Ready Transfer Foam, 2.5x2.5 (in/in) 3 x Per Week/30 Days ary Discharge Instructions: Apply Hydrofera Blue Ready to wound bed as directed Prim Dressing:  [name], [name] ([id number]) [id number] Page 1 of 8 Visit Report for 01/13/2023 Chief Complaint Document Details Patient Name: Date of Service: [name], [name] 01/13/2023 12:15 PM Medical Record Number: [id number] Patient Account Number: [id number] Date of Birth/Sex: Treating RN: 1940/12/22 (82 y.o. [name] Primary Care Provider: Hillard Danker Other Clinician: Referring Provider: Treating Provider/Extender: Madelynn Done Weeks in Treatment: 12 Information Obtained from: Patient Chief Complaint Stage 4 pressure ulcer Electronic Signature(s) Signed: 01/13/2023 12:22:05 PM By: Allen Derry PA-C Entered By: Allen Derry on 01/13/2023 12:22:05 -------------------------------------------------------------------------------- Debridement Details Patient Name: Date of Service: [name] 01/13/2023 12:15 PM Medical Record Number: [id number] Patient Account Number: [id number] Date of Birth/Sex: Treating RN: Oct 05, 1940 (82 y.o. [name] Primary Care Provider: Hillard Danker Other Clinician: Referring Provider: Treating Provider/Extender: Madelynn Done Weeks in Treatment: 12 Debridement Performed for Assessment: Wound #1 Midline Coccyx Performed By: Physician Allen Derry, PA-C Debridement Type: Debridement Level of Consciousness (Pre-procedure): Awake and Alert Pre-procedure Verification/Time Out Yes - 12:35 Taken: Start Time: 12:35 Percent of Wound Bed Debrided: 100% T Area Debrided (cm): otal 2.04 Tissue and other material debrided: Viable, Non-Viable, Slough, Subcutaneous, Slough Level: Skin/Subcutaneous Tissue Debridement Description: Excisional Instrument: Curette Bleeding: Minimum Hemostasis Achieved: Pressure Procedural Pain: 0 Post Procedural Pain: 0 Response to Treatment: Procedure was tolerated well Level of Consciousness (Post- [name], [name] ([id number]) [phone].pdf Page 2 of 8 Level of  Consciousness (Post- Awake and Alert procedure): Post Debridement Measurements of Total Wound Length: (cm) 2.6 Stage: Category/Stage IV Width: (cm) 1 Depth: (cm) 0.2 Volume: (cm) 0.408 Character of Wound/Ulcer Post Debridement: Stable Post Procedure Diagnosis Same as Pre-procedure Electronic Signature(s) Signed: 01/13/2023 3:41:41 PM By: Allen Derry PA-C Signed: 01/14/2023 5:02:18 PM By: Midge Aver MSN RN CNS WTA Entered By: Midge Aver on 01/13/2023 12:37:43 -------------------------------------------------------------------------------- HPI Details Patient Name: Date of Service: [name] 01/13/2023 12:15 PM Medical Record Number: [id number] Patient Account Number: [id number] Date of Birth/Sex: Treating RN: 1940/11/21 (82 y.o. 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 Currently there is not direct bone exposure because she is now covered with granulation tissue her family has been taking care of her at home at this point although she was previously in a facility. This is something that was noted when she ended up having to go to the ER initially on Aug 30, 2022. Subsequently during hospital stent it was started that she had gentamicin applied topically along with Dakin's moistened gauze packing which has been done at home now and seems to be doing very well. She was at [facility] skilled nursing facility for time. She did have Levaquin previous which she did do very well for her as far as get the infection under control in my opinion I think she is actually seeming to do quite well at this point. She is not  having any pain which is also excellent news at this time. She is starting to get around more so she is not completely bedbound although she is a little weak she does have family that is very attentive and taking great care of her. 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

## 2023-01-13 NOTE — Progress Notes (Signed)
Chip Boer Primary Care Rajon Bisig: Hillard Danker Other Clinician: Referring Zuha Dejonge: Treating [name]/Extender: Marcelyn Ditty, Danyel [name] ([id number]) [id number] Page 3 of 8 Weeks in Treatment: 12 Encounter Discharge Information Items Post Procedure Vitals Discharge Condition: Stable Temperature (F): 98.2 Ambulatory Status: Walker Pulse (bpm): 76 Discharge Destination: Home Respiratory Rate (breaths/min): 16 Transportation: Private Auto Blood Pressure (mmHg): 165/78 Accompanied By: caretaker Schedule Follow-up Appointment: Yes Clinical Summary of Care: Electronic Signature(s) Unsigned Entered By: Midge Aver on 01/13/2023 09:46:53 -------------------------------------------------------------------------------- Lower Extremity Assessment Details Patient Name: Date of Service: [name], [name] [name] 01/13/2023 12:15 PM Medical Record Number: [id number] Patient Account Number: [id number] Date of Birth/Sex: Treating RN: 02/19/1941 (82 y.o. [name] Primary Care Shantrice Rodenberg: Hillard Danker Other Clinician: Referring Rowe Warman: Treating Margaux Engen/Extender: Madelynn Done Weeks in Treatment: 12 Electronic Signature(s) Unsigned Entered By: Midge Aver on 01/13/2023 09:28:24 -------------------------------------------------------------------------------- Multi Wound Chart Details Patient Name: Date of Service: [name], [name] [name] 01/13/2023 12:15 PM Medical Record Number: [id number] Patient Account Number: [id number] Date of Birth/Sex: Treating RN: 05/12/1940 (82 y.o. [name] Primary Care [name]: Hillard Danker Other  Clinician: Referring Anevay Campanella: Treating [name]/Extender: Madelynn Done Weeks in Treatment: 12 Vital Signs Height(in): Pulse(bpm): 76 Weight(lbs): Blood Pressure(mmHg): 165/78 Body Mass Index(BMI): Temperature(F): 98.2 [name], [name] ([id number]) [phone].pdf Page 4 of 8 Respiratory Rate(breaths/min): 16 [1:Photos:] [N/A:N/A] Midline Coccyx N/A N/A Wound Location: Pressure Injury N/A N/A Wounding Event: Pressure Ulcer N/A N/A Primary Etiology: Hypertension, Received N/A N/A Comorbid History: Chemotherapy 08/30/2022 N/A N/A Date Acquired: 12 N/A N/A Weeks of Treatment: Open N/A N/A Wound Status: No N/A N/A Wound Recurrence: 2.6x1x0.2 N/A N/A Measurements L x W x D (cm) 2.042 N/A N/A A (cm) : rea 0.408 N/A N/A Volume (cm) : 95.30% N/A N/A % Reduction in A rea: 99.60% N/A N/A % 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 N/A N/A Classification: Medium N/A N/A Exudate A mount: Serosanguineous N/A N/A Exudate Type: red, brown N/A N/A Exudate Color: Medium (34-66%) N/A N/A Granulation A mount: Red, Pink N/A N/A Granulation Quality: Medium (34-66%) N/A N/A Necrotic A mount: Fat Layer (Subcutaneous Tissue): Yes N/A N/A Exposed Structures: Bone: Yes Fascia: No Tendon: No Muscle: No Joint: No Treatment Notes Electronic Signature(s) Unsigned Entered By: Midge Aver on 01/13/2023 09:34:39 -------------------------------------------------------------------------------- Multi-Disciplinary Care Plan Details Patient Name: Date of Service: [name], [name] [name] 01/13/2023 12:15 PM Medical Record Number: [id number] Patient Account Number: [id number] Date of Birth/Sex: Treating RN: 1940-09-07 (82 y.o. [name] Primary Care [name]: Hillard Danker Other Clinician: Referring Buddie Marston: Treating [name]/Extender: Madelynn Done Weeks in Treatment: [address] Inactive Wound/Skin Impairment [place], [name] ([id number])  [id number] Page 5 of 8 Nursing Diagnoses: Impaired tissue integrity Knowledge deficit related to ulceration/compromised skin integrity Goals: Patient/caregiver will verbalize understanding of skin care regimen Date Initiated: 10/21/2022 Date Inactivated: 11/25/2022 Target Resolution Date: 11/21/2022 Goal Status: Met Ulcer/skin breakdown will have a volume reduction of 30% by week 4 Date Initiated: 10/21/2022 Date Inactivated: 11/25/2022 Target Resolution Date: 11/21/2022 Goal Status: Met Ulcer/skin breakdown will have a volume reduction of 50% by week 8 Date Initiated: 10/21/2022 Date Inactivated: 12/23/2022 Target Resolution Date: 12/22/2022 Goal Status: Met Ulcer/skin breakdown will have a volume reduction of 80% by week 12 Date Initiated: 10/21/2022 Date Inactivated: 01/13/2023 Target Resolution Date: 01/21/2023 Goal Status: Met Ulcer/skin breakdown will heal within 14 weeks Date Initiated: 10/21/2022 Target Resolution Date: 02/04/2023 Goal Status: Active Interventions: Assess patient/caregiver ability to obtain necessary supplies Assess patient/caregiver ability to perform ulcer/skin care regimen  Date of Birth/Sex: Treating RN: May 16, 1940 (82 y.o. [name] Primary Care Kileigh Ortmann: Hillard Danker Other Clinician: Referring Brogan Martis: Treating Ellianne Gowen/Extender: Madelynn Done Weeks in Treatment: 12 Vital Signs Time Taken: 12:25 Temperature (F): 98.2 Pulse (bpm): 76 Respiratory Rate (breaths/min): 16 Blood Pressure (mmHg): 165/78 Reference Range: 80 - 120 mg / dl Electronic Signature(s) Unsigned Entered By: Midge Aver on 01/13/2023 09:26:17 Signature(s): Date(s):  Chip Boer Primary Care Rajon Bisig: Hillard Danker Other Clinician: Referring Zuha Dejonge: Treating [name]/Extender: Marcelyn Ditty, Danyel [name] ([id number]) [id number] Page 3 of 8 Weeks in Treatment: 12 Encounter Discharge Information Items Post Procedure Vitals Discharge Condition: Stable Temperature (F): 98.2 Ambulatory Status: Walker Pulse (bpm): 76 Discharge Destination: Home Respiratory Rate (breaths/min): 16 Transportation: Private Auto Blood Pressure (mmHg): 165/78 Accompanied By: caretaker Schedule Follow-up Appointment: Yes Clinical Summary of Care: Electronic Signature(s) Unsigned Entered By: Midge Aver on 01/13/2023 09:46:53 -------------------------------------------------------------------------------- Lower Extremity Assessment Details Patient Name: Date of Service: [name], [name] [name] 01/13/2023 12:15 PM Medical Record Number: [id number] Patient Account Number: [id number] Date of Birth/Sex: Treating RN: 02/19/1941 (82 y.o. [name] Primary Care Shantrice Rodenberg: Hillard Danker Other Clinician: Referring Rowe Warman: Treating Margaux Engen/Extender: Madelynn Done Weeks in Treatment: 12 Electronic Signature(s) Unsigned Entered By: Midge Aver on 01/13/2023 09:28:24 -------------------------------------------------------------------------------- Multi Wound Chart Details Patient Name: Date of Service: [name], [name] [name] 01/13/2023 12:15 PM Medical Record Number: [id number] Patient Account Number: [id number] Date of Birth/Sex: Treating RN: 05/12/1940 (82 y.o. [name] Primary Care [name]: Hillard Danker Other  Clinician: Referring Anevay Campanella: Treating [name]/Extender: Madelynn Done Weeks in Treatment: 12 Vital Signs Height(in): Pulse(bpm): 76 Weight(lbs): Blood Pressure(mmHg): 165/78 Body Mass Index(BMI): Temperature(F): 98.2 [name], [name] ([id number]) [phone].pdf Page 4 of 8 Respiratory Rate(breaths/min): 16 [1:Photos:] [N/A:N/A] Midline Coccyx N/A N/A Wound Location: Pressure Injury N/A N/A Wounding Event: Pressure Ulcer N/A N/A Primary Etiology: Hypertension, Received N/A N/A Comorbid History: Chemotherapy 08/30/2022 N/A N/A Date Acquired: 12 N/A N/A Weeks of Treatment: Open N/A N/A Wound Status: No N/A N/A Wound Recurrence: 2.6x1x0.2 N/A N/A Measurements L x W x D (cm) 2.042 N/A N/A A (cm) : rea 0.408 N/A N/A Volume (cm) : 95.30% N/A N/A % Reduction in A rea: 99.60% N/A N/A % 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 N/A N/A Classification: Medium N/A N/A Exudate A mount: Serosanguineous N/A N/A Exudate Type: red, brown N/A N/A Exudate Color: Medium (34-66%) N/A N/A Granulation A mount: Red, Pink N/A N/A Granulation Quality: Medium (34-66%) N/A N/A Necrotic A mount: Fat Layer (Subcutaneous Tissue): Yes N/A N/A Exposed Structures: Bone: Yes Fascia: No Tendon: No Muscle: No Joint: No Treatment Notes Electronic Signature(s) Unsigned Entered By: Midge Aver on 01/13/2023 09:34:39 -------------------------------------------------------------------------------- Multi-Disciplinary Care Plan Details Patient Name: Date of Service: [name], [name] [name] 01/13/2023 12:15 PM Medical Record Number: [id number] Patient Account Number: [id number] Date of Birth/Sex: Treating RN: 1940-09-07 (82 y.o. [name] Primary Care [name]: Hillard Danker Other Clinician: Referring Buddie Marston: Treating [name]/Extender: Madelynn Done Weeks in Treatment: [address] Inactive Wound/Skin Impairment [place], [name] ([id number])  [id number] Page 5 of 8 Nursing Diagnoses: Impaired tissue integrity Knowledge deficit related to ulceration/compromised skin integrity Goals: Patient/caregiver will verbalize understanding of skin care regimen Date Initiated: 10/21/2022 Date Inactivated: 11/25/2022 Target Resolution Date: 11/21/2022 Goal Status: Met Ulcer/skin breakdown will have a volume reduction of 30% by week 4 Date Initiated: 10/21/2022 Date Inactivated: 11/25/2022 Target Resolution Date: 11/21/2022 Goal Status: Met Ulcer/skin breakdown will have a volume reduction of 50% by week 8 Date Initiated: 10/21/2022 Date Inactivated: 12/23/2022 Target Resolution Date: 12/22/2022 Goal Status: Met Ulcer/skin breakdown will have a volume reduction of 80% by week 12 Date Initiated: 10/21/2022 Date Inactivated: 01/13/2023 Target Resolution Date: 01/21/2023 Goal Status: Met Ulcer/skin breakdown will heal within 14 weeks Date Initiated: 10/21/2022 Target Resolution Date: 02/04/2023 Goal Status: Active Interventions: Assess patient/caregiver ability to obtain necessary supplies Assess patient/caregiver ability to perform ulcer/skin care regimen  upon admission and as needed Assess ulceration(s) every visit Provide education on ulcer and skin care Treatment Activities: Patient referred to home care : 10/21/2022 Skin care regimen initiated : 10/21/2022 Topical wound management initiated : 10/21/2022 Notes: Electronic Signature(s) Unsigned Entered By: Midge Aver on 01/13/2023 09:45:27 -------------------------------------------------------------------------------- Pain Assessment Details Patient Name: Date of Service: [name], [name] 01/13/2023 12:15 PM Medical Record Number: [id number] Patient Account Number: [id number] Date of Birth/Sex: Treating RN: 1941/03/23 (82 y.o. [name] Primary Care Shontel Santee: Hillard Danker Other Clinician: Referring Tamber Burtch: Treating Luvena Wentling/Extender: Madelynn Done Weeks in Treatment: 12 Active Problems Location of Pain Severity and Description of Pain Patient Has Paino No Site Locations [place], [place] [name] ([id number]) [phone].pdf Page 6 of 8 Pain Management and Medication Current Pain Management: Electronic Signature(s) Unsigned Entered By: Midge Aver on 01/13/2023 09:26:23 -------------------------------------------------------------------------------- Patient/Caregiver Education Details Patient Name: Date of Service: [name] 10/7/2024andnbsp12:15 PM Medical Record Number: [id number] Patient Account Number: [id number] Date of Birth/Gender: Treating RN: Nov 19, 1940 (82 y.o. [name] Primary Care Physician: Hillard Danker Other Clinician: Referring Physician: Treating Physician/Extender: Madelynn Done Weeks in Treatment: 12 Education Assessment Education Provided To: Patient Education Topics Provided Wound Debridement: Handouts: Wound Debridement Methods: Explain/Verbal Responses: State content correctly Wound/Skin Impairment: Handouts: Caring for Your Ulcer Methods: Explain/Verbal Responses: State content correctly Electronic Signature(s) Unsigned Entered By: Midge Aver on 01/13/2023 09:45:48 Signature(s): [place], Jacoby [name] ([id number]) Date(s): [phone].pdf Page 7 of 8 -------------------------------------------------------------------------------- Wound Assessment Details Patient Name: Date of Service: [name], [name] 01/13/2023 12:15 PM Medical Record Number: [id number] Patient Account Number: [id number] Date of Birth/Sex: Treating RN: 1940/09/29 (82 y.o. [name] Primary Care Faylynn Stamos: Hillard Danker Other Clinician: Referring Sher Hellinger: Treating Zimir Kittleson/Extender: Madelynn Done Weeks in Treatment: 12 Wound Status Wound Number: 1 Primary Etiology: Pressure Ulcer Wound Location: Midline Coccyx Wound Status: Open Wounding  Event: Pressure Injury Comorbid History: Hypertension, Received Chemotherapy Date Acquired: 08/30/2022 Weeks Of Treatment: 12 Clustered Wound: No Photos Wound Measurements Length: (cm) 2.6 Width: (cm) 1 Depth: (cm) 0.2 Area: (cm) 2.042 Volume: (cm) 0.408 % Reduction in Area: 95.3% % Reduction in Volume: 99.6% Wound Description Classification: Category/Stage IV Exudate Amount: Medium Exudate Type: Serosanguineous Exudate Color: red, brown Foul Odor After Cleansing: No Slough/Fibrino Yes Wound Bed Granulation Amount: Medium (34-66%) Exposed Structure Granulation Quality: Red, Pink Fascia Exposed: No Necrotic Amount: Medium (34-66%) Fat Layer (Subcutaneous Tissue) Exposed: Yes Tendon Exposed: No Muscle Exposed: No Joint Exposed: No Bone Exposed: Yes Treatment Notes Wound #1 (Coccyx) Wound Laterality: Midline Cleanser Normal Saline Discharge Instruction: Wash your hands with soap and water. Remove old dressing, discard into plastic bag and place into trash. Cleanse the [place], Sherrine [name] ([id number]) [phone].pdf Page 8 of 8 wound with Normal Saline prior to applying a clean dressing using gauze sponges, not tissues or cotton balls. Do not scrub or use excessive force. Pat dry using gauze sponges, not tissue or cotton balls. Peri-Wound Care Topical Primary Dressing Hydrofera Blue Ready Transfer Foam, 2.5x2.5 (in/in) Discharge Instruction: Apply Hydrofera Blue Ready to wound bed as directed Cavilon No Sting Barrier Film Secondary Dressing (BORDER) Zetuvit Plus SILICONE BORDER Dressing 5x5 (in/in) Discharge Instruction: Please do not put silicone bordered dressings under wraps. Use non-bordered dressing only. Secured With Compression Wrap Compression Stockings Add-Ons Electronic Signature(s) Unsigned Entered By: Midge Aver on 01/13/2023 09:28:07 -------------------------------------------------------------------------------- Vitals  Details Patient Name: Date of Service: [name], [name] [name] 01/13/2023 12:15 PM Medical Record Number: [id number] Patient Account Number: [id number]

## 2023-01-15 ENCOUNTER — Ambulatory Visit: Payer: Medicare PPO

## 2023-01-15 ENCOUNTER — Encounter: Payer: Self-pay | Admitting: Internal Medicine

## 2023-01-15 ENCOUNTER — Telehealth: Payer: Self-pay | Admitting: Medical Oncology

## 2023-01-15 ENCOUNTER — Ambulatory Visit: Payer: Medicare PPO | Admitting: Physician Assistant

## 2023-01-15 ENCOUNTER — Other Ambulatory Visit: Payer: Medicare PPO

## 2023-01-15 NOTE — Telephone Encounter (Signed)
[name] is asking why her appts are decoupled. He really wants them on the same day .

## 2023-01-15 NOTE — Telephone Encounter (Signed)
Son's message secure chatted  to infusion , scheduler and provider.

## 2023-01-16 ENCOUNTER — Other Ambulatory Visit: Payer: Self-pay | Admitting: Medical Oncology

## 2023-01-16 ENCOUNTER — Other Ambulatory Visit: Payer: Self-pay

## 2023-01-16 ENCOUNTER — Ambulatory Visit (HOSPITAL_COMMUNITY): Payer: Medicare PPO | Attending: Cardiology

## 2023-01-16 DIAGNOSIS — I340 Nonrheumatic mitral (valve) insufficiency: Secondary | ICD-10-CM | POA: Diagnosis not present

## 2023-01-16 DIAGNOSIS — Q2381 Bicuspid aortic valve: Secondary | ICD-10-CM | POA: Diagnosis not present

## 2023-01-16 DIAGNOSIS — Q230 Congenital stenosis of aortic valve: Secondary | ICD-10-CM | POA: Diagnosis not present

## 2023-01-16 DIAGNOSIS — C9000 Multiple myeloma not having achieved remission: Secondary | ICD-10-CM

## 2023-01-16 LAB — ECHOCARDIOGRAM COMPLETE
AV Mean grad: 15.2 mm[Hg]
AV Peak grad: 29.1 mm[Hg]
Ao pk vel: 2.7 m/s
Area-P 1/2: 2.69 cm2
S' Lateral: 2.5 cm

## 2023-01-16 MED ORDER — LENALIDOMIDE 15 MG PO CAPS
15.0000 mg | ORAL_CAPSULE | Freq: Every day | ORAL | 0 refills | Status: DC
Start: 2023-01-16 — End: 2023-01-21

## 2023-01-16 NOTE — Telephone Encounter (Signed)
Authorization requested for auth number for revlimid.

## 2023-01-20 ENCOUNTER — Encounter: Payer: Medicare PPO | Admitting: Physician Assistant

## 2023-01-20 DIAGNOSIS — C9000 Multiple myeloma not having achieved remission: Secondary | ICD-10-CM | POA: Diagnosis not present

## 2023-01-20 DIAGNOSIS — M6281 Muscle weakness (generalized): Secondary | ICD-10-CM | POA: Diagnosis not present

## 2023-01-20 DIAGNOSIS — L89153 Pressure ulcer of sacral region, stage 3: Secondary | ICD-10-CM | POA: Diagnosis not present

## 2023-01-20 DIAGNOSIS — L89154 Pressure ulcer of sacral region, stage 4: Secondary | ICD-10-CM | POA: Diagnosis not present

## 2023-01-20 NOTE — Progress Notes (Addendum)
and very pleased with where we stand today. I do not see any signs of active infection locally or systemically at this point. Integumentary (Hair, Skin) Wound #1 status is Open. Original cause of wound was Pressure Injury. The date acquired was: 08/30/2022. The wound has been in treatment 13 weeks. [facility] [place], [name] ([id number])  [id number] Page 6 of 7 wound is located on the Midline Coccyx. The wound measures 2cm length x 0.5cm width x 0.2cm depth; 0.785cm^2 area and 0.157cm^3 volume. There is bone and Fat Layer (Subcutaneous Tissue) exposed. There is a medium amount of serosanguineous drainage noted. There is medium (34-66%) red, pink granulation within the wound bed. There is a medium (34-66%) amount of necrotic tissue within the wound bed. Assessment Active Problems ICD-10 Pressure ulcer of sacral region, stage 4 Muscle weakness (generalized) Multiple myeloma not having achieved remission Plan Follow-up Appointments: Return Appointment in 2 weeks. Home Health: DISCONTINUE Home Health for Wound Care. - FAMILY WILL DO DRESSING CHANGES. Bathing/ Shower/ Hygiene: May shower; gently cleanse wound with antibacterial soap, rinse and pat dry prior to dressing wounds No tub bath. Off-Loading: Low air-loss mattress (Group 2) Turn and reposition every 2 hours WOUND #1: - Coccyx Wound Laterality: Midline Cleanser: Normal Saline 3 x Per Week/30 Days Discharge Instructions: Wash your hands with soap and water. Remove old dressing, discard into plastic bag and place into trash. Cleanse the wound with Normal Saline prior to applying a clean dressing using gauze sponges, not tissues or cotton balls. Do not scrub or use excessive force. Pat dry using gauze sponges, not tissue or cotton balls. Prim Dressing: Hydrofera Blue Ready Transfer Foam, 2.5x2.5 (in/in) 3 x Per Week/30 Days ary Discharge Instructions: Apply Hydrofera Blue Ready to wound bed as directed Prim Dressing: Cavilon No Sting Barrier Film 3 x Per Week/30 Days ary Secondary Dressing: (BORDER) Zetuvit Plus SILICONE BORDER Dressing 5x5 (in/in) (Generic) 3 x Per Week/30 Days Discharge Instructions: Please do not put silicone bordered dressings under wraps. Use non-bordered dressing only. 1. Based on what I am seeing I do believe that  we should continue with the [facility] I think this is doing great. 2. I am also can recommend we continue with the bordered foam dressing to cover. 3. I am also going to suggest at this point we continue with regular follow-ups for the patient would like to see about doing a 2-week follow-up since she is doing well I am okay with that for the time being we will going plan to see her in 2 weeks. We will see patient back for reevaluation in 1 week here in the clinic. If anything worsens or changes patient will contact our office for additional recommendations. Electronic Signature(s) Signed: 01/20/2023 1:00:44 PM By: Allen Derry PA-C Entered By: Allen Derry on 01/20/2023 10:00:44 -------------------------------------------------------------------------------- SuperBill Details Patient Name: Date of Service: [name] 01/20/2023 Medical Record Number: [id number] Patient Account Number: [id number] Date of Birth/Sex: Treating RN: [birth year] (82 y.o. [name] Primary Care Provider: Hillard Danker Other Clinician: Referring Provider: Treating Provider/Extender: Madelynn Done Weeks in Treatment: [address] [name] ([id number]) [phone].pdf Page 7 of 7 Diagnosis Coding ICD-10 Codes Code Description L89.154 Pressure ulcer of sacral region, stage 4 M62.81 Muscle weakness (generalized) C90.00 Multiple myeloma not having achieved remission Facility Procedures : 7 CPT4 Code: 7425956 Description: 99213 - WOUND CARE VISIT-LEV 3 EST PT Modifier: Quantity: 1 Physician Procedures : CPT4 Code Description Modifier [id number] 99213 - WC PHYS LEVEL 3 - EST  [name] Rinks PA-C Entered By: Allen Derry on 01/20/2023 09:24:10 -------------------------------------------------------------------------------- Progress Note Details Patient Name: Date of Service: LA, [name] 01/20/2023 12:15 PM Medical Record Number: [id number] Patient Account Number: [id number] Date of Birth/Sex: Treating RN: 1940/06/23 (82 y.o. [name] Primary Care Provider: Hillard Danker Other Clinician: Referring Provider: Treating Provider/Extender: Madelynn Done Weeks in Treatment: 13 Subjective Chief Complaint Information obtained from Patient [place], [place] [name] ([id number]) [id number] Page 5 of 7 Stage 4 pressure ulcer History of Present Illness (HPI) 10-21-2022 upon evaluation today patient appears to be doing pretty well in general in regard to a fairly significant however pressure ulcer in the midline sacral/coccyx region which is definitely stage IV. Currently there is not direct bone exposure because she is now covered with granulation tissue her family has been taking care of her at home at this point although she was previously in a facility. This is something that was noted when she ended up having to go to the ER initially on Aug 30, 2022. Subsequently during hospital stent it was started that she had gentamicin applied topically along with Dakin's moistened gauze packing which has been done at home now and seems to be doing very well. She was at [facility] skilled nursing facility for time. She did have Levaquin previous which she did do very well for her as far as get the infection under control in my opinion I think she is actually seeming to do quite well at this point. She is not having any pain which is also excellent news at this time. She is starting to get around more so she is not completely bedbound although she is a little weak  she does have family that is very attentive and taking great care of her. 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 There is an area of bone that is very thinly covered. Been using Dakin's wet-to-dry dressings. She has in-home caregivers 24/7 and also home health coming out to change dressings/participate in dressing supervision although most of her dressing changes are being done by her own in-home caregivers. 11-04-2022 upon evaluation today patient appears to be doing well currently in regard to her wound. With that being said I do feel like she is making really good progress towards healing currently. I do not see any evidence of active infection locally nor systemically which is excellent news as well. No fevers, chills, nausea, vomiting, or diarrhea. 11-12-2022 patient's wound today appears to be doing okay although to be honest I am a little concerned about the fact that the wound VAC was not put on properly. It was not bridged she was laying on the tubing and this course was not very comfortable for her. Fortunately I do not see any signs of active infection locally or systemically which is great news. No fevers, chills, nausea, vomiting, or diarrhea. 11-18-2022 upon evaluation today patient appears to be doing well currently in regard to her wound. This is actually showing signs of improvement wound VAC was put on much more eloquently this time compared to last I am actually very pleased. He does not like there may be some irritation around the edges of the wound I think this may be subsequent to an issue going on with infection. We can look into trying to determine what this may be and then adjusting medications  and very pleased with where we stand today. I do not see any signs of active infection locally or systemically at this point. Integumentary (Hair, Skin) Wound #1 status is Open. Original cause of wound was Pressure Injury. The date acquired was: 08/30/2022. The wound has been in treatment 13 weeks. [facility] [place], [name] ([id number])  [id number] Page 6 of 7 wound is located on the Midline Coccyx. The wound measures 2cm length x 0.5cm width x 0.2cm depth; 0.785cm^2 area and 0.157cm^3 volume. There is bone and Fat Layer (Subcutaneous Tissue) exposed. There is a medium amount of serosanguineous drainage noted. There is medium (34-66%) red, pink granulation within the wound bed. There is a medium (34-66%) amount of necrotic tissue within the wound bed. Assessment Active Problems ICD-10 Pressure ulcer of sacral region, stage 4 Muscle weakness (generalized) Multiple myeloma not having achieved remission Plan Follow-up Appointments: Return Appointment in 2 weeks. Home Health: DISCONTINUE Home Health for Wound Care. - FAMILY WILL DO DRESSING CHANGES. Bathing/ Shower/ Hygiene: May shower; gently cleanse wound with antibacterial soap, rinse and pat dry prior to dressing wounds No tub bath. Off-Loading: Low air-loss mattress (Group 2) Turn and reposition every 2 hours WOUND #1: - Coccyx Wound Laterality: Midline Cleanser: Normal Saline 3 x Per Week/30 Days Discharge Instructions: Wash your hands with soap and water. Remove old dressing, discard into plastic bag and place into trash. Cleanse the wound with Normal Saline prior to applying a clean dressing using gauze sponges, not tissues or cotton balls. Do not scrub or use excessive force. Pat dry using gauze sponges, not tissue or cotton balls. Prim Dressing: Hydrofera Blue Ready Transfer Foam, 2.5x2.5 (in/in) 3 x Per Week/30 Days ary Discharge Instructions: Apply Hydrofera Blue Ready to wound bed as directed Prim Dressing: Cavilon No Sting Barrier Film 3 x Per Week/30 Days ary Secondary Dressing: (BORDER) Zetuvit Plus SILICONE BORDER Dressing 5x5 (in/in) (Generic) 3 x Per Week/30 Days Discharge Instructions: Please do not put silicone bordered dressings under wraps. Use non-bordered dressing only. 1. Based on what I am seeing I do believe that  we should continue with the [facility] I think this is doing great. 2. I am also can recommend we continue with the bordered foam dressing to cover. 3. I am also going to suggest at this point we continue with regular follow-ups for the patient would like to see about doing a 2-week follow-up since she is doing well I am okay with that for the time being we will going plan to see her in 2 weeks. We will see patient back for reevaluation in 1 week here in the clinic. If anything worsens or changes patient will contact our office for additional recommendations. Electronic Signature(s) Signed: 01/20/2023 1:00:44 PM By: Allen Derry PA-C Entered By: Allen Derry on 01/20/2023 10:00:44 -------------------------------------------------------------------------------- SuperBill Details Patient Name: Date of Service: [name] 01/20/2023 Medical Record Number: [id number] Patient Account Number: [id number] Date of Birth/Sex: Treating RN: [birth year] (82 y.o. [name] Primary Care Provider: Hillard Danker Other Clinician: Referring Provider: Treating Provider/Extender: Madelynn Done Weeks in Treatment: [address] [name] ([id number]) [phone].pdf Page 7 of 7 Diagnosis Coding ICD-10 Codes Code Description L89.154 Pressure ulcer of sacral region, stage 4 M62.81 Muscle weakness (generalized) C90.00 Multiple myeloma not having achieved remission Facility Procedures : 7 CPT4 Code: 7425956 Description: 99213 - WOUND CARE VISIT-LEV 3 EST PT Modifier: Quantity: 1 Physician Procedures : CPT4 Code Description Modifier [id number] 99213 - WC PHYS LEVEL 3 - EST  and very pleased with where we stand today. I do not see any signs of active infection locally or systemically at this point. Integumentary (Hair, Skin) Wound #1 status is Open. Original cause of wound was Pressure Injury. The date acquired was: 08/30/2022. The wound has been in treatment 13 weeks. [facility] [place], [name] ([id number])  [id number] Page 6 of 7 wound is located on the Midline Coccyx. The wound measures 2cm length x 0.5cm width x 0.2cm depth; 0.785cm^2 area and 0.157cm^3 volume. There is bone and Fat Layer (Subcutaneous Tissue) exposed. There is a medium amount of serosanguineous drainage noted. There is medium (34-66%) red, pink granulation within the wound bed. There is a medium (34-66%) amount of necrotic tissue within the wound bed. Assessment Active Problems ICD-10 Pressure ulcer of sacral region, stage 4 Muscle weakness (generalized) Multiple myeloma not having achieved remission Plan Follow-up Appointments: Return Appointment in 2 weeks. Home Health: DISCONTINUE Home Health for Wound Care. - FAMILY WILL DO DRESSING CHANGES. Bathing/ Shower/ Hygiene: May shower; gently cleanse wound with antibacterial soap, rinse and pat dry prior to dressing wounds No tub bath. Off-Loading: Low air-loss mattress (Group 2) Turn and reposition every 2 hours WOUND #1: - Coccyx Wound Laterality: Midline Cleanser: Normal Saline 3 x Per Week/30 Days Discharge Instructions: Wash your hands with soap and water. Remove old dressing, discard into plastic bag and place into trash. Cleanse the wound with Normal Saline prior to applying a clean dressing using gauze sponges, not tissues or cotton balls. Do not scrub or use excessive force. Pat dry using gauze sponges, not tissue or cotton balls. Prim Dressing: Hydrofera Blue Ready Transfer Foam, 2.5x2.5 (in/in) 3 x Per Week/30 Days ary Discharge Instructions: Apply Hydrofera Blue Ready to wound bed as directed Prim Dressing: Cavilon No Sting Barrier Film 3 x Per Week/30 Days ary Secondary Dressing: (BORDER) Zetuvit Plus SILICONE BORDER Dressing 5x5 (in/in) (Generic) 3 x Per Week/30 Days Discharge Instructions: Please do not put silicone bordered dressings under wraps. Use non-bordered dressing only. 1. Based on what I am seeing I do believe that  we should continue with the [facility] I think this is doing great. 2. I am also can recommend we continue with the bordered foam dressing to cover. 3. I am also going to suggest at this point we continue with regular follow-ups for the patient would like to see about doing a 2-week follow-up since she is doing well I am okay with that for the time being we will going plan to see her in 2 weeks. We will see patient back for reevaluation in 1 week here in the clinic. If anything worsens or changes patient will contact our office for additional recommendations. Electronic Signature(s) Signed: 01/20/2023 1:00:44 PM By: Allen Derry PA-C Entered By: Allen Derry on 01/20/2023 10:00:44 -------------------------------------------------------------------------------- SuperBill Details Patient Name: Date of Service: [name] 01/20/2023 Medical Record Number: [id number] Patient Account Number: [id number] Date of Birth/Sex: Treating RN: [birth year] (82 y.o. [name] Primary Care Provider: Hillard Danker Other Clinician: Referring Provider: Treating Provider/Extender: Madelynn Done Weeks in Treatment: [address] [name] ([id number]) [phone].pdf Page 7 of 7 Diagnosis Coding ICD-10 Codes Code Description L89.154 Pressure ulcer of sacral region, stage 4 M62.81 Muscle weakness (generalized) C90.00 Multiple myeloma not having achieved remission Facility Procedures : 7 CPT4 Code: 7425956 Description: 99213 - WOUND CARE VISIT-LEV 3 EST PT Modifier: Quantity: 1 Physician Procedures : CPT4 Code Description Modifier [id number] 99213 - WC PHYS LEVEL 3 - EST  Signed: 01/20/2023 12:59:54 PM By: Allen Derry PA-C Entered By: Allen Derry on 01/20/2023 09:59:54 -------------------------------------------------------------------------------- Physical Exam Details Patient Name: Date of Service: AKITA, MAXIM [name] 01/20/2023 12:15 PM Medical Record Number: [id number] Patient Account Number: [id number] Date of Birth/Sex: Treating RN: 08/03/1940 (82 y.o. [name] Primary  Care Provider: Hillard Danker Other Clinician: Referring Provider: Treating Provider/Extender: Madelynn Done Weeks in Treatment: 13 Constitutional Well-nourished and well-hydrated in no acute distress. Respiratory normal breathing without difficulty. Psychiatric this patient is able to make decisions and demonstrates good insight into disease process. Alert and Oriented x 3. pleasant and cooperative. Notes Upon inspection patient's wound bed actually showed signs of excellent improvement this is measuring smaller and looking better and very pleased with where we stand today. I do not see any signs of active infection locally or systemically at this point. [name], [name] ([id number]) [id number] Page 3 of 7 Electronic Signature(s) Signed: 01/20/2023 1:00:08 PM By: Allen Derry PA-C Entered By: Allen Derry on 01/20/2023 10:00:08 -------------------------------------------------------------------------------- Physician Orders Details Patient Name: Date of Service: SHANETTA, NICOLLS [name] 01/20/2023 12:15 PM Medical Record Number: [id number] Patient Account Number: [id number] Date of Birth/Sex: Treating RN: March 03, 1941 (82 y.o. [name] Primary Care Provider: Hillard Danker Other Clinician: Referring Provider: Treating Provider/Extender: Madelynn Done Weeks in Treatment: 50 Verbal / Phone Orders: No Diagnosis Coding ICD-10 Coding Code Description L89.154 Pressure ulcer of sacral region, stage 4 M62.81 Muscle weakness (generalized) C90.00 Multiple myeloma not having achieved remission Follow-up Appointments Return Appointment in 2 weeks. Home Health DISCONTINUE Home Health for Wound Care. - FAMILY WILL DO DRESSING CHANGES. Bathing/ Shower/ Hygiene May shower; gently cleanse wound with antibacterial soap, rinse and pat dry prior to dressing wounds No tub bath. Off-Loading Low air-loss mattress (Group 2) Turn and reposition every 2  hours Wound Treatment Wound #1 - Coccyx Wound Laterality: Midline Cleanser: Normal Saline 3 x Per Week/30 Days Discharge Instructions: Wash your hands with soap and water. Remove old dressing, discard into plastic bag and place into trash. Cleanse the wound with Normal Saline prior to applying a clean dressing using gauze sponges, not tissues or cotton balls. Do not scrub or use excessive force. Pat dry using gauze sponges, not tissue or cotton balls. Prim Dressing: Hydrofera Blue Ready Transfer Foam, 2.5x2.5 (in/in) 3 x Per Week/30 Days ary Discharge Instructions: Apply Hydrofera Blue Ready to wound bed as directed Prim Dressing: Cavilon No Sting Barrier Film ary 3 x Per Week/30 Days Secondary Dressing: (BORDER) Zetuvit Plus SILICONE BORDER Dressing 5x5 (in/in) (Generic) 3 x Per Week/30 Days Discharge Instructions: Please do not put silicone bordered dressings under wraps. Use non-bordered dressing only. Electronic Signature(s) Signed: 01/20/2023 4:48:00 PM By: Allen Derry PA-C Signed: 01/20/2023 4:53:08 PM By: Midge Aver MSN RN CNS WTA Entered By: Midge Aver on 01/20/2023 09:36:47 [place], [name] ([id number]) [id number] Page 4 of 7 -------------------------------------------------------------------------------- Problem List Details Patient Name: Date of Service: TERREN, HABERLE [name] 01/20/2023 12:15 PM Medical Record Number: [id number] Patient Account Number: [id number] Date of Birth/Sex: Treating RN: [birth year] (82 y.o. [name] Primary Care Provider: Hillard Danker Other Clinician: Referring Provider: Treating Provider/Extender: Madelynn Done Weeks in Treatment: 13 Active Problems ICD-10 Encounter Code Description Active Date MDM Diagnosis L89.154 Pressure ulcer of sacral region, stage 4 10/21/2022 No Yes M62.81 Muscle weakness (generalized) 10/21/2022 No Yes C90.00 Multiple myeloma not having achieved remission 10/21/2022 No Yes Inactive  Problems Resolved Problems Electronic Signature(s) Signed: 01/20/2023 12:24:10 PM By: Larina Bras,  and very pleased with where we stand today. I do not see any signs of active infection locally or systemically at this point. Integumentary (Hair, Skin) Wound #1 status is Open. Original cause of wound was Pressure Injury. The date acquired was: 08/30/2022. The wound has been in treatment 13 weeks. [facility] [place], [name] ([id number])  [id number] Page 6 of 7 wound is located on the Midline Coccyx. The wound measures 2cm length x 0.5cm width x 0.2cm depth; 0.785cm^2 area and 0.157cm^3 volume. There is bone and Fat Layer (Subcutaneous Tissue) exposed. There is a medium amount of serosanguineous drainage noted. There is medium (34-66%) red, pink granulation within the wound bed. There is a medium (34-66%) amount of necrotic tissue within the wound bed. Assessment Active Problems ICD-10 Pressure ulcer of sacral region, stage 4 Muscle weakness (generalized) Multiple myeloma not having achieved remission Plan Follow-up Appointments: Return Appointment in 2 weeks. Home Health: DISCONTINUE Home Health for Wound Care. - FAMILY WILL DO DRESSING CHANGES. Bathing/ Shower/ Hygiene: May shower; gently cleanse wound with antibacterial soap, rinse and pat dry prior to dressing wounds No tub bath. Off-Loading: Low air-loss mattress (Group 2) Turn and reposition every 2 hours WOUND #1: - Coccyx Wound Laterality: Midline Cleanser: Normal Saline 3 x Per Week/30 Days Discharge Instructions: Wash your hands with soap and water. Remove old dressing, discard into plastic bag and place into trash. Cleanse the wound with Normal Saline prior to applying a clean dressing using gauze sponges, not tissues or cotton balls. Do not scrub or use excessive force. Pat dry using gauze sponges, not tissue or cotton balls. Prim Dressing: Hydrofera Blue Ready Transfer Foam, 2.5x2.5 (in/in) 3 x Per Week/30 Days ary Discharge Instructions: Apply Hydrofera Blue Ready to wound bed as directed Prim Dressing: Cavilon No Sting Barrier Film 3 x Per Week/30 Days ary Secondary Dressing: (BORDER) Zetuvit Plus SILICONE BORDER Dressing 5x5 (in/in) (Generic) 3 x Per Week/30 Days Discharge Instructions: Please do not put silicone bordered dressings under wraps. Use non-bordered dressing only. 1. Based on what I am seeing I do believe that  we should continue with the [facility] I think this is doing great. 2. I am also can recommend we continue with the bordered foam dressing to cover. 3. I am also going to suggest at this point we continue with regular follow-ups for the patient would like to see about doing a 2-week follow-up since she is doing well I am okay with that for the time being we will going plan to see her in 2 weeks. We will see patient back for reevaluation in 1 week here in the clinic. If anything worsens or changes patient will contact our office for additional recommendations. Electronic Signature(s) Signed: 01/20/2023 1:00:44 PM By: Allen Derry PA-C Entered By: Allen Derry on 01/20/2023 10:00:44 -------------------------------------------------------------------------------- SuperBill Details Patient Name: Date of Service: [name] 01/20/2023 Medical Record Number: [id number] Patient Account Number: [id number] Date of Birth/Sex: Treating RN: [birth year] (82 y.o. [name] Primary Care Provider: Hillard Danker Other Clinician: Referring Provider: Treating Provider/Extender: Madelynn Done Weeks in Treatment: [address] [name] ([id number]) [phone].pdf Page 7 of 7 Diagnosis Coding ICD-10 Codes Code Description L89.154 Pressure ulcer of sacral region, stage 4 M62.81 Muscle weakness (generalized) C90.00 Multiple myeloma not having achieved remission Facility Procedures : 7 CPT4 Code: 7425956 Description: 99213 - WOUND CARE VISIT-LEV 3 EST PT Modifier: Quantity: 1 Physician Procedures : CPT4 Code Description Modifier [id number] 99213 - WC PHYS LEVEL 3 - EST  LYBRAND, [name] ([id number]) [id number] Page 1 of 7 Visit Report for 01/20/2023 Chief Complaint Document Details Patient Name: Date of Service: [name], [name] [name] 01/20/2023 12:15 PM Medical Record Number: [id number] Patient Account Number: [id number] Date of Birth/Sex: Treating RN: Nov 11, 1940 (82 y.o. [name] Primary Care Provider: Hillard Danker Other Clinician: Referring Provider: Treating Provider/Extender: Madelynn Done Weeks in Treatment: 13 Information Obtained from: Patient Chief Complaint Stage 4 pressure ulcer Electronic Signature(s) Signed: 01/20/2023 12:24:13 PM By: Allen Derry PA-C Entered By: Allen Derry on 01/20/2023 09:24:13 -------------------------------------------------------------------------------- HPI Details Patient Name: Date of Service: [name] 01/20/2023 12:15 PM Medical Record Number: [id number] Patient Account Number: [id number] Date of Birth/Sex: Treating RN: 04/06/1941 (82 y.o. 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 Currently there is not direct bone exposure because she is now covered with granulation tissue her family has been taking care of her at home at this point although she was previously in a facility. This is something that was noted when she ended up having to go to the ER initially on Aug 30, 2022. Subsequently during hospital stent it was started that she had gentamicin applied topically along with Dakin's moistened gauze packing which has been done at home now and seems to be doing very well. She was at [facility] skilled  nursing facility for time. She did have Levaquin previous which she did do very well for her as far as get the infection under control in my opinion I think she is actually seeming to do quite well at this point. She is not having any pain which is also excellent news at this time. She is starting to get around more so she is not completely bedbound although she is a little weak she does have family that is very attentive and taking great care of her. 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 There is an area of bone that is very thinly covered. Been using Dakin's wet-to-dry dressings. She has in-home caregivers 24/7 and also home health coming out to change dressings/participate in dressing supervision although most of her dressing changes are being done by her own in-home caregivers. 11-04-2022 upon evaluation today patient appears to be doing well currently in regard to her wound. With that being said I do feel like she is making really good progress towards healing currently. I do not see any evidence of active infection locally nor systemically which is excellent news as well. No fevers, chills, [name], [name] ([id number]) [phone].pdf Page 2 of 7 nausea, vomiting, or diarrhea. 11-12-2022 patient's wound today appears to be doing okay although to be honest I am a little concerned about the fact that the wound VAC was not put on properly. It was not bridged she was laying on the tubing and this course was not very comfortable for her. Fortunately I do not see any signs of active infection locally or systemically which is great news. No fevers, chills, nausea, vomiting, or diarrhea. 11-18-2022 upon evaluation today patient appears to be doing well

## 2023-01-20 NOTE — Progress Notes (Signed)
[name], [name] ([id number]) [id number] Page 1 of 9 Visit Report for 01/20/2023 Arrival Information Details Patient Name: Date of Service: [name], [name] [name] 01/20/2023 12:15 PM Medical Record Number: [id number] Patient Account Number: [id number] Date of Birth/Sex: Treating RN: Feb 27, 1941 (82 y.o. [name] Primary Care Oleva Koo: Hillard Danker Other Clinician: Referring Kayle Correa: Treating Kyilee Gregg/Extender: Madelynn Done Weeks in Treatment: 13 Visit Information History Since Last Visit Added or deleted any medications: No Patient Arrived: [name] Any new allergies or adverse reactions: No Arrival Time: 12:11 Has Dressing in Place as Prescribed: Yes Accompanied By: self Pain Present Now: No Transfer Assistance: None Patient Identification Verified: Yes Secondary Verification Process Completed: Yes Patient Requires Transmission-Based Precautions: No Patient Has Alerts: Yes Patient Alerts: Not Diabetic Electronic Signature(s) Signed: 01/20/2023 4:53:08 PM By: Midge Aver MSN RN CNS WTA Entered By: Midge Aver on 01/20/2023 09:14:59 -------------------------------------------------------------------------------- Clinic Level of Care Assessment Details Patient Name: Date of Service: [name], [name] 01/20/2023 12:15 PM Medical Record Number: [id number] Patient Account Number: [id number] Date of Birth/Sex: Treating RN: [birth year] (82 y.o. [name] Primary Care Dorthey Depace: Hillard Danker Other Clinician: Referring Nessie Nong: Treating Draedyn Weidinger/Extender: Madelynn Done Weeks in Treatment: 13 Clinic Level of Care Assessment Items TOOL 4 Quantity Score X- 1 0 Use when only an EandM is performed on FOLLOW-UP visit ASSESSMENTS - Nursing Assessment / Reassessment X- 1 10 Reassessment of Co-morbidities (includes updates in patient status) X- 1 5 Reassessment of Adherence to Treatment Plan ASSESSMENTS - Wound and Skin A ssessment /  Reassessment X - Simple Wound Assessment / Reassessment - one wound 1 5 [name], [name] ([id number]) [phone].pdf Page 2 of 9 []  - 0 Complex Wound Assessment / Reassessment - multiple wounds []  - 0 Dermatologic / Skin Assessment (not related to wound area) ASSESSMENTS - Focused Assessment []  - 0 Circumferential Edema Measurements - multi extremities []  - 0 Nutritional Assessment / Counseling / Intervention []  - 0 Lower Extremity Assessment (monofilament, tuning fork, pulses) []  - 0 Peripheral Arterial Disease Assessment (using hand held doppler) ASSESSMENTS - Ostomy and/or Continence Assessment and Care []  - 0 Incontinence Assessment and Management []  - 0 Ostomy Care Assessment and Management (repouching, etc.) PROCESS - Coordination of Care X - Simple Patient / Family Education for ongoing care 1 15 []  - 0 Complex (extensive) Patient / Family Education for ongoing care X- 1 10 Staff obtains Chiropractor, Records, T Results / Process Orders est []  - 0 Staff telephones HHA, Nursing Homes / Clarify orders / etc []  - 0 Routine Transfer to another Facility (non-emergent condition) []  - 0 Routine Hospital Admission (non-emergent condition) []  - 0 New Admissions / Manufacturing engineer / Ordering NPWT Apligraf, etc. , []  - 0 Emergency Hospital Admission (emergent condition) X- 1 10 Simple Discharge Coordination []  - 0 Complex (extensive) Discharge Coordination PROCESS - Special Needs []  - 0 Pediatric / Minor Patient Management []  - 0 Isolation Patient Management []  - 0 Hearing / Language / Visual special needs []  - 0 Assessment of Community assistance (transportation, D/C planning, etc.) []  - 0 Additional assistance / Altered mentation []  - 0 Support Surface(s) Assessment (bed, cushion, seat, etc.) INTERVENTIONS - Wound Cleansing / Measurement X - Simple Wound Cleansing - one wound 1 5 []  - 0 Complex Wound Cleansing - multiple wounds X- 1  5 Wound Imaging (photographs - any number of wounds) []  - 0 Wound Tracing (instead of photographs) X- 1 5 Simple Wound Measurement - one wound []  - 0  [name], [name] ([id number]) [id number] Page 1 of 9 Visit Report for 01/20/2023 Arrival Information Details Patient Name: Date of Service: [name], [name] [name] 01/20/2023 12:15 PM Medical Record Number: [id number] Patient Account Number: [id number] Date of Birth/Sex: Treating RN: Feb 27, 1941 (82 y.o. [name] Primary Care Oleva Koo: Hillard Danker Other Clinician: Referring Kayle Correa: Treating Kyilee Gregg/Extender: Madelynn Done Weeks in Treatment: 13 Visit Information History Since Last Visit Added or deleted any medications: No Patient Arrived: [name] Any new allergies or adverse reactions: No Arrival Time: 12:11 Has Dressing in Place as Prescribed: Yes Accompanied By: self Pain Present Now: No Transfer Assistance: None Patient Identification Verified: Yes Secondary Verification Process Completed: Yes Patient Requires Transmission-Based Precautions: No Patient Has Alerts: Yes Patient Alerts: Not Diabetic Electronic Signature(s) Signed: 01/20/2023 4:53:08 PM By: Midge Aver MSN RN CNS WTA Entered By: Midge Aver on 01/20/2023 09:14:59 -------------------------------------------------------------------------------- Clinic Level of Care Assessment Details Patient Name: Date of Service: [name], [name] 01/20/2023 12:15 PM Medical Record Number: [id number] Patient Account Number: [id number] Date of Birth/Sex: Treating RN: [birth year] (82 y.o. [name] Primary Care Dorthey Depace: Hillard Danker Other Clinician: Referring Nessie Nong: Treating Draedyn Weidinger/Extender: Madelynn Done Weeks in Treatment: 13 Clinic Level of Care Assessment Items TOOL 4 Quantity Score X- 1 0 Use when only an EandM is performed on FOLLOW-UP visit ASSESSMENTS - Nursing Assessment / Reassessment X- 1 10 Reassessment of Co-morbidities (includes updates in patient status) X- 1 5 Reassessment of Adherence to Treatment Plan ASSESSMENTS - Wound and Skin A ssessment /  Reassessment X - Simple Wound Assessment / Reassessment - one wound 1 5 [name], [name] ([id number]) [phone].pdf Page 2 of 9 []  - 0 Complex Wound Assessment / Reassessment - multiple wounds []  - 0 Dermatologic / Skin Assessment (not related to wound area) ASSESSMENTS - Focused Assessment []  - 0 Circumferential Edema Measurements - multi extremities []  - 0 Nutritional Assessment / Counseling / Intervention []  - 0 Lower Extremity Assessment (monofilament, tuning fork, pulses) []  - 0 Peripheral Arterial Disease Assessment (using hand held doppler) ASSESSMENTS - Ostomy and/or Continence Assessment and Care []  - 0 Incontinence Assessment and Management []  - 0 Ostomy Care Assessment and Management (repouching, etc.) PROCESS - Coordination of Care X - Simple Patient / Family Education for ongoing care 1 15 []  - 0 Complex (extensive) Patient / Family Education for ongoing care X- 1 10 Staff obtains Chiropractor, Records, T Results / Process Orders est []  - 0 Staff telephones HHA, Nursing Homes / Clarify orders / etc []  - 0 Routine Transfer to another Facility (non-emergent condition) []  - 0 Routine Hospital Admission (non-emergent condition) []  - 0 New Admissions / Manufacturing engineer / Ordering NPWT Apligraf, etc. , []  - 0 Emergency Hospital Admission (emergent condition) X- 1 10 Simple Discharge Coordination []  - 0 Complex (extensive) Discharge Coordination PROCESS - Special Needs []  - 0 Pediatric / Minor Patient Management []  - 0 Isolation Patient Management []  - 0 Hearing / Language / Visual special needs []  - 0 Assessment of Community assistance (transportation, D/C planning, etc.) []  - 0 Additional assistance / Altered mentation []  - 0 Support Surface(s) Assessment (bed, cushion, seat, etc.) INTERVENTIONS - Wound Cleansing / Measurement X - Simple Wound Cleansing - one wound 1 5 []  - 0 Complex Wound Cleansing - multiple wounds X- 1  5 Wound Imaging (photographs - any number of wounds) []  - 0 Wound Tracing (instead of photographs) X- 1 5 Simple Wound Measurement - one wound []  - 0  08/30/2022 Weeks Of Treatment: 13 Clustered Wound: No Photos Wound Measurements Length: (cm) 2 Width: (cm) 0.5 Depth: (cm) 0.2 Area: (cm) 0.785 Volume: (cm) 0.157 % Reduction in Area: 98.2% % Reduction in Volume: 99.9% Wound Description Classification: Category/Stage IV Exudate Amount: Medium Exudate Type: Serosanguineous Murcia, Cardelia [name] ([id number]) Exudate Color: red, brown Foul Odor After Cleansing: No Slough/Fibrino Yes [id number].pdf Page 8 of  9 Wound Bed Granulation Amount: Medium (34-66%) Exposed Structure Granulation Quality: Red, Pink Fascia Exposed: No Necrotic Amount: Medium (34-66%) Fat Layer (Subcutaneous Tissue) Exposed: Yes Tendon Exposed: No Muscle Exposed: No Joint Exposed: No Bone Exposed: Yes Treatment Notes Wound #1 (Coccyx) Wound Laterality: Midline Cleanser Normal Saline Discharge Instruction: Wash your hands with soap and water. Remove old dressing, discard into plastic bag and place into trash. Cleanse the wound with Normal Saline prior to applying a clean dressing using gauze sponges, not tissues or cotton balls. Do not scrub or use excessive force. Pat dry using gauze sponges, not tissue or cotton balls. Peri-Wound Care Topical Primary Dressing Hydrofera Blue Ready Transfer Foam, 2.5x2.5 (in/in) Discharge Instruction: Apply Hydrofera Blue Ready to wound bed as directed Cavilon No Sting Barrier Film Secondary Dressing (BORDER) Zetuvit Plus SILICONE BORDER Dressing 5x5 (in/in) Discharge Instruction: Please do not put silicone bordered dressings under wraps. Use non-bordered dressing only. Secured With Compression Wrap Compression Stockings Facilities manager) Signed: 01/20/2023 4:53:08 PM By: Midge Aver MSN RN CNS WTA Entered By: Midge Aver on 01/20/2023 09:20:39 -------------------------------------------------------------------------------- Vitals Details Patient Name: Date of Service: [name] 01/20/2023 12:15 PM Medical Record Number: [id number] Patient Account Number: [id number] Date of Birth/Sex: Treating RN: 13-Jul-1940 (82 y.o. [name] Primary Care Iviona Hole: Hillard Danker Other Clinician: Referring Tashauna Caisse: Treating Shauntel Prest/Extender: Madelynn Done Weeks in Treatment: 13 Vital Signs Time Taken: 12:15 Temperature (F): 98.4 Pulse (bpm): 66 Respiratory Rate (breaths/min): 18 Blood Pressure (mmHg): 156/57 Reference Range: 80 - 120 mg /  dl [name], [name] ([id number]) [phone].pdf Page 9 of 9 Electronic Signature(s) Signed: 01/20/2023 4:53:08 PM By: Midge Aver MSN RN CNS WTA Entered By: Midge Aver on 01/20/2023 09:19:03  08/30/2022 Weeks Of Treatment: 13 Clustered Wound: No Photos Wound Measurements Length: (cm) 2 Width: (cm) 0.5 Depth: (cm) 0.2 Area: (cm) 0.785 Volume: (cm) 0.157 % Reduction in Area: 98.2% % Reduction in Volume: 99.9% Wound Description Classification: Category/Stage IV Exudate Amount: Medium Exudate Type: Serosanguineous Murcia, Cardelia [name] ([id number]) Exudate Color: red, brown Foul Odor After Cleansing: No Slough/Fibrino Yes [id number].pdf Page 8 of  9 Wound Bed Granulation Amount: Medium (34-66%) Exposed Structure Granulation Quality: Red, Pink Fascia Exposed: No Necrotic Amount: Medium (34-66%) Fat Layer (Subcutaneous Tissue) Exposed: Yes Tendon Exposed: No Muscle Exposed: No Joint Exposed: No Bone Exposed: Yes Treatment Notes Wound #1 (Coccyx) Wound Laterality: Midline Cleanser Normal Saline Discharge Instruction: Wash your hands with soap and water. Remove old dressing, discard into plastic bag and place into trash. Cleanse the wound with Normal Saline prior to applying a clean dressing using gauze sponges, not tissues or cotton balls. Do not scrub or use excessive force. Pat dry using gauze sponges, not tissue or cotton balls. Peri-Wound Care Topical Primary Dressing Hydrofera Blue Ready Transfer Foam, 2.5x2.5 (in/in) Discharge Instruction: Apply Hydrofera Blue Ready to wound bed as directed Cavilon No Sting Barrier Film Secondary Dressing (BORDER) Zetuvit Plus SILICONE BORDER Dressing 5x5 (in/in) Discharge Instruction: Please do not put silicone bordered dressings under wraps. Use non-bordered dressing only. Secured With Compression Wrap Compression Stockings Facilities manager) Signed: 01/20/2023 4:53:08 PM By: Midge Aver MSN RN CNS WTA Entered By: Midge Aver on 01/20/2023 09:20:39 -------------------------------------------------------------------------------- Vitals Details Patient Name: Date of Service: [name] 01/20/2023 12:15 PM Medical Record Number: [id number] Patient Account Number: [id number] Date of Birth/Sex: Treating RN: 13-Jul-1940 (82 y.o. [name] Primary Care Iviona Hole: Hillard Danker Other Clinician: Referring Tashauna Caisse: Treating Shauntel Prest/Extender: Madelynn Done Weeks in Treatment: 13 Vital Signs Time Taken: 12:15 Temperature (F): 98.4 Pulse (bpm): 66 Respiratory Rate (breaths/min): 18 Blood Pressure (mmHg): 156/57 Reference Range: 80 - 120 mg /  dl [name], [name] ([id number]) [phone].pdf Page 9 of 9 Electronic Signature(s) Signed: 01/20/2023 4:53:08 PM By: Midge Aver MSN RN CNS WTA Entered By: Midge Aver on 01/20/2023 09:19:03

## 2023-01-21 ENCOUNTER — Ambulatory Visit: Payer: Medicare PPO

## 2023-01-21 ENCOUNTER — Inpatient Hospital Stay: Payer: Medicare PPO

## 2023-01-21 ENCOUNTER — Inpatient Hospital Stay: Payer: Medicare PPO | Admitting: Physician Assistant

## 2023-01-21 ENCOUNTER — Other Ambulatory Visit: Payer: Self-pay | Admitting: Internal Medicine

## 2023-01-21 DIAGNOSIS — C9000 Multiple myeloma not having achieved remission: Secondary | ICD-10-CM

## 2023-01-22 ENCOUNTER — Other Ambulatory Visit: Payer: Self-pay | Admitting: Physician Assistant

## 2023-01-22 ENCOUNTER — Inpatient Hospital Stay: Payer: Medicare PPO

## 2023-01-22 ENCOUNTER — Encounter: Payer: Self-pay | Admitting: *Deleted

## 2023-01-22 ENCOUNTER — Inpatient Hospital Stay: Payer: Medicare PPO | Admitting: Internal Medicine

## 2023-01-22 VITALS — BP 162/48 | HR 69 | Temp 97.5°F | Resp 16 | Ht 61.5 in | Wt 148.2 lb

## 2023-01-22 DIAGNOSIS — C9000 Multiple myeloma not having achieved remission: Secondary | ICD-10-CM | POA: Diagnosis not present

## 2023-01-22 DIAGNOSIS — Z7961 Long term (current) use of immunomodulator: Secondary | ICD-10-CM | POA: Diagnosis not present

## 2023-01-22 DIAGNOSIS — E876 Hypokalemia: Secondary | ICD-10-CM

## 2023-01-22 DIAGNOSIS — R109 Unspecified abdominal pain: Secondary | ICD-10-CM | POA: Diagnosis not present

## 2023-01-22 DIAGNOSIS — R197 Diarrhea, unspecified: Secondary | ICD-10-CM | POA: Diagnosis not present

## 2023-01-22 DIAGNOSIS — Z5112 Encounter for antineoplastic immunotherapy: Secondary | ICD-10-CM | POA: Diagnosis not present

## 2023-01-22 DIAGNOSIS — Z7952 Long term (current) use of systemic steroids: Secondary | ICD-10-CM | POA: Diagnosis not present

## 2023-01-22 DIAGNOSIS — Z79899 Other long term (current) drug therapy: Secondary | ICD-10-CM | POA: Diagnosis not present

## 2023-01-22 DIAGNOSIS — Z79624 Long term (current) use of inhibitors of nucleotide synthesis: Secondary | ICD-10-CM | POA: Diagnosis not present

## 2023-01-22 LAB — CMP (CANCER CENTER ONLY)
ALT: 31 U/L (ref 0–44)
AST: 26 U/L (ref 15–41)
Albumin: 3.4 g/dL — ABNORMAL LOW (ref 3.5–5.0)
Alkaline Phosphatase: 134 U/L — ABNORMAL HIGH (ref 38–126)
Anion gap: 11 (ref 5–15)
BUN: 15 mg/dL (ref 8–23)
CO2: 26 mmol/L (ref 22–32)
Calcium: 8.8 mg/dL — ABNORMAL LOW (ref 8.9–10.3)
Chloride: 104 mmol/L (ref 98–111)
Creatinine: 0.81 mg/dL (ref 0.44–1.00)
GFR, Estimated: >60 mL/min (ref >60)
Glucose, Bld: 130 mg/dL — ABNORMAL HIGH (ref 70–99)
Potassium: 3.1 mmol/L — ABNORMAL LOW (ref 3.5–5.1)
Sodium: 141 mmol/L (ref 135–145)
Total Bilirubin: 0.6 mg/dL (ref 0.3–1.2)
Total Protein: 5.6 g/dL — ABNORMAL LOW (ref 6.5–8.1)

## 2023-01-22 LAB — CBC WITH DIFFERENTIAL (CANCER CENTER ONLY)
Abs Immature Granulocytes: 0.04 10*3/uL (ref 0.00–0.07)
Basophils Absolute: 0.1 10*3/uL (ref 0.0–0.1)
Basophils Relative: 1 %
Eosinophils Absolute: 0.3 10*3/uL (ref 0.0–0.5)
Eosinophils Relative: 3 %
HCT: 34.2 % — ABNORMAL LOW (ref 36.0–46.0)
Hemoglobin: 11.2 g/dL — ABNORMAL LOW (ref 12.0–15.0)
Immature Granulocytes: 0 %
Lymphocytes Relative: 18 %
Lymphs Abs: 2 10*3/uL (ref 0.7–4.0)
MCH: 33.6 pg (ref 26.0–34.0)
MCHC: 32.7 g/dL (ref 30.0–36.0)
MCV: 102.7 fL — ABNORMAL HIGH (ref 80.0–100.0)
Monocytes Absolute: 1.4 10*3/uL — ABNORMAL HIGH (ref 0.1–1.0)
Monocytes Relative: 13 %
Neutro Abs: 7.1 10*3/uL (ref 1.7–7.7)
Neutrophils Relative %: 65 %
Platelet Count: 165 10*3/uL (ref 150–400)
RBC: 3.33 MIL/uL — ABNORMAL LOW (ref 3.87–5.11)
RDW: 15 % (ref 11.5–15.5)
WBC Count: 11 10*3/uL — ABNORMAL HIGH (ref 4.0–10.5)
nRBC: 0 % (ref 0.0–0.2)

## 2023-01-22 LAB — LACTATE DEHYDROGENASE: LDH: 150 U/L (ref 98–192)

## 2023-01-22 MED ORDER — DEXAMETHASONE 4 MG PO TABS
20.0000 mg | ORAL_TABLET | Freq: Once | ORAL | Status: AC
  Administered 2023-01-22: 20 mg via ORAL
  Filled 2023-01-22: qty 5

## 2023-01-22 MED ORDER — ZOLEDRONIC ACID 4 MG/100ML IV SOLN
4.0000 mg | Freq: Once | INTRAVENOUS | Status: AC
  Administered 2023-01-22: 4 mg via INTRAVENOUS
  Filled 2023-01-22: qty 100

## 2023-01-22 MED ORDER — POTASSIUM CHLORIDE CRYS ER 20 MEQ PO TBCR
20.0000 meq | EXTENDED_RELEASE_TABLET | Freq: Every day | ORAL | 0 refills | Status: DC
Start: 2023-01-22 — End: 2023-02-04

## 2023-01-22 MED ORDER — DIPHENHYDRAMINE HCL 25 MG PO CAPS
50.0000 mg | ORAL_CAPSULE | Freq: Once | ORAL | Status: AC
  Administered 2023-01-22: 50 mg via ORAL
  Filled 2023-01-22: qty 2

## 2023-01-22 MED ORDER — DARATUMUMAB-HYALURONIDASE-FIHJ 1800-30000 MG-UT/15ML ~~LOC~~ SOLN
1800.0000 mg | Freq: Once | SUBCUTANEOUS | Status: AC
  Administered 2023-01-22: 1800 mg via SUBCUTANEOUS
  Filled 2023-01-22: qty 15

## 2023-01-22 MED ORDER — SODIUM CHLORIDE 0.9 % IV SOLN: Freq: Once | INTRAVENOUS | Status: AC

## 2023-01-22 MED ORDER — ACETAMINOPHEN 325 MG PO TABS
650.0000 mg | ORAL_TABLET | Freq: Once | ORAL | Status: AC
  Administered 2023-01-22: 650 mg via ORAL
  Filled 2023-01-22: qty 2

## 2023-01-22 NOTE — Progress Notes (Signed)
Per Cassie Heilengoetter, PA, OK to administer Zometa today with corrected calcium 9.28.  Patient denies any jaw or dental pain, upcoming or recent dental procedures.  3 minutes after completion of Darzalex Faspro injection, pt c/o some stinging and burning at injection site.  RN noticed erythema around injection site.  Per pt, this has not happened after previous injections.  [name] Shepherd, PA, assessed pt at bedside.  Per PA, OK to discharge pt home.  Patient informed that Cassie, PA, ordered potassium for patient to take at home.  RN instructed pt on taking medicine, including how to dissolve in water if unable to take large pills.  Patient and patient's son verbalized understanding.

## 2023-01-22 NOTE — Progress Notes (Signed)
[facility] [phone]   [phone]  OFFICE PROGRESS NOTE  [name], MD 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 Dorris Fetch on January 28, 2022.    CURRENT THERAPY:  1) Starting systemic therapy with Revlimid 21 days on 7 days off, Darzalex, and 20 mg of Decadron weekly.  First dose expected next week on 09/11/2022.  Status post 4 cycles.  2) Monthly Zometa injections for 6 to 12 months, followed by every 3 months thereon after once dental clearance is obtained.  INTERVAL HISTORY: [name] 82 y.o. female returns to the clinic today for follow-up visit accompanied by her son..Discussed the use of AI scribe software for clinical note transcription with the patient, who gave verbal consent to proceed.  History of Present Illness   The patient, an 81 year old female with a diagnosis of multiple myeloma in April 2024, is currently undergoing chemotherapy treatment with three drugs: subcutaneous daratumumab, Revlimid, and Decadron. She is in the middle of the fifth cycle of chemotherapy.  The patient reports a decline in her overall health status since the last visit, primarily experiencing fatigue and low energy levels. She describes her condition as feeling like she can "hardly go."  In addition to the chemotherapy, the patient started a new medication, Colestipol, prescribed by a gastroenterologist last week. This was initiated due to ongoing issues with intermittent, uncontrollable diarrhea. The effectiveness of this new medication is currently unclear due to the inconsistency of the diarrhea  episodes. The patient reports having a bowel movement once a day, or less frequently.  The patient also reports abdominal discomfort, specifically soreness on the lower right side. The cause and severity of this discomfort are not further elaborated upon in the conversation.        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 by echo 10/2021   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/2015   Carotid artery stenosis    < 50% stenosis bilaterally by doppler 07/2016   Coronary artery disease 2008   Coronary Ca score of 331 with minimal multivessel plaque < 25% stenosis by coronary CTA 8/23   Heart murmur    per pt   Hypercholesteremia    Hypertension    Obesity    Osteopenia    declines treatment   Pneumonia 1995   Pulmonary HTN (HCC)    mild to moderate by echo 7/23 with PASP   Shoulder pain    Due to arthritis   Vitamin D insufficiency     ALLERGIES:  is allergic to crestor [rosuvastatin calcium], lactose, lipitor [atorvastatin], pravastatin, simvastatin, tramadol, zetia [ezetimibe], codeine, penicillins, sulfa antibiotics, and vancomycin.  MEDICATIONS:  Current Outpatient Medications  Medication Sig Dispense Refill   acetaminophen (TYLENOL) 325 MG tablet Take 2 tablets (650 mg total) by mouth every 8 (  eight) hours as needed.     acyclovir (ZOVIRAX) 200 MG capsule Take 1 capsule (200 mg total) by mouth 2 (two) times daily. 30 capsule 2   allopurinol (ZYLOPRIM) 100 MG tablet Take 1 tablet (100 mg total) by mouth 2 (two) times daily. 60 tablet 2   ascorbic acid (VITAMIN C) 500 MG tablet Take 1 tablet (500 mg total) by mouth daily.     daratumumab-hyaluronidase-fihj (DARZALEX FASPRO) 1800-30000 MG-UT/15ML SOLN Inject 1,800 mg into  the skin once.     dexamethasone (DECADRON) 4 MG tablet Please take 5 tablets (20 mg) weekly on the days of treatment 40 tablet 3   HYDROcodone-acetaminophen (NORCO/VICODIN) 5-325 MG tablet Take 1 tablet by mouth every 6 (six) hours as needed for moderate pain.     lenalidomide (REVLIMID) 15 MG capsule TAKE 1 (15MG ) CAPSULE BY MOUTH DAILY 21 capsule 0   Multiple Vitamin (MULTIVITAMIN WITH MINERALS) TABS tablet Take 1 tablet by mouth daily.     naloxone (NARCAN) nasal spray 4 mg/0.1 mL      omeprazole (PRILOSEC) 20 MG capsule Take 1 capsule (20 mg total) by mouth daily. 30 capsule 3   oxycodone (OXY-IR) 5 MG capsule Take 5 mg by mouth every 6 (six) hours as needed for pain.     sodium hypochlorite (DAKIN'S 1/4 STRENGTH) 0.125 % SOLN 1 application Externally Once a day for 30 days     No current facility-administered medications for this visit.    SURGICAL HISTORY:  Past Surgical History:  Procedure Laterality Date   AORTIC VALVE REPLACEMENT N/A 10/14/2013   Procedure: AORTIC VALVE REPLACEMENT (AVR);  Surgeon: Alleen Borne, MD;  Location: [facility] OR;  Service: Open Heart Surgery;  Laterality: N/A;   CARDIAC CATHETERIZATION     CATARACT EXTRACTION, BILATERAL     CHOLECYSTECTOMY  04/08/1988   ESOPHAGOGASTRODUODENOSCOPY (EGD) WITH PROPOFOL N/A 09/05/2022   Procedure: ESOPHAGOGASTRODUODENOSCOPY (EGD) WITH PROPOFOL;  Surgeon: Kathi Der, MD;  Location: [facility] ENDOSCOPY;  Service: Gastroenterology;  Laterality: N/A;   INTRAOPERATIVE TRANSESOPHAGEAL ECHOCARDIOGRAM N/A 10/14/2013   Procedure: INTRAOPERATIVE TRANSESOPHAGEAL ECHOCARDIOGRAM;  Surgeon: Alleen Borne, MD;  Location: [facility] OR;  Service: Open Heart Surgery;  Laterality: N/A;   IVC FILTER INSERTION N/A 09/06/2022   Procedure: IVC FILTER INSERTION;  Surgeon: Leonie Douglas, MD;  Location: [facility] INVASIVE CV LAB;  Service: Cardiovascular;  Laterality: N/A;   KYPHOPLASTY Bilateral 03/27/2022   Procedure: KYPHOPLASTY AND BIOPSY THORACIC ELEVEN;   Surgeon: Lisbeth Renshaw, MD;  Location: [facility] OR;  Service: Neurosurgery;  Laterality: Bilateral;   LEFT AND RIGHT HEART CATHETERIZATION WITH CORONARY ANGIOGRAM N/A 09/16/2013   Procedure: LEFT AND RIGHT HEART CATHETERIZATION WITH CORONARY ANGIOGRAM;  Surgeon: Quintella Reichert, MD;  Location: [facility] CATH LAB;  Service: Cardiovascular;  Laterality: N/A;   TONSILLECTOMY      REVIEW OF SYSTEMS:  A comprehensive review of systems was negative except for: Constitutional: positive for fatigue Gastrointestinal: positive for abdominal pain Musculoskeletal: positive for arthralgias   PHYSICAL EXAMINATION: General appearance: alert, cooperative, fatigued, and no distress Head: Normocephalic, without obvious abnormality, atraumatic Neck: no adenopathy, no JVD, supple, symmetrical, trachea midline, and thyroid not enlarged, symmetric, no tenderness/mass/nodules Lymph nodes: Cervical, supraclavicular, and axillary nodes normal. Resp: clear to auscultation bilaterally Back: symmetric, no curvature. ROM normal. No CVA tenderness. Cardio: regular rate and rhythm, S1, S2 normal, no murmur, click, rub or gallop GI: soft, non-tender; bowel sounds normal; no masses,  no organomegaly Extremities: extremities normal, atraumatic, no cyanosis or edema  ECOG PERFORMANCE  STATUS: 1 - Symptomatic but completely ambulatory  Blood pressure (!) 162/48, pulse 69, temperature (!) 97.5 F (36.4 C), temperature source Oral, resp. rate 16, height 5' 1.5" (1.562 m), weight 148 lb 3.2 oz (67.2 kg), SpO2 97%.  LABORATORY DATA: Lab Results  Component Value Date   WBC 11.0 (H) 01/22/2023   HGB 11.2 (L) 01/22/2023   HCT 34.2 (L) 01/22/2023   MCV 102.7 (H) 01/22/2023   PLT 165 01/22/2023      Chemistry      Component Value Date/Time   NA 141 01/08/2023 1452   NA 141 10/06/2020 0939   K 3.8 01/08/2023 1452   CL 105 01/08/2023 1452   CO2 31 01/08/2023 1452   BUN 20 01/08/2023 1452   BUN 17 10/06/2020 0939   CREATININE  0.71 01/08/2023 1452   CREATININE 0.85 01/29/2016 0910      Component Value Date/Time   CALCIUM 9.0 01/08/2023 1452   ALKPHOS 156 (H) 01/08/2023 1452   AST 17 01/08/2023 1452   ALT 16 01/08/2023 1452   BILITOT 0.3 01/08/2023 1452       RADIOGRAPHIC STUDIES: ECHOCARDIOGRAM COMPLETE  Result Date: 01/16/2023    ECHOCARDIOGRAM REPORT   Patient Name:   [name]  Date of Exam: 01/16/2023 Medical Rec #:  [id number]     Height:       61.5 in Accession #:    [id number]    Weight:       148.8 lb Date of Birth:  1940/08/12     BSA:          1.676 m Patient Age:    82 years      BP:           127/57 mmHg Patient Gender: F             HR:           71 bpm. Exam Location:  [address] Procedure: 2D Echo, Cardiac Doppler and Color Doppler Indications:    I35.0 Aortic Stenosis  History:        Patient has prior history of Echocardiogram examinations, most                 recent 10/10/2021. CAD, AVR-2015, Signs/Symptoms:Murmur; Risk                 Factors:Hypertension and Dyslipidemia. Pulmonary hypertension.                 Pneumonia.                 Aortic Valve: 23 mm Edwards Magna-Ease pericardial valve valve                 is present in the aortic position. Procedure Date: 10/14/2013.  Sonographer:    Daphine Deutscher RDCS Referring Phys: [id number] Evern Bio WEAVER IMPRESSIONS  1. Left ventricular ejection fraction, by estimation, is 60 to 65%. The left ventricle has normal function. The left ventricle has no regional wall motion abnormalities. Left ventricular diastolic parameters are consistent with Grade I diastolic dysfunction (impaired relaxation).  2. Right ventricular systolic function is normal. The right ventricular size is normal. There is normal pulmonary artery systolic pressure. The estimated right ventricular systolic pressure is 26.4 mmHg.  3. Left atrial size was moderately dilated.  4. The mitral valve is normal in structure. Mild mitral valve regurgitation. No evidence of mitral stenosis.   5. The aortic valve has been repaired/replaced. Aortic valve regurgitation is  not visualized. No aortic stenosis is present. There is a 23 mm Edwards Magna-Ease pericardial valve valve present in the aortic position. Procedure Date: 10/14/2013. Aortic valve mean gradient measures 15.2 mmHg. Aortic valve Vmax measures 2.70 m/s.  6. Aortic dilatation noted. There is mild dilatation of the ascending aorta, measuring 40 mm.  7. The inferior vena cava is normal in size with greater than 50% respiratory variability, suggesting right atrial pressure of 3 mmHg. Comparison(s): No significant change from prior study. Prior images reviewed side by side. Prior AVR mean gradient 17 mmHg. FINDINGS  Left Ventricle: Left ventricular ejection fraction, by estimation, is 60 to 65%. The left ventricle has normal function. The left ventricle has no regional wall motion abnormalities. The left ventricular internal cavity size was normal in size. There is  no left ventricular hypertrophy. Left ventricular diastolic parameters are consistent with Grade I diastolic dysfunction (impaired relaxation). Right Ventricle: The right ventricular size is normal. No increase in right ventricular wall thickness. Right ventricular systolic function is normal. There is normal pulmonary artery systolic pressure. The tricuspid regurgitant velocity is 2.42 m/s, and  with an assumed right atrial pressure of 3 mmHg, the estimated right ventricular systolic pressure is 26.4 mmHg. Left Atrium: Left atrial size was moderately dilated. Right Atrium: Right atrial size was normal in size. Pericardium: There is no evidence of pericardial effusion. Mitral Valve: The mitral valve is normal in structure. Mild mitral valve regurgitation. No evidence of mitral valve stenosis. Tricuspid Valve: The tricuspid valve is normal in structure. Tricuspid valve regurgitation is mild . No evidence of tricuspid stenosis. The aortic valve has been repaired/replaced. Aortic valve  regurgitation is not visualized. No aortic stenosis is present. There is a 23 mm Edwards Magna-Ease pericardial valve valve present in the aortic position. Procedure Date: 10/14/2013. Pulmonic Valve: The pulmonic valve was normal in structure. Pulmonic valve regurgitation is not visualized. No evidence of pulmonic stenosis. Aorta: Aortic dilatation noted. There is mild dilatation of the ascending aorta, measuring 40 mm. Venous: The inferior vena cava is normal in size with greater than 50% respiratory variability, suggesting right atrial pressure of 3 mmHg. IAS/Shunts: No atrial level shunt detected by color flow Doppler.  LEFT VENTRICLE PLAX 2D LVIDd:         4.80 cm Diastology LVIDs:         2.50 cm LV e' medial:    5.50 cm/s LV PW:         0.90 cm LV E/e' medial:  12.3 LV IVS:        0.90 cm LV e' lateral:   8.34 cm/s                        LV E/e' lateral: 8.1  RIGHT VENTRICLE             IVC RV Basal diam:  3.80 cm     IVC diam: 1.30 cm RV S prime:     16.50 cm/s TAPSE (M-mode): 1.5 cm LEFT ATRIUM             Index        RIGHT ATRIUM           Index LA diam:        5.10 cm 3.04 cm/m   RA Area:     11.10 cm LA Vol (A2C):   55.6 ml 33.18 ml/m  RA Volume:   26.60 ml  15.87 ml/m LA Vol (A4C):  23.5 ml 14.02 ml/m LA Biplane Vol: 37.7 ml 22.50 ml/m  AORTIC VALVE AV Vmax:           269.60 cm/s AV Vmean:          179.600 cm/s AV VTI:            0.508 m AV Peak Grad:      29.1 mmHg AV Mean Grad:      15.2 mmHg LVOT Vmax:         135.00 cm/s LVOT Vmean:        90.700 cm/s LVOT VTI:          0.302 m LVOT/AV VTI ratio: 0.59  AORTA Ao Root diam: 3.50 cm Ao Asc diam:  4.00 cm MITRAL VALVE                TRICUSPID VALVE MV Area (PHT): 2.69 cm     TR Peak grad:   23.4 mmHg MV Decel Time: 282 msec     TR Vmax:        242.00 cm/s MV E velocity: 67.75 cm/s MV A velocity: 124.00 cm/s  SHUNTS MV E/A ratio:  0.55         Systemic VTI: 0.30 m Donato Schultz MD Electronically signed by Donato Schultz MD Signature Date/Time:  01/16/2023/2:36:24 PM    Final     ASSESSMENT AND PLAN: This is a very pleasant 82 years old white female with  1) history of stage I (T1a, N0, M0) thymoma type AB diagnosed in October 2023 status post resection under the care of Dr. Dorris Fetch on January 28, 2022 with close resection margin and microscopic infiltration of the capsule. 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  The patient is undergoing systemic therapy with daratumumab, Revlimid 15 mg p.o. daily for 21 days every 4 weeks in addition to Decadron 20 mg weekly with the treatment.  Status post 4 cycles.  She is here today for day 15 of cycle #5. Assessment and Plan    Multiple Myeloma Currently in the middle of cycle 5 of chemotherapy with subcutaneous daratumumab, Revlimid, and Decadron. Reports fatigue and low energy. Hemoglobin improved to 11.2. -Continue current chemotherapy regimen. -Plan to complete cycle 6 and then reassess based on blood work results. Consider treatment break if results are favorable.  Diarrhea Intermittent, uncontrollable diarrhea. Recently started on Colestipol by gastroenterologist. Frequency of bowel movements is once daily or less. -Continue Colestipol as prescribed by gastroenterologist. -Monitor for improvement in diarrhea symptoms.  Abdominal Pain Reports soreness on the lower right side of the abdomen. -Continue to monitor for changes or worsening of abdominal pain.  Follow-up in 2 weeks.   She was advised to call immediately if she has any other concerning symptoms in the interval. The patient voices understanding of current disease status and treatment options and is in agreement with the current care plan.  All questions were answered. The patient knows to call the clinic with any problems, questions or concerns. We can certainly see the patient much sooner if necessary.  The total time spent in the appointment was 20 minutes.  Disclaimer: This note was dictated  with voice recognition software. Similar sounding words can inadvertently be transcribed and may not be corrected upon review.

## 2023-01-22 NOTE — Patient Instructions (Addendum)
[facility]  Discharge Instructions: Thank you for choosing [facility] to provide your oncology and hematology care.   If you have a lab appointment with the Cancer Center, please go directly to the Cancer Center and check in at the registration area.   Wear comfortable clothing and clothing appropriate for easy access to any Portacath or PICC line.   We strive to give you quality time with your provider. You may need to reschedule your appointment if you arrive late (15 or more minutes).  Arriving late affects you and other patients whose appointments are after yours.  Also, if you miss three or more appointments without notifying the office, you may be dismissed from the clinic at the provider's discretion.      For prescription refill requests, have your pharmacy contact our office and allow 72 hours for refills to be completed.    Today you received the following chemotherapy and/or immunotherapy agents Darzalex Faspro      To help prevent nausea and vomiting after your treatment, we encourage you to take your nausea medication as directed.  BELOW ARE SYMPTOMS THAT SHOULD BE REPORTED IMMEDIATELY: *FEVER GREATER THAN 100.4 F (38 C) OR HIGHER *CHILLS OR SWEATING *NAUSEA AND VOMITING THAT IS NOT CONTROLLED WITH YOUR NAUSEA MEDICATION *UNUSUAL SHORTNESS OF BREATH *UNUSUAL BRUISING OR BLEEDING *URINARY PROBLEMS (pain or burning when urinating, or frequent urination) *BOWEL PROBLEMS (unusual diarrhea, constipation, pain near the anus) TENDERNESS IN MOUTH AND THROAT WITH OR WITHOUT PRESENCE OF ULCERS (sore throat, sores in mouth, or a toothache) UNUSUAL RASH, SWELLING OR PAIN  UNUSUAL VAGINAL DISCHARGE OR ITCHING   Items with * indicate a potential emergency and should be followed up as soon as possible or go to the Emergency Department if any problems should occur.  Please show the CHEMOTHERAPY ALERT CARD or IMMUNOTHERAPY ALERT CARD at  check-in to the Emergency Department and triage nurse.  Should you have questions after your visit or need to cancel or reschedule your appointment, please contact [facility]  Dept: [phone]  and follow the prompts.  Office hours are 8:00 a.m. to 4:30 p.m. Monday - Friday. Please note that voicemails left after 4:00 p.m. may not be returned until the following business day.  We are closed weekends and major holidays. You have access to a nurse at all times for urgent questions. Please call the main number to the clinic Dept: [phone] and follow the prompts.   For any non-urgent questions, you may also contact your provider using MyChart. We now offer e-Visits for anyone 22 and older to request care online for non-urgent symptoms. For details visit [email or web address].   Also download the MyChart app! Go to the app store, search "MyChart", open the app, select , and log in with your MyChart username and password.  Zoledronic Acid Injection (Cancer) What is this medication? ZOLEDRONIC ACID (ZOE le dron ik AS id) treats high calcium levels in the blood caused by cancer. It may also be used with chemotherapy to treat weakened bones caused by cancer. It works by slowing down the release of calcium from bones. This lowers calcium levels in your blood. It also makes your bones stronger and less likely to break (fracture). It belongs to a group of medications called bisphosphonates. This medicine may be used for other purposes; ask your health care provider or pharmacist if you have questions. COMMON BRAND NAME(S): Zometa, Zometa Powder What  should I tell my care team before I take this medication? They need to know if you have any of these conditions: Dehydration Dental disease Kidney disease Liver disease Low levels of calcium in the blood Lung or breathing disease, such as asthma Receiving steroids, such as dexamethasone or prednisone An  unusual or allergic reaction to zoledronic acid, other medications, foods, dyes, or preservatives Pregnant or trying to get pregnant Breast-feeding How should I use this medication? This medication is injected into a vein. It is given by your care team in a hospital or clinic setting. Talk to your care team about the use of this medication in children. Special care may be needed. Overdosage: If you think you have taken too much of this medicine contact a poison control center or emergency room at once. NOTE: This medicine is only for you. Do not share this medicine with others. What if I miss a dose? Keep appointments for follow-up doses. It is important not to miss your dose. Call your care team if you are unable to keep an appointment. What may interact with this medication? Certain antibiotics given by injection Diuretics, such as bumetanide, furosemide NSAIDs, medications for pain and inflammation, such as ibuprofen or naproxen Teriparatide Thalidomide This list may not describe all possible interactions. Give your health care provider a list of all the medicines, herbs, non-prescription drugs, or dietary supplements you use. Also tell them if you smoke, drink alcohol, or use illegal drugs. Some items may interact with your medicine. What should I watch for while using this medication? Visit your care team for regular checks on your progress. It may be some time before you see the benefit from this medication. Some people who take this medication have severe bone, joint, or muscle pain. This medication may also increase your risk for jaw problems or a broken thigh bone. Tell your care team right away if you have severe pain in your jaw, bones, joints, or muscles. Tell you care team if you have any pain that does not go away or that gets worse. Tell your dentist and dental surgeon that you are taking this medication. You should not have major dental surgery while on this medication. See your  dentist to have a dental exam and fix any dental problems before starting this medication. Take good care of your teeth while on this medication. Make sure you see your dentist for regular follow-up appointments. You should make sure you get enough calcium and vitamin D while you are taking this medication. Discuss the foods you eat and the vitamins you take with your care team. Check with your care team if you have severe diarrhea, nausea, and vomiting, or if you sweat a lot. The loss of too much body fluid may make it dangerous for you to take this medication. You may need bloodwork while taking this medication. Talk to your care team if you wish to become pregnant or think you might be pregnant. This medication can cause serious birth defects. What side effects may I notice from receiving this medication? Side effects that you should report to your care team as soon as possible: Allergic reactions--skin rash, itching, hives, swelling of the face, lips, tongue, or throat Kidney injury--decrease in the amount of urine, swelling of the ankles, hands, or feet Low calcium level--muscle pain or cramps, confusion, tingling, or numbness in the hands or feet Osteonecrosis of the jaw--pain, swelling, or redness in the mouth, numbness of the jaw, poor healing after dental work, unusual  discharge from the mouth, visible bones in the mouth Severe bone, joint, or muscle pain Side effects that usually do not require medical attention (report to your care team if they continue or are bothersome): Constipation Fatigue Fever Loss of appetite Nausea Stomach pain This list may not describe all possible side effects. Call your doctor for medical advice about side effects. You may report side effects to FDA at 1-800-FDA-1088. Where should I keep my medication? This medication is given in a hospital or clinic. It will not be stored at home. NOTE: This sheet is a summary. It may not cover all possible information.  If you have questions about this medicine, talk to your doctor, pharmacist, or health care provider.  2024 Elsevier/Gold Standard (2021-05-18 00:00:00)

## 2023-01-27 ENCOUNTER — Encounter: Payer: Medicare PPO | Admitting: Physician Assistant

## 2023-01-29 ENCOUNTER — Other Ambulatory Visit: Payer: Medicare PPO

## 2023-01-29 ENCOUNTER — Ambulatory Visit: Payer: Medicare PPO

## 2023-01-29 ENCOUNTER — Ambulatory Visit: Payer: Medicare PPO | Admitting: Physician Assistant

## 2023-01-29 ENCOUNTER — Other Ambulatory Visit: Payer: Self-pay | Admitting: Internal Medicine

## 2023-01-29 DIAGNOSIS — C9000 Multiple myeloma not having achieved remission: Secondary | ICD-10-CM

## 2023-01-30 ENCOUNTER — Other Ambulatory Visit: Payer: Self-pay | Admitting: Medical Oncology

## 2023-01-30 ENCOUNTER — Encounter: Payer: Self-pay | Admitting: Internal Medicine

## 2023-01-30 ENCOUNTER — Encounter: Payer: Self-pay | Admitting: Physician Assistant

## 2023-01-30 NOTE — Telephone Encounter (Signed)
Refill acyclovir requested . Completed.

## 2023-02-03 ENCOUNTER — Encounter: Payer: Medicare PPO | Admitting: Physician Assistant

## 2023-02-03 DIAGNOSIS — L89153 Pressure ulcer of sacral region, stage 3: Secondary | ICD-10-CM | POA: Diagnosis not present

## 2023-02-03 DIAGNOSIS — M6281 Muscle weakness (generalized): Secondary | ICD-10-CM | POA: Diagnosis not present

## 2023-02-03 DIAGNOSIS — C9000 Multiple myeloma not having achieved remission: Secondary | ICD-10-CM | POA: Diagnosis not present

## 2023-02-03 DIAGNOSIS — L89154 Pressure ulcer of sacral region, stage 4: Secondary | ICD-10-CM | POA: Diagnosis not present

## 2023-02-03 NOTE — Progress Notes (Addendum)
Zetuvit to follow. I would recommend a small piece put in and then subsequently a larger piece to cover. 2. I am good recommend as well that we continue with the bordered foam dressing to cover. 3. And also can recommend that we have the patient continue with appropriate offloading she does tell me that she mainly is sleeping on her side. I did reinforce to her that she needs to make sure to keep the dressing in place at all times other than when she is showering this should be covered 100% of the time. We will see patient back for reevaluation in 1 week here in the clinic. If anything worsens or changes patient will  contact our office for additional recommendations. Electronic Signature(s) Signed: 02/03/2023 12:59:02 PM By: [name] Derry PA-C Entered By: [name] on 02/03/2023 09:59:02 [name], [name] ([id number]) [id number] Page 7 of 7 -------------------------------------------------------------------------------- SuperBill Details Patient Name: Date of Service: [name], [name] 02/03/2023 Medical Record Number: [id number] Patient Account Number: [id number] Date of Birth/Sex: Treating RN: 14-Feb-1941 (82 y.o. 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.00 Multiple myeloma not having achieved remission Facility Procedures : 7 CPT4 Code: 0272536 Description: 99213 - WOUND CARE VISIT-LEV 3 EST PT Modifier: Quantity: 1 Physician Procedures : CPT4 Code Description Modifier [id number] 99213 - WC PHYS LEVEL 3 - EST PT ICD-10 Diagnosis Description L89.154 Pressure ulcer of sacral region, stage 4 M62.81 Muscle weakness (generalized) 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 [name] Primary Care Provider: Hillard Danker Other Clinician: Referring Provider: Treating Provider/Extender: [name] Weeks in Treatment: 15 Information Obtained from: Patient Chief Complaint Stage 4 pressure ulcer Electronic Signature(s) Signed: 02/03/2023 12:33:55 PM By: [name] Derry PA-C Entered By: [name] on 02/03/2023 09:33:55 -------------------------------------------------------------------------------- HPI Details Patient Name: Date of Service: [name], [name] [name] 02/03/2023 12:15 PM Medical Record Number: [id number] Patient Account Number: [id number] Date of Birth/Sex: Treating RN: Aug 21, 1940 (82 y.o. 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 Currently there is not direct bone exposure because she is now covered with granulation tissue her family has been taking care of her at home at this point although she was previously in a facility. This is something that was noted when she ended up having to go to the ER initially on Aug 30, 2022. Subsequently during hospital stent it was started that she had gentamicin applied topically along with Dakin's moistened gauze packing which has been [name] at home now and seems to be doing very well. She was at [facility] skilled  nursing facility for time. She did have Levaquin previous which she did do very well for her as far as get the infection under control in my opinion I think she is actually seeming to do quite well at this point. She is not having any pain which is also excellent news at this time. She is starting to get around more so she is not completely bedbound although she is a little weak she does have family that is very attentive and taking great care of her. 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 There is an area of bone that is very thinly covered. Been using Dakin's wet-to-dry dressings. She has in-home caregivers 24/7 and also home health coming out to change dressings/participate in dressing supervision although most of her dressing changes are being [name] by her own in-home caregivers. 11-04-2022 upon evaluation today patient appears to be doing well currently in regard to her wound. With that being said I do feel like she is making really good progress towards healing currently. I do not see any evidence of active infection locally nor systemically which is excellent news as well. No fevers, chills, Lich, Donell [name] ([id number]) [phone].pdf Page 2 of 7 nausea, vomiting, or diarrhea. 11-12-2022 patient's wound today appears to be doing okay although to be honest I am a little concerned about the fact that the wound VAC was not put on properly. It was not bridged she was laying on the tubing and this course was not very comfortable for her. Fortunately I do not see any signs of active infection locally or systemically which is great news. No fevers, chills, nausea, vomiting, or diarrhea. 11-18-2022 upon evaluation today patient appears to be doing well  [name], [name] ([id number]) [id number] Page 1 of 7 Visit Report for 02/03/2023 Chief Complaint Document Details Patient Name: Date of Service: [name], [name] 02/03/2023 12:15 PM Medical Record Number: [id number] Patient Account Number: [id number] Date of Birth/Sex: Treating RN: [birth year] (82 y.o. [name] Primary Care Provider: Hillard Danker Other Clinician: Referring Provider: Treating Provider/Extender: [name] Weeks in Treatment: 15 Information Obtained from: Patient Chief Complaint Stage 4 pressure ulcer Electronic Signature(s) Signed: 02/03/2023 12:33:55 PM By: [name] Derry PA-C Entered By: [name] on 02/03/2023 09:33:55 -------------------------------------------------------------------------------- HPI Details Patient Name: Date of Service: [name], [name] [name] 02/03/2023 12:15 PM Medical Record Number: [id number] Patient Account Number: [id number] Date of Birth/Sex: Treating RN: Aug 21, 1940 (82 y.o. 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 Currently there is not direct bone exposure because she is now covered with granulation tissue her family has been taking care of her at home at this point although she was previously in a facility. This is something that was noted when she ended up having to go to the ER initially on Aug 30, 2022. Subsequently during hospital stent it was started that she had gentamicin applied topically along with Dakin's moistened gauze packing which has been [name] at home now and seems to be doing very well. She was at [facility] skilled  nursing facility for time. She did have Levaquin previous which she did do very well for her as far as get the infection under control in my opinion I think she is actually seeming to do quite well at this point. She is not having any pain which is also excellent news at this time. She is starting to get around more so she is not completely bedbound although she is a little weak she does have family that is very attentive and taking great care of her. 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 There is an area of bone that is very thinly covered. Been using Dakin's wet-to-dry dressings. She has in-home caregivers 24/7 and also home health coming out to change dressings/participate in dressing supervision although most of her dressing changes are being [name] by her own in-home caregivers. 11-04-2022 upon evaluation today patient appears to be doing well currently in regard to her wound. With that being said I do feel like she is making really good progress towards healing currently. I do not see any evidence of active infection locally nor systemically which is excellent news as well. No fevers, chills, Lich, Donell [name] ([id number]) [phone].pdf Page 2 of 7 nausea, vomiting, or diarrhea. 11-12-2022 patient's wound today appears to be doing okay although to be honest I am a little concerned about the fact that the wound VAC was not put on properly. It was not bridged she was laying on the tubing and this course was not very comfortable for her. Fortunately I do not see any signs of active infection locally or systemically which is great news. No fevers, chills, nausea, vomiting, or diarrhea. 11-18-2022 upon evaluation today patient appears to be doing well  Zetuvit to follow. I would recommend a small piece put in and then subsequently a larger piece to cover. 2. I am good recommend as well that we continue with the bordered foam dressing to cover. 3. And also can recommend that we have the patient continue with appropriate offloading she does tell me that she mainly is sleeping on her side. I did reinforce to her that she needs to make sure to keep the dressing in place at all times other than when she is showering this should be covered 100% of the time. We will see patient back for reevaluation in 1 week here in the clinic. If anything worsens or changes patient will  contact our office for additional recommendations. Electronic Signature(s) Signed: 02/03/2023 12:59:02 PM By: [name] Derry PA-C Entered By: [name] on 02/03/2023 09:59:02 [name], [name] ([id number]) [id number] Page 7 of 7 -------------------------------------------------------------------------------- SuperBill Details Patient Name: Date of Service: [name], [name] 02/03/2023 Medical Record Number: [id number] Patient Account Number: [id number] Date of Birth/Sex: Treating RN: 14-Feb-1941 (82 y.o. 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.00 Multiple myeloma not having achieved remission Facility Procedures : 7 CPT4 Code: 0272536 Description: 99213 - WOUND CARE VISIT-LEV 3 EST PT Modifier: Quantity: 1 Physician Procedures : CPT4 Code Description Modifier [id number] 99213 - WC PHYS LEVEL 3 - EST PT ICD-10 Diagnosis Description L89.154 Pressure ulcer of sacral region, stage 4 M62.81 Muscle weakness (generalized) C90.00 Multiple myeloma not having achieved remission Quantity: 1 Electronic Signature(s) Signed: 02/03/2023 1:04:06 PM By: [name] Derry PA-C Entered By: [name] on 02/03/2023 10:04:05  evaluation today patient's wound actually appears to be doing about the same as last time I saw her. Upon discussion with the family it was apparent that the patient may not be keeping the dressing on all the time. On the days when the aide is there she does make sure that is in place but on other days it is uncertain whether or not this is actually being kept in place and it sounds like the answer may be no. Nonetheless I  think that in general she seems to be making some headway here towards closure. Electronic Signature(s) Signed: 02/03/2023 12:57:29 PM By: [name] Derry PA-C Entered By: [name] on 02/03/2023 09:57:28 -------------------------------------------------------------------------------- Physical Exam Details Patient Name: Date of Service: [name], [name] [name] 02/03/2023 12:15 PM Medical Record Number: [id number] Patient Account Number: [id number] Date of Birth/Sex: Treating RN: [birth year] (82 y.o. [name] Primary Care Provider: Hillard Danker Other Clinician: Referring Provider: Treating Provider/Extender: [name] Weeks in Treatment: 15 Constitutional Well-nourished and well-hydrated in no acute distress. Respiratory normal breathing without difficulty. Psychiatric this patient is able to make decisions and demonstrates good insight into disease process. Alert and Oriented x 3. pleasant and cooperative. ERDOS, Deshawn [name] ([id number]) [id number] Page 3 of 7 Notes On inspection patient's wound bed actually showed signs of good granulation and epithelization at this point. Fortunately I do not see any signs of worsening there was minimal slough and biofilm I was able to wipe this off there was nothing residual remaining following that. Electronic Signature(s) Signed: 02/03/2023 12:58:03 PM By: [name] Derry PA-C Entered By: [name] on 02/03/2023 09:58:03 -------------------------------------------------------------------------------- Physician Orders Details Patient Name: Date of Service: SYNAI, MAGNER [name] 02/03/2023 12:15 PM Medical Record Number: [id number] Patient Account Number: [id number] Date of Birth/Sex: Treating RN: 29-Mar-1941 (82 y.o. [name] Primary Care Provider: Hillard Danker Other Clinician: Referring Provider: Treating Provider/Extender: [name] Weeks in Treatment: 15 The following information was scribed by:  Midge Aver The information was scribed for: [name] Verbal / Phone Orders: No Diagnosis Coding ICD-10 Coding Code Description L89.154 Pressure ulcer of sacral region, stage 4 M62.81 Muscle weakness (generalized) C90.00 Multiple myeloma not having achieved remission Follow-up Appointments Return Appointment in 2 weeks. Home Health DISCONTINUE Home Health for Wound Care. - FAMILY WILL DO DRESSING CHANGES. Bathing/ Shower/ Hygiene May shower; gently cleanse wound with antibacterial soap, rinse and pat dry prior to dressing wounds No tub bath. Off-Loading Low air-loss mattress (Group 2) Turn and reposition every 2 hours Wound Treatment Wound #1 - Coccyx Wound Laterality: Midline Cleanser: Normal Saline 3 x Per Week/30 Days Discharge Instructions: Wash your hands with soap and water. Remove old dressing, discard into plastic bag and place into trash. Cleanse the wound with Normal Saline prior to applying a clean dressing using gauze sponges, not tissues or cotton balls. Do not scrub or use excessive force. Pat dry using gauze sponges, not tissue or cotton balls. Prim Dressing: Hydrofera Blue Ready Transfer Foam, 2.5x2.5 (in/in) 3 x Per Week/30 Days ary Discharge Instructions: Apply Hydrofera Blue Ready to wound bed as directed Prim Dressing: Cavilon No Sting Barrier Film ary 3 x Per Week/30 Days Secondary Dressing: (BORDER) Zetuvit Plus SILICONE BORDER Dressing 5x5 (in/in) (DME) (Generic) 3 x Per Week/30 Days Discharge Instructions: Please do not put silicone bordered dressings under wraps. Use non-bordered dressing only. Electronic Signature(s) Signed: 02/03/2023 2:19:13 PM By: Farrel Demark, Ara [name] ([id number]) [id number] Page 4 of 7 Signed: 02/03/2023 4:36:05 PM  [name], [name] ([id number]) [id number] Page 1 of 7 Visit Report for 02/03/2023 Chief Complaint Document Details Patient Name: Date of Service: [name], [name] 02/03/2023 12:15 PM Medical Record Number: [id number] Patient Account Number: [id number] Date of Birth/Sex: Treating RN: [birth year] (82 y.o. [name] Primary Care Provider: Hillard Danker Other Clinician: Referring Provider: Treating Provider/Extender: [name] Weeks in Treatment: 15 Information Obtained from: Patient Chief Complaint Stage 4 pressure ulcer Electronic Signature(s) Signed: 02/03/2023 12:33:55 PM By: [name] Derry PA-C Entered By: [name] on 02/03/2023 09:33:55 -------------------------------------------------------------------------------- HPI Details Patient Name: Date of Service: [name], [name] [name] 02/03/2023 12:15 PM Medical Record Number: [id number] Patient Account Number: [id number] Date of Birth/Sex: Treating RN: Aug 21, 1940 (82 y.o. 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 Currently there is not direct bone exposure because she is now covered with granulation tissue her family has been taking care of her at home at this point although she was previously in a facility. This is something that was noted when she ended up having to go to the ER initially on Aug 30, 2022. Subsequently during hospital stent it was started that she had gentamicin applied topically along with Dakin's moistened gauze packing which has been [name] at home now and seems to be doing very well. She was at [facility] skilled  nursing facility for time. She did have Levaquin previous which she did do very well for her as far as get the infection under control in my opinion I think she is actually seeming to do quite well at this point. She is not having any pain which is also excellent news at this time. She is starting to get around more so she is not completely bedbound although she is a little weak she does have family that is very attentive and taking great care of her. 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 There is an area of bone that is very thinly covered. Been using Dakin's wet-to-dry dressings. She has in-home caregivers 24/7 and also home health coming out to change dressings/participate in dressing supervision although most of her dressing changes are being [name] by her own in-home caregivers. 11-04-2022 upon evaluation today patient appears to be doing well currently in regard to her wound. With that being said I do feel like she is making really good progress towards healing currently. I do not see any evidence of active infection locally nor systemically which is excellent news as well. No fevers, chills, Lich, Donell [name] ([id number]) [phone].pdf Page 2 of 7 nausea, vomiting, or diarrhea. 11-12-2022 patient's wound today appears to be doing okay although to be honest I am a little concerned about the fact that the wound VAC was not put on properly. It was not bridged she was laying on the tubing and this course was not very comfortable for her. Fortunately I do not see any signs of active infection locally or systemically which is great news. No fevers, chills, nausea, vomiting, or diarrhea. 11-18-2022 upon evaluation today patient appears to be doing well  [name], [name] ([id number]) [id number] Page 1 of 7 Visit Report for 02/03/2023 Chief Complaint Document Details Patient Name: Date of Service: [name], [name] 02/03/2023 12:15 PM Medical Record Number: [id number] Patient Account Number: [id number] Date of Birth/Sex: Treating RN: [birth year] (82 y.o. [name] Primary Care Provider: Hillard Danker Other Clinician: Referring Provider: Treating Provider/Extender: [name] Weeks in Treatment: 15 Information Obtained from: Patient Chief Complaint Stage 4 pressure ulcer Electronic Signature(s) Signed: 02/03/2023 12:33:55 PM By: [name] Derry PA-C Entered By: [name] on 02/03/2023 09:33:55 -------------------------------------------------------------------------------- HPI Details Patient Name: Date of Service: [name], [name] [name] 02/03/2023 12:15 PM Medical Record Number: [id number] Patient Account Number: [id number] Date of Birth/Sex: Treating RN: Aug 21, 1940 (82 y.o. 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 Currently there is not direct bone exposure because she is now covered with granulation tissue her family has been taking care of her at home at this point although she was previously in a facility. This is something that was noted when she ended up having to go to the ER initially on Aug 30, 2022. Subsequently during hospital stent it was started that she had gentamicin applied topically along with Dakin's moistened gauze packing which has been [name] at home now and seems to be doing very well. She was at [facility] skilled  nursing facility for time. She did have Levaquin previous which she did do very well for her as far as get the infection under control in my opinion I think she is actually seeming to do quite well at this point. She is not having any pain which is also excellent news at this time. She is starting to get around more so she is not completely bedbound although she is a little weak she does have family that is very attentive and taking great care of her. 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 There is an area of bone that is very thinly covered. Been using Dakin's wet-to-dry dressings. She has in-home caregivers 24/7 and also home health coming out to change dressings/participate in dressing supervision although most of her dressing changes are being [name] by her own in-home caregivers. 11-04-2022 upon evaluation today patient appears to be doing well currently in regard to her wound. With that being said I do feel like she is making really good progress towards healing currently. I do not see any evidence of active infection locally nor systemically which is excellent news as well. No fevers, chills, Lich, Donell [name] ([id number]) [phone].pdf Page 2 of 7 nausea, vomiting, or diarrhea. 11-12-2022 patient's wound today appears to be doing okay although to be honest I am a little concerned about the fact that the wound VAC was not put on properly. It was not bridged she was laying on the tubing and this course was not very comfortable for her. Fortunately I do not see any signs of active infection locally or systemically which is great news. No fevers, chills, nausea, vomiting, or diarrhea. 11-18-2022 upon evaluation today patient appears to be doing well

## 2023-02-03 NOTE — Progress Notes (Signed)
02/03/2023 4:09:56 PM By: Angelina Pih Signed: 02/03/2023 4:36:05 PM By: Midge Aver MSN RN CNS WTA Entered By: Midge Aver on 02/03/2023 09:38:40 -------------------------------------------------------------------------------- Patient/Caregiver Education Details Patient Name: Date of Service: [name] 10/28/2024andnbsp12:15 PM Medical Record Number: [id number] Patient Account Number: [id number] Date of Birth/Gender: Treating RN: 1940-11-27 (82 y.o. [name] Primary Care Physician: Hillard Danker Other Clinician: Referring Physician: Treating Physician/Extender: Madelynn Done Weeks in Treatment: 15 Education Assessment Education Provided To: Patient Education Topics Provided Wound/Skin  Impairment: Handouts: Caring for Your Ulcer Methods: Explain/Verbal Responses: State content correctly Electronic Signature(s) Signed: 02/03/2023 4:36:05 PM By: Midge Aver MSN RN CNS WTA Entered By: Midge Aver on 02/03/2023 09:45:00 -------------------------------------------------------------------------------- Wound Assessment Details Patient Name: Date of Service: [name], [name] [name] 02/03/2023 12:15 PM Medical Record Number: [id number] Patient Account Number: [id number] Date of Birth/Sex: Treating RN: 09-01-1940 (82 y.o. 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 Wound Status: Open [name], [name] ([id number]) [id number] Page 8 of 9 Wounding Event: Pressure Injury Comorbid History: Hypertension, Received Chemotherapy Date Acquired: 08/30/2022 Weeks Of Treatment: 15 Clustered Wound: No Photos Wound Measurements Length: (cm) 2.5 Width: (cm) 0.5 Depth: (cm) 0.2 Area: (cm) 0.982 Volume: (cm) 0.196 % Reduction in Area: 97.7% % Reduction in Volume: 99.8% Epithelialization: None Tunneling: No Undermining: No Wound Description Classification: Category/Stage IV Exudate Amount: Medium Exudate Type: Serosanguineous Exudate Color: red, brown Foul Odor After Cleansing: No Slough/Fibrino Yes Wound Bed Granulation Amount: Medium (34-66%) Exposed Structure Granulation Quality: Red, Pink Fascia Exposed: No Necrotic Amount: Medium (34-66%) Fat Layer (Subcutaneous Tissue) Exposed: Yes Tendon Exposed: No Muscle Exposed: No Joint Exposed: No Bone Exposed: Yes Treatment Notes Wound #1 (Coccyx) Wound Laterality: Midline Cleanser Normal Saline Discharge Instruction: Wash your hands with soap and water. Remove old dressing, discard into  plastic bag and place into trash. Cleanse the wound with Normal Saline prior to applying a clean dressing using gauze sponges, not tissues or cotton balls. Do not scrub or use excessive force. Pat dry using gauze sponges, not tissue or cotton balls. Peri-Wound Care Topical Primary Dressing Hydrofera Blue Ready Transfer Foam, 2.5x2.5 (in/in) Discharge Instruction: Apply Hydrofera Blue Ready to wound bed as directed Cavilon No Sting Barrier Film Secondary Dressing (BORDER) Zetuvit Plus SILICONE BORDER Dressing 5x5 (in/in) Discharge Instruction: Please do not put silicone bordered dressings under wraps. Use non-bordered dressing only. Secured With Compression Wrap Compression Stockings Add-Ons Electronic Signature(s) Signed: 02/03/2023 4:09:56 PM By: Angelina Pih Entered By: Angelina Pih on 02/03/2023 23:53:61 Verrill, Kafi [name] ([id number]) [id number] Page 9 of 9 -------------------------------------------------------------------------------- Vitals Details Patient Name: Date of Service: [name], [name] 02/03/2023 12:15 PM Medical Record Number: [id number] Patient Account Number: [id number] Date of Birth/Sex: Treating RN: [birth year] (82 y.o. Esmeralda Links Primary Care Willi Borowiak: Hillard Danker Other Clinician: Referring Joncarlos Atkison: Treating Kenyada Dosch/Extender: Madelynn Done Weeks in Treatment: 15 Vital Signs Time Taken: 12:20 Temperature (F): 98.3 Pulse (bpm): 63 Respiratory Rate (breaths/min): 18 Blood Pressure (mmHg): 171/69 Reference Range: 80 - 120 mg / dl Electronic Signature(s) Signed: 02/03/2023 4:36:05 PM By: Midge Aver MSN RN CNS WTA Previous Signature: 02/03/2023 12:29:37 PM Version By: Angelina Pih Entered By: Midge Aver on 02/03/2023 09:38:34  [name], [name] ([id number]) [id number] Page 1 of 9 Visit Report for 02/03/2023 Arrival Information Details Patient Name: Date of Service: [name], [name] 02/03/2023 12:15 PM Medical Record Number: [id number] Patient Account Number: [id number] Date of Birth/Sex: Treating RN: March 27, 1941 (82 y.o. Esmeralda Links Primary Care Wyatt Galvan: Hillard Danker Other Clinician: Referring Delma Villalva: Treating Wojciech Willetts/Extender: Madelynn Done Weeks in Treatment: 15 Visit Information History Since Last Visit Added or deleted any medications: No Patient Arrived: [name] Any new allergies or adverse reactions: No Arrival Time: 12:28 Had a fall or experienced change in No Accompanied By: family activities of daily living that may affect Transfer Assistance: None risk of falls: Patient Identification Verified: Yes Hospitalized since last visit: No Secondary Verification Process Completed: Yes Has Dressing in Place as Prescribed: Yes Patient Requires Transmission-Based Precautions: No Pain Present Now: Yes Patient Has Alerts: Yes Patient Alerts: Not Diabetic Electronic Signature(s) Signed: 02/03/2023 1:13:41 PM By: Midge Aver MSN RN CNS WTA Previous Signature: 02/03/2023 12:29:15 PM Version By: Angelina Pih Entered By: Midge Aver on 02/03/2023 10:13:41 -------------------------------------------------------------------------------- Clinic Level of Care Assessment Details Patient Name: Date of Service: [name], [name] [name] 02/03/2023 12:15 PM Medical Record Number: [id number] Patient Account Number: [id number] Date of Birth/Sex: Treating RN: 21-Jul-1940 (82 y.o. [name] Primary Care Gabreal Worton: Hillard Danker Other Clinician: Referring Meria Crilly: Treating Janelis Stelzer/Extender: Madelynn Done Weeks in Treatment: 15 Clinic Level of Care Assessment Items TOOL 4 Quantity Score X- 1 0 Use when only an EandM is performed on FOLLOW-UP visit ASSESSMENTS -  Nursing Assessment / Reassessment X- 1 10 Reassessment of Co-morbidities (includes updates in patient status) X- 1 5 Reassessment of Adherence to Treatment Plan ASSESSMENTS - Wound and Skin A ssessment / Reassessment X - Simple Wound Assessment / Reassessment - one wound 1 5 [name], [name] ([id number]) [phone].pdf Page 2 of 9 []  - 0 Complex Wound Assessment / Reassessment - multiple wounds []  - 0 Dermatologic / Skin Assessment (not related to wound area) ASSESSMENTS - Focused Assessment []  - 0 Circumferential Edema Measurements - multi extremities []  - 0 Nutritional Assessment / Counseling / Intervention []  - 0 Lower Extremity Assessment (monofilament, tuning fork, pulses) []  - 0 Peripheral Arterial Disease Assessment (using hand held doppler) ASSESSMENTS - Ostomy and/or Continence Assessment and Care []  - 0 Incontinence Assessment and Management []  - 0 Ostomy Care Assessment and Management (repouching, etc.) PROCESS - Coordination of Care X - Simple Patient / Family Education for ongoing care 1 15 []  - 0 Complex (extensive) Patient / Family Education for ongoing care X- 1 10 Staff obtains Chiropractor, Records, T Results / Process Orders est []  - 0 Staff telephones HHA, Nursing Homes / Clarify orders / etc []  - 0 Routine Transfer to another Facility (non-emergent condition) []  - 0 Routine Hospital Admission (non-emergent condition) []  - 0 New Admissions / Manufacturing engineer / Ordering NPWT Apligraf, etc. , []  - 0 Emergency Hospital Admission (emergent condition) X- 1 10 Simple Discharge Coordination []  - 0 Complex (extensive) Discharge Coordination PROCESS - Special Needs []  - 0 Pediatric / Minor Patient Management []  - 0 Isolation Patient Management []  - 0 Hearing / Language / Visual special needs []  - 0 Assessment of Community assistance (transportation, D/C planning, etc.) []  - 0 Additional assistance / Altered mentation []  -  0 Support Surface(s) Assessment (bed, cushion, seat, etc.) INTERVENTIONS - Wound Cleansing / Measurement X - Simple Wound Cleansing - one wound 1 5 []  - 0 Complex Wound Cleansing - multiple  02/03/2023 4:09:56 PM By: Angelina Pih Signed: 02/03/2023 4:36:05 PM By: Midge Aver MSN RN CNS WTA Entered By: Midge Aver on 02/03/2023 09:38:40 -------------------------------------------------------------------------------- Patient/Caregiver Education Details Patient Name: Date of Service: [name] 10/28/2024andnbsp12:15 PM Medical Record Number: [id number] Patient Account Number: [id number] Date of Birth/Gender: Treating RN: 1940-11-27 (82 y.o. [name] Primary Care Physician: Hillard Danker Other Clinician: Referring Physician: Treating Physician/Extender: Madelynn Done Weeks in Treatment: 15 Education Assessment Education Provided To: Patient Education Topics Provided Wound/Skin  Impairment: Handouts: Caring for Your Ulcer Methods: Explain/Verbal Responses: State content correctly Electronic Signature(s) Signed: 02/03/2023 4:36:05 PM By: Midge Aver MSN RN CNS WTA Entered By: Midge Aver on 02/03/2023 09:45:00 -------------------------------------------------------------------------------- Wound Assessment Details Patient Name: Date of Service: [name], [name] [name] 02/03/2023 12:15 PM Medical Record Number: [id number] Patient Account Number: [id number] Date of Birth/Sex: Treating RN: 09-01-1940 (82 y.o. 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 Wound Status: Open [name], [name] ([id number]) [id number].pdf Page 8 of 9 Wounding Event: Pressure Injury Comorbid History: Hypertension, Received Chemotherapy Date Acquired: 08/30/2022 Weeks Of Treatment: 15 Clustered Wound: No Photos Wound Measurements Length: (cm) 2.5 Width: (cm) 0.5 Depth: (cm) 0.2 Area: (cm) 0.982 Volume: (cm) 0.196 % Reduction in Area: 97.7% % Reduction in Volume: 99.8% Epithelialization: None Tunneling: No Undermining: No Wound Description Classification: Category/Stage IV Exudate Amount: Medium Exudate Type: Serosanguineous Exudate Color: red, brown Foul Odor After Cleansing: No Slough/Fibrino Yes Wound Bed Granulation Amount: Medium (34-66%) Exposed Structure Granulation Quality: Red, Pink Fascia Exposed: No Necrotic Amount: Medium (34-66%) Fat Layer (Subcutaneous Tissue) Exposed: Yes Tendon Exposed: No Muscle Exposed: No Joint Exposed: No Bone Exposed: Yes Treatment Notes Wound #1 (Coccyx) Wound Laterality: Midline Cleanser Normal Saline Discharge Instruction: Wash your hands with soap and water. Remove old dressing, discard into  plastic bag and place into trash. Cleanse the wound with Normal Saline prior to applying a clean dressing using gauze sponges, not tissues or cotton balls. Do not scrub or use excessive force. Pat dry using gauze sponges, not tissue or cotton balls. Peri-Wound Care Topical Primary Dressing Hydrofera Blue Ready Transfer Foam, 2.5x2.5 (in/in) Discharge Instruction: Apply Hydrofera Blue Ready to wound bed as directed Cavilon No Sting Barrier Film Secondary Dressing (BORDER) Zetuvit Plus SILICONE BORDER Dressing 5x5 (in/in) Discharge Instruction: Please do not put silicone bordered dressings under wraps. Use non-bordered dressing only. Secured With Compression Wrap Compression Stockings Add-Ons Electronic Signature(s) Signed: 02/03/2023 4:09:56 PM By: Angelina Pih Entered By: Angelina Pih on 02/03/2023 23:53:61 Verrill, Kafi [name] ([id number]) [id number] Page 9 of 9 -------------------------------------------------------------------------------- Vitals Details Patient Name: Date of Service: [name], [name] 02/03/2023 12:15 PM Medical Record Number: [id number] Patient Account Number: [id number] Date of Birth/Sex: Treating RN: [birth year] (82 y.o. Esmeralda Links Primary Care Willi Borowiak: Hillard Danker Other Clinician: Referring Joncarlos Atkison: Treating Kenyada Dosch/Extender: Madelynn Done Weeks in Treatment: 15 Vital Signs Time Taken: 12:20 Temperature (F): 98.3 Pulse (bpm): 63 Respiratory Rate (breaths/min): 18 Blood Pressure (mmHg): 171/69 Reference Range: 80 - 120 mg / dl Electronic Signature(s) Signed: 02/03/2023 4:36:05 PM By: Midge Aver MSN RN CNS WTA Previous Signature: 02/03/2023 12:29:37 PM Version By: Angelina Pih Entered By: Midge Aver on 02/03/2023 09:38:34  [name], [name] ([id number]) [id number] Page 1 of 9 Visit Report for 02/03/2023 Arrival Information Details Patient Name: Date of Service: [name], [name] 02/03/2023 12:15 PM Medical Record Number: [id number] Patient Account Number: [id number] Date of Birth/Sex: Treating RN: March 27, 1941 (82 y.o. Esmeralda Links Primary Care Wyatt Galvan: Hillard Danker Other Clinician: Referring Delma Villalva: Treating Wojciech Willetts/Extender: Madelynn Done Weeks in Treatment: 15 Visit Information History Since Last Visit Added or deleted any medications: No Patient Arrived: [name] Any new allergies or adverse reactions: No Arrival Time: 12:28 Had a fall or experienced change in No Accompanied By: family activities of daily living that may affect Transfer Assistance: None risk of falls: Patient Identification Verified: Yes Hospitalized since last visit: No Secondary Verification Process Completed: Yes Has Dressing in Place as Prescribed: Yes Patient Requires Transmission-Based Precautions: No Pain Present Now: Yes Patient Has Alerts: Yes Patient Alerts: Not Diabetic Electronic Signature(s) Signed: 02/03/2023 1:13:41 PM By: Midge Aver MSN RN CNS WTA Previous Signature: 02/03/2023 12:29:15 PM Version By: Angelina Pih Entered By: Midge Aver on 02/03/2023 10:13:41 -------------------------------------------------------------------------------- Clinic Level of Care Assessment Details Patient Name: Date of Service: [name], [name] [name] 02/03/2023 12:15 PM Medical Record Number: [id number] Patient Account Number: [id number] Date of Birth/Sex: Treating RN: 21-Jul-1940 (82 y.o. [name] Primary Care Gabreal Worton: Hillard Danker Other Clinician: Referring Meria Crilly: Treating Janelis Stelzer/Extender: Madelynn Done Weeks in Treatment: 15 Clinic Level of Care Assessment Items TOOL 4 Quantity Score X- 1 0 Use when only an EandM is performed on FOLLOW-UP visit ASSESSMENTS -  Nursing Assessment / Reassessment X- 1 10 Reassessment of Co-morbidities (includes updates in patient status) X- 1 5 Reassessment of Adherence to Treatment Plan ASSESSMENTS - Wound and Skin A ssessment / Reassessment X - Simple Wound Assessment / Reassessment - one wound 1 5 [name], [name] ([id number]) [phone].pdf Page 2 of 9 []  - 0 Complex Wound Assessment / Reassessment - multiple wounds []  - 0 Dermatologic / Skin Assessment (not related to wound area) ASSESSMENTS - Focused Assessment []  - 0 Circumferential Edema Measurements - multi extremities []  - 0 Nutritional Assessment / Counseling / Intervention []  - 0 Lower Extremity Assessment (monofilament, tuning fork, pulses) []  - 0 Peripheral Arterial Disease Assessment (using hand held doppler) ASSESSMENTS - Ostomy and/or Continence Assessment and Care []  - 0 Incontinence Assessment and Management []  - 0 Ostomy Care Assessment and Management (repouching, etc.) PROCESS - Coordination of Care X - Simple Patient / Family Education for ongoing care 1 15 []  - 0 Complex (extensive) Patient / Family Education for ongoing care X- 1 10 Staff obtains Chiropractor, Records, T Results / Process Orders est []  - 0 Staff telephones HHA, Nursing Homes / Clarify orders / etc []  - 0 Routine Transfer to another Facility (non-emergent condition) []  - 0 Routine Hospital Admission (non-emergent condition) []  - 0 New Admissions / Manufacturing engineer / Ordering NPWT Apligraf, etc. , []  - 0 Emergency Hospital Admission (emergent condition) X- 1 10 Simple Discharge Coordination []  - 0 Complex (extensive) Discharge Coordination PROCESS - Special Needs []  - 0 Pediatric / Minor Patient Management []  - 0 Isolation Patient Management []  - 0 Hearing / Language / Visual special needs []  - 0 Assessment of Community assistance (transportation, D/C planning, etc.) []  - 0 Additional assistance / Altered mentation []  -  0 Support Surface(s) Assessment (bed, cushion, seat, etc.) INTERVENTIONS - Wound Cleansing / Measurement X - Simple Wound Cleansing - one wound 1 5 []  - 0 Complex Wound Cleansing - multiple

## 2023-02-04 ENCOUNTER — Inpatient Hospital Stay: Payer: Medicare PPO | Admitting: Internal Medicine

## 2023-02-04 ENCOUNTER — Other Ambulatory Visit: Payer: Self-pay | Admitting: Medical Oncology

## 2023-02-04 ENCOUNTER — Inpatient Hospital Stay: Payer: Medicare PPO

## 2023-02-04 VITALS — BP 159/56

## 2023-02-04 DIAGNOSIS — C9000 Multiple myeloma not having achieved remission: Secondary | ICD-10-CM | POA: Diagnosis not present

## 2023-02-04 DIAGNOSIS — Z79624 Long term (current) use of inhibitors of nucleotide synthesis: Secondary | ICD-10-CM | POA: Diagnosis not present

## 2023-02-04 DIAGNOSIS — Z7961 Long term (current) use of immunomodulator: Secondary | ICD-10-CM | POA: Diagnosis not present

## 2023-02-04 DIAGNOSIS — Z79899 Other long term (current) drug therapy: Secondary | ICD-10-CM | POA: Diagnosis not present

## 2023-02-04 DIAGNOSIS — Z5112 Encounter for antineoplastic immunotherapy: Secondary | ICD-10-CM | POA: Diagnosis not present

## 2023-02-04 DIAGNOSIS — R109 Unspecified abdominal pain: Secondary | ICD-10-CM | POA: Diagnosis not present

## 2023-02-04 DIAGNOSIS — R197 Diarrhea, unspecified: Secondary | ICD-10-CM | POA: Diagnosis not present

## 2023-02-04 DIAGNOSIS — Z7952 Long term (current) use of systemic steroids: Secondary | ICD-10-CM | POA: Diagnosis not present

## 2023-02-04 DIAGNOSIS — L89154 Pressure ulcer of sacral region, stage 4: Secondary | ICD-10-CM | POA: Diagnosis not present

## 2023-02-04 LAB — CBC WITH DIFFERENTIAL (CANCER CENTER ONLY)
Abs Immature Granulocytes: 0.03 10*3/uL (ref 0.00–0.07)
Basophils Absolute: 0.2 10*3/uL — ABNORMAL HIGH (ref 0.0–0.1)
Basophils Relative: 2 %
Eosinophils Absolute: 0.1 10*3/uL (ref 0.0–0.5)
Eosinophils Relative: 1 %
HCT: 34.8 % — ABNORMAL LOW (ref 36.0–46.0)
Hemoglobin: 11.3 g/dL — ABNORMAL LOW (ref 12.0–15.0)
Immature Granulocytes: 0 %
Lymphocytes Relative: 21 %
Lymphs Abs: 2.3 10*3/uL (ref 0.7–4.0)
MCH: 33.5 pg (ref 26.0–34.0)
MCHC: 32.5 g/dL (ref 30.0–36.0)
MCV: 103.3 fL — ABNORMAL HIGH (ref 80.0–100.0)
Monocytes Absolute: 1.1 10*3/uL — ABNORMAL HIGH (ref 0.1–1.0)
Monocytes Relative: 10 %
Neutro Abs: 7.4 10*3/uL (ref 1.7–7.7)
Neutrophils Relative %: 66 %
Platelet Count: 276 10*3/uL (ref 150–400)
RBC: 3.37 MIL/uL — ABNORMAL LOW (ref 3.87–5.11)
RDW: 15.1 % (ref 11.5–15.5)
WBC Count: 11.1 10*3/uL — ABNORMAL HIGH (ref 4.0–10.5)
nRBC: 0 % (ref 0.0–0.2)

## 2023-02-04 MED ORDER — DIPHENHYDRAMINE HCL 25 MG PO CAPS
50.0000 mg | ORAL_CAPSULE | Freq: Once | ORAL | Status: AC
  Administered 2023-02-04: 50 mg via ORAL
  Filled 2023-02-04: qty 2

## 2023-02-04 MED ORDER — ACETAMINOPHEN 325 MG PO TABS
650.0000 mg | ORAL_TABLET | Freq: Once | ORAL | Status: AC
  Administered 2023-02-04: 650 mg via ORAL
  Filled 2023-02-04: qty 2

## 2023-02-04 MED ORDER — DEXAMETHASONE 4 MG PO TABS: ORAL_TABLET | ORAL | 3 refills | Status: AC

## 2023-02-04 MED ORDER — DEXAMETHASONE 4 MG PO TABS
20.0000 mg | ORAL_TABLET | Freq: Once | ORAL | Status: AC
Start: 2023-02-04 — End: 2023-02-04
  Administered 2023-02-04: 20 mg via ORAL
  Filled 2023-02-04: qty 5

## 2023-02-04 MED ORDER — DARATUMUMAB-HYALURONIDASE-FIHJ 1800-30000 MG-UT/15ML ~~LOC~~ SOLN
1800.0000 mg | Freq: Once | SUBCUTANEOUS | Status: AC
  Administered 2023-02-04: 1800 mg via SUBCUTANEOUS
  Filled 2023-02-04: qty 15

## 2023-02-04 NOTE — Progress Notes (Signed)
[facility] [phone]   [phone]  OFFICE PROGRESS NOTE  [name], MD 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 Dorris Fetch on January 28, 2022.    CURRENT THERAPY:  1) Starting systemic therapy with Revlimid 21 days on 7 days off, Darzalex, and 20 mg of Decadron weekly.  First dose expected next week on 09/11/2022.  Status post 4 cycles.  2) Monthly Zometa injections for 6 to 12 months, followed by every 3 months thereon after once dental clearance is obtained.  INTERVAL HISTORY: [name] 82 y.o. female returns to the clinic today for follow-up visit accompanied by her son Windy Fast. Discussed the use of AI scribe software for clinical note transcription with the patient, who gave verbal consent to proceed.  History of Present Illness   The patient, an 82 year old individual diagnosed with multiple myeloma in May 2024, has been undergoing treatment with daratumumab, Revlimid, and Decadron. She has completed five cycles of this regimen and is currently initiating the sixth cycle. The patient reports increased bruising, with no known cause or associated trauma. She denies taking any blood thinners, including aspirin, and has a history of gastric issues with bleeding.  The patient also reports some swelling in both legs, which is more noticeable in the evenings. She attempts to alleviate this by elevating her legs when sitting. The patient's blood pressure has been noted to be on the higher side, and she has a history of hypertension, for which she was previously on medication.  The patient has also  been dealing with gastrointestinal issues, including diarrhea, for which she was prescribed Colestipol for two weeks. She has completed this course of treatment. Additionally, she was prescribed potassium, which she took for a week. The patient's hemoglobin level is currently 11.3, indicating a good response to the treatment for multiple myeloma.        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 by echo 10/2021   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/2015   Carotid artery stenosis    < 50% stenosis bilaterally by doppler 07/2016   Coronary artery disease 2008   Coronary Ca score of 331 with minimal multivessel plaque < 25% stenosis by coronary CTA 8/23   Heart murmur    per pt   Hypercholesteremia    Hypertension    Obesity    Osteopenia    declines treatment   Pneumonia 1995   Pulmonary HTN (HCC)    mild to moderate by echo 7/23 with PASP   Shoulder pain    Due to arthritis   Vitamin D insufficiency     ALLERGIES:  is allergic to crestor [rosuvastatin calcium], lactose, lipitor [atorvastatin], pravastatin, simvastatin, tramadol, zetia [ezetimibe], codeine, penicillins, sulfa antibiotics, and vancomycin.  MEDICATIONS:  Current Outpatient Medications  Medication Sig Dispense  Refill   acetaminophen (TYLENOL) 325 MG tablet Take 2 tablets (650 mg total) by mouth every 8 (eight) hours as needed.     acyclovir (ZOVIRAX) 200 MG capsule Take 1 capsule by mouth twice daily 30 capsule 0   allopurinol (ZYLOPRIM) 100 MG tablet Take 1 tablet (100 mg total) by mouth 2 (two) times daily. 60 tablet 2   ascorbic acid (VITAMIN C) 500 MG tablet Take 1 tablet (500 mg total) by mouth daily.     colestipol (COLESTID) 1 g tablet Take 1 g by mouth  daily.     daratumumab-hyaluronidase-fihj (DARZALEX FASPRO) 1800-30000 MG-UT/15ML SOLN Inject 1,800 mg into the skin once.     dexamethasone (DECADRON) 4 MG tablet Please take 5 tablets (20 mg) weekly on the days of treatment 40 tablet 3   HYDROcodone-acetaminophen (NORCO/VICODIN) 5-325 MG tablet Take 1 tablet by mouth every 6 (six) hours as needed for moderate pain.     lenalidomide (REVLIMID) 15 MG capsule TAKE 1 (15MG ) CAPSULE BY MOUTH DAILY 21 capsule 0   Multiple Vitamin (MULTIVITAMIN WITH MINERALS) TABS tablet Take 1 tablet by mouth daily.     naloxone (NARCAN) nasal spray 4 mg/0.1 mL      omeprazole (PRILOSEC) 20 MG capsule Take 1 capsule (20 mg total) by mouth daily. 30 capsule 3   oxycodone (OXY-IR) 5 MG capsule Take 5 mg by mouth every 6 (six) hours as needed for pain.     potassium chloride SA (KLOR-CON M) 20 MEQ tablet Take 1 tablet (20 mEq total) by mouth daily. 7 tablet 0   sodium hypochlorite (DAKIN'S 1/4 STRENGTH) 0.125 % SOLN 1 application Externally Once a day for 30 days     No current facility-administered medications for this visit.    SURGICAL HISTORY:  Past Surgical History:  Procedure Laterality Date   AORTIC VALVE REPLACEMENT N/A 10/14/2013   Procedure: AORTIC VALVE REPLACEMENT (AVR);  Surgeon: Alleen Borne, MD;  Location: [facility] OR;  Service: Open Heart Surgery;  Laterality: N/A;   CARDIAC CATHETERIZATION     CATARACT EXTRACTION, BILATERAL     CHOLECYSTECTOMY  04/08/1988   ESOPHAGOGASTRODUODENOSCOPY (EGD) WITH PROPOFOL N/A 09/05/2022   Procedure: ESOPHAGOGASTRODUODENOSCOPY (EGD) WITH PROPOFOL;  Surgeon: Kathi Der, MD;  Location: [facility] ENDOSCOPY;  Service: Gastroenterology;  Laterality: N/A;   INTRAOPERATIVE TRANSESOPHAGEAL ECHOCARDIOGRAM N/A 10/14/2013   Procedure: INTRAOPERATIVE TRANSESOPHAGEAL ECHOCARDIOGRAM;  Surgeon: Alleen Borne, MD;  Location: [facility] OR;  Service: Open Heart Surgery;  Laterality: N/A;   IVC FILTER INSERTION N/A 09/06/2022   Procedure: IVC  FILTER INSERTION;  Surgeon: Leonie Douglas, MD;  Location: [facility] INVASIVE CV LAB;  Service: Cardiovascular;  Laterality: N/A;   KYPHOPLASTY Bilateral 03/27/2022   Procedure: KYPHOPLASTY AND BIOPSY THORACIC ELEVEN;  Surgeon: Lisbeth Renshaw, MD;  Location: [facility] OR;  Service: Neurosurgery;  Laterality: Bilateral;   LEFT AND RIGHT HEART CATHETERIZATION WITH CORONARY ANGIOGRAM N/A 09/16/2013   Procedure: LEFT AND RIGHT HEART CATHETERIZATION WITH CORONARY ANGIOGRAM;  Surgeon: Quintella Reichert, MD;  Location: [facility] CATH LAB;  Service: Cardiovascular;  Laterality: N/A;   TONSILLECTOMY      REVIEW OF SYSTEMS:  A comprehensive review of systems was negative except for: Constitutional: positive for fatigue Gastrointestinal: positive for diarrhea Musculoskeletal: positive for arthralgias   PHYSICAL EXAMINATION: General appearance: alert, cooperative, fatigued, and no distress Head: Normocephalic, without obvious abnormality, atraumatic Neck: no adenopathy, no JVD, supple, symmetrical, trachea midline, and thyroid not enlarged, symmetric, no tenderness/mass/nodules Lymph nodes: Cervical, supraclavicular, and axillary nodes normal. Resp:  clear to auscultation bilaterally Back: symmetric, no curvature. ROM normal. No CVA tenderness. Cardio: regular rate and rhythm, S1, S2 normal, no murmur, click, rub or gallop GI: soft, non-tender; bowel sounds normal; no masses,  no organomegaly Extremities: extremities normal, atraumatic, no cyanosis or edema  ECOG PERFORMANCE STATUS: 1 - Symptomatic but completely ambulatory  Blood pressure (!) 172/53, pulse 62, temperature 97.6 F (36.4 C), temperature source Oral, resp. rate 17, weight 145 lb 6.4 oz (66 kg), SpO2 96%.  LABORATORY DATA: Lab Results  Component Value Date   WBC 11.1 (H) 02/04/2023   HGB 11.3 (L) 02/04/2023   HCT 34.8 (L) 02/04/2023   MCV 103.3 (H) 02/04/2023   PLT 276 02/04/2023      Chemistry      Component Value Date/Time   NA 141  01/22/2023 0946   NA 141 10/06/2020 0939   K 3.1 (L) 01/22/2023 0946   CL 104 01/22/2023 0946   CO2 26 01/22/2023 0946   BUN 15 01/22/2023 0946   BUN 17 10/06/2020 0939   CREATININE 0.81 01/22/2023 0946   CREATININE 0.85 01/29/2016 0910      Component Value Date/Time   CALCIUM 8.8 (L) 01/22/2023 0946   ALKPHOS 134 (H) 01/22/2023 0946   AST 26 01/22/2023 0946   ALT 31 01/22/2023 0946   BILITOT 0.6 01/22/2023 0946       RADIOGRAPHIC STUDIES: ECHOCARDIOGRAM COMPLETE  Result Date: 01/16/2023    ECHOCARDIOGRAM REPORT   Patient Name:   [name]  Date of Exam: 01/16/2023 Medical Rec #:  [id number]     Height:       61.5 in Accession #:    [id number]    Weight:       148.8 lb Date of Birth:  Oct 29, 1940     BSA:          1.676 m Patient Age:    82 years      BP:           127/57 mmHg Patient Gender: F             HR:           71 bpm. Exam Location:  [address] Procedure: 2D Echo, Cardiac Doppler and Color Doppler Indications:    I35.0 Aortic Stenosis  History:        Patient has prior history of Echocardiogram examinations, most                 recent 10/10/2021. CAD, AVR-2015, Signs/Symptoms:Murmur; Risk                 Factors:Hypertension and Dyslipidemia. Pulmonary hypertension.                 Pneumonia.                 Aortic Valve: 23 mm Edwards Magna-Ease pericardial valve valve                 is present in the aortic position. Procedure Date: 10/14/2013.  Sonographer:    Daphine Deutscher RDCS Referring Phys: [id number] Evern Bio WEAVER IMPRESSIONS  1. Left ventricular ejection fraction, by estimation, is 60 to 65%. The left ventricle has normal function. The left ventricle has no regional wall motion abnormalities. Left ventricular diastolic parameters are consistent with Grade I diastolic dysfunction (impaired relaxation).  2. Right ventricular systolic function is normal. The right ventricular size is normal. There is normal pulmonary artery systolic pressure. The estimated right  ventricular systolic pressure is 26.4 mmHg.  3. Left atrial size was moderately dilated.  4. The mitral valve is normal in structure. Mild mitral valve regurgitation. No evidence of mitral stenosis.  5. The aortic valve has been repaired/replaced. Aortic valve regurgitation is not visualized. No aortic stenosis is present. There is a 23 mm Edwards Magna-Ease pericardial valve valve present in the aortic position. Procedure Date: 10/14/2013. Aortic valve mean gradient measures 15.2 mmHg. Aortic valve Vmax measures 2.70 m/s.  6. Aortic dilatation noted. There is mild dilatation of the ascending aorta, measuring 40 mm.  7. The inferior vena cava is normal in size with greater than 50% respiratory variability, suggesting right atrial pressure of 3 mmHg. Comparison(s): No significant change from prior study. Prior images reviewed side by side. Prior AVR mean gradient 17 mmHg. FINDINGS  Left Ventricle: Left ventricular ejection fraction, by estimation, is 60 to 65%. The left ventricle has normal function. The left ventricle has no regional wall motion abnormalities. The left ventricular internal cavity size was normal in size. There is  no left ventricular hypertrophy. Left ventricular diastolic parameters are consistent with Grade I diastolic dysfunction (impaired relaxation). Right Ventricle: The right ventricular size is normal. No increase in right ventricular wall thickness. Right ventricular systolic function is normal. There is normal pulmonary artery systolic pressure. The tricuspid regurgitant velocity is 2.42 m/s, and  with an assumed right atrial pressure of 3 mmHg, the estimated right ventricular systolic pressure is 26.4 mmHg. Left Atrium: Left atrial size was moderately dilated. Right Atrium: Right atrial size was normal in size. Pericardium: There is no evidence of pericardial effusion. Mitral Valve: The mitral valve is normal in structure. Mild mitral valve regurgitation. No evidence of mitral valve  stenosis. Tricuspid Valve: The tricuspid valve is normal in structure. Tricuspid valve regurgitation is mild . No evidence of tricuspid stenosis. The aortic valve has been repaired/replaced. Aortic valve regurgitation is not visualized. No aortic stenosis is present. There is a 23 mm Edwards Magna-Ease pericardial valve valve present in the aortic position. Procedure Date: 10/14/2013. Pulmonic Valve: The pulmonic valve was normal in structure. Pulmonic valve regurgitation is not visualized. No evidence of pulmonic stenosis. Aorta: Aortic dilatation noted. There is mild dilatation of the ascending aorta, measuring 40 mm. Venous: The inferior vena cava is normal in size with greater than 50% respiratory variability, suggesting right atrial pressure of 3 mmHg. IAS/Shunts: No atrial level shunt detected by color flow Doppler.  LEFT VENTRICLE PLAX 2D LVIDd:         4.80 cm Diastology LVIDs:         2.50 cm LV e' medial:    5.50 cm/s LV PW:         0.90 cm LV E/e' medial:  12.3 LV IVS:        0.90 cm LV e' lateral:   8.34 cm/s                        LV E/e' lateral: 8.1  RIGHT VENTRICLE             IVC RV Basal diam:  3.80 cm     IVC diam: 1.30 cm RV S prime:     16.50 cm/s TAPSE (M-mode): 1.5 cm LEFT ATRIUM             Index        RIGHT ATRIUM           Index LA diam:  5.10 cm 3.04 cm/m   RA Area:     11.10 cm LA Vol (A2C):   55.6 ml 33.18 ml/m  RA Volume:   26.60 ml  15.87 ml/m LA Vol (A4C):   23.5 ml 14.02 ml/m LA Biplane Vol: 37.7 ml 22.50 ml/m  AORTIC VALVE AV Vmax:           269.60 cm/s AV Vmean:          179.600 cm/s AV VTI:            0.508 m AV Peak Grad:      29.1 mmHg AV Mean Grad:      15.2 mmHg LVOT Vmax:         135.00 cm/s LVOT Vmean:        90.700 cm/s LVOT VTI:          0.302 m LVOT/AV VTI ratio: 0.59  AORTA Ao Root diam: 3.50 cm Ao Asc diam:  4.00 cm MITRAL VALVE                TRICUSPID VALVE MV Area (PHT): 2.69 cm     TR Peak grad:   23.4 mmHg MV Decel Time: 282 msec     TR Vmax:         242.00 cm/s MV E velocity: 67.75 cm/s MV A velocity: 124.00 cm/s  SHUNTS MV E/A ratio:  0.55         Systemic VTI: 0.30 m Donato Schultz MD Electronically signed by Donato Schultz MD Signature Date/Time: 01/16/2023/2:36:24 PM    Final     ASSESSMENT AND PLAN: This is a very pleasant 82 years old white female with  1) history of stage I (T1a, N0, M0) thymoma type AB diagnosed in October 2023 status post resection under the care of Dr. Dorris Fetch on January 28, 2022 with close resection margin and microscopic infiltration of the capsule. 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  The patient is undergoing systemic therapy with daratumumab, Revlimid 15 mg p.o. daily for 21 days every 4 weeks in addition to Decadron 20 mg weekly with the treatment.  Status post 5 cycles.  She has been tolerating her treatment fairly well.    Multiple Myeloma Currently on cycle 6 of treatment with Daratumumab, Revlimid, and Decadron. Hemoglobin improved to 11.3, indicating good response to treatment. -Continue current treatment regimen. -Next appointment in 4 weeks to start cycle 7.  Bruising Noted increased bruising, no known trauma or use of blood thinners. -Monitor for further bruising.  Lower extremity edema Bilateral, worsens by evening. -Advise to elevate legs when sitting.  Hypertension Blood pressure slightly elevated, previously on antihypertensive medication which was discontinued. -Check blood pressure at home. If systolic consistently over 150, resume one of the previously prescribed antihypertensive medications.  Gastrointestinal issues Previously had bleeding issues, currently resolved. -Continue current management.  Potassium level Recently on potassium supplement. -Check potassium level and resume supplement if needed.   The patient was advised to call immediately if she has any other concerning symptoms in the interval. The patient voices understanding of current disease  status and treatment options and is in agreement with the current care plan.  All questions were answered. The patient knows to call the clinic with any problems, questions or concerns. We can certainly see the patient much sooner if necessary.  The total time spent in the appointment was 20 minutes.  Disclaimer: This note was dictated with voice recognition software. Similar sounding words  can inadvertently be transcribed and may not be corrected upon review.

## 2023-02-04 NOTE — Telephone Encounter (Signed)
Refill decadron

## 2023-02-04 NOTE — Addendum Note (Signed)
Addended by: Charma Igo on: 02/04/2023 05:42 PM   Modules accepted: Orders

## 2023-02-04 NOTE — Patient Instructions (Signed)
[facility]  Discharge Instructions: Thank you for choosing [facility] to provide your oncology and hematology care.   If you have a lab appointment with the Cancer Center, please go directly to the Cancer Center and check in at the registration area.   Wear comfortable clothing and clothing appropriate for easy access to any Portacath or PICC line.   We strive to give you quality time with your provider. You may need to reschedule your appointment if you arrive late (15 or more minutes).  Arriving late affects you and other patients whose appointments are after yours.  Also, if you miss three or more appointments without notifying the office, you may be dismissed from the clinic at the provider's discretion.      For prescription refill requests, have your pharmacy contact our office and allow 72 hours for refills to be completed.    Today you received the following chemotherapy and/or immunotherapy agents Darzalex Faspro      To help prevent nausea and vomiting after your treatment, we encourage you to take your nausea medication as directed.  BELOW ARE SYMPTOMS THAT SHOULD BE REPORTED IMMEDIATELY: *FEVER GREATER THAN 100.4 F (38 C) OR HIGHER *CHILLS OR SWEATING *NAUSEA AND VOMITING THAT IS NOT CONTROLLED WITH YOUR NAUSEA MEDICATION *UNUSUAL SHORTNESS OF BREATH *UNUSUAL BRUISING OR BLEEDING *URINARY PROBLEMS (pain or burning when urinating, or frequent urination) *BOWEL PROBLEMS (unusual diarrhea, constipation, pain near the anus) TENDERNESS IN MOUTH AND THROAT WITH OR WITHOUT PRESENCE OF ULCERS (sore throat, sores in mouth, or a toothache) UNUSUAL RASH, SWELLING OR PAIN  UNUSUAL VAGINAL DISCHARGE OR ITCHING   Items with * indicate a potential emergency and should be followed up as soon as possible or go to the Emergency Department if any problems should occur.  Please show the CHEMOTHERAPY ALERT CARD or IMMUNOTHERAPY ALERT CARD at  check-in to the Emergency Department and triage nurse.  Should you have questions after your visit or need to cancel or reschedule your appointment, please contact [facility]  Dept: [phone]  and follow the prompts.  Office hours are 8:00 a.m. to 4:30 p.m. Monday - Friday. Please note that voicemails left after 4:00 p.m. may not be returned until the following business day.  We are closed weekends and major holidays. You have access to a nurse at all times for urgent questions. Please call the main number to the clinic Dept: [phone] and follow the prompts.   For any non-urgent questions, you may also contact your provider using MyChart. We now offer e-Visits for anyone 26 and older to request care online for non-urgent symptoms. For details visit [email or web address].   Also download the MyChart app! Go to the app store, search "MyChart", open the app, select [facility], and log in with your MyChart username and password.  Zoledronic Acid Injection (Cancer) What is this medication? ZOLEDRONIC ACID (ZOE le dron ik AS id) treats high calcium levels in the blood caused by cancer. It may also be used with chemotherapy to treat weakened bones caused by cancer. It works by slowing down the release of calcium from bones. This lowers calcium levels in your blood. It also makes your bones stronger and less likely to break (fracture). It belongs to a group of medications called bisphosphonates. This medicine may be used for other purposes; ask your health care provider or pharmacist if you have questions. COMMON BRAND NAME(S): Zometa, Zometa Powder What  should I tell my care team before I take this medication? They need to know if you have any of these conditions: Dehydration Dental disease Kidney disease Liver disease Low levels of calcium in the blood Lung or breathing disease, such as asthma Receiving steroids, such as dexamethasone or prednisone An  unusual or allergic reaction to zoledronic acid, other medications, foods, dyes, or preservatives Pregnant or trying to get pregnant Breast-feeding How should I use this medication? This medication is injected into a vein. It is given by your care team in a hospital or clinic setting. Talk to your care team about the use of this medication in children. Special care may be needed. Overdosage: If you think you have taken too much of this medicine contact a poison control center or emergency room at once. NOTE: This medicine is only for you. Do not share this medicine with others. What if I miss a dose? Keep appointments for follow-up doses. It is important not to miss your dose. Call your care team if you are unable to keep an appointment. What may interact with this medication? Certain antibiotics given by injection Diuretics, such as bumetanide, furosemide NSAIDs, medications for pain and inflammation, such as ibuprofen or naproxen Teriparatide Thalidomide This list may not describe all possible interactions. Give your health care provider a list of all the medicines, herbs, non-prescription drugs, or dietary supplements you use. Also tell them if you smoke, drink alcohol, or use illegal drugs. Some items may interact with your medicine. What should I watch for while using this medication? Visit your care team for regular checks on your progress. It may be some time before you see the benefit from this medication. Some people who take this medication have severe bone, joint, or muscle pain. This medication may also increase your risk for jaw problems or a broken thigh bone. Tell your care team right away if you have severe pain in your jaw, bones, joints, or muscles. Tell you care team if you have any pain that does not go away or that gets worse. Tell your dentist and dental surgeon that you are taking this medication. You should not have major dental surgery while on this medication. See your  dentist to have a dental exam and fix any dental problems before starting this medication. Take good care of your teeth while on this medication. Make sure you see your dentist for regular follow-up appointments. You should make sure you get enough calcium and vitamin D while you are taking this medication. Discuss the foods you eat and the vitamins you take with your care team. Check with your care team if you have severe diarrhea, nausea, and vomiting, or if you sweat a lot. The loss of too much body fluid may make it dangerous for you to take this medication. You may need bloodwork while taking this medication. Talk to your care team if you wish to become pregnant or think you might be pregnant. This medication can cause serious birth defects. What side effects may I notice from receiving this medication? Side effects that you should report to your care team as soon as possible: Allergic reactions--skin rash, itching, hives, swelling of the face, lips, tongue, or throat Kidney injury--decrease in the amount of urine, swelling of the ankles, hands, or feet Low calcium level--muscle pain or cramps, confusion, tingling, or numbness in the hands or feet Osteonecrosis of the jaw--pain, swelling, or redness in the mouth, numbness of the jaw, poor healing after dental work, unusual   discharge from the mouth, visible bones in the mouth Severe bone, joint, or muscle pain Side effects that usually do not require medical attention (report to your care team if they continue or are bothersome): Constipation Fatigue Fever Loss of appetite Nausea Stomach pain This list may not describe all possible side effects. Call your doctor for medical advice about side effects. You may report side effects to FDA at 1-800-FDA-1088. Where should I keep my medication? This medication is given in a hospital or clinic. It will not be stored at home. NOTE: This sheet is a summary. It may not cover all possible information.  If you have questions about this medicine, talk to your doctor, pharmacist, or health care provider.  2024 Elsevier/Gold Standard (2021-05-18 00:00:00)

## 2023-02-05 ENCOUNTER — Telehealth: Payer: Self-pay | Admitting: Cardiology

## 2023-02-05 NOTE — Telephone Encounter (Signed)
Pts husband advised...  Check BP twice daily for a week and call with results and will makd decision what to do at that time

## 2023-02-05 NOTE — Telephone Encounter (Signed)
Left message to call back  

## 2023-02-05 NOTE — Telephone Encounter (Addendum)
Was d/c during hospitalization 08/30/2022.Marland KitchenMarland KitchenMarland KitchenMarland Kitchen Still have them discuss w/ PCP?

## 2023-02-05 NOTE — Telephone Encounter (Signed)
Pt c/o medication issue:  1. Name of Medication:   lisinopril (ZESTRIL) 10 MG tablet [[id number]]  hydrochlorothiazide (MICROZIDE) 12.5 MG capsule   2. How are you currently taking this medication (dosage and times per day)?   Currently not taking  3. Are you having a reaction (difficulty breathing--STAT)?   4. What is your medication issue?   Son Ferne Reus) stated patient was taken off this medication in late May but patient's BP has been creeping back up and wants to know if patient should be put back on these medication.  Son noted patient's BP reading yesterday was 170/53.

## 2023-02-11 ENCOUNTER — Ambulatory Visit: Payer: Medicare PPO

## 2023-02-11 ENCOUNTER — Ambulatory Visit: Payer: Medicare PPO | Admitting: Physician Assistant

## 2023-02-11 ENCOUNTER — Other Ambulatory Visit: Payer: Medicare PPO

## 2023-02-11 ENCOUNTER — Encounter: Payer: Medicare PPO | Admitting: Dietician

## 2023-02-12 ENCOUNTER — Telehealth: Payer: Self-pay | Admitting: Cardiology

## 2023-02-12 ENCOUNTER — Other Ambulatory Visit: Payer: Self-pay | Admitting: *Deleted

## 2023-02-12 DIAGNOSIS — C9000 Multiple myeloma not having achieved remission: Secondary | ICD-10-CM

## 2023-02-12 DIAGNOSIS — Z79899 Other long term (current) drug therapy: Secondary | ICD-10-CM

## 2023-02-12 MED ORDER — ACYCLOVIR 200 MG PO CAPS: 200.0000 mg | ORAL_CAPSULE | Freq: Two times a day (BID) | ORAL | 0 refills | Status: DC

## 2023-02-12 NOTE — Telephone Encounter (Signed)
Son is calling to give bp numbers. Please advise   10/30 5:00pm 163/70 10/31 8:34am 177/76; 3:30pm 169/72 11/1   9:00am 168/73; 7:45pm 159/75 11/2   9:40am 179/80; 7:11pm 174/78 11/3   8:30am 145/75; 6:05pm 170/75 11/4   9:10am 172/73; 5:35pm 180/77 11/5   6:57am 174/80; 8:45pm 165/73

## 2023-02-13 ENCOUNTER — Ambulatory Visit
Admission: RE | Admit: 2023-02-13 | Discharge: 2023-02-13 | Disposition: A | Discharge status: Home / Self Care | Payer: Medicare PPO | Source: Ambulatory Visit | Attending: Internal Medicine | Admitting: Internal Medicine

## 2023-02-13 ENCOUNTER — Other Ambulatory Visit: Payer: Self-pay | Admitting: Internal Medicine

## 2023-02-13 DIAGNOSIS — C9000 Multiple myeloma not having achieved remission: Secondary | ICD-10-CM | POA: Diagnosis not present

## 2023-02-13 DIAGNOSIS — R0781 Pleurodynia: Secondary | ICD-10-CM

## 2023-02-13 DIAGNOSIS — R918 Other nonspecific abnormal finding of lung field: Secondary | ICD-10-CM | POA: Diagnosis not present

## 2023-02-13 DIAGNOSIS — I10 Essential (primary) hypertension: Secondary | ICD-10-CM | POA: Diagnosis not present

## 2023-02-13 NOTE — Telephone Encounter (Signed)
patient's son is returning phone call back. said no one has returned his phone call and is kind of frustrated

## 2023-02-13 NOTE — Telephone Encounter (Signed)
Spoke with patient's son Ferne Reus [facility]) who states patient has been off lisinopril and hydrochlorathiazide since May/June of 2024. 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 He is not sure who took her off the medication. Records in Epic indicate they were discontinued by Dr. Dayna Barker. Forwarded to Dr. Mayford Knife for review.

## 2023-02-14 ENCOUNTER — Encounter: Payer: Self-pay | Admitting: Internal Medicine

## 2023-02-14 ENCOUNTER — Other Ambulatory Visit: Payer: Self-pay | Admitting: Physician Assistant

## 2023-02-14 ENCOUNTER — Telehealth: Payer: Self-pay | Admitting: Medical Oncology

## 2023-02-14 NOTE — Telephone Encounter (Signed)
Wound Clinic conflict with med onc nov 25th. [name] left message with wound clinic to see about changing pt appt.

## 2023-02-14 NOTE — Telephone Encounter (Signed)
Appts confirmed with son.  Had CXR today for pain -( Dr Donette Larry ). Son wanted to make sure [facility] can see the results.. He does not know results.[name]

## 2023-02-14 NOTE — Telephone Encounter (Signed)
Appt changed to 11/25. Son will try to move wound center appt.

## 2023-02-17 ENCOUNTER — Encounter: Payer: Medicare PPO | Attending: Physician Assistant | Admitting: Physician Assistant

## 2023-02-17 DIAGNOSIS — M6281 Muscle weakness (generalized): Secondary | ICD-10-CM | POA: Insufficient documentation

## 2023-02-17 DIAGNOSIS — L89154 Pressure ulcer of sacral region, stage 4: Secondary | ICD-10-CM | POA: Diagnosis not present

## 2023-02-17 DIAGNOSIS — L89153 Pressure ulcer of sacral region, stage 3: Secondary | ICD-10-CM | POA: Diagnosis not present

## 2023-02-17 DIAGNOSIS — C9000 Multiple myeloma not having achieved remission: Secondary | ICD-10-CM | POA: Insufficient documentation

## 2023-02-17 NOTE — Progress Notes (Addendum)
SAINT, Kol[place] B ([id number]) [id number] Page 1 of 8 Visit Report for 02/17/2023 Arrival Information Details Patient Name: Date of Service: ILAMAE, KISHBAUGH [place] B. 02/17/2023 12:15 PM Medical Record Number: [id number] Patient Account Number: [id number] Date of Birth/Sex: Treating RN: 1941/01/17 (82 y.o. [name] Primary Care Renuka Farfan: Hillard Danker Other Clinician: Referring Zacari Radick: Treating Horris Speros/Extender: Madelynn Done Weeks in Treatment: 17 Visit Information History Since Last Visit Added or deleted any medications: No Patient Arrived: Walker Any new allergies or adverse reactions: No Arrival Time: 12:37 Has Dressing in Place as Prescribed: Yes Accompanied By: caretaker Pain Present Now: No Transfer Assistance: None Patient Identification Verified: Yes Secondary Verification Process Completed: Yes Patient Requires Transmission-Based Precautions: No Patient Has Alerts: Yes Patient Alerts: Not Diabetic Electronic Signature(s) Signed: 02/17/2023 4:54:56 PM By: Midge Aver MSN RN CNS WTA Entered By: Midge Aver on 02/17/2023 09:37:49 -------------------------------------------------------------------------------- Clinic Level of Care Assessment Details Patient Name: Date of Service: NASIM, LANDS NN B. 02/17/2023 12:15 PM Medical Record Number: [id number] Patient Account Number: [id number] Date of Birth/Sex: Treating RN: 04/02/1941 (82 y.o. [name] Primary Care Hedi Barkan: Hillard Danker Other Clinician: Referring Candie Gintz: Treating Unnamed Zeien/Extender: Madelynn Done Weeks in Treatment: 17 Clinic Level of Care Assessment Items TOOL 4 Quantity Score X- 1 0 Use when only an EandM is performed on FOLLOW-UP visit ASSESSMENTS - Nursing Assessment / Reassessment X- 1 10 Reassessment of Co-morbidities (includes updates in patient status) X- 1 5 Reassessment of Adherence to Treatment Plan ASSESSMENTS - Wound and Skin A ssessment /  Reassessment X - Simple Wound Assessment / Reassessment - one wound 1 5 Tooley, Yisell B ([id number]) [id number] Page 2 of 8 []  - 0 Complex Wound Assessment / Reassessment - multiple wounds []  - 0 Dermatologic / Skin Assessment (not related to wound area) ASSESSMENTS - Focused Assessment []  - 0 Circumferential Edema Measurements - multi extremities []  - 0 Nutritional Assessment / Counseling / Intervention []  - 0 Lower Extremity Assessment (monofilament, tuning fork, pulses) []  - 0 Peripheral Arterial Disease Assessment (using hand held doppler) ASSESSMENTS - Ostomy and/or Continence Assessment and Care []  - 0 Incontinence Assessment and Management []  - 0 Ostomy Care Assessment and Management (repouching, etc.) PROCESS - Coordination of Care X - Simple Patient / Family Education for ongoing care 1 15 []  - 0 Complex (extensive) Patient / Family Education for ongoing care X- 1 10 Staff obtains Chiropractor, Records, T Results / Process Orders est []  - 0 Staff telephones HHA, Nursing Homes / Clarify orders / etc []  - 0 Routine Transfer to another Facility (non-emergent condition) []  - 0 Routine Hospital Admission (non-emergent condition) []  - 0 New Admissions / Manufacturing engineer / Ordering NPWT Apligraf, etc. , []  - 0 Emergency Hospital Admission (emergent condition) X- 1 10 Simple Discharge Coordination []  - 0 Complex (extensive) Discharge Coordination PROCESS - Special Needs []  - 0 Pediatric / Minor Patient Management []  - 0 Isolation Patient Management []  - 0 Hearing / Language / Visual special needs []  - 0 Assessment of Community assistance (transportation, D/C planning, etc.) []  - 0 Additional assistance / Altered mentation []  - 0 Support Surface(s) Assessment (bed, cushion, seat, etc.) INTERVENTIONS - Wound Cleansing / Measurement X - Simple Wound Cleansing - one wound 1 5 []  - 0 Complex Wound Cleansing - multiple wounds X- 1  5 Wound Imaging (photographs - any number of wounds) []  - 0 Wound Tracing (instead of photographs) X- 1 5 Simple Wound Measurement - one wound []  - 0  Complex Wound Measurement - multiple wounds INTERVENTIONS - Wound Dressings X - Small Wound Dressing one or multiple wounds 1 10 []  - 0 Medium Wound Dressing one or multiple wounds []  - 0 Large Wound Dressing one or multiple wounds []  - 0 Application of Medications - topical []  - 0 Application of Medications - injection INTERVENTIONS - Miscellaneous []  - 0 External ear exam []  - 0 Specimen Collection (cultures, biopsies, blood, body fluids, etc.) []  - 0 Specimen(s) / Culture(s) sent or taken to Lab for analysis Abruzzo, Amyiah B ([id number]) [id number] Page 3 of 8 []  - 0 Patient Transfer (multiple staff / Nurse, adult / Similar devices) []  - 0 Simple Staple / Suture removal (25 or less) []  - 0 Complex Staple / Suture removal (26 or more) []  - 0 Hypo / Hyperglycemic Management (close monitor of Blood Glucose) []  - 0 Ankle / Brachial Index (ABI) - do not check if billed separately X- 1 5 Vital Signs Has the patient been seen at the hospital within the last three years: Yes Total Score: 85 Level Of Care: New/Established - Level 3 Electronic Signature(s) Signed: 04/04/2023 12:17:45 PM By: Midge Aver MSN RN CNS WTA Entered By: Midge Aver on 02/24/2023 07:50:27 -------------------------------------------------------------------------------- Encounter Discharge Information Details Patient Name: Date of Service: Rich Number NN B. 02/17/2023 12:15 PM Medical Record Number: [id number] Patient Account Number: [id number] Date of Birth/Sex: Treating RN: [birth year] (82 y.o. [name] Primary Care Savana Spina: Hillard Danker Other Clinician: Referring Ellena Kamen: Treating Lossie Kalp/Extender: Madelynn Done Weeks in Treatment: 17 Encounter Discharge Information Items Discharge Condition:  Stable Ambulatory Status: Ambulatory Discharge Destination: Home Transportation: Private Auto Accompanied By: caretaker Schedule Follow-up Appointment: Yes Clinical Summary of Care: Electronic Signature(s) Signed: 02/24/2023 10:52:35 AM By: Midge Aver MSN RN CNS WTA Previous Signature: 02/24/2023 10:52:27 AM Version By: Midge Aver MSN RN CNS WTA Entered By: Midge Aver on 02/24/2023 07:52:35 -------------------------------------------------------------------------------- Lower Extremity Assessment Details Patient Name: Date of Service: MELLISSA, THAU NN B. 02/17/2023 12:15 PM Medical Record Number: [id number] Patient Account Number: [id number] Date of Birth/Sex: Treating RN: 05-26-1940 (82 y.o. Owens Loffler, Falon B ([id number]) [id number] Page 4 of 8 Primary Care Timmi Devora: Hillard Danker Other Clinician: Referring Keondre Markson: Treating Teniyah Seivert/Extender: Madelynn Done Weeks in Treatment: 17 Electronic Signature(s) Signed: 02/17/2023 4:54:56 PM By: Midge Aver MSN RN CNS WTA Entered By: Midge Aver on 02/17/2023 09:49:20 -------------------------------------------------------------------------------- Multi Wound Chart Details Patient Name: Date of Service: Rich Number NN B. 02/17/2023 12:15 PM Medical Record Number: [id number] Patient Account Number: [id number] Date of Birth/Sex: Treating RN: 23-Aug-1940 (82 y.o. [name] Primary Care Keyly Baldonado: Hillard Danker Other Clinician: Referring Amrie Gurganus: Treating Kenneshia Rehm/Extender: Madelynn Done Weeks in Treatment: 17 Vital Signs Height(in): Pulse(bpm): 66 Weight(lbs): Blood Pressure(mmHg): 149/55 Body Mass Index(BMI): Temperature(F): 98.2 Respiratory Rate(breaths/min): 18 [1:Photos:] [N/A:N/A] Midline Coccyx N/A N/A Wound Location: Pressure Injury N/A N/A Wounding Event: Pressure Ulcer N/A N/A Primary Etiology: Hypertension, Received N/A N/A Comorbid  History: Chemotherapy 08/30/2022 N/A N/A Date Acquired: 17 N/A N/A Weeks of Treatment: Open N/A N/A Wound Status: No N/A N/A Wound Recurrence: 2x0.3x0.2 N/A N/A Measurements L x W x D (cm) 0.471 N/A N/A A (cm) : rea 0.094 N/A N/A Volume (cm) : 98.90% N/A N/A % Reduction in A rea: 99.90% N/A N/A % 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 N/A N/A Classification: Medium N/A N/A Exudate A mount: Serosanguineous N/A N/A Exudate Type: red, brown N/A N/A Exudate Color: Medium (34-66%) N/A N/A Granulation A mount: Red, Pink  N/A N/A Granulation Quality: Medium (34-66%) N/A N/A Necrotic A mount: Fat Layer (Subcutaneous Tissue): Yes N/A N/A Exposed Structures: Bone: Yes Fascia: No Tendon: No Muscle: No Joint: No None N/A N/A Epithelialization: Umland, Arlana B ([id number]) [id number] Page 5 of 8 Treatment Notes Electronic Signature(s) Signed: 02/17/2023 4:54:56 PM By: Midge Aver MSN RN CNS WTA Entered By: Midge Aver on 02/17/2023 09:54:21 -------------------------------------------------------------------------------- Multi-Disciplinary Care Plan Details Patient Name: Date of Service: JAMEIKA, HENDEE NN B. 02/17/2023 12:15 PM Medical Record Number: [id number] Patient Account Number: [id number] Date of Birth/Sex: Treating RN: 1941/01/22 (82 y.o. [name] Primary Care Angelene Rome: Hillard Danker Other Clinician: Referring Roey Coopman: Treating Avianah Pellman/Extender: Madelynn Done Weeks in Treatment: 17 Active Inactive Wound/Skin Impairment Nursing Diagnoses: Impaired tissue integrity Knowledge deficit related to ulceration/compromised skin integrity Goals: Patient/caregiver will verbalize understanding of skin care regimen Date Initiated: 10/21/2022 Date Inactivated: 11/25/2022 Target Resolution Date: 11/21/2022 Goal Status: Met Ulcer/skin breakdown will have a volume reduction of 30% by week 4 Date Initiated: 10/21/2022 Date Inactivated:  11/25/2022 Target Resolution Date: 11/21/2022 Goal Status: Met Ulcer/skin breakdown will have a volume reduction of 50% by week 8 Date Initiated: 10/21/2022 Date Inactivated: 12/23/2022 Target Resolution Date: 12/22/2022 Goal Status: Met Ulcer/skin breakdown will have a volume reduction of 80% by week 12 Date Initiated: 10/21/2022 Date Inactivated: 01/13/2023 Target Resolution Date: 01/21/2023 Goal Status: Met Ulcer/skin breakdown will heal within 14 weeks Date Initiated: 10/21/2022 Target Resolution Date: 03/07/2023 Goal Status: Active Interventions: Assess patient/caregiver ability to obtain necessary supplies Assess patient/caregiver ability to perform ulcer/skin care regimen upon admission and as needed Assess ulceration(s) every visit Provide education on ulcer and skin care Treatment Activities: Patient referred to home care : 10/21/2022 Skin care regimen initiated : 10/21/2022 Topical wound management initiated : 10/21/2022 Notes: Electronic Signature(s) Signed: 02/24/2023 10:50:46 AM By: Midge Aver MSN RN CNS WTA Entered By: Midge Aver on 02/24/2023 07:50:46 Whisonant, Fred B ([id number]) [id number] Page 6 of 8 -------------------------------------------------------------------------------- Pain Assessment Details Patient Name: Date of Service: JHANE, VANDERZWAAG [place] B. 02/17/2023 12:15 PM Medical Record Number: [id number] Patient Account Number: [id number] Date of Birth/Sex: Treating RN: 05-Dec-1940 (82 y.o. [name] Primary Care Nayellie Sanseverino: Hillard Danker Other Clinician: Referring [place] Harral: Treating Elide Stalzer/Extender: Madelynn Done Weeks in Treatment: 17 Active Problems Location of Pain Severity and Description of Pain Patient Has Paino No Site Locations Pain Management and Medication Current Pain Management: Electronic Signature(s) Signed: 02/17/2023 4:54:56 PM By: Midge Aver MSN RN CNS WTA Entered By: Midge Aver on 02/17/2023  09:42:09 -------------------------------------------------------------------------------- Patient/Caregiver Education Details Patient Name: Date of Service: Myra Gianotti 11/11/2024andnbsp12:15 PM Medical Record Number: [id number] Patient Account Number: [id number] Date of Birth/Gender: Treating RN: October 18, 1940 (82 y.o. [name] Primary Care Physician: Hillard Danker Other Clinician: Referring Physician: Treating Physician/Extender: Madelynn Done Weeks in Treatment: [address], [place] B ([id number]) [id number] Page 7 of 8 Education Assessment Education Provided To: Patient and Caregiver Education Topics Provided Wound/Skin Impairment: Handouts: Caring for Your Ulcer Methods: Explain/Verbal Responses: State content correctly Electronic Signature(s) Signed: 04/04/2023 12:17:45 PM By: Midge Aver MSN RN CNS WTA Entered By: Midge Aver on 02/24/2023 07:50:56 -------------------------------------------------------------------------------- Wound Assessment Details Patient Name: Date of Service: Rich Number NN B. 02/17/2023 12:15 PM Medical Record Number: [id number] Patient Account Number: [id number] Date of Birth/Sex: Treating RN: [birth year] (82 y.o. [name] Primary Care Matthewjames Petrasek: Hillard Danker Other Clinician: Referring Dickie Cloe: Treating Patriciann Becht/Extender: Madelynn Done Weeks in Treatment: 17 Wound Status Wound Number: 1 Primary Etiology: Pressure  Ulcer Wound Location: Midline Coccyx Wound Status: Open Wounding Event: Pressure Injury Comorbid History: Hypertension, Received Chemotherapy Date Acquired: 08/30/2022 Weeks Of Treatment: 17 Clustered Wound: No Photos Wound Measurements Length: (cm) 2 Width: (cm) 0.3 Depth: (cm) 0.2 Area: (cm) 0.471 Volume: (cm) 0.094 % Reduction in Area: 98.9% % Reduction in Volume: 99.9% Epithelialization: None Wound Description Classification: Category/Stage IV Exudate Amount:  Medium Chausse, Kaisa B ([id number]) Exudate Type: Serosanguineous Exudate Color: red, brown Foul Odor After Cleansing: No Slough/Fibrino Yes [id number] Page 8 of 8 Wound Bed Granulation Amount: Medium (34-66%) Exposed Structure Granulation Quality: Red, Pink Fascia Exposed: No Necrotic Amount: Medium (34-66%) Fat Layer (Subcutaneous Tissue) Exposed: Yes Tendon Exposed: No Muscle Exposed: No Joint Exposed: No Bone Exposed: Yes Electronic Signature(s) Signed: 02/17/2023 4:54:56 PM By: Midge Aver MSN RN CNS WTA Entered By: Midge Aver on 02/17/2023 09:47:42 -------------------------------------------------------------------------------- Vitals Details Patient Name: Date of Service: Rich Number NN B. 02/17/2023 12:15 PM Medical Record Number: [id number] Patient Account Number: [id number] Date of Birth/Sex: Treating RN: 03/23/1941 (82 y.o. [name] Primary Care Tiler Brandis: Hillard Danker Other Clinician: Referring Simran Bomkamp: Treating Winna Golla/Extender: Madelynn Done Weeks in Treatment: 17 Vital Signs Time Taken: 12:40 Temperature (F): 98.2 Pulse (bpm): 66 Respiratory Rate (breaths/min): 18 Blood Pressure (mmHg): 149/55 Reference Range: 80 - 120 mg / dl Electronic Signature(s) Signed: 02/17/2023 4:54:56 PM By: Midge Aver MSN RN CNS WTA Entered By: Midge Aver on 02/17/2023 09:42:03

## 2023-02-17 NOTE — Progress Notes (Signed)
PEARL, Naiara [name] ([id number]) [id number] Page 1 of 6 Visit Report for 02/17/2023 Chief Complaint Document Details Patient Name: Date of Service: [name], [name] 02/17/2023 12:15 PM Medical Record Number: [id number] Patient Account Number: [id number] Date of Birth/Sex: Treating RN: Jul 15, 1940 (82 y.o. [name] Primary Care Provider: Hillard Danker Other Clinician: Referring Provider: Treating Provider/Extender: Madelynn Done Weeks in Treatment: 17 Information Obtained from: Patient Chief Complaint Stage 4 pressure ulcer Electronic Signature(s) Signed: 02/17/2023 12:52:51 PM By: Allen Derry PA-C Entered By: Allen Derry on 02/17/2023 12:52:51 -------------------------------------------------------------------------------- HPI Details Patient Name: Date of Service: [name] Number [name] 02/17/2023 12:15 PM Medical Record Number: [id number] Patient Account Number: [id number] Date of Birth/Sex: Treating RN: Feb 25, 1941 (82 y.o. 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 Currently there is not direct bone exposure because she is now covered with granulation tissue her family has been taking care of her at home at this point although she was previously in a facility. This is something that was noted when she ended up having to go to the ER initially on Aug 30, 2022. Subsequently during hospital stent it was started that she had gentamicin applied topically along with Dakin's moistened gauze packing which has been done at home now and seems to be doing very well. She was at [facility] skilled  nursing facility for time. She did have Levaquin previous which she did do very well for her as far as get the infection under control in my opinion I think she is actually seeming to do quite well at this point. She is not having any pain which is also excellent news at this time. She is starting to get around more so she is not completely bedbound although she is a little weak she does have family that is very attentive and taking great care of her. 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 There is an area of bone that is very thinly covered. Been using Dakin's wet-to-dry dressings. She has in-home caregivers 24/7 and also home health coming out to change dressings/participate in dressing supervision although most of her dressing changes are being done by her own in-home caregivers. 11-04-2022 upon evaluation today patient appears to be doing well currently in regard to her wound. With that being said I do feel like she is making really good progress towards healing currently. I do not see any evidence of active infection locally nor systemically which is excellent news as well. No fevers, chills, [name], [name] ([id number]) [id number] Page 2 of 6 nausea, vomiting, or diarrhea. 11-12-2022 patient's wound today appears to be doing okay although to be honest I am a little concerned about the fact that the wound VAC was not put on properly. It was not bridged she was laying on the tubing and this course was not very comfortable for her. Fortunately I do not see any signs of active infection locally or systemically which is great news. No fevers, chills, nausea, vomiting, or diarrhea. 11-18-2022 upon evaluation today patient appears to be doing well  currently in regard to her wound. This is actually  showing signs of improvement wound VAC was put on much more eloquently this time compared to last I am actually very pleased. He does not like there may be some irritation around the edges of the wound I think this may be subsequent to an issue going on with infection. We can look into trying to determine what this may be and then adjusting medications as needed to take care of that infection if so. 11-25-2022 upon evaluation today patient appears to be doing well currently in regard to her wound which is actually showing signs of improvement feeling little by little. The irritation around the edges of the wound is better and the wound VAC seems to have been doing well until he ran out of the DuoDERM and apparently last time this was put on the DuoDERM was not utilized. Again I think that is something that has been extremely beneficial for keeping this wound VAC in place without any complications in the past. 8/26; patient comes in with home health changing the wound VAC 3 times a week Monday Wednesday and Friday. Per our intake nurse the tunneling area superiorly has close down nicely. Progress has been good. She did not come in with a wound VAC on, they apparently took it off so she could be a but home health is coming in later today to change it. There is some concerns about next Monday being a holiday with regards to wound VAC change by home health 12-16-2022 upon evaluation today patient appears to be doing well currently in regard to her wound. She has been tolerating the dressing changes without complication. Fortunately there does not appear to be any signs of active infection locally or systemically which is great news. No fevers, chills, nausea, vomiting, or diarrhea. 12-23-2022 upon evaluation today patient appears to be doing well currently in regard to her sacral wound. This is actually showing signs of significant improvement. Fortunately I do not see any evidence of worsening this has  filled in quite nicely and really there is no significant depth to the wound is pretty much flush. I do believe that she may benefit from discontinuation of the VAC on the right least. On hold this week and see where things stand she is in agreement with that plan. 12-30-2022 upon evaluation today patient appears to be doing well currently in regard to her wound which is actually showing signs of excellent improvement. Fortunately I do not see any evidence of worsening overall and I do believe that the patient is making great headway towards complete closure. 01-06-2023 upon evaluation today patient appears to be doing well currently in regard to her wound on the sacral area this is actually showing signs of excellent improvement and actually very pleased with where we stand. Fortunately I do not see any signs of worsening in general. 01-13-2023 upon evaluation today patient appears to be doing excellent in regards to her wound which is actually showing signs of great improvement. The hypergranulation is greatly improved compared to last week after the silver nitrate that there is a little bit of necrotic tissue to remove that something I think I can do today without any significant complication. She is in agreement with that plan. 01-20-2023 upon evaluation today patient actually appears to be making good headway towards closure. Fortunately I do not see any signs of active infection at this time which is great news. No fevers, chills, nausea, vomiting, or diarrhea. 02-03-2023 upon  evaluation today patient's wound actually appears to be doing about the same as last time I saw her. Upon discussion with the family it was apparent that the patient may not be keeping the dressing on all the time. On the days when the aide is there she does make sure that is in place but on other days it is uncertain whether or not this is actually being kept in place and it sounds like the answer may be no. Nonetheless I  think that in general she seems to be making some headway here towards closure. 02-17-2023 upon evaluation today patient's wound does not appear to be bigger in size overall but unfortunately does have some bruising around the edges of the wound which is unfortunate. Fortunately I do not see any signs of active infection locally or systemically which is great news. No fevers, chills, nausea, vomiting, or diarrhea. Electronic Signature(s) Signed: 02/17/2023 1:04:50 PM By: Allen Derry PA-C Entered By: Allen Derry on 02/17/2023 13:04:49 -------------------------------------------------------------------------------- Physical Exam Details Patient Name: Date of Service: RYLAN, CLIMER [name] 02/17/2023 12:15 PM Medical Record Number: [id number] Patient Account Number: [id number] Date of Birth/Sex: Treating RN: 06/03/1940 (82 y.o. [name] Primary Care Provider: Hillard Danker Other Clinician: Referring Provider: Treating Provider/Extender: Madelynn Done Weeks in Treatment: 17 Constitutional Well-nourished and well-hydrated in no acute distress. Respiratory normal breathing without difficulty. [name], [name] ([id number]) [id number] Page 3 of 6 Psychiatric this patient is able to make decisions and demonstrates good insight into disease process. Alert and Oriented x 3. pleasant and cooperative. Notes Upon inspection patient's wound bed actually showed signs of good granulation in the center part of the wound unfortunately there is some bruising around the edges especially on the left side and this is evidence of excessive pressure occurring at the site. Fortunately I think this can be arrested quickly so that we do not end up having a significant issue here. Unfortunately I think that based on what I am seeing we are going to have some issues here with potential for breakdown around the edges of the wound we will see how things go over the next few  weeks. Electronic Signature(s) Signed: 02/17/2023 1:05:33 PM By: Allen Derry PA-C Entered By: Allen Derry on 02/17/2023 13:05:32 -------------------------------------------------------------------------------- Problem List Details Patient Name: Date of Service: ANGIA, WHEELESS [name] 02/17/2023 12:15 PM Medical Record Number: [id number] Patient Account Number: [id number] Date of Birth/Sex: Treating RN: 1941/02/12 (82 y.o. [name] Primary Care Provider: Hillard Danker Other Clinician: Referring Provider: Treating Provider/Extender: Madelynn Done Weeks in Treatment: 17 Active Problems ICD-10 Encounter Code Description Active Date MDM Diagnosis L89.154 Pressure ulcer of sacral region, stage 4 10/21/2022 No Yes M62.81 Muscle weakness (generalized) 10/21/2022 No Yes C90.00 Multiple myeloma not having achieved remission 10/21/2022 No Yes Inactive Problems Resolved Problems Electronic Signature(s) Signed: 02/17/2023 12:52:47 PM By: Allen Derry PA-C Entered By: Allen Derry on 02/17/2023 12:52:47 Bardsley, Asaiah [name] ([id number]) [id number] Page 4 of 6 -------------------------------------------------------------------------------- Progress Note Details Patient Name: Date of Service: [name], [name] 02/17/2023 12:15 PM Medical Record Number: [id number] Patient Account Number: [id number] Date of Birth/Sex: Treating RN: [birth year] (82 y.o. 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 Currently there is not direct bone  exposure because she is now covered with granulation tissue her family has been taking care of her at home at this point although she was previously in a facility. This is something that was noted when she ended up having to go to the ER initially on Aug 30, 2022. Subsequently during hospital stent it was started that she had gentamicin applied topically along with Dakin's moistened gauze packing which has been done at home now and seems to be doing very well. She was at [facility] skilled nursing facility for time. She did have Levaquin previous which she did do very well for her as far as get the infection under control in my opinion I think she is actually seeming to do quite well at this point. She is not having any pain which is also excellent news at this time. She is starting to get around more so she is not completely bedbound although she is a little weak she does have family that is very attentive and taking great care of her. 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 There is an area of bone that is very thinly covered. Been using Dakin's wet-to-dry dressings. She has in-home caregivers 24/7 and also home health coming out to change dressings/participate in dressing supervision although most of her dressing changes are being done by her own in-home caregivers. 11-04-2022 upon evaluation today patient appears to be doing well currently in regard to her wound. With that being said I do feel like she is making really good progress towards healing currently. I do not see any evidence of active infection locally nor systemically which is excellent news as well. No fevers, chills, nausea, vomiting, or diarrhea. 11-12-2022 patient's wound today appears to be doing okay although to be honest I am a little concerned about the  fact that the wound VAC was not put on properly. It was not bridged she was laying on the tubing and this course was not very comfortable for her. Fortunately I do not see any signs of active infection locally or systemically which is great news. No fevers, chills, nausea, vomiting, or diarrhea. 11-18-2022 upon evaluation today patient appears to be doing well currently in regard to her wound. This is actually showing signs of improvement wound VAC was put on much more eloquently this time compared to last I am actually very pleased. He does not like there may be some irritation around the edges of the wound I think this may be subsequent to an issue going on with infection. We can look into trying to determine what this may be and then adjusting medications as needed to take care of that infection if so. 11-25-2022 upon evaluation today patient appears to be doing well currently in regard to her wound which is actually showing signs of improvement feeling little by little. The irritation around the edges of the wound is better and the wound VAC seems to have been doing well until he ran out of the DuoDERM and apparently last time this was put on the DuoDERM was not utilized. Again I think that is something that has been extremely beneficial for keeping this wound VAC in place without any complications in the past. 8/26; patient comes in with home  health changing the wound VAC 3 times a week Monday Wednesday and Friday. Per our intake nurse the tunneling area superiorly has close down nicely. Progress has been good. She did not come in with a wound VAC on, they apparently took it off so she could be a but home health is coming in later today to change it. There is some concerns about next Monday being a holiday with regards to wound VAC change by home health 12-16-2022 upon evaluation today patient appears to be doing well currently in regard to her wound. She has been tolerating the dressing changes  without complication. Fortunately there does not appear to be any signs of active infection locally or systemically which is great news. No fevers, chills, nausea, vomiting, or diarrhea. 12-23-2022 upon evaluation today patient appears to be doing well currently in regard to her sacral wound. This is actually showing signs of significant improvement. Fortunately I do not see any evidence of worsening this has filled in quite nicely and really there is no significant depth to the wound is pretty much flush. I do believe that she may benefit from discontinuation of the VAC on the right least. On hold this week and see where things stand she is in agreement with that plan. 12-30-2022 upon evaluation today patient appears to be doing well currently in regard to her wound which is actually showing signs of excellent improvement. Fortunately I do not see any evidence of worsening overall and I do believe that the patient is making great headway towards complete closure. 01-06-2023 upon evaluation today patient appears to be doing well currently in regard to her wound on the sacral area this is actually showing signs of excellent improvement and actually very pleased with where we stand. Fortunately I do not see any signs of worsening in general. 01-13-2023 upon evaluation today patient appears to be doing excellent in regards to her wound which is actually showing signs of great improvement. The hypergranulation is greatly improved compared to last week after the silver nitrate that there is a little bit of necrotic tissue to remove that something I think I can do today without any significant complication. She is in agreement with that plan. 01-20-2023 upon evaluation today patient actually appears to be making good headway towards closure. Fortunately I do not see any signs of active infection at this time which is great news. No fevers, chills, nausea, vomiting, or diarrhea. [name], [name] ([id number])  [id number] Page 5 of 6 02-03-2023 upon evaluation today patient's wound actually appears to be doing about the same as last time I saw her. Upon discussion with the family it was apparent that the patient may not be keeping the dressing on all the time. On the days when the aide is there she does make sure that is in place but on other days it is uncertain whether or not this is actually being kept in place and it sounds like the answer may be no. Nonetheless I think that in general she seems to be making some headway here towards closure. 02-17-2023 upon evaluation today patient's wound does not appear to be bigger in size overall but unfortunately does have some bruising around the edges of the wound which is unfortunate. Fortunately I do not see any signs of active infection locally or systemically which is great news. No fevers, chills, nausea, vomiting, or diarrhea. Objective Constitutional Well-nourished and well-hydrated in no acute distress. Vitals Time Taken: 12:40 PM, Temperature: 98.2 F, Pulse: 66 bpm, Respiratory  Rate: 18 breaths/min, Blood Pressure: 149/55 mmHg. Respiratory normal breathing without difficulty. Psychiatric this patient is able to make decisions and demonstrates good insight into disease process. Alert and Oriented x 3. pleasant and cooperative. General Notes: Upon inspection patient's wound bed actually showed signs of good granulation in the center part of the wound unfortunately there is some bruising around the edges especially on the left side and this is evidence of excessive pressure occurring at the site. Fortunately I think this can be arrested quickly so that we do not end up having a significant issue here. Unfortunately I think that based on what I am seeing we are going to have some issues here with potential for breakdown around the edges of the wound we will see how things go over the next few weeks. Integumentary (Hair,  Skin) Wound #1 status is Open. Original cause of wound was Pressure Injury. The date acquired was: 08/30/2022. The wound has been in treatment 17 weeks. The wound is located on the Midline Coccyx. The wound measures 2cm length x 0.3cm width x 0.2cm depth; 0.471cm^2 area and 0.094cm^3 volume. There is bone and Fat Layer (Subcutaneous Tissue) exposed. There is a medium amount of serosanguineous drainage noted. There is medium (34-66%) red, pink granulation within the wound bed. There is a medium (34-66%) amount of necrotic tissue within the wound bed. Assessment Active Problems ICD-10 Pressure ulcer of sacral region, stage 4 Muscle weakness (generalized) Multiple myeloma not having achieved remission Plan 1. I did have a fairly extensive conversation with the patient about offloading and what she needs to do to try to prevent this from getting worse. She tells me that she has been trying to do which she can stay off of this and I know she has. With that being said I do believe that we need to be more diligent in that regard due to what we are seeing physically at this point. 2. I am going to recommend as well that the patient should continue to monitor for any signs of infection or worsening. Based on what I am seeing I do believe that we will making pretty good headway here towards closure however. I think if we can keep pressure off this will continue to show signs of improvement. We will see patient back for reevaluation in 1 week here in the clinic. If anything worsens or changes patient will contact our office for additional recommendations. Electronic Signature(s) Signed: 02/17/2023 1:06:23 PM By: Allen Derry PA-C Entered By: Allen Derry on 02/17/2023 13:06:23 [name], [name] ([id number]) [id number] Page 6 of 6 -------------------------------------------------------------------------------- SuperBill Details Patient Name: Date of Service: [name], [name]  02/17/2023 Medical Record Number: [id number] Patient Account Number: [id number] Date of Birth/Sex: Treating RN: 11/03/1940 (82 y.o. [name] Primary Care Provider: Hillard Danker Other Clinician: Referring Provider: Treating Provider/Extender: Madelynn Done Weeks in Treatment: 17 Diagnosis Coding ICD-10 Codes Code Description L89.154 Pressure ulcer of sacral region, stage 4 M62.81 Muscle weakness (generalized) C90.00 Multiple myeloma not having achieved remission Physician Procedures : CPT4 Code Description Modifier [id number] - WC PHYS LEVEL 3 - EST PT ICD-10 Diagnosis Description L89.154 Pressure ulcer of sacral region, stage 4 M62.81 Muscle weakness (generalized) C90.00 Multiple myeloma not having achieved remission Quantity: 1 Electronic Signature(s) Signed: 02/17/2023 1:13:51 PM By: Allen Derry PA-C Entered By: Allen Derry on 02/17/2023 13:13:51

## 2023-02-18 ENCOUNTER — Other Ambulatory Visit: Payer: Self-pay | Admitting: Medical Oncology

## 2023-02-18 DIAGNOSIS — C9000 Multiple myeloma not having achieved remission: Secondary | ICD-10-CM

## 2023-02-18 MED ORDER — OMEPRAZOLE 20 MG PO CPDR: 20.0000 mg | DELAYED_RELEASE_CAPSULE | Freq: Every day | ORAL | 3 refills | Status: DC

## 2023-02-18 NOTE — Telephone Encounter (Signed)
I told [name] that [facility] reviewed CXR with Cassie and he said to keep CT appt as scheduled and  Arbutus Ped will review with pt .

## 2023-02-19 ENCOUNTER — Other Ambulatory Visit: Payer: Self-pay | Admitting: Medical Oncology

## 2023-02-19 ENCOUNTER — Inpatient Hospital Stay: Payer: Medicare PPO

## 2023-02-19 ENCOUNTER — Inpatient Hospital Stay: Payer: Medicare PPO | Admitting: Physician Assistant

## 2023-02-19 ENCOUNTER — Other Ambulatory Visit: Payer: Self-pay | Admitting: Physician Assistant

## 2023-02-19 ENCOUNTER — Telehealth: Payer: Self-pay | Admitting: Medical Oncology

## 2023-02-19 ENCOUNTER — Inpatient Hospital Stay: Payer: Medicare PPO | Attending: Internal Medicine

## 2023-02-19 ENCOUNTER — Other Ambulatory Visit: Payer: Self-pay

## 2023-02-19 VITALS — BP 162/50 | HR 59 | Temp 97.7°F | Resp 16

## 2023-02-19 DIAGNOSIS — Z85238 Personal history of other malignant neoplasm of thymus: Secondary | ICD-10-CM | POA: Insufficient documentation

## 2023-02-19 DIAGNOSIS — Z5112 Encounter for antineoplastic immunotherapy: Secondary | ICD-10-CM | POA: Diagnosis not present

## 2023-02-19 DIAGNOSIS — C9000 Multiple myeloma not having achieved remission: Secondary | ICD-10-CM | POA: Diagnosis not present

## 2023-02-19 DIAGNOSIS — Z7961 Long term (current) use of immunomodulator: Secondary | ICD-10-CM | POA: Diagnosis not present

## 2023-02-19 DIAGNOSIS — R197 Diarrhea, unspecified: Secondary | ICD-10-CM

## 2023-02-19 DIAGNOSIS — Z79899 Other long term (current) drug therapy: Secondary | ICD-10-CM | POA: Diagnosis not present

## 2023-02-19 DIAGNOSIS — E876 Hypokalemia: Secondary | ICD-10-CM | POA: Insufficient documentation

## 2023-02-19 DIAGNOSIS — Z79624 Long term (current) use of inhibitors of nucleotide synthesis: Secondary | ICD-10-CM | POA: Insufficient documentation

## 2023-02-19 LAB — CMP (CANCER CENTER ONLY)
ALT: 21 U/L (ref 0–44)
AST: 17 U/L (ref 15–41)
Albumin: 3.7 g/dL (ref 3.5–5.0)
Alkaline Phosphatase: 164 U/L — ABNORMAL HIGH (ref 38–126)
Anion gap: 6 (ref 5–15)
BUN: 17 mg/dL (ref 8–23)
CO2: 33 mmol/L — ABNORMAL HIGH (ref 22–32)
Calcium: 8.9 mg/dL (ref 8.9–10.3)
Chloride: 105 mmol/L (ref 98–111)
Creatinine: 0.74 mg/dL (ref 0.44–1.00)
GFR, Estimated: >60 mL/min (ref >60)
Glucose, Bld: 115 mg/dL — ABNORMAL HIGH (ref 70–99)
Potassium: 2.6 mmol/L — CL (ref 3.5–5.1)
Sodium: 144 mmol/L (ref 135–145)
Total Bilirubin: 0.5 mg/dL (ref <1.2)
Total Protein: 5.6 g/dL — ABNORMAL LOW (ref 6.5–8.1)

## 2023-02-19 LAB — CBC WITH DIFFERENTIAL (CANCER CENTER ONLY)
Abs Immature Granulocytes: 0.05 10*3/uL (ref 0.00–0.07)
Basophils Absolute: 0.1 10*3/uL (ref 0.0–0.1)
Basophils Relative: 1 %
Eosinophils Absolute: 0.3 10*3/uL (ref 0.0–0.5)
Eosinophils Relative: 2 %
HCT: 36.9 % (ref 36.0–46.0)
Hemoglobin: 12.1 g/dL (ref 12.0–15.0)
Immature Granulocytes: 0 %
Lymphocytes Relative: 19 %
Lymphs Abs: 2.1 10*3/uL (ref 0.7–4.0)
MCH: 33.1 pg (ref 26.0–34.0)
MCHC: 32.8 g/dL (ref 30.0–36.0)
MCV: 100.8 fL — ABNORMAL HIGH (ref 80.0–100.0)
Monocytes Absolute: 1.6 10*3/uL — ABNORMAL HIGH (ref 0.1–1.0)
Monocytes Relative: 14 %
Neutro Abs: 7.3 10*3/uL (ref 1.7–7.7)
Neutrophils Relative %: 64 %
Platelet Count: 193 10*3/uL (ref 150–400)
RBC: 3.66 MIL/uL — ABNORMAL LOW (ref 3.87–5.11)
RDW: 14.4 % (ref 11.5–15.5)
WBC Count: 11.5 10*3/uL — ABNORMAL HIGH (ref 4.0–10.5)
nRBC: 0 % (ref 0.0–0.2)

## 2023-02-19 LAB — LACTATE DEHYDROGENASE: LDH: 170 U/L (ref 98–192)

## 2023-02-19 MED ORDER — DIPHENOXYLATE-ATROPINE 2.5-0.025 MG PO TABS: 1.0000 | ORAL_TABLET | Freq: Four times a day (QID) | ORAL | 0 refills | Status: DC | PRN

## 2023-02-19 MED ORDER — ZOLEDRONIC ACID 4 MG/100ML IV SOLN
4.0000 mg | Freq: Once | INTRAVENOUS | Status: AC
  Administered 2023-02-19: 4 mg via INTRAVENOUS
  Filled 2023-02-19: qty 100

## 2023-02-19 MED ORDER — DEXAMETHASONE 4 MG PO TABS
20.0000 mg | ORAL_TABLET | Freq: Once | ORAL | Status: AC
Start: 2023-02-19 — End: 2023-02-19
  Administered 2023-02-19: 20 mg via ORAL
  Filled 2023-02-19: qty 5

## 2023-02-19 MED ORDER — POTASSIUM CHLORIDE CRYS ER 20 MEQ PO TBCR: 20.0000 meq | EXTENDED_RELEASE_TABLET | Freq: Two times a day (BID) | ORAL | 0 refills | Status: DC

## 2023-02-19 MED ORDER — SODIUM CHLORIDE 0.9 % IV SOLN: Freq: Once | INTRAVENOUS | Status: AC

## 2023-02-19 MED ORDER — DIPHENHYDRAMINE HCL 25 MG PO CAPS
50.0000 mg | ORAL_CAPSULE | Freq: Once | ORAL | Status: AC
  Administered 2023-02-19: 50 mg via ORAL
  Filled 2023-02-19: qty 2

## 2023-02-19 MED ORDER — ACETAMINOPHEN 325 MG PO TABS
650.0000 mg | ORAL_TABLET | Freq: Once | ORAL | Status: AC
Start: 2023-02-19 — End: 2023-02-19
  Administered 2023-02-19: 650 mg via ORAL
  Filled 2023-02-19: qty 2

## 2023-02-19 MED ORDER — DARATUMUMAB-HYALURONIDASE-FIHJ 1800-30000 MG-UT/15ML ~~LOC~~ SOLN
1800.0000 mg | Freq: Once | SUBCUTANEOUS | Status: AC
  Administered 2023-02-19: 1800 mg via SUBCUTANEOUS
  Filled 2023-02-19: qty 15

## 2023-02-19 MED ORDER — LOSARTAN POTASSIUM 25 MG PO TABS: 25.0000 mg | ORAL_TABLET | Freq: Every day | ORAL | 0 refills | Status: DC

## 2023-02-19 MED ORDER — POTASSIUM CHLORIDE 10 MEQ/100ML IV SOLN
10.0000 meq | INTRAVENOUS | Status: AC
  Administered 2023-02-19 (×2): 10 meq via INTRAVENOUS
  Filled 2023-02-19: qty 100

## 2023-02-19 NOTE — Telephone Encounter (Signed)
CRITICAL VALUE STICKER  CRITICAL VALUE: K+ = 2.6  RECEIVER (on-site recipient of call):Cannen Dupras  DATE & TIME NOTIFIED: 02/19/2023 @ 1209  MESSENGER (representative from lab):HEATHER  MD NOTIFIED: MOHAMED  TIME OF NOTIFICATION:02/19/2023 @ 1210  RESPONSE:  Oral potassium ordered for 40 MEQ every day  10 days  IV K+ ordered for today

## 2023-02-19 NOTE — Patient Instructions (Signed)
[facility] - A DEPT OF [facility]  Discharge Instructions: Thank you for choosing [facility] to provide your oncology and hematology care.   If you have a lab appointment with the Cancer Center, please go directly to the Cancer Center and check in at the registration area.   Wear comfortable clothing and clothing appropriate for easy access to any Portacath or PICC line.   We strive to give you quality time with your provider. You may need to reschedule your appointment if you arrive late (15 or more minutes).  Arriving late affects you and other patients whose appointments are after yours.  Also, if you miss three or more appointments without notifying the office, you may be dismissed from the clinic at the provider's discretion.      For prescription refill requests, have your pharmacy contact our office and allow 72 hours for refills to be completed.    Today you received the following chemotherapy and/or immunotherapy agents: Darzalex Faspro      To help prevent nausea and vomiting after your treatment, we encourage you to take your nausea medication as directed.  BELOW ARE SYMPTOMS THAT SHOULD BE REPORTED IMMEDIATELY: *FEVER GREATER THAN 100.4 F (38 C) OR HIGHER *CHILLS OR SWEATING *NAUSEA AND VOMITING THAT IS NOT CONTROLLED WITH YOUR NAUSEA MEDICATION *UNUSUAL SHORTNESS OF BREATH *UNUSUAL BRUISING OR BLEEDING *URINARY PROBLEMS (pain or burning when urinating, or frequent urination) *BOWEL PROBLEMS (unusual diarrhea, constipation, pain near the anus) TENDERNESS IN MOUTH AND THROAT WITH OR WITHOUT PRESENCE OF ULCERS (sore throat, sores in mouth, or a toothache) UNUSUAL RASH, SWELLING OR PAIN  UNUSUAL VAGINAL DISCHARGE OR ITCHING   Items with * indicate a potential emergency and should be followed up as soon as possible or go to the Emergency Department if any problems should occur.  Please show the CHEMOTHERAPY ALERT CARD or  IMMUNOTHERAPY ALERT CARD at check-in to the Emergency Department and triage nurse.  Should you have questions after your visit or need to cancel or reschedule your appointment, please contact [facility] - A DEPT OF [facility]  Dept: [phone]  and follow the prompts.  Office hours are 8:00 a.m. to 4:30 p.m. Monday - Friday. Please note that voicemails left after 4:00 p.m. may not be returned until the following business day.  We are closed weekends and major holidays. You have access to a nurse at all times for urgent questions. Please call the main number to the clinic Dept: [phone] and follow the prompts.   For any non-urgent questions, you may also contact your provider using MyChart. We now offer e-Visits for anyone 80 and older to request care online for non-urgent symptoms. For details visit [email or web address].   Also download the MyChart app! Go to the app store, search "MyChart", open the app, select , and log in with your MyChart username and password.  Zoledronic Acid Injection (Cancer) What is this medication? ZOLEDRONIC ACID (ZOE le dron ik AS id) treats high calcium levels in the blood caused by cancer. It may also be used with chemotherapy to treat weakened bones caused by cancer. It works by slowing down the release of calcium from bones. This lowers calcium levels in your blood. It also makes your bones stronger and less likely to break (fracture). It belongs to a group of medications called bisphosphonates. This medicine may be used for other purposes; ask your health care provider or pharmacist if  you have questions. COMMON BRAND NAME(S): Zometa, Zometa Powder What should I tell my care team before I take this medication? They need to know if you have any of these conditions: Dehydration Dental disease Kidney disease Liver disease Low levels of calcium in the blood Lung or breathing disease, such as asthma Receiving  steroids, such as dexamethasone or prednisone An unusual or allergic reaction to zoledronic acid, other medications, foods, dyes, or preservatives Pregnant or trying to get pregnant Breast-feeding How should I use this medication? This medication is injected into a vein. It is given by your care team in a hospital or clinic setting. Talk to your care team about the use of this medication in children. Special care may be needed. Overdosage: If you think you have taken too much of this medicine contact a poison control center or emergency room at once. NOTE: This medicine is only for you. Do not share this medicine with others. What if I miss a dose? Keep appointments for follow-up doses. It is important not to miss your dose. Call your care team if you are unable to keep an appointment. What may interact with this medication? Certain antibiotics given by injection Diuretics, such as bumetanide, furosemide NSAIDs, medications for pain and inflammation, such as ibuprofen or naproxen Teriparatide Thalidomide This list may not describe all possible interactions. Give your health care provider a list of all the medicines, herbs, non-prescription drugs, or dietary supplements you use. Also tell them if you smoke, drink alcohol, or use illegal drugs. Some items may interact with your medicine. What should I watch for while using this medication? Visit your care team for regular checks on your progress. It may be some time before you see the benefit from this medication. Some people who take this medication have severe bone, joint, or muscle pain. This medication may also increase your risk for jaw problems or a broken thigh bone. Tell your care team right away if you have severe pain in your jaw, bones, joints, or muscles. Tell you care team if you have any pain that does not go away or that gets worse. Tell your dentist and dental surgeon that you are taking this medication. You should not have major  dental surgery while on this medication. See your dentist to have a dental exam and fix any dental problems before starting this medication. Take good care of your teeth while on this medication. Make sure you see your dentist for regular follow-up appointments. You should make sure you get enough calcium and vitamin D while you are taking this medication. Discuss the foods you eat and the vitamins you take with your care team. Check with your care team if you have severe diarrhea, nausea, and vomiting, or if you sweat a lot. The loss of too much body fluid may make it dangerous for you to take this medication. You may need bloodwork while taking this medication. Talk to your care team if you wish to become pregnant or think you might be pregnant. This medication can cause serious birth defects. What side effects may I notice from receiving this medication? Side effects that you should report to your care team as soon as possible: Allergic reactions--skin rash, itching, hives, swelling of the face, lips, tongue, or throat Kidney injury--decrease in the amount of urine, swelling of the ankles, hands, or feet Low calcium level--muscle pain or cramps, confusion, tingling, or numbness in the hands or feet Osteonecrosis of the jaw--pain, swelling, or redness in the mouth,  numbness of the jaw, poor healing after dental work, unusual discharge from the mouth, visible bones in the mouth Severe bone, joint, or muscle pain Side effects that usually do not require medical attention (report to your care team if they continue or are bothersome): Constipation Fatigue Fever Loss of appetite Nausea Stomach pain This list may not describe all possible side effects. Call your doctor for medical advice about side effects. You may report side effects to FDA at 1-800-FDA-1088. Where should I keep my medication? This medication is given in a hospital or clinic. It will not be stored at home. NOTE: This sheet is a  summary. It may not cover all possible information. If you have questions about this medicine, talk to your doctor, pharmacist, or health care provider.  2024 Elsevier/Gold Standard (2021-05-18 00:00:00)  Potassium Chloride Injection What is this medication? POTASSIUM CHLORIDE (poe TASS i um KLOOR ide) prevents and treats low levels of potassium in your body. Potassium plays an important role in maintaining the health of your kidneys, heart, muscles, and nervous system. This medicine may be used for other purposes; ask your health care provider or pharmacist if you have questions. COMMON BRAND NAME(S): PROAMP What should I tell my care team before I take this medication? They need to know if you have any of these conditions: Addison disease Dehydration Diabetes (high blood sugar) Heart disease High levels of potassium in the blood Irregular heartbeat or rhythm Kidney disease Large areas of burned skin An unusual or allergic reaction to potassium, other medications, foods, dyes, or preservatives Pregnant or trying to get pregnant Breast-feeding How should I use this medication? This medication is injected into a vein. It is given in a hospital or clinic setting. Talk to your care team about the use of this medication in children. Special care may be needed. Overdosage: If you think you have taken too much of this medicine contact a poison control center or emergency room at once. NOTE: This medicine is only for you. Do not share this medicine with others. What if I miss a dose? This does not apply. This medication is not for regular use. What may interact with this medication? Do not take this medication with any of the following: Certain diuretics, such as spironolactone, triamterene Eplerenone Sodium polystyrene sulfonate This medication may also interact with the following: Certain medications for blood pressure or heart disease, such as lisinopril, losartan, quinapril,  valsartan Medications that lower your chance of fighting infection, such as cyclosporine, tacrolimus NSAIDs, medications for pain and inflammation, such as ibuprofen or naproxen Other potassium supplements Salt substitutes This list may not describe all possible interactions. Give your health care provider a list of all the medicines, herbs, non-prescription drugs, or dietary supplements you use. Also tell them if you smoke, drink alcohol, or use illegal drugs. Some items may interact with your medicine. What should I watch for while using this medication? Visit your care team for regular checks on your progress. Tell your care team if your symptoms do not start to get better or if they get worse. You may need blood work while you are taking this medication. Avoid salt substitutes unless you are told otherwise by your care team. What side effects may I notice from receiving this medication? Side effects that you should report to your care team as soon as possible: Allergic reactions--skin rash, itching, hives, swelling of the face, lips, tongue, or throat High potassium level--muscle weakness, fast or irregular heartbeat Side effects that usually  do not require medical attention (report to your care team if they continue or are bothersome): Diarrhea Nausea Stomach pain Vomiting This list may not describe all possible side effects. Call your doctor for medical advice about side effects. You may report side effects to FDA at 1-800-FDA-1088. Where should I keep my medication? This medication is given in a hospital or clinic. It will not be stored at home. NOTE: This sheet is a summary. It may not cover all possible information. If you have questions about this medicine, talk to your doctor, pharmacist, or health care provider.  2024 Elsevier/Gold Standard (2021-10-05 00:00:00)  DIARRHEA Follow these instructions at home: Eating and drinking     Follow these instructions as told by your  doctor: Take an ORS (oral rehydration solution). This is a drink that helps you replace fluids and minerals your body lost. It is sold at pharmacies and stores. Drink enough fluid to keep your pee (urine) pale yellow. Drink fluids such as: Water. You can also get fluids by sucking on ice chips. Diluted fruit juice. Low-calorie sports drinks. Milk. Avoid drinking fluids that have a lot of sugar or caffeine in them. These include soda, energy drinks, and regular sports drinks. Avoid alcohol. Eat bland, easy-to-digest foods in small amounts as you are able. These foods include: Bananas. Applesauce. Rice. Low-fat (lean) meats. Toast. Crackers. Avoid spicy or fatty foods.

## 2023-02-19 NOTE — Telephone Encounter (Signed)
Call to patient to advise that Dr. Mayford Knife recommends she start Losartan 25mg  daily and repeat BMET in 1 week. Patient also agrees to check her BP daily for a week and call with results.

## 2023-02-19 NOTE — Progress Notes (Unsigned)
Symptom Management Consult Note [facility]    Patient Care Team: [name], MD as PCP - General (Internal Medicine) Quintella Reichert, MD as PCP - Cardiology (Cardiology) Quintella Reichert, MD as Consulting Physician (Cardiology)    Name / MRN / DOB: [name]  [id number]  1940-07-25   Date of visit: 02/20/2023   Chief Complaint/Reason for visit: recheck labs   Current Therapy: Revlimid, Darzalex, Decadron, Zometa  Last treatment:  Day 15   Cycle 6 on 02/19/23   ASSESSMENT & PLAN: Patient is a 82 y.o. female with oncologic history of multiple myeloma followed by Dr. Arbutus Ped.  I have viewed most recent oncology note and lab work.    #Multiple myeloma - Next appointment with oncologist is 03/03/23   #Diarrhea Intermittent diarrhea with recent exacerbation, currently under control. Potassium improved from 2.6 mmol/L to 3.3 mmol/L after IV and oral supplementation yesterday. No diarrhea since yesterday. Lomotil prescribed for as-needed use if Imodium is ineffective.  - Continue potassium pills -Magnesium is WNL. No need for supplementation - Use Imodium first for diarrhea management - Use Lomotil if Imodium is ineffective -Emphasized monitoring for symptoms of hypokalemia (weakness, fatigue) and checking potassium levels if symptomatic.    Strict ED precautions discussed should symptoms worsen.   Heme/Onc History: Oncology History  Type AB thymoma (HCC)  03/05/2022 Initial Diagnosis   Type AB thymoma (HCC)   03/05/2022 Cancer Staging   Staging form: Thymus, AJCC 8th Edition - Pathologic stage from 03/05/2022: No Stage Recommended (pT1a, cN0, cM0) - Signed by Lonie Peak, MD on 03/05/2022 Stage prefix: Initial diagnosis Residual tumor (R): R1 - Microscopic   Multiple myeloma (HCC)  08/27/2022 Initial Diagnosis   Multiple myeloma (HCC)   09/06/2022 - 09/06/2022 Chemotherapy   Patient is on Treatment Plan : MYELOMA NEWLY DIAGNOSED Daratumumab  IV + Lenalidomide + Dexamethasone Weekly (DaraRd) q28d     09/11/2022 -  Chemotherapy   Patient is on Treatment Plan : MYELOMA NEWLY DIAGNOSED Daratumumab SQ + Lenalidomide + Dexamethasone Weekly (DaraRd) q28d     09/11/2022 Cancer Staging   Staging form: Plasma Cell Myeloma and Plasma Cell Disorders, AJCC 8th Edition - Clinical stage from 09/11/2022: RISS Stage II (Beta-2-microglobulin (mg/L): 8.7, Albumin (g/dL): 2.3, ISS: Stage III, High-risk cytogenetics: Absent, LDH: Normal) - Signed by Si Gaul, MD on 09/11/2022 Stage prefix: Initial diagnosis Beta 2 microglobulin range (mg/L): Greater than or equal to 5.5 Albumin range (g/dL): Less than 3.5 Cytogenetics: Other mutation Serum calcium level: Normal Serum creatinine level: Elevated Bone disease on imaging: Present       Interval history-: [name] is a 82 y.o. female with oncologic history as above presenting to [facility] today with chief complaint of recheck labs. She is accompanied by son who provides additional history.  Patient was found to have low potassium yesterday and received IV potassium in the infusion center and prescription for PO potassium.  The patient reported no further episodes of diarrhea since the previous day and had started taking prescribed Lomotil and potassium supplements. She denies any abdominal pain. Son reported that the patient had been cautious about taking Imodium due to a previous episode of constipation after its use. The patient had also been prescribed Colestid by a gastroenterologist for inconsistent bowel movements, but no significant improvement was noted. He adds that patient is starting a new medication for her hypertension and will follow closely with cardiologist for this.   ROS  All other  systems are reviewed and are negative for acute change except as noted in the HPI.    Allergies  Allergen Reactions   Crestor [Rosuvastatin Calcium] Other (See Comments)    Myalgias with 5mg  daily  dose   Lactose Other (See Comments)   Lipitor [Atorvastatin] Other (See Comments)    Stomach pain   Pravastatin Other (See Comments)    Myalgias    Simvastatin Other (See Comments)    Myalgias    Tramadol Other (See Comments)    FEELS LOOPY   Zetia [Ezetimibe]     Myalgias   Codeine Palpitations   Penicillins Palpitations   Sulfa Antibiotics Palpitations   Vancomycin Itching and Other (See Comments)    Pt developed flushing and itchy scalp greater than into the infusion.  RedMan syndrone developed.  Will need slower admin rate for future infusions.     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 by echo 10/2021   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/2015   Carotid artery stenosis    < 50% stenosis bilaterally by doppler 07/2016   Coronary artery disease 2008   Coronary Ca score of 331 with minimal multivessel plaque < 25% stenosis by coronary CTA 8/23   Heart murmur    per pt   Hypercholesteremia    Hypertension    Obesity    Osteopenia    declines treatment   Pneumonia 1995   Pulmonary HTN (HCC)    mild to moderate by echo 7/23 with PASP   Shoulder pain    Due to arthritis   Vitamin D insufficiency      Past Surgical History:  Procedure Laterality Date   AORTIC VALVE REPLACEMENT N/A 10/14/2013   Procedure: AORTIC VALVE REPLACEMENT (AVR);  Surgeon: Alleen Borne, MD;  Location: [facility] OR;  Service: Open Heart Surgery;  Laterality: N/A;   CARDIAC CATHETERIZATION     CATARACT EXTRACTION, BILATERAL     CHOLECYSTECTOMY  04/08/1988   ESOPHAGOGASTRODUODENOSCOPY (EGD) WITH PROPOFOL N/A 09/05/2022   Procedure: ESOPHAGOGASTRODUODENOSCOPY (EGD) WITH PROPOFOL;  Surgeon: Kathi Der, MD;  Location: [facility] ENDOSCOPY;  Service:  Gastroenterology;  Laterality: N/A;   INTRAOPERATIVE TRANSESOPHAGEAL ECHOCARDIOGRAM N/A 10/14/2013   Procedure: INTRAOPERATIVE TRANSESOPHAGEAL ECHOCARDIOGRAM;  Surgeon: Alleen Borne, MD;  Location: [facility] OR;  Service: Open Heart Surgery;  Laterality: N/A;   IVC FILTER INSERTION N/A 09/06/2022   Procedure: IVC FILTER INSERTION;  Surgeon: Leonie Douglas, MD;  Location: [facility] INVASIVE CV LAB;  Service: Cardiovascular;  Laterality: N/A;   KYPHOPLASTY Bilateral 03/27/2022   Procedure: KYPHOPLASTY AND BIOPSY THORACIC ELEVEN;  Surgeon: Lisbeth Renshaw, MD;  Location: [facility] OR;  Service: Neurosurgery;  Laterality: Bilateral;   LEFT AND RIGHT HEART CATHETERIZATION WITH CORONARY ANGIOGRAM N/A 09/16/2013   Procedure: LEFT AND RIGHT HEART CATHETERIZATION WITH CORONARY ANGIOGRAM;  Surgeon: Quintella Reichert, MD;  Location: [facility] CATH LAB;  Service: Cardiovascular;  Laterality: N/A;   TONSILLECTOMY      Social History   Socioeconomic History   Marital status: Widowed    Spouse name: Not on file   Number of children: Not on file   Years of education: Not on file   Highest education level: Not on file  Occupational History   Not on file  Tobacco Use   Smoking status: Never   Smokeless tobacco: Never  Vaping Use   Vaping status: Never Used  Substance and Sexual Activity   Alcohol use: No   Drug use: No   Sexual activity: Not Currently  Other Topics Concern   Not on file  Social History Narrative   Not on file   Social Determinants of Health   Financial Resource Strain: Not on file  Food Insecurity: No Food Insecurity (08/31/2022)   Hunger Vital Sign    Worried About Running Out of Food in the Last Year: Never true    Ran Out of Food in the Last Year: Never true  Transportation Needs: No Transportation Needs (08/31/2022)   PRAPARE - Administrator, Civil Service (Medical): No    Lack of Transportation (Non-Medical): No  Physical Activity: Not on file  Stress: Not on file  Social  Connections: Not on file  Intimate Partner Violence: Not At Risk (08/31/2022)   Humiliation, Afraid, Rape, and Kick questionnaire    Fear of Current or Ex-Partner: No    Emotionally Abused: No    Physically Abused: No    Sexually Abused: No    Family History  Problem Relation Age of Onset   Dementia Mother    Heart disease Father    Heart attack Father    Heart disease Brother    Heart attack Brother    Arrhythmia Sister    Heart attack Maternal Grandfather      Current Outpatient Medications:    acetaminophen (TYLENOL) 325 MG tablet, Take 2 tablets (650 mg total) by mouth every 8 (eight) hours as needed., Disp: , Rfl:    acyclovir (ZOVIRAX) 200 MG capsule, Take 1 capsule (200 mg total) by mouth 2 (two) times daily., Disp: 30 capsule, Rfl: 0   allopurinol (ZYLOPRIM) 100 MG tablet, Take 1 tablet (100 mg total) by mouth 2 (two) times daily., Disp: 60 tablet, Rfl: 2   ascorbic acid (VITAMIN C) 500 MG tablet, Take 1 tablet (500 mg total) by mouth daily., Disp: , Rfl:    colestipol (COLESTID) 1 g tablet, Take 1 g by mouth daily., Disp: , Rfl:    daratumumab-hyaluronidase-fihj (DARZALEX FASPRO) 1800-30000 MG-UT/15ML SOLN, Inject 1,800 mg into the skin once., Disp: , Rfl:    dexamethasone (DECADRON) 4 MG tablet, Please take 5 tablets (20 mg) weekly on the days of treatment, Disp: 40 tablet, Rfl: 3   diphenoxylate-atropine (LOMOTIL) 2.5-0.025 MG tablet, Take 1 tablet by mouth 4 (four) times daily as needed for diarrhea or loose stools., Disp: 30 tablet, Rfl: 0   HYDROcodone-acetaminophen (NORCO/VICODIN) 5-325 MG tablet, Take 1 tablet by mouth every 6 (six) hours as needed for moderate pain., Disp: , Rfl:    lenalidomide (REVLIMID) 15 MG capsule, TAKE 1 (15MG ) CAPSULE BY MOUTH DAILY, Disp: 21 capsule, Rfl: 0   losartan (COZAAR) 25 MG tablet, Take 1 tablet (25 mg total) by mouth daily., Disp: 30 tablet, Rfl: 0   Multiple Vitamin (MULTIVITAMIN WITH MINERALS) TABS tablet, Take 1 tablet by mouth  daily., Disp: , Rfl:    naloxone (NARCAN) nasal spray 4 mg/0.1 mL, , Disp: , Rfl:    oxycodone (OXY-IR) 5 MG capsule, Take 5 mg by mouth every 6 (six) hours as needed for pain., Disp: , Rfl:    potassium chloride SA (KLOR-CON M) 20 MEQ tablet, Take 1 tablet (20 mEq total) by mouth 2 (two) times daily for  10 days., Disp: 20 tablet, Rfl: 0   sodium hypochlorite (DAKIN'S 1/4 STRENGTH) 0.125 % SOLN, 1 application Externally Once a day for 30 days, Disp: , Rfl:   PHYSICAL EXAM: ECOG FS:1 - Symptomatic but completely ambulatory    Vitals:   02/20/23 1058  BP: (!) 141/55  Pulse: (!) 55  Resp: 18  Temp: 98.5 F (36.9 C)  TempSrc: Oral  SpO2: 98%   Physical Exam Vitals and nursing note reviewed.  Constitutional:      Appearance: She is not ill-appearing or toxic-appearing.  HENT:     Head: Normocephalic.     Mouth/Throat:     Mouth: Mucous membranes are moist.  Eyes:     Conjunctiva/sclera: Conjunctivae normal.  Cardiovascular:     Rate and Rhythm: Bradycardia present.     Pulses: Normal pulses.  Pulmonary:     Effort: Pulmonary effort is normal.  Abdominal:     General: There is no distension.  Musculoskeletal:     Cervical back: Normal range of motion.  Skin:    General: Skin is warm and dry.  Neurological:     Mental Status: She is alert.        LABORATORY DATA: I have reviewed the data as listed    Latest Ref Rng & Units 02/20/2023   10:27 AM 02/19/2023   11:18 AM 02/04/2023    1:28 PM  CBC  WBC 4.0 - 10.5 K/uL 10.7  11.5  11.1   Hemoglobin 12.0 - 15.0 g/dL 16.1  09.6  04.5   Hematocrit 36.0 - 46.0 % 35.4  36.9  34.8   Platelets 150 - 400 K/uL 204  193  276         Latest Ref Rng & Units 02/20/2023   10:27 AM 02/19/2023   11:18 AM 01/22/2023    9:46 AM  CMP  Glucose 70 - 99 mg/dL 409  811  914   BUN 8 - 23 mg/dL 19  17  15    Creatinine 0.44 - 1.00 mg/dL 7.82  9.56  2.13   Sodium 135 - 145 mmol/L 145  144  141   Potassium 3.5 - 5.1 mmol/L 3.3  2.6   3.1   Chloride 98 - 111 mmol/L 109  105  104   CO2 22 - 32 mmol/L 29  33  26   Calcium 8.9 - 10.3 mg/dL 8.6  8.9  8.8   Total Protein 6.5 - 8.1 g/dL 5.6  5.6  5.6   Total Bilirubin <1.2 mg/dL 0.4  0.5  0.6   Alkaline Phos 38 - 126 U/L 156  164  134   AST 15 - 41 U/L 15  17  26    ALT 0 - 44 U/L 19  21  31         RADIOGRAPHIC STUDIES (from last 24 hours if applicable) I have personally reviewed the radiological images as listed and agreed with the findings in the report. No results found.      Visit Diagnosis: 1. Multiple myeloma, remission status unspecified (HCC)   2. Hypokalemia   3. Diarrhea, unspecified type      No orders of the defined types were placed in this encounter.   All questions were answered. The patient knows to call the clinic with any problems, questions or concerns. No barriers to learning was detected.  A total of more than 30 minutes were spent on this encounter with face-to-face time and non-face-to-face time, including preparing to see the  patient, ordering tests, counseling the patient and coordination of care as outlined above.    Thank you for allowing me to participate in the care of this patient.    Shanon Ace, PA-C Department of Hematology/Oncology [facility] at [facility] Phone: [phone]  [phone]    02/20/2023 4:47 PM

## 2023-02-19 NOTE — Progress Notes (Signed)
Pt presented for Darzalex Faspro. Pts CMP showing K 2.6. Pt endorses multiple episodes of diarrhea each day. She says it feels like each time she eats anything "it just runs right through". Pt given of IV K. Oral K prescription sent into preferred pharmacy. Pt and family endorse understanding of prescription instructions. Pt states she is taking over the counter Imodium for diarrhea with little effect. Jae Dire, PA-C notified. Plan for [facility] visit tomorrow 11/14 at 1030. Lab at 10. Pt/family aware and agreeable. ED precautions reviewed and verbalize understanding.

## 2023-02-19 NOTE — Addendum Note (Signed)
Addended by: Luellen Pucker on: 02/19/2023 05:53 PM   Modules accepted: Orders

## 2023-02-20 ENCOUNTER — Other Ambulatory Visit: Payer: Medicare PPO

## 2023-02-20 ENCOUNTER — Inpatient Hospital Stay: Payer: Medicare PPO

## 2023-02-20 ENCOUNTER — Inpatient Hospital Stay: Payer: Medicare PPO | Admitting: Physician Assistant

## 2023-02-20 VITALS — BP 141/55 | HR 55 | Temp 98.5°F | Resp 18

## 2023-02-20 DIAGNOSIS — Z85238 Personal history of other malignant neoplasm of thymus: Secondary | ICD-10-CM | POA: Diagnosis not present

## 2023-02-20 DIAGNOSIS — Z79624 Long term (current) use of inhibitors of nucleotide synthesis: Secondary | ICD-10-CM | POA: Diagnosis not present

## 2023-02-20 DIAGNOSIS — C9000 Multiple myeloma not having achieved remission: Secondary | ICD-10-CM

## 2023-02-20 DIAGNOSIS — Z7961 Long term (current) use of immunomodulator: Secondary | ICD-10-CM | POA: Diagnosis not present

## 2023-02-20 DIAGNOSIS — R197 Diarrhea, unspecified: Secondary | ICD-10-CM

## 2023-02-20 DIAGNOSIS — E876 Hypokalemia: Secondary | ICD-10-CM | POA: Diagnosis not present

## 2023-02-20 DIAGNOSIS — Z5112 Encounter for antineoplastic immunotherapy: Secondary | ICD-10-CM | POA: Diagnosis not present

## 2023-02-20 DIAGNOSIS — Z79899 Other long term (current) drug therapy: Secondary | ICD-10-CM | POA: Diagnosis not present

## 2023-02-20 LAB — CMP (CANCER CENTER ONLY)
ALT: 19 U/L (ref 0–44)
AST: 15 U/L (ref 15–41)
Albumin: 3.7 g/dL (ref 3.5–5.0)
Alkaline Phosphatase: 156 U/L — ABNORMAL HIGH (ref 38–126)
Anion gap: 7 (ref 5–15)
BUN: 19 mg/dL (ref 8–23)
CO2: 29 mmol/L (ref 22–32)
Calcium: 8.6 mg/dL — ABNORMAL LOW (ref 8.9–10.3)
Chloride: 109 mmol/L (ref 98–111)
Creatinine: 0.82 mg/dL (ref 0.44–1.00)
GFR, Estimated: >60 mL/min (ref >60)
Glucose, Bld: 128 mg/dL — ABNORMAL HIGH (ref 70–99)
Potassium: 3.3 mmol/L — ABNORMAL LOW (ref 3.5–5.1)
Sodium: 145 mmol/L (ref 135–145)
Total Bilirubin: 0.4 mg/dL (ref <1.2)
Total Protein: 5.6 g/dL — ABNORMAL LOW (ref 6.5–8.1)

## 2023-02-20 LAB — CBC WITH DIFFERENTIAL (CANCER CENTER ONLY)
Abs Immature Granulocytes: 0.07 10*3/uL (ref 0.00–0.07)
Basophils Absolute: 0 10*3/uL (ref 0.0–0.1)
Basophils Relative: 0 %
Eosinophils Absolute: 0 10*3/uL (ref 0.0–0.5)
Eosinophils Relative: 0 %
HCT: 35.4 % — ABNORMAL LOW (ref 36.0–46.0)
Hemoglobin: 11.7 g/dL — ABNORMAL LOW (ref 12.0–15.0)
Immature Granulocytes: 1 %
Lymphocytes Relative: 8 %
Lymphs Abs: 0.8 10*3/uL (ref 0.7–4.0)
MCH: 33.1 pg (ref 26.0–34.0)
MCHC: 33.1 g/dL (ref 30.0–36.0)
MCV: 100 fL (ref 80.0–100.0)
Monocytes Absolute: 1.4 10*3/uL — ABNORMAL HIGH (ref 0.1–1.0)
Monocytes Relative: 13 %
Neutro Abs: 8.4 10*3/uL — ABNORMAL HIGH (ref 1.7–7.7)
Neutrophils Relative %: 78 %
Platelet Count: 204 10*3/uL (ref 150–400)
RBC: 3.54 MIL/uL — ABNORMAL LOW (ref 3.87–5.11)
RDW: 14.3 % (ref 11.5–15.5)
WBC Count: 10.7 10*3/uL — ABNORMAL HIGH (ref 4.0–10.5)
nRBC: 0 % (ref 0.0–0.2)

## 2023-02-20 LAB — MAGNESIUM: Magnesium: 1.9 mg/dL (ref 1.7–2.4)

## 2023-02-23 ENCOUNTER — Other Ambulatory Visit: Payer: Self-pay | Admitting: Internal Medicine

## 2023-02-23 DIAGNOSIS — C9000 Multiple myeloma not having achieved remission: Secondary | ICD-10-CM

## 2023-02-24 ENCOUNTER — Ambulatory Visit (HOSPITAL_COMMUNITY)
Admission: RE | Admit: 2023-02-24 | Discharge: 2023-02-24 | Disposition: A | Discharge status: Home / Self Care | Payer: Medicare PPO | Source: Ambulatory Visit | Attending: Internal Medicine | Admitting: Internal Medicine

## 2023-02-24 ENCOUNTER — Other Ambulatory Visit: Payer: Self-pay | Admitting: Medical Oncology

## 2023-02-24 ENCOUNTER — Encounter: Payer: Self-pay | Admitting: Physician Assistant

## 2023-02-24 ENCOUNTER — Other Ambulatory Visit: Payer: Medicare PPO

## 2023-02-24 ENCOUNTER — Other Ambulatory Visit: Payer: Self-pay

## 2023-02-24 ENCOUNTER — Encounter: Payer: Self-pay | Admitting: Internal Medicine

## 2023-02-24 DIAGNOSIS — S2243XD Multiple fractures of ribs, bilateral, subsequent encounter for fracture with routine healing: Secondary | ICD-10-CM | POA: Diagnosis not present

## 2023-02-24 DIAGNOSIS — D4989 Neoplasm of unspecified behavior of other specified sites: Secondary | ICD-10-CM | POA: Diagnosis present

## 2023-02-24 DIAGNOSIS — C9000 Multiple myeloma not having achieved remission: Secondary | ICD-10-CM | POA: Diagnosis not present

## 2023-02-24 DIAGNOSIS — M8458XD Pathological fracture in neoplastic disease, other specified site, subsequent encounter for fracture with routine healing: Secondary | ICD-10-CM | POA: Diagnosis not present

## 2023-02-24 MED ORDER — LENALIDOMIDE 15 MG PO CAPS: 15.0000 mg | ORAL_CAPSULE | Freq: Every day | ORAL | 0 refills | Status: DC

## 2023-02-24 MED ORDER — IOHEXOL 300 MG/ML  SOLN
75.0000 mL | Freq: Once | INTRAMUSCULAR | Status: AC | PRN
  Administered 2023-02-24: 75 mL via INTRAVENOUS

## 2023-02-24 NOTE — Addendum Note (Signed)
Addended by: Charma Igo on: 02/24/2023 03:22 PM   Modules accepted: Orders

## 2023-02-25 ENCOUNTER — Encounter: Payer: Medicare PPO | Admitting: Physician Assistant

## 2023-02-25 DIAGNOSIS — C9000 Multiple myeloma not having achieved remission: Secondary | ICD-10-CM | POA: Diagnosis not present

## 2023-02-25 DIAGNOSIS — L89154 Pressure ulcer of sacral region, stage 4: Secondary | ICD-10-CM | POA: Diagnosis not present

## 2023-02-25 DIAGNOSIS — L89153 Pressure ulcer of sacral region, stage 3: Secondary | ICD-10-CM | POA: Diagnosis not present

## 2023-02-25 DIAGNOSIS — M6281 Muscle weakness (generalized): Secondary | ICD-10-CM | POA: Diagnosis not present

## 2023-02-25 NOTE — Progress Notes (Addendum)
[name], [name] ([id number]) [id number] Page 1 of 8 Visit Report for 02/25/2023 Chief Complaint Document Details Patient Name: Date of Service: [name] [name] 02/25/2023 12:15 PM Medical Record Number: [id number] Patient Account Number: [id number] Date of Birth/Sex: Treating RN: 04/25/1940 (82 y.o. [name] Primary Care Provider: Hillard Danker Other Clinician: Referring Provider: Treating Provider/Extender: Madelynn Done Weeks in Treatment: 18 Information Obtained from: Patient Chief Complaint Stage 4 pressure ulcer Electronic Signature(s) Signed: 02/25/2023 12:39:48 PM By: Allen Derry PA-C Entered By: Allen Derry on 02/25/2023 12:39:48 -------------------------------------------------------------------------------- Debridement Details Patient Name: Date of Service: [name] 02/25/2023 12:15 PM Medical Record Number: [id number] Patient Account Number: [id number] Date of Birth/Sex: Treating RN: 06-24-1940 (82 y.o. [name] Primary Care Provider: Hillard Danker Other Clinician: Referring Provider: Treating Provider/Extender: Madelynn Done Weeks in Treatment: 18 Debridement Performed for Assessment: Wound #1 Midline Coccyx Performed By: Physician Allen Derry, PA-C The following information was scribed by: Midge Aver The information was scribed for: Allen Derry Debridement Type: Debridement Level of Consciousness (Pre-procedure): Awake and Alert Pre-procedure Verification/Time Out Yes - 12:44 Taken: Start Time: 12:44 Pain Control: Lidocaine 4% T opical Solution Percent of Wound Bed Debrided: 100% T Area Debrided (cm): otal 0.47 Tissue and other material debrided: Viable, Non-Viable, Slough, Subcutaneous, Biofilm, Slough Level: Skin/Subcutaneous Tissue Debridement Description: Excisional Instrument: Curette Bleeding: Minimum Hemostasis Achieved: Pressure [name] ([id number])  [id number] Page 2 of 8 Procedural Pain: 0 Post Procedural Pain: 0 Response to Treatment: Procedure was tolerated well Level of Consciousness (Post- Awake and Alert procedure): Post Debridement Measurements of Total Wound Length: (cm) 2 Stage: Category/Stage IV Width: (cm) 0.3 Depth: (cm) 0.3 Volume: (cm) 0.141 Character of Wound/Ulcer Post Debridement: Stable Post Procedure Diagnosis Same as Pre-procedure Electronic Signature(s) Signed: 02/25/2023 4:00:09 PM By: Allen Derry PA-C Signed: 02/28/2023 12:46:12 PM By: Midge Aver MSN RN CNS WTA Entered By: Midge Aver on 02/25/2023 12:45:22 -------------------------------------------------------------------------------- HPI Details Patient Name: Date of Service: [name] 02/25/2023 12:15 PM Medical Record Number: [id number] Patient Account Number: [id number] Date of Birth/Sex: Treating RN: [birth year] (82 y.o. 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 Currently there is not direct bone exposure because she is now covered with granulation tissue her family has been taking care of her at home at this point although she was previously in a facility. This is something that was noted when she ended up having to go to the ER initially on Aug 30, 2022. Subsequently during hospital stent it was started that she had gentamicin applied topically along with Dakin's moistened gauze packing which has been done at home now and seems to be doing very well. She was at [facility] skilled nursing facility for time. She did have Levaquin previous which  she did do very well for her as far as get the infection under control in my opinion I think she is actually seeming to do quite well at this point. She is not having any pain which is also excellent news at this time. She is starting to get around more so she is not completely bedbound although she is a little weak she does have family that is very attentive  and taking great care of her. 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 There is an area of bone that is very thinly covered. Been using Dakin's wet-to-dry dressings. She has in-home caregivers 24/7 and also home health coming out to change dressings/participate in dressing supervision although most of her dressing changes are being done by her own in-home caregivers. 11-04-2022 upon evaluation today patient appears to be doing well currently in regard to her wound. With that being said I do feel like she is making really good progress towards healing currently. I do not see any evidence of active infection locally nor systemically which is excellent news as well. No fevers, chills, nausea, vomiting, or diarrhea. 11-12-2022 patient's wound today appears to be doing okay although to be honest I am a little concerned about the fact that the wound VAC was not put on properly. It was not bridged she was laying on the tubing and this course was not very comfortable for her. Fortunately I do not see any signs of active infection locally or systemically which is great news. No fevers, chills, nausea, vomiting, or diarrhea. 11-18-2022 upon evaluation today patient appears to be doing well currently in regard to her wound. This is actually showing signs of improvement wound VAC was put on much more eloquently this time compared to last I am actually very pleased. He does not like  there may be some irritation around the edges of the wound I think this may be subsequent to an issue going on with infection. We can look into trying to determine what this may be and then adjusting medications as needed to take care of that infection if so. 11-25-2022 upon evaluation today patient appears to be doing well currently in regard to her wound which is actually showing signs of improvement feeling little by little. The irritation around the edges of the wound is better and the wound VAC seems to have been doing well until he ran out of the DuoDERM and apparently last time this was put on the DuoDERM was not utilized. Again I think that is something that has been extremely beneficial for keeping this wound VAC in place without any complications in the past. [name], [name] ([id number]) [id number] Page 3 of 8 8/26; patient comes in with home health changing the wound VAC 3 times a week Monday Wednesday and Friday. Per our intake nurse the tunneling area superiorly has close down nicely. Progress has been good. She did not come in with a wound VAC on, they apparently took it off so she could be a but home health is coming in later today to change it. There is some concerns about next Monday being a holiday with regards to wound VAC change by home health 12-16-2022 upon evaluation today patient appears to be doing well currently in regard to her wound. She has been tolerating the dressing changes without complication. Fortunately there does not appear to be any signs of active infection locally or systemically which is great news. No fevers, chills, nausea, vomiting, or diarrhea. 12-23-2022 upon evaluation today patient appears to be doing well currently in regard to her sacral wound. This is actually showing signs of significant improvement. Fortunately I do not see any evidence of worsening this has filled in quite nicely and really there is no significant depth to the  wound is pretty much flush. I do believe that she may benefit from discontinuation of the VAC on the right least. On hold this week and see where things stand she is in agreement with that plan. 12-30-2022 upon evaluation today patient appears to be doing well currently in regard to her wound which is actually showing signs of excellent improvement. Fortunately I do not see any evidence of worsening overall and I do believe that the patient is making great headway towards complete closure. 01-06-2023 upon evaluation today patient appears to be doing well currently in regard to her wound on the sacral area this is actually showing signs of excellent improvement and actually very pleased with where we stand. Fortunately I do not see any signs of worsening in general. 01-13-2023 upon evaluation today patient appears to be doing excellent in regards to her wound which is actually showing signs of great improvement. The hypergranulation is greatly improved compared to last week after the silver nitrate that there is a little bit of necrotic tissue to remove that something I think I can do today without any significant complication. She is in agreement with that plan. 01-20-2023 upon evaluation today patient actually appears to be making good headway towards closure. Fortunately I do not see any signs of active infection at this time which is great news. No fevers, chills, nausea, vomiting, or diarrhea. 02-03-2023 upon evaluation today patient's wound actually appears to be doing about the same as last time I saw her. Upon discussion with the family it was apparent that the patient may not be keeping the dressing on all the time. On the days when the aide is there she does make sure that is in place but on other days it is uncertain whether or not this is actually being kept in place and it sounds like the answer may be no. Nonetheless I think that in general she seems to be making some headway here towards  closure. 02-17-2023 upon evaluation today patient's wound does not appear to be bigger in size overall but unfortunately does have some bruising around the edges of the wound which is unfortunate. Fortunately I do not see any signs of active infection locally or systemically which is great news. No fevers, chills, nausea, vomiting, or diarrhea. 02-25-2023 upon evaluation today patient appears to be doing well currently in regard to her wound this is much better than last week last week we had a lot of bruising that is definitely not we wanted to see. This week does appear to be better which is great news and in general I think that we are moving in the right direction here. There does not appear to be any signs of active infection at this time. Electronic Signature(s) Signed: 02/25/2023 1:15:45 PM By: Allen Derry PA-C Entered By: Allen Derry on 02/25/2023 13:15:44 -------------------------------------------------------------------------------- Physical Exam Details Patient Name: Date of Service: [name], [name] [name] 02/25/2023 12:15 PM Medical Record Number: [id number] Patient Account Number: [id number] Date of Birth/Sex: Treating RN: Apr 28, 1940 (82 y.o. [name] Primary Care Provider: Hillard Danker Other Clinician: Referring Provider: Treating Provider/Extender: Madelynn Done Weeks in Treatment: 52 Constitutional Well-nourished and well-hydrated in no acute distress. Respiratory normal breathing without difficulty. Psychiatric this patient is able to make decisions and demonstrates good insight into disease process. Alert and Oriented x 3. pleasant and cooperative. Notes Upon inspection patient's wound bed actually showed signs of excellent improvement. I do not see any bruising that we saw last week and I  think she is doing much better at this point which is great news. Electronic Signature(s) Signed: 02/25/2023 1:15:59 PM By: Farrel Demark, Miley [name] ([id number])  [id number] Page 4 of 8 Signed: 02/25/2023 1:15:59 PM By: Allen Derry PA-C Entered By: Allen Derry on 02/25/2023 13:15:59 -------------------------------------------------------------------------------- Physician Orders Details Patient Name: Date of Service: [name], [name] [name] 02/25/2023 12:15 PM Medical Record Number: [id number] Patient Account Number: [id number] Date of Birth/Sex: Treating RN: February 24, 1941 (82 y.o. [name] Primary Care Provider: Hillard Danker Other Clinician: Referring Provider: Treating Provider/Extender: Madelynn Done Weeks in Treatment: 18 The following information was scribed by: Midge Aver The information was scribed for: Allen Derry Verbal / Phone Orders: No Diagnosis Coding ICD-10 Coding Code Description L89.154 Pressure ulcer of sacral region, stage 4 M62.81 Muscle weakness (generalized) C90.00 Multiple myeloma not having achieved remission Follow-up Appointments Return Appointment in 2 weeks. Home Health DISCONTINUE Home Health for Wound Care. - FAMILY WILL DO DRESSING CHANGES. Bathing/ Shower/ Hygiene May shower; gently cleanse wound with antibacterial soap, rinse and pat dry prior to dressing wounds No tub bath. Off-Loading Low air-loss mattress (Group 2) Turn and reposition every 2 hours Wound Treatment Wound #1 - Coccyx Wound Laterality: Midline Cleanser: Normal Saline 3 x Per Week/30 Days Discharge Instructions: Wash your hands with soap and water. Remove old dressing, discard into plastic bag and place into trash. Cleanse the wound with Normal Saline prior to applying a clean dressing using gauze sponges, not tissues or cotton balls. Do not scrub or use excessive force. Pat dry using gauze sponges, not tissue or cotton balls. Prim Dressing: Hydrofera Blue Ready Transfer Foam, 2.5x2.5 (in/in) 3 x Per Week/30 Days ary Discharge Instructions: Apply Hydrofera Blue Ready to wound bed as directed Prim  Dressing: Cavilon No Sting Barrier Film ary 3 x Per Week/30 Days Secondary Dressing: (BORDER) Zetuvit Plus SILICONE BORDER Dressing 5x5 (in/in) (Generic) 3 x Per Week/30 Days Discharge Instructions: Please do not put silicone bordered dressings under wraps. Use non-bordered dressing only. Electronic Signature(s) Signed: 02/25/2023 4:00:09 PM By: Allen Derry PA-C Signed: 02/28/2023 12:46:12 PM By: Midge Aver MSN RN CNS WTA Entered By: Midge Aver on 02/25/2023 12:45:54 [name], [name] ([id number]) [id number] Page 5 of 8 -------------------------------------------------------------------------------- Problem List Details Patient Name: Date of Service: [name], [name] 02/25/2023 12:15 PM Medical Record Number: [id number] Patient Account Number: [id number] Date of Birth/Sex: Treating RN: [birth year] (82 y.o. [name] Primary Care Provider: Hillard Danker Other Clinician: Referring Provider: Treating Provider/Extender: Madelynn Done Weeks in Treatment: 18 Active Problems ICD-10 Encounter Code Description Active Date MDM Diagnosis L89.154 Pressure ulcer of sacral region, stage 4 10/21/2022 No Yes M62.81 Muscle weakness (generalized) 10/21/2022 No Yes C90.00 Multiple myeloma not having achieved remission 10/21/2022 No Yes Inactive Problems Resolved Problems Electronic Signature(s) Signed: 02/25/2023 12:39:45 PM By: Allen Derry PA-C Entered By: Allen Derry on 02/25/2023 12:39:45 -------------------------------------------------------------------------------- Progress Note Details Patient Name: Date of Service: [name] 02/25/2023 12:15 PM Medical Record Number: [id number] Patient Account Number: [id number] Date of Birth/Sex: Treating RN: October 20, 1940 (82 y.o. [name] Primary Care Provider: Hillard Danker Other Clinician: Referring Provider: Treating Provider/Extender: Madelynn Done Weeks in Treatment: 18 Subjective Chief  Complaint Information obtained from Patient [place], [place] [name] ([id number]) [id number] Page 6 of 8 Stage 4 pressure ulcer History of Present Illness (HPI) 10-21-2022 upon evaluation today patient appears to be doing pretty well in general in regard to a fairly significant however pressure ulcer in  the midline sacral/coccyx region which is definitely stage IV. Currently there is not direct bone exposure because she is now covered with granulation tissue her family has been taking care of her at home at this point although she was previously in a facility. This is something that was noted when she ended up having to go to the ER initially on Aug 30, 2022. Subsequently during hospital stent it was started that she had gentamicin applied topically along with Dakin's moistened gauze packing which has been done at home now and seems to be doing very well. She was at [facility] skilled nursing facility for time. She did have Levaquin previous which she did do very well for her as far as get the infection under control in my opinion I think she is actually seeming to do quite well at this point. She is not having any pain which is also excellent news at this time. She is starting to get around more so she is not completely bedbound although she is a little weak she does have family that is very attentive and taking great care of her. 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 There is an area of bone that is very thinly covered. Been using Dakin's wet-to-dry dressings. She has in-home caregivers 24/7 and also home health coming out to change dressings/participate in dressing supervision although most of her dressing changes are being done by her own in-home caregivers. 11-04-2022 upon evaluation today patient  appears to be doing well currently in regard to her wound. With that being said I do feel like she is making really good progress towards healing currently. I do not see any evidence of active infection locally nor systemically which is excellent news as well. No fevers, chills, nausea, vomiting, or diarrhea. 11-12-2022 patient's wound today appears to be doing okay although to be honest I am a little concerned about the fact that the wound VAC was not put on properly. It was not bridged she was laying on the tubing and this course was not very comfortable for her. Fortunately I do not see any signs of active infection locally or systemically which is great news. No fevers, chills, nausea, vomiting, or diarrhea. 11-18-2022 upon evaluation today patient appears to be doing well currently in regard to her wound. This is actually showing signs of improvement wound VAC was put on much more eloquently this time compared to last I am actually very pleased. He does not like there may be some irritation around the edges of the wound I think this may be subsequent to an issue going on with infection. We can look into trying to determine what this may be and then adjusting medications as needed to take care of that infection if so. 11-25-2022 upon evaluation today patient appears to be doing well currently in regard to her wound which is actually showing signs of improvement feeling little by little. The irritation around the edges of the wound is better and the wound VAC seems to have been doing well until he ran out of the DuoDERM and apparently last time this was put on the DuoDERM was not utilized. Again I think that is something that has been extremely beneficial for keeping this wound VAC in place without any complications in the past. 8/26; patient comes in with home health changing the wound  VAC 3 times a week Monday Wednesday and Friday. Per our intake nurse the tunneling area superiorly has close down  nicely. Progress has been good. She did not come in with a wound VAC on, they apparently took it off so she could be a but home health is coming in later today to change it. There is some concerns about next Monday being a holiday with regards to wound VAC change by home health 12-16-2022 upon evaluation today patient appears to be doing well currently in regard to her wound. She has been tolerating the dressing changes without complication. Fortunately there does not appear to be any signs of active infection locally or systemically which is great news. No fevers, chills, nausea, vomiting, or diarrhea. 12-23-2022 upon evaluation today patient appears to be doing well currently in regard to her sacral wound. This is actually showing signs of significant improvement. Fortunately I do not see any evidence of worsening this has filled in quite nicely and really there is no significant depth to the wound is pretty much flush. I do believe that she may benefit from discontinuation of the VAC on the right least. On hold this week and see where things stand she is in agreement with that plan. 12-30-2022 upon evaluation today patient appears to be doing well currently in regard to her wound which is actually showing signs of excellent improvement. Fortunately I do not see any evidence of worsening overall and I do believe that the patient is making great headway towards complete closure. 01-06-2023 upon evaluation today patient appears to be doing well currently in regard to her wound on the sacral area this is actually showing signs of excellent improvement and actually very pleased with where we stand. Fortunately I do not see any signs of worsening in general. 01-13-2023 upon evaluation today patient appears to be doing excellent in regards to her wound which is actually showing signs of great improvement. The hypergranulation is greatly improved compared to last week after the silver nitrate that there is a  little bit of necrotic tissue to remove that something I think I can do today without any significant complication. She is in agreement with that plan. 01-20-2023 upon evaluation today patient actually appears to be making good headway towards closure. Fortunately I do not see any signs of active infection at this time which is great news. No fevers, chills, nausea, vomiting, or diarrhea. 02-03-2023 upon evaluation today patient's wound actually appears to be doing about the same as last time I saw her. Upon discussion with the family it was apparent that the patient may not be keeping the dressing on all the time. On the days when the aide is there she does make sure that is in place but on other days it is uncertain whether or not this is actually being kept in place and it sounds like the answer may be no. Nonetheless I think that in general she seems to be making some headway here towards closure. 02-17-2023 upon evaluation today patient's wound does not appear to be bigger in size overall but unfortunately does have some bruising around the edges of the wound which is unfortunate. Fortunately I do not see any signs of active infection locally or systemically which is great news. No fevers, chills, nausea, vomiting, or diarrhea. 02-25-2023 upon evaluation today patient appears to be doing well currently in regard to her wound this is much better than last week last week we had a lot of bruising that is definitely  not we wanted to see. This week does appear to be better which is great news and in general I think that we are moving in the right direction here. There does not appear to be any signs of active infection at this time. Objective Constitutional Well-nourished and well-hydrated in no acute distress. Vitals Time Taken: 12:22 PM, Temperature: 98.2 F, Pulse: 59 bpm, Respiratory Rate: 16 breaths/min, Blood Pressure: 136/54 mmHg. [name], [name] ([id number])  [id number] Page 7 of 8 Respiratory normal breathing without difficulty. Psychiatric this patient is able to make decisions and demonstrates good insight into disease process. Alert and Oriented x 3. pleasant and cooperative. General Notes: Upon inspection patient's wound bed actually showed signs of excellent improvement. I do not see any bruising that we saw last week and I think she is doing much better at this point which is great news. Integumentary (Hair, Skin) Wound #1 status is Open. Original cause of wound was Pressure Injury. The date acquired was: 08/30/2022. The wound has been in treatment 18 weeks. The wound is located on the Midline Coccyx. The wound measures 2cm length x 0.3cm width x 0.3cm depth; 0.471cm^2 area and 0.141cm^3 volume. There is bone and Fat Layer (Subcutaneous Tissue) exposed. There is a medium amount of serosanguineous drainage noted. There is medium (34-66%) red, pink granulation within the wound bed. There is a medium (34-66%) amount of necrotic tissue within the wound bed. Assessment Active Problems ICD-10 Pressure ulcer of sacral region, stage 4 Muscle weakness (generalized) Multiple myeloma not having achieved remission Procedures Wound #1 Pre-procedure diagnosis of Wound #1 is a Pressure Ulcer located on the Midline Coccyx . There was a Excisional Skin/Subcutaneous Tissue Debridement with a total area of 0.47 sq cm performed by Allen Derry, PA-C. With the following instrument(s): Curette to remove Viable and Non-Viable tissue/material. Material removed includes Subcutaneous Tissue, Slough, and Biofilm after achieving pain control using Lidocaine 4% T opical Solution. No specimens were taken. A time out was conducted at 12:44, prior to the start of the procedure. A Minimum amount of bleeding was controlled with Pressure. The procedure was tolerated well with a pain level of 0 throughout and a pain level of 0 following the  procedure. Post Debridement Measurements: 2cm length x 0.3cm width x 0.3cm depth; 0.141cm^3 volume. Post debridement Stage noted as Category/Stage IV. Character of Wound/Ulcer Post Debridement is stable. Post procedure Diagnosis Wound #1: Same as Pre-Procedure Plan Follow-up Appointments: Return Appointment in 2 weeks. Home Health: DISCONTINUE Home Health for Wound Care. - FAMILY WILL DO DRESSING CHANGES. Bathing/ Shower/ Hygiene: May shower; gently cleanse wound with antibacterial soap, rinse and pat dry prior to dressing wounds No tub bath. Off-Loading: Low air-loss mattress (Group 2) Turn and reposition every 2 hours WOUND #1: - Coccyx Wound Laterality: Midline Cleanser: Normal Saline 3 x Per Week/30 Days Discharge Instructions: Wash your hands with soap and water. Remove old dressing, discard into plastic bag and place into trash. Cleanse the wound with Normal Saline prior to applying a clean dressing using gauze sponges, not tissues or cotton balls. Do not scrub or use excessive force. Pat dry using gauze sponges, not tissue or cotton balls. Prim Dressing: Hydrofera Blue Ready Transfer Foam, 2.5x2.5 (in/in) 3 x Per Week/30 Days ary Discharge Instructions: Apply Hydrofera Blue Ready to wound bed as directed Prim Dressing: Cavilon No Sting Barrier Film 3 x Per Week/30 Days ary Secondary Dressing: (BORDER) Zetuvit Plus SILICONE BORDER Dressing 5x5 (in/in) (Generic) 3 x Per Week/30 Days Discharge Instructions: Please  do not put silicone bordered dressings under wraps. Use non-bordered dressing only. 1. I am going to recommend that we have the patient continue to monitor for any signs of infection or worsening. Based on what I see I do believe that her making excellent headway here towards closure. 2. I am also can recommend that we have the patient continue with the [employer] [facility] [employer] followed by the bordered foam dressing to cover. 3. And also can do suggest she should continue with  appropriate offloading what she has been doing over the past week has done excellent we will continue with that. We will see patient back for reevaluation in 1 week here in the clinic. If anything worsens or changes patient will contact our office for additional recommendations. [name], [name] ([id number]) [id number] Page 8 of 8 Electronic Signature(s) Signed: 02/25/2023 1:16:32 PM By: Allen Derry PA-C Entered By: Allen Derry on 02/25/2023 13:16:31 -------------------------------------------------------------------------------- SuperBill Details Patient Name: Date of Service: [name] 02/25/2023 Medical Record Number: [id number] Patient Account Number: [id number] Date of Birth/Sex: Treating RN: [birth year] (82 y.o. [name] Primary Care Provider: Hillard Danker Other Clinician: Referring Provider: Treating Provider/Extender: Madelynn Done Weeks in Treatment: 18 Diagnosis Coding ICD-10 Codes Code Description L89.154 Pressure ulcer of sacral region, stage 4 M62.81 Muscle weakness (generalized) C90.00 Multiple myeloma not having achieved remission Facility Procedures : 3 CPT4 Code: [id number] Description: 11042 - DEB SUBQ TISSUE 20 SQ CM/< ICD-10 Diagnosis Description L89.154 Pressure ulcer of sacral region, stage 4 Modifier: Quantity: 1 Physician Procedures : CPT4 Code Description Modifier 11042 11042 - WC PHYS SUBQ TISS 20 SQ CM ICD-10 Diagnosis Description L89.154 Pressure ulcer of sacral region, stage 4 Quantity: 1 Electronic Signature(s) Signed: 02/25/2023 1:16:42 PM By: Allen Derry PA-C Entered By: Allen Derry on 02/25/2023 13:16:42

## 2023-02-26 ENCOUNTER — Other Ambulatory Visit: Payer: Medicare PPO

## 2023-02-26 ENCOUNTER — Ambulatory Visit: Payer: Medicare PPO | Admitting: Physician Assistant

## 2023-02-26 ENCOUNTER — Ambulatory Visit: Payer: Medicare PPO

## 2023-02-26 ENCOUNTER — Ambulatory Visit: Payer: Medicare PPO | Admitting: Internal Medicine

## 2023-02-27 DIAGNOSIS — Z79899 Other long term (current) drug therapy: Secondary | ICD-10-CM | POA: Diagnosis not present

## 2023-02-28 ENCOUNTER — Encounter: Payer: Self-pay | Admitting: Internal Medicine

## 2023-02-28 LAB — BASIC METABOLIC PANEL
BUN/Creatinine Ratio: 27 (ref 12–28)
BUN: 22 mg/dL (ref 8–27)
CO2: 22 mmol/L (ref 20–29)
Calcium: 8.6 mg/dL — ABNORMAL LOW (ref 8.7–10.3)
Chloride: 107 mmol/L — ABNORMAL HIGH (ref 96–106)
Creatinine, Ser: 0.82 mg/dL (ref 0.57–1.00)
Glucose: 107 mg/dL — ABNORMAL HIGH (ref 70–99)
Potassium: 4.7 mmol/L (ref 3.5–5.2)
Sodium: 145 mmol/L — ABNORMAL HIGH (ref 134–144)
eGFR: 70 mL/min/{1.73_m2} (ref >59)

## 2023-02-28 NOTE — Progress Notes (Unsigned)
[facility] OFFICE PROGRESS NOTE  [name], MD 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 Dorris Fetch on January 28, 2022.   CURRENT THERAPY: 1) Starting systemic therapy with Revlimid 21 days on 7 days off, Darzalex, and 20 mg of Decadron weekly.  First dose expected next week on 09/11/2022.  Status post 6 cycles.  2) Monthly Zometa injections for 6 to 12 months, followed by every 3 months.  Most recent dose given on 02/19/2023  INTERVAL HISTORY: [name] 82 y.o. female returns to the clinic today for follow-up visit accompanied by her son.  The patient was last seen by Dr. Arbutus Ped on 02/04/23.  At that point in time, the patient is reporting increased bruising.  She also was endorsing swelling in the legs in the evening.  She also had been having increased diarrhea.  She was seen in the symptom management clinic on 02/20/23 due to the diarrhea and hypokalemia.  She was given a prescription for Lomotil and had ***and her diarrhea.  In the interval since last being seen she saw her family doctor with a chief complaint of rib pain.  The patient does have a history of anterior mediastinal mass.  Her CT scan of the chest showed bilateral pulmonary nodules concerning for metastatic disease.  Patient was due for restaging CT scan of her chest this month so she called to have this scheduled which was performed on 02/24/2023.  Overall, she is feeling *** today.   She reports stable fatigue and generalized weakness and denies any changes from her baseline. Denies any fever, chills, night sweats, or unexplained weight loss.    She  drinks protein supplemental drinks once per day. Denies any chest pain, shortness of breath, recent cough, or hemoptysis.  Denies any signs or symptoms of infection (besides the norovirus) including sore throat, nasal congestion, cough, or hemoptysis. She has wound care coming by the house 3x per week for a pressure ulcer. Denies any abnormal bleeding or bruising.  She is here today for evaluation and to review her scan results before undergoing cycle #7.   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 by echo 10/2021   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/2015   Carotid artery stenosis    < 50% stenosis bilaterally by doppler 07/2016   Coronary artery disease 2008   Coronary Ca score of 331 with minimal multivessel plaque < 25% stenosis by coronary CTA 8/23   Heart murmur    per pt   Hypercholesteremia    Hypertension    Obesity    Osteopenia    declines treatment   Pneumonia 1995   Pulmonary HTN (HCC)    mild to moderate by echo 7/23 with PASP   Shoulder pain    Due to arthritis   Vitamin D insufficiency  ALLERGIES:  is allergic to crestor [rosuvastatin calcium], lactose, lipitor [atorvastatin], pravastatin, simvastatin, tramadol, zetia [ezetimibe], codeine, penicillins, sulfa antibiotics, and vancomycin.  MEDICATIONS:  Current Outpatient Medications  Medication Sig Dispense Refill   acetaminophen (TYLENOL) 325 MG tablet Take 2 tablets (650 mg total) by mouth every 8 (eight) hours as needed.     acyclovir (ZOVIRAX) 200 MG capsule Take 1 capsule (200 mg total) by mouth 2 (two) times daily. 30 capsule 0   allopurinol (ZYLOPRIM) 100 MG tablet Take 1 tablet (100 mg total) by mouth 2 (two) times daily. 60 tablet 2   ascorbic  acid (VITAMIN C) 500 MG tablet Take 1 tablet (500 mg total) by mouth daily.     colestipol (COLESTID) 1 g tablet Take 1 g by mouth daily.     daratumumab-hyaluronidase-fihj (DARZALEX FASPRO) 1800-30000 MG-UT/15ML SOLN Inject 1,800 mg into the skin once.     dexamethasone (DECADRON) 4 MG tablet Please take 5 tablets (20 mg) weekly on the days of treatment 40 tablet 3   diphenoxylate-atropine (LOMOTIL) 2.5-0.025 MG tablet Take 1 tablet by mouth 4 (four) times daily as needed for diarrhea or loose stools. 30 tablet 0   HYDROcodone-acetaminophen (NORCO/VICODIN) 5-325 MG tablet Take 1 tablet by mouth every 6 (six) hours as needed for moderate pain.     lenalidomide (REVLIMID) 15 MG capsule Take 1 capsule (15 mg total) by mouth daily. Siri Cole # [id number]     Date Obtained 02/24/23 Adult female 21 capsule 0   losartan (COZAAR) 25 MG tablet Take 1 tablet (25 mg total) by mouth daily. 30 tablet 0   Multiple Vitamin (MULTIVITAMIN WITH MINERALS) TABS tablet Take 1 tablet by mouth daily.     naloxone (NARCAN) nasal spray 4 mg/0.1 mL      oxycodone (OXY-IR) 5 MG capsule Take 5 mg by mouth every 6 (six) hours as needed for pain.     potassium chloride SA (KLOR-CON M) 20 MEQ tablet Take 1 tablet (20 mEq total) by mouth 2 (two) times daily for 10 days. 20 tablet 0   sodium hypochlorite (DAKIN'S 1/4 STRENGTH) 0.125 % SOLN 1 application Externally Once a day for 30 days     No current facility-administered medications for this visit.    SURGICAL HISTORY:  Past Surgical History:  Procedure Laterality Date   AORTIC VALVE REPLACEMENT N/A 10/14/2013   Procedure: AORTIC VALVE REPLACEMENT (AVR);  Surgeon: Alleen Borne, MD;  Location: [facility] OR;  Service: Open Heart Surgery;  Laterality: N/A;   CARDIAC CATHETERIZATION     CATARACT EXTRACTION, BILATERAL     CHOLECYSTECTOMY  04/08/1988   ESOPHAGOGASTRODUODENOSCOPY (EGD) WITH PROPOFOL N/A 09/05/2022   Procedure: ESOPHAGOGASTRODUODENOSCOPY (EGD) WITH PROPOFOL;   Surgeon: Kathi Der, MD;  Location: [facility] ENDOSCOPY;  Service: Gastroenterology;  Laterality: N/A;   INTRAOPERATIVE TRANSESOPHAGEAL ECHOCARDIOGRAM N/A 10/14/2013   Procedure: INTRAOPERATIVE TRANSESOPHAGEAL ECHOCARDIOGRAM;  Surgeon: Alleen Borne, MD;  Location: [facility] OR;  Service: Open Heart Surgery;  Laterality: N/A;   IVC FILTER INSERTION N/A 09/06/2022   Procedure: IVC FILTER INSERTION;  Surgeon: Leonie Douglas, MD;  Location: [facility] INVASIVE CV LAB;  Service: Cardiovascular;  Laterality: N/A;   KYPHOPLASTY Bilateral 03/27/2022   Procedure: KYPHOPLASTY AND BIOPSY THORACIC ELEVEN;  Surgeon: Lisbeth Renshaw, MD;  Location: [facility] OR;  Service: Neurosurgery;  Laterality: Bilateral;   LEFT AND RIGHT HEART CATHETERIZATION WITH CORONARY ANGIOGRAM N/A 09/16/2013   Procedure: LEFT AND RIGHT HEART CATHETERIZATION WITH CORONARY ANGIOGRAM;  Surgeon: Quintella Reichert, MD;  Location: [facility]  CATH LAB;  Service: Cardiovascular;  Laterality: N/A;   TONSILLECTOMY      REVIEW OF SYSTEMS:   Review of Systems  Constitutional: Negative for appetite change, chills, fatigue, fever and unexpected weight change.  HENT:   Negative for mouth sores, nosebleeds, sore throat and trouble swallowing.   Eyes: Negative for eye problems and icterus.  Respiratory: Negative for cough, hemoptysis, shortness of breath and wheezing.   Cardiovascular: Negative for chest pain and leg swelling.  Gastrointestinal: Negative for abdominal pain, constipation, diarrhea, nausea and vomiting.  Genitourinary: Negative for bladder incontinence, difficulty urinating, dysuria, frequency and hematuria.   Musculoskeletal: Negative for back pain, gait problem, neck pain and neck stiffness.  Skin: Negative for itching and rash.  Neurological: Negative for dizziness, extremity weakness, gait problem, headaches, light-headedness and seizures.  Hematological: Negative for adenopathy. Does not bruise/bleed easily.  Psychiatric/Behavioral: Negative for  confusion, depression and sleep disturbance. The patient is not nervous/anxious.     PHYSICAL EXAMINATION:  There were no vitals taken for this visit.  ECOG PERFORMANCE STATUS: {CHL ONC ECOG [id number]  Physical Exam  Constitutional: Oriented to person, place, and time and well-developed, well-nourished, and in no distress. No distress.  HENT:  Head: Normocephalic and atraumatic.  Mouth/Throat: Oropharynx is clear and moist. No oropharyngeal exudate.  Eyes: Conjunctivae are normal. Right eye exhibits no discharge. Left eye exhibits no discharge. No scleral icterus.  Neck: Normal range of motion. Neck supple.  Cardiovascular: Normal rate, regular rhythm, normal heart sounds and intact distal pulses.   Pulmonary/Chest: Effort normal and breath sounds normal. No respiratory distress. No wheezes. No rales.  Abdominal: Soft. Bowel sounds are normal. Exhibits no distension and no mass. There is no tenderness.  Musculoskeletal: Normal range of motion. Exhibits no edema.  Lymphadenopathy:    No cervical adenopathy.  Neurological: Alert and oriented to person, place, and time. Exhibits normal muscle tone. Gait normal. Coordination normal.  Skin: Skin is warm and dry. No rash noted. Not diaphoretic. No erythema. No pallor.  Psychiatric: Mood, memory and judgment normal.  Vitals reviewed.  LABORATORY DATA: Lab Results  Component Value Date   WBC 10.7 (H) 02/20/2023   HGB 11.7 (L) 02/20/2023   HCT 35.4 (L) 02/20/2023   MCV 100.0 02/20/2023   PLT 204 02/20/2023      Chemistry      Component Value Date/Time   NA 145 (H) 02/27/2023 1140   K 4.7 02/27/2023 1140   CL 107 (H) 02/27/2023 1140   CO2 22 02/27/2023 1140   BUN 22 02/27/2023 1140   CREATININE 0.82 02/27/2023 1140   CREATININE 0.82 02/20/2023 1027   CREATININE 0.85 01/29/2016 0910      Component Value Date/Time   CALCIUM 8.6 (L) 02/27/2023 1140   ALKPHOS 156 (H) 02/20/2023 1027   AST 15 02/20/2023 1027   ALT 19  02/20/2023 1027   BILITOT 0.4 02/20/2023 1027       RADIOGRAPHIC STUDIES:  DG Ribs Unilateral W/Chest Right  Result Date: 02/13/2023 CLINICAL DATA:  Right rib pain. EXAM: RIGHT RIBS AND CHEST - 3+ VIEW COMPARISON:  Aug 31, 2022. FINDINGS: Multiple old right rib fractures are noted. There is no evidence of pneumothorax or pleural effusion. No definite acute fracture is noted. Bilateral pulmonary nodules are noted concerning for metastatic disease. Sternotomy wires are noted status post aortic valve repair. Heart size and mediastinal contours are within normal limits. IMPRESSION: Multiple old right rib fractures. No definite acute fracture is noted. Bilateral pulmonary nodules are  noted concerning for metastatic disease. Electronically Signed   By: Lupita Raider M.D.   On: 02/13/2023 14:11     ASSESSMENT/PLAN:  This is a very pleasant 82 year old Caucasian female with  1) history of stage I (T1a, N0, M0) thymoma type AB diagnosed in October 2023 status post resection under the care of Dr. Dorris Fetch on January 28, 2022 with close resection margin and microscopic infiltration of the capsule. 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     She is currently undergoing treatment with systemic therapy with daratumumab, Revlimid 15 mg p.o. daily for 21 days every 4 weeks in addition to Decadron 20 mg weekly with the treatment.  She is status post 6 cycles and tolerating this well.  The patient recently had a restaging CT scan to follow-up on the thymoma. the patient was seen with Dr. Arbutus Ped today.  Dr. Arbutus Ped personally and independently reviewed the scan and discussed results with the patient today.  The scan showed ***.  Dr. Arbutus Ped recommends ***  Regarding her multiple myeloma, labs were reviewed.  Recommend ***  Diarrhea?  Norovirus?  Diarrhea?  She will continue to have Zometa monthly.  We will see her back for follow-up visit in ***weeks before undergoing her  next cycle of treatment. ***Order next myleoma labs.   The patient was advised to call immediately if she has any concerning symptoms in the interval. The patient voices understanding of current disease status and treatment options and is in agreement with the current care plan. All questions were answered. The patient knows to call the clinic with any problems, questions or concerns. We can certainly see the patient much sooner if necessary       No orders of the defined types were placed in this encounter.    I spent {CHL ONC TIME VISIT - WGNFA:[id number]} counseling the patient face to face. The total time spent in the appointment was {CHL ONC TIME VISIT - ONGEX:[id number]}.  Breianna Delfino L Jarian Longoria, PA-C 02/28/23

## 2023-02-28 NOTE — Progress Notes (Signed)
PENT, [name] ([id number]) [id number] Page 1 of 8 Visit Report for 02/25/2023 Arrival Information Details Patient Name: Date of Service: [name], [name] 02/25/2023 12:15 PM Medical Record Number: [id number] Patient Account Number: [id number] Date of Birth/Sex: Treating RN: [birth year] (82 y.o. [name] Primary Care Arash Karstens: Hillard Danker Other Clinician: Referring [name]: Treating [name]/Extender: [name]: 18 Visit Information History Since Last Visit Added or deleted any medications: No Patient Arrived: Ambulatory Any new allergies or adverse reactions: No Arrival Time: 12:18 Has Dressing in Place as Prescribed: Yes Accompanied By: daughter in law Pain Present Now: No Transfer Assistance: None Patient Identification Verified: Yes Secondary Verification Process Completed: Yes Patient Requires Transmission-Based Precautions: No Patient Has Alerts: Yes Patient Alerts: Not Diabetic Electronic Signature(s) Signed: 02/25/2023 12:37:04 PM By: [name] Aver MSN RN CNS WTA Entered By: [name] on 02/25/2023 12:37:04 -------------------------------------------------------------------------------- Clinic Level of Care Assessment Details Patient Name: Date of Service: [name], [name] 02/25/2023 12:15 PM Medical Record Number: [id number] Patient Account Number: [id number] Date of Birth/Sex: Treating RN: [birth year] (82 y.o. [name] Primary Care Havier Deeb: Hillard Danker Other Clinician: Referring Kyrianna Barletta: Treating Tylyn Stankovich/Extender: [name]: 18 Clinic Level of Care Assessment Items TOOL 1 Quantity Score []  - 0 Use when EandM and Procedure is performed on INITIAL visit ASSESSMENTS - Nursing Assessment / Reassessment []  - 0 General Physical Exam (combine w/ comprehensive assessment (listed just below) when performed on new pt. evals) []  - 0 Comprehensive Assessment (HX, ROS,  Risk Assessments, Wounds Hx, etc.) ASSESSMENTS - Wound and Skin Assessment / Reassessment []  - 0 Dermatologic / Skin Assessment (not related to wound area) ASSESSMENTS - Ostomy and/or Continence Assessment and Care [name], [name] ([id number]) [id number] Page 2 of 8 []  - 0 Incontinence Assessment and Management []  - 0 Ostomy Care Assessment and Management (repouching, etc.) PROCESS - Coordination of Care []  - 0 Simple Patient / Family Education for ongoing care []  - 0 Complex (extensive) Patient / Family Education for ongoing care []  - 0 Staff obtains Consents, Records, T Results / Process Orders est []  - 0 Staff telephones HHA, Nursing Homes / Clarify orders / etc []  - 0 Routine Transfer to another Facility (non-emergent condition) []  - 0 Routine Hospital Admission (non-emergent condition) []  - 0 New Admissions / Manufacturing engineer / Ordering NPWT Apligraf, etc. , []  - 0 Emergency Hospital Admission (emergent condition) PROCESS - Special Needs []  - 0 Pediatric / Minor Patient Management []  - 0 Isolation Patient Management []  - 0 Hearing / Language / Visual special needs []  - 0 Assessment of Community assistance (transportation, D/C planning, etc.) []  - 0 Additional assistance / Altered mentation []  - 0 Support Surface(s) Assessment (bed, cushion, seat, etc.) INTERVENTIONS - Miscellaneous []  - 0 External ear exam []  - 0 Patient Transfer (multiple staff / Nurse, adult / Similar devices) []  - 0 Simple Staple / Suture removal (25 or less) []  - 0 Complex Staple / Suture removal (26 or more) []  - 0 Hypo/Hyperglycemic Management (do not check if billed separately) []  - 0 Ankle / Brachial Index (ABI) - do not check if billed separately Has the patient been seen at the hospital within the last three years: Yes Total Score: 0 Level Of Care: ____ Electronic Signature(s) Signed: 02/28/2023 12:46:12 PM By: [name] Aver MSN RN CNS WTA Entered  By: [name] on 02/25/2023 12:46:02 -------------------------------------------------------------------------------- Encounter Discharge Information Details Patient Name: Date of Service: [name] Number NN  [name] 02/25/2023 12:15 PM Medical Record Number: [id number] Patient Account Number: [id number] Date of Birth/Sex: Treating RN: 01/22/1941 (82 y.o. [name] Primary Care Mykenna Viele: Hillard Danker Other Clinician: Referring Tine Mabee: Treating Tylor Gambrill/Extender: [name]: 18 Encounter Discharge Information Items Post Procedure Vitals Discharge Condition: Stable Temperature (F): 98.2 [name], [name] [phone] [id number] Page 3 of 8 Ambulatory Status: Ambulatory Pulse (bpm): 59 Discharge Destination: Home Respiratory Rate (breaths/min): 18 Transportation: Private Auto Blood Pressure (mmHg): 136/24 Accompanied By: daughter in law Schedule Follow-up Appointment: Yes Clinical Summary of Care: Electronic Signature(s) Signed: 02/28/2023 12:46:12 PM By: [name] Aver MSN RN CNS WTA Entered By: [name] on 02/25/2023 12:47:33 -------------------------------------------------------------------------------- Lower Extremity Assessment Details Patient Name: Date of Service: [name], [name] 02/25/2023 12:15 PM Medical Record Number: [id number] Patient Account Number: [id number] Date of Birth/Sex: Treating RN: 07/01/1940 (82 y.o. [name] Primary Care Jood Retana: Hillard Danker Other Clinician: Referring Beulah Matusek: Treating Dorie Ohms/Extender: [name]: 18 Electronic Signature(s) Signed: 02/25/2023 12:38:18 PM By: [name] Aver MSN RN CNS WTA Entered By: [name] on 02/25/2023 12:38:18 -------------------------------------------------------------------------------- Multi Wound Chart Details Patient Name: Date of Service: [name] 02/25/2023 12:15 PM Medical Record Number:  [id number] Patient Account Number: [id number] Date of Birth/Sex: Treating RN: [birth year] (82 y.o. [name] Primary Care Nyeemah Jennette: Hillard Danker Other Clinician: Referring Marylene Masek: Treating Terry Bolotin/Extender: [name]: 18 Vital Signs Height(in): Pulse(bpm): 59 Weight(lbs): Blood Pressure(mmHg): 136/54 Body Mass Index(BMI): Temperature(F): 98.2 Respiratory Rate(breaths/min): 16 [1:Photos:] [N/A:N/A] Midline Coccyx N/A N/A Wound Location: Pressure Injury N/A N/A Wounding Event: Pressure Ulcer N/A N/A Primary Etiology: Hypertension, Received N/A N/A Comorbid History: Chemotherapy 08/30/2022 N/A N/A Date Acquired: 53 N/A N/A Weeks of [name]: Open N/A N/A Wound Status: No N/A N/A Wound Recurrence: 2x0.3x0.3 N/A N/A Measurements L x W x D (cm) 0.471 N/A N/A A (cm) : rea 0.141 N/A N/A Volume (cm) : 98.90% N/A N/A % Reduction in A rea: 99.90% N/A N/A % 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 N/A N/A Classification: Medium N/A N/A Exudate A mount: Serosanguineous N/A N/A Exudate Type: red, brown N/A N/A Exudate Color: Medium (34-66%) N/A N/A Granulation A mount: Red, Pink N/A N/A Granulation Quality: Medium (34-66%) N/A N/A Necrotic A mount: Fat Layer (Subcutaneous Tissue): Yes N/A N/A Exposed Structures: Bone: Yes Fascia: No Tendon: No Muscle: No Joint: No None N/A N/A Epithelialization: [name] Notes Electronic Signature(s) Signed: 02/25/2023 12:38:22 PM By: [name] Aver MSN RN CNS WTA Entered By: [name] on 02/25/2023 12:38:22 -------------------------------------------------------------------------------- Multi-Disciplinary Care Plan Details Patient Name: Date of Service: [name] 02/25/2023 12:15 PM Medical Record Number: [id number] Patient Account Number: [id number] Date of Birth/Sex: Treating RN: 05-22-1940 (82 y.o. [name] Primary Care Clydine Parkison: Hillard Danker Other  Clinician: Referring Dewon Mendizabal: Treating Lexander Tremblay/Extender: [name]: 18 Active Inactive Wound/Skin Impairment Nursing Diagnoses: Impaired tissue integrity Knowledge deficit related to ulceration/compromised skin integrity Goals: Patient/caregiver will verbalize understanding of skin care regimen Date Initiated: 10/21/2022 Date Inactivated: 11/25/2022 Target Resolution Date: 11/21/2022 Goal Status: [name], [name] ([id number]) [id number] Page 5 of 8 Ulcer/skin breakdown will have a volume reduction of 30% by week 4 Date Initiated: 10/21/2022 Date Inactivated: 11/25/2022 Target Resolution Date: 11/21/2022 Goal Status: Met Ulcer/skin breakdown will have a volume reduction of 50% by week 8 Date Initiated: 10/21/2022 Date Inactivated: 12/23/2022 Target Resolution Date: 12/22/2022 Goal Status: Met Ulcer/skin breakdown will have a volume reduction of 80%  by week 12 Date Initiated: 10/21/2022 Date Inactivated: 01/13/2023 Target Resolution Date: 01/21/2023 Goal Status: Met Ulcer/skin breakdown will heal within 14 weeks Date Initiated: 10/21/2022 Target Resolution Date: 04/06/2023 Goal Status: Active Interventions: Assess patient/caregiver ability to obtain necessary supplies Assess patient/caregiver ability to perform ulcer/skin care regimen upon admission and as needed Assess ulceration(s) every visit Provide education on ulcer and skin care [name] Activities: Patient referred to home care : 10/21/2022 Skin care regimen initiated : 10/21/2022 Topical wound management initiated : 10/21/2022 Notes: Electronic Signature(s) Signed: 02/28/2023 12:46:12 PM By: [name] Aver MSN RN CNS WTA Entered By: [name] on 02/25/2023 12:46:26 -------------------------------------------------------------------------------- Pain Assessment Details Patient Name: Date of Service: [name] 02/25/2023 12:15 PM Medical Record Number:  [id number] Patient Account Number: [id number] Date of Birth/Sex: Treating RN: 1940-05-05 (82 y.o. [name] Primary Care Ebonique Hallstrom: Hillard Danker Other Clinician: Referring Deyvi Bonanno: Treating Joanell Cressler/Extender: [name]: 18 Active Problems Location of Pain Severity and Description of Pain Patient Has Paino No Site Locations [place], [place] [name] ([id number]) [id number] Page 6 of 8 Pain Management and Medication Current Pain Management: Electronic Signature(s) Signed: 02/28/2023 12:46:12 PM By: [name] Aver MSN RN CNS WTA Entered By: [name] on 02/25/2023 12:37:12 -------------------------------------------------------------------------------- Patient/Caregiver Education Details Patient Name: Date of Service: Myra Gianotti 11/19/2024andnbsp12:15 PM Medical Record Number: [id number] Patient Account Number: [id number] Date of Birth/Gender: Treating RN: [birth year] (82 y.o. [name] Primary Care Physician: Hillard Danker Other Clinician: Referring Physician: Treating Physician/Extender: [name]: 18 Education Assessment Education Provided To: Patient Education Topics Provided Wound Debridement: Handouts: Wound Debridement Methods: Explain/Verbal Responses: State content correctly Wound/Skin Impairment: Handouts: Caring for Your Ulcer Methods: Explain/Verbal Responses: State content correctly Electronic Signature(s) Signed: 02/28/2023 12:46:12 PM By: [name] Aver MSN RN CNS WTA Entered By: [name] on 02/25/2023 12:46:44 -------------------------------------------------------------------------------- Wound Assessment Details Patient Name: Date of Service: [name] 02/25/2023 12:15 PM Medical Record Number: [id number] Patient Account Number: [id number] Date of Birth/Sex: Treating RN: 1941-03-18 (82 y.o. [name] Primary Care Burke Terry: Hillard Danker Other  Clinician: Referring Hanan Moen: Treating Rhyleigh Grassel/Extender: Marcelyn Ditty, [name] ([id number]) [id number] Page 7 of 8 Weeks in [name]: 18 Wound Status Wound Number: 1 Primary Etiology: Pressure Ulcer Wound Location: Midline Coccyx Wound Status: Open Wounding Event: Pressure Injury Comorbid History: Hypertension, Received Chemotherapy Date Acquired: 08/30/2022 Weeks Of [name]: 18 Clustered Wound: No Photos Wound Measurements Length: (cm) 2 Width: (cm) 0.3 Depth: (cm) 0.3 Area: (cm) 0.471 Volume: (cm) 0.141 % Reduction in Area: 98.9% % Reduction in Volume: 99.9% Epithelialization: None Wound Description Classification: Category/Stage IV Exudate Amount: Medium Exudate Type: Serosanguineous Exudate Color: red, brown Foul Odor After Cleansing: No Slough/Fibrino Yes Wound Bed Granulation Amount: Medium (34-66%) Exposed Structure Granulation Quality: Red, Pink Fascia Exposed: No Necrotic Amount: Medium (34-66%) Fat Layer (Subcutaneous Tissue) Exposed: Yes Tendon Exposed: No Muscle Exposed: No Joint Exposed: No Bone Exposed: Yes [name] Notes Wound #1 (Coccyx) Wound Laterality: Midline Cleanser Normal Saline Discharge Instruction: Wash your hands with soap and water. Remove old dressing, discard into plastic bag and place into trash. Cleanse the wound with Normal Saline prior to applying a clean dressing using gauze sponges, not tissues or cotton balls. Do not scrub or use excessive force. Pat dry using gauze sponges, not tissue or cotton balls. Peri-Wound Care Topical Primary Dressing Hydrofera Blue Ready Transfer Foam, 2.5x2.5 (in/in) Discharge Instruction: Apply Hydrofera Blue Ready to wound bed as directed Cavilon  No Sting Public librarian (BORDER) Zetuvit Plus SILICONE BORDER Dressing 5x5 (in/in) Discharge Instruction: Please do not put silicone bordered dressings under wraps. Use non-bordered  dressing only. Secured With Compression Wrap Compression Stockings [name], [name] ([id number]) [id number] Page 8 of 8 Add-Ons Electronic Signature(s) Signed: 02/28/2023 12:46:12 PM By: [name] Aver MSN RN CNS WTA Entered By: [name] on 02/25/2023 12:30:33 -------------------------------------------------------------------------------- Vitals Details Patient Name: Date of Service: [name] 02/25/2023 12:15 PM Medical Record Number: [id number] Patient Account Number: [id number] Date of Birth/Sex: Treating RN: 17-Oct-1940 (83 y.o. [name] Primary Care Latesa Fratto: Hillard Danker Other Clinician: Referring Sommer Spickard: Treating Chon Buhl/Extender: [name]: 18 Vital Signs Time Taken: 12:22 Temperature (F): 98.2 Pulse (bpm): 59 Respiratory Rate (breaths/min): 16 Blood Pressure (mmHg): 136/54 Reference Range: 80 - 120 mg / dl Electronic Signature(s) Signed: 02/25/2023 12:37:08 PM By: [name] Aver MSN RN CNS WTA Entered By: [name] on 02/25/2023 12:37:08

## 2023-03-03 ENCOUNTER — Inpatient Hospital Stay: Payer: Medicare PPO

## 2023-03-03 ENCOUNTER — Other Ambulatory Visit: Payer: Medicare PPO

## 2023-03-03 ENCOUNTER — Other Ambulatory Visit: Payer: Self-pay | Admitting: Medical Oncology

## 2023-03-03 ENCOUNTER — Ambulatory Visit: Payer: Medicare PPO | Admitting: Physician Assistant

## 2023-03-03 ENCOUNTER — Ambulatory Visit: Payer: Medicare PPO | Admitting: Internal Medicine

## 2023-03-03 ENCOUNTER — Telehealth: Payer: Self-pay | Admitting: Cardiology

## 2023-03-03 ENCOUNTER — Inpatient Hospital Stay: Payer: Medicare PPO | Admitting: Physician Assistant

## 2023-03-03 VITALS — BP 151/62 | HR 59 | Temp 98.1°F | Resp 16 | Wt 139.9 lb

## 2023-03-03 DIAGNOSIS — C9000 Multiple myeloma not having achieved remission: Secondary | ICD-10-CM | POA: Diagnosis not present

## 2023-03-03 DIAGNOSIS — E876 Hypokalemia: Secondary | ICD-10-CM | POA: Diagnosis not present

## 2023-03-03 DIAGNOSIS — Z79899 Other long term (current) drug therapy: Secondary | ICD-10-CM | POA: Diagnosis not present

## 2023-03-03 DIAGNOSIS — Z7961 Long term (current) use of immunomodulator: Secondary | ICD-10-CM | POA: Diagnosis not present

## 2023-03-03 DIAGNOSIS — Z5112 Encounter for antineoplastic immunotherapy: Secondary | ICD-10-CM | POA: Diagnosis not present

## 2023-03-03 DIAGNOSIS — Z79624 Long term (current) use of inhibitors of nucleotide synthesis: Secondary | ICD-10-CM | POA: Diagnosis not present

## 2023-03-03 DIAGNOSIS — R197 Diarrhea, unspecified: Secondary | ICD-10-CM | POA: Diagnosis not present

## 2023-03-03 DIAGNOSIS — C37 Malignant neoplasm of thymus: Secondary | ICD-10-CM

## 2023-03-03 DIAGNOSIS — Z85238 Personal history of other malignant neoplasm of thymus: Secondary | ICD-10-CM | POA: Diagnosis not present

## 2023-03-03 LAB — CMP (CANCER CENTER ONLY)
ALT: 27 U/L (ref 0–44)
AST: 21 U/L (ref 15–41)
Albumin: 3.8 g/dL (ref 3.5–5.0)
Alkaline Phosphatase: 151 U/L — ABNORMAL HIGH (ref 38–126)
Anion gap: 5 (ref 5–15)
BUN: 16 mg/dL (ref 8–23)
CO2: 28 mmol/L (ref 22–32)
Calcium: 8.5 mg/dL — ABNORMAL LOW (ref 8.9–10.3)
Chloride: 110 mmol/L (ref 98–111)
Creatinine: 0.92 mg/dL (ref 0.44–1.00)
GFR, Estimated: >60 mL/min (ref >60)
Glucose, Bld: 110 mg/dL — ABNORMAL HIGH (ref 70–99)
Potassium: 3.7 mmol/L (ref 3.5–5.1)
Sodium: 143 mmol/L (ref 135–145)
Total Bilirubin: 0.4 mg/dL (ref <1.2)
Total Protein: 5.8 g/dL — ABNORMAL LOW (ref 6.5–8.1)

## 2023-03-03 LAB — CBC WITH DIFFERENTIAL (CANCER CENTER ONLY)
Abs Immature Granulocytes: 0.04 10*3/uL (ref 0.00–0.07)
Basophils Absolute: 0.1 10*3/uL (ref 0.0–0.1)
Basophils Relative: 2 %
Eosinophils Absolute: 0.2 10*3/uL (ref 0.0–0.5)
Eosinophils Relative: 2 %
HCT: 37.2 % (ref 36.0–46.0)
Hemoglobin: 12.5 g/dL (ref 12.0–15.0)
Immature Granulocytes: 1 %
Lymphocytes Relative: 23 %
Lymphs Abs: 1.9 10*3/uL (ref 0.7–4.0)
MCH: 34.3 pg — ABNORMAL HIGH (ref 26.0–34.0)
MCHC: 33.6 g/dL (ref 30.0–36.0)
MCV: 102.2 fL — ABNORMAL HIGH (ref 80.0–100.0)
Monocytes Absolute: 1 10*3/uL (ref 0.1–1.0)
Monocytes Relative: 12 %
Neutro Abs: 5 10*3/uL (ref 1.7–7.7)
Neutrophils Relative %: 60 %
Platelet Count: 205 10*3/uL (ref 150–400)
RBC: 3.64 MIL/uL — ABNORMAL LOW (ref 3.87–5.11)
RDW: 14.6 % (ref 11.5–15.5)
WBC Count: 8.3 10*3/uL (ref 4.0–10.5)
nRBC: 0 % (ref 0.0–0.2)

## 2023-03-03 LAB — LACTATE DEHYDROGENASE: LDH: 167 U/L (ref 98–192)

## 2023-03-03 MED ORDER — ACYCLOVIR 200 MG PO CAPS: 200.0000 mg | ORAL_CAPSULE | Freq: Two times a day (BID) | ORAL | 3 refills | Status: DC

## 2023-03-03 MED ORDER — ACETAMINOPHEN 325 MG PO TABS
650.0000 mg | ORAL_TABLET | Freq: Once | ORAL | Status: AC
  Administered 2023-03-03: 650 mg via ORAL
  Filled 2023-03-03: qty 2

## 2023-03-03 MED ORDER — DEXAMETHASONE 4 MG PO TABS
20.0000 mg | ORAL_TABLET | Freq: Once | ORAL | Status: AC
  Administered 2023-03-03: 20 mg via ORAL
  Filled 2023-03-03: qty 5

## 2023-03-03 MED ORDER — DIPHENHYDRAMINE HCL 25 MG PO CAPS
50.0000 mg | ORAL_CAPSULE | Freq: Once | ORAL | Status: AC
  Administered 2023-03-03: 50 mg via ORAL
  Filled 2023-03-03: qty 2

## 2023-03-03 MED ORDER — DARATUMUMAB-HYALURONIDASE-FIHJ 1800-30000 MG-UT/15ML ~~LOC~~ SOLN
1800.0000 mg | Freq: Once | SUBCUTANEOUS | Status: AC
Start: 2023-03-03 — End: 2023-03-03
  Administered 2023-03-03: 1800 mg via SUBCUTANEOUS
  Filled 2023-03-03: qty 15

## 2023-03-03 NOTE — Telephone Encounter (Signed)
Reviewed stable labs (BMET done 02/27/23) with patient and son [name] (DPR), who verbalized understanding.

## 2023-03-03 NOTE — Telephone Encounter (Signed)
Patient's son would like a call back to discuss lab results.

## 2023-03-03 NOTE — Patient Instructions (Signed)

## 2023-03-03 NOTE — Telephone Encounter (Signed)
Call to patient and to son [name] [facility]) regarding BP log. 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

## 2023-03-04 ENCOUNTER — Ambulatory Visit: Payer: Medicare PPO | Admitting: Physician Assistant

## 2023-03-04 ENCOUNTER — Ambulatory Visit: Payer: Medicare PPO

## 2023-03-04 ENCOUNTER — Encounter: Payer: Medicare PPO | Admitting: Physician Assistant

## 2023-03-04 ENCOUNTER — Other Ambulatory Visit: Payer: Medicare PPO

## 2023-03-04 DIAGNOSIS — L89154 Pressure ulcer of sacral region, stage 4: Secondary | ICD-10-CM | POA: Diagnosis not present

## 2023-03-04 DIAGNOSIS — M6281 Muscle weakness (generalized): Secondary | ICD-10-CM | POA: Diagnosis not present

## 2023-03-04 DIAGNOSIS — C9000 Multiple myeloma not having achieved remission: Secondary | ICD-10-CM | POA: Diagnosis not present

## 2023-03-04 DIAGNOSIS — L89153 Pressure ulcer of sacral region, stage 3: Secondary | ICD-10-CM | POA: Diagnosis not present

## 2023-03-04 NOTE — Progress Notes (Signed)
[name], [name] ([id number]) [id number] Page 1 of 7 Visit Report for 03/04/2023 Chief Complaint Document Details Patient Name: Date of Service: [name], [name] [name] 03/04/2023 12:15 PM Medical Record Number: [id number] Patient Account Number: [id number] Date of Birth/Sex: Treating RN: [birth year] (82 y.o. [name] Primary Care Provider: Hillard Danker Other Clinician: Referring Provider: Treating Provider/Extender: Madelynn Done Weeks in Treatment: 19 Information Obtained from: Patient Chief Complaint Stage 4 pressure ulcer Electronic Signature(s) Signed: 03/04/2023 12:45:52 PM By: Allen Derry PA-C Entered By: Allen Derry on 03/04/2023 12:45:52 -------------------------------------------------------------------------------- HPI Details Patient Name: Date of Service: [name] 03/04/2023 12:15 PM Medical Record Number: [id number] Patient Account Number: [id number] Date of Birth/Sex: Treating RN: 1941-02-22 (82 y.o. 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 Currently there is not direct bone exposure because she is now covered with granulation tissue her family has been taking care of her at home at this point although she was previously in a facility. This is something that was noted when she ended up having to go to the ER initially on Aug 30, 2022. Subsequently during hospital stent it was started that she had gentamicin applied topically along with Dakin's moistened gauze packing which has been done at home now and seems to be doing very well. She was at [facility] skilled  nursing facility for time. She did have Levaquin previous which she did do very well for her as far as get the infection under control in my opinion I think she is actually seeming to do quite well at this point. She is not having any pain which is also excellent news at this time. She is starting to get around more so she is not completely bedbound although she is a little weak she does have family that is very attentive and taking great care of her. 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 There is an area of bone that is very thinly covered. Been using Dakin's wet-to-dry dressings. She has in-home caregivers 24/7 and also home health coming out to change dressings/participate in dressing supervision although most of her dressing changes are being done by her own in-home caregivers. 11-04-2022 upon evaluation today patient appears to be doing well currently in regard to her wound. With that being said I do feel like she is making really good progress towards healing currently. I do not see any evidence of active infection locally nor systemically which is excellent news as well. No fevers, chills, [name], [name] ([id number]) [phone] Page 2 of 7 nausea, vomiting, or diarrhea. 11-12-2022 patient's wound today appears to be doing okay although to be honest I am a little concerned about the fact that the wound VAC was not put on properly. It was not bridged she was laying on the tubing and this course was not very comfortable for her. Fortunately I do not see any signs of active infection locally or systemically which is great news. No fevers, chills, nausea, vomiting, or diarrhea. 11-18-2022 upon evaluation today patient appears to be doing well  currently in regard to her wound. This is actually  showing signs of improvement wound VAC was put on much more eloquently this time compared to last I am actually very pleased. He does not like there may be some irritation around the edges of the wound I think this may be subsequent to an issue going on with infection. We can look into trying to determine what this may be and then adjusting medications as needed to take care of that infection if so. 11-25-2022 upon evaluation today patient appears to be doing well currently in regard to her wound which is actually showing signs of improvement feeling little by little. The irritation around the edges of the wound is better and the wound VAC seems to have been doing well until he ran out of the DuoDERM and apparently last time this was put on the DuoDERM was not utilized. Again I think that is something that has been extremely beneficial for keeping this wound VAC in place without any complications in the past. 8/26; patient comes in with home health changing the wound VAC 3 times a week Monday Wednesday and Friday. Per our intake nurse the tunneling area superiorly has close down nicely. Progress has been good. She did not come in with a wound VAC on, they apparently took it off so she could be a but home health is coming in later today to change it. There is some concerns about next Monday being a holiday with regards to wound VAC change by home health 12-16-2022 upon evaluation today patient appears to be doing well currently in regard to her wound. She has been tolerating the dressing changes without complication. Fortunately there does not appear to be any signs of active infection locally or systemically which is great news. No fevers, chills, nausea, vomiting, or diarrhea. 12-23-2022 upon evaluation today patient appears to be doing well currently in regard to her sacral wound. This is actually showing signs of significant improvement. Fortunately I do not see any evidence of worsening this has  filled in quite nicely and really there is no significant depth to the wound is pretty much flush. I do believe that she may benefit from discontinuation of the VAC on the right least. On hold this week and see where things stand she is in agreement with that plan. 12-30-2022 upon evaluation today patient appears to be doing well currently in regard to her wound which is actually showing signs of excellent improvement. Fortunately I do not see any evidence of worsening overall and I do believe that the patient is making great headway towards complete closure. 01-06-2023 upon evaluation today patient appears to be doing well currently in regard to her wound on the sacral area this is actually showing signs of excellent improvement and actually very pleased with where we stand. Fortunately I do not see any signs of worsening in general. 01-13-2023 upon evaluation today patient appears to be doing excellent in regards to her wound which is actually showing signs of great improvement. The hypergranulation is greatly improved compared to last week after the silver nitrate that there is a little bit of necrotic tissue to remove that something I think I can do today without any significant complication. She is in agreement with that plan. 01-20-2023 upon evaluation today patient actually appears to be making good headway towards closure. Fortunately I do not see any signs of active infection at this time which is great news. No fevers, chills, nausea, vomiting, or diarrhea. 02-03-2023 upon  evaluation today patient's wound actually appears to be doing about the same as last time I saw her. Upon discussion with the family it was apparent that the patient may not be keeping the dressing on all the time. On the days when the aide is there she does make sure that is in place but on other days it is uncertain whether or not this is actually being kept in place and it sounds like the answer may be no. Nonetheless I  think that in general she seems to be making some headway here towards closure. 02-17-2023 upon evaluation today patient's wound does not appear to be bigger in size overall but unfortunately does have some bruising around the edges of the wound which is unfortunate. Fortunately I do not see any signs of active infection locally or systemically which is great news. No fevers, chills, nausea, vomiting, or diarrhea. 02-25-2023 upon evaluation today patient appears to be doing well currently in regard to her wound this is much better than last week last week we had a lot of bruising that is definitely not we wanted to see. This week does appear to be better which is great news and in general I think that we are moving in the right direction here. There does not appear to be any signs of active infection at this time. 03-04-2023 upon evaluation today patient appears to be doing well currently in regard to her wound which is actually showing signs of improvement compared to last time I feel like she is calm actually quite a ways. Fortunately I do not see any signs of active infection locally or systemically at this time which is great news. No fevers, chills, nausea, vomiting, or diarrhea. Electronic Signature(s) Signed: 03/04/2023 12:46:30 PM By: Allen Derry PA-C Entered By: Allen Derry on 03/04/2023 12:46:30 -------------------------------------------------------------------------------- Physical Exam Details Patient Name: Date of Service: [name], [name] [name] 03/04/2023 12:15 PM Medical Record Number: [id number] Patient Account Number: [id number] Date of Birth/Sex: Treating RN: [birth year] (82 y.o. [name] Primary Care Provider: Hillard Danker Other Clinician: Referring Provider: Treating Provider/Extender: Madelynn Done Weeks in Treatment: [address] [name] ([id number]) [id number] Page 3 of 7 Constitutional Well-nourished and well-hydrated in no acute  distress. Respiratory normal breathing without difficulty. Psychiatric this patient is able to make decisions and demonstrates good insight into disease process. Alert and Oriented x 3. pleasant and cooperative. Notes Upon inspection patient's wound bed actually showed signs of good granulation and epithelization at this point. Fortunately I do not see signs of worsening overall and I do believe that the patient is doing pretty well here currently with regard to her wounds. There was an area of irritation distal to where the actual wound is and I think this may have been something that Junious Dresser got pinched to some degree when she was studying. Electronic Signature(s) Signed: 03/04/2023 12:46:42 PM By: Allen Derry PA-C Entered By: Allen Derry on 03/04/2023 12:46:41 -------------------------------------------------------------------------------- Physician Orders Details Patient Name: Date of Service: [name], [name] [name] 03/04/2023 12:15 PM Medical Record Number: [id number] Patient Account Number: [id number] Date of Birth/Sex: Treating RN: [birth year] (82 y.o. [name] Primary Care Provider: Hillard Danker Other Clinician: Referring Provider: Treating Provider/Extender: Madelynn Done Weeks in Treatment: 19 The following information was scribed by: Midge Aver The information was scribed for: Allen Derry Verbal / Phone Orders: No Diagnosis Coding Follow-up Appointments Return Appointment in 2 weeks. Home Health DISCONTINUE Home Health for Wound Care. - FAMILY WILL DO DRESSING  CHANGES. Place a small piece of hydrofera blue on the border foam and place over the wound high enough to put some A and D ointment on the lower breakdown area Bathing/ Shower/ Hygiene May shower; gently cleanse wound with antibacterial soap, rinse and pat dry prior to dressing wounds No tub bath. Off-Loading Low air-loss mattress (Group 2) Turn and reposition every 2 hours Wound Treatment Wound #1 -  Coccyx Wound Laterality: Midline Cleanser: Normal Saline 3 x Per Week/30 Days Discharge Instructions: Wash your hands with soap and water. Remove old dressing, discard into plastic bag and place into trash. Cleanse the wound with Normal Saline prior to applying a clean dressing using gauze sponges, not tissues or cotton balls. Do not scrub or use excessive force. Pat dry using gauze sponges, not tissue or cotton balls. Prim Dressing: Hydrofera Blue Ready Transfer Foam, 2.5x2.5 (in/in) 3 x Per Week/30 Days ary Discharge Instructions: Apply Hydrofera Blue Ready to wound bed as directed Prim Dressing: Cavilon No Sting Barrier Film ary 3 x Per Week/30 Days Secondary Dressing: (BORDER) Zetuvit Plus SILICONE BORDER Dressing 5x5 (in/in) (Generic) 3 x Per Week/30 Days Discharge Instructions: Please do not put silicone bordered dressings under wraps. Use non-bordered dressing only. MIRSKY, [name] ([id number]) [id number] Page 4 of 7 Electronic Signature(s) Unsigned Entered By: Midge Aver on 03/04/2023 12:48:27 -------------------------------------------------------------------------------- Problem List Details Patient Name: Date of Service: [name], [name] [name] 03/04/2023 12:15 PM Medical Record Number: [id number] Patient Account Number: [id number] Date of Birth/Sex: Treating RN: 05-30-1940 (82 y.o. [name] Primary Care Provider: Hillard Danker Other Clinician: Referring Provider: Treating Provider/Extender: Madelynn Done Weeks in Treatment: 19 Active Problems ICD-10 Encounter Code Description Active Date MDM Diagnosis L89.154 Pressure ulcer of sacral region, stage 4 10/21/2022 No Yes M62.81 Muscle weakness (generalized) 10/21/2022 No Yes C90.00 Multiple myeloma not having achieved remission 10/21/2022 No Yes Inactive Problems Resolved Problems Electronic Signature(s) Signed: 03/04/2023 12:45:49 PM By: Allen Derry PA-C Entered By: Allen Derry on  03/04/2023 12:45:49 Progress Note Details -------------------------------------------------------------------------------- [name] ([id number]) [id number] Page 5 of 7 Patient Name: Date of Service: [name], [name] [name] 03/04/2023 12:15 PM Medical Record Number: [id number] Patient Account Number: [id number] Date of Birth/Sex: Treating RN: [birth year] (82 y.o. 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 Currently there is not direct bone exposure because she is now covered with granulation tissue her family has been taking care of her at home at this point although she was previously in a facility. This is something that was noted when she ended up having to go to the ER initially on Aug 30, 2022. Subsequently during hospital stent it was started that she had gentamicin applied topically along with Dakin's moistened gauze packing which has been done at home now and seems to be doing very well. She was at [facility] skilled nursing facility for time. She did have Levaquin previous which she did do very well for her as far as get the infection under control in my opinion I think she is actually seeming to do quite well at this point. She is not having any pain which is also excellent news  at this time. She is starting to get around more so she is not completely bedbound although she is a little weak she does have family that is very attentive and taking great care of her. Patient does have multiple myeloma and generalized muscle  weakness but otherwise no major medical problems currently. 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 There is an area of bone that is very thinly covered. Been using Dakin's wet-to-dry dressings. She has in-home caregivers 24/7 and also home health coming out to change dressings/participate in dressing supervision although most of her dressing changes are being done by her own in-home caregivers. 11-04-2022 upon evaluation today patient appears to be doing well currently in regard to her wound. With that being said I do feel like she is making really good progress towards healing currently. I do not see any evidence of active infection locally nor systemically which is excellent news as well. No fevers, chills, nausea, vomiting, or diarrhea. 11-12-2022 patient's wound today appears to be doing okay although to be honest I am a little concerned about the fact that the wound VAC was not put on properly. It was not bridged she was laying on the tubing and this course was not very comfortable for her. Fortunately I do not see any signs of active infection locally or systemically which is great news. No fevers, chills, nausea, vomiting, or diarrhea. 11-18-2022 upon evaluation today patient appears to be doing well currently in regard to her wound. This is actually showing signs of improvement wound VAC was put on much more eloquently this time compared to last I am actually very pleased. He does not like there may be some irritation around the edges of the wound I think this may be subsequent to an issue going on with infection. We can look into trying to determine what this may be and then adjusting medications as needed to take care of that infection if so. 11-25-2022 upon evaluation today patient appears to be doing well currently in regard to her wound which is actually showing signs of improvement feeling little by little. The irritation around  the edges of the wound is better and the wound VAC seems to have been doing well until he ran out of the DuoDERM and apparently last time this was put on the DuoDERM was not utilized. Again I think that is something that has been extremely beneficial for keeping this wound VAC in place without any complications in the past. 8/26; patient comes in with home health changing the wound VAC 3 times a week Monday Wednesday and Friday. Per our intake nurse the tunneling area superiorly has close down nicely. Progress has been good. She did not come in with a wound VAC on, they apparently took it off so she could be a but home health is coming in later today to change it. There is some concerns about next Monday being a holiday with regards to wound VAC change by home health 12-16-2022 upon evaluation today patient appears to be doing well currently in regard to her wound. She has been tolerating the dressing changes without complication. Fortunately there does not appear to be any signs of active infection locally or systemically which is great news. No fevers, chills, nausea, vomiting, or diarrhea. 12-23-2022 upon evaluation today patient appears to be doing well currently in regard to her sacral wound. This is actually showing signs of significant improvement. Fortunately I do  not see any evidence of worsening this has filled in quite nicely and really there is no significant depth to the wound is pretty much flush. I do believe that she may benefit from discontinuation of the VAC on the right least. On hold this week and see where things stand she is in agreement with that plan. 12-30-2022 upon evaluation today patient appears to be doing well currently in regard to her wound which is actually showing signs of excellent improvement. Fortunately I do not see any evidence of worsening overall and I do believe that the patient is making great headway towards complete closure. 01-06-2023 upon evaluation today  patient appears to be doing well currently in regard to her wound on the sacral area this is actually showing signs of excellent improvement and actually very pleased with where we stand. Fortunately I do not see any signs of worsening in general. 01-13-2023 upon evaluation today patient appears to be doing excellent in regards to her wound which is actually showing signs of great improvement. The hypergranulation is greatly improved compared to last week after the silver nitrate that there is a little bit of necrotic tissue to remove that something I think I can do today without any significant complication. She is in agreement with that plan. 01-20-2023 upon evaluation today patient actually appears to be making good headway towards closure. Fortunately I do not see any signs of active infection at this time which is great news. No fevers, chills, nausea, vomiting, or diarrhea. 02-03-2023 upon evaluation today patient's wound actually appears to be doing about the same as last time I saw her. Upon discussion with the family it was apparent that the patient may not be keeping the dressing on all the time. On the days when the aide is there she does make sure that is in place but on other days it is uncertain whether or not this is actually being kept in place and it sounds like the answer may be no. Nonetheless I think that in general she seems to be making some headway here towards closure. 02-17-2023 upon evaluation today patient's wound does not appear to be bigger in size overall but unfortunately does have some bruising around the edges of the wound which is unfortunate. Fortunately I do not see any signs of active infection locally or systemically which is great news. No fevers, chills, nausea, vomiting, or diarrhea. 02-25-2023 upon evaluation today patient appears to be doing well currently in regard to her wound this is much better than last week last week we had a lot of bruising that is  definitely not we wanted to see. This week does appear to be better which is great news and in general I think that we are moving in the right direction here. There does not appear to be any signs of active infection at this time. 03-04-2023 upon evaluation today patient appears to be doing well currently in regard to her wound which is actually showing signs of improvement compared to last time I feel like she is calm actually quite a ways. Fortunately I do not see any signs of active infection locally or systemically at this time which is great news. No fevers, chills, nausea, vomiting, or diarrhea. [name], [name] ([id number]) [id number] Page 6 of 7 Objective Constitutional Well-nourished and well-hydrated in no acute distress. Vitals Time Taken: 12:25 PM, Temperature: 98.3 F, Pulse: 56 bpm, Respiratory Rate: 16 breaths/min, Blood Pressure: 148/56 mmHg. Respiratory normal breathing without difficulty. Psychiatric this patient is  able to make decisions and demonstrates good insight into disease process. Alert and Oriented x 3. pleasant and cooperative. General Notes: Upon inspection patient's wound bed actually showed signs of good granulation and epithelization at this point. Fortunately I do not see signs of worsening overall and I do believe that the patient is doing pretty well here currently with regard to her wounds. There was an area of irritation distal to where the actual wound is and I think this may have been something that Junious Dresser got pinched to some degree when she was studying. Integumentary (Hair, Skin) Wound #1 status is Open. Original cause of wound was Pressure Injury. The date acquired was: 08/30/2022. The wound has been in treatment 19 weeks. The wound is located on the Midline Coccyx. The wound measures 1.5cm length x 0.3cm width x 0.2cm depth; 0.353cm^2 area and 0.071cm^3 volume. There is bone and Fat Layer (Subcutaneous Tissue) exposed. There is a  medium amount of serosanguineous drainage noted. There is medium (34-66%) red, pink granulation within the wound bed. There is a medium (34-66%) amount of necrotic tissue within the wound bed. Assessment Active Problems ICD-10 Pressure ulcer of sacral region, stage 4 Muscle weakness (generalized) Multiple myeloma not having achieved remission Plan 1. Based on what I am seeing I do believe that the patient would benefit currently from a continuation of therapy with regard to the [facility] which I feel like is doing a pretty good job here. 2. I am going to recommend as well that the patient should continue to monitor for any signs of infection or worsening. 3. I am going to recommend as well that the patient should continue to offload is much as possible I think she is been doing a really good job trying to stay off of this and it shows and how the wound appears. We will see patient back for reevaluation in 1 week here in the clinic. If anything worsens or changes patient will contact our office for additional recommendations. Electronic Signature(s) Signed: 03/04/2023 12:47:38 PM By: Allen Derry PA-C Entered By: Allen Derry on 03/04/2023 12:47:37 SuperBill Details -------------------------------------------------------------------------------- [name] ([id number]) [id number] Page 7 of 7 Patient Name: Date of Service: [name], [name] 03/04/2023 Medical Record Number: [id number] Patient Account Number: [id number] Date of Birth/Sex: Treating RN: January 06, 1941 (82 y.o. 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

## 2023-03-06 NOTE — Progress Notes (Signed)
[name], [name] ([id number]) [id number] Page 1 of 9 Visit Report for 03/04/2023 Arrival Information Details Patient Name: Date of Service: [name], [name] [name] 03/04/2023 12:15 PM Medical Record Number: [id number] Patient Account Number: [id number] Date of Birth/Sex: Treating RN: Aug 28, 1940 (82 y.o. [name] Primary Care Queena Monrreal: Hillard Danker Other Clinician: Referring Jamelah Sitzer: Treating Cassidi Modesitt/Extender: Madelynn Done Weeks in Treatment: 19 Visit Information History Since Last Visit Added or deleted any medications: No Patient Arrived: [name] Any new allergies or adverse reactions: No Arrival Time: 12:21 Has Dressing in Place as Prescribed: Yes Accompanied By: SON Pain Present Now: No Transfer Assistance: None Patient Requires Transmission-Based Precautions: No Patient Has Alerts: Yes Patient Alerts: Not Diabetic Electronic Signature(s) Signed: 03/05/2023 4:53:00 PM By: Midge Aver MSN RN CNS WTA Entered By: Midge Aver on 03/04/2023 12:25:15 -------------------------------------------------------------------------------- Clinic Level of Care Assessment Details Patient Name: Date of Service: JULIAROSE, WEISSENBORN [name] 03/04/2023 12:15 PM Medical Record Number: [id number] Patient Account Number: [id number] Date of Birth/Sex: Treating RN: [birth year] (82 y.o. [name] Primary Care Mats Jeanlouis: Hillard Danker Other Clinician: Referring Maryela Tapper: Treating Swayze Pries/Extender: Madelynn Done Weeks in Treatment: 19 Clinic Level of Care Assessment Items TOOL 4 Quantity Score X- 1 0 Use when only an EandM is performed on FOLLOW-UP visit ASSESSMENTS - Nursing Assessment / Reassessment X- 1 10 Reassessment of Co-morbidities (includes updates in patient status) X- 1 5 Reassessment of Adherence to Treatment Plan ASSESSMENTS - Wound and Skin A ssessment / Reassessment []  - 0 Simple Wound Assessment / Reassessment - one wound []  - 0 Complex Wound  Assessment / Reassessment - multiple wounds [name], [name] ([id number]) [id number] Page 2 of 9 []  - 0 Dermatologic / Skin Assessment (not related to wound area) ASSESSMENTS - Focused Assessment []  - 0 Circumferential Edema Measurements - multi extremities []  - 0 Nutritional Assessment / Counseling / Intervention []  - 0 Lower Extremity Assessment (monofilament, tuning fork, pulses) []  - 0 Peripheral Arterial Disease Assessment (using hand held doppler) ASSESSMENTS - Ostomy and/or Continence Assessment and Care []  - 0 Incontinence Assessment and Management []  - 0 Ostomy Care Assessment and Management (repouching, etc.) PROCESS - Coordination of Care X - Simple Patient / Family Education for ongoing care 1 15 []  - 0 Complex (extensive) Patient / Family Education for ongoing care X- 1 10 Staff obtains Chiropractor, Records, T Results / Process Orders est []  - 0 Staff telephones HHA, Nursing Homes / Clarify orders / etc []  - 0 Routine Transfer to another Facility (non-emergent condition) []  - 0 Routine Hospital Admission (non-emergent condition) []  - 0 New Admissions / Manufacturing engineer / Ordering NPWT Apligraf, etc. , []  - 0 Emergency Hospital Admission (emergent condition) X- 1 10 Simple Discharge Coordination []  - 0 Complex (extensive) Discharge Coordination PROCESS - Special Needs []  - 0 Pediatric / Minor Patient Management []  - 0 Isolation Patient Management []  - 0 Hearing / Language / Visual special needs []  - 0 Assessment of Community assistance (transportation, D/C planning, etc.) []  - 0 Additional assistance / Altered mentation []  - 0 Support Surface(s) Assessment (bed, cushion, seat, etc.) INTERVENTIONS - Wound Cleansing / Measurement X - Simple Wound Cleansing - one wound 1 5 []  - 0 Complex Wound Cleansing - multiple wounds X- 1 5 Wound Imaging (photographs - any number of wounds) []  - 0 Wound Tracing (instead of  photographs) X- 1 5 Simple Wound Measurement - one wound []  - 0 Complex Wound Measurement - multiple wounds INTERVENTIONS - Wound Dressings  X - Small Wound Dressing one or multiple wounds 1 10 []  - 0 Medium Wound Dressing one or multiple wounds []  - 0 Large Wound Dressing one or multiple wounds []  - 0 Application of Medications - topical []  - 0 Application of Medications - injection INTERVENTIONS - Miscellaneous []  - 0 External ear exam []  - 0 Specimen Collection (cultures, biopsies, blood, body fluids, etc.) []  - 0 Specimen(s) / Culture(s) sent or taken to Lab for analysis []  - 0 Patient Transfer (multiple staff / Teddy Spike / Similar devices) [name], [name] ([id number]) [id number] Page 3 of 9 []  - 0 Simple Staple / Suture removal (25 or less) []  - 0 Complex Staple / Suture removal (26 or more) []  - 0 Hypo / Hyperglycemic Management (close monitor of Blood Glucose) []  - 0 Ankle / Brachial Index (ABI) - do not check if billed separately X- 1 5 Vital Signs Has the patient been seen at the hospital within the last three years: Yes Total Score: 80 Level Of Care: New/Established - Level 3 Electronic Signature(s) Signed: 03/05/2023 4:53:00 PM By: Midge Aver MSN RN CNS WTA Entered By: Midge Aver on 03/04/2023 12:54:41 -------------------------------------------------------------------------------- Encounter Discharge Information Details Patient Name: Date of Service: [name] 03/04/2023 12:15 PM Medical Record Number: [id number] Patient Account Number: [id number] Date of Birth/Sex: Treating RN: 1940-06-11 (82 y.o. [name] Primary Care Yulissa Needham: Hillard Danker Other Clinician: Referring Illene Sweeting: Treating Caprina Wussow/Extender: Madelynn Done Weeks in Treatment: 19 Encounter Discharge Information Items Discharge Condition: Stable Ambulatory Status: Walker Discharge Destination: Home Transportation: Private Auto Accompanied  ByShari Heritage Schedule Follow-up Appointment: Yes Clinical Summary of Care: Electronic Signature(s) Signed: 03/05/2023 4:53:00 PM By: Midge Aver MSN RN CNS WTA Entered By: Midge Aver on 03/04/2023 12:55:49 -------------------------------------------------------------------------------- Lower Extremity Assessment Details Patient Name: Date of Service: ANAYAT, NOLT [name] 03/04/2023 12:15 PM Medical Record Number: [id number] Patient Account Number: [id number] Date of Birth/Sex: Treating RN: Nov 10, 1940 (82 y.o. [name] Primary Care Jermarion Poffenberger: Hillard Danker Other Clinician: Referring Caron Ode: Treating Solstice Lastinger/Extender: Madelynn Done Weeks in Treatment: [address], Elleanor [name] ([id number]) [id number] Page 4 of 9 Electronic Signature(s) Signed: 03/05/2023 4:53:00 PM By: Midge Aver MSN RN CNS WTA Entered By: Midge Aver on 03/04/2023 12:32:19 -------------------------------------------------------------------------------- Multi Wound Chart Details Patient Name: Date of Service: [name] 03/04/2023 12:15 PM Medical Record Number: [id number] Patient Account Number: [id number] Date of Birth/Sex: Treating RN: 29-Nov-1940 (82 y.o. [name] Primary Care Ingram Onnen: Hillard Danker Other Clinician: Referring Alani Sabbagh: Treating Quill Grinder/Extender: Madelynn Done Weeks in Treatment: 19 Vital Signs Height(in): Pulse(bpm): 56 Weight(lbs): Blood Pressure(mmHg): 148/56 Body Mass Index(BMI): Temperature(F): 98.3 Respiratory Rate(breaths/min): 16 [1:Photos:] [N/A:N/A] Midline Coccyx N/A N/A Wound Location: Pressure Injury N/A N/A Wounding Event: Pressure Ulcer N/A N/A Primary Etiology: Hypertension, Received N/A N/A Comorbid History: Chemotherapy 08/30/2022 N/A N/A Date Acquired: 60 N/A N/A Weeks of Treatment: Open N/A N/A Wound Status: No N/A N/A Wound Recurrence: 1.5x0.3x0.2 N/A N/A Measurements L x W x D (cm) 0.353 N/A N/A A (cm)  : rea 0.071 N/A N/A Volume (cm) : 99.20% N/A N/A % Reduction in A rea: 99.90% N/A N/A % 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 N/A N/A Classification: Medium N/A N/A Exudate A mount: Serosanguineous N/A N/A Exudate Type: red, brown N/A N/A Exudate Color: Medium (34-66%) N/A N/A Granulation A mount: Red, Pink N/A N/A Granulation Quality: Medium (34-66%) N/A N/A Necrotic A mount: Fat Layer (Subcutaneous Tissue): Yes N/A N/A Exposed Structures: Bone: Yes Fascia:  No Tendon: No Muscle: No Joint: No None N/A N/A Epithelialization: Treatment Notes [name], [name] ([id number]) [id number] Page 5 of 9 Electronic Signature(s) Signed: 03/05/2023 4:53:00 PM By: Midge Aver MSN RN CNS WTA Entered By: Midge Aver on 03/04/2023 12:32:42 -------------------------------------------------------------------------------- Multi-Disciplinary Care Plan Details Patient Name: Date of Service: [name], [name] [name] 03/04/2023 12:15 PM Medical Record Number: [id number] Patient Account Number: [id number] Date of Birth/Sex: Treating RN: [birth year] (82 y.o. [name] Primary Care Honest Vanleer: Hillard Danker Other Clinician: Referring Bekka Qian: Treating Jawuan Robb/Extender: Madelynn Done Weeks in Treatment: 19 Active Inactive Wound/Skin Impairment Nursing Diagnoses: Impaired tissue integrity Knowledge deficit related to ulceration/compromised skin integrity Goals: Patient/caregiver will verbalize understanding of skin care regimen Date Initiated: 10/21/2022 Date Inactivated: 11/25/2022 Target Resolution Date: 11/21/2022 Goal Status: Met Ulcer/skin breakdown will have a volume reduction of 30% by week 4 Date Initiated: 10/21/2022 Date Inactivated: 11/25/2022 Target Resolution Date: 11/21/2022 Goal Status: Met Ulcer/skin breakdown will have a volume reduction of 50% by week 8 Date Initiated: 10/21/2022 Date Inactivated: 12/23/2022 Target Resolution Date:  12/22/2022 Goal Status: Met Ulcer/skin breakdown will have a volume reduction of 80% by week 12 Date Initiated: 10/21/2022 Date Inactivated: 01/13/2023 Target Resolution Date: 01/21/2023 Goal Status: Met Ulcer/skin breakdown will heal within 14 weeks Date Initiated: 10/21/2022 Target Resolution Date: 04/06/2023 Goal Status: Active Interventions: Assess patient/caregiver ability to obtain necessary supplies Assess patient/caregiver ability to perform ulcer/skin care regimen upon admission and as needed Assess ulceration(s) every visit Provide education on ulcer and skin care Treatment Activities: Patient referred to home care : 10/21/2022 Skin care regimen initiated : 10/21/2022 Topical wound management initiated : 10/21/2022 Notes: Electronic Signature(s) Signed: 03/05/2023 4:53:00 PM By: Midge Aver MSN RN CNS WTA Entered By: Midge Aver on 03/04/2023 12:54:54 [name], [name] ([id number]) [id number] Page 6 of 9 -------------------------------------------------------------------------------- Pain Assessment Details Patient Name: Date of Service: [name], [name] [name] 03/04/2023 12:15 PM Medical Record Number: [id number] Patient Account Number: [id number] Date of Birth/Sex: Treating RN: [birth year] (82 y.o. [name] Primary Care Manuel Lawhead: Hillard Danker Other Clinician: Referring Birgit Nowling: Treating Jakyrie Totherow/Extender: Madelynn Done Weeks in Treatment: 19 Active Problems Location of Pain Severity and Description of Pain Patient Has Paino No Site Locations Pain Management and Medication Current Pain Management: Electronic Signature(s) Signed: 03/05/2023 4:53:00 PM By: Midge Aver MSN RN CNS WTA Entered By: Midge Aver on 03/04/2023 12:26:05 -------------------------------------------------------------------------------- Patient/Caregiver Education Details Patient Name: Date of Service: [name] 11/26/2024andnbsp12:15 PM Medical Record  Number: [id number] Patient Account Number: [id number] Date of Birth/Gender: Treating RN: 1940-05-27 (82 y.o. [name] Primary Care Physician: Hillard Danker Other Clinician: Referring Physician: Treating Physician/Extender: Madelynn Done Weeks in Treatment: [address] Bellah [name] ([id number]) [id number] Page 7 of 9 Education Assessment Education Provided To: Patient Education Topics Provided Wound/Skin Impairment: Handouts: Caring for Your Ulcer Methods: Explain/Verbal Responses: State content correctly Electronic Signature(s) Signed: 03/05/2023 4:53:00 PM By: Midge Aver MSN RN CNS WTA Entered By: Midge Aver on 03/04/2023 12:55:05 -------------------------------------------------------------------------------- Wound Assessment Details Patient Name: Date of Service: [name] 03/04/2023 12:15 PM Medical Record Number: [id number] Patient Account Number: [id number] Date of Birth/Sex: Treating RN: Mar 23, 1941 (82 y.o. [name] Primary Care Mattingly Fountaine: Hillard Danker Other Clinician: Referring Narda Fundora: Treating Milady Fleener/Extender: Madelynn Done Weeks in Treatment: 19 Wound Status Wound Number: 1 Primary Etiology: Pressure Ulcer Wound Location: Midline Coccyx Wound Status: Open Wounding Event: Pressure Injury Comorbid History: Hypertension, Received Chemotherapy Date Acquired: 08/30/2022 Weeks Of Treatment: 19 Clustered  Wound: No Photos Wound Measurements Length: (cm) 1.5 Width: (cm) 0.3 Depth: (cm) 0.2 Area: (cm) 0.353 Volume: (cm) 0.071 % Reduction in Area: 99.2% % Reduction in Volume: 99.9% Epithelialization: None Wound Description Classification: Category/Stage IV Exudate Amount: Medium Exudate Type: Serosanguineous [name], [name] ([id number]) Exudate Color: red, brown Foul Odor After Cleansing: No Slough/Fibrino Yes [id number] Page 8 of 9 Wound Bed Granulation Amount: Medium  (34-66%) Exposed Structure Granulation Quality: Red, Pink Fascia Exposed: No Necrotic Amount: Medium (34-66%) Fat Layer (Subcutaneous Tissue) Exposed: Yes Tendon Exposed: No Muscle Exposed: No Joint Exposed: No Bone Exposed: Yes Treatment Notes Wound #1 (Coccyx) Wound Laterality: Midline Cleanser Normal Saline Discharge Instruction: Wash your hands with soap and water. Remove old dressing, discard into plastic bag and place into trash. Cleanse the wound with Normal Saline prior to applying a clean dressing using gauze sponges, not tissues or cotton balls. Do not scrub or use excessive force. Pat dry using gauze sponges, not tissue or cotton balls. Peri-Wound Care Topical Primary Dressing Hydrofera Blue Ready Transfer Foam, 2.5x2.5 (in/in) Discharge Instruction: Apply Hydrofera Blue Ready to wound bed as directed Cavilon No Sting Barrier Film Secondary Dressing (BORDER) Zetuvit Plus SILICONE BORDER Dressing 5x5 (in/in) Discharge Instruction: Please do not put silicone bordered dressings under wraps. Use non-bordered dressing only. Secured With Compression Wrap Compression Stockings Facilities manager) Signed: 03/05/2023 4:53:00 PM By: Midge Aver MSN RN CNS WTA Entered By: Midge Aver on 03/04/2023 12:31:49 -------------------------------------------------------------------------------- Vitals Details Patient Name: Date of Service: [name] 03/04/2023 12:15 PM Medical Record Number: [id number] Patient Account Number: [id number] Date of Birth/Sex: Treating RN: Jul 06, 1940 (82 y.o. [name] Primary Care Chanin Frumkin: Hillard Danker Other Clinician: Referring Wilkins Elpers: Treating Marcille Barman/Extender: Madelynn Done Weeks in Treatment: 19 Vital Signs Time Taken: 12:25 Temperature (F): 98.3 Pulse (bpm): 56 Respiratory Rate (breaths/min): 16 Blood Pressure (mmHg): 148/56 Reference Range: 80 - 120 mg / dl [name], [name] ([id number])  [id number] Page 9 of 9 Electronic Signature(s) Signed: 03/05/2023 4:53:00 PM By: Midge Aver MSN RN CNS WTA Entered By: Midge Aver on 03/04/2023 12:25:50

## 2023-03-11 NOTE — Telephone Encounter (Signed)
Call to patient to request BP log. Patient/son (DPR) state they are away from home and will submit over mychart.

## 2023-03-12 ENCOUNTER — Ambulatory Visit: Payer: Medicare PPO | Admitting: Internal Medicine

## 2023-03-12 ENCOUNTER — Encounter (HOSPITAL_BASED_OUTPATIENT_CLINIC_OR_DEPARTMENT_OTHER): Payer: Self-pay | Admitting: Cardiology

## 2023-03-16 ENCOUNTER — Other Ambulatory Visit: Payer: Self-pay | Admitting: Cardiology

## 2023-03-17 ENCOUNTER — Telehealth: Payer: Self-pay | Admitting: Medical Oncology

## 2023-03-17 ENCOUNTER — Encounter: Payer: Medicare PPO | Attending: Physician Assistant | Admitting: Physician Assistant

## 2023-03-17 DIAGNOSIS — L89154 Pressure ulcer of sacral region, stage 4: Secondary | ICD-10-CM | POA: Diagnosis not present

## 2023-03-17 DIAGNOSIS — C9000 Multiple myeloma not having achieved remission: Secondary | ICD-10-CM | POA: Diagnosis not present

## 2023-03-17 DIAGNOSIS — M6281 Muscle weakness (generalized): Secondary | ICD-10-CM | POA: Diagnosis not present

## 2023-03-17 DIAGNOSIS — I10 Essential (primary) hypertension: Secondary | ICD-10-CM | POA: Insufficient documentation

## 2023-03-17 NOTE — Progress Notes (Signed)
ALPERT, Maria [name] ([id number]) [id number] Page 1 of 7 Visit Report for 03/17/2023 Chief Complaint Document Details Patient Name: Date of Service: [name], [name] 03/17/2023 12:15 PM Medical Record Number: [id number] Patient Account Number: [id number] Date of Birth/Sex: Treating RN: [birth year] (82 y.o. [name] Primary Care Provider: Hillard Danker Other Clinician: Referring Provider: Treating Provider/Extender: Madelynn Done Weeks in Treatment: 21 Information Obtained from: Patient Chief Complaint Stage 4 pressure ulcer Electronic Signature(s) Signed: 03/17/2023 12:45:50 PM By: Allen Derry PA-C Entered By: Allen Derry on 03/17/2023 12:45:50 -------------------------------------------------------------------------------- HPI Details Patient Name: Date of Service: [name], [name] [name] 03/17/2023 12:15 PM Medical Record Number: [id number] Patient Account Number: [id number] Date of Birth/Sex: Treating RN: [birth year] (82 y.o. 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 Currently there is not direct bone exposure because she is now covered with granulation tissue her family has been taking care of her at home at this point although she was previously in a facility. This is something that was noted when she ended up having to go to the ER initially on Aug 30, 2022. Subsequently during hospital stent it was started that she had gentamicin applied topically along with Dakin's moistened gauze packing which has been done at home now and seems to be doing very well. She was at [facility] skilled nursing  facility for time. She did have Levaquin previous which she did do very well for her as far as get the infection under control in my opinion I think she is actually seeming to do quite well at this point. She is not having any pain which is also excellent news at this time. She is starting to get around more so she is not completely bedbound although she is a little weak she does have family that is very attentive and taking great care of her. 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 There is an area of bone that is very thinly covered. Been using Dakin's wet-to-dry dressings. She has in-home caregivers 24/7 and also home health coming out to change dressings/participate in dressing supervision although most of her dressing changes are being done by her own in-home caregivers. 11-04-2022 upon evaluation today patient appears to be doing well currently in regard to her wound. With that being said I do feel like she is making really good progress towards healing currently. I do not see any evidence of active infection locally nor systemically which is excellent news as well. No fevers, chills, [name], [name] ([id number]) [id number] Page 2 of 7 nausea, vomiting, or diarrhea. 11-12-2022 patient's wound today appears to be doing okay although to be honest I am a little concerned about the fact that the wound VAC was not put on properly. It was not bridged she was laying on the tubing and this course was not very comfortable for her. Fortunately I do not see any signs of active infection locally or systemically which is great news. No fevers, chills, nausea, vomiting, or diarrhea. 11-18-2022 upon evaluation today patient appears to be doing well  currently in regard to her wound. This is actually showing  signs of improvement wound VAC was put on much more eloquently this time compared to last I am actually very pleased. He does not like there may be some irritation around the edges of the wound I think this may be subsequent to an issue going on with infection. We can look into trying to determine what this may be and then adjusting medications as needed to take care of that infection if so. 11-25-2022 upon evaluation today patient appears to be doing well currently in regard to her wound which is actually showing signs of improvement feeling little by little. The irritation around the edges of the wound is better and the wound VAC seems to have been doing well until he ran out of the DuoDERM and apparently last time this was put on the DuoDERM was not utilized. Again I think that is something that has been extremely beneficial for keeping this wound VAC in place without any complications in the past. 8/26; patient comes in with home health changing the wound VAC 3 times a week Monday Wednesday and Friday. Per our intake nurse the tunneling area superiorly has close down nicely. Progress has been good. She did not come in with a wound VAC on, they apparently took it off so she could be a but home health is coming in later today to change it. There is some concerns about next Monday being a holiday with regards to wound VAC change by home health 12-16-2022 upon evaluation today patient appears to be doing well currently in regard to her wound. She has been tolerating the dressing changes without complication. Fortunately there does not appear to be any signs of active infection locally or systemically which is great news. No fevers, chills, nausea, vomiting, or diarrhea. 12-23-2022 upon evaluation today patient appears to be doing well currently in regard to her sacral wound. This is actually showing signs of significant improvement. Fortunately I do not see any evidence of worsening this has filled in  quite nicely and really there is no significant depth to the wound is pretty much flush. I do believe that she may benefit from discontinuation of the VAC on the right least. On hold this week and see where things stand she is in agreement with that plan. 12-30-2022 upon evaluation today patient appears to be doing well currently in regard to her wound which is actually showing signs of excellent improvement. Fortunately I do not see any evidence of worsening overall and I do believe that the patient is making great headway towards complete closure. 01-06-2023 upon evaluation today patient appears to be doing well currently in regard to her wound on the sacral area this is actually showing signs of excellent improvement and actually very pleased with where we stand. Fortunately I do not see any signs of worsening in general. 01-13-2023 upon evaluation today patient appears to be doing excellent in regards to her wound which is actually showing signs of great improvement. The hypergranulation is greatly improved compared to last week after the silver nitrate that there is a little bit of necrotic tissue to remove that something I think I can do today without any significant complication. She is in agreement with that plan. 01-20-2023 upon evaluation today patient actually appears to be making good headway towards closure. Fortunately I do not see any signs of active infection at this time which is great news. No fevers, chills, nausea, vomiting, or diarrhea. 02-03-2023 upon  evaluation today patient's wound actually appears to be doing about the same as last time I saw her. Upon discussion with the family it was apparent that the patient may not be keeping the dressing on all the time. On the days when the aide is there she does make sure that is in place but on other days it is uncertain whether or not this is actually being kept in place and it sounds like the answer may be no. Nonetheless I think that in  general she seems to be making some headway here towards closure. 02-17-2023 upon evaluation today patient's wound does not appear to be bigger in size overall but unfortunately does have some bruising around the edges of the wound which is unfortunate. Fortunately I do not see any signs of active infection locally or systemically which is great news. No fevers, chills, nausea, vomiting, or diarrhea. 02-25-2023 upon evaluation today patient appears to be doing well currently in regard to her wound this is much better than last week last week we had a lot of bruising that is definitely not we wanted to see. This week does appear to be better which is great news and in general I think that we are moving in the right direction here. There does not appear to be any signs of active infection at this time. 03-04-2023 upon evaluation today patient appears to be doing well currently in regard to her wound which is actually showing signs of improvement compared to last time I feel like she is calm actually quite a ways. Fortunately I do not see any signs of active infection locally or systemically at this time which is great news. No fevers, chills, nausea, vomiting, or diarrhea. 03-17-2023 upon evaluation today patient appears to be doing well currently in regard to her wound which is showing signs of improvement and actually very pleased with where we stand. With that being said this is gotten so small and it has gotten so well as far as healing is concerned that is draining very minimally. I think the Hydrofera Blue may not be doing quite as much for her at this point I think switching over to a collagen based dressing may be the best way to go. Electronic Signature(s) Signed: 03/17/2023 12:54:31 PM By: Allen Derry PA-C Entered By: Allen Derry on 03/17/2023 12:54:31 -------------------------------------------------------------------------------- Physical Exam Details Patient Name: Date of  Service: DENNIELLE, LEVENBERG [name] 03/17/2023 12:15 PM Medical Record Number: [id number] Patient Account Number: [id number] Date of Birth/Sex: Treating RN: 1941-03-19 (82 y.o. Owens Loffler, Inette [name] ([id number].pdf Page 3 of 7 Primary Care Provider: Hillard Danker Other Clinician: Referring Provider: Treating Provider/Extender: Madelynn Done Weeks in Treatment: 21 Constitutional Well-nourished and well-hydrated in no acute distress. Respiratory normal breathing without difficulty. Psychiatric this patient is able to make decisions and demonstrates good insight into disease process. Alert and Oriented x 3. pleasant and cooperative. Notes Upon inspection patient's wound bed actually showed signs of good granulation and epithelization at this point. Fortunately I do not see any signs of active infection at this time which is good news and in general I think that she is very close to complete resolution I would recommend switching over to collagen as opposed to continuing at this point with the [facility] based on what I see. Electronic Signature(s) Signed: 03/17/2023 12:56:20 PM By: Allen Derry PA-C Entered By: Allen Derry on 03/17/2023 12:56:20 -------------------------------------------------------------------------------- Physician Orders Details Patient Name: Date of Service: Lafosse, A [name]  03/17/2023 12:15 PM Medical Record Number: [id number] Patient Account Number: [id number] Date of Birth/Sex: Treating RN: 11-Mar-1941 (82 y.o. [name] Primary Care Provider: Hillard Danker Other Clinician: Referring Provider: Treating Provider/Extender: Madelynn Done Weeks in Treatment: 21 The following information was scribed by: Midge Aver The information was scribed for: Allen Derry Verbal / Phone Orders: No Diagnosis Coding ICD-10 Coding Code Description L89.154 Pressure ulcer of sacral region, stage 4 M62.81 Muscle weakness  (generalized) C90.00 Multiple myeloma not having achieved remission Follow-up Appointments Return Appointment in 2 weeks. Home Health DISCONTINUE Home Health for Wound Care. - FAMILY WILL DO DRESSING CHANGES. Place a small piece of collagen after wetting into the wound and cover with a border foam dressing Bathing/ Shower/ Hygiene May shower; gently cleanse wound with antibacterial soap, rinse and pat dry prior to dressing wounds No tub bath. Off-Loading Low air-loss mattress (Group 2) Turn and reposition every 2 hours Wound Treatment Wound #1 - Coccyx Wound Laterality: Midline Cleanser: Normal Saline 3 x Per Week/30 Days Valvano, Brandelyn [name] ([id number]) [id number] Page 4 of 7 Discharge Instructions: Wash your hands with soap and water. Remove old dressing, discard into plastic bag and place into trash. Cleanse the wound with Normal Saline prior to applying a clean dressing using gauze sponges, not tissues or cotton balls. Do not scrub or use excessive force. Pat dry using gauze sponges, not tissue or cotton balls. Prim Dressing: Promogran Matrix 4.34 (in) 3 x Per Week/30 Days ary Discharge Instructions: Moisten w/normal saline or sterile water; Cover wound as directed. Do not remove from wound bed. Prim Dressing: Cavilon No Sting Barrier Film ary 3 x Per Week/30 Days Secondary Dressing: (BORDER) Zetuvit Plus SILICONE BORDER Dressing 5x5 (in/in) (Generic) 3 x Per Week/30 Days Discharge Instructions: Please do not put silicone bordered dressings under wraps. Use non-bordered dressing only. Electronic Signature(s) Signed: 03/17/2023 4:09:48 PM By: [name] Pore Signed: 03/18/2023 4:50:41 PM By: Midge Aver MSN RN CNS WTA Signed: 03/19/2023 7:54:12 PM By: Allen Derry PA-C Entered By: Midge Aver on 03/17/2023 12:51:39 -------------------------------------------------------------------------------- Problem List Details Patient Name: Date of Service: DAELYNN, AMINOV [name]  03/17/2023 12:15 PM Medical Record Number: [id number] Patient Account Number: [id number] Date of Birth/Sex: Treating RN: 1940-12-31 (82 y.o. [name] Primary Care Provider: Hillard Danker Other Clinician: Referring Provider: Treating Provider/Extender: Madelynn Done Weeks in Treatment: 21 Active Problems ICD-10 Encounter Code Description Active Date MDM Diagnosis L89.154 Pressure ulcer of sacral region, stage 4 10/21/2022 No Yes M62.81 Muscle weakness (generalized) 10/21/2022 No Yes C90.00 Multiple myeloma not having achieved remission 10/21/2022 No Yes Inactive Problems Resolved Problems Electronic Signature(s) Signed: 03/17/2023 12:45:46 PM By: Allen Derry PA-C Entered By: Allen Derry on 03/17/2023 12:45:46 Mangus, Mekiah [name] ([id number]) [id number] Page 5 of 7 -------------------------------------------------------------------------------- Progress Note Details Patient Name: Date of Service: CORNELLIA, WEHRLY [name] 03/17/2023 12:15 PM Medical Record Number: [id number] Patient Account Number: [id number] Date of Birth/Sex: Treating RN: 04/09/1940 (82 y.o. 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 Currently there is not direct bone exposure because she is now covered with granulation tissue her family has been taking care of her  at home at this point although she was previously in a facility. This is something that was noted when she ended up having to go to the ER initially on Aug 30, 2022. Subsequently during hospital  stent it was started that she had gentamicin applied topically along with Dakin's moistened gauze packing which has been done at home now and seems to be doing very well. She was at [facility] skilled nursing facility for time. She did have Levaquin previous which she did do very well for her as far as get the infection under control in my opinion I think she is actually seeming to do quite well at this point. She is not having any pain which is also excellent news at this time. She is starting to get around more so she is not completely bedbound although she is a little weak she does have family that is very attentive and taking great care of her. 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 There is an area of bone that is very thinly covered. Been using Dakin's wet-to-dry dressings. She has in-home caregivers 24/7 and also home health coming out to change dressings/participate in dressing supervision although most of her dressing changes are being done by her own in-home caregivers. 11-04-2022 upon evaluation today patient appears to be doing well currently in regard to her wound. With that being said I do feel like she is making really good progress towards healing currently. I do not see any evidence of active infection locally nor systemically which is excellent news as well. No fevers, chills, nausea, vomiting, or diarrhea. 11-12-2022 patient's wound today appears to be doing okay although to be honest I am a little concerned about the fact that the wound VAC was not put on properly. It was not bridged she was laying on the tubing and this course was not very comfortable for her. Fortunately I do not see any signs of active infection locally or systemically which is great news. No fevers, chills, nausea, vomiting, or  diarrhea. 11-18-2022 upon evaluation today patient appears to be doing well currently in regard to her wound. This is actually showing signs of improvement wound VAC was put on much more eloquently this time compared to last I am actually very pleased. He does not like there may be some irritation around the edges of the wound I think this may be subsequent to an issue going on with infection. We can look into trying to determine what this may be and then adjusting medications as needed to take care of that infection if so. 11-25-2022 upon evaluation today patient appears to be doing well currently in regard to her wound which is actually showing signs of improvement feeling little by little. The irritation around the edges of the wound is better and the wound VAC seems to have been doing well until he ran out of the DuoDERM and apparently last time this was put on the DuoDERM was not utilized. Again I think that is something that has been extremely beneficial for keeping this wound VAC in place without any complications in the past. 8/26; patient comes in with home health changing the wound VAC 3 times a week Monday Wednesday and Friday. Per our intake nurse the tunneling area superiorly has close down nicely. Progress has been good. She did not come in with  a wound VAC on, they apparently took it off so she could be a but home health is coming in later today to change it. There is some concerns about next Monday being a holiday with regards to wound VAC change by home health 12-16-2022 upon evaluation today patient appears to be doing well currently in regard to her wound. She has been tolerating the dressing changes without complication. Fortunately there does not appear to be any signs of active infection locally or systemically which is great news. No fevers, chills, nausea, vomiting, or diarrhea. 12-23-2022 upon evaluation today patient appears to be doing well currently in regard to her sacral  wound. This is actually showing signs of significant improvement. Fortunately I do not see any evidence of worsening this has filled in quite nicely and really there is no significant depth to the wound is pretty much flush. I do believe that she may benefit from discontinuation of the VAC on the right least. On hold this week and see where things stand she is in agreement with that plan. 12-30-2022 upon evaluation today patient appears to be doing well currently in regard to her wound which is actually showing signs of excellent improvement. Fortunately I do not see any evidence of worsening overall and I do believe that the patient is making great headway towards complete closure. 01-06-2023 upon evaluation today patient appears to be doing well currently in regard to her wound on the sacral area this is actually showing signs of excellent improvement and actually very pleased with where we stand. Fortunately I do not see any signs of worsening in general. 01-13-2023 upon evaluation today patient appears to be doing excellent in regards to her wound which is actually showing signs of great improvement. The hypergranulation is greatly improved compared to last week after the silver nitrate that there is a little bit of necrotic tissue to remove that something I think I can do today without any significant complication. She is in agreement with that plan. 01-20-2023 upon evaluation today patient actually appears to be making good headway towards closure. Fortunately I do not see any signs of active infection at this time which is great news. No fevers, chills, nausea, vomiting, or diarrhea. ZAMOR, Eirene [name] ([id number]) [id number] Page 6 of 7 02-03-2023 upon evaluation today patient's wound actually appears to be doing about the same as last time I saw her. Upon discussion with the family it was apparent that the patient may not be keeping the dressing on all the time. On the  days when the aide is there she does make sure that is in place but on other days it is uncertain whether or not this is actually being kept in place and it sounds like the answer may be no. Nonetheless I think that in general she seems to be making some headway here towards closure. 02-17-2023 upon evaluation today patient's wound does not appear to be bigger in size overall but unfortunately does have some bruising around the edges of the wound which is unfortunate. Fortunately I do not see any signs of active infection locally or systemically which is great news. No fevers, chills, nausea, vomiting, or diarrhea. 02-25-2023 upon evaluation today patient appears to be doing well currently in regard to her wound this is much better than last week last week we had a lot of bruising that is definitely not we wanted to see. This week does appear to be better which is great news and in general I think that  we are moving in the right direction here. There does not appear to be any signs of active infection at this time. 03-04-2023 upon evaluation today patient appears to be doing well currently in regard to her wound which is actually showing signs of improvement compared to last time I feel like she is calm actually quite a ways. Fortunately I do not see any signs of active infection locally or systemically at this time which is great news. No fevers, chills, nausea, vomiting, or diarrhea. 03-17-2023 upon evaluation today patient appears to be doing well currently in regard to her wound which is showing signs of improvement and actually very pleased with where we stand. With that being said this is gotten so small and it has gotten so well as far as healing is concerned that is draining very minimally. I think the Hydrofera Blue may not be doing quite as much for her at this point I think switching over to a collagen based dressing may be the best way to go. Objective Constitutional Well-nourished and  well-hydrated in no acute distress. Vitals Time Taken: 12:28 PM, Temperature: 98.2 F, Pulse: 59 bpm, Respiratory Rate: 18 breaths/min, Blood Pressure: 148/67 mmHg. Respiratory normal breathing without difficulty. Psychiatric this patient is able to make decisions and demonstrates good insight into disease process. Alert and Oriented x 3. pleasant and cooperative. General Notes: Upon inspection patient's wound bed actually showed signs of good granulation and epithelization at this point. Fortunately I do not see any signs of active infection at this time which is good news and in general I think that she is very close to complete resolution I would recommend switching over to collagen as opposed to continuing at this point with the [facility] [facility] based on what I see. Integumentary (Hair, Skin) Wound #1 status is Open. Original cause of wound was Pressure Injury. The date acquired was: 08/30/2022. The wound has been in treatment 21 weeks. The wound is located on the Midline Coccyx. The wound measures 0.8cm length x 0.2cm width x 0.2cm depth; 0.126cm^2 area and 0.025cm^3 volume. There is bone and Fat Layer (Subcutaneous Tissue) exposed. There is a medium amount of serosanguineous drainage noted. There is medium (34-66%) red, pink granulation within the wound bed. There is a medium (34-66%) amount of necrotic tissue within the wound bed. Assessment Active Problems ICD-10 Pressure ulcer of sacral region, stage 4 Muscle weakness (generalized) Multiple myeloma not having achieved remission Plan Follow-up Appointments: Return Appointment in 2 weeks. Home Health: DISCONTINUE Home Health for Wound Care. - FAMILY WILL DO DRESSING CHANGES. Place a small piece of collagen after wetting into the wound and cover with a border foam dressing Bathing/ Shower/ Hygiene: May shower; gently cleanse wound with antibacterial soap, rinse and pat dry prior to dressing wounds No tub bath. Off-Loading: Low  air-loss mattress (Group 2) Turn and reposition every 2 hours WOUND #1: - Coccyx Wound Laterality: Midline Cleanser: Normal Saline 3 x Per Week/30 Days Discharge Instructions: Wash your hands with soap and water. Remove old dressing, discard into plastic bag and place into trash. Cleanse the [place], Orlanda [name] ([id number]) [id number] Page 7 of 7 wound with Normal Saline prior to applying a clean dressing using gauze sponges, not tissues or cotton balls. Do not scrub or use excessive force. Pat dry using gauze sponges, not tissue or cotton balls. Prim Dressing: Promogran Matrix 4.34 (in) 3 x Per Week/30 Days ary Discharge Instructions: Moisten w/normal saline or sterile water; Cover wound as directed. Do not remove  from wound bed. Prim Dressing: Cavilon No Sting Barrier Film 3 x Per Week/30 Days ary Secondary Dressing: (BORDER) Zetuvit Plus SILICONE BORDER Dressing 5x5 (in/in) (Generic) 3 x Per Week/30 Days Discharge Instructions: Please do not put silicone bordered dressings under wraps. Use non-bordered dressing only. 1. Patient's wound currently showed signs of good granulation and epithelization at this point. Fortunately I do not see any signs of active infection at this point. 2. I am also can recommend the patient should continue with the bordered foam dressings to cover. 3. I am also going to recommend that we switch over to collagen to see if we can finish out the last portion of this wound. The patient is in agreement with that plan we will get a see how things do with the collagen over the next week. We will see patient back for reevaluation in 2 weeks here in the clinic. If anything worsens or changes patient will contact our office for additional recommendations. Electronic Signature(s) Signed: 03/17/2023 5:33:03 PM By: Allen Derry PA-C Entered By: Allen Derry on 03/17/2023  17:33:03 -------------------------------------------------------------------------------- SuperBill Details Patient Name: Date of Service: [name] 03/17/2023 Medical Record Number: [id number] Patient Account Number: [id number] Date of Birth/Sex: Treating RN: [id number] (82 y.o. [name] Primary Care Provider: Hillard Danker Other Clinician: Referring Provider: Treating Provider/Extender: Madelynn Done Weeks in Treatment: 21 Diagnosis Coding ICD-10 Codes Code Description L89.154 Pressure ulcer of sacral region, stage 4 M62.81 Muscle weakness (generalized) C90.00 Multiple myeloma not having achieved remission Facility Procedures : 7 CPT4 Code: [id number] Description: 99213 - WOUND CARE VISIT-LEV 3 EST PT Modifier: Quantity: 1 Physician Procedures : CPT4 Code Description Modifier [id number] - WC PHYS LEVEL 3 - EST PT ICD-10 Diagnosis Description L89.154 Pressure ulcer of sacral region, stage 4 M62.81 Muscle weakness (generalized) C90.00 Multiple myeloma not having achieved remission Quantity: 1 Electronic Signature(s) Signed: 03/17/2023 5:33:57 PM By: Allen Derry PA-C Previous Signature: 03/17/2023 4:09:48 PM Version By: [name] Pore Entered By: Allen Derry on 03/17/2023 17:33:56

## 2023-03-17 NOTE — Telephone Encounter (Signed)
Son confirmed appt on 12/24. I told him I will see about moving her appts to jan 21 or 22

## 2023-03-24 ENCOUNTER — Other Ambulatory Visit: Payer: Self-pay

## 2023-03-24 DIAGNOSIS — C9000 Multiple myeloma not having achieved remission: Secondary | ICD-10-CM

## 2023-03-24 MED ORDER — LENALIDOMIDE 15 MG PO CAPS: 15.0000 mg | ORAL_CAPSULE | Freq: Every day | ORAL | 0 refills | Status: DC

## 2023-03-25 ENCOUNTER — Other Ambulatory Visit: Payer: Self-pay | Admitting: Physician Assistant

## 2023-03-25 ENCOUNTER — Inpatient Hospital Stay: Payer: Medicare PPO | Attending: Internal Medicine

## 2023-03-25 DIAGNOSIS — E876 Hypokalemia: Secondary | ICD-10-CM

## 2023-03-25 DIAGNOSIS — Z79899 Other long term (current) drug therapy: Secondary | ICD-10-CM | POA: Insufficient documentation

## 2023-03-25 DIAGNOSIS — C9000 Multiple myeloma not having achieved remission: Secondary | ICD-10-CM | POA: Insufficient documentation

## 2023-03-25 DIAGNOSIS — Z7961 Long term (current) use of immunomodulator: Secondary | ICD-10-CM | POA: Insufficient documentation

## 2023-03-25 DIAGNOSIS — Z79624 Long term (current) use of inhibitors of nucleotide synthesis: Secondary | ICD-10-CM | POA: Diagnosis not present

## 2023-03-25 DIAGNOSIS — C37 Malignant neoplasm of thymus: Secondary | ICD-10-CM

## 2023-03-25 DIAGNOSIS — Z7952 Long term (current) use of systemic steroids: Secondary | ICD-10-CM | POA: Diagnosis not present

## 2023-03-25 DIAGNOSIS — Z5112 Encounter for antineoplastic immunotherapy: Secondary | ICD-10-CM | POA: Insufficient documentation

## 2023-03-25 LAB — CMP (CANCER CENTER ONLY)
ALT: 27 U/L (ref 0–44)
AST: 17 U/L (ref 15–41)
Albumin: 3.8 g/dL (ref 3.5–5.0)
Alkaline Phosphatase: 107 U/L (ref 38–126)
Anion gap: 6 (ref 5–15)
BUN: 19 mg/dL (ref 8–23)
CO2: 29 mmol/L (ref 22–32)
Calcium: 8.9 mg/dL (ref 8.9–10.3)
Chloride: 109 mmol/L (ref 98–111)
Creatinine: 0.76 mg/dL (ref 0.44–1.00)
GFR, Estimated: >60 mL/min (ref >60)
Glucose, Bld: 110 mg/dL — ABNORMAL HIGH (ref 70–99)
Potassium: 3.1 mmol/L — ABNORMAL LOW (ref 3.5–5.1)
Sodium: 144 mmol/L (ref 135–145)
Total Bilirubin: 0.4 mg/dL (ref <1.2)
Total Protein: 5.4 g/dL — ABNORMAL LOW (ref 6.5–8.1)

## 2023-03-25 LAB — CBC WITH DIFFERENTIAL (CANCER CENTER ONLY)
Abs Immature Granulocytes: 0.06 10*3/uL (ref 0.00–0.07)
Basophils Absolute: 0.1 10*3/uL (ref 0.0–0.1)
Basophils Relative: 1 %
Eosinophils Absolute: 0.4 10*3/uL (ref 0.0–0.5)
Eosinophils Relative: 3 %
HCT: 37 % (ref 36.0–46.0)
Hemoglobin: 12.4 g/dL (ref 12.0–15.0)
Immature Granulocytes: 1 %
Lymphocytes Relative: 20 %
Lymphs Abs: 2.7 10*3/uL (ref 0.7–4.0)
MCH: 33.4 pg (ref 26.0–34.0)
MCHC: 33.5 g/dL (ref 30.0–36.0)
MCV: 99.7 fL (ref 80.0–100.0)
Monocytes Absolute: 1.7 10*3/uL — ABNORMAL HIGH (ref 0.1–1.0)
Monocytes Relative: 13 %
Neutro Abs: 8.4 10*3/uL — ABNORMAL HIGH (ref 1.7–7.7)
Neutrophils Relative %: 62 %
Platelet Count: 170 10*3/uL (ref 150–400)
RBC: 3.71 MIL/uL — ABNORMAL LOW (ref 3.87–5.11)
RDW: 14.6 % (ref 11.5–15.5)
WBC Count: 13.3 10*3/uL — ABNORMAL HIGH (ref 4.0–10.5)
nRBC: 0 % (ref 0.0–0.2)

## 2023-03-25 LAB — LACTATE DEHYDROGENASE: LDH: 162 U/L (ref 98–192)

## 2023-03-25 MED ORDER — POTASSIUM CHLORIDE CRYS ER 20 MEQ PO TBCR: 20.0000 meq | EXTENDED_RELEASE_TABLET | Freq: Every day | ORAL | 0 refills | Status: DC

## 2023-03-26 ENCOUNTER — Telehealth: Payer: Self-pay

## 2023-03-26 ENCOUNTER — Telehealth: Payer: Self-pay | Admitting: Medical Oncology

## 2023-03-26 DIAGNOSIS — C9000 Multiple myeloma not having achieved remission: Secondary | ICD-10-CM

## 2023-03-26 LAB — IGG, IGA, IGM
IgA: 25 mg/dL — ABNORMAL LOW (ref 64–422)
IgG (Immunoglobin G), Serum: 300 mg/dL — ABNORMAL LOW (ref 586–1602)
IgM (Immunoglobulin M), Srm: 14 mg/dL — ABNORMAL LOW (ref 26–217)

## 2023-03-26 LAB — KAPPA/LAMBDA LIGHT CHAINS
Kappa free light chain: 2.9 mg/L — ABNORMAL LOW (ref 3.3–19.4)
Kappa, lambda light chain ratio: 0.88 (ref 0.26–1.65)
Lambda free light chains: 3.3 mg/L — ABNORMAL LOW (ref 5.7–26.3)

## 2023-03-26 MED ORDER — LENALIDOMIDE 15 MG PO CAPS: 15.0000 mg | ORAL_CAPSULE | Freq: Every day | ORAL | 0 refills | Status: DC

## 2023-03-26 NOTE — Telephone Encounter (Addendum)
LVM informing patient of below.  Asked for a return call if any questions.   ----- Message from Cassandra L Heilingoetter sent at 03/25/2023  4:07 PM EST ----- Can you let her know her potassium was a little low. I sent in potassium to take once a day for 1 week ----- Message ----- From: Interface, [facility] [place] Sent: 03/25/2023   2:11 PM EST To: Cassandra L Heilingoetter, PA-C

## 2023-03-26 NOTE — Telephone Encounter (Signed)
Rerouted Revlimid to [facility].

## 2023-03-27 LAB — BETA 2 MICROGLOBULIN, SERUM: Beta-2 Microglobulin: 1.9 mg/L (ref 0.6–2.4)

## 2023-03-28 ENCOUNTER — Ambulatory Visit: Payer: Medicare PPO | Admitting: Cardiology

## 2023-03-31 ENCOUNTER — Encounter: Payer: Medicare PPO | Admitting: Physician Assistant

## 2023-03-31 DIAGNOSIS — I10 Essential (primary) hypertension: Secondary | ICD-10-CM | POA: Diagnosis not present

## 2023-03-31 DIAGNOSIS — M6281 Muscle weakness (generalized): Secondary | ICD-10-CM | POA: Diagnosis not present

## 2023-03-31 DIAGNOSIS — L89154 Pressure ulcer of sacral region, stage 4: Secondary | ICD-10-CM | POA: Diagnosis not present

## 2023-03-31 DIAGNOSIS — C9000 Multiple myeloma not having achieved remission: Secondary | ICD-10-CM | POA: Diagnosis not present

## 2023-04-01 ENCOUNTER — Other Ambulatory Visit: Payer: Self-pay | Admitting: Internal Medicine

## 2023-04-01 ENCOUNTER — Inpatient Hospital Stay: Payer: Medicare PPO

## 2023-04-01 ENCOUNTER — Inpatient Hospital Stay (HOSPITAL_BASED_OUTPATIENT_CLINIC_OR_DEPARTMENT_OTHER): Payer: Medicare PPO | Admitting: Internal Medicine

## 2023-04-01 VITALS — BP 142/46 | HR 56 | Temp 97.9°F | Resp 15 | Ht 61.5 in | Wt 141.7 lb

## 2023-04-01 DIAGNOSIS — Z5112 Encounter for antineoplastic immunotherapy: Secondary | ICD-10-CM | POA: Diagnosis not present

## 2023-04-01 DIAGNOSIS — C9000 Multiple myeloma not having achieved remission: Secondary | ICD-10-CM

## 2023-04-01 DIAGNOSIS — Z79899 Other long term (current) drug therapy: Secondary | ICD-10-CM | POA: Diagnosis not present

## 2023-04-01 DIAGNOSIS — Z7952 Long term (current) use of systemic steroids: Secondary | ICD-10-CM | POA: Diagnosis not present

## 2023-04-01 DIAGNOSIS — Z7961 Long term (current) use of immunomodulator: Secondary | ICD-10-CM | POA: Diagnosis not present

## 2023-04-01 DIAGNOSIS — Z79624 Long term (current) use of inhibitors of nucleotide synthesis: Secondary | ICD-10-CM | POA: Diagnosis not present

## 2023-04-01 LAB — CBC WITH DIFFERENTIAL (CANCER CENTER ONLY)
Abs Immature Granulocytes: 0.02 10*3/uL (ref 0.00–0.07)
Basophils Absolute: 0.1 10*3/uL (ref 0.0–0.1)
Basophils Relative: 1 %
Eosinophils Absolute: 0.4 10*3/uL (ref 0.0–0.5)
Eosinophils Relative: 5 %
HCT: 36.2 % (ref 36.0–46.0)
Hemoglobin: 11.9 g/dL — ABNORMAL LOW (ref 12.0–15.0)
Immature Granulocytes: 0 %
Lymphocytes Relative: 24 %
Lymphs Abs: 1.8 10*3/uL (ref 0.7–4.0)
MCH: 32.5 pg (ref 26.0–34.0)
MCHC: 32.9 g/dL (ref 30.0–36.0)
MCV: 98.9 fL (ref 80.0–100.0)
Monocytes Absolute: 0.8 10*3/uL (ref 0.1–1.0)
Monocytes Relative: 11 %
Neutro Abs: 4.3 10*3/uL (ref 1.7–7.7)
Neutrophils Relative %: 59 %
Platelet Count: 122 10*3/uL — ABNORMAL LOW (ref 150–400)
RBC: 3.66 MIL/uL — ABNORMAL LOW (ref 3.87–5.11)
RDW: 14.7 % (ref 11.5–15.5)
WBC Count: 7.3 10*3/uL (ref 4.0–10.5)
nRBC: 0 % (ref 0.0–0.2)

## 2023-04-01 LAB — CMP (CANCER CENTER ONLY)
ALT: 28 U/L (ref 0–44)
AST: 20 U/L (ref 15–41)
Albumin: 3.8 g/dL (ref 3.5–5.0)
Alkaline Phosphatase: 91 U/L (ref 38–126)
Anion gap: 6 (ref 5–15)
BUN: 21 mg/dL (ref 8–23)
CO2: 31 mmol/L (ref 22–32)
Calcium: 8.8 mg/dL — ABNORMAL LOW (ref 8.9–10.3)
Chloride: 109 mmol/L (ref 98–111)
Creatinine: 0.78 mg/dL (ref 0.44–1.00)
GFR, Estimated: >60 mL/min (ref >60)
Glucose, Bld: 92 mg/dL (ref 70–99)
Potassium: 3.4 mmol/L — ABNORMAL LOW (ref 3.5–5.1)
Sodium: 146 mmol/L — ABNORMAL HIGH (ref 135–145)
Total Bilirubin: 0.7 mg/dL (ref <1.2)
Total Protein: 5.6 g/dL — ABNORMAL LOW (ref 6.5–8.1)

## 2023-04-01 LAB — LACTATE DEHYDROGENASE: LDH: 163 U/L (ref 98–192)

## 2023-04-01 MED ORDER — DARATUMUMAB-HYALURONIDASE-FIHJ 1800-30000 MG-UT/15ML ~~LOC~~ SOLN
1800.0000 mg | Freq: Once | SUBCUTANEOUS | Status: AC
Start: 2023-04-01 — End: 2023-04-01
  Administered 2023-04-01: 1800 mg via SUBCUTANEOUS
  Filled 2023-04-01: qty 15

## 2023-04-01 MED ORDER — ACETAMINOPHEN 325 MG PO TABS
650.0000 mg | ORAL_TABLET | Freq: Once | ORAL | Status: AC
  Administered 2023-04-01: 650 mg via ORAL
  Filled 2023-04-01: qty 2

## 2023-04-01 MED ORDER — DEXAMETHASONE 4 MG PO TABS
20.0000 mg | ORAL_TABLET | Freq: Once | ORAL | Status: AC
Start: 2023-04-01 — End: 2023-04-01
  Administered 2023-04-01: 20 mg via ORAL
  Filled 2023-04-01: qty 5

## 2023-04-01 MED ORDER — DIPHENHYDRAMINE HCL 25 MG PO CAPS
50.0000 mg | ORAL_CAPSULE | Freq: Once | ORAL | Status: AC
  Administered 2023-04-01: 50 mg via ORAL
  Filled 2023-04-01: qty 2

## 2023-04-01 MED ORDER — ZOLEDRONIC ACID 4 MG/100ML IV SOLN
4.0000 mg | Freq: Once | INTRAVENOUS | Status: AC
Start: 2023-04-01 — End: 2023-04-01
  Administered 2023-04-01: 4 mg via INTRAVENOUS
  Filled 2023-04-01: qty 100

## 2023-04-01 MED ORDER — SODIUM CHLORIDE 0.9 % IV SOLN: Freq: Once | INTRAVENOUS | Status: AC

## 2023-04-01 NOTE — Progress Notes (Signed)
[facility] [phone]   [phone]  OFFICE PROGRESS NOTE  [name], MD 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 Dorris Fetch on January 28, 2022.    CURRENT THERAPY:  1) Starting systemic therapy with Revlimid 21 days on 7 days off, Darzalex, and 20 mg of Decadron weekly.  First dose expected next week on 09/11/2022.  Status post 4 cycles.  2) Monthly Zometa injections for 6 to 12 months, followed by every 3 months thereon after once dental clearance is obtained.  INTERVAL HISTORY: [name] 82 y.o. female returns to the clinic today for follow-up visit accompanied by her son [name] and her granddaughter [name] Discussed the use of AI scribe software for clinical note transcription with the patient, who gave verbal consent to proceed.  History of Present Illness   The patient, an 82 year old with a history of multiple myeloma (IgG subtype) and stage 1 thymoma, presents for the beginning of her eighth cycle of treatment with daratumumab, Revlimid, and Decadron. The patient's thymoma was surgically removed in October 2023.  The patient reports experiencing daily diarrhea, typically occurring in the mornings. The frequency is once a day, and it has been ongoing since her last visit four weeks ago. The patient has not been taking Imodium for this symptom. The diarrhea has been impacting her eating habits, with the patient often avoiding food in the morning due to the subsequent bowel movement.  The patient's treatment was delayed by a week due to a delay in receiving Revlimid. The patient is  currently finishing the three weeks of treatment and is due to start a week off. The patient's blood work has been consistently good throughout the treatment.  The patient also has a history of receiving Zometa for bone health, the frequency of which may be reduced to every three months.       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 by echo 10/2021   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/2015   Carotid artery stenosis    < 50% stenosis bilaterally by doppler 07/2016   Coronary artery disease 2008   Coronary Ca score of 331 with minimal multivessel plaque < 25% stenosis by coronary CTA 8/23   Heart murmur    per pt   Hypercholesteremia    Hypertension    Obesity    Osteopenia    declines treatment   Pneumonia 1995   Pulmonary HTN (HCC)    mild to moderate by echo 7/23 with PASP   Shoulder pain    Due to arthritis   Vitamin D insufficiency     ALLERGIES:  is allergic to crestor [rosuvastatin calcium], lactose, lipitor [atorvastatin], pravastatin, simvastatin, tramadol, zetia [ezetimibe], codeine, penicillins, sulfa antibiotics, and vancomycin.  MEDICATIONS:  Current Outpatient Medications  Medication Sig Dispense Refill  acetaminophen (TYLENOL) 325 MG tablet Take 2 tablets (650 mg total) by mouth every 8 (eight) hours as needed.     acyclovir (ZOVIRAX) 200 MG capsule Take 1 capsule (200 mg total) by mouth 2 (two) times daily. 30 capsule 3   allopurinol (ZYLOPRIM) 100 MG tablet Take 1 tablet (100 mg total) by mouth 2 (two) times daily. 60 tablet 2   daratumumab-hyaluronidase-fihj (DARZALEX FASPRO) 1800-30000 MG-UT/15ML SOLN Inject 1,800 mg into the skin once.     dexamethasone (DECADRON) 4 MG tablet Please take 5 tablets  (20 mg) weekly on the days of treatment 40 tablet 3   diphenoxylate-atropine (LOMOTIL) 2.5-0.025 MG tablet Take 1 tablet by mouth 4 (four) times daily as needed for diarrhea or loose stools. 30 tablet 0   lenalidomide (REVLIMID) 15 MG capsule Take 1 capsule (15 mg total) by mouth daily. Adult female not of childbearing potential. 21 capsule 0   losartan (COZAAR) 25 MG tablet Take 1 tablet by mouth once daily 30 tablet 8   Multiple Vitamin (MULTIVITAMIN WITH MINERALS) TABS tablet Take 1 tablet by mouth daily.     naloxone (NARCAN) nasal spray 4 mg/0.1 mL      oxycodone (OXY-IR) 5 MG capsule Take 5 mg by mouth every 6 (six) hours as needed for pain.     potassium chloride SA (KLOR-CON M) 20 MEQ tablet Take 1 tablet (20 mEq total) by mouth daily. 7 tablet 0   No current facility-administered medications for this visit.    SURGICAL HISTORY:  Past Surgical History:  Procedure Laterality Date   AORTIC VALVE REPLACEMENT N/A 10/14/2013   Procedure: AORTIC VALVE REPLACEMENT (AVR);  Surgeon: Alleen Borne, MD;  Location: [facility] OR;  Service: Open Heart Surgery;  Laterality: N/A;   CARDIAC CATHETERIZATION     CATARACT EXTRACTION, BILATERAL     CHOLECYSTECTOMY  04/08/1988   ESOPHAGOGASTRODUODENOSCOPY (EGD) WITH PROPOFOL N/A 09/05/2022   Procedure: ESOPHAGOGASTRODUODENOSCOPY (EGD) WITH PROPOFOL;  Surgeon: Kathi Der, MD;  Location: [facility] ENDOSCOPY;  Service: Gastroenterology;  Laterality: N/A;   INTRAOPERATIVE TRANSESOPHAGEAL ECHOCARDIOGRAM N/A 10/14/2013   Procedure: INTRAOPERATIVE TRANSESOPHAGEAL ECHOCARDIOGRAM;  Surgeon: Alleen Borne, MD;  Location: [facility] OR;  Service: Open Heart Surgery;  Laterality: N/A;   IVC FILTER INSERTION N/A 09/06/2022   Procedure: IVC FILTER INSERTION;  Surgeon: Leonie Douglas, MD;  Location: [facility] INVASIVE CV LAB;  Service: Cardiovascular;  Laterality: N/A;   KYPHOPLASTY Bilateral 03/27/2022   Procedure: KYPHOPLASTY AND BIOPSY THORACIC ELEVEN;  Surgeon: Lisbeth Renshaw,  MD;  Location: [facility] OR;  Service: Neurosurgery;  Laterality: Bilateral;   LEFT AND RIGHT HEART CATHETERIZATION WITH CORONARY ANGIOGRAM N/A 09/16/2013   Procedure: LEFT AND RIGHT HEART CATHETERIZATION WITH CORONARY ANGIOGRAM;  Surgeon: Quintella Reichert, MD;  Location: [facility] CATH LAB;  Service: Cardiovascular;  Laterality: N/A;   TONSILLECTOMY      REVIEW OF SYSTEMS:  Constitutional: positive for fatigue Eyes: negative Ears, nose, mouth, throat, and face: negative Respiratory: negative Cardiovascular: negative Gastrointestinal: positive for diarrhea Genitourinary:negative Integument/breast: negative Hematologic/lymphatic: negative Musculoskeletal:negative Neurological: negative Behavioral/Psych: negative Endocrine: negative Allergic/Immunologic: negative   PHYSICAL EXAMINATION: General appearance: alert, cooperative, fatigued, and no distress Head: Normocephalic, without obvious abnormality, atraumatic Neck: no adenopathy, no JVD, supple, symmetrical, trachea midline, and thyroid not enlarged, symmetric, no tenderness/mass/nodules Lymph nodes: Cervical, supraclavicular, and axillary nodes normal. Resp: clear to auscultation bilaterally Back: symmetric, no curvature. ROM normal. No CVA tenderness. Cardio: regular rate and rhythm, S1, S2 normal, no murmur, click, rub or gallop GI: soft, non-tender; bowel  sounds normal; no masses,  no organomegaly Extremities: extremities normal, atraumatic, no cyanosis or edema Neurologic: Alert and oriented X 3, normal strength and tone. Normal symmetric reflexes. Normal coordination and gait  ECOG PERFORMANCE STATUS: 1 - Symptomatic but completely ambulatory  Blood pressure (!) 142/46, pulse (!) 56, temperature 97.9 F (36.6 C), temperature source Temporal, resp. rate 15, height 5' 1.5" (1.562 m), weight 141 lb 11.2 oz (64.3 kg), SpO2 99%.  LABORATORY DATA: Lab Results  Component Value Date   WBC 7.3 04/01/2023   HGB 11.9 (L) 04/01/2023   HCT 36.2  04/01/2023   MCV 98.9 04/01/2023   PLT 122 (L) 04/01/2023      Chemistry      Component Value Date/Time   NA 144 03/25/2023 1340   NA 145 (H) 02/27/2023 1140   K 3.1 (L) 03/25/2023 1340   CL 109 03/25/2023 1340   CO2 29 03/25/2023 1340   BUN 19 03/25/2023 1340   BUN 22 02/27/2023 1140   CREATININE 0.76 03/25/2023 1340   CREATININE 0.85 01/29/2016 0910      Component Value Date/Time   CALCIUM 8.9 03/25/2023 1340   ALKPHOS 107 03/25/2023 1340   AST 17 03/25/2023 1340   ALT 27 03/25/2023 1340   BILITOT 0.4 03/25/2023 1340       RADIOGRAPHIC STUDIES: No results found.  ASSESSMENT AND PLAN: This is a very pleasant 82 years old white female with  1) history of stage I (T1a, N0, M0) thymoma type AB diagnosed in October 2023 status post resection under the care of Dr. Dorris Fetch on January 28, 2022 with close resection margin and microscopic infiltration of the capsule. 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  The patient is undergoing systemic therapy with daratumumab, Revlimid 15 mg p.o. daily for 21 days every 4 weeks in addition to Decadron 20 mg weekly with the treatment.  Status post 7 cycles.  She has been tolerating her treatment fairly well.    Multiple Myeloma (IgG subtype) Diagnosed in April 2024. Currently on daratumumab, Revlimid, and Decadron. Completed seven cycles, starting cycle eight. Blood work is stable, treatment well-tolerated. Potassium levels pending, may be low due to diarrhea. Discussed potential treatment break for significant side effects. Delayed last cycle due to Revlimid delivery issues. Advised early morning Imodium for diarrhea management. - Continue daratumumab, Revlimid, and Decadron - Administer Zometa today if scheduled - Consider extending Zometa to every three months - Monitor potassium levels and supplement as needed - Advise early morning Imodium for diarrhea - Resume Revlimid after a few days off - Stop Revlimid  one week before next infusion  Stage I Thymoma (post-surgical removal) Surgically removed in October 2023 by Dr. Dorris Fetch. No current issues. - Routine follow-up as needed  General Health Maintenance Discussed general well-being and holiday plans. - Encourage balanced diet and hydration - Advise regular follow-up appointments  Follow-up - Schedule next follow-up per treatment cycle.   The patient was advised to call immediately if she has any concerning symptoms in the interval. The patient voices understanding of current disease status and treatment options and is in agreement with the current care plan.  All questions were answered. The patient knows to call the clinic with any problems, questions or concerns. We can certainly see the patient much sooner if necessary.  The total time spent in the appointment was 30 minutes.  Disclaimer: This note was dictated with voice recognition software. Similar sounding words can inadvertently be transcribed and  may not be corrected upon review.

## 2023-04-01 NOTE — Progress Notes (Signed)
KRAUSKOPF, Chelsye [name] ([id number]) [id number] Page 1 of 8 Visit Report for 03/31/2023 Arrival Information Details Patient Name: Date of Service: [name], [name] [name] 03/31/2023 12:15 PM Medical Record Number: [id number] Patient Account Number: [id number] Date of Birth/Sex: Treating RN: [birth year] (82 y.o. [name] Primary Care Ellieana Dolecki: Hillard Danker Other Clinician: Referring Lititia Sen: Treating Marvene Strohm/Extender: Madelynn Done Weeks in Treatment: 23 Visit Information History Since Last Visit Added or deleted any medications: No Patient Arrived: Ambulatory Any new allergies or adverse reactions: No Arrival Time: 12:26 Had a fall or experienced change in No Accompanied By: daughter in law activities of daily living that may affect Transfer Assistance: None risk of falls: Patient Identification Verified: Yes Hospitalized since last visit: No Secondary Verification Process Completed: Yes Has Dressing in Place as Prescribed: Yes Patient Requires Transmission-Based Precautions: No Pain Present Now: No Patient Has Alerts: Yes Patient Alerts: Not Diabetic Electronic Signature(s) Signed: 03/31/2023 5:12:14 PM By: Midge Aver MSN RN CNS WTA Entered By: Midge Aver on 03/31/2023 12:27:55 -------------------------------------------------------------------------------- Clinic Level of Care Assessment Details Patient Name: Date of Service: [name], [name] [name] 03/31/2023 12:15 PM Medical Record Number: [id number] Patient Account Number: [id number] Date of Birth/Sex: Treating RN: 10-18-1940 (82 y.o. [name] Primary Care Jenese Mischke: Hillard Danker Other Clinician: Referring Tressie Ragin: Treating Tillmon Kisling/Extender: Madelynn Done Weeks in Treatment: 23 Clinic Level of Care Assessment Items TOOL 4 Quantity Score X- 1 0 Use when only an EandM is performed on FOLLOW-UP visit ASSESSMENTS - Nursing Assessment / Reassessment X- 1 10 Reassessment of  Co-morbidities (includes updates in patient status) X- 1 5 Reassessment of Adherence to Treatment Plan ASSESSMENTS - Wound and Skin A ssessment / Reassessment X - Simple Wound Assessment / Reassessment - one wound 1 5 Cerros, Tiawana [name] ([id number]) [id number] Page 2 of 8 []  - 0 Complex Wound Assessment / Reassessment - multiple wounds []  - 0 Dermatologic / Skin Assessment (not related to wound area) ASSESSMENTS - Focused Assessment []  - 0 Circumferential Edema Measurements - multi extremities []  - 0 Nutritional Assessment / Counseling / Intervention []  - 0 Lower Extremity Assessment (monofilament, tuning fork, pulses) []  - 0 Peripheral Arterial Disease Assessment (using hand held doppler) ASSESSMENTS - Ostomy and/or Continence Assessment and Care []  - 0 Incontinence Assessment and Management []  - 0 Ostomy Care Assessment and Management (repouching, etc.) PROCESS - Coordination of Care X - Simple Patient / Family Education for ongoing care 1 15 []  - 0 Complex (extensive) Patient / Family Education for ongoing care X- 1 10 Staff obtains Chiropractor, Records, T Results / Process Orders est []  - 0 Staff telephones HHA, Nursing Homes / Clarify orders / etc []  - 0 Routine Transfer to another Facility (non-emergent condition) []  - 0 Routine Hospital Admission (non-emergent condition) []  - 0 New Admissions / Manufacturing engineer / Ordering NPWT Apligraf, etc. , []  - 0 Emergency Hospital Admission (emergent condition) X- 1 10 Simple Discharge Coordination []  - 0 Complex (extensive) Discharge Coordination PROCESS - Special Needs []  - 0 Pediatric / Minor Patient Management []  - 0 Isolation Patient Management []  - 0 Hearing / Language / Visual special needs []  - 0 Assessment of Community assistance (transportation, D/C planning, etc.) []  - 0 Additional assistance / Altered mentation []  - 0 Support Surface(s) Assessment (bed, cushion, seat,  etc.) INTERVENTIONS - Wound Cleansing / Measurement X - Simple Wound Cleansing - one wound 1 5 []  - 0 Complex Wound Cleansing - multiple wounds X- 1 5 Wound Imaging (photographs -  any number of wounds) []  - 0 Wound Tracing (instead of photographs) X- 1 5 Simple Wound Measurement - one wound []  - 0 Complex Wound Measurement - multiple wounds INTERVENTIONS - Wound Dressings []  - 0 Small Wound Dressing one or multiple wounds []  - 0 Medium Wound Dressing one or multiple wounds []  - 0 Large Wound Dressing one or multiple wounds X- 1 5 Application of Medications - topical []  - 0 Application of Medications - injection INTERVENTIONS - Miscellaneous []  - 0 External ear exam []  - 0 Specimen Collection (cultures, biopsies, blood, body fluids, etc.) []  - 0 Specimen(s) / Culture(s) sent or taken to Lab for analysis [name], [name] ([id number]) [id number] Page 3 of 8 []  - 0 Patient Transfer (multiple staff / Nurse, adult / Similar devices) []  - 0 Simple Staple / Suture removal (25 or less) []  - 0 Complex Staple / Suture removal (26 or more) []  - 0 Hypo / Hyperglycemic Management (close monitor of Blood Glucose) []  - 0 Ankle / Brachial Index (ABI) - do not check if billed separately X- 1 5 Vital Signs Has the patient been seen at the hospital within the last three years: Yes Total Score: 80 Level Of Care: New/Established - Level 3 Electronic Signature(s) Signed: 03/31/2023 5:12:14 PM By: Midge Aver MSN RN CNS WTA Entered By: Midge Aver on 03/31/2023 13:09:52 -------------------------------------------------------------------------------- Encounter Discharge Information Details Patient Name: Date of Service: [name] 03/31/2023 12:15 PM Medical Record Number: [id number] Patient Account Number: [id number] Date of Birth/Sex: Treating RN: 1940/09/25 (82 y.o. [name] Primary Care Juwan Vences: Hillard Danker Other Clinician: Referring  Angeletta Goelz: Treating Andreus Cure/Extender: Madelynn Done Weeks in Treatment: 23 Encounter Discharge Information Items Discharge Condition: Stable Ambulatory Status: Ambulatory Discharge Destination: Home Transportation: Private Auto Accompanied By: daughter in law Schedule Follow-up Appointment: No Clinical Summary of Care: Electronic Signature(s) Signed: 03/31/2023 5:12:14 PM By: Midge Aver MSN RN CNS WTA Entered By: Midge Aver on 03/31/2023 13:11:27 -------------------------------------------------------------------------------- Lower Extremity Assessment Details Patient Name: Date of Service: [name], [name] [name] 03/31/2023 12:15 PM Medical Record Number: [id number] Patient Account Number: [id number] Date of Birth/Sex: Treating RN: [birth year] (82 y.o. [name] Primary Care Taylen Wendland: Hillard Danker Other Clinician: Pricilla Holm, Kasyn [name] ([id number]) [id number] Page 4 of 8 Referring Lular Letson: Treating Elliana Bal/Extender: Madelynn Done Weeks in Treatment: 23 Electronic Signature(s) Signed: 03/31/2023 5:12:14 PM By: Midge Aver MSN RN CNS WTA Entered By: Midge Aver on 03/31/2023 12:35:11 -------------------------------------------------------------------------------- Multi Wound Chart Details Patient Name: Date of Service: [name] 03/31/2023 12:15 PM Medical Record Number: [id number] Patient Account Number: [id number] Date of Birth/Sex: Treating RN: 01-02-1941 (82 y.o. [name] Primary Care Jasneet Schobert: Hillard Danker Other Clinician: Referring Tarri Guilfoil: Treating Meher Kucinski/Extender: Madelynn Done Weeks in Treatment: 23 Vital Signs Height(in): Pulse(bpm): 60 Weight(lbs): Blood Pressure(mmHg): 160/49 Body Mass Index(BMI): Temperature(F): 98.3 Respiratory Rate(breaths/min): 18 [1:Photos:] [N/A:N/A] Midline Coccyx N/A N/A Wound Location: Pressure Injury N/A N/A Wounding Event: Pressure Ulcer N/A N/A Primary  Etiology: Hypertension, Received N/A N/A Comorbid History: Chemotherapy 08/30/2022 N/A N/A Date Acquired: 80 N/A N/A Weeks of Treatment: Open N/A N/A Wound Status: No N/A N/A Wound Recurrence: 0.5x0.2x0.1 N/A N/A Measurements L x W x D (cm) 0.079 N/A N/A A (cm) : rea 0.008 N/A N/A Volume (cm) : 99.80% N/A N/A % Reduction in A rea: 100.00% N/A N/A % 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 N/A N/A Classification: Medium N/A N/A Exudate A mount: Serosanguineous N/A N/A Exudate Type: red, brown N/A  N/A Exudate Color: Medium (34-66%) N/A N/A Granulation A mount: Red, Pink N/A N/A Granulation Quality: Medium (34-66%) N/A N/A Necrotic A mount: Fat Layer (Subcutaneous Tissue): Yes N/A N/A Exposed Structures: Bone: Yes Fascia: No Tendon: No Muscle: No Joint: No None N/A N/A Epithelialization: Zieger, Evette [name] ([id number]) [id number] Page 5 of 8 Treatment Notes Electronic Signature(s) Signed: 03/31/2023 5:12:14 PM By: Midge Aver MSN RN CNS WTA Entered By: Midge Aver on 03/31/2023 13:02:32 -------------------------------------------------------------------------------- Multi-Disciplinary Care Plan Details Patient Name: Date of Service: [name], [name] [name] 03/31/2023 12:15 PM Medical Record Number: [id number] Patient Account Number: [id number] Date of Birth/Sex: Treating RN: December 30, 1940 (82 y.o. [name] Primary Care Kitiara Hintze: Hillard Danker Other Clinician: Referring Natoshia Souter: Treating Dashton Czerwinski/Extender: Madelynn Done Weeks in Treatment: 23 Active Inactive Electronic Signature(s) Signed: 03/31/2023 5:12:14 PM By: Midge Aver MSN RN CNS WTA Entered By: Midge Aver on 03/31/2023 13:10:04 -------------------------------------------------------------------------------- Pain Assessment Details Patient Name: Date of Service: [name], [name] [name] 03/31/2023 12:15 PM Medical Record Number: [id number] Patient Account Number:  [id number] Date of Birth/Sex: Treating RN: Apr 17, 1940 (82 y.o. [name] Primary Care Lateefa Crosby: Hillard Danker Other Clinician: Referring Tahira Olivarez: Treating Rubby Barbary/Extender: Madelynn Done Weeks in Treatment: 23 Active Problems Location of Pain Severity and Description of Pain Patient Has Paino No Site Locations [place] [address], [place] [name] ([id number]) [phone].pdf Page 6 of 8 Pain Management and Medication Current Pain Management: Electronic Signature(s) Signed: 03/31/2023 5:12:14 PM By: Midge Aver MSN RN CNS WTA Entered By: Midge Aver on 03/31/2023 12:33:50 -------------------------------------------------------------------------------- Patient/Caregiver Education Details Patient Name: Date of Service: [name] 12/23/2024andnbsp12:15 PM Medical Record Number: [id number] Patient Account Number: [id number] Date of Birth/Gender: Treating RN: 07-22-1940 (82 y.o. [name] Primary Care Physician: Hillard Danker Other Clinician: Referring Physician: Treating Physician/Extender: Madelynn Done Weeks in Treatment: 23 Education Assessment Education Provided To: Patient Education Topics Provided Discharge Packet: Handouts: Other: Turn and keep pressure off the area Methods: Explain/Verbal Responses: State content correctly Electronic Signature(s) Signed: 03/31/2023 5:12:14 PM By: Midge Aver MSN RN CNS WTA Entered By: Midge Aver on 03/31/2023 13:10:39 [name], [name] ([id number]) [id number] Page 7 of 8 -------------------------------------------------------------------------------- Wound Assessment Details Patient Name: Date of Service: [name], [name] [name] 03/31/2023 12:15 PM Medical Record Number: [id number] Patient Account Number: [id number] Date of Birth/Sex: Treating RN: 09-Jan-1941 (82 y.o. [name] Primary Care Damarian Priola: Hillard Danker Other Clinician: Referring Jenea Dake: Treating Prisila Dlouhy/Extender:  Madelynn Done Weeks in Treatment: 23 Wound Status Wound Number: 1 Primary Etiology: Pressure Ulcer Wound Location: Midline Coccyx Wound Status: Open Wounding Event: Pressure Injury Comorbid History: Hypertension, Received Chemotherapy Date Acquired: 08/30/2022 Weeks Of Treatment: 23 Clustered Wound: No Photos Wound Measurements Length: (cm) 0.5 Width: (cm) 0.2 Depth: (cm) 0.1 Area: (cm) 0.079 Volume: (cm) 0.008 % Reduction in Area: 99.8% % Reduction in Volume: 100% Epithelialization: None Wound Description Classification: Category/Stage IV Exudate Amount: Medium Exudate Type: Serosanguineous Exudate Color: red, brown Foul Odor After Cleansing: No Slough/Fibrino Yes Wound Bed Granulation Amount: Medium (34-66%) Exposed Structure Granulation Quality: Red, Pink Fascia Exposed: No Necrotic Amount: Medium (34-66%) Fat Layer (Subcutaneous Tissue) Exposed: Yes Tendon Exposed: No Muscle Exposed: No Joint Exposed: No Bone Exposed: Yes Electronic Signature(s) Signed: 03/31/2023 5:12:14 PM By: Midge Aver MSN RN CNS WTA Entered By: Midge Aver on 03/31/2023 12:34:45 [name], [name] ([id number]) [id number] Page 8 of 8 -------------------------------------------------------------------------------- Vitals Details Patient Name: Date of Service: [name], [name] [name] 03/31/2023 12:15 PM Medical Record Number: [id number] Patient Account Number: [id number]  Date of Birth/Sex: Treating RN: Nov 12, 1940 (82 y.o. [name] Primary Care Destyne Goodreau: Hillard Danker Other Clinician: Referring Jondavid Schreier: Treating Michal Strzelecki/Extender: Madelynn Done Weeks in Treatment: 23 Vital Signs Time Taken: 12:27 Temperature (F): 98.3 Pulse (bpm): 60 Respiratory Rate (breaths/min): 18 Blood Pressure (mmHg): 160/49 Reference Range: 80 - 120 mg / dl Electronic Signature(s) Signed: 03/31/2023 5:12:14 PM By: Midge Aver MSN RN CNS WTA Entered By: Midge Aver on  03/31/2023 12:28:13

## 2023-04-01 NOTE — Progress Notes (Signed)
[name], [name] ([id number]) [id number] Page 1 of 7 Visit Report for 03/31/2023 Chief Complaint Document Details Patient Name: Date of Service: [name] 03/31/2023 12:15 PM Medical Record Number: [id number] Patient Account Number: [id number] Date of Birth/Sex: Treating RN: 09-Dec-1940 (82 y.o. [name] Primary Care Provider: Hillard Danker Other Clinician: Referring Provider: Treating Provider/Extender: Madelynn Done Weeks in Treatment: 23 Information Obtained from: Patient Chief Complaint Stage 4 pressure ulcer Electronic Signature(s) Signed: 03/31/2023 1:31:15 PM By: Allen Derry PA-C Entered By: Allen Derry on 03/31/2023 13:31:14 -------------------------------------------------------------------------------- HPI Details Patient Name: Date of Service: [name], [name] [name] 03/31/2023 12:15 PM Medical Record Number: [id number] Patient Account Number: [id number] Date of Birth/Sex: Treating RN: 09-19-1940 (82 y.o. 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 Currently there is not direct bone exposure because she is now covered with granulation tissue her family has been taking care of her at home at this point although she was previously in a facility. This is something that was noted when she ended up having to go to the ER initially on Aug 30, 2022. Subsequently during hospital stent it was started that she had gentamicin applied topically along with Dakin's moistened gauze packing which has been done at home now and seems to be doing very well. She was at [facility] skilled  nursing facility for time. She did have Levaquin previous which she did do very well for her as far as get the infection under control in my opinion I think she is actually seeming to do quite well at this point. She is not having any pain which is also excellent news at this time. She is starting to get around more so she is not completely bedbound although she is a little weak she does have family that is very attentive and taking great care of her. 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 There is an area of bone that is very thinly covered. Been using Dakin's wet-to-dry dressings. She has in-home caregivers 24/7 and also home health coming out to change dressings/participate in dressing supervision although most of her dressing changes are being done by her own in-home caregivers. 11-04-2022 upon evaluation today patient appears to be doing well currently in regard to her wound. With that being said I do feel like she is making really good progress towards healing currently. I do not see any evidence of active infection locally nor systemically which is excellent news as well. No fevers, chills, Ghrist, [name] ([id number]) [id number] Page 2 of 7 nausea, vomiting, or diarrhea. 11-12-2022 patient's wound today appears to be doing okay although to be honest I am a little concerned about the fact that the wound VAC was not put on properly. It was not bridged she was laying on the tubing and this course was not very comfortable for her. Fortunately I do not see any signs of active infection locally or systemically which is great news. No fevers, chills, nausea, vomiting, or diarrhea. 11-18-2022 upon evaluation today patient appears to be doing well  currently in regard to her wound. This is actually  showing signs of improvement wound VAC was put on much more eloquently this time compared to last I am actually very pleased. He does not like there may be some irritation around the edges of the wound I think this may be subsequent to an issue going on with infection. We can look into trying to determine what this may be and then adjusting medications as needed to take care of that infection if so. 11-25-2022 upon evaluation today patient appears to be doing well currently in regard to her wound which is actually showing signs of improvement feeling little by little. The irritation around the edges of the wound is better and the wound VAC seems to have been doing well until he ran out of the DuoDERM and apparently last time this was put on the DuoDERM was not utilized. Again I think that is something that has been extremely beneficial for keeping this wound VAC in place without any complications in the past. 8/26; patient comes in with home health changing the wound VAC 3 times a week Monday Wednesday and Friday. Per our intake nurse the tunneling area superiorly has close down nicely. Progress has been good. She did not come in with a wound VAC on, they apparently took it off so she could be a but home health is coming in later today to change it. There is some concerns about next Monday being a holiday with regards to wound VAC change by home health 12-16-2022 upon evaluation today patient appears to be doing well currently in regard to her wound. She has been tolerating the dressing changes without complication. Fortunately there does not appear to be any signs of active infection locally or systemically which is great news. No fevers, chills, nausea, vomiting, or diarrhea. 12-23-2022 upon evaluation today patient appears to be doing well currently in regard to her sacral wound. This is actually showing signs of significant improvement. Fortunately I do not see any evidence of worsening this has  filled in quite nicely and really there is no significant depth to the wound is pretty much flush. I do believe that she may benefit from discontinuation of the VAC on the right least. On hold this week and see where things stand she is in agreement with that plan. 12-30-2022 upon evaluation today patient appears to be doing well currently in regard to her wound which is actually showing signs of excellent improvement. Fortunately I do not see any evidence of worsening overall and I do believe that the patient is making great headway towards complete closure. 01-06-2023 upon evaluation today patient appears to be doing well currently in regard to her wound on the sacral area this is actually showing signs of excellent improvement and actually very pleased with where we stand. Fortunately I do not see any signs of worsening in general. 01-13-2023 upon evaluation today patient appears to be doing excellent in regards to her wound which is actually showing signs of great improvement. The hypergranulation is greatly improved compared to last week after the silver nitrate that there is a little bit of necrotic tissue to remove that something I think I can do today without any significant complication. She is in agreement with that plan. 01-20-2023 upon evaluation today patient actually appears to be making good headway towards closure. Fortunately I do not see any signs of active infection at this time which is great news. No fevers, chills, nausea, vomiting, or diarrhea. 02-03-2023 upon  evaluation today patient's wound actually appears to be doing about the same as last time I saw her. Upon discussion with the family it was apparent that the patient may not be keeping the dressing on all the time. On the days when the aide is there she does make sure that is in place but on other days it is uncertain whether or not this is actually being kept in place and it sounds like the answer may be no. Nonetheless I  think that in general she seems to be making some headway here towards closure. 02-17-2023 upon evaluation today patient's wound does not appear to be bigger in size overall but unfortunately does have some bruising around the edges of the wound which is unfortunate. Fortunately I do not see any signs of active infection locally or systemically which is great news. No fevers, chills, nausea, vomiting, or diarrhea. 02-25-2023 upon evaluation today patient appears to be doing well currently in regard to her wound this is much better than last week last week we had a lot of bruising that is definitely not we wanted to see. This week does appear to be better which is great news and in general I think that we are moving in the right direction here. There does not appear to be any signs of active infection at this time. 03-04-2023 upon evaluation today patient appears to be doing well currently in regard to her wound which is actually showing signs of improvement compared to last time I feel like she is calm actually quite a ways. Fortunately I do not see any signs of active infection locally or systemically at this time which is great news. No fevers, chills, nausea, vomiting, or diarrhea. 03-17-2023 upon evaluation today patient appears to be doing well currently in regard to her wound which is showing signs of improvement and actually very pleased with where we stand. With that being said this is gotten so small and it has gotten so well as far as healing is concerned that is draining very minimally. I think the Hydrofera Blue may not be doing quite as much for her at this point I think switching over to a collagen based dressing may be the best way to go. 03-31-2023 upon evaluation today patient appears to be doing well currently in regard to her wound. She actually is showing signs of excellent improvement in fact this appears to be completely healed based on what I am seeing I do not see any signs of  infection I do not see any signs of worsening overall. I am very pleased with where things stand today. Electronic Signature(s) Signed: 03/31/2023 2:24:10 PM By: Allen Derry PA-C Entered By: Allen Derry on 03/31/2023 14:24:10 Physical Exam Details -------------------------------------------------------------------------------- [name] ([id number]) [id number] Page 3 of 7 Patient Name: Date of Service: [name], [name] 03/31/2023 12:15 PM Medical Record Number: [id number] Patient Account Number: [id number] Date of Birth/Sex: Treating RN: 04-07-1941 (82 y.o. [name] Primary Care Provider: Hillard Danker Other Clinician: Referring Provider: Treating Provider/Extender: Madelynn Done Weeks in Treatment: 54 Constitutional Well-nourished and well-hydrated in no acute distress. Respiratory normal breathing without difficulty. Psychiatric this patient is able to make decisions and demonstrates good insight into disease process. Alert and Oriented x 3. pleasant and cooperative. Notes Upon evaluation patient appears to be doing excellent infection appears to be completely healed I am very pleased in that regard I do not see any signs of active infection at this time. Electronic Signature(s)  Signed: 03/31/2023 4:55:34 PM By: Allen Derry PA-C Entered By: Allen Derry on 03/31/2023 16:55:34 -------------------------------------------------------------------------------- Physician Orders Details Patient Name: Date of Service: [name], [name] [name] 03/31/2023 12:15 PM Medical Record Number: [id number] Patient Account Number: [id number] Date of Birth/Sex: Treating RN: 06-Apr-1941 (82 y.o. [name] Primary Care Provider: Hillard Danker Other Clinician: Referring Provider: Treating Provider/Extender: Madelynn Done Weeks in Treatment: 23 The following information was scribed by: Midge Aver The information was scribed for: Allen Derry Verbal /  Phone Orders: No Diagnosis Coding Discharge From [facility] Services Discharge from [facility] Treatment Complete - Apply A and D ointment to the area twice a day Wound Treatment Electronic Signature(s) Signed: 03/31/2023 5:11:14 PM By: Allen Derry PA-C Signed: 03/31/2023 5:12:14 PM By: Midge Aver MSN RN CNS WTA Entered By: Midge Aver on 03/31/2023 13:04:49 Crispen, Kalista [name] ([id number]) [id number] Page 4 of 7 -------------------------------------------------------------------------------- Problem List Details Patient Name: Date of Service: [name], [name] 03/31/2023 12:15 PM Medical Record Number: [id number] Patient Account Number: [id number] Date of Birth/Sex: Treating RN: 07-Jan-1941 (82 y.o. [name] Primary Care Provider: Hillard Danker Other Clinician: Referring Provider: Treating Provider/Extender: Madelynn Done Weeks in Treatment: 23 Active Problems ICD-10 Encounter Code Description Active Date MDM Diagnosis L89.154 Pressure ulcer of sacral region, stage 4 10/21/2022 No Yes M62.81 Muscle weakness (generalized) 10/21/2022 No Yes C90.00 Multiple myeloma not having achieved remission 10/21/2022 No Yes Inactive Problems Resolved Problems Electronic Signature(s) Signed: 03/31/2023 1:31:03 PM By: Allen Derry PA-C Entered By: Allen Derry on 03/31/2023 13:31:03 -------------------------------------------------------------------------------- Progress Note Details Patient Name: Date of Service: [name], [name] [name] 03/31/2023 12:15 PM Medical Record Number: [id number] Patient Account Number: [id number] Date of Birth/Sex: Treating RN: [birth year] (82 y.o. 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 Currently there is not direct bone exposure because she is now covered with granulation tissue her family has been taking care of her at home at this point although she was previously in a facility. This is something that was noted when she ended up having to go to the ER initially on Aug 30, 2022. Subsequently during hospital stent it was started that she had gentamicin applied topically along with Dakin's moistened gauze packing which has been done at home now and seems to be doing very well. She was at [facility] skilled nursing facility for time. She did have Levaquin previous which she did do very well for her as far as get the infection under control in my opinion I think she is actually seeming to do quite well at this point. She is not having any pain which is also excellent news at this time. She is starting to get around more so she is not completely bedbound although she is a little weak she does have family that is very attentive and taking great care of her. 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 There is an area  of bone that is very thinly covered. Been using Dakin's wet-to-dry dressings. She has in-home caregivers 24/7 and also home health coming out to change dressings/participate in dressing supervision although most of her dressing changes are being done by her own in-home caregivers. 11-04-2022 upon evaluation today patient appears to be doing well currently in regard to her wound. With that being said I do feel like she is making  really good progress towards healing currently. I do not see any evidence of active infection locally nor systemically which is excellent news as well. No fevers, chills, nausea, vomiting, or diarrhea. 11-12-2022 patient's wound today appears to be doing okay although to be honest I am a little concerned about the fact that the wound VAC was not put on properly. It was not bridged she was laying on the tubing and this course was not very comfortable for her. Fortunately I do not see any signs of active infection locally or systemically which is great news. No fevers, chills, nausea, vomiting, or diarrhea. 11-18-2022 upon evaluation today patient appears to be doing well currently in regard to her wound. This is actually showing signs of improvement wound VAC was put on much more eloquently this time compared to last I am actually very pleased. He does not like there may be some irritation around the edges of the wound I think this may be subsequent to an issue going on with infection. We can look into trying to determine what this may be and then adjusting medications as needed to take care of that infection if so. 11-25-2022 upon evaluation today patient appears to be doing well currently in regard to her wound which is actually showing signs of improvement feeling little by little. The irritation around the edges of the wound is better and the wound VAC seems to have been doing well until he ran out of the DuoDERM and apparently last time this was put on the DuoDERM was not utilized. Again I think that is something that has been extremely beneficial for keeping this wound VAC in place without any complications in the past. 8/26; patient comes in with home health changing the wound VAC 3 times a week Monday Wednesday and Friday. Per our intake nurse the tunneling area superiorly has close down nicely. Progress has been good. She did not come in with a wound VAC on, they apparently took it off so she  could be a but home health is coming in later today to change it. There is some concerns about next Monday being a holiday with regards to wound VAC change by home health 12-16-2022 upon evaluation today patient appears to be doing well currently in regard to her wound. She has been tolerating the dressing changes without complication. Fortunately there does not appear to be any signs of active infection locally or systemically which is great news. No fevers, chills, nausea, vomiting, or diarrhea. 12-23-2022 upon evaluation today patient appears to be doing well currently in regard to her sacral wound. This is actually showing signs of significant improvement. Fortunately I do not see any evidence of worsening this has filled in quite nicely and really there is no significant depth to the wound is pretty much flush. I do believe that she may benefit from discontinuation of the VAC on the right least. On hold this week and see where things stand she is in agreement with that plan. 12-30-2022 upon evaluation today patient appears to be doing well currently in regard to her wound which  is actually showing signs of excellent improvement. Fortunately I do not see any evidence of worsening overall and I do believe that the patient is making great headway towards complete closure. 01-06-2023 upon evaluation today patient appears to be doing well currently in regard to her wound on the sacral area this is actually showing signs of excellent improvement and actually very pleased with where we stand. Fortunately I do not see any signs of worsening in general. 01-13-2023 upon evaluation today patient appears to be doing excellent in regards to her wound which is actually showing signs of great improvement. The hypergranulation is greatly improved compared to last week after the silver nitrate that there is a little bit of necrotic tissue to remove that something I think I can do today without any significant  complication. She is in agreement with that plan. 01-20-2023 upon evaluation today patient actually appears to be making good headway towards closure. Fortunately I do not see any signs of active infection at this time which is great news. No fevers, chills, nausea, vomiting, or diarrhea. 02-03-2023 upon evaluation today patient's wound actually appears to be doing about the same as last time I saw her. Upon discussion with the family it was apparent that the patient may not be keeping the dressing on all the time. On the days when the aide is there she does make sure that is in place but on other days it is uncertain whether or not this is actually being kept in place and it sounds like the answer may be no. Nonetheless I think that in general she seems to be making some headway here towards closure. 02-17-2023 upon evaluation today patient's wound does not appear to be bigger in size overall but unfortunately does have some bruising around the edges of the wound which is unfortunate. Fortunately I do not see any signs of active infection locally or systemically which is great news. No fevers, chills, nausea, vomiting, or diarrhea. 02-25-2023 upon evaluation today patient appears to be doing well currently in regard to her wound this is much better than last week last week we had a lot of bruising that is definitely not we wanted to see. This week does appear to be better which is great news and in general I think that we are moving in the right direction here. There does not appear to be any signs of active infection at this time. 03-04-2023 upon evaluation today patient appears to be doing well currently in regard to her wound which is actually showing signs of improvement compared to last time I feel like she is calm actually quite a ways. Fortunately I do not see any signs of active infection locally or systemically at this time which is great news. No fevers, chills, nausea, vomiting, or  diarrhea. 03-17-2023 upon evaluation today patient appears to be doing well currently in regard to her wound which is showing signs of improvement and actually very pleased with where we stand. With that being said this is gotten so small and it has gotten so well as far as healing is concerned that is draining very minimally. I think the Hydrofera Blue may not be doing quite as much for her at this point I think switching over to a collagen based dressing may be the best way to go. 03-31-2023 upon evaluation today patient appears to be doing well currently in regard to her wound. She actually is showing signs of excellent improvement in fact this appears to be completely healed  based on what I am seeing I do not see any signs of infection I do not see any signs of worsening overall. I am very pleased with where things stand today. [name], [name] ([id number]) [id number] Page 6 of 7 Objective Constitutional Well-nourished and well-hydrated in no acute distress. Vitals Time Taken: 12:27 PM, Temperature: 98.3 F, Pulse: 60 bpm, Respiratory Rate: 18 breaths/min, Blood Pressure: 160/49 mmHg. Respiratory normal breathing without difficulty. Psychiatric this patient is able to make decisions and demonstrates good insight into disease process. Alert and Oriented x 3. pleasant and cooperative. General Notes: Upon evaluation patient appears to be doing excellent infection appears to be completely healed I am very pleased in that regard I do not see any signs of active infection at this time. Integumentary (Hair, Skin) Wound #1 status is Open. Original cause of wound was Pressure Injury. The date acquired was: 08/30/2022. The wound has been in treatment 23 weeks. The wound is located on the Midline Coccyx. The wound measures 0.5cm length x 0.2cm width x 0.1cm depth; 0.079cm^2 area and 0.008cm^3 volume. There is bone and Fat Layer (Subcutaneous Tissue) exposed. There is a medium  amount of serosanguineous drainage noted. There is medium (34-66%) red, pink granulation within the wound bed. There is a medium (34-66%) amount of necrotic tissue within the wound bed. Assessment Active Problems ICD-10 Pressure ulcer of sacral region, stage 4 Muscle weakness (generalized) Multiple myeloma not having achieved remission Plan Discharge From [facility] Services: Discharge from Wound Care Center Treatment Complete - Apply A and D ointment to the area twice a day 1. I would recommend based on what we are seeing that we have the patient going continue to monitor for any signs of infection or worsening. Overall I believe that she is doing well I think she is ready for discharge. 2. I would recommend that she use just topical AandD ointment to help keep the area moist and protected in the sacral area she is in agreement with this plan her family member was present for the evaluation and discussion and agrees as well. We will see patient back for reevaluation in 1 week here in the clinic. If anything worsens or changes patient will contact our office for additional recommendations. Electronic Signature(s) Signed: 03/31/2023 4:56:02 PM By: Allen Derry PA-C Entered By: Allen Derry on 03/31/2023 16:56:02 -------------------------------------------------------------------------------- SuperBill Details Patient Name: Date of Service: [name] 03/31/2023 Medical Record Number: [id number] Patient Account Number: [id number] [name], [name] ([id number]) [id number] Page 7 of 7 Date of Birth/Sex: Treating RN: 1940/10/19 (82 y.o. 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

## 2023-04-01 NOTE — Patient Instructions (Signed)
[facility] - A DEPT OF [facility]  Discharge Instructions: Thank you for choosing [facility] to provide your oncology and hematology care.   If you have a lab appointment with the Cancer Center, please go directly to the Cancer Center and check in at the registration area.   Wear comfortable clothing and clothing appropriate for easy access to any Portacath or PICC line.   We strive to give you quality time with your provider. You may need to reschedule your appointment if you arrive late (15 or more minutes).  Arriving late affects you and other patients whose appointments are after yours.  Also, if you miss three or more appointments without notifying the office, you may be dismissed from the clinic at the provider's discretion.      For prescription refill requests, have your pharmacy contact our office and allow 72 hours for refills to be completed.    Today you received the following chemotherapy and/or immunotherapy agents: Darzalex Faspro      To help prevent nausea and vomiting after your treatment, we encourage you to take your nausea medication as directed.  BELOW ARE SYMPTOMS THAT SHOULD BE REPORTED IMMEDIATELY: *FEVER GREATER THAN 100.4 F (38 C) OR HIGHER *CHILLS OR SWEATING *NAUSEA AND VOMITING THAT IS NOT CONTROLLED WITH YOUR NAUSEA MEDICATION *UNUSUAL SHORTNESS OF BREATH *UNUSUAL BRUISING OR BLEEDING *URINARY PROBLEMS (pain or burning when urinating, or frequent urination) *BOWEL PROBLEMS (unusual diarrhea, constipation, pain near the anus) TENDERNESS IN MOUTH AND THROAT WITH OR WITHOUT PRESENCE OF ULCERS (sore throat, sores in mouth, or a toothache) UNUSUAL RASH, SWELLING OR PAIN  UNUSUAL VAGINAL DISCHARGE OR ITCHING   Items with * indicate a potential emergency and should be followed up as soon as possible or go to the Emergency Department if any problems should occur.  Please show the CHEMOTHERAPY ALERT CARD or  IMMUNOTHERAPY ALERT CARD at check-in to the Emergency Department and triage nurse.  Should you have questions after your visit or need to cancel or reschedule your appointment, please contact [facility] - A DEPT OF [facility]  Dept: [phone]  and follow the prompts.  Office hours are 8:00 a.m. to 4:30 p.m. Monday - Friday. Please note that voicemails left after 4:00 p.m. may not be returned until the following business day.  We are closed weekends and major holidays. You have access to a nurse at all times for urgent questions. Please call the main number to the clinic Dept: [phone] and follow the prompts.   For any non-urgent questions, you may also contact your provider using MyChart. We now offer e-Visits for anyone 31 and older to request care online for non-urgent symptoms. For details visit [email or web address].   Also download the MyChart app! Go to the app store, search "MyChart", open the app, select [facility], and log in with your MyChart username and password.  Zoledronic Acid Injection (Cancer) What is this medication? ZOLEDRONIC ACID (ZOE le dron ik AS id) treats high calcium levels in the blood caused by cancer. It may also be used with chemotherapy to treat weakened bones caused by cancer. It works by slowing down the release of calcium from bones. This lowers calcium levels in your blood. It also makes your bones stronger and less likely to break (fracture). It belongs to a group of medications called bisphosphonates. This medicine may be used for other purposes; ask your health care  provider or pharmacist if you have questions. COMMON BRAND NAME(S): Zometa, Zometa Powder What should I tell my care team before I take this medication? They need to know if you have any of these conditions: Dehydration Dental disease Kidney disease Liver disease Low levels of calcium in the blood Lung or breathing disease, such as asthma Receiving  steroids, such as dexamethasone or prednisone An unusual or allergic reaction to zoledronic acid, other medications, foods, dyes, or preservatives Pregnant or trying to get pregnant Breast-feeding How should I use this medication? This medication is injected into a vein. It is given by your care team in a hospital or clinic setting. Talk to your care team about the use of this medication in children. Special care may be needed. Overdosage: If you think you have taken too much of this medicine contact a poison control center or emergency room at once. NOTE: This medicine is only for you. Do not share this medicine with others. What if I miss a dose? Keep appointments for follow-up doses. It is important not to miss your dose. Call your care team if you are unable to keep an appointment. What may interact with this medication? Certain antibiotics given by injection Diuretics, such as bumetanide, furosemide NSAIDs, medications for pain and inflammation, such as ibuprofen or naproxen Teriparatide Thalidomide This list may not describe all possible interactions. Give your health care provider a list of all the medicines, herbs, non-prescription drugs, or dietary supplements you use. Also tell them if you smoke, drink alcohol, or use illegal drugs. Some items may interact with your medicine. What should I watch for while using this medication? Visit your care team for regular checks on your progress. It may be some time before you see the benefit from this medication. Some people who take this medication have severe bone, joint, or muscle pain. This medication may also increase your risk for jaw problems or a broken thigh bone. Tell your care team right away if you have severe pain in your jaw, bones, joints, or muscles. Tell you care team if you have any pain that does not go away or that gets worse. Tell your dentist and dental surgeon that you are taking this medication. You should not have major  dental surgery while on this medication. See your dentist to have a dental exam and fix any dental problems before starting this medication. Take good care of your teeth while on this medication. Make sure you see your dentist for regular follow-up appointments. You should make sure you get enough calcium and vitamin D while you are taking this medication. Discuss the foods you eat and the vitamins you take with your care team. Check with your care team if you have severe diarrhea, nausea, and vomiting, or if you sweat a lot. The loss of too much body fluid may make it dangerous for you to take this medication. You may need bloodwork while taking this medication. Talk to your care team if you wish to become pregnant or think you might be pregnant. This medication can cause serious birth defects. What side effects may I notice from receiving this medication? Side effects that you should report to your care team as soon as possible: Allergic reactions--skin rash, itching, hives, swelling of the face, lips, tongue, or throat Kidney injury--decrease in the amount of urine, swelling of the ankles, hands, or feet Low calcium level--muscle pain or cramps, confusion, tingling, or numbness in the hands or feet Osteonecrosis of the jaw--pain, swelling, or  redness in the mouth, numbness of the jaw, poor healing after dental work, unusual discharge from the mouth, visible bones in the mouth Severe bone, joint, or muscle pain Side effects that usually do not require medical attention (report to your care team if they continue or are bothersome): Constipation Fatigue Fever Loss of appetite Nausea Stomach pain This list may not describe all possible side effects. Call your doctor for medical advice about side effects. You may report side effects to FDA at 1-800-FDA-1088. Where should I keep my medication? This medication is given in a hospital or clinic. It will not be stored at home. NOTE: This sheet is a  summary. It may not cover all possible information. If you have questions about this medicine, talk to your doctor, pharmacist, or health care provider.  2024 Elsevier/Gold Standard (2021-05-18 00:00:00)  Potassium Chloride Injection What is this medication? POTASSIUM CHLORIDE (poe TASS i um KLOOR ide) prevents and treats low levels of potassium in your body. Potassium plays an important role in maintaining the health of your kidneys, heart, muscles, and nervous system. This medicine may be used for other purposes; ask your health care provider or pharmacist if you have questions. COMMON BRAND NAME(S): PROAMP What should I tell my care team before I take this medication? They need to know if you have any of these conditions: Addison disease Dehydration Diabetes (high blood sugar) Heart disease High levels of potassium in the blood Irregular heartbeat or rhythm Kidney disease Large areas of burned skin An unusual or allergic reaction to potassium, other medications, foods, dyes, or preservatives Pregnant or trying to get pregnant Breast-feeding How should I use this medication? This medication is injected into a vein. It is given in a hospital or clinic setting. Talk to your care team about the use of this medication in children. Special care may be needed. Overdosage: If you think you have taken too much of this medicine contact a poison control center or emergency room at once. NOTE: This medicine is only for you. Do not share this medicine with others. What if I miss a dose? This does not apply. This medication is not for regular use. What may interact with this medication? Do not take this medication with any of the following: Certain diuretics, such as spironolactone, triamterene Eplerenone Sodium polystyrene sulfonate This medication may also interact with the following: Certain medications for blood pressure or heart disease, such as lisinopril, losartan, quinapril,  valsartan Medications that lower your chance of fighting infection, such as cyclosporine, tacrolimus NSAIDs, medications for pain and inflammation, such as ibuprofen or naproxen Other potassium supplements Salt substitutes This list may not describe all possible interactions. Give your health care provider a list of all the medicines, herbs, non-prescription drugs, or dietary supplements you use. Also tell them if you smoke, drink alcohol, or use illegal drugs. Some items may interact with your medicine. What should I watch for while using this medication? Visit your care team for regular checks on your progress. Tell your care team if your symptoms do not start to get better or if they get worse. You may need blood work while you are taking this medication. Avoid salt substitutes unless you are told otherwise by your care team. What side effects may I notice from receiving this medication? Side effects that you should report to your care team as soon as possible: Allergic reactions--skin rash, itching, hives, swelling of the face, lips, tongue, or throat High potassium level--muscle weakness, fast or irregular heartbeat  Side effects that usually do not require medical attention (report to your care team if they continue or are bothersome): Diarrhea Nausea Stomach pain Vomiting This list may not describe all possible side effects. Call your doctor for medical advice about side effects. You may report side effects to FDA at 1-800-FDA-1088. Where should I keep my medication? This medication is given in a hospital or clinic. It will not be stored at home. NOTE: This sheet is a summary. It may not cover all possible information. If you have questions about this medicine, talk to your doctor, pharmacist, or health care provider.  2024 Elsevier/Gold Standard (2021-10-05 00:00:00)  DIARRHEA Follow these instructions at home: Eating and drinking     Follow these instructions as told by your  doctor: Take an ORS (oral rehydration solution). This is a drink that helps you replace fluids and minerals your body lost. It is sold at pharmacies and stores. Drink enough fluid to keep your pee (urine) pale yellow. Drink fluids such as: Water. You can also get fluids by sucking on ice chips. Diluted fruit juice. Low-calorie sports drinks. Milk. Avoid drinking fluids that have a lot of sugar or caffeine in them. These include soda, energy drinks, and regular sports drinks. Avoid alcohol. Eat bland, easy-to-digest foods in small amounts as you are able. These foods include: Bananas. Applesauce. Rice. Low-fat (lean) meats. Toast. Crackers. Avoid spicy or fatty foods.

## 2023-04-14 ENCOUNTER — Telehealth: Payer: Self-pay | Admitting: Cardiology

## 2023-04-14 ENCOUNTER — Ambulatory Visit: Payer: Medicare PPO | Admitting: Physician Assistant

## 2023-04-14 NOTE — Telephone Encounter (Signed)
 Call to patient' son [name] (DPR), [name] states he cannot remember reason for appt tomorrow. Advised that patient has had difficulty with labile blood pressure due to chemo treatment, [name] verbalizes understanding.

## 2023-04-14 NOTE — Telephone Encounter (Signed)
 Calling with questions concerning patient appt for tomorrow. Please advise

## 2023-04-15 ENCOUNTER — Encounter: Payer: Self-pay | Admitting: Physician Assistant

## 2023-04-15 ENCOUNTER — Ambulatory Visit: Payer: Medicare PPO | Attending: Physician Assistant | Admitting: Physician Assistant

## 2023-04-15 VITALS — BP 128/68 | HR 65 | Ht 61.5 in | Wt 139.8 lb

## 2023-04-15 DIAGNOSIS — I6523 Occlusion and stenosis of bilateral carotid arteries: Secondary | ICD-10-CM | POA: Diagnosis not present

## 2023-04-15 DIAGNOSIS — E782 Mixed hyperlipidemia: Secondary | ICD-10-CM | POA: Diagnosis not present

## 2023-04-15 DIAGNOSIS — I272 Pulmonary hypertension, unspecified: Secondary | ICD-10-CM

## 2023-04-15 DIAGNOSIS — I2510 Atherosclerotic heart disease of native coronary artery without angina pectoris: Secondary | ICD-10-CM | POA: Diagnosis not present

## 2023-04-15 DIAGNOSIS — Q2381 Bicuspid aortic valve: Secondary | ICD-10-CM | POA: Diagnosis not present

## 2023-04-15 DIAGNOSIS — I10 Essential (primary) hypertension: Secondary | ICD-10-CM

## 2023-04-15 DIAGNOSIS — Q230 Congenital stenosis of aortic valve: Secondary | ICD-10-CM

## 2023-04-15 DIAGNOSIS — Z79899 Other long term (current) drug therapy: Secondary | ICD-10-CM | POA: Diagnosis not present

## 2023-04-15 DIAGNOSIS — I493 Ventricular premature depolarization: Secondary | ICD-10-CM

## 2023-04-15 NOTE — Progress Notes (Signed)
 Cardiology Office Note:  .   Date:  04/15/2023  ID:  [name], DOB January 01, 1941, MRN [id number] PCP: Dwight Trula SQUIBB, MD  [facility] Providers Cardiologist:  Wilbert Bihari, MD {  History of Present Illness: .   [name] is a 83 y.o. female with a past medical history of AAS, CAD here for follow-up appointment.  She was last seen by Dr. Bihari in 09/2021. She presented with her son. She had sacral decubitus which was painful. She has been going to the wound clinic. The patient denies chest pain but does experience some SOB especially when moving around a lot. This was chronic without a change. The patient denies episodes of syncope, orthopnea, and significant edema.   She was last seen by Glendia Ferrier, PA-C 12/31/22.   Today, she presents with a history of aortic stenosis and aortic valve replacement in 2015 for a routine follow-up. She reports that her previously painful sacral wound has now healed after regular visits to the wound clinic. The patient also mentions experiencing shortness of breath, but this is not a new symptom and only occurs when she exerts herself. She does not recall noticing or thinking that she had shortness of breath until recently. The patient also has a history of low potassium levels and has been taking potassium supplements. She has been monitored with regular echocardiograms and has had recent lab work done at a cancer center.  Reports no shortness of breath nor dyspnea on exertion. Reports no chest pain, pressure, or tightness. No edema, orthopnea, PND. Reports no palpitations.   Discussed the use of AI scribe software for clinical note transcription with the patient, who gave verbal consent to proceed.  ROS: pertinent ROS in HPI  Studies Reviewed: .       Echo 01/16/23 IMPRESSIONS     1. Left ventricular ejection fraction, by estimation, is 60 to 65%. The  left ventricle has normal function. The left ventricle has no regional  wall motion  abnormalities. Left ventricular diastolic parameters are  consistent with Grade I diastolic  dysfunction (impaired relaxation).   2. Right ventricular systolic function is normal. The right ventricular  size is normal. There is normal pulmonary artery systolic pressure. The  estimated right ventricular systolic pressure is 26.4 mmHg.   3. Left atrial size was moderately dilated.   4. The mitral valve is normal in structure. Mild mitral valve  regurgitation. No evidence of mitral stenosis.   5. The aortic valve has been repaired/replaced. Aortic valve  regurgitation is not visualized. No aortic stenosis is present. There is a  23 mm Edwards Magna-Ease pericardial valve valve present in the aortic  position. Procedure Date: 10/14/2013. Aortic  valve mean gradient measures 15.2 mmHg. Aortic valve Vmax measures 2.70  m/s.   6. Aortic dilatation noted. There is mild dilatation of the ascending  aorta, measuring 40 mm.   7. The inferior vena cava is normal in size with greater than 50%  respiratory variability, suggesting right atrial pressure of 3 mmHg.   Comparison(s): No significant change from prior study. Prior images  reviewed side by side. Prior AVR mean gradient 17 mmHg.   FINDINGS   Left Ventricle: Left ventricular ejection fraction, by estimation, is 60  to 65%. The left ventricle has normal function. The left ventricle has no  regional wall motion abnormalities. The left ventricular internal cavity  size was normal in size. There is   no left ventricular hypertrophy. Left ventricular diastolic parameters  are consistent with Grade I diastolic dysfunction (impaired relaxation).   Right Ventricle: The right ventricular size is normal. No increase in  right ventricular wall thickness. Right ventricular systolic function is  normal. There is normal pulmonary artery systolic pressure. The tricuspid  regurgitant velocity is 2.42 m/s, and   with an assumed right atrial pressure of 3  mmHg, the estimated right  ventricular systolic pressure is 26.4 mmHg.   Left Atrium: Left atrial size was moderately dilated.   Right Atrium: Right atrial size was normal in size.   Pericardium: There is no evidence of pericardial effusion.   Mitral Valve: The mitral valve is normal in structure. Mild mitral valve  regurgitation. No evidence of mitral valve stenosis.   Tricuspid Valve: The tricuspid valve is normal in structure. Tricuspid  valve regurgitation is mild . No evidence of tricuspid stenosis.   The aortic valve has been repaired/replaced. Aortic valve regurgitation is  not visualized. No aortic stenosis is present. There is a 23 mm Edwards  Magna-Ease pericardial valve valve present in the aortic position.  Procedure Date: 10/14/2013.  Pulmonic Valve: The pulmonic valve was normal in structure. Pulmonic valve  regurgitation is not visualized. No evidence of pulmonic stenosis.   Aorta: Aortic dilatation noted. There is mild dilatation of the ascending  aorta, measuring 40 mm.   Venous: The inferior vena cava is normal in size with greater than 50%  respiratory variability, suggesting right atrial pressure of 3 mmHg.   IAS/Shunts: No atrial level shunt detected by color flow Doppler.      Physical Exam:   VS:  BP 128/68   Pulse 65   Ht 5' 1.5 (1.562 m)   Wt 139 lb 12.8 oz (63.4 kg)   SpO2 97%   BMI 25.99 kg/m    Wt Readings from Last 3 Encounters:  04/15/23 139 lb 12.8 oz (63.4 kg)  04/01/23 141 lb 11.2 oz (64.3 kg)  03/03/23 139 lb 14.4 oz (63.5 kg)    GEN: Well nourished, well developed in no acute distress NECK: No JVD; No carotid bruits CARDIAC: RRR, 3/6 systolic murmurs, rubs, gallops RESPIRATORY:  Clear to auscultation without rales, wheezing or rhonchi  ABDOMEN: Soft, non-tender, non-distended EXTREMITIES:  No edema; No deformity   ASSESSMENT AND PLAN: .    Multiple myeloma -monitored closely at the cancer center  Sacral Wound Healed and no  longer requiring wound clinic visits. -No further action required at this time.  Shortness of Breath Occurs with exertion, but not a new symptom. Echocardiogram shows normal heart function and valves, suggesting the shortness of breath is not heart-related. -No changes to current management plan.  Aortic Stenosis and Aortic Dilation Stable with aortic valve functioning well post-replacement in 2015. Mild dilation of the aorta (40mm) noted on recent echocardiogram. -Continue annual echocardiograms to monitor aorta. -echo due 10/25  Hypokalemia Recent labs show low potassium levels despite supplementation. -Next labs to be drawn at the cancer center. Request copy of results to monitor potassium levels. Consider changing to liquid potassium supplement if patient struggles with pill form.  Blood Pressure Management Blood pressure well-controlled on Losartan  25mg . -Check refills and renew prescription as needed.  General Health Maintenance / Followup Plans -Schedule follow-up appointment with Dr. Shlomo in six months. -Continue home blood pressure monitoring with upper arm cuff for accuracy.    Signed, Orren LOISE Fabry, PA-C

## 2023-04-15 NOTE — Patient Instructions (Signed)
 Medication Instructions:  Your physician recommends that you continue on your current medications as directed. Please refer to the Current Medication list given to you today.  *If you need a refill on your cardiac medications before your next appointment, please call your pharmacy*  Lab Work: None ordered today. If you have labs (blood work) drawn today and your tests are completely normal, you will receive your results only by: MyChart Message (if you have MyChart) OR A paper copy in the mail If you have any lab test that is abnormal or we need to change your treatment, we will call you to review the results.  Testing/Procedures: None ordered today.  Follow-Up: At [facility], you and your health needs are our priority.  As part of our continuing mission to provide you with exceptional heart care, we have created designated Provider Care Teams.  These Care Teams include your primary Cardiologist (physician) and Advanced Practice Providers (APPs -  Physician Assistants and Nurse Practitioners) who all work together to provide you with the care you need, when you need it.   Your next appointment:   6 month(s)  The format for your next appointment:   In Person  Provider:   Wilbert Bihari, MD {

## 2023-04-22 ENCOUNTER — Other Ambulatory Visit: Payer: Self-pay | Admitting: Physician Assistant

## 2023-04-22 DIAGNOSIS — C9000 Multiple myeloma not having achieved remission: Secondary | ICD-10-CM

## 2023-04-22 DIAGNOSIS — H524 Presbyopia: Secondary | ICD-10-CM | POA: Diagnosis not present

## 2023-04-22 DIAGNOSIS — H26493 Other secondary cataract, bilateral: Secondary | ICD-10-CM | POA: Diagnosis not present

## 2023-04-22 DIAGNOSIS — H04123 Dry eye syndrome of bilateral lacrimal glands: Secondary | ICD-10-CM | POA: Diagnosis not present

## 2023-04-22 DIAGNOSIS — H40013 Open angle with borderline findings, low risk, bilateral: Secondary | ICD-10-CM | POA: Diagnosis not present

## 2023-04-22 DIAGNOSIS — H353132 Nonexudative age-related macular degeneration, bilateral, intermediate dry stage: Secondary | ICD-10-CM | POA: Diagnosis not present

## 2023-04-23 ENCOUNTER — Other Ambulatory Visit: Payer: Self-pay | Admitting: Internal Medicine

## 2023-04-23 DIAGNOSIS — C9000 Multiple myeloma not having achieved remission: Secondary | ICD-10-CM

## 2023-04-24 ENCOUNTER — Other Ambulatory Visit: Payer: Self-pay

## 2023-04-24 DIAGNOSIS — C9000 Multiple myeloma not having achieved remission: Secondary | ICD-10-CM

## 2023-04-28 ENCOUNTER — Ambulatory Visit: Payer: Medicare PPO

## 2023-04-28 ENCOUNTER — Other Ambulatory Visit: Payer: Medicare PPO

## 2023-04-28 ENCOUNTER — Ambulatory Visit: Payer: Medicare PPO | Admitting: Internal Medicine

## 2023-04-28 ENCOUNTER — Ambulatory Visit: Payer: Medicare PPO | Admitting: Physician Assistant

## 2023-04-28 MED ORDER — LENALIDOMIDE 15 MG PO CAPS: 15.0000 mg | ORAL_CAPSULE | Freq: Every day | ORAL | 0 refills | Status: DC

## 2023-04-28 NOTE — Addendum Note (Signed)
Addended by: Charma Igo on: 04/28/2023 10:55 AM   Modules accepted: Orders

## 2023-04-30 ENCOUNTER — Inpatient Hospital Stay: Payer: Medicare PPO | Attending: Internal Medicine | Admitting: Internal Medicine

## 2023-04-30 ENCOUNTER — Inpatient Hospital Stay: Payer: Medicare PPO

## 2023-04-30 VITALS — BP 188/58 | HR 51 | Temp 97.7°F | Resp 16 | Ht 61.5 in | Wt 138.5 lb

## 2023-04-30 VITALS — BP 118/47

## 2023-04-30 DIAGNOSIS — I10 Essential (primary) hypertension: Secondary | ICD-10-CM | POA: Diagnosis not present

## 2023-04-30 DIAGNOSIS — C9000 Multiple myeloma not having achieved remission: Secondary | ICD-10-CM | POA: Insufficient documentation

## 2023-04-30 DIAGNOSIS — Z5112 Encounter for antineoplastic immunotherapy: Secondary | ICD-10-CM | POA: Insufficient documentation

## 2023-04-30 LAB — CBC WITH DIFFERENTIAL (CANCER CENTER ONLY)
Abs Immature Granulocytes: 0.02 10*3/uL (ref 0.00–0.07)
Basophils Absolute: 0.2 10*3/uL — ABNORMAL HIGH (ref 0.0–0.1)
Basophils Relative: 3 %
Eosinophils Absolute: 0.1 10*3/uL (ref 0.0–0.5)
Eosinophils Relative: 2 %
HCT: 36.7 % (ref 36.0–46.0)
Hemoglobin: 11.8 g/dL — ABNORMAL LOW (ref 12.0–15.0)
Immature Granulocytes: 0 %
Lymphocytes Relative: 24 %
Lymphs Abs: 2.1 10*3/uL (ref 0.7–4.0)
MCH: 31.9 pg (ref 26.0–34.0)
MCHC: 32.2 g/dL (ref 30.0–36.0)
MCV: 99.2 fL (ref 80.0–100.0)
Monocytes Absolute: 0.7 10*3/uL (ref 0.1–1.0)
Monocytes Relative: 8 %
Neutro Abs: 5.7 10*3/uL (ref 1.7–7.7)
Neutrophils Relative %: 63 %
Platelet Count: 195 10*3/uL (ref 150–400)
RBC: 3.7 MIL/uL — ABNORMAL LOW (ref 3.87–5.11)
RDW: 15.3 % (ref 11.5–15.5)
WBC Count: 8.9 10*3/uL (ref 4.0–10.5)
nRBC: 0 % (ref 0.0–0.2)

## 2023-04-30 LAB — CMP (CANCER CENTER ONLY)
ALT: 19 U/L (ref 0–44)
AST: 18 U/L (ref 15–41)
Albumin: 3.7 g/dL (ref 3.5–5.0)
Alkaline Phosphatase: 84 U/L (ref 38–126)
Anion gap: 6 (ref 5–15)
BUN: 13 mg/dL (ref 8–23)
CO2: 25 mmol/L (ref 22–32)
Calcium: 8.5 mg/dL — ABNORMAL LOW (ref 8.9–10.3)
Chloride: 111 mmol/L (ref 98–111)
Creatinine: 0.74 mg/dL (ref 0.44–1.00)
GFR, Estimated: >60 mL/min (ref >60)
Glucose, Bld: 97 mg/dL (ref 70–99)
Potassium: 3.7 mmol/L (ref 3.5–5.1)
Sodium: 142 mmol/L (ref 135–145)
Total Bilirubin: 0.7 mg/dL (ref 0.0–1.2)
Total Protein: 5.6 g/dL — ABNORMAL LOW (ref 6.5–8.1)

## 2023-04-30 LAB — LACTATE DEHYDROGENASE: LDH: 184 U/L (ref 98–192)

## 2023-04-30 MED ORDER — ACETAMINOPHEN 325 MG PO TABS
650.0000 mg | ORAL_TABLET | Freq: Once | ORAL | Status: AC
  Administered 2023-04-30: 650 mg via ORAL
  Filled 2023-04-30: qty 2

## 2023-04-30 MED ORDER — CLONIDINE HCL 0.1 MG PO TABS
0.2000 mg | ORAL_TABLET | Freq: Once | ORAL | Status: AC
  Administered 2023-04-30: 0.2 mg via ORAL
  Filled 2023-04-30: qty 2

## 2023-04-30 MED ORDER — SODIUM CHLORIDE 0.9 % IV SOLN: Freq: Once | INTRAVENOUS | Status: AC

## 2023-04-30 MED ORDER — ZOLEDRONIC ACID 4 MG/100ML IV SOLN
4.0000 mg | Freq: Once | INTRAVENOUS | Status: AC
  Administered 2023-04-30: 4 mg via INTRAVENOUS
  Filled 2023-04-30: qty 100

## 2023-04-30 MED ORDER — DIPHENHYDRAMINE HCL 25 MG PO CAPS
50.0000 mg | ORAL_CAPSULE | Freq: Once | ORAL | Status: AC
  Administered 2023-04-30: 50 mg via ORAL
  Filled 2023-04-30: qty 2

## 2023-04-30 MED ORDER — DARATUMUMAB-HYALURONIDASE-FIHJ 1800-30000 MG-UT/15ML ~~LOC~~ SOLN
1800.0000 mg | Freq: Once | SUBCUTANEOUS | Status: AC
  Administered 2023-04-30: 1800 mg via SUBCUTANEOUS
  Filled 2023-04-30: qty 15

## 2023-04-30 MED ORDER — DEXAMETHASONE 4 MG PO TABS
20.0000 mg | ORAL_TABLET | Freq: Once | ORAL | Status: AC
  Administered 2023-04-30: 20 mg via ORAL
  Filled 2023-04-30: qty 5

## 2023-04-30 NOTE — Patient Instructions (Signed)
 [facility] - A DEPT OF [facility]  Discharge Instructions: Thank you for choosing [facility] to provide your oncology and hematology care.   If you have a lab appointment with the Cancer Center, please go directly to the Cancer Center and check in at the registration area.   Wear comfortable clothing and clothing appropriate for easy access to any Portacath or PICC line.   We strive to give you quality time with your provider. You may need to reschedule your appointment if you arrive late (15 or more minutes).  Arriving late affects you and other patients whose appointments are after yours.  Also, if you miss three or more appointments without notifying the office, you may be dismissed from the clinic at the provider's discretion.      For prescription refill requests, have your pharmacy contact our office and allow 72 hours for refills to be completed.    Today you received the following chemotherapy and/or immunotherapy agents: Darzalex Faspro      To help prevent nausea and vomiting after your treatment, we encourage you to take your nausea medication as directed.  BELOW ARE SYMPTOMS THAT SHOULD BE REPORTED IMMEDIATELY: *FEVER GREATER THAN 100.4 F (38 C) OR HIGHER *CHILLS OR SWEATING *NAUSEA AND VOMITING THAT IS NOT CONTROLLED WITH YOUR NAUSEA MEDICATION *UNUSUAL SHORTNESS OF BREATH *UNUSUAL BRUISING OR BLEEDING *URINARY PROBLEMS (pain or burning when urinating, or frequent urination) *BOWEL PROBLEMS (unusual diarrhea, constipation, pain near the anus) TENDERNESS IN MOUTH AND THROAT WITH OR WITHOUT PRESENCE OF ULCERS (sore throat, sores in mouth, or a toothache) UNUSUAL RASH, SWELLING OR PAIN  UNUSUAL VAGINAL DISCHARGE OR ITCHING   Items with * indicate a potential emergency and should be followed up as soon as possible or go to the Emergency Department if any problems should occur.  Please show the CHEMOTHERAPY ALERT CARD or  IMMUNOTHERAPY ALERT CARD at check-in to the Emergency Department and triage nurse.  Should you have questions after your visit or need to cancel or reschedule your appointment, please contact [facility] - A DEPT OF [facility]  Dept: [phone]  and follow the prompts.  Office hours are 8:00 a.m. to 4:30 p.m. Monday - Friday. Please note that voicemails left after 4:00 p.m. may not be returned until the following business day.  We are closed weekends and major holidays. You have access to a nurse at all times for urgent questions. Please call the main number to the clinic Dept: [phone] and follow the prompts.   For any non-urgent questions, you may also contact your provider using MyChart. We now offer e-Visits for anyone 31 and older to request care online for non-urgent symptoms. For details visit [email or web address].   Also download the MyChart app! Go to the app store, search "MyChart", open the app, select [facility], and log in with your MyChart username and password.  Zoledronic Acid Injection (Cancer) What is this medication? ZOLEDRONIC ACID (ZOE le dron ik AS id) treats high calcium levels in the blood caused by cancer. It may also be used with chemotherapy to treat weakened bones caused by cancer. It works by slowing down the release of calcium from bones. This lowers calcium levels in your blood. It also makes your bones stronger and less likely to break (fracture). It belongs to a group of medications called bisphosphonates. This medicine may be used for other purposes; ask your health care  provider or pharmacist if you have questions. COMMON BRAND NAME(S): Zometa, Zometa Powder What should I tell my care team before I take this medication? They need to know if you have any of these conditions: Dehydration Dental disease Kidney disease Liver disease Low levels of calcium in the blood Lung or breathing disease, such as asthma Receiving  steroids, such as dexamethasone or prednisone An unusual or allergic reaction to zoledronic acid, other medications, foods, dyes, or preservatives Pregnant or trying to get pregnant Breast-feeding How should I use this medication? This medication is injected into a vein. It is given by your care team in a hospital or clinic setting. Talk to your care team about the use of this medication in children. Special care may be needed. Overdosage: If you think you have taken too much of this medicine contact a poison control center or emergency room at once. NOTE: This medicine is only for you. Do not share this medicine with others. What if I miss a dose? Keep appointments for follow-up doses. It is important not to miss your dose. Call your care team if you are unable to keep an appointment. What may interact with this medication? Certain antibiotics given by injection Diuretics, such as bumetanide, furosemide NSAIDs, medications for pain and inflammation, such as ibuprofen or naproxen Teriparatide Thalidomide This list may not describe all possible interactions. Give your health care provider a list of all the medicines, herbs, non-prescription drugs, or dietary supplements you use. Also tell them if you smoke, drink alcohol, or use illegal drugs. Some items may interact with your medicine. What should I watch for while using this medication? Visit your care team for regular checks on your progress. It may be some time before you see the benefit from this medication. Some people who take this medication have severe bone, joint, or muscle pain. This medication may also increase your risk for jaw problems or a broken thigh bone. Tell your care team right away if you have severe pain in your jaw, bones, joints, or muscles. Tell you care team if you have any pain that does not go away or that gets worse. Tell your dentist and dental surgeon that you are taking this medication. You should not have major  dental surgery while on this medication. See your dentist to have a dental exam and fix any dental problems before starting this medication. Take good care of your teeth while on this medication. Make sure you see your dentist for regular follow-up appointments. You should make sure you get enough calcium and vitamin D while you are taking this medication. Discuss the foods you eat and the vitamins you take with your care team. Check with your care team if you have severe diarrhea, nausea, and vomiting, or if you sweat a lot. The loss of too much body fluid may make it dangerous for you to take this medication. You may need bloodwork while taking this medication. Talk to your care team if you wish to become pregnant or think you might be pregnant. This medication can cause serious birth defects. What side effects may I notice from receiving this medication? Side effects that you should report to your care team as soon as possible: Allergic reactions--skin rash, itching, hives, swelling of the face, lips, tongue, or throat Kidney injury--decrease in the amount of urine, swelling of the ankles, hands, or feet Low calcium level--muscle pain or cramps, confusion, tingling, or numbness in the hands or feet Osteonecrosis of the jaw--pain, swelling, or  redness in the mouth, numbness of the jaw, poor healing after dental work, unusual discharge from the mouth, visible bones in the mouth Severe bone, joint, or muscle pain Side effects that usually do not require medical attention (report to your care team if they continue or are bothersome): Constipation Fatigue Fever Loss of appetite Nausea Stomach pain This list may not describe all possible side effects. Call your doctor for medical advice about side effects. You may report side effects to FDA at 1-800-FDA-1088. Where should I keep my medication? This medication is given in a hospital or clinic. It will not be stored at home. NOTE: This sheet is a  summary. It may not cover all possible information. If you have questions about this medicine, talk to your doctor, pharmacist, or health care provider.  2024 Elsevier/Gold Standard (2021-05-18 00:00:00)  Potassium Chloride Injection What is this medication? POTASSIUM CHLORIDE (poe TASS i um KLOOR ide) prevents and treats low levels of potassium in your body. Potassium plays an important role in maintaining the health of your kidneys, heart, muscles, and nervous system. This medicine may be used for other purposes; ask your health care provider or pharmacist if you have questions. COMMON BRAND NAME(S): PROAMP What should I tell my care team before I take this medication? They need to know if you have any of these conditions: Addison disease Dehydration Diabetes (high blood sugar) Heart disease High levels of potassium in the blood Irregular heartbeat or rhythm Kidney disease Large areas of burned skin An unusual or allergic reaction to potassium, other medications, foods, dyes, or preservatives Pregnant or trying to get pregnant Breast-feeding How should I use this medication? This medication is injected into a vein. It is given in a hospital or clinic setting. Talk to your care team about the use of this medication in children. Special care may be needed. Overdosage: If you think you have taken too much of this medicine contact a poison control center or emergency room at once. NOTE: This medicine is only for you. Do not share this medicine with others. What if I miss a dose? This does not apply. This medication is not for regular use. What may interact with this medication? Do not take this medication with any of the following: Certain diuretics, such as spironolactone, triamterene Eplerenone Sodium polystyrene sulfonate This medication may also interact with the following: Certain medications for blood pressure or heart disease, such as lisinopril, losartan, quinapril,  valsartan Medications that lower your chance of fighting infection, such as cyclosporine, tacrolimus NSAIDs, medications for pain and inflammation, such as ibuprofen or naproxen Other potassium supplements Salt substitutes This list may not describe all possible interactions. Give your health care provider a list of all the medicines, herbs, non-prescription drugs, or dietary supplements you use. Also tell them if you smoke, drink alcohol, or use illegal drugs. Some items may interact with your medicine. What should I watch for while using this medication? Visit your care team for regular checks on your progress. Tell your care team if your symptoms do not start to get better or if they get worse. You may need blood work while you are taking this medication. Avoid salt substitutes unless you are told otherwise by your care team. What side effects may I notice from receiving this medication? Side effects that you should report to your care team as soon as possible: Allergic reactions--skin rash, itching, hives, swelling of the face, lips, tongue, or throat High potassium level--muscle weakness, fast or irregular heartbeat  Side effects that usually do not require medical attention (report to your care team if they continue or are bothersome): Diarrhea Nausea Stomach pain Vomiting This list may not describe all possible side effects. Call your doctor for medical advice about side effects. You may report side effects to FDA at 1-800-FDA-1088. Where should I keep my medication? This medication is given in a hospital or clinic. It will not be stored at home. NOTE: This sheet is a summary. It may not cover all possible information. If you have questions about this medicine, talk to your doctor, pharmacist, or health care provider.  2024 Elsevier/Gold Standard (2021-10-05 00:00:00)  DIARRHEA Follow these instructions at home: Eating and drinking     Follow these instructions as told by your  doctor: Take an ORS (oral rehydration solution). This is a drink that helps you replace fluids and minerals your body lost. It is sold at pharmacies and stores. Drink enough fluid to keep your pee (urine) pale yellow. Drink fluids such as: Water. You can also get fluids by sucking on ice chips. Diluted fruit juice. Low-calorie sports drinks. Milk. Avoid drinking fluids that have a lot of sugar or caffeine in them. These include soda, energy drinks, and regular sports drinks. Avoid alcohol. Eat bland, easy-to-digest foods in small amounts as you are able. These foods include: Bananas. Applesauce. Rice. Low-fat (lean) meats. Toast. Crackers. Avoid spicy or fatty foods.

## 2023-04-30 NOTE — Progress Notes (Signed)
Pt reports not taking BP medicine at home this morning.  Dr Shirline Frees aware.  Clonidine 0.2mg  po order placed by Nelly Laurence.

## 2023-04-30 NOTE — Progress Notes (Signed)
[facility] [phone]   [phone]  OFFICE PROGRESS NOTE  [name], MD 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 Dorris Fetch on January 28, 2022.    CURRENT THERAPY:  1) Starting systemic therapy with Revlimid 21 days on 7 days off, Darzalex, and 20 mg of Decadron weekly.  First dose expected next week on 09/11/2022.  Status post 8 cycles.  2) Monthly Zometa injections for 6 to 12 months, followed by every 3 months thereon after once dental clearance is obtained.  INTERVAL HISTORY: [name] 83 y.o. female returns to the clinic today for follow-up visit. Discussed the use of AI scribe software for clinical note transcription with the patient, who gave verbal consent to proceed.  History of Present Illness   The patient, an 83 year old with a history of stage 2 multiple myeloma and stage 1 thymoma, has been undergoing treatment with subcutaneous daratumumab, and Decadron every four weeks. She has completed eight cycles of this regimen so far. The thymoma was surgically resected in October 2023. She also receives Zometa infusions for bone disease every four weeks.  Overall, the patient reports feeling "pretty good" and is still active. However, she experienced a single episode of vomiting last week, the cause of which is unclear. She did not report any chest pain, breathing issues, or diarrhea.  The patient also has a history of hypertension. On the day of the consultation, her blood pressure was noted to be on the higher side. She admitted to not taking her prescribed blood pressure medication,  Cozaar (losartan potassium), on that day.      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 by echo 10/2021   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/2015   Carotid artery stenosis    < 50% stenosis bilaterally by doppler 07/2016   Coronary artery disease 2008   Coronary Ca score of 331 with minimal multivessel plaque < 25% stenosis by coronary CTA 8/23   Heart murmur    per pt   Hypercholesteremia    Hypertension    Obesity    Osteopenia    declines treatment   Pneumonia 1995   Pulmonary HTN (HCC)    mild to moderate by echo 7/23 with PASP   Shoulder pain    Due to arthritis   Vitamin D insufficiency     ALLERGIES:  is allergic to crestor [rosuvastatin calcium], lactose, lipitor [atorvastatin], pravastatin, simvastatin, tramadol, zetia [ezetimibe], codeine, penicillins, sulfa antibiotics, and vancomycin.  MEDICATIONS:  Current Outpatient Medications  Medication Sig Dispense Refill   acetaminophen (TYLENOL) 325 MG tablet Take 2 tablets (650 mg total) by mouth every 8 (eight) hours as needed.     acyclovir (ZOVIRAX) 200 MG capsule Take 1 capsule (200 mg total) by mouth 2 (two) times daily. 30 capsule  3   allopurinol (ZYLOPRIM) 100 MG tablet Take 1 tablet by mouth twice daily 60 tablet 0   daratumumab-hyaluronidase-fihj (DARZALEX FASPRO) 1800-30000 MG-UT/15ML SOLN Inject 1,800 mg into the skin once.     dexamethasone (DECADRON) 4 MG tablet Please take 5 tablets (20 mg) weekly on the days of treatment 40 tablet 3   diphenoxylate-atropine (LOMOTIL) 2.5-0.025 MG tablet Take 1 tablet by mouth 4 (four) times daily as needed for diarrhea or loose stools. 30 tablet 0   lenalidomide (REVLIMID) 15 MG capsule Take 1 capsule (15 mg total)  by mouth daily. [employer] Auth # [id number]     Date Obtained 04/24/2023 21 capsule 0   losartan (COZAAR) 25 MG tablet Take 1 tablet by mouth once daily 30 tablet 8   Multiple Vitamin (MULTIVITAMIN WITH MINERALS) TABS tablet Take 1 tablet by mouth daily.     potassium chloride SA (KLOR-CON M) 20 MEQ tablet Take 1 tablet (20 mEq total) by mouth daily. 7 tablet 0   No current facility-administered medications for this visit.    SURGICAL HISTORY:  Past Surgical History:  Procedure Laterality Date   AORTIC VALVE REPLACEMENT N/A 10/14/2013   Procedure: AORTIC VALVE REPLACEMENT (AVR);  Surgeon: Alleen Borne, MD;  Location: [facility] OR;  Service: Open Heart Surgery;  Laterality: N/A;   CARDIAC CATHETERIZATION     CATARACT EXTRACTION, BILATERAL     CHOLECYSTECTOMY  04/08/1988   ESOPHAGOGASTRODUODENOSCOPY (EGD) WITH PROPOFOL N/A 09/05/2022   Procedure: ESOPHAGOGASTRODUODENOSCOPY (EGD) WITH PROPOFOL;  Surgeon: Kathi Der, MD;  Location: [facility] ENDOSCOPY;  Service: Gastroenterology;  Laterality: N/A;   INTRAOPERATIVE TRANSESOPHAGEAL ECHOCARDIOGRAM N/A 10/14/2013   Procedure: INTRAOPERATIVE TRANSESOPHAGEAL ECHOCARDIOGRAM;  Surgeon: Alleen Borne, MD;  Location: [facility] OR;  Service: Open Heart Surgery;  Laterality: N/A;   IVC FILTER INSERTION N/A 09/06/2022   Procedure: IVC FILTER INSERTION;  Surgeon: Leonie Douglas, MD;  Location: [facility] INVASIVE CV LAB;  Service: Cardiovascular;  Laterality: N/A;   KYPHOPLASTY Bilateral 03/27/2022   Procedure: KYPHOPLASTY AND BIOPSY THORACIC ELEVEN;  Surgeon: Lisbeth Renshaw, MD;  Location: [facility] OR;  Service: Neurosurgery;  Laterality: Bilateral;   LEFT AND RIGHT HEART CATHETERIZATION WITH CORONARY ANGIOGRAM N/A 09/16/2013   Procedure: LEFT AND RIGHT HEART CATHETERIZATION WITH CORONARY ANGIOGRAM;  Surgeon: Quintella Reichert, MD;  Location: [facility] CATH LAB;  Service: Cardiovascular;  Laterality: N/A;   TONSILLECTOMY      REVIEW OF SYSTEMS:  Constitutional: positive for fatigue Eyes:  negative Ears, nose, mouth, throat, and face: negative Respiratory: negative Cardiovascular: negative Gastrointestinal: negative Genitourinary:negative Integument/breast: negative Hematologic/lymphatic: negative Musculoskeletal:negative Neurological: negative Behavioral/Psych: negative Endocrine: negative Allergic/Immunologic: negative   PHYSICAL EXAMINATION: General appearance: alert, cooperative, fatigued, and no distress Head: Normocephalic, without obvious abnormality, atraumatic Neck: no adenopathy, no JVD, supple, symmetrical, trachea midline, and thyroid not enlarged, symmetric, no tenderness/mass/nodules Lymph nodes: Cervical, supraclavicular, and axillary nodes normal. Resp: clear to auscultation bilaterally Back: symmetric, no curvature. ROM normal. No CVA tenderness. Cardio: regular rate and rhythm, S1, S2 normal, no murmur, click, rub or gallop GI: soft, non-tender; bowel sounds normal; no masses,  no organomegaly Extremities: extremities normal, atraumatic, no cyanosis or edema Neurologic: Alert and oriented X 3, normal strength and tone. Normal symmetric reflexes. Normal coordination and gait  ECOG PERFORMANCE STATUS: 1 - Symptomatic but completely ambulatory  Blood pressure (!) 188/58, pulse (!) 51, temperature 97.7 F (36.5 C), temperature source Temporal, resp. rate 16, height 5' 1.5" (1.562 m), weight 138 lb 8 oz (62.8 kg), SpO2 99%.  LABORATORY DATA:  Lab Results  Component Value Date   WBC 8.9 04/30/2023   HGB 11.8 (L) 04/30/2023   HCT 36.7 04/30/2023   MCV 99.2 04/30/2023   PLT 195 04/30/2023      Chemistry      Component Value Date/Time   NA 142 04/30/2023 1016   NA 145 (H) 02/27/2023 1140   K 3.7 04/30/2023 1016   CL 111 04/30/2023 1016   CO2 25 04/30/2023 1016   BUN 13 04/30/2023 1016   BUN 22 02/27/2023 1140   CREATININE 0.74 04/30/2023 1016   CREATININE 0.85 01/29/2016 0910      Component Value Date/Time   CALCIUM 8.5 (L) 04/30/2023 1016    ALKPHOS 84 04/30/2023 1016   AST 18 04/30/2023 1016   ALT 19 04/30/2023 1016   BILITOT 0.7 04/30/2023 1016       RADIOGRAPHIC STUDIES: No results found.  ASSESSMENT AND PLAN: This is a very pleasant 83 years old white female with  1) history of stage I (T1a, N0, M0) thymoma type AB diagnosed in October 2023 status post resection under the care of Dr. Dorris Fetch on January 28, 2022 with close resection margin and microscopic infiltration of the capsule. 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  The patient is undergoing systemic therapy with daratumumab, Revlimid 15 mg p.o. daily for 21 days every 4 weeks in addition to Decadron 20 mg weekly with the treatment.  Status post 8 cycles.  She has been tolerating her treatment fairly well.    Multiple Myeloma (Stage II) Diagnosed in May 2024. Currently on subcutaneous daratumumab, and Decadron every four weeks. Completed eight cycles with favorable lab results. Discussed continuation of current regimen, benefits of disease control, and potential side effects such as nausea and vomiting. Experienced one episode of vomiting last week, etiology unclear. Consents to continue treatment. - Administer cycle nine of treatment today - Continue Zometa infusion for bone disease every four weeks  Hypertension Blood pressure elevated today. Non-adherence to Cozaar (losartan potassium) noted. Discussed importance of medication adherence to manage blood pressure and prevent complications. Consents to receive clonidine today. - Administer 0.2 mg clonidine - Ensure adherence to Cozaar (losartan potassium)  Thymoma (Stage I) Resected in October 2023. No current issues.   The patient was advised to call immediately if she has any other concerning symptoms in the interval. The patient voices understanding of current disease status and treatment options and is in agreement with the current care plan.  All questions were answered. The  patient knows to call the clinic with any problems, questions or concerns. We can certainly see the patient much sooner if necessary.  The total time spent in the appointment was 30 minutes.  Disclaimer: This note was dictated with voice recognition software. Similar sounding words can inadvertently be transcribed and may not be corrected upon review.

## 2023-05-01 ENCOUNTER — Other Ambulatory Visit: Payer: Self-pay | Admitting: Medical Oncology

## 2023-05-01 ENCOUNTER — Encounter: Payer: Self-pay | Admitting: Internal Medicine

## 2023-05-01 DIAGNOSIS — C9000 Multiple myeloma not having achieved remission: Secondary | ICD-10-CM

## 2023-05-01 DIAGNOSIS — D509 Iron deficiency anemia, unspecified: Secondary | ICD-10-CM | POA: Diagnosis not present

## 2023-05-01 DIAGNOSIS — R197 Diarrhea, unspecified: Secondary | ICD-10-CM | POA: Diagnosis not present

## 2023-05-01 MED ORDER — ACYCLOVIR 200 MG PO CAPS: 200.0000 mg | ORAL_CAPSULE | Freq: Two times a day (BID) | ORAL | 3 refills | Status: DC

## 2023-05-12 DIAGNOSIS — R197 Diarrhea, unspecified: Secondary | ICD-10-CM | POA: Diagnosis not present

## 2023-05-18 ENCOUNTER — Other Ambulatory Visit: Payer: Self-pay | Admitting: Internal Medicine

## 2023-05-18 DIAGNOSIS — C9000 Multiple myeloma not having achieved remission: Secondary | ICD-10-CM

## 2023-05-19 MED ORDER — LENALIDOMIDE 15 MG PO CAPS: 15.0000 mg | ORAL_CAPSULE | ORAL | 0 refills | Status: DC

## 2023-05-19 NOTE — Addendum Note (Signed)
 Addended by: Bonnie Butters on: 05/19/2023 01:02 PM   Modules accepted: Orders

## 2023-05-22 DIAGNOSIS — N1831 Chronic kidney disease, stage 3a: Secondary | ICD-10-CM | POA: Diagnosis not present

## 2023-05-22 DIAGNOSIS — C9000 Multiple myeloma not having achieved remission: Secondary | ICD-10-CM | POA: Diagnosis not present

## 2023-05-22 DIAGNOSIS — Z1331 Encounter for screening for depression: Secondary | ICD-10-CM | POA: Diagnosis not present

## 2023-05-22 DIAGNOSIS — Z23 Encounter for immunization: Secondary | ICD-10-CM | POA: Diagnosis not present

## 2023-05-22 DIAGNOSIS — Z0000 Encounter for general adult medical examination without abnormal findings: Secondary | ICD-10-CM | POA: Diagnosis not present

## 2023-05-22 DIAGNOSIS — I2510 Atherosclerotic heart disease of native coronary artery without angina pectoris: Secondary | ICD-10-CM | POA: Diagnosis not present

## 2023-05-22 DIAGNOSIS — Z952 Presence of prosthetic heart valve: Secondary | ICD-10-CM | POA: Diagnosis not present

## 2023-05-22 DIAGNOSIS — Z87898 Personal history of other specified conditions: Secondary | ICD-10-CM | POA: Diagnosis not present

## 2023-05-22 DIAGNOSIS — R5381 Other malaise: Secondary | ICD-10-CM | POA: Diagnosis not present

## 2023-05-22 DIAGNOSIS — K529 Noninfective gastroenteritis and colitis, unspecified: Secondary | ICD-10-CM | POA: Diagnosis not present

## 2023-05-22 DIAGNOSIS — I10 Essential (primary) hypertension: Secondary | ICD-10-CM | POA: Diagnosis not present

## 2023-05-23 ENCOUNTER — Other Ambulatory Visit: Payer: Self-pay | Admitting: Physician Assistant

## 2023-05-23 ENCOUNTER — Other Ambulatory Visit: Payer: Self-pay | Admitting: Student

## 2023-05-23 DIAGNOSIS — R195 Other fecal abnormalities: Secondary | ICD-10-CM

## 2023-05-23 DIAGNOSIS — C9000 Multiple myeloma not having achieved remission: Secondary | ICD-10-CM

## 2023-05-26 ENCOUNTER — Ambulatory Visit: Payer: Medicare PPO | Admitting: Internal Medicine

## 2023-05-26 ENCOUNTER — Other Ambulatory Visit: Payer: Medicare PPO

## 2023-05-26 ENCOUNTER — Ambulatory Visit: Payer: Medicare PPO

## 2023-05-28 ENCOUNTER — Inpatient Hospital Stay: Payer: Medicare PPO

## 2023-05-28 ENCOUNTER — Inpatient Hospital Stay: Payer: Medicare PPO | Admitting: Internal Medicine

## 2023-05-28 ENCOUNTER — Inpatient Hospital Stay: Payer: Medicare PPO | Attending: Internal Medicine

## 2023-05-28 VITALS — BP 172/68 | HR 54 | Temp 97.7°F | Resp 18

## 2023-05-28 VITALS — BP 170/51 | HR 52 | Temp 97.2°F | Resp 16 | Wt 140.0 lb

## 2023-05-28 DIAGNOSIS — Z5112 Encounter for antineoplastic immunotherapy: Secondary | ICD-10-CM | POA: Insufficient documentation

## 2023-05-28 DIAGNOSIS — Z79624 Long term (current) use of inhibitors of nucleotide synthesis: Secondary | ICD-10-CM | POA: Insufficient documentation

## 2023-05-28 DIAGNOSIS — C9000 Multiple myeloma not having achieved remission: Secondary | ICD-10-CM

## 2023-05-28 DIAGNOSIS — Z7961 Long term (current) use of immunomodulator: Secondary | ICD-10-CM | POA: Insufficient documentation

## 2023-05-28 DIAGNOSIS — Z79899 Other long term (current) drug therapy: Secondary | ICD-10-CM | POA: Diagnosis not present

## 2023-05-28 LAB — CBC WITH DIFFERENTIAL (CANCER CENTER ONLY)
Abs Immature Granulocytes: 0.03 10*3/uL (ref 0.00–0.07)
Basophils Absolute: 0.1 10*3/uL (ref 0.0–0.1)
Basophils Relative: 2 %
Eosinophils Absolute: 0.1 10*3/uL (ref 0.0–0.5)
Eosinophils Relative: 1 %
HCT: 35.2 % — ABNORMAL LOW (ref 36.0–46.0)
Hemoglobin: 11.7 g/dL — ABNORMAL LOW (ref 12.0–15.0)
Immature Granulocytes: 0 %
Lymphocytes Relative: 25 %
Lymphs Abs: 2.1 10*3/uL (ref 0.7–4.0)
MCH: 32.9 pg (ref 26.0–34.0)
MCHC: 33.2 g/dL (ref 30.0–36.0)
MCV: 98.9 fL (ref 80.0–100.0)
Monocytes Absolute: 1 10*3/uL (ref 0.1–1.0)
Monocytes Relative: 12 %
Neutro Abs: 5 10*3/uL (ref 1.7–7.7)
Neutrophils Relative %: 60 %
Platelet Count: 164 10*3/uL (ref 150–400)
RBC: 3.56 MIL/uL — ABNORMAL LOW (ref 3.87–5.11)
RDW: 15.1 % (ref 11.5–15.5)
WBC Count: 8.4 10*3/uL (ref 4.0–10.5)
nRBC: 0 % (ref 0.0–0.2)

## 2023-05-28 LAB — CMP (CANCER CENTER ONLY)
ALT: 26 U/L (ref 0–44)
AST: 17 U/L (ref 15–41)
Albumin: 3.7 g/dL (ref 3.5–5.0)
Alkaline Phosphatase: 77 U/L (ref 38–126)
Anion gap: 6 (ref 5–15)
BUN: 19 mg/dL (ref 8–23)
CO2: 27 mmol/L (ref 22–32)
Calcium: 8.9 mg/dL (ref 8.9–10.3)
Chloride: 110 mmol/L (ref 98–111)
Creatinine: 0.72 mg/dL (ref 0.44–1.00)
GFR, Estimated: >60 mL/min (ref >60)
Glucose, Bld: 117 mg/dL — ABNORMAL HIGH (ref 70–99)
Potassium: 3.4 mmol/L — ABNORMAL LOW (ref 3.5–5.1)
Sodium: 143 mmol/L (ref 135–145)
Total Bilirubin: 0.5 mg/dL (ref 0.0–1.2)
Total Protein: 5.1 g/dL — ABNORMAL LOW (ref 6.5–8.1)

## 2023-05-28 MED ORDER — ACETAMINOPHEN 325 MG PO TABS
650.0000 mg | ORAL_TABLET | Freq: Once | ORAL | Status: AC
  Administered 2023-05-28: 650 mg via ORAL
  Filled 2023-05-28: qty 2

## 2023-05-28 MED ORDER — ZOLEDRONIC ACID 4 MG/100ML IV SOLN
4.0000 mg | Freq: Once | INTRAVENOUS | Status: AC
Start: 2023-05-28 — End: 2023-05-28
  Administered 2023-05-28: 4 mg via INTRAVENOUS
  Filled 2023-05-28: qty 100

## 2023-05-28 MED ORDER — SODIUM CHLORIDE 0.9 % IV SOLN: INTRAVENOUS | Status: DC

## 2023-05-28 MED ORDER — DIPHENHYDRAMINE HCL 25 MG PO CAPS
50.0000 mg | ORAL_CAPSULE | Freq: Once | ORAL | Status: AC
  Administered 2023-05-28: 50 mg via ORAL
  Filled 2023-05-28: qty 2

## 2023-05-28 MED ORDER — DARATUMUMAB-HYALURONIDASE-FIHJ 1800-30000 MG-UT/15ML ~~LOC~~ SOLN
1800.0000 mg | Freq: Once | SUBCUTANEOUS | Status: AC
  Administered 2023-05-28: 1800 mg via SUBCUTANEOUS
  Filled 2023-05-28: qty 15

## 2023-05-28 MED ORDER — DEXAMETHASONE 4 MG PO TABS
20.0000 mg | ORAL_TABLET | Freq: Once | ORAL | Status: AC
  Administered 2023-05-28: 20 mg via ORAL
  Filled 2023-05-28: qty 5

## 2023-05-28 NOTE — Progress Notes (Signed)
[facility] [phone]) [phone]   [phone]  OFFICE PROGRESS NOTE  [name], MD 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 Dorris Fetch on January 28, 2022.    CURRENT THERAPY:  1) Starting systemic therapy with Revlimid 21 days on 7 days off, Darzalex, and 20 mg of Decadron weekly.  First dose expected next week on 09/11/2022.  Status post 8 cycles.  2) Monthly Zometa injections for 6 to 12 months, followed by every 3 months thereon after once dental clearance is obtained.  INTERVAL HISTORY: [name] 83 y.o. female returns to the clinic today for follow-up visit. Discussed the use of AI scribe software for clinical note transcription with the patient, who gave verbal consent to proceed.  History of Present Illness   [name] is an 83 year old female with multiple myeloma who presents for cycle 10 of her treatment regimen. She is accompanied by her son, Tresa Endo.  She was diagnosed with multiple myeloma in May 2024 and has been undergoing treatment with subcutaneous daratumumab, Revlimid, and Decadron every four weeks. She has completed nine cycles and is starting cycle ten today. She has been doing well over the last four weeks with no significant side effects from the treatment.  She experiences intermittent back pain located just below the right shoulder blade. The pain is not constant and comes and goes. No nausea, vomiting, diarrhea, chest pain, or breathing issues are present.  Her blood pressure was noted to be high today, although she mentions having taken her medication earlier. She has a history  of low blood pressure, which she manages at home.      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 by echo 10/2021   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/2015   Carotid artery stenosis    < 50% stenosis bilaterally by doppler 07/2016   Coronary artery disease 2008   Coronary Ca score of 331 with minimal multivessel plaque < 25% stenosis by coronary CTA 8/23   Heart murmur    per pt   Hypercholesteremia    Hypertension    Obesity    Osteopenia    declines treatment   Pneumonia 1995   Pulmonary HTN (HCC)    mild to moderate by echo 7/23 with PASP   Shoulder pain    Due to arthritis   Vitamin D insufficiency     ALLERGIES:  is allergic to crestor [rosuvastatin calcium], lactose, lipitor [atorvastatin], pravastatin, simvastatin, tramadol, zetia [ezetimibe], codeine, penicillins, sulfa antibiotics, and vancomycin.  MEDICATIONS:  Current Outpatient Medications  Medication Sig Dispense Refill   acetaminophen (TYLENOL) 325 MG tablet Take 2 tablets (650 mg total) by mouth every 8 (eight) hours as needed.     acyclovir (ZOVIRAX) 200 MG capsule Take 1 capsule (200 mg total) by  mouth 2 (two) times daily. 30 capsule 3   allopurinol (ZYLOPRIM) 100 MG tablet Take 1 tablet by mouth twice daily 60 tablet 0   daratumumab-hyaluronidase-fihj (DARZALEX FASPRO) 1800-30000 MG-UT/15ML SOLN Inject 1,800 mg into the skin once.     dexamethasone (DECADRON) 4 MG tablet Please take 5 tablets (20 mg) weekly on the days of treatment 40 tablet 3   diphenoxylate-atropine (LOMOTIL) 2.5-0.025 MG tablet Take 1 tablet by mouth 4 (four) times daily as needed for diarrhea or loose stools. 30 tablet 0   lenalidomide (REVLIMID) 15 MG capsule Take 1 capsule (15 mg  total) by mouth every 21 ( twenty-one) days. [employer] Auth # [id number]     Date Obtained 05/19/2023 21 capsule 0   losartan (COZAAR) 25 MG tablet Take 1 tablet by mouth once daily 30 tablet 8   Multiple Vitamin (MULTIVITAMIN WITH MINERALS) TABS tablet Take 1 tablet by mouth daily.     potassium chloride SA (KLOR-CON M) 20 MEQ tablet Take 1 tablet (20 mEq total) by mouth daily. 7 tablet 0   No current facility-administered medications for this visit.    SURGICAL HISTORY:  Past Surgical History:  Procedure Laterality Date   AORTIC VALVE REPLACEMENT N/A 10/14/2013   Procedure: AORTIC VALVE REPLACEMENT (AVR);  Surgeon: Alleen Borne, MD;  Location: [facility] OR;  Service: Open Heart Surgery;  Laterality: N/A;   CARDIAC CATHETERIZATION     CATARACT EXTRACTION, BILATERAL     CHOLECYSTECTOMY  04/08/1988   ESOPHAGOGASTRODUODENOSCOPY (EGD) WITH PROPOFOL N/A 09/05/2022   Procedure: ESOPHAGOGASTRODUODENOSCOPY (EGD) WITH PROPOFOL;  Surgeon: Kathi Der, MD;  Location: [facility] ENDOSCOPY;  Service: Gastroenterology;  Laterality: N/A;   INTRAOPERATIVE TRANSESOPHAGEAL ECHOCARDIOGRAM N/A 10/14/2013   Procedure: INTRAOPERATIVE TRANSESOPHAGEAL ECHOCARDIOGRAM;  Surgeon: Alleen Borne, MD;  Location: [facility] OR;  Service: Open Heart Surgery;  Laterality: N/A;   IVC FILTER INSERTION N/A 09/06/2022   Procedure: IVC FILTER INSERTION;  Surgeon: Leonie Douglas, MD;  Location: [facility] INVASIVE CV LAB;  Service: Cardiovascular;  Laterality: N/A;   KYPHOPLASTY Bilateral 03/27/2022   Procedure: KYPHOPLASTY AND BIOPSY THORACIC ELEVEN;  Surgeon: Lisbeth Renshaw, MD;  Location: [facility] OR;  Service: Neurosurgery;  Laterality: Bilateral;   LEFT AND RIGHT HEART CATHETERIZATION WITH CORONARY ANGIOGRAM N/A 09/16/2013   Procedure: LEFT AND RIGHT HEART CATHETERIZATION WITH CORONARY ANGIOGRAM;  Surgeon: Quintella Reichert, MD;  Location: [facility] CATH LAB;  Service: Cardiovascular;  Laterality: N/A;   TONSILLECTOMY      REVIEW OF SYSTEMS:  A  comprehensive review of systems was negative except for: Musculoskeletal: positive for arthralgias   PHYSICAL EXAMINATION: General appearance: alert, cooperative, fatigued, and no distress Head: Normocephalic, without obvious abnormality, atraumatic Neck: no adenopathy, no JVD, supple, symmetrical, trachea midline, and thyroid not enlarged, symmetric, no tenderness/mass/nodules Lymph nodes: Cervical, supraclavicular, and axillary nodes normal. Resp: clear to auscultation bilaterally Back: symmetric, no curvature. ROM normal. No CVA tenderness. Cardio: regular rate and rhythm, S1, S2 normal, no murmur, click, rub or gallop GI: soft, non-tender; bowel sounds normal; no masses,  no organomegaly Extremities: extremities normal, atraumatic, no cyanosis or edema  ECOG PERFORMANCE STATUS: 1 - Symptomatic but completely ambulatory  Blood pressure (!) 170/51, pulse (!) 52, temperature (!) 97.2 F (36.2 C), temperature source Tympanic, resp. rate 16, weight 140 lb (63.5 kg), SpO2 100%.  LABORATORY DATA: Lab Results  Component Value Date   WBC 8.4 05/28/2023   HGB 11.7 (L) 05/28/2023   HCT 35.2 (L) 05/28/2023   MCV 98.9 05/28/2023   PLT 164  05/28/2023      Chemistry      Component Value Date/Time   NA 142 04/30/2023 1016   NA 145 (H) 02/27/2023 1140   K 3.7 04/30/2023 1016   CL 111 04/30/2023 1016   CO2 25 04/30/2023 1016   BUN 13 04/30/2023 1016   BUN 22 02/27/2023 1140   CREATININE 0.74 04/30/2023 1016   CREATININE 0.85 01/29/2016 0910      Component Value Date/Time   CALCIUM 8.5 (L) 04/30/2023 1016   ALKPHOS 84 04/30/2023 1016   AST 18 04/30/2023 1016   ALT 19 04/30/2023 1016   BILITOT 0.7 04/30/2023 1016       RADIOGRAPHIC STUDIES: No results found.  ASSESSMENT AND PLAN: This is a very pleasant 83 years old white female with  1) history of stage I (T1a, N0, M0) thymoma type AB diagnosed in October 2023 status post resection under the care of Dr. Dorris Fetch on January 28, 2022 with close resection margin and microscopic infiltration of the capsule. 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  The patient is undergoing systemic therapy with daratumumab, Revlimid 15 mg p.o. daily for 21 days every 4 weeks in addition to Decadron 20 mg weekly with the treatment.  Status post 9 cycles.  She has been tolerating her treatment fairly well.    Multiple Myeloma Diagnosed in May 2024. Currently on subcutaneous daratumumab, Revlimid, and Decadron. Completed nine cycles, starting cycle ten today. Reports intermittent back pain below the right shoulder blade. No significant symptoms such as nausea, vomiting, diarrhea, chest pain, or dyspnea. Blood work is adequate for treatment. Discussed repeating the myeloma panel a week before the next visit or on the same day and reviewing results at the following visit. - Administer cycle ten of subcutaneous daratumumab, Revlimid, and Decadron - Repeat myeloma panel one week before the next visit or on the same day - Review myeloma panel results at the following visit  Hypertension Elevated blood pressure noted. She reports adherence to medication. Advised to monitor blood pressure at home and consult with her family doctor if it remains elevated. - Monitor blood pressure at home - Consult with family doctor if blood pressure remains elevated  Follow-up - Schedule follow-up visit in four weeks.  For the bone disease, she will continue her treatment with Zometa but I will change the frequency to every 12 weeks.  The patient was advised to call immediately if she has any concerning symptoms in the interval. The patient voices understanding of current disease status and treatment options and is in agreement with the current care plan.  All questions were answered. The patient knows to call the clinic with any problems, questions or concerns. We can certainly see the patient much sooner if necessary.  The total  time spent in the appointment was 20 minutes.  Disclaimer: This note was dictated with voice recognition software. Similar sounding words can inadvertently be transcribed and may not be corrected upon review.

## 2023-05-28 NOTE — Patient Instructions (Signed)
[facility] - A DEPT OF [facility]  Discharge Instructions: Thank you for choosing [facility] to provide your oncology and hematology care.   If you have a lab appointment with the Cancer Center, please go directly to the Cancer Center and check in at the registration area.   Wear comfortable clothing and clothing appropriate for easy access to any Portacath or PICC line.   We strive to give you quality time with your provider. You may need to reschedule your appointment if you arrive late (15 or more minutes).  Arriving late affects you and other patients whose appointments are after yours.  Also, if you miss three or more appointments without notifying the office, you may be dismissed from the clinic at the provider's discretion.      For prescription refill requests, have your pharmacy contact our office and allow 72 hours for refills to be completed.    Today you received the following chemotherapy and/or immunotherapy agents: daratumumab-hyaluronidase-fihj (DARZALEX FASPRO       To help prevent nausea and vomiting after your treatment, we encourage you to take your nausea medication as directed.  BELOW ARE SYMPTOMS THAT SHOULD BE REPORTED IMMEDIATELY: *FEVER GREATER THAN 100.4 F (38 C) OR HIGHER *CHILLS OR SWEATING *NAUSEA AND VOMITING THAT IS NOT CONTROLLED WITH YOUR NAUSEA MEDICATION *UNUSUAL SHORTNESS OF BREATH *UNUSUAL BRUISING OR BLEEDING *URINARY PROBLEMS (pain or burning when urinating, or frequent urination) *BOWEL PROBLEMS (unusual diarrhea, constipation, pain near the anus) TENDERNESS IN MOUTH AND THROAT WITH OR WITHOUT PRESENCE OF ULCERS (sore throat, sores in mouth, or a toothache) UNUSUAL RASH, SWELLING OR PAIN  UNUSUAL VAGINAL DISCHARGE OR ITCHING   Items with * indicate a potential emergency and should be followed up as soon as possible or go to the Emergency Department if any problems should occur.  Please show the  CHEMOTHERAPY ALERT CARD or IMMUNOTHERAPY ALERT CARD at check-in to the Emergency Department and triage nurse.  Should you have questions after your visit or need to cancel or reschedule your appointment, please contact [facility] - A DEPT OF [facility]  Dept: [phone]  and follow the prompts.  Office hours are 8:00 a.m. to 4:30 p.m. Monday - Friday. Please note that voicemails left after 4:00 p.m. may not be returned until the following business day.  We are closed weekends and major holidays. You have access to a nurse at all times for urgent questions. Please call the main number to the clinic Dept: [phone] and follow the prompts.   For any non-urgent questions, you may also contact your provider using MyChart. We now offer e-Visits for anyone 63 and older to request care online for non-urgent symptoms. For details visit [email or web address].   Also download the MyChart app! Go to the app store, search "MyChart", open the app, select [facility], and log in with your MyChart username and password.

## 2023-06-05 ENCOUNTER — Other Ambulatory Visit: Payer: Self-pay | Admitting: Medical Oncology

## 2023-06-05 DIAGNOSIS — C9000 Multiple myeloma not having achieved remission: Secondary | ICD-10-CM

## 2023-06-05 MED ORDER — LENALIDOMIDE 15 MG PO CAPS: 15.0000 mg | ORAL_CAPSULE | ORAL | 0 refills | Status: DC

## 2023-06-10 ENCOUNTER — Other Ambulatory Visit: Payer: Self-pay | Admitting: Internal Medicine

## 2023-06-10 DIAGNOSIS — C9000 Multiple myeloma not having achieved remission: Secondary | ICD-10-CM

## 2023-06-16 NOTE — Progress Notes (Signed)
 [facility] OFFICE PROGRESS NOTE  [name], MD 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 Dorris Fetch on January 28, 2022.   CURRENT THERAPY:  1) Starting systemic therapy with Revlimid 21 days on 7 days off, Darzalex, and 20 mg of Decadron weekly.  First dose expected next week on 09/11/2022.  Status post 10 cycles.  2) Monthly Zometa injections for 6 to 12 months, followed by every 3 months. Dr. Arbutus Ped changed this to every 3 months at her last appointment. This was last given on 05/28/23  INTERVAL HISTORY: [name] 83 y.o. female returns to the clinic today for a follow-up visit accompanied by her son.  The patient was last seen 1 month ago by Dr. Arbutus Ped.  The patient is being seen for treatment for multiple myeloma.  She tolerated her last cycle of treatment well without any concerning adverse side effects. She does have fatigue. At her last appointment she was endorsing back pain near the shoulder blade. She continues to have intermittent pain on the right side. It mostly is at night when laying down. She has not taken anything for it. She denies falls or injuries. She does have innumerable lytic lesions throughout the skeleton.   She reports stable fatigue.  She denies any fever, chills, or night sweats (except one night due to too many covers and warmer temperatures). Her weight is stable. Her appetite is stable but it is not where it was prior to her diagnosis. She drinks protein supplemental drinks with Ensure.  She denies any chest pain, shortness of breath, cough, or hemoptysis.  Denies any signs and symptoms of infection  including sore throat, nasal congestion, or hemoptysis.  Denies any nausea or vomiting. She struggles with chronic diarrhea for which she sees GI. She is prescribed colestipol but does not always remember to take it. They just made an alarm on the phone to help her remember. Overall, she has not had as much diarrhea this week as she normally does. She denies any rashes or skin changes. She denies any abnormal bleeding except for easy extremity bruising. She is here today for evaluation and repeat blood work before undergoing her next cycle of treatment.    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 by echo 10/2021   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/2015   Carotid artery stenosis    < 50% stenosis bilaterally by doppler 07/2016   Coronary artery disease 2008   Coronary Ca score of 331 with minimal multivessel plaque < 25% stenosis by coronary CTA 8/23   Heart murmur    per pt   Hypercholesteremia    Hypertension    Obesity    Osteopenia    declines treatment   Pneumonia 1995   Pulmonary HTN (HCC)    mild to moderate by echo 7/23 with PASP  Shoulder pain    Due to arthritis   Vitamin D insufficiency     ALLERGIES:  is allergic to crestor [rosuvastatin calcium], lactose, lipitor [atorvastatin], pravastatin, simvastatin, tramadol, zetia [ezetimibe], codeine, penicillins, sulfa antibiotics, and vancomycin.  MEDICATIONS:  Current Outpatient Medications  Medication Sig Dispense Refill   acetaminophen (TYLENOL) 325 MG tablet Take 2 tablets (650 mg total) by mouth every 8 (eight) hours as needed.     acyclovir (ZOVIRAX) 200 MG capsule Take 1 capsule (200 mg total) by mouth 2 (two) times daily. 30 capsule 3   allopurinol  (ZYLOPRIM) 100 MG tablet Take 1 tablet by mouth twice daily 60 tablet 0   daratumumab-hyaluronidase-fihj (DARZALEX FASPRO) 1800-30000 MG-UT/15ML SOLN Inject 1,800 mg into the skin once.     dexamethasone (DECADRON) 4 MG tablet Please take 5 tablets (20 mg) weekly on the days of treatment 40 tablet 3   diphenoxylate-atropine (LOMOTIL) 2.5-0.025 MG tablet Take 1 tablet by mouth 4 (four) times daily as needed for diarrhea or loose stools. 30 tablet 0   lenalidomide (REVLIMID) 15 MG capsule TAKE 1 CAPSULE BY MOUTH EVERY DAY EVERY 21 DAYS 21 capsule 0   losartan (COZAAR) 25 MG tablet Take 1 tablet by mouth once daily 30 tablet 8   Multiple Vitamin (MULTIVITAMIN WITH MINERALS) TABS tablet Take 1 tablet by mouth daily.     potassium chloride SA (KLOR-CON M) 20 MEQ tablet Take 1 tablet (20 mEq total) by mouth daily. 7 tablet 0   No current facility-administered medications for this visit.    SURGICAL HISTORY:  Past Surgical History:  Procedure Laterality Date   AORTIC VALVE REPLACEMENT N/A 10/14/2013   Procedure: AORTIC VALVE REPLACEMENT (AVR);  Surgeon: Alleen Borne, MD;  Location: [facility] OR;  Service: Open Heart Surgery;  Laterality: N/A;   CARDIAC CATHETERIZATION     CATARACT EXTRACTION, BILATERAL     CHOLECYSTECTOMY  04/08/1988   ESOPHAGOGASTRODUODENOSCOPY (EGD) WITH PROPOFOL N/A 09/05/2022   Procedure: ESOPHAGOGASTRODUODENOSCOPY (EGD) WITH PROPOFOL;  Surgeon: Kathi Der, MD;  Location: [facility] ENDOSCOPY;  Service: Gastroenterology;  Laterality: N/A;   INTRAOPERATIVE TRANSESOPHAGEAL ECHOCARDIOGRAM N/A 10/14/2013   Procedure: INTRAOPERATIVE TRANSESOPHAGEAL ECHOCARDIOGRAM;  Surgeon: Alleen Borne, MD;  Location: [facility] OR;  Service: Open Heart Surgery;  Laterality: N/A;   IVC FILTER INSERTION N/A 09/06/2022   Procedure: IVC FILTER INSERTION;  Surgeon: Leonie Douglas, MD;  Location: [facility] INVASIVE CV LAB;  Service: Cardiovascular;  Laterality: N/A;   KYPHOPLASTY Bilateral 03/27/2022   Procedure:  KYPHOPLASTY AND BIOPSY THORACIC ELEVEN;  Surgeon: Lisbeth Renshaw, MD;  Location: [facility] OR;  Service: Neurosurgery;  Laterality: Bilateral;   LEFT AND RIGHT HEART CATHETERIZATION WITH CORONARY ANGIOGRAM N/A 09/16/2013   Procedure: LEFT AND RIGHT HEART CATHETERIZATION WITH CORONARY ANGIOGRAM;  Surgeon: Quintella Reichert, MD;  Location: [facility] CATH LAB;  Service: Cardiovascular;  Laterality: N/A;   TONSILLECTOMY      REVIEW OF SYSTEMS:   Constitutional: Positive for stable fatigue.  Negative for chills and fever.  HENT: Negative for mouth sores, nosebleeds, sore throat and trouble swallowing.   Eyes: Negative for eye problems and icterus.  Respiratory: Negative for cough, hemoptysis, shortness of breath and wheezing.   Cardiovascular: Negative for chest pain and leg swelling.  Gastrointestinal: Positive for intermittent diarrhea (improved this past week compared to baseline). Negative for abdominal pain, constipation, nausea and vomiting.  Genitourinary: Negative for bladder incontinence, difficulty urinating, dysuria, frequency and hematuria.   Musculoskeletal: Positive for intermittent scapular pain (right). Negative for back pain, gait  problem, neck pain and neck stiffness.  Skin: Negative for itching and rash.  Neurological: Negative for dizziness, extremity weakness, gait problem, headaches, light-headedness and seizures.  Hematological: Negative for adenopathy. Does not bruise/bleed easily.  Psychiatric/Behavioral: Negative for confusion, depression and sleep disturbance. The patient is not nervous/anxious.    PHYSICAL EXAMINATION:  There were no vitals taken for this visit.  ECOG PERFORMANCE STATUS: 1  Physical Exam  Constitutional: Oriented to person, place, and time and elderly appearing female well-nourished, and in no distress.  HENT:  Head: Normocephalic and atraumatic.  Mouth/Throat: Oropharynx is clear and moist. No oropharyngeal exudate.  Eyes: Conjunctivae are normal. Right eye  exhibits no discharge. Left eye exhibits no discharge. No scleral icterus.  Neck: Normal range of motion. Neck supple.  Cardiovascular: Normal rate, regular rhythm, normal heart sounds and intact distal pulses.   Pulmonary/Chest: Effort normal and breath sounds normal. No respiratory distress. No wheezes. No rales.  Abdominal: Soft. Bowel sounds are normal. Exhibits no distension and no mass. There is no tenderness.  Musculoskeletal: Normal range of motion. Exhibits no edema.  Lymphadenopathy:    No cervical adenopathy.  Neurological: Alert and oriented to person, place, and time. Exhibits muscle wasting. Ambulates with a walker.  Skin: Skin is warm and dry. No rash noted. Not diaphoretic. No erythema. No pallor.  Psychiatric: Mood, memory and judgment normal.  Vitals reviewed.  LABORATORY DATA: Lab Results  Component Value Date   WBC 8.4 05/28/2023   HGB 11.7 (L) 05/28/2023   HCT 35.2 (L) 05/28/2023   MCV 98.9 05/28/2023   PLT 164 05/28/2023      Chemistry      Component Value Date/Time   NA 143 05/28/2023 1106   NA 145 (H) 02/27/2023 1140   K 3.4 (L) 05/28/2023 1106   CL 110 05/28/2023 1106   CO2 27 05/28/2023 1106   BUN 19 05/28/2023 1106   BUN 22 02/27/2023 1140   CREATININE 0.72 05/28/2023 1106   CREATININE 0.85 01/29/2016 0910      Component Value Date/Time   CALCIUM 8.9 05/28/2023 1106   ALKPHOS 77 05/28/2023 1106   AST 17 05/28/2023 1106   ALT 26 05/28/2023 1106   BILITOT 0.5 05/28/2023 1106       RADIOGRAPHIC STUDIES:  No results found.  ASSESSMENT/PLAN:  This is a very pleasant 83 year old Caucasian female with  1) history of stage I (T1a, N0, M0) thymoma type AB diagnosed in October 2023 status post resection under the care of Dr. Dorris Fetch on January 28, 2022 with close resection margin and microscopic infiltration of the capsule. 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     She is currently undergoing treatment with  systemic therapy with daratumumab, Revlimid 15 mg p.o. daily for 21 days every 4 weeks in addition to Decadron 20 mg weekly with the treatment.  She is status post 10 cycles and tolerating this well.  Labs were reviewed. Recommend that she proceed with cycle #11 today as scheduled.   I will order her next set of myeloma labs 1 week prior to her next appointment so we can review the results at the next office visit.   For her history of thymic carcinoma, we will order CT scans annually. Her last scan was in November 2024. She is not due for her scan at this time.    She will continue to have Zometa every 12 weeks. This was last given on 05/28/23. She is not  due for this at this time.    We will see her back for follow-up visit in 4 weeks before undergoing her next cycle of treatment.   For the scapula pain, she has innumerable lytic bone lesions, some in the scapula/shoulder. We will check x-ray today to make sure nothing acute. If there is an area that is painful, I can always check to see if this is amenable to radiation. She denies traumas or injuries or swelling to this area. Advised to take tylenol if needed and can use topical salonpa patch.   For fatigue, we discussed activity as tolerated to prevent deconditioning and good nutrition.    The patient was advised to call immediately if she has any concerning symptoms in the interval. The patient voices understanding of current disease status and treatment options and is in agreement with the current care plan. All questions were answered. The patient knows to call the clinic with any problems, questions or concerns. We can certainly see the patient much sooner if necessary .      No orders of the defined types were placed in this encounter.    The total time spent in the appointment was 20-29 minutes  Cadence Minton L Breanna Mcdaniel, PA-C 06/16/23

## 2023-06-21 ENCOUNTER — Other Ambulatory Visit: Payer: Self-pay | Admitting: Physician Assistant

## 2023-06-21 DIAGNOSIS — C9000 Multiple myeloma not having achieved remission: Secondary | ICD-10-CM

## 2023-06-24 ENCOUNTER — Encounter: Payer: Self-pay | Admitting: Internal Medicine

## 2023-06-24 ENCOUNTER — Ambulatory Visit
Admission: RE | Admit: 2023-06-24 | Discharge: 2023-06-24 | Disposition: A | Discharge status: Home / Self Care | Payer: Medicare PPO | Source: Ambulatory Visit | Attending: Student

## 2023-06-24 ENCOUNTER — Other Ambulatory Visit: Payer: Self-pay | Admitting: Physician Assistant

## 2023-06-24 DIAGNOSIS — R195 Other fecal abnormalities: Secondary | ICD-10-CM | POA: Diagnosis not present

## 2023-06-24 DIAGNOSIS — C9000 Multiple myeloma not having achieved remission: Secondary | ICD-10-CM

## 2023-06-24 DIAGNOSIS — N83292 Other ovarian cyst, left side: Secondary | ICD-10-CM | POA: Diagnosis not present

## 2023-06-24 MED ORDER — IOPAMIDOL (ISOVUE-300) INJECTION 61%
100.0000 mL | Freq: Once | INTRAVENOUS | Status: AC | PRN
  Administered 2023-06-24: 100 mL via INTRAVENOUS

## 2023-06-25 ENCOUNTER — Other Ambulatory Visit: Payer: Medicare PPO

## 2023-06-25 ENCOUNTER — Ambulatory Visit: Payer: Medicare PPO

## 2023-06-25 ENCOUNTER — Inpatient Hospital Stay: Payer: Medicare PPO | Attending: Internal Medicine

## 2023-06-25 ENCOUNTER — Ambulatory Visit (HOSPITAL_COMMUNITY)
Admission: RE | Admit: 2023-06-25 | Discharge: 2023-06-25 | Disposition: A | Discharge status: Home / Self Care | Source: Ambulatory Visit | Attending: Physician Assistant | Admitting: Physician Assistant

## 2023-06-25 ENCOUNTER — Ambulatory Visit: Payer: Medicare PPO | Admitting: Internal Medicine

## 2023-06-25 ENCOUNTER — Inpatient Hospital Stay: Payer: Medicare PPO | Admitting: Physician Assistant

## 2023-06-25 ENCOUNTER — Inpatient Hospital Stay: Payer: Medicare PPO

## 2023-06-25 ENCOUNTER — Encounter: Payer: Self-pay | Admitting: Internal Medicine

## 2023-06-25 VITALS — BP 147/56 | HR 54 | Temp 97.3°F | Resp 17 | Wt 138.9 lb

## 2023-06-25 DIAGNOSIS — Z85238 Personal history of other malignant neoplasm of thymus: Secondary | ICD-10-CM | POA: Insufficient documentation

## 2023-06-25 DIAGNOSIS — Z5112 Encounter for antineoplastic immunotherapy: Secondary | ICD-10-CM | POA: Insufficient documentation

## 2023-06-25 DIAGNOSIS — Z79899 Other long term (current) drug therapy: Secondary | ICD-10-CM | POA: Insufficient documentation

## 2023-06-25 DIAGNOSIS — Z79624 Long term (current) use of inhibitors of nucleotide synthesis: Secondary | ICD-10-CM | POA: Insufficient documentation

## 2023-06-25 DIAGNOSIS — C9000 Multiple myeloma not having achieved remission: Secondary | ICD-10-CM | POA: Insufficient documentation

## 2023-06-25 DIAGNOSIS — M19011 Primary osteoarthritis, right shoulder: Secondary | ICD-10-CM | POA: Diagnosis not present

## 2023-06-25 DIAGNOSIS — Z7961 Long term (current) use of immunomodulator: Secondary | ICD-10-CM | POA: Insufficient documentation

## 2023-06-25 DIAGNOSIS — M25511 Pain in right shoulder: Secondary | ICD-10-CM | POA: Insufficient documentation

## 2023-06-25 DIAGNOSIS — M25519 Pain in unspecified shoulder: Secondary | ICD-10-CM | POA: Insufficient documentation

## 2023-06-25 LAB — CMP (CANCER CENTER ONLY)
ALT: 23 U/L (ref 0–44)
AST: 19 U/L (ref 15–41)
Albumin: 3.8 g/dL (ref 3.5–5.0)
Alkaline Phosphatase: 78 U/L (ref 38–126)
Anion gap: 6 (ref 5–15)
BUN: 13 mg/dL (ref 8–23)
CO2: 27 mmol/L (ref 22–32)
Calcium: 8.8 mg/dL — ABNORMAL LOW (ref 8.9–10.3)
Chloride: 110 mmol/L (ref 98–111)
Creatinine: 0.84 mg/dL (ref 0.44–1.00)
GFR, Estimated: >60 mL/min (ref >60)
Glucose, Bld: 124 mg/dL — ABNORMAL HIGH (ref 70–99)
Potassium: 3.4 mmol/L — ABNORMAL LOW (ref 3.5–5.1)
Sodium: 143 mmol/L (ref 135–145)
Total Bilirubin: 0.5 mg/dL (ref 0.0–1.2)
Total Protein: 5.4 g/dL — ABNORMAL LOW (ref 6.5–8.1)

## 2023-06-25 LAB — CBC WITH DIFFERENTIAL (CANCER CENTER ONLY)
Abs Immature Granulocytes: 0.02 10*3/uL (ref 0.00–0.07)
Basophils Absolute: 0.1 10*3/uL (ref 0.0–0.1)
Basophils Relative: 1 %
Eosinophils Absolute: 0.1 10*3/uL (ref 0.0–0.5)
Eosinophils Relative: 1 %
HCT: 35.1 % — ABNORMAL LOW (ref 36.0–46.0)
Hemoglobin: 11.7 g/dL — ABNORMAL LOW (ref 12.0–15.0)
Immature Granulocytes: 0 %
Lymphocytes Relative: 22 %
Lymphs Abs: 1.8 10*3/uL (ref 0.7–4.0)
MCH: 32.7 pg (ref 26.0–34.0)
MCHC: 33.3 g/dL (ref 30.0–36.0)
MCV: 98 fL (ref 80.0–100.0)
Monocytes Absolute: 0.8 10*3/uL (ref 0.1–1.0)
Monocytes Relative: 10 %
Neutro Abs: 5.4 10*3/uL (ref 1.7–7.7)
Neutrophils Relative %: 66 %
Platelet Count: 198 10*3/uL (ref 150–400)
RBC: 3.58 MIL/uL — ABNORMAL LOW (ref 3.87–5.11)
RDW: 14.9 % (ref 11.5–15.5)
WBC Count: 8.1 10*3/uL (ref 4.0–10.5)
nRBC: 0 % (ref 0.0–0.2)

## 2023-06-25 LAB — LACTATE DEHYDROGENASE: LDH: 158 U/L (ref 98–192)

## 2023-06-25 MED ORDER — DARATUMUMAB-HYALURONIDASE-FIHJ 1800-30000 MG-UT/15ML ~~LOC~~ SOLN
1800.0000 mg | Freq: Once | SUBCUTANEOUS | Status: AC
  Administered 2023-06-25: 1800 mg via SUBCUTANEOUS
  Filled 2023-06-25: qty 15

## 2023-06-25 MED ORDER — ACETAMINOPHEN 325 MG PO TABS
650.0000 mg | ORAL_TABLET | Freq: Once | ORAL | Status: AC
  Administered 2023-06-25: 650 mg via ORAL
  Filled 2023-06-25: qty 2

## 2023-06-25 MED ORDER — DIPHENHYDRAMINE HCL 25 MG PO CAPS
50.0000 mg | ORAL_CAPSULE | Freq: Once | ORAL | Status: AC
  Administered 2023-06-25: 50 mg via ORAL
  Filled 2023-06-25: qty 2

## 2023-06-25 MED ORDER — DEXAMETHASONE 4 MG PO TABS
20.0000 mg | ORAL_TABLET | Freq: Once | ORAL | Status: AC
  Administered 2023-06-25: 20 mg via ORAL
  Filled 2023-06-25: qty 5

## 2023-06-25 NOTE — Patient Instructions (Signed)

## 2023-06-26 ENCOUNTER — Telehealth: Payer: Self-pay | Admitting: Internal Medicine

## 2023-06-26 NOTE — Telephone Encounter (Signed)
 Left the patient a voicemail with the added lab appointment details.

## 2023-06-28 ENCOUNTER — Other Ambulatory Visit: Payer: Self-pay | Admitting: Internal Medicine

## 2023-06-28 DIAGNOSIS — C9000 Multiple myeloma not having achieved remission: Secondary | ICD-10-CM

## 2023-06-29 ENCOUNTER — Encounter: Payer: Self-pay | Admitting: Internal Medicine

## 2023-07-02 ENCOUNTER — Encounter: Payer: Self-pay | Admitting: Medical Oncology

## 2023-07-02 ENCOUNTER — Other Ambulatory Visit: Payer: Self-pay | Admitting: Medical Oncology

## 2023-07-02 DIAGNOSIS — C9000 Multiple myeloma not having achieved remission: Secondary | ICD-10-CM

## 2023-07-02 MED ORDER — OMEPRAZOLE 20 MG PO CPDR: 20.0000 mg | DELAYED_RELEASE_CAPSULE | Freq: Every day | ORAL | 0 refills | Status: DC

## 2023-07-02 MED ORDER — ACYCLOVIR 200 MG PO CAPS
200.0000 mg | ORAL_CAPSULE | Freq: Two times a day (BID) | ORAL | 0 refills | Status: AC
Start: 2023-07-02 — End: ?

## 2023-07-07 ENCOUNTER — Other Ambulatory Visit: Payer: Self-pay | Admitting: Internal Medicine

## 2023-07-07 ENCOUNTER — Telehealth: Payer: Self-pay | Admitting: Medical Oncology

## 2023-07-07 ENCOUNTER — Telehealth: Payer: Self-pay

## 2023-07-07 DIAGNOSIS — C9000 Multiple myeloma not having achieved remission: Secondary | ICD-10-CM

## 2023-07-07 NOTE — Telephone Encounter (Signed)
 [name] asking for results of shoulder /scapula xray. I told him there is not yet a  report .

## 2023-07-07 NOTE — Telephone Encounter (Signed)
 Pt's son, Ferne Reus, advised of results and recommendation.  Note also sent via My Chart to pt

## 2023-07-07 NOTE — Telephone Encounter (Signed)
-----   Message from Cassandra L Heilingoetter sent at 07/07/2023  3:56 PM EDT ----- Can you let them know that while she has some arthritis in the shoulder joint, it does appear she has a multiple myeloma lesion in the scapula. If it is bothering her, I would recommend referral to radiation to see if she can get palliative radiation to this area. Can you call her son and let him know? If they are interested in this, let me know and I will place referral. ----- Message ----- From: Interface, Rad Results In Sent: 07/07/2023   2:15 PM EDT To: Cassandra L Heilingoetter, PA-C

## 2023-07-08 ENCOUNTER — Other Ambulatory Visit: Payer: Self-pay | Admitting: Medical Oncology

## 2023-07-08 DIAGNOSIS — C9000 Multiple myeloma not having achieved remission: Secondary | ICD-10-CM

## 2023-07-08 MED ORDER — LENALIDOMIDE 15 MG PO CAPS: 15.0000 mg | ORAL_CAPSULE | Freq: Every day | ORAL | 0 refills | Status: DC

## 2023-07-16 ENCOUNTER — Inpatient Hospital Stay: Attending: Internal Medicine

## 2023-07-16 DIAGNOSIS — Z5112 Encounter for antineoplastic immunotherapy: Secondary | ICD-10-CM | POA: Insufficient documentation

## 2023-07-16 DIAGNOSIS — Z7983 Long term (current) use of bisphosphonates: Secondary | ICD-10-CM | POA: Diagnosis not present

## 2023-07-16 DIAGNOSIS — Z79899 Other long term (current) drug therapy: Secondary | ICD-10-CM | POA: Insufficient documentation

## 2023-07-16 DIAGNOSIS — Z79624 Long term (current) use of inhibitors of nucleotide synthesis: Secondary | ICD-10-CM | POA: Diagnosis not present

## 2023-07-16 DIAGNOSIS — C9000 Multiple myeloma not having achieved remission: Secondary | ICD-10-CM | POA: Diagnosis not present

## 2023-07-16 LAB — CMP (CANCER CENTER ONLY)
ALT: 29 U/L (ref 0–44)
AST: 22 U/L (ref 15–41)
Albumin: 3.8 g/dL (ref 3.5–5.0)
Alkaline Phosphatase: 79 U/L (ref 38–126)
Anion gap: 6 (ref 5–15)
BUN: 14 mg/dL (ref 8–23)
CO2: 30 mmol/L (ref 22–32)
Calcium: 8.7 mg/dL — ABNORMAL LOW (ref 8.9–10.3)
Chloride: 108 mmol/L (ref 98–111)
Creatinine: 0.79 mg/dL (ref 0.44–1.00)
GFR, Estimated: >60 mL/min (ref >60)
Glucose, Bld: 92 mg/dL (ref 70–99)
Potassium: 3.2 mmol/L — ABNORMAL LOW (ref 3.5–5.1)
Sodium: 144 mmol/L (ref 135–145)
Total Bilirubin: 0.5 mg/dL (ref 0.0–1.2)
Total Protein: 5.5 g/dL — ABNORMAL LOW (ref 6.5–8.1)

## 2023-07-16 LAB — CBC WITH DIFFERENTIAL (CANCER CENTER ONLY)
Abs Immature Granulocytes: 0.04 10*3/uL (ref 0.00–0.07)
Basophils Absolute: 0.2 10*3/uL — ABNORMAL HIGH (ref 0.0–0.1)
Basophils Relative: 2 %
Eosinophils Absolute: 0.4 10*3/uL (ref 0.0–0.5)
Eosinophils Relative: 5 %
HCT: 34.3 % — ABNORMAL LOW (ref 36.0–46.0)
Hemoglobin: 11.6 g/dL — ABNORMAL LOW (ref 12.0–15.0)
Immature Granulocytes: 1 %
Lymphocytes Relative: 26 %
Lymphs Abs: 2 10*3/uL (ref 0.7–4.0)
MCH: 32.6 pg (ref 26.0–34.0)
MCHC: 33.8 g/dL (ref 30.0–36.0)
MCV: 96.3 fL (ref 80.0–100.0)
Monocytes Absolute: 1 10*3/uL (ref 0.1–1.0)
Monocytes Relative: 13 %
Neutro Abs: 4.1 10*3/uL (ref 1.7–7.7)
Neutrophils Relative %: 53 %
Platelet Count: 169 10*3/uL (ref 150–400)
RBC: 3.56 MIL/uL — ABNORMAL LOW (ref 3.87–5.11)
RDW: 14.6 % (ref 11.5–15.5)
WBC Count: 7.5 10*3/uL (ref 4.0–10.5)
nRBC: 0 % (ref 0.0–0.2)

## 2023-07-17 ENCOUNTER — Telehealth: Payer: Self-pay

## 2023-07-17 ENCOUNTER — Other Ambulatory Visit: Payer: Self-pay | Admitting: Physician Assistant

## 2023-07-17 DIAGNOSIS — E876 Hypokalemia: Secondary | ICD-10-CM

## 2023-07-17 LAB — KAPPA/LAMBDA LIGHT CHAINS
Kappa free light chain: 6 mg/L (ref 3.3–19.4)
Kappa, lambda light chain ratio: 0.91 (ref 0.26–1.65)
Lambda free light chains: 6.6 mg/L (ref 5.7–26.3)

## 2023-07-17 LAB — BETA 2 MICROGLOBULIN, SERUM: Beta-2 Microglobulin: 2.2 mg/L (ref 0.6–2.4)

## 2023-07-17 MED ORDER — POTASSIUM CHLORIDE CRYS ER 20 MEQ PO TBCR: 20.0000 meq | EXTENDED_RELEASE_TABLET | Freq: Every day | ORAL | 0 refills | Status: AC

## 2023-07-17 NOTE — Telephone Encounter (Signed)
 Tried to reach patient in regards to lab results.  Per Cassie, PA- patients potassium is low.  Sent potassium to pharmacy to take once daily for 7 days. LVM for patient.   Spoke with patients son in regards to above. He verbalized understanding.

## 2023-07-18 LAB — MULTIPLE MYELOMA PANEL, SERUM
Albumin SerPl Elph-Mcnc: 3.6 g/dL (ref 2.9–4.4)
Albumin/Glob SerPl: 2.5 — ABNORMAL HIGH (ref 0.7–1.7)
Alpha 1: 0.1 g/dL (ref 0.0–0.4)
Alpha2 Glob SerPl Elph-Mcnc: 0.5 g/dL (ref 0.4–1.0)
B-Globulin SerPl Elph-Mcnc: 0.6 g/dL — ABNORMAL LOW (ref 0.7–1.3)
Gamma Glob SerPl Elph-Mcnc: 0.2 g/dL — ABNORMAL LOW (ref 0.4–1.8)
Globulin, Total: 1.5 g/dL — ABNORMAL LOW (ref 2.2–3.9)
IgA: 36 mg/dL — ABNORMAL LOW (ref 64–422)
IgG (Immunoglobin G), Serum: 274 mg/dL — ABNORMAL LOW (ref 586–1602)
IgM (Immunoglobulin M), Srm: 31 mg/dL (ref 26–217)
M Protein SerPl Elph-Mcnc: 0.1 g/dL — ABNORMAL HIGH
Total Protein ELP: 5.1 g/dL — ABNORMAL LOW (ref 6.0–8.5)

## 2023-07-20 ENCOUNTER — Other Ambulatory Visit: Payer: Self-pay | Admitting: Physician Assistant

## 2023-07-20 DIAGNOSIS — C9000 Multiple myeloma not having achieved remission: Secondary | ICD-10-CM

## 2023-07-23 ENCOUNTER — Inpatient Hospital Stay: Payer: Medicare PPO

## 2023-07-23 ENCOUNTER — Other Ambulatory Visit: Payer: Self-pay | Admitting: Medical Oncology

## 2023-07-23 ENCOUNTER — Inpatient Hospital Stay: Payer: Medicare PPO | Admitting: Internal Medicine

## 2023-07-23 VITALS — BP 171/39 | HR 53 | Temp 98.1°F | Resp 16 | Ht 61.5 in | Wt 137.3 lb

## 2023-07-23 DIAGNOSIS — C9000 Multiple myeloma not having achieved remission: Secondary | ICD-10-CM

## 2023-07-23 DIAGNOSIS — Z79624 Long term (current) use of inhibitors of nucleotide synthesis: Secondary | ICD-10-CM | POA: Diagnosis not present

## 2023-07-23 DIAGNOSIS — Z79899 Other long term (current) drug therapy: Secondary | ICD-10-CM | POA: Diagnosis not present

## 2023-07-23 DIAGNOSIS — Z5112 Encounter for antineoplastic immunotherapy: Secondary | ICD-10-CM | POA: Diagnosis not present

## 2023-07-23 DIAGNOSIS — Z7983 Long term (current) use of bisphosphonates: Secondary | ICD-10-CM | POA: Diagnosis not present

## 2023-07-23 LAB — CMP (CANCER CENTER ONLY)
ALT: 33 U/L (ref 0–44)
AST: 22 U/L (ref 15–41)
Albumin: 3.8 g/dL (ref 3.5–5.0)
Alkaline Phosphatase: 74 U/L (ref 38–126)
Anion gap: 5 (ref 5–15)
BUN: 20 mg/dL (ref 8–23)
CO2: 27 mmol/L (ref 22–32)
Calcium: 8.8 mg/dL — ABNORMAL LOW (ref 8.9–10.3)
Chloride: 112 mmol/L — ABNORMAL HIGH (ref 98–111)
Creatinine: 0.87 mg/dL (ref 0.44–1.00)
GFR, Estimated: >60 mL/min (ref >60)
Glucose, Bld: 122 mg/dL — ABNORMAL HIGH (ref 70–99)
Potassium: 3.5 mmol/L (ref 3.5–5.1)
Sodium: 144 mmol/L (ref 135–145)
Total Bilirubin: 0.5 mg/dL (ref 0.0–1.2)
Total Protein: 5.3 g/dL — ABNORMAL LOW (ref 6.5–8.1)

## 2023-07-23 LAB — CBC WITH DIFFERENTIAL (CANCER CENTER ONLY)
Abs Immature Granulocytes: 0.03 10*3/uL (ref 0.00–0.07)
Basophils Absolute: 0.2 10*3/uL — ABNORMAL HIGH (ref 0.0–0.1)
Basophils Relative: 2 %
Eosinophils Absolute: 0.1 10*3/uL (ref 0.0–0.5)
Eosinophils Relative: 1 %
HCT: 34.2 % — ABNORMAL LOW (ref 36.0–46.0)
Hemoglobin: 11.5 g/dL — ABNORMAL LOW (ref 12.0–15.0)
Immature Granulocytes: 0 %
Lymphocytes Relative: 24 %
Lymphs Abs: 2.3 10*3/uL (ref 0.7–4.0)
MCH: 32.9 pg (ref 26.0–34.0)
MCHC: 33.6 g/dL (ref 30.0–36.0)
MCV: 97.7 fL (ref 80.0–100.0)
Monocytes Absolute: 0.9 10*3/uL (ref 0.1–1.0)
Monocytes Relative: 9 %
Neutro Abs: 6.1 10*3/uL (ref 1.7–7.7)
Neutrophils Relative %: 64 %
Platelet Count: 188 10*3/uL (ref 150–400)
RBC: 3.5 MIL/uL — ABNORMAL LOW (ref 3.87–5.11)
RDW: 14.7 % (ref 11.5–15.5)
WBC Count: 9.5 10*3/uL (ref 4.0–10.5)
nRBC: 0 % (ref 0.0–0.2)

## 2023-07-23 MED ORDER — ACETAMINOPHEN 325 MG PO TABS
650.0000 mg | ORAL_TABLET | Freq: Once | ORAL | Status: AC
  Administered 2023-07-23: 650 mg via ORAL
  Filled 2023-07-23: qty 2

## 2023-07-23 MED ORDER — DARATUMUMAB-HYALURONIDASE-FIHJ 1800-30000 MG-UT/15ML ~~LOC~~ SOLN
1800.0000 mg | Freq: Once | SUBCUTANEOUS | Status: AC
  Administered 2023-07-23: 1800 mg via SUBCUTANEOUS
  Filled 2023-07-23: qty 15

## 2023-07-23 MED ORDER — DIPHENHYDRAMINE HCL 25 MG PO CAPS
50.0000 mg | ORAL_CAPSULE | Freq: Once | ORAL | Status: AC
  Administered 2023-07-23: 50 mg via ORAL
  Filled 2023-07-23: qty 2

## 2023-07-23 MED ORDER — DEXAMETHASONE 4 MG PO TABS
20.0000 mg | ORAL_TABLET | Freq: Once | ORAL | Status: AC
  Administered 2023-07-23: 20 mg via ORAL
  Filled 2023-07-23: qty 5

## 2023-07-23 NOTE — Progress Notes (Signed)
 [facility] [phone]) [phone]   [phone]  OFFICE PROGRESS NOTE  [name], MD 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 Dorris Fetch on January 28, 2022.    CURRENT THERAPY:  1) Starting systemic therapy with Revlimid 21 days on 7 days off, Darzalex, and 20 mg of Decadron weekly.  First dose expected next week on 09/11/2022.  Status post 11 cycles.  2) Monthly Zometa injections for 6 to 12 months, followed by every 3 months thereon after once dental clearance is obtained.  INTERVAL HISTORY: [name] 83 y.o. female returns to the clinic today for follow-up visit accompanied by her son. Discussed the use of AI scribe software for clinical note transcription with the patient, who gave verbal consent to proceed.  History of Present Illness   [name] is an 83 year old female with multiple myeloma who presents for evaluation before starting cycle number twelve of her treatment.  She is currently undergoing treatment for multiple myeloma with daratumumab, Revlimid, and Decadron, having completed 11 cycles. She is also receiving monthly Zometa for bone disease. Her energy level is not at 'a hundred percent' but she is still able to perform her daily activities with more rest in between.  She experiences persistent pain in her right lower shoulder blade area. The pain is described as steady and is less bothersome when she is lying on the couch. However, it becomes more painful when she lies on her side in bed, which is her natural sleeping position. An x-ray was previously conducted, revealing potential  lesions that may be related to her multiple myeloma, as well as other degenerative changes in the same area.  No new complaints or significant changes in her condition over the past several weeks.       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 by echo 10/2021   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/2015   Carotid artery stenosis    < 50% stenosis bilaterally by doppler 07/2016   Coronary artery disease 2008   Coronary Ca score of 331 with minimal multivessel plaque < 25% stenosis by coronary CTA 8/23   Heart murmur    per pt   Hypercholesteremia    Hypertension    Obesity    Osteopenia    declines treatment   Pneumonia 1995   Pulmonary HTN (HCC)    mild to moderate by echo 7/23 with PASP   Shoulder pain    Due to arthritis   Vitamin D insufficiency     ALLERGIES:  is allergic to crestor [rosuvastatin calcium], lactose, lipitor [atorvastatin], pravastatin, simvastatin, tramadol, zetia [ezetimibe], codeine, penicillins, sulfa antibiotics, and vancomycin.  MEDICATIONS:  Current Outpatient Medications  Medication Sig Dispense Refill   acetaminophen (TYLENOL) 325 MG tablet Take 2 tablets (650 mg total)  by mouth every 8 (eight) hours as needed.     acyclovir (ZOVIRAX) 200 MG capsule Take 1 capsule (200 mg total) by mouth 2 (two) times daily. 180 capsule 0   allopurinol (ZYLOPRIM) 100 MG tablet Take 1 tablet by mouth twice daily 60 tablet 0   colestipol (COLESTID) 1 g tablet Take 1 g by mouth 2 (two) times daily.     daratumumab-hyaluronidase-fihj (DARZALEX FASPRO) 1800-30000 MG-UT/15ML SOLN Inject 1,800 mg into the skin once.     dexamethasone (DECADRON) 4 MG tablet Please take 5 tablets (20 mg) weekly on the days of  treatment 40 tablet 3   diphenoxylate-atropine (LOMOTIL) 2.5-0.025 MG tablet Take 1 tablet by mouth 4 (four) times daily as needed for diarrhea or loose stools. (Patient not taking: Reported on 06/25/2023) 30 tablet 0   lenalidomide (REVLIMID) 15 MG capsule Take 1 capsule (15 mg total) by mouth daily. [employer] Auth # [id number]     Date Obtained 07/08/2023. Adult female not of childbearing potential 21 capsule 0   losartan (COZAAR) 25 MG tablet Take 1 tablet by mouth once daily 30 tablet 8   Multiple Vitamin (MULTIVITAMIN WITH MINERALS) TABS tablet Take 1 tablet by mouth daily.     omeprazole (PRILOSEC) 20 MG capsule Take 1 capsule (20 mg total) by mouth daily. 90 capsule 0   potassium chloride SA (KLOR-CON M) 20 MEQ tablet Take 1 tablet (20 mEq total) by mouth daily. 7 tablet 0   No current facility-administered medications for this visit.    SURGICAL HISTORY:  Past Surgical History:  Procedure Laterality Date   AORTIC VALVE REPLACEMENT N/A 10/14/2013   Procedure: AORTIC VALVE REPLACEMENT (AVR);  Surgeon: Alleen Borne, MD;  Location: [facility] OR;  Service: Open Heart Surgery;  Laterality: N/A;   CARDIAC CATHETERIZATION     CATARACT EXTRACTION, BILATERAL     CHOLECYSTECTOMY  04/08/1988   ESOPHAGOGASTRODUODENOSCOPY (EGD) WITH PROPOFOL N/A 09/05/2022   Procedure: ESOPHAGOGASTRODUODENOSCOPY (EGD) WITH PROPOFOL;  Surgeon: Kathi Der, MD;  Location: [facility] ENDOSCOPY;  Service: Gastroenterology;  Laterality: N/A;   INTRAOPERATIVE TRANSESOPHAGEAL ECHOCARDIOGRAM N/A 10/14/2013   Procedure: INTRAOPERATIVE TRANSESOPHAGEAL ECHOCARDIOGRAM;  Surgeon: Alleen Borne, MD;  Location: [facility] OR;  Service: Open Heart Surgery;  Laterality: N/A;   IVC FILTER INSERTION N/A 09/06/2022   Procedure: IVC FILTER INSERTION;  Surgeon: Leonie Douglas, MD;  Location: [facility] INVASIVE CV LAB;  Service: Cardiovascular;  Laterality: N/A;   KYPHOPLASTY Bilateral 03/27/2022   Procedure: KYPHOPLASTY AND BIOPSY THORACIC ELEVEN;  Surgeon:  Lisbeth Renshaw, MD;  Location: [facility] OR;  Service: Neurosurgery;  Laterality: Bilateral;   LEFT AND RIGHT HEART CATHETERIZATION WITH CORONARY ANGIOGRAM N/A 09/16/2013   Procedure: LEFT AND RIGHT HEART CATHETERIZATION WITH CORONARY ANGIOGRAM;  Surgeon: Quintella Reichert, MD;  Location: [facility] CATH LAB;  Service: Cardiovascular;  Laterality: N/A;   TONSILLECTOMY      REVIEW OF SYSTEMS:  Constitutional: positive for fatigue Eyes: negative Ears, nose, mouth, throat, and face: negative Respiratory: negative Cardiovascular: negative Gastrointestinal: negative Genitourinary:negative Integument/breast: negative Hematologic/lymphatic: negative Musculoskeletal:positive for bone pain Neurological: negative Behavioral/Psych: negative Endocrine: negative Allergic/Immunologic: negative   PHYSICAL EXAMINATION: General appearance: alert, cooperative, fatigued, and no distress Head: Normocephalic, without obvious abnormality, atraumatic Neck: no adenopathy, no JVD, supple, symmetrical, trachea midline, and thyroid not enlarged, symmetric, no tenderness/mass/nodules Lymph nodes: Cervical, supraclavicular, and axillary nodes normal. Resp: clear to auscultation bilaterally Back: symmetric, no curvature. ROM normal. No CVA tenderness. Cardio: regular rate and rhythm, S1, S2 normal, no murmur, click, rub or  gallop GI: soft, non-tender; bowel sounds normal; no masses,  no organomegaly Extremities: extremities normal, atraumatic, no cyanosis or edema Neurologic: Alert and oriented X 3, normal strength and tone. Normal symmetric reflexes. Normal coordination and gait  ECOG PERFORMANCE STATUS: 1 - Symptomatic but completely ambulatory  Blood pressure (!) 171/39, pulse (!) 53, temperature 98.1 F (36.7 C), temperature source Temporal, resp. rate 16, height 5' 1.5" (1.562 m), weight 137 lb 4.8 oz (62.3 kg), SpO2 99%.  LABORATORY DATA: Lab Results  Component Value Date   WBC 9.5 07/23/2023   HGB 11.5 (L)  07/23/2023   HCT 34.2 (L) 07/23/2023   MCV 97.7 07/23/2023   PLT 188 07/23/2023      Chemistry      Component Value Date/Time   NA 144 07/16/2023 1413   NA 145 (H) 02/27/2023 1140   K 3.2 (L) 07/16/2023 1413   CL 108 07/16/2023 1413   CO2 30 07/16/2023 1413   BUN 14 07/16/2023 1413   BUN 22 02/27/2023 1140   CREATININE 0.79 07/16/2023 1413   CREATININE 0.85 01/29/2016 0910      Component Value Date/Time   CALCIUM 8.7 (L) 07/16/2023 1413   ALKPHOS 79 07/16/2023 1413   AST 22 07/16/2023 1413   ALT 29 07/16/2023 1413   BILITOT 0.5 07/16/2023 1413       RADIOGRAPHIC STUDIES: CT ENTERO ABD/PELVIS W CONTAST Result Date: 07/09/2023 CLINICAL DATA:  Elevated fecal calprotectin EXAM: CT ABDOMEN AND PELVIS WITH CONTRAST (ENTEROGRAPHY) TECHNIQUE: Multidetector CT of the abdomen and pelvis during bolus administration of intravenous contrast. Negative oral contrast was given. RADIATION DOSE REDUCTION: This exam was performed according to the departmental dose-optimization program which includes automated exposure control, adjustment of the mA and/or kV according to patient size and/or use of iterative reconstruction technique. CONTRAST:  ISOVUE-300 IOPAMIDOL (ISOVUE-300) INJECTION 61% COMPARISON:  PET-CT dated 08/22/2022 FINDINGS: Lower chest:  Lung bases are clear. Hepatobiliary: Liver is within normal limits. Status post cholecystectomy. No intrahepatic or extrahepatic ductal dilatation. Pancreas: Within normal limits. Spleen: Within normal limits. Adrenals/Urinary Tract: Adrenal glands within normal limits. Subcentimeter bilateral renal cysts, benign (Bosniak I). No hydronephrosis. Bladder is within normal limits. Stomach/Bowel: Stomach is within normal limits. No evidence of bowel obstruction. No small bowel wall thickening or mass is evident on CT. Normal appendix (series 2/image 47). No colonic wall thickening or inflammatory changes. Vascular/Lymphatic: No evidence of abdominal aortic  aneurysm. Atherosclerotic calcifications of the abdominal aorta and branch vessels, although vessels remain patent. IVC filter. No suspicious abdominopelvic lymphadenopathy. Reproductive: Uterus and right ovary is within normal limits. 5.6 x 3.6 cm simple left adnexal cyst (series 2/image 64), without enhancement or solid component, non FDG avid on prior PET. Other: No abdominopelvic ascites. Musculoskeletal: Degenerative changes of the visualized thoracolumbar spine. Prior vertebral augmentation at T11. Grade 1 anterolisthesis at L5-S1. IMPRESSION: Negative CT enterography. 5.6 cm simple left adnexal cyst, non FDG avid on prior PET, likely benign. Consider annual follow-up pelvic ultrasound, if clinically warranted given the patient's age. Electronically Signed   By: Zadie Herter M.D.   On: 07/09/2023 22:20   DG Scapula Right Result Date: 07/07/2023 CLINICAL DATA:  Increased right shoulder/scapular pain EXAM: RIGHT SCAPULA - 2 VIEWS COMPARISON:  CT chest dated 02/24/2023 FINDINGS: Cortical irregularity and indistinct appearance of the inferior most aspect of the scapula. Degenerative changes of the right glenohumeral and acromioclavicular joints. Multiple old right rib fractures. Soft tissues are unremarkable. Partially imaged median sternotomy wires are nondisplaced. IMPRESSION: 1.  Cortical irregularity and indistinct appearance of the inferior most aspect of the scapula, which may represent a lesion secondary to multiple myeloma. 2. Degenerative changes of the right glenohumeral and acromioclavicular joints. Electronically Signed   By: Limin  Xu M.D.   On: 07/07/2023 14:13    ASSESSMENT AND PLAN: This is a very pleasant 83 years old white female with  1) history of stage I (T1a, N0, M0) thymoma type AB diagnosed in October 2023 status post resection under the care of Dr. Luna Salinas on January 28, 2022 with close resection margin and microscopic infiltration of the capsule. 2) stage II multiple  myeloma IgG subtype diagnosed in April 2024.  The patient is undergoing systemic therapy with daratumumab, Revlimid 15 mg p.o. daily for 21 days every 4 weeks in addition to Decadron 20 mg weekly with the treatment.  Status post 11 cycles.  She has been tolerating her treatment fairly well.    Multiple Myeloma Multiple myeloma, well-managed on daratumumab, Revlimid, and Decadron. She has completed 11 cycles and is preparing for cycle 12. Myeloma panel results show no disease progression. The condition is chronic and incurable, requiring ongoing treatment. Although complete remission is unlikely, the current regimen effectively manages the disease. She tolerates the treatment well, and the option to take a break was discussed. - Continue daratumumab, Revlimid, and Decadron for cycle 12 - Regular monitoring of myeloma panel results  Bone Pain Persistent pain in the right lower shoulder blade area, likely due to lesions from multiple myeloma or degenerative disease. The pain is steady and worsens when lying on her side. Radiation therapy was discussed as a targeted treatment option if the pain significantly affects her quality of life. Referral to radiation oncology was suggested for further evaluation and discussion of risks, benefits, and alternatives. - Refer to radiation oncology for evaluation of shoulder blade pain  Bone Disease Bone disease managed with monthly Zometa infusions. No new issues reported. - Continue monthly Zometa infusions  Follow-up Follow-up plan to ensure continuity of care and address any emerging issues. - Schedule follow-up appointment in 4 weeks - Instruct her to call with any new issues or concerns   For the bone disease, she will continue her treatment with Zometa but I will change the frequency to every 12 weeks. The patient was advised to call immediately if she has any other concerning symptoms in the interval. The patient voices understanding of current disease  status and treatment options and is in agreement with the current care plan.  All questions were answered. The patient knows to call the clinic with any problems, questions or concerns. We can certainly see the patient much sooner if necessary.  The total time spent in the appointment was 30 minutes.  Disclaimer: This note was dictated with voice recognition software. Similar sounding words can inadvertently be transcribed and may not be corrected upon review.

## 2023-07-23 NOTE — Patient Instructions (Signed)

## 2023-07-25 NOTE — Progress Notes (Signed)
 Histology and Location of Primary Cancer:  Stage 2 Multiple Myeloma IgG   Sites of Visceral and Bony Metastatic Disease:  07/23/2023 Dr. Lurena Sally Patient experiences persistent pain in her right lower blade area. The pain becomes more painful when she lies on her right side in bed which is her natural sleeping position  Before going to see her oncologist, patient was in pain  Past/Anticipated chemotherapy by medical oncology, if any:  07/23/2023 Dr. Marguerita Shih  Interventions: January 28, 2022 Dr. Luna Salinas Post Robotic Left Video Assisted Thoracoscopy for resection of Anterior Mediastinal Mass  Pain on a scale of 0-10 is: 6 out of 10 shoulder pain    If Spine Met(s), symptoms, if any, include: Bowel/Bladder retention or incontinence (please describe): None Numbness or weakness in extremities (please describe): Sometimes has a little bit of numbness and weakness of the extremities. Current Decadron  regimen, if applicable: Dexamethasone   Ambulatory status? Walker? Wheelchair?: Uses walker sometimes when she is walking long distances.  SAFETY ISSUES: Prior radiation? None Pacemaker/ICD? None Possible current pregnancy? None Is the patient on methotrexate? None  Current Complaints / other details:   None BP (!) 150/40 (BP Location: Right Wrist, Patient Position: Sitting, Cuff Size: Normal)   Pulse (!) 56   Temp 97.6 F (36.4 C)   Resp 20   Ht 4' 11.8" (1.519 m)   Wt 138 lb (62.6 kg)   SpO2 100%   BMI 27.13 kg/m   Wt Readings from Last 3 Encounters:  07/29/23 138 lb (62.6 kg)  07/23/23 137 lb 4.8 oz (62.3 kg)  06/25/23 138 lb 14.4 oz (63 kg)

## 2023-07-28 ENCOUNTER — Other Ambulatory Visit: Payer: Self-pay | Admitting: Internal Medicine

## 2023-07-28 DIAGNOSIS — C9000 Multiple myeloma not having achieved remission: Secondary | ICD-10-CM

## 2023-07-28 NOTE — Progress Notes (Signed)
 Radiation Oncology         [phone] ________________________________  Name: [name] MRN: [id number]  Date: 07/29/2023  DOB: 1940/05/21  Follow-Up Visit Note  Outpatient  CC: Tena Feeling, MD  Marlene Simas, MD  Diagnosis:     ICD-10-CM   1. 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 Luna Salinas on January 28, 2022 with close resection margin and microscopic infiltration of the capsule.  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 on systemic treatment now with a symptomatic right scapula lesion   Cancer Staging  Multiple myeloma (HCC) Staging form: Plasma Cell Myeloma and Plasma Cell Disorders, AJCC 8th Edition - Clinical stage from 09/11/2022: RISS Stage II (Beta-2 -microglobulin (mg/L): 8.7, Albumin  (g/dL): 2.3, ISS: Stage III, High-risk cytogenetics: Absent, LDH: Normal) - Signed by Marlene Simas, MD on 09/11/2022 Stage prefix: Initial diagnosis Beta 2 microglobulin range (mg/L): Greater than or equal to 5.5 Albumin  range (g/dL): Less than 3.5 Cytogenetics: Other mutation Serum calcium  level: Normal Serum creatinine level: Elevated Bone disease on imaging: Present  Type AB thymoma (HCC) Staging form: Thymus, AJCC 8th Edition - Pathologic stage from 03/05/2022: No Stage Recommended (pT1a, cN0, cM0) - Signed by Colie Dawes, MD on 03/05/2022 Stage prefix: Initial diagnosis Residual tumor (R): R1   CHIEF COMPLAINT: Here to discuss management of multiple myeloma.   Narrative:  The patient returns today for follow-up. She was last seen in office on 03/05/22 for her consult visit to discuss adjuvant treatment options for her AB thymoma that had been surgically removed. At that time, the patient decided to defer radiation. She did complain of right sided pain, consistent with a T11 lucent lesion visualized on PET imaging.  Further work-up was recommended. Patient proceeded with a MRI of the thoracic spine on 03/14/22 which showed a 3.3 cm mass anteriorly in the T1 vertebral body has substantially enlarged compared to the prior PET-CT and a small amount of right paraspinal extension of tumor anterior to the right costovertebral junction at the T11 vertebral level. Due to these findings, neurosurgery referral was recommended. Patient initially declined this referral however after further discussion with her son, the patient was agreeable to this referral. Patient met with neurosurgeon, Dr. Nat Badger, on 03/27/2022. Per his recommendations, the patient proceeded with a biopsy of the T11 lesion and kyphoplasty on 12/202/2023. Biopsy revealed no evidence of malignancy.   Patient presented to Dr. Marguerita Shih on 08/06/2022 after an ED visit for worsening fatigue and weakness. Imaging studies at that time revealed multiple punctate lucencies throughout the rib cage new since the prior study suspicious for metastatic disease versus multiple myeloma through CT scan. Patient proceeded with a bone marrow biopsy that was consistent with the multiple myeloma. 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   Patient proceeded with chemotherapy with her first cycle on 09/11/2022. She saw Cassie Heilingoetter PA-C on 06/25/2023 for routine follow-up under systemic treatment. AT that time, she was complaining of right scapula pain. Subsequent X-Ray on 06/25/2023 demonstrated cortical irregularity in the inferior scapula, representative of a multiple myeloma lesion.  Patient most recently presented to Dr. Marguerita Shih on 07/23/2023. He recommended proceeding with systemic treatment at that time. He also kindly referred the patient to us  for consideration of palliative radiation to the visualized right scapula lesion.    Symptomatically, the patient  reports: right shoulder blade pain that is 6/10 at baseline. She states that the pain is 10/10 when  lying down on her right side (which she prefers to do while sleeping). Patient states this pain is localized to her right shoulder blade and denies pain elsewhere. She has not taken anything for the pain.          ALLERGIES:  is allergic to crestor  [rosuvastatin  calcium ], lipitor [atorvastatin], pravastatin, simvastatin, tramadol , zetia  [ezetimibe ], codeine, penicillins, sulfa antibiotics, and vancomycin .  Meds: Current Outpatient Medications  Medication Sig Dispense Refill   acetaminophen  (TYLENOL ) 325 MG tablet Take 2 tablets (650 mg total) by mouth every 8 (eight) hours as needed.     acyclovir  (ZOVIRAX ) 200 MG capsule Take 1 capsule (200 mg total) by mouth 2 (two) times daily. 180 capsule 0   allopurinol  (ZYLOPRIM ) 100 MG tablet Take 1 tablet by mouth twice daily 60 tablet 0   colestipol (COLESTID) 1 g tablet Take 1 g by mouth 2 (two) times daily.     daratumumab -hyaluronidase -fihj (DARZALEX  FASPRO) 1800-30000 MG-UT/15ML SOLN Inject 1,800 mg into the skin once.     dexamethasone  (DECADRON ) 4 MG tablet Please take 5 tablets (20 mg) weekly on the days of treatment 40 tablet 3   lenalidomide  (REVLIMID ) 15 MG capsule Take 1 capsule (15 mg total) by mouth daily. [employer] Auth # [id number]     Date Obtained 07/08/2023. Adult female not of childbearing potential 21 capsule 0   losartan  (COZAAR ) 25 MG tablet Take 1 tablet by mouth once daily 30 tablet 8   Multiple Vitamin (MULTIVITAMIN WITH MINERALS) TABS tablet Take 1 tablet by mouth daily.     omeprazole  (PRILOSEC) 20 MG capsule Take 1 capsule by mouth once daily 30 capsule 0   potassium chloride  SA (KLOR-CON  M) 20 MEQ tablet Take 1 tablet (20 mEq total) by mouth daily. 7 tablet 0   No current facility-administered medications for this encounter.    Physical Findings:  height is 4' 11.8" (1.519 m) and weight is 138 lb (62.6 kg). Her temperature is 97.6 F (36.4 C). Her blood pressure is 150/40 (abnormal) and her pulse is 56 (abnormal). Her  respiration is 20 and oxygen saturation is 100%. .     General: Alert and oriented, in no acute distress HEENT: Head is normocephalic. Extraocular movements are intact. Oropharynx is clear. Neck: Neck is supple, no palpable cervical or supraclavicular lymphadenopathy. Heart: Regular in rate and rhythm with no murmurs, rubs, or gallops. Chest: Clear to auscultation bilaterally, with no rhonchi, wheezes, or rales. Extremities: No cyanosis or edema. Lymphatics: see Neck Exam Musculoskeletal: symmetric strength and muscle tone throughout. Tenderness to palpation along the majority of the scapula.  Neurologic: No obvious focalities. Speech is fluent.  Psychiatric: Judgment and insight are intact. Affect is appropriate.   Lab Findings: Lab Results  Component Value Date   WBC 9.5 07/23/2023   HGB 11.5 (L) 07/23/2023   HCT 34.2 (L) 07/23/2023   MCV 97.7 07/23/2023   PLT 188 07/23/2023    @LASTCHEMISTRY @  Radiographic Findings: No results found.  Impression/Plan: Stage II multiple myeloma IgG subtype diagnosed in April 2024 on systemic treatment now with a symptomatic right scapula lesion  It was a pleasure getting to see Ms. Goerke again today. We reviewed her most recent work-up. She is currently on systemic treatment for her multiple myeloma under the care of Dr. Marguerita Shih. She is unfortunately experiencing right shoulder blade pain which is consistent with a multiple myeloma lesion visualized  on X-ray from 06/25/2023. Dr. Lurena Sally recommends a palliative course of radiation treatment in efforts to alleviate her pain. Patient denies pain elsewhere.  Today, we talked to the patient and family about the findings and work-up thus far.  We discussed the natural history of multiple myeloma and general treatment, highlighting the role of radiotherapy in the management.  We discussed the available radiation techniques, and focused on the details of logistics and delivery.  We reviewed the anticipated  acute and late sequelae associated with radiation in this setting.  The patient was encouraged to ask questions that we answered to the best of our ability. A patient consent form was discussed and signed.  We retained a copy for our records.  The patient would like to proceed with radiation and is scheduled for CT simulation later today. We look forward to participating in this patient's care.   Dr. Lurena Sally anticipates 18.75 Gy in 5 fractions to the right scapula lesion - may treat adjacent rib lesions if visible today on CT imaging and deemed contributory to pain.   Of note, she will remain continue with active surveillance for her thymoma under the care of Dr. Marguerita Shih. She will receive her next CT of the chest in November of 2025.    On date of service, in total, we spent 60 minutes on this encounter. Patient was seen in person.  _____________________________________    Julio Ohm, PA-C   Colie Dawes, MD    [facility]  Radiation Oncology Direct Dial: [phone]  Fax: [phone] [email or web address]   This document serves as a record of services personally performed by Colie Dawes, MD and Julio Ohm, PA-C. It was created on her behalf by Lucky Sable, a trained medical scribe. The creation of this record is based on the scribe's personal observations and the provider's statements to them. This document has been checked and approved by the attending provider.

## 2023-07-29 ENCOUNTER — Ambulatory Visit
Admission: RE | Admit: 2023-07-29 | Discharge: 2023-07-29 | Disposition: A | Discharge status: Home / Self Care | Source: Ambulatory Visit | Attending: Radiation Oncology | Admitting: Radiation Oncology

## 2023-07-29 ENCOUNTER — Other Ambulatory Visit: Payer: Self-pay | Admitting: Internal Medicine

## 2023-07-29 VITALS — BP 150/40 | HR 56 | Temp 97.6°F | Resp 20 | Ht 59.8 in | Wt 138.0 lb

## 2023-07-29 DIAGNOSIS — Z79899 Other long term (current) drug therapy: Secondary | ICD-10-CM | POA: Diagnosis not present

## 2023-07-29 DIAGNOSIS — R7989 Other specified abnormal findings of blood chemistry: Secondary | ICD-10-CM | POA: Diagnosis not present

## 2023-07-29 DIAGNOSIS — C9000 Multiple myeloma not having achieved remission: Secondary | ICD-10-CM

## 2023-07-29 DIAGNOSIS — C37 Malignant neoplasm of thymus: Secondary | ICD-10-CM | POA: Diagnosis not present

## 2023-07-29 DIAGNOSIS — Z79624 Long term (current) use of inhibitors of nucleotide synthesis: Secondary | ICD-10-CM | POA: Diagnosis not present

## 2023-07-29 DIAGNOSIS — Z510 Encounter for antineoplastic radiation therapy: Secondary | ICD-10-CM | POA: Insufficient documentation

## 2023-07-30 ENCOUNTER — Encounter: Payer: Self-pay | Admitting: Medical Oncology

## 2023-07-30 ENCOUNTER — Telehealth: Payer: Self-pay | Admitting: Medical Oncology

## 2023-07-30 NOTE — Telephone Encounter (Signed)
 Son's email questions answered by Cassie and Dr. Romualdo Cobble gave him her instructions. LVM that if [name] chooses to pause her treatment that would include pausing the daratumumab , Revlimid , Acyclovir , dexamethasone  and Allopurinol . She can continue her zometa .

## 2023-07-31 ENCOUNTER — Other Ambulatory Visit: Payer: Self-pay | Admitting: Medical Oncology

## 2023-07-31 DIAGNOSIS — C9000 Multiple myeloma not having achieved remission: Secondary | ICD-10-CM

## 2023-07-31 MED ORDER — LENALIDOMIDE 15 MG PO CAPS: 15.0000 mg | ORAL_CAPSULE | Freq: Every day | ORAL | 0 refills | Status: DC

## 2023-08-01 DIAGNOSIS — C37 Malignant neoplasm of thymus: Secondary | ICD-10-CM | POA: Diagnosis not present

## 2023-08-01 DIAGNOSIS — C9000 Multiple myeloma not having achieved remission: Secondary | ICD-10-CM | POA: Diagnosis not present

## 2023-08-01 DIAGNOSIS — Z510 Encounter for antineoplastic radiation therapy: Secondary | ICD-10-CM | POA: Diagnosis not present

## 2023-08-05 ENCOUNTER — Ambulatory Visit
Admission: RE | Admit: 2023-08-05 | Discharge: 2023-08-05 | Disposition: A | Discharge status: Home / Self Care | Source: Ambulatory Visit | Attending: Radiation Oncology | Admitting: Radiation Oncology

## 2023-08-05 ENCOUNTER — Other Ambulatory Visit: Payer: Self-pay

## 2023-08-05 DIAGNOSIS — Z510 Encounter for antineoplastic radiation therapy: Secondary | ICD-10-CM | POA: Diagnosis not present

## 2023-08-05 DIAGNOSIS — C9000 Multiple myeloma not having achieved remission: Secondary | ICD-10-CM | POA: Diagnosis not present

## 2023-08-05 DIAGNOSIS — C37 Malignant neoplasm of thymus: Secondary | ICD-10-CM | POA: Diagnosis not present

## 2023-08-05 LAB — RAD ONC ARIA SESSION SUMMARY
Course Elapsed Days: 0
Plan Fractions Treated to Date: 1
Plan Prescribed Dose Per Fraction: 3.75 Gy
Plan Total Fractions Prescribed: 5
Plan Total Prescribed Dose: 18.75 Gy
Reference Point Dosage Given to Date: 3.75 Gy
Reference Point Session Dosage Given: 3.75 Gy
Session Number: 1

## 2023-08-06 ENCOUNTER — Other Ambulatory Visit: Payer: Self-pay

## 2023-08-06 ENCOUNTER — Ambulatory Visit
Admission: RE | Admit: 2023-08-06 | Discharge: 2023-08-06 | Disposition: A | Discharge status: Home / Self Care | Source: Ambulatory Visit | Attending: Radiation Oncology | Admitting: Radiation Oncology

## 2023-08-06 DIAGNOSIS — C37 Malignant neoplasm of thymus: Secondary | ICD-10-CM | POA: Diagnosis not present

## 2023-08-06 DIAGNOSIS — C9000 Multiple myeloma not having achieved remission: Secondary | ICD-10-CM | POA: Diagnosis not present

## 2023-08-06 DIAGNOSIS — Z510 Encounter for antineoplastic radiation therapy: Secondary | ICD-10-CM | POA: Diagnosis not present

## 2023-08-06 LAB — RAD ONC ARIA SESSION SUMMARY
Course Elapsed Days: 1
Plan Fractions Treated to Date: 2
Plan Prescribed Dose Per Fraction: 3.75 Gy
Plan Total Fractions Prescribed: 5
Plan Total Prescribed Dose: 18.75 Gy
Reference Point Dosage Given to Date: 7.5 Gy
Reference Point Session Dosage Given: 3.75 Gy
Session Number: 2

## 2023-08-07 ENCOUNTER — Ambulatory Visit
Admission: RE | Admit: 2023-08-07 | Discharge: 2023-08-07 | Disposition: A | Discharge status: Home / Self Care | Source: Ambulatory Visit | Attending: Radiation Oncology | Admitting: Radiation Oncology

## 2023-08-07 ENCOUNTER — Other Ambulatory Visit: Payer: Self-pay

## 2023-08-07 DIAGNOSIS — C9000 Multiple myeloma not having achieved remission: Secondary | ICD-10-CM | POA: Diagnosis not present

## 2023-08-07 DIAGNOSIS — Z510 Encounter for antineoplastic radiation therapy: Secondary | ICD-10-CM | POA: Diagnosis not present

## 2023-08-07 DIAGNOSIS — C37 Malignant neoplasm of thymus: Secondary | ICD-10-CM | POA: Diagnosis not present

## 2023-08-07 DIAGNOSIS — M25511 Pain in right shoulder: Secondary | ICD-10-CM | POA: Insufficient documentation

## 2023-08-07 LAB — RAD ONC ARIA SESSION SUMMARY
Course Elapsed Days: 2
Plan Fractions Treated to Date: 3
Plan Prescribed Dose Per Fraction: 3.75 Gy
Plan Total Fractions Prescribed: 5
Plan Total Prescribed Dose: 18.75 Gy
Reference Point Dosage Given to Date: 11.25 Gy
Reference Point Session Dosage Given: 3.75 Gy
Session Number: 3

## 2023-08-08 ENCOUNTER — Ambulatory Visit
Admission: RE | Admit: 2023-08-08 | Discharge: 2023-08-08 | Disposition: A | Discharge status: Home / Self Care | Source: Ambulatory Visit | Attending: Radiation Oncology | Admitting: Radiation Oncology

## 2023-08-08 ENCOUNTER — Other Ambulatory Visit: Payer: Self-pay

## 2023-08-08 DIAGNOSIS — Z510 Encounter for antineoplastic radiation therapy: Secondary | ICD-10-CM | POA: Diagnosis not present

## 2023-08-08 DIAGNOSIS — C37 Malignant neoplasm of thymus: Secondary | ICD-10-CM | POA: Diagnosis not present

## 2023-08-08 DIAGNOSIS — C9000 Multiple myeloma not having achieved remission: Secondary | ICD-10-CM | POA: Diagnosis not present

## 2023-08-08 DIAGNOSIS — M25511 Pain in right shoulder: Secondary | ICD-10-CM | POA: Diagnosis not present

## 2023-08-08 LAB — RAD ONC ARIA SESSION SUMMARY
Course Elapsed Days: 3
Plan Fractions Treated to Date: 4
Plan Prescribed Dose Per Fraction: 3.75 Gy
Plan Total Fractions Prescribed: 5
Plan Total Prescribed Dose: 18.75 Gy
Reference Point Dosage Given to Date: 15 Gy
Reference Point Session Dosage Given: 3.75 Gy
Session Number: 4

## 2023-08-11 ENCOUNTER — Other Ambulatory Visit: Payer: Self-pay

## 2023-08-11 ENCOUNTER — Ambulatory Visit
Admission: RE | Admit: 2023-08-11 | Discharge: 2023-08-11 | Disposition: A | Discharge status: Home / Self Care | Source: Ambulatory Visit | Attending: Radiation Oncology | Admitting: Radiation Oncology

## 2023-08-11 DIAGNOSIS — M25511 Pain in right shoulder: Secondary | ICD-10-CM

## 2023-08-11 DIAGNOSIS — Z510 Encounter for antineoplastic radiation therapy: Secondary | ICD-10-CM | POA: Diagnosis not present

## 2023-08-11 DIAGNOSIS — C37 Malignant neoplasm of thymus: Secondary | ICD-10-CM | POA: Diagnosis not present

## 2023-08-11 DIAGNOSIS — C9000 Multiple myeloma not having achieved remission: Secondary | ICD-10-CM | POA: Diagnosis not present

## 2023-08-11 LAB — RAD ONC ARIA SESSION SUMMARY
Course Elapsed Days: 6
Plan Fractions Treated to Date: 5
Plan Prescribed Dose Per Fraction: 3.75 Gy
Plan Total Fractions Prescribed: 5
Plan Total Prescribed Dose: 18.75 Gy
Reference Point Dosage Given to Date: 18.75 Gy
Reference Point Session Dosage Given: 3.75 Gy
Session Number: 5

## 2023-08-11 MED ORDER — SONAFINE EX EMUL
1.0000 | Freq: Two times a day (BID) | CUTANEOUS | Status: DC
  Administered 2023-08-11: 1 via TOPICAL

## 2023-08-12 NOTE — Radiation Completion Notes (Signed)
 Patient Name: [name], [name] MRN: [id number] Date of Birth: 1941-01-02 Referring Physician: Tressia Fry, M.D. Date of Service: 2023-08-12 Radiation Oncologist: Colie Dawes, M.D. [facility]                             RADIATION ONCOLOGY END OF TREATMENT NOTE     Diagnosis: C90.00 Multiple myeloma not having achieved remission Staging on 2022-03-05: Type AB thymoma (HCC) T=pT1a, N=cN0, M=cM0 Intent: Palliative     ==========DELIVERED PLANS==========  First Treatment Date: 2023-08-05 Last Treatment Date: 2023-08-11   Plan Name: Chest_R_Scpl Site: Scapula, Right Technique: 3D Mode: Photon Dose Per Fraction: 3.75 Gy Prescribed Dose (Delivered / Prescribed): 18.75 Gy / 18.75 Gy Prescribed Fxs (Delivered / Prescribed): 5 / 5     ==========ON TREATMENT VISIT DATES========== 2023-08-11     ==========UPCOMING VISITS========== 10/15/2023 [facility]  10/15/2023 [facility] EST PT 30 Heilingoetter, Cassandra L, PA-C  10/15/2023 [facility] INFUSION 2HR15MIN (135) [facility]  09/17/2023 [facility]  09/17/2023 [facility] EST PT 15 Marlene Simas, MD  09/17/2023 [facility] INFUSION 2HR15MIN (135) [facility]  09/11/2023 [facility] FOLLOW UP 20 Colie Dawes, MD  08/20/2023 [facility]  08/20/2023 [facility] EST PT 30 Heilingoetter, Cassandra L, PA-C  08/20/2023 [facility] INFUSION 2HR15MIN (135) [facility]        ==========APPENDIX - ON TREATMENT VISIT NOTES==========   See weekly On Treatment Notes in Epic for details in the Media tab (listed as Progress notes on the On Treatment Visit Dates listed above).

## 2023-08-16 NOTE — Progress Notes (Unsigned)
 [facility] OFFICE PROGRESS NOTE  Tena Feeling, MD 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 Luna Salinas on January 28, 2022  2. Palliative radiation to the painful scapular lesion under the care of Dr. Lurena Sally. Last dose on 08/11/23.   CURRENT THERAPY: 1) Starting systemic therapy with Revlimid  21 days on 7 days off, Darzalex , and 20 mg of Decadron  weekly.  First dose expected next week on 09/11/2022.  Status post 12 cycles.  2) Monthly Zometa  injections for 6 to 12 months, followed by every 3 months. Dr. Marguerita Shih changed this to every 3 months at her last appointment. This was last given on 05/28/23  INTERVAL HISTORY: ROONEY HARRALSON 83 y.o. female returns to the clinic today for a follow-up visit accompanied by her daughter in law.  The patient was last seen by Dr. Marguerita Shih on 07/23/2023.  The patient is currently undergoing treatment for multiple myeloma.  She also receives monthly injections with Zometa  due to her bone disease.  The patient is currently undergoing palliative radiation to the bone lesions in the right shoulder blade she was referred to radiation oncology and completed this on 08/11/23. At her last appointment Dr. Marguerita Shih discussed possibly giving her a break from treatment. She is struggling to make a decision about taking a break. They have some questions about this today.   She is currently on lenalidomide  and dexamethasone  for her multiple myeloma. She experiences fatigue, which she attributes to her medications. She also reports diarrhea, although her bowel movements are not particularly loose. There is a decrease in appetite,  with food described as unappealing, and she has noticed slight weight loss. No recent fevers, infections, chills, or night sweats. Occasional coughing is present, but no significant respiratory symptoms such as shortness of breath or persistent cough. No nausea or vomiting. he denies any abnormal bleeding except for easy extremity bruising.  She has a history of a sacral wound, which she suspects might be recurring. She has been laying on her back due to the scapula pain. They are going to contact her wound care provider.   She is on losartan  for blood pressure management but did not take her medication on the day of the visit. She usually takes it around 8:30 to 9:00 AM.  She recently completed radiation therapy for shoulder pain but has not noticed significant improvement in her pain levels. Discomfort is experienced when lying on her side, affecting her ability to change positions frequently.  She is here today for evaluation and repeat blood work before undergoing her next cycle of treatment with cycle #13.    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 by echo 10/2021   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/2015   Carotid artery stenosis    < 50% stenosis bilaterally by doppler 07/2016   Coronary artery disease 2008  Coronary Ca score of 331 with minimal multivessel plaque < 25% stenosis by coronary CTA 8/23   Heart murmur    per pt   Hypercholesteremia    Hypertension    Obesity    Osteopenia    declines treatment   Pneumonia 1995   Pulmonary HTN (HCC)    mild to moderate by echo 7/23 with PASP   Shoulder pain    Due to arthritis   Vitamin D insufficiency     ALLERGIES:  is allergic to crestor  [rosuvastatin  calcium ],  lipitor [atorvastatin], pravastatin, simvastatin, tramadol , zetia  [ezetimibe ], codeine, penicillins, sulfa antibiotics, and vancomycin .  MEDICATIONS:  Current Outpatient Medications  Medication Sig Dispense Refill   acetaminophen  (TYLENOL ) 325 MG tablet Take 2 tablets (650 mg total) by mouth every 8 (eight) hours as needed.     acyclovir  (ZOVIRAX ) 200 MG capsule Take 1 capsule (200 mg total) by mouth 2 (two) times daily. 180 capsule 0   allopurinol  (ZYLOPRIM ) 100 MG tablet Take 1 tablet by mouth twice daily 60 tablet 0   colestipol (COLESTID) 1 g tablet Take 1 g by mouth 2 (two) times daily.     daratumumab -hyaluronidase -fihj (DARZALEX  FASPRO) 1800-30000 MG-UT/15ML SOLN Inject 1,800 mg into the skin once.     dexamethasone  (DECADRON ) 4 MG tablet Please take 5 tablets (20 mg) weekly on the days of treatment 40 tablet 3   lenalidomide  (REVLIMID ) 15 MG capsule Take 1 capsule (15 mg total) by mouth daily. [employer] Auth # [id number]     Date Obtained 07/31/2023. Adult female not of childbearing potential 21 capsule 0   losartan  (COZAAR ) 25 MG tablet Take 1 tablet by mouth once daily 30 tablet 8   Multiple Vitamin (MULTIVITAMIN WITH MINERALS) TABS tablet Take 1 tablet by mouth daily.     omeprazole  (PRILOSEC) 20 MG capsule Take 1 capsule by mouth once daily 30 capsule 0   potassium chloride  SA (KLOR-CON  M) 20 MEQ tablet Take 1 tablet (20 mEq total) by mouth daily. 7 tablet 0   No current facility-administered medications for this visit.    SURGICAL HISTORY:  Past Surgical History:  Procedure Laterality Date   AORTIC VALVE REPLACEMENT N/A 10/14/2013   Procedure: AORTIC VALVE REPLACEMENT (AVR);  Surgeon: Bartley Lightning, MD;  Location: [facility] OR;  Service: Open Heart Surgery;  Laterality: N/A;   CARDIAC CATHETERIZATION     CATARACT EXTRACTION, BILATERAL     CHOLECYSTECTOMY  04/08/1988   ESOPHAGOGASTRODUODENOSCOPY (EGD) WITH PROPOFOL  N/A 09/05/2022   Procedure: ESOPHAGOGASTRODUODENOSCOPY (EGD) WITH  PROPOFOL ;  Surgeon: Felecia Hopper, MD;  Location: [facility] ENDOSCOPY;  Service: Gastroenterology;  Laterality: N/A;   INTRAOPERATIVE TRANSESOPHAGEAL ECHOCARDIOGRAM N/A 10/14/2013   Procedure: INTRAOPERATIVE TRANSESOPHAGEAL ECHOCARDIOGRAM;  Surgeon: Bartley Lightning, MD;  Location: [facility] OR;  Service: Open Heart Surgery;  Laterality: N/A;   IVC FILTER INSERTION N/A 09/06/2022   Procedure: IVC FILTER INSERTION;  Surgeon: Carlene Che, MD;  Location: [facility] INVASIVE CV LAB;  Service: Cardiovascular;  Laterality: N/A;   KYPHOPLASTY Bilateral 03/27/2022   Procedure: KYPHOPLASTY AND BIOPSY THORACIC ELEVEN;  Surgeon: Augusto Blonder, MD;  Location: [facility] OR;  Service: Neurosurgery;  Laterality: Bilateral;   LEFT AND RIGHT HEART CATHETERIZATION WITH CORONARY ANGIOGRAM N/A 09/16/2013   Procedure: LEFT AND RIGHT HEART CATHETERIZATION WITH CORONARY ANGIOGRAM;  Surgeon: Jacqueline Matsu, MD;  Location: [facility] CATH LAB;  Service: Cardiovascular;  Laterality: N/A;   TONSILLECTOMY      REVIEW OF SYSTEMS:   Review of Systems  Constitutional: Positive for fatigue.  Positive for decreased appetite. Negative for  chills, fever and unexpected weight change.  HENT: Negative for mouth sores, nosebleeds, sore throat and trouble swallowing.   Eyes: Negative for eye problems and icterus.  Respiratory: Negative for cough, hemoptysis, shortness of breath and wheezing.   Cardiovascular: Negative for chest pain and leg swelling.  Gastrointestinal: Improved loose stool. Negative for abdominal pain, constipation,  nausea and vomiting.  Genitourinary: Negative for bladder incontinence, difficulty urinating, dysuria, frequency and hematuria.   Musculoskeletal: Positive for intermittent scapular pain (right). Negative for back pain, gait problem, neck pain and neck stiffness.  Skin: Negative for itching and rash.  Neurological: Negative for dizziness, extremity weakness, gait problem, headaches, light-headedness and seizures.   Hematological: Negative for adenopathy. Does not bleed easily. Positive for easy bruising.  Psychiatric/Behavioral: Negative for confusion, depression and sleep disturbance. The patient is not nervous/anxious.     PHYSICAL EXAMINATION:  There were no vitals taken for this visit.  ECOG PERFORMANCE STATUS: 1  Physical Exam  Constitutional: Oriented to person, place, and time and elderly appearing female well-nourished, and in no distress.  HENT:  Head: Normocephalic and atraumatic.  Mouth/Throat: Oropharynx is clear and moist. No oropharyngeal exudate.  Eyes: Conjunctivae are normal. Right eye exhibits no discharge. Left eye exhibits no discharge. No scleral icterus.  Neck: Normal range of motion. Neck supple.  Cardiovascular: Normal rate, regular rhythm, normal heart sounds and intact distal pulses.   Pulmonary/Chest: Effort normal and breath sounds normal. No respiratory distress. No wheezes. No rales.  Abdominal: Soft. Bowel sounds are normal. Exhibits no distension and no mass. There is no tenderness.  Musculoskeletal: Normal range of motion. Exhibits no edema.  Lymphadenopathy:    No cervical adenopathy.  Neurological: Alert and oriented to person, place, and time. Exhibits muscle wasting. Ambulates with a walker.  Skin: Skin is warm and dry. No rash noted. Not diaphoretic. No erythema. No pallor.  Psychiatric: Mood, memory and judgment normal.  Vitals reviewed.  LABORATORY DATA: Lab Results  Component Value Date   WBC 9.5 07/23/2023   HGB 11.5 (L) 07/23/2023   HCT 34.2 (L) 07/23/2023   MCV 97.7 07/23/2023   PLT 188 07/23/2023      Chemistry      Component Value Date/Time   NA 144 07/23/2023 1055   NA 145 (H) 02/27/2023 1140   K 3.5 07/23/2023 1055   CL 112 (H) 07/23/2023 1055   CO2 27 07/23/2023 1055   BUN 20 07/23/2023 1055   BUN 22 02/27/2023 1140   CREATININE 0.87 07/23/2023 1055   CREATININE 0.85 01/29/2016 0910      Component Value Date/Time   CALCIUM  8.8  (L) 07/23/2023 9562   ALKPHOS 74 07/23/2023 1055   AST 22 07/23/2023 1055   ALT 33 07/23/2023 1055   BILITOT 0.5 07/23/2023 1055       RADIOGRAPHIC STUDIES:  No results found.   ASSESSMENT/PLAN:  This is a very pleasant 83 year old Caucasian female with  1) history of stage I (T1a, N0, M0) thymoma type AB diagnosed in October 2023 status post resection under the care of Dr. Luna Salinas on January 28, 2022 with close resection margin and microscopic infiltration of the capsule. 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     She is currently undergoing treatment with systemic therapy with daratumumab , Revlimid  15 mg p.o. daily for 21 days every 4 weeks in addition to Decadron  20 mg weekly with the treatment.  She is status  post 12 cycles and tolerating this well.  The patient was seen with Dr. Marguerita Shih today.  Dr. Marguerita Shih discussed again the option of continuing treatment versus stopping treatment and taking a break for 3 months.  Dr. Marguerita Shih prefers taking breaks from treatment as opposed to holding part of her treatment.  The patient is still contemplating her option and she will think about it and let us  know.  If she calls the clinic in the interval and decides to take a break from treatment then we would recommend canceling her current appointments and rescheduling a lab visit in 3 months from now and a follow-up visit in the office 1 week later.  For now she will continue on treatment.   Labs were reviewed. Recommend that she proceed with cycle #13 today as scheduled.    Sacral wound Possible recurrence. Emphasized pressure relief and position changes. Shoulder pain may hinder position changes, contributing to wound recurrence. - Make an appointment with wound care specialist. - Advise changing positions every two hours and using pillows or wedges for pressure relief.   Hypertension Managed with losartan . Elevated blood pressure due to missed dose. -  Advise taking losartan  as soon as possible today. - Recheck blood pressure after taking medication.   For her history of thymic carcinoma, we will order CT scans annually. Her last scan was in November 2024. She is not due for her scan at this time.    She will continue to have Zometa  every 12 weeks. She is not due for this at this time.    We will see her back for follow-up visit in 4 weeks before undergoing her next cycle of treatment.    For the scapula pain, she is currently undergoing pallaitive radiation. She completed this a few days ago on 08/11/23    For fatigue, we discussed activity as tolerated to prevent deconditioning and good nutrition.   The patient was advised to call immediately if she has any concerning symptoms in the interval. The patient voices understanding of current disease status and treatment options and is in agreement with the current care plan. All questions were answered. The patient knows to call the clinic with any problems, questions or concerns. We can certainly see the patient much sooner if necessary    No orders of the defined types were placed in this encounter.   Juell Radney L Tinia Oravec, PA-C 08/16/23  ADDENDUM: Hematology/Oncology Attending: I had a face-to-face encounter with the patient today.  I reviewed her records, lab and recommended her care plan.  This is a very pleasant 83 years old white female with stage II multiple myeloma, IgG subtype diagnosed in May 2024.  She also has a history of a stage Ia thymoma status postresection.  The patient has been on treatment for multiple myeloma with subcutaneous Darzalex , Revlimid  and Decadron  and has been tolerating her treatment fairly well she status post 12 cycles of treatment.  She has also been on Zometa  infusion recently every 3 months.  The patient had significant improvement of her condition.  We have a discussion with the patient and her son last visit about holding her treatment for few  months and close monitoring but the patient was reluctant about the discontinuation of treatment at this point.  She was here today accompanied by her daughter-in-law and she brought the same issue and I explained to the patient that if we decide to hold her treatment will be monitor her closely with repeat myeloma panel every 3 months.  She will think about it with the family and let us  know with her decision next visit.  She will proceed with her treatment today as planned. The patient will come back for follow-up visit in 4 weeks for evaluation if she decided to continue with the treatment or in 3 months with repeat myeloma panel if she decide to have a break off the treatment for few months. She was advised to call immediately if she has any other concerning symptoms in the interval. The total time spent in the appointment was 30 minutes including review of chart and various tests results, discussions about plan of care and coordination of care plan . Disclaimer: This note was dictated with voice recognition software. Similar sounding words can inadvertently be transcribed and may be missed upon review. Aurelio Blower, MD

## 2023-08-19 ENCOUNTER — Other Ambulatory Visit: Payer: Self-pay | Admitting: Physician Assistant

## 2023-08-19 DIAGNOSIS — C9000 Multiple myeloma not having achieved remission: Secondary | ICD-10-CM

## 2023-08-20 ENCOUNTER — Inpatient Hospital Stay

## 2023-08-20 ENCOUNTER — Inpatient Hospital Stay: Attending: Internal Medicine | Admitting: Physician Assistant

## 2023-08-20 ENCOUNTER — Inpatient Hospital Stay: Attending: Internal Medicine

## 2023-08-20 VITALS — BP 160/71 | HR 51 | Temp 97.7°F | Resp 51 | Wt 134.6 lb

## 2023-08-20 VITALS — BP 160/71 | Resp 19

## 2023-08-20 DIAGNOSIS — Z5112 Encounter for antineoplastic immunotherapy: Secondary | ICD-10-CM | POA: Diagnosis not present

## 2023-08-20 DIAGNOSIS — C9000 Multiple myeloma not having achieved remission: Secondary | ICD-10-CM | POA: Diagnosis not present

## 2023-08-20 DIAGNOSIS — Z85238 Personal history of other malignant neoplasm of thymus: Secondary | ICD-10-CM | POA: Diagnosis not present

## 2023-08-20 DIAGNOSIS — Z923 Personal history of irradiation: Secondary | ICD-10-CM | POA: Diagnosis not present

## 2023-08-20 DIAGNOSIS — M8580 Other specified disorders of bone density and structure, unspecified site: Secondary | ICD-10-CM | POA: Diagnosis not present

## 2023-08-20 DIAGNOSIS — Z79899 Other long term (current) drug therapy: Secondary | ICD-10-CM | POA: Diagnosis not present

## 2023-08-20 DIAGNOSIS — Z79624 Long term (current) use of inhibitors of nucleotide synthesis: Secondary | ICD-10-CM | POA: Insufficient documentation

## 2023-08-20 DIAGNOSIS — Z7961 Long term (current) use of immunomodulator: Secondary | ICD-10-CM | POA: Insufficient documentation

## 2023-08-20 LAB — CBC WITH DIFFERENTIAL (CANCER CENTER ONLY)
Abs Immature Granulocytes: 0.02 10*3/uL (ref 0.00–0.07)
Basophils Absolute: 0.1 10*3/uL (ref 0.0–0.1)
Basophils Relative: 2 %
Eosinophils Absolute: 0.1 10*3/uL (ref 0.0–0.5)
Eosinophils Relative: 1 %
HCT: 35.8 % — ABNORMAL LOW (ref 36.0–46.0)
Hemoglobin: 12.3 g/dL (ref 12.0–15.0)
Immature Granulocytes: 0 %
Lymphocytes Relative: 17 %
Lymphs Abs: 0.9 10*3/uL (ref 0.7–4.0)
MCH: 33.5 pg (ref 26.0–34.0)
MCHC: 34.4 g/dL (ref 30.0–36.0)
MCV: 97.5 fL (ref 80.0–100.0)
Monocytes Absolute: 0.7 10*3/uL (ref 0.1–1.0)
Monocytes Relative: 13 %
Neutro Abs: 3.6 10*3/uL (ref 1.7–7.7)
Neutrophils Relative %: 67 %
Platelet Count: 156 10*3/uL (ref 150–400)
RBC: 3.67 MIL/uL — ABNORMAL LOW (ref 3.87–5.11)
RDW: 14.8 % (ref 11.5–15.5)
WBC Count: 5.4 10*3/uL (ref 4.0–10.5)
nRBC: 0 % (ref 0.0–0.2)

## 2023-08-20 LAB — CMP (CANCER CENTER ONLY)
ALT: 30 U/L (ref 0–44)
AST: 20 U/L (ref 15–41)
Albumin: 3.9 g/dL (ref 3.5–5.0)
Alkaline Phosphatase: 67 U/L (ref 38–126)
Anion gap: 6 (ref 5–15)
BUN: 22 mg/dL (ref 8–23)
CO2: 28 mmol/L (ref 22–32)
Calcium: 9.1 mg/dL (ref 8.9–10.3)
Chloride: 110 mmol/L (ref 98–111)
Creatinine: 0.83 mg/dL (ref 0.44–1.00)
GFR, Estimated: >60 mL/min (ref >60)
Glucose, Bld: 95 mg/dL (ref 70–99)
Potassium: 3.7 mmol/L (ref 3.5–5.1)
Sodium: 144 mmol/L (ref 135–145)
Total Bilirubin: 0.6 mg/dL (ref 0.0–1.2)
Total Protein: 5.6 g/dL — ABNORMAL LOW (ref 6.5–8.1)

## 2023-08-20 MED ORDER — DIPHENHYDRAMINE HCL 25 MG PO CAPS
50.0000 mg | ORAL_CAPSULE | Freq: Once | ORAL | Status: AC
  Administered 2023-08-20: 50 mg via ORAL
  Filled 2023-08-20: qty 2

## 2023-08-20 MED ORDER — DEXAMETHASONE 4 MG PO TABS
20.0000 mg | ORAL_TABLET | Freq: Once | ORAL | Status: AC
  Administered 2023-08-20: 20 mg via ORAL
  Filled 2023-08-20: qty 5

## 2023-08-20 MED ORDER — DARATUMUMAB-HYALURONIDASE-FIHJ 1800-30000 MG-UT/15ML ~~LOC~~ SOLN
1800.0000 mg | Freq: Once | SUBCUTANEOUS | Status: AC
  Administered 2023-08-20: 1800 mg via SUBCUTANEOUS
  Filled 2023-08-20: qty 15

## 2023-08-20 MED ORDER — SODIUM CHLORIDE 0.9 % IV SOLN: INTRAVENOUS | Status: DC

## 2023-08-20 MED ORDER — ACETAMINOPHEN 325 MG PO TABS
650.0000 mg | ORAL_TABLET | Freq: Once | ORAL | Status: AC
Start: 2023-08-20 — End: 2023-08-20
  Administered 2023-08-20: 650 mg via ORAL
  Filled 2023-08-20: qty 2

## 2023-08-20 MED ORDER — ZOLEDRONIC ACID 4 MG/100ML IV SOLN
4.0000 mg | Freq: Once | INTRAVENOUS | Status: AC
  Administered 2023-08-20: 4 mg via INTRAVENOUS
  Filled 2023-08-20: qty 100

## 2023-08-20 NOTE — Patient Instructions (Signed)

## 2023-08-21 ENCOUNTER — Encounter: Payer: Self-pay | Admitting: Internal Medicine

## 2023-08-21 ENCOUNTER — Telehealth: Payer: Self-pay | Admitting: Physician Assistant

## 2023-08-21 ENCOUNTER — Other Ambulatory Visit: Payer: Self-pay | Admitting: Physician Assistant

## 2023-08-21 NOTE — Telephone Encounter (Signed)
 Called the patient to inform her of updated appointment details. The patient stated she has decided to hold off on treatment for 3 months. I informed Fatima Honour and Diane of this and asked they they call to confirm this information.

## 2023-08-22 ENCOUNTER — Encounter: Payer: Self-pay | Admitting: Medical Oncology

## 2023-08-22 ENCOUNTER — Other Ambulatory Visit: Payer: Self-pay | Admitting: Internal Medicine

## 2023-08-22 DIAGNOSIS — C9000 Multiple myeloma not having achieved remission: Secondary | ICD-10-CM

## 2023-08-29 NOTE — Progress Notes (Signed)
 Patient identity verified x2.  Patient follow-up for a diagnosis of: Multiple Myeloma not having achieved remission   Location-   Right Scapula   They completed their radiation on: 08/11/2023  Patient is doing well post radiation treatment  Does the patient complain of any of the following: Chest pains/ SOB: Patient denies, however she does have 4 out of 10 upper back pain on the right side when she lays down.  Headaches/ Dizziness: Patient denies Post radiation skin issues Patient denies Joint pain/ Swelling: Patient denies Motor function issues: Patient denies Range of motion limitations: Challenging getting out of the couch initially.  Fatigue post radiation: Yes, patient does complain of fatigue.  Appetite good/fair/poor: Appetite is good Breakfast: cereal, toast or ensure. Sometimes has bacon and eggs. Lunch casserole, or a salad. Makes pies once and a while, tries to eat a variety of foods.  Additional comments if applicable:  None  Encouraged patient to keep up with their appointments and call the office for any questions or concerns

## 2023-08-31 ENCOUNTER — Other Ambulatory Visit: Payer: Self-pay | Admitting: Internal Medicine

## 2023-08-31 DIAGNOSIS — C9000 Multiple myeloma not having achieved remission: Secondary | ICD-10-CM

## 2023-09-02 ENCOUNTER — Encounter: Payer: Self-pay | Admitting: Internal Medicine

## 2023-09-04 ENCOUNTER — Encounter: Attending: Physician Assistant | Admitting: Physician Assistant

## 2023-09-04 DIAGNOSIS — C9000 Multiple myeloma not having achieved remission: Secondary | ICD-10-CM | POA: Diagnosis not present

## 2023-09-04 DIAGNOSIS — L988 Other specified disorders of the skin and subcutaneous tissue: Secondary | ICD-10-CM | POA: Insufficient documentation

## 2023-09-04 DIAGNOSIS — M6281 Muscle weakness (generalized): Secondary | ICD-10-CM | POA: Diagnosis not present

## 2023-09-11 ENCOUNTER — Ambulatory Visit
Admission: RE | Admit: 2023-09-11 | Discharge: 2023-09-11 | Disposition: A | Discharge status: Home / Self Care | Source: Ambulatory Visit | Attending: Radiation Oncology | Admitting: Radiation Oncology

## 2023-09-11 DIAGNOSIS — C9000 Multiple myeloma not having achieved remission: Secondary | ICD-10-CM

## 2023-09-15 ENCOUNTER — Other Ambulatory Visit: Payer: Self-pay | Admitting: Physician Assistant

## 2023-09-15 ENCOUNTER — Telehealth: Payer: Self-pay

## 2023-09-15 DIAGNOSIS — C9000 Multiple myeloma not having achieved remission: Secondary | ICD-10-CM

## 2023-09-15 NOTE — Telephone Encounter (Signed)
 T/C from [facility] wanting to confirm Revlimid  hold.  Confirmed pt's request for a 3 month hold with pharmacy and when she resumes treatment we will send in a new prescription

## 2023-09-15 NOTE — Telephone Encounter (Signed)
 She is on treatment break. No longer needs allopurinol 

## 2023-09-17 ENCOUNTER — Ambulatory Visit

## 2023-09-17 ENCOUNTER — Ambulatory Visit: Admitting: Internal Medicine

## 2023-09-17 ENCOUNTER — Other Ambulatory Visit

## 2023-09-18 ENCOUNTER — Ambulatory Visit: Admitting: Physician Assistant

## 2023-09-18 ENCOUNTER — Other Ambulatory Visit

## 2023-09-18 ENCOUNTER — Ambulatory Visit

## 2023-09-29 ENCOUNTER — Telehealth: Payer: Self-pay | Admitting: Medical Oncology

## 2023-09-29 NOTE — Telephone Encounter (Signed)
 Returned call to [name] regarding [name]'s condition. [name] is experiencing increased back pain, specifically in the lower scapular area. The pain is impacting her mobility. She describes the pain as dull in nature. Sitting or laying on the couch seems to help alleviate the pain somewhat. Tylenol  provides some relief as well.    Next med onc appt is in August. Will ask Dr. Sherrod Kenny to advise.

## 2023-09-30 ENCOUNTER — Telehealth: Payer: Self-pay | Admitting: Radiology

## 2023-09-30 NOTE — Telephone Encounter (Signed)
 [name] notified of Dr. Loni recommendation : We may need to get some more imaging on her if she has worsening pain in new areas. But if this is just pain in the right scapula that has been refractory to radiation then we may need to get her in with palliative care for pain management or see what med onc recommends. I  told [name] someone from radiation will call Teka and /or him today.

## 2023-10-01 ENCOUNTER — Other Ambulatory Visit: Payer: Self-pay | Admitting: Radiology

## 2023-10-01 ENCOUNTER — Encounter: Payer: Self-pay | Admitting: Internal Medicine

## 2023-10-01 DIAGNOSIS — C9000 Multiple myeloma not having achieved remission: Secondary | ICD-10-CM

## 2023-10-01 NOTE — Telephone Encounter (Signed)
 I called the patient x2 to discuss her current symptoms, but was unable to reach her. I left a voicemail and will call her back tomorrow again.

## 2023-10-01 NOTE — Progress Notes (Signed)
 Patient is complaining of a 1-week history of right-sided pain. She was previously treated in May of this year with palliative radiation to the right scapula. Although she still experiences pain to this area, she feels as though this is a new pain that is more lateral. She rates the pain as 8/10 and is constant. She has been taking Tylenol  for this pain with little relief. She denies any spinal or radiating pain. Dr. Izell recommends PET imaging for further evaluation. PET imaging ordered today. Referral also made to palliative care for assistance with pain management.     Leeroy Due, PA-C

## 2023-10-08 ENCOUNTER — Telehealth: Payer: Self-pay | Admitting: *Deleted

## 2023-10-08 NOTE — Telephone Encounter (Signed)
 RETURNED PATIENT'S SON'S PHONE CALL, SPOKE WITH PATIENT'S SON- [name]

## 2023-10-09 ENCOUNTER — Encounter: Payer: Self-pay | Admitting: Nurse Practitioner

## 2023-10-09 ENCOUNTER — Inpatient Hospital Stay: Attending: Internal Medicine | Admitting: Nurse Practitioner

## 2023-10-09 VITALS — BP 196/53 | HR 58 | Temp 98.4°F | Resp 13 | Wt 134.7 lb

## 2023-10-09 DIAGNOSIS — K5903 Drug induced constipation: Secondary | ICD-10-CM | POA: Diagnosis not present

## 2023-10-09 DIAGNOSIS — C9000 Multiple myeloma not having achieved remission: Secondary | ICD-10-CM | POA: Diagnosis not present

## 2023-10-09 DIAGNOSIS — G893 Neoplasm related pain (acute) (chronic): Secondary | ICD-10-CM | POA: Diagnosis not present

## 2023-10-09 DIAGNOSIS — Z515 Encounter for palliative care: Secondary | ICD-10-CM | POA: Diagnosis not present

## 2023-10-09 DIAGNOSIS — R530 Neoplastic (malignant) related fatigue: Secondary | ICD-10-CM | POA: Diagnosis not present

## 2023-10-09 MED ORDER — LIDOCAINE 5 % EX PTCH: 1.0000 | MEDICATED_PATCH | CUTANEOUS | 3 refills | Status: DC

## 2023-10-09 NOTE — Progress Notes (Signed)
 Palliative Medicine [facility]  [phone] [phone]   Name: [name] Date: 10/09/2023 MRN: [id number]  DOB: 04-08-1941  Patient Care Team: [name] Trula SQUIBB, MD as PCP - General (Internal Medicine) [name] Wilbert SAUNDERS, MD as PCP - Cardiology (Cardiology) [name] Wilbert SAUNDERS, MD as Consulting Physician (Cardiology)    INTERVAL HISTORY: [name] is a 83 y.o. 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 Palliative ask to see for symptom management and goals of care.   SOCIAL HISTORY:     reports that she has never smoked. She has never used smokeless tobacco. She reports that she does not drink alcohol  and does not use drugs.  ADVANCE DIRECTIVES:  Advanced directives on file  CODE STATUS: DNR  PAST 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 by echo 10/2021   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/2015   Carotid artery stenosis    < 50% stenosis bilaterally by doppler 07/2016   Coronary artery disease 2008   Coronary Ca score of 331 with minimal multivessel plaque < 25% stenosis by coronary CTA 8/23   Heart murmur    per pt   Hypercholesteremia    Hypertension    Obesity    Osteopenia    declines treatment   Pneumonia 1995   Pulmonary HTN (HCC)    mild to moderate by echo 7/23 with PASP   Shoulder pain    Due to arthritis   Vitamin D insufficiency     ALLERGIES:  is allergic to crestor  [rosuvastatin  calcium ], lipitor [atorvastatin], pravastatin, simvastatin, tramadol , zetia  [ezetimibe ], codeine, penicillins, sulfa antibiotics, and  vancomycin .  MEDICATIONS:  Current Outpatient Medications  Medication Sig Dispense Refill   acetaminophen  (TYLENOL ) 325 MG tablet Take 2 tablets (650 mg total) by mouth every 8 (eight) hours as needed.     acyclovir  (ZOVIRAX ) 200 MG capsule Take 1 capsule (200 mg total) by mouth 2 (two) times daily. 180 capsule 0   allopurinol  (ZYLOPRIM ) 100 MG tablet Take 1 tablet by mouth twice daily 60 tablet 0   colestipol (COLESTID) 1 g tablet Take 1 g by mouth 2 (two) times daily.     daratumumab -hyaluronidase -fihj (DARZALEX  FASPRO) 1800-30000 MG-UT/15ML SOLN Inject 1,800 mg into the skin once.     dexamethasone  (DECADRON ) 4 MG tablet Please take 5 tablets (20 mg) weekly on the days of treatment 40 tablet 3   lenalidomide  (REVLIMID ) 15 MG capsule Take 1 capsule (15 mg total) by mouth daily. [employer] Auth # [id number]     Date Obtained 07/31/2023. Adult female not of childbearing potential 21 capsule 0   lidocaine  (LIDODERM ) 5 % Place 1 patch onto the skin daily. Remove & Discard patch within 12 hours or as directed by MD 30 patch 3   losartan  (COZAAR ) 25 MG tablet Take 1 tablet by mouth once daily 30 tablet 8   Multiple Vitamin (MULTIVITAMIN WITH MINERALS) TABS tablet Take 1 tablet by mouth daily.     omeprazole  (PRILOSEC) 20 MG capsule Take 1 capsule by mouth once daily 30 capsule 0  potassium chloride  SA (KLOR-CON  M) 20 MEQ tablet Take 1 tablet (20 mEq total) by mouth daily. 7 tablet 0   No current facility-administered medications for this visit.    VITAL SIGNS: BP (!) 196/53 (BP Location: Right Arm, Patient Position: Sitting) Comment: Nurse was notified  Pulse (!) 58 Comment: Nurse was notified  Temp 98.4 F (36.9 C) (Temporal)   Resp 13   Wt 134 lb 11.2 oz (61.1 kg)   SpO2 99%   BMI 26.48 kg/m  Filed Weights   10/09/23 1108  Weight: 134 lb 11.2 oz (61.1 kg)    Estimated body mass index is 26.48 kg/m as calculated from the following:   Height as of 07/29/23: 4' 11.8 (1.519 m).   Weight  as of this encounter: 134 lb 11.2 oz (61.1 kg).   PERFORMANCE STATUS (ECOG) : 3 - Symptomatic, >50% confined to bed   Physical Exam General: NAD Cardiovascular: regular rate and rhythm Pulmonary: normal breathing pattern  Extremities: no edema, no joint deformities Skin: no rashes Neurological: AAO x4  IMPRESSION: Discussed the use of AI scribe software for clinical note transcription with the patient, who gave verbal consent to proceed.  History of Present Illness [name] is an 83 year old female who presents to clinic for symptom management follow-up. Patient has not been seen by myself since 2024 due to doing well and no specific unmet palliative needs. She is now having increased cancer related back pain. She is accompanied by her daughter-in-law, [name]  Denies concerns for nausea or vomiting. Her energy levels are low. Her appetite is generally okay, and she maintains her weight at 134 pounds. She consumes at least one protein supplement drink daily, sometimes two, especially when her appetite is low.  She reports diarrhea for the past week, occurring more than three times a day, sometimes up to four times. She takes cholestyramine twice daily, which initially helped but is no longer effective. This is managed by her PCP. Advised further follow-up for possible adjustments to medication.  We discussed [name]'s pain at length. She experiences sharp, mostly constant pain in her lower back, primarily on the right side, extending across the back. Movement exacerbates the pain, while sitting or lying down provides relief. No radiation of pain or numbness down her legs or around to the front.  She manages her pain with Tylenol  Extra Strength, taking one 500 mg tablet as needed, which provides relief for a couple of hours. She has used a Salonpas patch in the past, which was helpful, and previously found benefit from a prescription lidocaine  patch. She has a history of using  tramadol , oxycodone , and hydrocodone  for pain management, with tramadol  causing drowsiness and confusion.  Patient is reluctant to consider stronger oral medications at this time. Her daughter-in-law mentions upcoming PET scan allowing them time to gain better understanding of new pain and possible interventions based on findings.  We discussed use of hydrocodone  for severe pain including potential side effects, administration, and efficacy.  After extensive discussion patient and family would like to trial lidocaine  patches in addition to extra strength Tylenol  1000 mg every 8 hours.  If pain continues to be uncontrolled or worsens patient knows to contact office and we can discuss additional pain management interventions.  All questions answered and support provided.  I discussed the importance of continued conversation with family and their medical providers regarding overall plan of care and treatment options, ensuring decisions are within the context of the patients values  and GOCs.  Assessment & Plan Cancer related lower back pain Lower back pain primarily on the right side with sharp, intermittent pain rated at 8/10. Tylenol  provides temporary relief. Tramadol , oxycodone , and hydrocodone  caused drowsiness. Lidocaine  patches provided some relief. - Prescribe lidocaine  patch for 12 hours on, 12 hours off. - Instruct to take Tylenol  500 mg to 1000 mg every 8 hours. - Consider hydrocodone  if pain persists. - Follow up on July 15th to reassess pain management. - Advise to call if pain management is inadequate before July 15th.  Diarrhea Diarrhea for the past week with bowel movements more than three times a day. Cholestyramine twice a day is no longer effective. - Recommend contacting primary care provider to adjust cholestyramine to three times a day, 30 minutes before each meal.  Fatigue Fatigue likely multifactorial, possibly related to radiation therapy and other ongoing treatments. -  Monitor energy levels and provide supportive care.   Patient expressed understanding and was in agreement with this plan. She also understands that She can call the clinic at any time with any questions, concerns, or complaints.   Any controlled substances utilized were prescribed in the context of palliative care. PDMP has been reviewed.   Visit consisted of counseling and education dealing with the complex and emotionally intense issues of symptom management and palliative care in the setting of serious and potentially life-threatening illness.Greater than 50%  of this time was spent counseling and coordinating care related to the above assessment and plan.  Levon Borer, AGPCNP-BC  Palliative Medicine Team/[facility]  *Please note that this is a verbal dictation therefore any spelling or grammatical errors are due to the Dragon Medical One system interpretation.

## 2023-10-14 ENCOUNTER — Encounter
Admission: RE | Admit: 2023-10-14 | Discharge: 2023-10-14 | Disposition: A | Discharge status: Home / Self Care | Source: Ambulatory Visit | Attending: Radiology | Admitting: Radiology

## 2023-10-14 DIAGNOSIS — C9000 Multiple myeloma not having achieved remission: Secondary | ICD-10-CM | POA: Diagnosis not present

## 2023-10-14 LAB — GLUCOSE, CAPILLARY: Glucose-Capillary: 108 mg/dL — ABNORMAL HIGH (ref 70–99)

## 2023-10-14 MED ORDER — FLUDEOXYGLUCOSE F - 18 (FDG) INJECTION
7.5700 | Freq: Once | INTRAVENOUS | Status: AC | PRN
  Administered 2023-10-14: 7.57 via INTRAVENOUS

## 2023-10-15 ENCOUNTER — Ambulatory Visit: Admitting: Internal Medicine

## 2023-10-15 ENCOUNTER — Ambulatory Visit

## 2023-10-15 ENCOUNTER — Other Ambulatory Visit

## 2023-10-15 ENCOUNTER — Ambulatory Visit: Admitting: Physician Assistant

## 2023-10-20 NOTE — Progress Notes (Signed)
 Radiation Oncology         [phone] ________________________________  Name: [name] MRN: [id number]  Date: 10/21/2023  DOB: 1940-06-18  Follow-Up Visit Note  Outpatient  CC: Dwight Trula SQUIBB, MD  Dwight Trula SQUIBB, MD  Diagnosis and Previous Radiation Treatments:   No diagnosis found. ***  ==========DELIVERED PLANS==========  First Treatment Date: 2023-08-05 Last Treatment Date: 2023-08-11   Plan Name: Chest_R_Scpl Site: Scapula, Right Technique: 3D Mode: Photon Dose Per Fraction: 3.75 Gy Prescribed Dose (Delivered / Prescribed): 18.75 Gy / 18.75 Gy Prescribed Fxs (Delivered / Prescribed): 5 / 5  1) history of stage I (T1a, N0, M0) thymoma type AB diagnosed in October 2023 status post resection under the care of Dr. Kerrin on January 28, 2022 with close resection margin and microscopic infiltration of the capsule.  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 on systemic treatment now with a symptomatic right scapula lesion    Cancer Staging  Multiple myeloma (HCC) Staging form: Plasma Cell Myeloma and Plasma Cell Disorders, AJCC 8th Edition - Clinical stage from 09/11/2022: RISS Stage II (Beta-2 -microglobulin (mg/L): 8.7, Albumin  (g/dL): 2.3, ISS: Stage III, High-risk cytogenetics: Absent, LDH: Normal) - Signed by Sherrod Sherrod, MD on 09/11/2022 Stage prefix: Initial diagnosis Beta 2 microglobulin range (mg/L): Greater than or equal to 5.5 Albumin  range (g/dL): Less than 3.5 Cytogenetics: Other mutation Serum calcium  level: Normal Serum creatinine level: Elevated Bone disease on imaging: Present  Type AB thymoma (HCC) Staging form: Thymus, AJCC 8th Edition - Pathologic stage from 03/05/2022: pT1a, cN0, cM0 - Signed by Izell Domino, MD on 03/05/2022 Stage prefix: Initial diagnosis Residual tumor (R): R1   CHIEF COMPLAINT: Here to discuss management of multiple myeloma.   Narrative:  The patient returns today for follow-up to review the  results of her most recent imaging.   She presented with new/worsening right-sided back pain which prompted follow-up imaging. PET on 10/14/2023 demonstrates an interval marked response to therapy with resolution of the diffuse nodular hypermetabolic disease seen throughout osseous anatomy on the previous study; a focal small area of hypermetabolism in  he posterior cortex of the upper sacrum, not definitely seen previously although there was more diffuse nodular hypermetabolism in the sacrum on the prior study; and interval progression of simple appearing cyst in the left adnexal space measuring 5.8 cm today compared to 5.3 cm previously, thought to be likely benign in nature.   ***         ALLERGIES:  is allergic to crestor  [rosuvastatin  calcium ], lipitor [atorvastatin], pravastatin, simvastatin, tramadol , zetia  [ezetimibe ], codeine, penicillins, sulfa antibiotics, and vancomycin .  Meds: Current Outpatient Medications  Medication Sig Dispense Refill   acetaminophen  (TYLENOL ) 325 MG tablet Take 2 tablets (650 mg total) by mouth every 8 (eight) hours as needed.     acyclovir  (ZOVIRAX ) 200 MG capsule Take 1 capsule (200 mg total) by mouth 2 (two) times daily. 180 capsule 0   allopurinol  (ZYLOPRIM ) 100 MG tablet Take 1 tablet by mouth twice daily 60 tablet 0   colestipol (COLESTID) 1 g tablet Take 1 g by mouth 2 (two) times daily.     daratumumab -hyaluronidase -fihj (DARZALEX  FASPRO) 1800-30000 MG-UT/15ML SOLN Inject 1,800 mg into the skin once.     dexamethasone  (DECADRON ) 4 MG tablet Please take 5 tablets (20 mg) weekly on the days of treatment 40 tablet 3   lenalidomide  (REVLIMID ) 15 MG capsule Take 1 capsule (15 mg total) by mouth daily. [employer] # [id number]  Date Obtained 07/31/2023. Adult female not of childbearing potential 21 capsule 0   lidocaine  (LIDODERM ) 5 % Place 1 patch onto the skin daily. Remove & Discard patch within 12 hours or as directed by MD 30 patch 3   losartan  (COZAAR )  25 MG tablet Take 1 tablet by mouth once daily 30 tablet 8   Multiple Vitamin (MULTIVITAMIN WITH MINERALS) TABS tablet Take 1 tablet by mouth daily.     omeprazole  (PRILOSEC) 20 MG capsule Take 1 capsule by mouth once daily 30 capsule 0   potassium chloride  SA (KLOR-CON  M) 20 MEQ tablet Take 1 tablet (20 mEq total) by mouth daily. 7 tablet 0   No current facility-administered medications for this visit.    Physical Findings:  vitals were not taken for this visit. .     General: Alert and oriented, in no acute distress HEENT: Head is normocephalic. Extraocular movements are intact. Oropharynx is clear. Neck: Neck is supple, no palpable cervical or supraclavicular lymphadenopathy. Heart: Regular in rate and rhythm with no murmurs, rubs, or gallops. Chest: Clear to auscultation bilaterally, with no rhonchi, wheezes, or rales. Extremities: No cyanosis or edema. Lymphatics: see Neck Exam Musculoskeletal: symmetric strength and muscle tone throughout. Tenderness to palpation along the majority of the scapula.  Neurologic: No obvious focalities. Speech is fluent.  Psychiatric: Judgment and insight are intact. Affect is appropriate.   Lab Findings: Lab Results  Component Value Date   WBC 5.4 08/20/2023   HGB 12.3 08/20/2023   HCT 35.8 (L) 08/20/2023   MCV 97.5 08/20/2023   PLT 156 08/20/2023    @LASTCHEMISTRY @  Radiographic Findings: NM PET Image Restag (PS) Skull Base To Thigh Result Date: 10/16/2023 CLINICAL DATA:  Subsequent treatment strategy for thymoma and multiple myeloma. EXAM: NUCLEAR MEDICINE PET SKULL BASE TO THIGH TECHNIQUE: 7.6 mCi F-18 FDG was injected intravenously. Full-ring PET imaging was performed from the skull base to thigh after the radiotracer. CT data was obtained and used for attenuation correction and anatomic localization. Fasting blood glucose: 108 mg/dl COMPARISON:  94/83/7975 FINDINGS: Mediastinal blood pool activity: SUV max 2.3 Liver activity: SUV max NA  NECK: No hypermetabolic lymph nodes in the neck. Incidental CT findings: None. CHEST: No hypermetabolic mediastinal or hilar nodes. No suspicious pulmonary nodules on the CT scan. Incidental CT findings: Ascending thoracic aorta measures 4 cm diameter. Status post aortic valve replacement. Moderate atherosclerotic calcification is noted in the wall of the thoracic aorta. ABDOMEN/PELVIS: No abnormal hypermetabolic activity within the liver, pancreas, adrenal glands, or spleen. No hypermetabolic lymph nodes in the abdomen or pelvis. Incidental CT findings: IVC filter again noted. Cholecystectomy. There is moderate atherosclerotic calcification of the abdominal aorta without aneurysm. Interval progression of simple appearing cyst in the left adnexal space measuring 5.8 cm today compared to 5.3 cm previously. SKELETON: Scattered areas of bony hypermetabolism are evident, decreased in the interval. Scattered uptake in the ribs bilaterally seems to be mainly associated with healing fractures. Hypermetabolism seen previously in the thoracic spine and shoulders bilaterally as largely resolved. The widespread nodular accumulation of radiotracer in the bony pelvis seen previously has resolved as has the nodular hypermetabolic uptake in the lumbar spine. Marked hypermetabolism in the L5 vertebral body previously demonstrated SUV max = 14.1 (remeasured) versus 2.1 on today's exam, blood pool levels. Focal small area of hypermetabolism is identified in the posterior cortex of the upper sacrum, not definitely seen previously although there is more diffuse nodular hypermetabolism in the sacrum on the prior study. Incidental  CT findings: Numerous lucent lesions are seen scattered through the thoracolumbar spine and bony pelvis compatible with the history of multiple myeloma. IMPRESSION: 1. Interval marked response to therapy with resolution of the diffuse nodular hypermetabolic disease seen throughout osseous anatomy on the  previous study. 2. Focal small area of hypermetabolism in the posterior cortex of the upper sacrum, not definitely seen previously although there was more diffuse nodular hypermetabolism in the sacrum on the prior study. No underlying lesion on CT imaging. Finding is nonspecific but warrants attention on follow-up imaging. 3. Interval progression of simple appearing cyst in the left adnexal space measuring 5.8 cm today compared to 5.3 cm previously. No associated hypermetabolism. Likely benign, pelvic ultrasound recommended to further evaluate. 4.  Aortic Atherosclerosis (ICD10-I70.0). Electronically Signed   By: Camellia Candle M.D.   On: 10/16/2023 06:40    Impression/Plan: Stage II multiple myeloma IgG subtype diagnosed in April 2024 on systemic treatment now with a symptomatic right scapula lesion; s/p palliative radiation completed on 08/11/2023.  We personally reviewed this patient's most recent imaging which demonstrates an interval response to therapy with no obvious signs of disease progression or explanation of patient's new pain. ***    On date of service, in total, we spent 60 minutes on this encounter. Patient was seen in person.  _____________________________________    Leeroy Due, PA-C   Lauraine Golden, MD    [facility]  Radiation Oncology Direct Dial: [phone]  Fax: [phone] [email or web address]   This document serves as a record of services personally performed by Lauraine Golden, MD and Leeroy Due, PA-C. It was created on her behalf by Reymundo Cartwright, a trained medical scribe. The creation of this record is based on the scribe's personal observations and the provider's statements to them. This document has been checked and approved by the attending provider.

## 2023-10-21 ENCOUNTER — Ambulatory Visit
Admission: RE | Admit: 2023-10-21 | Discharge: 2023-10-21 | Disposition: A | Discharge status: Home / Self Care | Source: Ambulatory Visit | Attending: Radiation Oncology | Admitting: Radiation Oncology

## 2023-10-21 ENCOUNTER — Inpatient Hospital Stay (HOSPITAL_BASED_OUTPATIENT_CLINIC_OR_DEPARTMENT_OTHER): Admitting: Nurse Practitioner

## 2023-10-21 ENCOUNTER — Encounter: Payer: Self-pay | Admitting: Nurse Practitioner

## 2023-10-21 VITALS — BP 161/52 | HR 65 | Temp 97.5°F | Resp 20 | Ht 59.8 in | Wt 134.2 lb

## 2023-10-21 DIAGNOSIS — C37 Malignant neoplasm of thymus: Secondary | ICD-10-CM | POA: Diagnosis not present

## 2023-10-21 DIAGNOSIS — R530 Neoplastic (malignant) related fatigue: Secondary | ICD-10-CM

## 2023-10-21 DIAGNOSIS — Z515 Encounter for palliative care: Secondary | ICD-10-CM

## 2023-10-21 DIAGNOSIS — R630 Anorexia: Secondary | ICD-10-CM | POA: Diagnosis not present

## 2023-10-21 DIAGNOSIS — C9000 Multiple myeloma not having achieved remission: Secondary | ICD-10-CM | POA: Insufficient documentation

## 2023-10-21 DIAGNOSIS — G893 Neoplasm related pain (acute) (chronic): Secondary | ICD-10-CM | POA: Diagnosis not present

## 2023-10-21 NOTE — Progress Notes (Signed)
 Palliative Medicine [facility]  Telephone:([phone] Fax:(336) [phone]   Name: [name] Date: 10/21/2023 MRN: [id number]  DOB: 1941/02/25  Patient Care Team: Dwight Trula SQUIBB, MD as PCP - General (Internal Medicine) Shlomo Wilbert SAUNDERS, MD as PCP - Cardiology (Cardiology) Shlomo Wilbert SAUNDERS, MD as Consulting Physician (Cardiology) Pickenpack-Cousar, Fannie SAILOR, NP as Nurse Practitioner (Hospice and Palliative Medicine)    INTERVAL HISTORY: [name] is a 83 y.o. 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 Palliative ask to see for symptom management and goals of care.   SOCIAL HISTORY:     reports that she has never smoked. She has never used smokeless tobacco. She reports that she does not drink alcohol  and does not use drugs.  ADVANCE DIRECTIVES:  ON FILE   CODE STATUS: DNR  PAST 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 by echo 10/2021   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/2015   Carotid artery stenosis    < 50% stenosis bilaterally by doppler 07/2016   Coronary artery disease 2008   Coronary Ca score of 331 with minimal multivessel plaque < 25% stenosis by coronary CTA 8/23   Heart murmur    per pt   Hypercholesteremia    Hypertension    Obesity    Osteopenia    declines treatment   Pneumonia 1995   Pulmonary HTN (HCC)    mild to moderate by echo 7/23 with PASP   Shoulder pain    Due to arthritis   Vitamin D insufficiency     ALLERGIES:  is allergic to crestor  [rosuvastatin  calcium ], lipitor [atorvastatin], pravastatin, simvastatin, tramadol , zetia   [ezetimibe ], codeine, penicillins, sulfa antibiotics, and vancomycin .  MEDICATIONS:  Current Outpatient Medications  Medication Sig Dispense Refill   acetaminophen  (TYLENOL ) 325 MG tablet Take 2 tablets (650 mg total) by mouth every 8 (eight) hours as needed.     acyclovir  (ZOVIRAX ) 200 MG capsule Take 1 capsule (200 mg total) by mouth 2 (two) times daily. (Patient not taking: Reported on 10/21/2023) 180 capsule 0   allopurinol  (ZYLOPRIM ) 100 MG tablet Take 1 tablet by mouth twice daily (Patient not taking: Reported on 10/21/2023) 60 tablet 0   colestipol (COLESTID) 1 g tablet Take 1 g by mouth 2 (two) times daily.     daratumumab -hyaluronidase -fihj (DARZALEX  FASPRO) 1800-30000 MG-UT/15ML SOLN Inject 1,800 mg into the skin once. (Patient not taking: Reported on 10/21/2023)     dexamethasone  (DECADRON ) 4 MG tablet Please take 5 tablets (20 mg) weekly on the days of treatment (Patient not taking: Reported on 10/21/2023) 40 tablet 3   lenalidomide  (REVLIMID ) 15 MG capsule Take 1 capsule (15 mg total) by mouth daily. [employer] Auth # [id number]     Date Obtained 07/31/2023. Adult female not of childbearing potential (Patient not taking: Reported on 10/21/2023) 21 capsule 0   lidocaine  (LIDODERM ) 5 % Place 1 patch onto the skin daily. Remove & Discard patch within 12 hours or as directed by MD 30 patch 3   losartan  (COZAAR ) 25 MG tablet Take 1 tablet by  mouth once daily 30 tablet 8   Multiple Vitamin (MULTIVITAMIN WITH MINERALS) TABS tablet Take 1 tablet by mouth daily.     omeprazole  (PRILOSEC) 20 MG capsule Take 1 capsule by mouth once daily (Patient not taking: Reported on 10/21/2023) 30 capsule 0   potassium chloride  SA (KLOR-CON  M) 20 MEQ tablet Take 1 tablet (20 mEq total) by mouth daily. (Patient not taking: Reported on 10/21/2023) 7 tablet 0   No current facility-administered medications for this visit.    VITAL SIGNS: There were no vitals taken for this visit. There were no vitals filed for this  visit.  Estimated body mass index is 26.38 kg/m as calculated from the following:   Height as of an earlier encounter on 10/21/23: 4' 11.8 (1.519 m).   Weight as of an earlier encounter on 10/21/23: 134 lb 3.2 oz (60.9 kg).   PERFORMANCE STATUS (ECOG) : 1 - Symptomatic but completely ambulatory   Physical Exam General: NAD Cardiovascular: regular rate and rhythm Pulmonary: normal breathing pattern Extremities: no edema, no joint deformities Skin: no rashes Neurological: AAO x3  IMPRESSION: Discussed the use of AI scribe software for clinical note transcription with the patient, who gave verbal consent to proceed.  History of Present Illness [name] is an 83 year old female who presents for follow-up regarding symptom management. She is accompanied by her son Tanda. Denies concerns with nausea, vomiting, constipation, or diarrhea.  Occasional fatigue.  Her appetite is 'pretty good', but she wants to eat less than before, reporting some signs of early satiety. Her weight is stable at 134 pounds.  Her pain level remains consistent with previous visits. She manages her pain with Tylenol  as needed and a lidocaine  patch applied inconsistently. The lidocaine  patch provides some relief, but without it, the pain returns to its initial severity.  Ms. Pitz experiences more discomfort during the day when she is active, but can usually sleep for an hour or two without being bothered by the pain.  She recently had a follow-up appointment with radiation oncology, where she was told that her pain does not appear to be cancer-related based on their findings. However, the cause of the pain remains unexplained.  We discussed her pain management at length.  I encouraged patient to consistently utilize lidocaine  patch regularly given her reports that it provides relief.  Suggested patient use lidocaine  patch for 12 hours during the day and remove at night in addition to taking 2 extra strength  Tylenol  before bedtime.  She is aware she is able to take additional Tylenol  throughout the day every 8 hours as needed for her pain.  Patient states she does not like to take medications if at all possible.  We discussed the goal of attempting to manage her pain affording her and improvement in her quality of life allow her to do some of the things that she enjoys doing without discomfort.  She and son verbalized understanding and appreciation.  We will continue to closely monitor and support as needed.  All questions answered.  Assessment & Plan Cancer Recent follow-up with radiation oncology indicates current pain is not related to cancer recurrence. Recent scans show no new cancer-related issues. - Coordinate lab work and follow-up appointments to coincide with oncology visits.  Chronic pain Chronic pain with no clear etiology, not cancer-related. Pain is consistent, primarily during the day. Current management includes Tylenol  and lidocaine  patches, though adherence is inconsistent. She wishes to avoid medication but acknowledges the need for pain  control to maintain quality of life. - Encourage consistent use of lidocaine  patch during the day and Tylenol  at night. - Apply lidocaine  patch in the morning and remove it before bed. - Take Tylenol  at bedtime to manage overnight pain. - Reassess pain management regimen at the next appointment.  Patient expressed understanding and was in agreement with this plan. She also understands that She can call the clinic at any time with any questions, concerns, or complaints.   Any controlled substances utilized were prescribed in the context of palliative care. PDMP has been reviewed.   Visit consisted of counseling and education dealing with the complex and emotionally intense issues of symptom management and palliative care in the setting of serious and potentially life-threatening illness.  Levon Borer, AGPCNP-BC  Palliative Medicine  Team/[facility]

## 2023-10-21 NOTE — Progress Notes (Signed)
 Patient follow-up for a diagnosis of: Multiple Myeloma not having achieved remission   Location-   Right Scapula    They completed their radiation on: 08/11/2023   Patient is doing well post radiation treatment   Does the patient complain of any of the following: Chest pains/ SOB: Patient reports 7/10,  upper back pain on the right side   Headaches/ Dizziness: Denies Post radiation skin issues Denies Joint pain/ Swelling: Denies Motor function issues: Denies Range of motion limitations: Challenging getting out of the couch initially.  Fatigue post radiation: Yes, patient does complain of fatigue.  Appetite good/fair/poor: Appetite is good   Additional comments if applicable:  None   Encouraged patient to keep up with their appointments and call the office for any questions or concerns   BP (!) 161/52 (BP Location: Right Wrist, Patient Position: Sitting, Cuff Size: Normal)   Pulse 65   Temp (!) 97.5 F (36.4 C)   Resp 20   Ht 4' 11.8 (1.519 m)   Wt 134 lb 3.2 oz (60.9 kg)   SpO2 98%   BMI 26.38 kg/m

## 2023-10-27 ENCOUNTER — Encounter

## 2023-11-06 ENCOUNTER — Other Ambulatory Visit: Payer: Self-pay | Admitting: Internal Medicine

## 2023-11-06 DIAGNOSIS — R911 Solitary pulmonary nodule: Secondary | ICD-10-CM

## 2023-11-12 ENCOUNTER — Other Ambulatory Visit

## 2023-11-12 ENCOUNTER — Other Ambulatory Visit: Payer: Self-pay

## 2023-11-12 ENCOUNTER — Ambulatory Visit

## 2023-11-12 ENCOUNTER — Ambulatory Visit: Admitting: Physician Assistant

## 2023-11-12 MED ORDER — LOSARTAN POTASSIUM 25 MG PO TABS: 25.0000 mg | ORAL_TABLET | Freq: Every day | ORAL | 1 refills | Status: DC

## 2023-11-17 ENCOUNTER — Other Ambulatory Visit: Payer: Self-pay

## 2023-11-17 MED ORDER — LIDOCAINE 5 % EX PTCH
1.0000 | MEDICATED_PATCH | CUTANEOUS | 3 refills | Status: AC
Start: 2023-11-17 — End: ?

## 2023-11-19 ENCOUNTER — Inpatient Hospital Stay: Attending: Internal Medicine

## 2023-11-19 DIAGNOSIS — C9000 Multiple myeloma not having achieved remission: Secondary | ICD-10-CM | POA: Insufficient documentation

## 2023-11-19 LAB — CBC WITH DIFFERENTIAL (CANCER CENTER ONLY)
Abs Immature Granulocytes: 0.03 K/uL (ref 0.00–0.07)
Basophils Absolute: 0 K/uL (ref 0.0–0.1)
Basophils Relative: 1 %
Eosinophils Absolute: 0.1 K/uL (ref 0.0–0.5)
Eosinophils Relative: 1 %
HCT: 36.3 % (ref 36.0–46.0)
Hemoglobin: 12.2 g/dL (ref 12.0–15.0)
Immature Granulocytes: 0 %
Lymphocytes Relative: 20 %
Lymphs Abs: 1.5 K/uL (ref 0.7–4.0)
MCH: 31.7 pg (ref 26.0–34.0)
MCHC: 33.6 g/dL (ref 30.0–36.0)
MCV: 94.3 fL (ref 80.0–100.0)
Monocytes Absolute: 0.7 K/uL (ref 0.1–1.0)
Monocytes Relative: 9 %
Neutro Abs: 5.2 K/uL (ref 1.7–7.7)
Neutrophils Relative %: 69 %
Platelet Count: 146 K/uL — ABNORMAL LOW (ref 150–400)
RBC: 3.85 MIL/uL — ABNORMAL LOW (ref 3.87–5.11)
RDW: 13.2 % (ref 11.5–15.5)
WBC Count: 7.5 K/uL (ref 4.0–10.5)
nRBC: 0 % (ref 0.0–0.2)

## 2023-11-19 LAB — CMP (CANCER CENTER ONLY)
ALT: 33 U/L (ref 0–44)
AST: 25 U/L (ref 15–41)
Albumin: 4 g/dL (ref 3.5–5.0)
Alkaline Phosphatase: 78 U/L (ref 38–126)
Anion gap: 5 (ref 5–15)
BUN: 17 mg/dL (ref 8–23)
CO2: 33 mmol/L — ABNORMAL HIGH (ref 22–32)
Calcium: 9.2 mg/dL (ref 8.9–10.3)
Chloride: 107 mmol/L (ref 98–111)
Creatinine: 0.78 mg/dL (ref 0.44–1.00)
GFR, Estimated: >60 mL/min (ref >60)
Glucose, Bld: 112 mg/dL — ABNORMAL HIGH (ref 70–99)
Potassium: 3.4 mmol/L — ABNORMAL LOW (ref 3.5–5.1)
Sodium: 145 mmol/L (ref 135–145)
Total Bilirubin: 0.4 mg/dL (ref 0.0–1.2)
Total Protein: 5.5 g/dL — ABNORMAL LOW (ref 6.5–8.1)

## 2023-11-19 LAB — LACTATE DEHYDROGENASE: LDH: 154 U/L (ref 98–192)

## 2023-11-20 ENCOUNTER — Other Ambulatory Visit

## 2023-11-20 ENCOUNTER — Inpatient Hospital Stay

## 2023-11-20 LAB — KAPPA/LAMBDA LIGHT CHAINS
Kappa free light chain: 7.5 mg/L (ref 3.3–19.4)
Kappa, lambda light chain ratio: 2.14 — ABNORMAL HIGH (ref 0.26–1.65)
Lambda free light chains: 3.5 mg/L — ABNORMAL LOW (ref 5.7–26.3)

## 2023-11-20 LAB — IGG, IGA, IGM
IgA: 25 mg/dL — ABNORMAL LOW (ref 64–422)
IgG (Immunoglobin G), Serum: 242 mg/dL — ABNORMAL LOW (ref 586–1602)
IgM (Immunoglobulin M), Srm: 12 mg/dL — ABNORMAL LOW (ref 26–217)

## 2023-11-20 LAB — BETA 2 MICROGLOBULIN, SERUM: Beta-2 Microglobulin: 2.2 mg/L (ref 0.6–2.4)

## 2023-11-21 ENCOUNTER — Encounter: Payer: Self-pay | Admitting: Internal Medicine

## 2023-11-22 ENCOUNTER — Other Ambulatory Visit: Payer: Self-pay | Admitting: Internal Medicine

## 2023-11-22 DIAGNOSIS — C9000 Multiple myeloma not having achieved remission: Secondary | ICD-10-CM

## 2023-11-23 ENCOUNTER — Encounter: Payer: Self-pay | Admitting: Internal Medicine

## 2023-11-27 ENCOUNTER — Inpatient Hospital Stay (HOSPITAL_BASED_OUTPATIENT_CLINIC_OR_DEPARTMENT_OTHER): Admitting: Internal Medicine

## 2023-11-27 ENCOUNTER — Inpatient Hospital Stay (HOSPITAL_BASED_OUTPATIENT_CLINIC_OR_DEPARTMENT_OTHER): Admitting: Nurse Practitioner

## 2023-11-27 ENCOUNTER — Other Ambulatory Visit

## 2023-11-27 VITALS — BP 137/76

## 2023-11-27 VITALS — BP 170/66 | HR 60 | Temp 97.6°F | Resp 18 | Ht 59.8 in | Wt 137.4 lb

## 2023-11-27 DIAGNOSIS — G893 Neoplasm related pain (acute) (chronic): Secondary | ICD-10-CM | POA: Diagnosis not present

## 2023-11-27 DIAGNOSIS — C9000 Multiple myeloma not having achieved remission: Secondary | ICD-10-CM

## 2023-11-27 DIAGNOSIS — R530 Neoplastic (malignant) related fatigue: Secondary | ICD-10-CM | POA: Diagnosis not present

## 2023-11-27 DIAGNOSIS — Z515 Encounter for palliative care: Secondary | ICD-10-CM

## 2023-11-27 NOTE — Progress Notes (Signed)
 "     [facility] Telephone:(336) [phone]   Fax:(336) [phone]  OFFICE PROGRESS NOTE  [name] Trula SQUIBB, MD 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 Kerrin on January 28, 2022.    CURRENT THERAPY:  1) Starting systemic therapy with Revlimid  21 days on 7 days off, Darzalex , and 20 mg of Decadron  weekly.  First dose expected next week on 09/11/2022.  Status post 11 cycles.  2) Monthly Zometa  injections for 6 to 12 months, followed by every 3 months thereon after once dental clearance is obtained.  INTERVAL HISTORY: [name] 83 y.o. female returns to the clinic today for follow-up visit accompanied by her daughter-in-law. Discussed the use of AI scribe software for clinical note transcription with the patient, who gave verbal consent to proceed.  History of Present Illness An 83 year old female with multiple myeloma who presents for routine follow-up during her observation period post-treatment. She is accompanied by her daughter-in-law.  She has a history of multiple myeloma diagnosed in May 2024. She completed twelve cycles of treatment with Revlimid , daratumumab , and Decadron , and is currently in an observation period. Her recent myeloma panel was performed, and she recalls being told that her protein levels, anemia status, and kidney function were reviewed, with a serum creatinine of 0.78.  She feels 'fair to middling' and notes that while she feels good in the mornings, she becomes tired after preparing breakfast and needs to rest. She is not currently working.    MEDICAL HISTORY: Past Medical History:  Diagnosis Date    Abnormal vaginal Pap smear 1994   annual paps for years after that.more recently every other year,last in 2012-we agreed not to do them anymore   Anxiety    no rx   Aortic stenosis    s/p AVR with bioprosthesis   Ascending aorta dilatation (HCC)    40mm by echo 10/2021   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/2015   Carotid artery stenosis    < 50% stenosis bilaterally by doppler 07/2016   Coronary artery disease 2008   Coronary Ca score of 331 with minimal multivessel plaque < 25% stenosis by coronary CTA 8/23   Heart murmur    per pt   Hypercholesteremia    Hypertension    Obesity    Osteopenia    declines treatment   Pneumonia 1995   Pulmonary HTN (HCC)    mild to moderate by echo 7/23 with PASP   Shoulder pain    Due to arthritis   Vitamin D insufficiency     ALLERGIES:  is allergic to crestor  [rosuvastatin  calcium ], lipitor [atorvastatin], pravastatin, simvastatin, tramadol , zetia  [ezetimibe ], codeine, penicillins, sulfa antibiotics, and vancomycin .  MEDICATIONS:  Current Outpatient Medications  Medication Sig Dispense Refill   acetaminophen  (TYLENOL ) 325 MG tablet Take 2 tablets (650 mg total) by mouth every 8 (eight) hours as needed.     acyclovir  (ZOVIRAX ) 200 MG capsule Take 1 capsule (200 mg total) by mouth 2 (two) times daily. (Patient not taking: Reported on 10/21/2023) 180 capsule 0   allopurinol  (ZYLOPRIM ) 100 MG tablet Take 1 tablet by mouth twice daily (Patient not taking: Reported  on 10/21/2023) 60 tablet 0   colestipol (COLESTID) 1 g tablet Take 1 g by mouth 2 (two) times daily.     daratumumab -hyaluronidase -fihj (DARZALEX  FASPRO) 1800-30000 MG-UT/15ML SOLN Inject 1,800 mg into the skin once. (Patient not taking: Reported on 10/21/2023)     dexamethasone  (DECADRON ) 4 MG tablet Please take 5 tablets (20 mg) weekly on the days of treatment (Patient not taking: Reported on 10/21/2023) 40 tablet 3   lenalidomide  (REVLIMID ) 15 MG capsule Take 1 capsule (15 mg total) by mouth daily.  [employer] Auth # [id number]     Date Obtained 07/31/2023. Adult female not of childbearing potential (Patient not taking: Reported on 10/21/2023) 21 capsule 0   lidocaine  (LIDODERM ) 5 % Place 1 patch onto the skin daily. Remove & Discard patch within 12 hours or as directed by MD 30 patch 3   losartan  (COZAAR ) 25 MG tablet Take 1 tablet (25 mg total) by mouth daily. 90 tablet 1   Multiple Vitamin (MULTIVITAMIN WITH MINERALS) TABS tablet Take 1 tablet by mouth daily.     omeprazole  (PRILOSEC) 20 MG capsule Take 1 capsule by mouth once daily 30 capsule 0   potassium chloride  SA (KLOR-CON  M) 20 MEQ tablet Take 1 tablet (20 mEq total) by mouth daily. (Patient not taking: Reported on 10/21/2023) 7 tablet 0   No current facility-administered medications for this visit.    SURGICAL HISTORY:  Past Surgical History:  Procedure Laterality Date   AORTIC VALVE REPLACEMENT N/A 10/14/2013   Procedure: AORTIC VALVE REPLACEMENT (AVR);  Surgeon: Dorise MARLA Fellers, MD;  Location: [facility] OR;  Service: Open Heart Surgery;  Laterality: N/A;   CARDIAC CATHETERIZATION     CATARACT EXTRACTION, BILATERAL     CHOLECYSTECTOMY  04/08/1988   ESOPHAGOGASTRODUODENOSCOPY (EGD) WITH PROPOFOL  N/A 09/05/2022   Procedure: ESOPHAGOGASTRODUODENOSCOPY (EGD) WITH PROPOFOL ;  Surgeon: Elicia Claw, MD;  Location: [facility] ENDOSCOPY;  Service: Gastroenterology;  Laterality: N/A;   INTRAOPERATIVE TRANSESOPHAGEAL ECHOCARDIOGRAM N/A 10/14/2013   Procedure: INTRAOPERATIVE TRANSESOPHAGEAL ECHOCARDIOGRAM;  Surgeon: Dorise MARLA Fellers, MD;  Location: [facility] OR;  Service: Open Heart Surgery;  Laterality: N/A;   IVC FILTER INSERTION N/A 09/06/2022   Procedure: IVC FILTER INSERTION;  Surgeon: Magda Debby SAILOR, MD;  Location: [facility] INVASIVE CV LAB;  Service: Cardiovascular;  Laterality: N/A;   KYPHOPLASTY Bilateral 03/27/2022   Procedure: KYPHOPLASTY AND BIOPSY THORACIC ELEVEN;  Surgeon: Lanis Pupa, MD;  Location: [facility] OR;  Service: Neurosurgery;  Laterality:  Bilateral;   LEFT AND RIGHT HEART CATHETERIZATION WITH CORONARY ANGIOGRAM N/A 09/16/2013   Procedure: LEFT AND RIGHT HEART CATHETERIZATION WITH CORONARY ANGIOGRAM;  Surgeon: Wilbert JONELLE Bihari, MD;  Location: [facility] CATH LAB;  Service: Cardiovascular;  Laterality: N/A;   TONSILLECTOMY      REVIEW OF SYSTEMS:  A comprehensive review of systems was negative except for: Constitutional: positive for fatigue   PHYSICAL EXAMINATION: General appearance: alert, cooperative, fatigued, and no distress Head: Normocephalic, without obvious abnormality, atraumatic Neck: no adenopathy, no JVD, supple, symmetrical, trachea midline, and thyroid  not enlarged, symmetric, no tenderness/mass/nodules Lymph nodes: Cervical, supraclavicular, and axillary nodes normal. Resp: clear to auscultation bilaterally Back: symmetric, no curvature. ROM normal. No CVA tenderness. Cardio: regular rate and rhythm, S1, S2 normal, no murmur, click, rub or gallop GI: soft, non-tender; bowel sounds normal; no masses,  no organomegaly Extremities: extremities normal, atraumatic, no cyanosis or edema  ECOG PERFORMANCE STATUS: 1 - Symptomatic but completely ambulatory  Blood pressure 137/76.  LABORATORY DATA: Lab Results  Component Value Date   WBC 7.5  11/19/2023   HGB 12.2 11/19/2023   HCT 36.3 11/19/2023   MCV 94.3 11/19/2023   PLT 146 (L) 11/19/2023      Chemistry      Component Value Date/Time   NA 145 11/19/2023 1441   NA 145 (H) 02/27/2023 1140   K 3.4 (L) 11/19/2023 1441   CL 107 11/19/2023 1441   CO2 33 (H) 11/19/2023 1441   BUN 17 11/19/2023 1441   BUN 22 02/27/2023 1140   CREATININE 0.78 11/19/2023 1441   CREATININE 0.85 01/29/2016 0910      Component Value Date/Time   CALCIUM  9.2 11/19/2023 1441   ALKPHOS 78 11/19/2023 1441   AST 25 11/19/2023 1441   ALT 33 11/19/2023 1441   BILITOT 0.4 11/19/2023 1441       RADIOGRAPHIC STUDIES: No results found.   ASSESSMENT AND PLAN: This is a very pleasant 83  years old white female with  1) history of stage I (T1a, N0, M0) thymoma type AB diagnosed in October 2023 status post resection under the care of Dr. Kerrin on January 28, 2022 with close resection margin and microscopic infiltration of the capsule. 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  The patient is undergoing systemic therapy with daratumumab , Revlimid  15 mg p.o. daily for 21 days every 4 weeks in addition to Decadron  20 mg weekly with the treatment.  Status post 12 cycles.  She tolerated her treatment fairly well.  She is currently on observation. She had repeat myeloma panel that showed no concerning findings for progression. Assessment and Plan Assessment & Plan Multiple myeloma, post-treatment, under observation Multiple myeloma diagnosed in May 2024, status post 12 cycles of treatment with Revlimid , daratumumab , and Decadron . Currently in an observation period. Recent myeloma panel shows well-managed disease with no increase in protein levels. No anemia or renal impairment, with serum creatinine at 0.78. Reports fatigue but remains active within her limits. - Continue observation for 3 months - Repeat myeloma panel and protein studies in 3 months - Advise to stay active and exercise as tolerated - Instruct to contact the office if any issues arise For the bone disease, she will continue her treatment with Zometa  but I will change the frequency to every 12 weeks. She was advised to call immediately if she has any other concerning symptoms in the interval. The patient voices understanding of current disease status and treatment options and is in agreement with the current care plan.  All questions were answered. The patient knows to call the clinic with any problems, questions or concerns. We can certainly see the patient much sooner if necessary.  The total time spent in the appointment was 20 minutes.  Disclaimer: This note was dictated with voice  recognition software. Similar sounding words can inadvertently be transcribed and may not be corrected upon review.        "

## 2023-11-28 ENCOUNTER — Encounter: Payer: Self-pay | Admitting: Internal Medicine

## 2023-11-29 ENCOUNTER — Encounter: Payer: Self-pay | Admitting: Internal Medicine

## 2023-11-29 ENCOUNTER — Encounter: Payer: Self-pay | Admitting: Nurse Practitioner

## 2023-11-29 NOTE — Progress Notes (Signed)
 Palliative Medicine [facility]  [phone] [phone]   Name: [name] Date: 11/29/2023 MRN: [id number]  DOB: [birth year]  Patient Care Team: Dwight Trula SQUIBB, MD as PCP - General (Internal Medicine) Shlomo Wilbert SAUNDERS, MD as PCP - Cardiology (Cardiology) Shlomo Wilbert SAUNDERS, MD as Consulting Physician (Cardiology) Pickenpack-Cousar, Fannie SAILOR, NP as Nurse Practitioner (Hospice and Palliative Medicine)    INTERVAL HISTORY: [name] is a 83 y.o. 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 Palliative ask to see for symptom management and goals of care.   SOCIAL HISTORY:     reports that she has never smoked. She has never used smokeless tobacco. She reports that she does not drink alcohol  and does not use drugs.  ADVANCE DIRECTIVES:  ON FILE   CODE STATUS: DNR  PAST 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 by echo 10/2021   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/2015   Carotid artery stenosis    < 50% stenosis bilaterally by doppler 07/2016   Coronary artery disease 2008   Coronary Ca score of 331 with minimal multivessel plaque < 25% stenosis by coronary CTA 8/23   Heart murmur    per pt   Hypercholesteremia    Hypertension    Obesity    Osteopenia    declines treatment   Pneumonia 1995   Pulmonary HTN (HCC)    mild to moderate by echo 7/23 with PASP   Shoulder pain    Due to arthritis   Vitamin D insufficiency     ALLERGIES:  is allergic to crestor  [rosuvastatin  calcium ], lipitor [atorvastatin], pravastatin, simvastatin, tramadol , zetia   [ezetimibe ], codeine, penicillins, sulfa antibiotics, and vancomycin .  MEDICATIONS:  Current Outpatient Medications  Medication Sig Dispense Refill   acetaminophen  (TYLENOL ) 325 MG tablet Take 2 tablets (650 mg total) by mouth every 8 (eight) hours as needed.     acyclovir  (ZOVIRAX ) 200 MG capsule Take 1 capsule (200 mg total) by mouth 2 (two) times daily. (Patient not taking: Reported on 10/21/2023) 180 capsule 0   allopurinol  (ZYLOPRIM ) 100 MG tablet Take 1 tablet by mouth twice daily (Patient not taking: Reported on 10/21/2023) 60 tablet 0   colestipol (COLESTID) 1 g tablet Take 1 g by mouth 2 (two) times daily.     daratumumab -hyaluronidase -fihj (DARZALEX  FASPRO) 1800-30000 MG-UT/15ML SOLN Inject 1,800 mg into the skin once. (Patient not taking: Reported on 10/21/2023)     dexamethasone  (DECADRON ) 4 MG tablet Please take 5 tablets (20 mg) weekly on the days of treatment (Patient not taking: Reported on 10/21/2023) 40 tablet 3   lenalidomide  (REVLIMID ) 15 MG capsule Take 1 capsule (15 mg total) by mouth daily. [employer] Auth # [id number]     Date Obtained 07/31/2023. Adult female not of childbearing potential (Patient not taking: Reported on 10/21/2023) 21 capsule 0   lidocaine  (LIDODERM ) 5 % Place 1 patch onto the skin daily. Remove & Discard patch within 12 hours or as directed by MD 30 patch 3   losartan  (COZAAR ) 25 MG tablet Take 1 tablet (25  mg total) by mouth daily. 90 tablet 1   Multiple Vitamin (MULTIVITAMIN WITH MINERALS) TABS tablet Take 1 tablet by mouth daily.     omeprazole  (PRILOSEC) 20 MG capsule Take 1 capsule by mouth once daily 30 capsule 0   potassium chloride  SA (KLOR-CON  M) 20 MEQ tablet Take 1 tablet (20 mEq total) by mouth daily. (Patient not taking: Reported on 10/21/2023) 7 tablet 0   No current facility-administered medications for this visit.    VITAL SIGNS: BP (!) 170/66 (BP Location: Right Arm, Patient Position: Sitting) Comment: mannual  Pulse 60   Temp 97.6 F (36.4  C) (Temporal)   Resp 18   Ht 4' 11.8 (1.519 m)   Wt 137 lb 6.4 oz (62.3 kg)   SpO2 97%   BMI 27.01 kg/m  Filed Weights   11/27/23 1502  Weight: 137 lb 6.4 oz (62.3 kg)    Estimated body mass index is 27.01 kg/m as calculated from the following:   Height as of this encounter: 4' 11.8 (1.519 m).   Weight as of this encounter: 137 lb 6.4 oz (62.3 kg).   PERFORMANCE STATUS (ECOG) : 1 - Symptomatic but completely ambulatory   Physical Exam General: NAD Cardiovascular: regular rate and rhythm Pulmonary: normal breathing pattern Extremities: no edema, no joint deformities Skin: no rashes Neurological: AAO x3  IMPRESSION: Discussed the use of AI scribe software for clinical note transcription with the patient, who gave verbal consent to proceed.  History of Present Illness [name] is an 83 year old female who presents for follow-up regarding symptom management. She is accompanied by family.   Her appetite is reportedly good, and she has gained some weight since her last visit. Current weight is up to 137lbs. No issues with constipation or diarrhea. Her sleep is generally adequate, although she wakes up a few times during the night but can usually return to sleep quickly. She describes her energy levels as variable, feeling good in the morning but needing to take breaks after completing tasks like preparing breakfast.  She is currently using lidocaine  patches, which provide some relief, and takes Tylenol . She has not tried topical muscle rubs like Voltaren or Biofreeze. At this time she would not like to consider stronger pain medication. Recommended use of topical creams for additional pain support.   We will continue to closely monitor and support.   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 Pain affects mobility upon standing. Current management includes  lidocaine  patches and acetaminophen , providing some relief. She is not interested in stronger analgesics at this time. - Continue lidocaine  patches for pain management. - Continue acetaminophen  as needed for pain. - Recommend over-the-counter topical muscle rubs such as Voltaren or Biofreeze for application to the joints several times a day.  Patient expressed understanding and was in agreement with this plan. She also understands that She can call the clinic at any time with any questions, concerns, or complaints.   Any controlled substances utilized were prescribed in the context of palliative care. PDMP has been reviewed.   Visit consisted of counseling and education dealing with the complex and emotionally intense issues of symptom management and palliative care in the setting of serious and potentially life-threatening illness.  Levon Borer, AGPCNP-BC  Palliative Medicine Team/[facility]

## 2024-01-12 ENCOUNTER — Inpatient Hospital Stay: Admitting: Nurse Practitioner

## 2024-01-22 ENCOUNTER — Telehealth: Payer: Self-pay | Admitting: Medical Oncology

## 2024-01-22 DIAGNOSIS — M898X9 Other specified disorders of bone, unspecified site: Secondary | ICD-10-CM

## 2024-01-22 NOTE — Telephone Encounter (Signed)
 Son reported that [name] is having more bilateral hip pain more than normal and is using her walker now . He said tylenol  and lidocaine  patch help some. She is limping now. Per Dr. Sherrod he is ordering a MRI of pelvis.

## 2024-01-26 ENCOUNTER — Inpatient Hospital Stay: Attending: Internal Medicine | Admitting: Nurse Practitioner

## 2024-01-26 ENCOUNTER — Ambulatory Visit (HOSPITAL_COMMUNITY)
Admission: RE | Admit: 2024-01-26 | Discharge: 2024-01-26 | Disposition: A | Discharge status: Home / Self Care | Source: Ambulatory Visit | Attending: Internal Medicine | Admitting: Internal Medicine

## 2024-01-26 DIAGNOSIS — M898X9 Other specified disorders of bone, unspecified site: Secondary | ICD-10-CM | POA: Diagnosis not present

## 2024-01-26 DIAGNOSIS — M25451 Effusion, right hip: Secondary | ICD-10-CM | POA: Diagnosis not present

## 2024-01-26 DIAGNOSIS — S76011A Strain of muscle, fascia and tendon of right hip, initial encounter: Secondary | ICD-10-CM | POA: Diagnosis not present

## 2024-01-26 DIAGNOSIS — C9000 Multiple myeloma not having achieved remission: Secondary | ICD-10-CM | POA: Diagnosis not present

## 2024-01-26 DIAGNOSIS — R9389 Abnormal findings on diagnostic imaging of other specified body structures: Secondary | ICD-10-CM | POA: Diagnosis not present

## 2024-01-26 MED ORDER — GADOBUTROL 1 MMOL/ML IV SOLN
6.0000 mL | Freq: Once | INTRAVENOUS | Status: AC | PRN
  Administered 2024-01-26: 6 mL via INTRAVENOUS

## 2024-01-28 NOTE — Progress Notes (Signed)
 Erroneous

## 2024-01-29 ENCOUNTER — Other Ambulatory Visit: Payer: Self-pay

## 2024-01-29 ENCOUNTER — Telehealth: Payer: Self-pay

## 2024-01-29 DIAGNOSIS — R531 Weakness: Secondary | ICD-10-CM

## 2024-01-29 DIAGNOSIS — C9000 Multiple myeloma not having achieved remission: Secondary | ICD-10-CM

## 2024-01-29 NOTE — Telephone Encounter (Signed)
 S/w patient's son regarding recent CT results. Patient and son aware that Dr. Sherrod will be referring her to orthopedic surgery for further consultation and management of CT findings. Per patient and son's request - will send contact information for orthopedic surgery office for their records. Received confirmation of successful fax transmission of referral information.

## 2024-02-18 ENCOUNTER — Inpatient Hospital Stay

## 2024-02-19 ENCOUNTER — Ambulatory Visit (HOSPITAL_COMMUNITY)
Admission: RE | Admit: 2024-02-19 | Discharge: 2024-02-19 | Disposition: A | Discharge status: Home / Self Care | Source: Ambulatory Visit | Attending: Physician Assistant | Admitting: Physician Assistant

## 2024-02-19 ENCOUNTER — Inpatient Hospital Stay: Attending: Internal Medicine

## 2024-02-19 DIAGNOSIS — Z7983 Long term (current) use of bisphosphonates: Secondary | ICD-10-CM | POA: Insufficient documentation

## 2024-02-19 DIAGNOSIS — Z7952 Long term (current) use of systemic steroids: Secondary | ICD-10-CM | POA: Insufficient documentation

## 2024-02-19 DIAGNOSIS — C9000 Multiple myeloma not having achieved remission: Secondary | ICD-10-CM | POA: Diagnosis not present

## 2024-02-19 DIAGNOSIS — C37 Malignant neoplasm of thymus: Secondary | ICD-10-CM | POA: Insufficient documentation

## 2024-02-19 DIAGNOSIS — Z79899 Other long term (current) drug therapy: Secondary | ICD-10-CM | POA: Diagnosis not present

## 2024-02-19 DIAGNOSIS — J984 Other disorders of lung: Secondary | ICD-10-CM | POA: Diagnosis not present

## 2024-02-19 DIAGNOSIS — Z85238 Personal history of other malignant neoplasm of thymus: Secondary | ICD-10-CM | POA: Insufficient documentation

## 2024-02-19 DIAGNOSIS — Z79624 Long term (current) use of inhibitors of nucleotide synthesis: Secondary | ICD-10-CM | POA: Insufficient documentation

## 2024-02-19 DIAGNOSIS — D801 Nonfamilial hypogammaglobulinemia: Secondary | ICD-10-CM | POA: Diagnosis not present

## 2024-02-19 LAB — CBC WITH DIFFERENTIAL (CANCER CENTER ONLY)
Abs Immature Granulocytes: 0.03 K/uL (ref 0.00–0.07)
Basophils Absolute: 0.1 K/uL (ref 0.0–0.1)
Basophils Relative: 1 %
Eosinophils Absolute: 0.1 K/uL (ref 0.0–0.5)
Eosinophils Relative: 1 %
HCT: 37.9 % (ref 36.0–46.0)
Hemoglobin: 12.8 g/dL (ref 12.0–15.0)
Immature Granulocytes: 0 %
Lymphocytes Relative: 18 %
Lymphs Abs: 1.7 K/uL (ref 0.7–4.0)
MCH: 30.9 pg (ref 26.0–34.0)
MCHC: 33.8 g/dL (ref 30.0–36.0)
MCV: 91.5 fL (ref 80.0–100.0)
Monocytes Absolute: 0.8 K/uL (ref 0.1–1.0)
Monocytes Relative: 9 %
Neutro Abs: 6.8 K/uL (ref 1.7–7.7)
Neutrophils Relative %: 71 %
Platelet Count: 159 K/uL (ref 150–400)
RBC: 4.14 MIL/uL (ref 3.87–5.11)
RDW: 13.3 % (ref 11.5–15.5)
WBC Count: 9.5 K/uL (ref 4.0–10.5)
nRBC: 0 % (ref 0.0–0.2)

## 2024-02-19 LAB — CMP (CANCER CENTER ONLY)
ALT: 38 U/L (ref 0–44)
AST: 30 U/L (ref 15–41)
Albumin: 4.1 g/dL (ref 3.5–5.0)
Alkaline Phosphatase: 91 U/L (ref 38–126)
Anion gap: 10 (ref 5–15)
BUN: 23 mg/dL (ref 8–23)
CO2: 27 mmol/L (ref 22–32)
Calcium: 9.9 mg/dL (ref 8.9–10.3)
Chloride: 105 mmol/L (ref 98–111)
Creatinine: 0.7 mg/dL (ref 0.44–1.00)
GFR, Estimated: >60 mL/min (ref >60)
Glucose, Bld: 92 mg/dL (ref 70–99)
Potassium: 4 mmol/L (ref 3.5–5.1)
Sodium: 142 mmol/L (ref 135–145)
Total Bilirubin: 0.3 mg/dL (ref 0.0–1.2)
Total Protein: 5.9 g/dL — ABNORMAL LOW (ref 6.5–8.1)

## 2024-02-19 LAB — LACTATE DEHYDROGENASE: LDH: 191 U/L (ref 105–235)

## 2024-02-19 MED ORDER — SODIUM CHLORIDE (PF) 0.9 % IJ SOLN
INTRAMUSCULAR | Status: AC
  Filled 2024-02-19: qty 50

## 2024-02-19 MED ORDER — IOHEXOL 300 MG/ML  SOLN
75.0000 mL | Freq: Once | INTRAMUSCULAR | Status: AC | PRN
  Administered 2024-02-19: 75 mL via INTRAVENOUS

## 2024-02-20 LAB — KAPPA/LAMBDA LIGHT CHAINS
Kappa free light chain: 4.6 mg/L (ref 3.3–19.4)
Kappa, lambda light chain ratio: 0.84 (ref 0.26–1.65)
Lambda free light chains: 5.5 mg/L — ABNORMAL LOW (ref 5.7–26.3)

## 2024-02-20 LAB — BETA 2 MICROGLOBULIN, SERUM: Beta-2 Microglobulin: 2.6 mg/L — ABNORMAL HIGH (ref 0.6–2.4)

## 2024-02-21 LAB — IGG, IGA, IGM
IgA: 32 mg/dL — ABNORMAL LOW (ref 64–422)
IgG (Immunoglobin G), Serum: 303 mg/dL — ABNORMAL LOW (ref 586–1602)
IgM (Immunoglobulin M), Srm: 28 mg/dL (ref 26–217)

## 2024-02-25 ENCOUNTER — Inpatient Hospital Stay (HOSPITAL_BASED_OUTPATIENT_CLINIC_OR_DEPARTMENT_OTHER): Admitting: Internal Medicine

## 2024-02-25 ENCOUNTER — Inpatient Hospital Stay

## 2024-02-25 VITALS — BP 149/51 | HR 63 | Temp 98.3°F | Resp 17 | Ht 59.0 in | Wt 142.0 lb

## 2024-02-25 DIAGNOSIS — C9000 Multiple myeloma not having achieved remission: Secondary | ICD-10-CM | POA: Diagnosis not present

## 2024-02-25 DIAGNOSIS — Z7983 Long term (current) use of bisphosphonates: Secondary | ICD-10-CM | POA: Diagnosis not present

## 2024-02-25 DIAGNOSIS — C9001 Multiple myeloma in remission: Secondary | ICD-10-CM

## 2024-02-25 NOTE — Progress Notes (Signed)
 [facility] [phone]   [phone]  OFFICE PROGRESS NOTE  [name] Trula SQUIBB, MD 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 Kerrin on January 28, 2022.    CURRENT THERAPY:  1) Starting systemic therapy with Revlimid  21 days on 7 days off, Darzalex , and 20 mg of Decadron  weekly.  First dose expected next week on 09/11/2022.  Status post 11 cycles.  2) Monthly Zometa  injections for 6 to 12 months, followed by every 3 months thereon after once dental clearance is obtained.  INTERVAL HISTORY: [name] 83 y.o. female returns to the clinic today for follow-up visit accompanied by her son, [name]. Discussed the use of AI scribe software for clinical note transcription with the patient, who gave verbal consent to proceed.  History of Present Illness [name] is an 83 year old female with multiple myeloma who presents for evaluation with repeat CT scan of the chest and repeat myeloma panel. She is accompanied by her son, Dale.  Diagnosed with multiple myeloma in May 2025, she has completed eleven cycles of systemic therapy with Revlimid , daratumumab , and Decadron . Currently in an observation period, she receives Zometa  infusions every three months. Her myeloma panel shows IgG levels are low at 303.  She feels 'pretty good' but experiences fatigue after minimal activity. Hemoglobin is 12.8, indicating no anemia. She maintains social connections by meeting with high school classmates monthly for lunch.  She has a history of thymoma and underwent a CT scan of the chest due to shoulder issues, which showed no  significant findings apart from broken ribs.  She experiences hip pain and is scheduled to see an orthopedic doctor next week. An MRI indicated a strained tendon, but the pain has lessened.  She continues to take colestipol for diarrhea, as attempts to discontinue it have been unsuccessful. She also manages high blood pressure.    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 by echo 10/2021   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/2015   Carotid artery stenosis    < 50% stenosis bilaterally by doppler 07/2016   Coronary artery disease 2008   Coronary Ca score of 331 with minimal multivessel plaque < 25% stenosis by coronary CTA 8/23   Heart murmur    per pt   Hypercholesteremia    Hypertension    Obesity    Osteopenia    declines treatment   Pneumonia 1995   Pulmonary HTN (HCC)    mild to moderate by echo 7/23 with PASP   Shoulder pain    Due to arthritis   Vitamin D insufficiency     ALLERGIES:  is allergic to crestor  [rosuvastatin  calcium ], lipitor [atorvastatin], pravastatin, simvastatin, tramadol , zetia  [ezetimibe ], codeine, penicillins, sulfa antibiotics, and vancomycin .  MEDICATIONS:  Current Outpatient Medications  Medication Sig Dispense Refill  acetaminophen  (TYLENOL ) 325 MG tablet Take 2 tablets (650 mg total) by mouth every 8 (eight) hours as needed.     acyclovir  (ZOVIRAX ) 200 MG capsule Take 1 capsule (200 mg total) by mouth 2 (two) times daily. (Patient not taking: Reported on 10/21/2023) 180 capsule 0   allopurinol  (ZYLOPRIM ) 100 MG tablet Take 1 tablet by mouth twice daily (Patient not taking: Reported on 10/21/2023) 60 tablet 0   colestipol (COLESTID) 1 g tablet Take 1 g by mouth 2 (two) times daily.      daratumumab -hyaluronidase -fihj (DARZALEX  FASPRO) 1800-30000 MG-UT/15ML SOLN Inject 1,800 mg into the skin once. (Patient not taking: Reported on 10/21/2023)     dexamethasone  (DECADRON ) 4 MG tablet Please take 5 tablets (20 mg) weekly on the days of treatment (Patient not taking: Reported on 10/21/2023) 40 tablet 3   lenalidomide  (REVLIMID ) 15 MG capsule Take 1 capsule (15 mg total) by mouth daily. [employer] Auth # [id number]     Date Obtained 07/31/2023. Adult female not of childbearing potential (Patient not taking: Reported on 10/21/2023) 21 capsule 0   lidocaine  (LIDODERM ) 5 % Place 1 patch onto the skin daily. Remove & Discard patch within 12 hours or as directed by MD 30 patch 3   losartan  (COZAAR ) 25 MG tablet Take 1 tablet (25 mg total) by mouth daily. 90 tablet 1   Multiple Vitamin (MULTIVITAMIN WITH MINERALS) TABS tablet Take 1 tablet by mouth daily.     omeprazole  (PRILOSEC) 20 MG capsule Take 1 capsule by mouth once daily 30 capsule 0   potassium chloride  SA (KLOR-CON  M) 20 MEQ tablet Take 1 tablet (20 mEq total) by mouth daily. (Patient not taking: Reported on 10/21/2023) 7 tablet 0   No current facility-administered medications for this visit.    SURGICAL HISTORY:  Past Surgical History:  Procedure Laterality Date   AORTIC VALVE REPLACEMENT N/A 10/14/2013   Procedure: AORTIC VALVE REPLACEMENT (AVR);  Surgeon: Dorise MARLA Fellers, MD;  Location: [facility] OR;  Service: Open Heart Surgery;  Laterality: N/A;   CARDIAC CATHETERIZATION     CATARACT EXTRACTION, BILATERAL     CHOLECYSTECTOMY  04/08/1988   ESOPHAGOGASTRODUODENOSCOPY (EGD) WITH PROPOFOL  N/A 09/05/2022   Procedure: ESOPHAGOGASTRODUODENOSCOPY (EGD) WITH PROPOFOL ;  Surgeon: Elicia Claw, MD;  Location: [facility] ENDOSCOPY;  Service: Gastroenterology;  Laterality: N/A;   INTRAOPERATIVE TRANSESOPHAGEAL ECHOCARDIOGRAM N/A 10/14/2013   Procedure: INTRAOPERATIVE TRANSESOPHAGEAL ECHOCARDIOGRAM;  Surgeon: Dorise MARLA Fellers, MD;  Location: [facility] OR;  Service:  Open Heart Surgery;  Laterality: N/A;   IVC FILTER INSERTION N/A 09/06/2022   Procedure: IVC FILTER INSERTION;  Surgeon: Magda Debby SAILOR, MD;  Location: [facility] INVASIVE CV LAB;  Service: Cardiovascular;  Laterality: N/A;   KYPHOPLASTY Bilateral 03/27/2022   Procedure: KYPHOPLASTY AND BIOPSY THORACIC ELEVEN;  Surgeon: Lanis Pupa, MD;  Location: [facility] OR;  Service: Neurosurgery;  Laterality: Bilateral;   LEFT AND RIGHT HEART CATHETERIZATION WITH CORONARY ANGIOGRAM N/A 09/16/2013   Procedure: LEFT AND RIGHT HEART CATHETERIZATION WITH CORONARY ANGIOGRAM;  Surgeon: Wilbert JONELLE Bihari, MD;  Location: [facility] CATH LAB;  Service: Cardiovascular;  Laterality: N/A;   TONSILLECTOMY      REVIEW OF SYSTEMS:  Constitutional: positive for fatigue Eyes: negative Ears, nose, mouth, throat, and face: negative Respiratory: negative Cardiovascular: negative Gastrointestinal: negative Genitourinary:negative Integument/breast: negative Hematologic/lymphatic: negative Musculoskeletal:positive for arthralgias Neurological: negative Behavioral/Psych: negative Endocrine: negative Allergic/Immunologic: negative   PHYSICAL EXAMINATION: General appearance: alert, cooperative, fatigued, and no distress Head: Normocephalic, without obvious abnormality, atraumatic Neck: no adenopathy, no JVD, supple, symmetrical, trachea midline,  and thyroid  not enlarged, symmetric, no tenderness/mass/nodules Lymph nodes: Cervical, supraclavicular, and axillary nodes normal. Resp: clear to auscultation bilaterally Back: symmetric, no curvature. ROM normal. No CVA tenderness. Cardio: regular rate and rhythm, S1, S2 normal, no murmur, click, rub or gallop GI: soft, non-tender; bowel sounds normal; no masses,  no organomegaly Extremities: extremities normal, atraumatic, no cyanosis or edema Neurologic: Alert and oriented X 3, normal strength and tone. Normal symmetric reflexes. Normal coordination and gait  ECOG PERFORMANCE STATUS: 1 -  Symptomatic but completely ambulatory  Blood pressure (!) 149/51, pulse 63, temperature 98.3 F (36.8 C), temperature source Temporal, resp. rate 17, height 4' 11 (1.499 m), weight 142 lb (64.4 kg), SpO2 97%.  LABORATORY DATA: Lab Results  Component Value Date   WBC 9.5 02/19/2024   HGB 12.8 02/19/2024   HCT 37.9 02/19/2024   MCV 91.5 02/19/2024   PLT 159 02/19/2024      Chemistry      Component Value Date/Time   NA 142 02/19/2024 1538   NA 145 (H) 02/27/2023 1140   K 4.0 02/19/2024 1538   CL 105 02/19/2024 1538   CO2 27 02/19/2024 1538   BUN 23 02/19/2024 1538   BUN 22 02/27/2023 1140   CREATININE 0.70 02/19/2024 1538   CREATININE 0.85 01/29/2016 0910      Component Value Date/Time   CALCIUM  9.9 02/19/2024 1538   ALKPHOS 91 02/19/2024 1538   AST 30 02/19/2024 1538   ALT 38 02/19/2024 1538   BILITOT 0.3 02/19/2024 1538       RADIOGRAPHIC STUDIES: CT Chest W Contrast Result Date: 02/21/2024 CLINICAL DATA:  Thymoma or thymic carcinoma Thymic carcinoma history * Tracking Code: BO * EXAM: CT CHEST WITH CONTRAST TECHNIQUE: Multidetector CT imaging of the chest was performed during intravenous contrast administration. RADIATION DOSE REDUCTION: This exam was performed according to the departmental dose-optimization program which includes automated exposure control, adjustment of the mA and/or kV according to patient size and/or use of iterative reconstruction technique. CONTRAST:  75mL OMNIPAQUE  IOHEXOL  300 MG/ML  SOLN COMPARISON:  PET-CT 10/14/2023.  Chest CT 02/24/2023. FINDINGS: Cardiovascular: No acute vascular findings are demonstrated. There is diffuse atherosclerosis of the aorta, great vessels and coronary arteries status post median sternotomy and aortic valve replacement. The heart size is normal. There is no pericardial effusion. Mediastinum/Nodes: There are no enlarged mediastinal, hilar or axillary lymph nodes.Scattered small mediastinal lymph nodes appear unchanged.  No evidence of recurrent pre-vascular mass. The thyroid  gland, trachea and esophagus demonstrate no significant findings. Lungs/Pleura: No pleural effusion or pneumothorax. Stable scattered mild pulmonary scarring bilaterally. No confluent airspace disease or suspicious nodularity. Upper abdomen: No significant findings are demonstrated in the visualized upper abdomen status post cholecystectomy. There is aortic and branch vessel atherosclerosis. Musculoskeletal/Chest wall: No evidence of chest wall mass or acute osseous abnormality. The bones are diffusely demineralized with widespread lytic lesions attributed to the history of multiple myeloma. The median sternotomy has healed. There are stable chronic compression deformities in the lower thoracic spine status post spinal augmentation at T11. Old rib fractures are present bilaterally. IMPRESSION: 1. Stable chest CT without evidence of local recurrence or metastatic disease. 2. Stable widespread lytic lesions attributed to the history of multiple myeloma. Associated old fractures as described. No acute osseous abnormalities identified. 3.  Aortic Atherosclerosis (ICD10-I70.0). Electronically Signed   By: Elsie Perone M.D.   On: 02/21/2024 18:39   MR Pelvis W Wo Contrast Result Date: 01/28/2024 CLINICAL DATA:  Occult malignancy. Multiple myeloma  with worsening bilateral hip pain. EXAM: MRI PELVIS WITHOUT AND WITH CONTRAST TECHNIQUE: Multiplanar multisequence MR imaging of the pelvis was performed both before and after administration of intravenous contrast. CONTRAST:  6mL GADAVIST  GADOBUTROL  1 MMOL/ML IV SOLN COMPARISON:  PET-CT 10/14/2023 and 08/22/2022. Abdominopelvic CT 06/24/2023 and 07/30/2022. FINDINGS: Urinary Tract: The visualized distal ureters and bladder appear unremarkable. Bowel: No bowel wall thickening, distention or surrounding inflammation identified within the pelvis. Vascular/Lymphatic: No enlarged pelvic lymph nodes identified. Aortoiliac  atherosclerosis without evidence of aneurysm or large vessel occlusion. Reproductive: There is heterogeneous thickening of the endometrium which measures up to 1.1 cm in thickness on sagittal image 32/12. This appears similar to recent prior imaging and was not associated with hypermetabolic activity on PET-CT. 9 mm fundal fibroid and small cervical nabothian cysts are noted. As seen on recent imaging, there is a simple appearing cystic lesion in the left adnexa which measures 6.2 x 4.3 x 4.2 cm. This demonstrates no solid or enhancing components. There was no associated hypermetabolic activity on PET-CT. Other: No ascites. Musculoskeletal: Diffuse heterogeneity of the bone marrow with multiple chronic lumbar compression deformities attributed to treated multiple myeloma. There are chronic bilateral L5 pars defects with a grade 1 anterolisthesis and biforaminal narrowing at L5-S1. No acute fractures or aggressive osseous lesions are identified. There are mild degenerative changes of both hips. The right gluteus minimus tendon is torn with associated surrounding soft tissue edema and enhancement. There is a moderate amount of asymmetric fluid in the right greater trochanteric bursa. Mild gluteus tendinosis on the left. No other significant musculotendinous abnormalities identified. IMPRESSION: 1. Right gluteus minimus tendon tear with associated surrounding soft tissue edema and enhancement. Moderate amount of asymmetric fluid in the right greater trochanteric bursa. These findings likely contribute to the patient's right hip pain. 2. No other acute findings identified. 3. Grossly stable findings of treated multiple myeloma with chronic pathologic fractures. No acute fractures identified. 4. 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 5. Known slowly enlarging left adnexal cystic lesion demonstrates no aggressive characteristics and was not hypermetabolic on  prior PET-CT. This supports a benign etiology. 6. Chronic heterogeneous thickening of the endometrium, similar to recent prior imaging and not associated with hypermetabolic activity on PET-CT. Recommend further evaluation with pelvic ultrasound if the patient has vaginal bleeding. In the absence of symptoms, consider pelvic ultrasound to follow-up the endometrium and left adnexal lesion in 6-12 months. Electronically Signed   By: Elsie Perone M.D.   On: 01/28/2024 16:09     ASSESSMENT AND PLAN: This is a very pleasant 83 years old white female with  1) history of stage I (T1a, N0, M0) thymoma type AB diagnosed in October 2023 status post resection under the care of Dr. Kerrin on January 28, 2022 with close resection margin and microscopic infiltration of the capsule. 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  The patient is undergoing systemic therapy with daratumumab , Revlimid  15 mg p.o. daily for 21 days every 4 weeks in addition to Decadron  20 mg weekly with the treatment.  Status post 12 cycles.  She tolerated her treatment fairly well.  She is currently on observation. She had repeat myeloma panel and CT scan of the chest performed recently.  I personally and independently reviewed the lab and scan results with the patient and her son.  There is no evidence for disease progression. Assessment and Plan Assessment & Plan Multiple myeloma post  systemic therapy on observation Multiple myeloma diagnosed in May 2025, status post systemic therapy with Revlimid , daratumumab , and Decadron . Completed eleven cycles and currently on observation. Myeloma panel is under control with no significant issues. IgG levels are low, posing a risk for infection. 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 Previous IgG levels  were significantly elevated at 6000. - Will administer monthly IVIG to maintain IgG levels above 500  Chronic diarrhea managed with colestipol Chronic diarrhea managed with colestipol. Attempts to discontinue colestipol have been unsuccessful, indicating its necessity for symptom control. - Continue colestipol for diarrhea management  Right hip pain, improving, pending orthopedic evaluation Right hip pain has improved but is still present. MRI ordered to evaluate the cause, possibly a strained tendon. - Continue with orthopedic evaluation next week  History of thymoma, no current evidence of disease Thymoma with no current evidence of disease. Recent chest CT scan shows no abnormalities related to thymoma. - Continue routine monitoring The patient was advised to call immediately if she has any concerning symptoms in the interval. The patient voices understanding of current disease status and treatment options and is in agreement with the current care plan.  All questions were answered. The patient knows to call the clinic with any problems, questions or concerns. We can certainly see the patient much sooner if necessary.  The total time spent in the appointment was 30 minutes.  Disclaimer: This note was dictated with voice recognition software. Similar sounding words can inadvertently be transcribed and may not be corrected upon review.

## 2024-03-01 ENCOUNTER — Ambulatory Visit: Admitting: Orthopaedic Surgery

## 2024-03-01 ENCOUNTER — Other Ambulatory Visit: Payer: Self-pay | Admitting: Radiology

## 2024-03-01 DIAGNOSIS — M545 Low back pain, unspecified: Secondary | ICD-10-CM | POA: Diagnosis not present

## 2024-03-01 DIAGNOSIS — G8929 Other chronic pain: Secondary | ICD-10-CM

## 2024-03-01 NOTE — Progress Notes (Signed)
 The patient is a 83 year old with multiple myeloma who was sent to me from Dr. Gatha to evaluate and treat right-sided low back pain and some hip pain.  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  There is also other old compression deformities from her multiple myeloma.  Today she describes only pain in the lower aspect of her lumbar spine to the right side.  She says it is more of annoying pain and not daily.  She tries to stay active.  She walks without any assistive device.  She denies any groin pain.  Examination of her right hip shows it moves smoothly and fluidly no blocks rotation and no pain in the groin at all.  She has only minimal pain to palpation of the trochanteric area of her right hip.  Most of this seems to be low back related.  There is no radicular component she has good strength in her lower extremities.  She is someone who would definitely benefit from outpatient physical therapy for any modalities that can help decrease her right-sided low back pain and right hip pain.  We will work on getting this set up and then we will see her back in about 6 weeks to see how she is doing overall.  Have also recommended topical Voltaren gel to rub on her back 2-3 times daily.

## 2024-03-09 ENCOUNTER — Ambulatory Visit: Admitting: Physical Therapy

## 2024-03-09 ENCOUNTER — Other Ambulatory Visit: Payer: Self-pay

## 2024-03-09 ENCOUNTER — Encounter: Payer: Self-pay | Admitting: Physical Therapy

## 2024-03-09 DIAGNOSIS — G8929 Other chronic pain: Secondary | ICD-10-CM | POA: Diagnosis not present

## 2024-03-09 DIAGNOSIS — M545 Low back pain, unspecified: Secondary | ICD-10-CM | POA: Insufficient documentation

## 2024-03-09 DIAGNOSIS — M6281 Muscle weakness (generalized): Secondary | ICD-10-CM | POA: Diagnosis present

## 2024-03-09 DIAGNOSIS — R252 Cramp and spasm: Secondary | ICD-10-CM | POA: Diagnosis present

## 2024-03-09 DIAGNOSIS — M25551 Pain in right hip: Secondary | ICD-10-CM | POA: Insufficient documentation

## 2024-03-09 DIAGNOSIS — M5459 Other low back pain: Secondary | ICD-10-CM | POA: Insufficient documentation

## 2024-03-09 NOTE — Therapy (Signed)
 OUTPATIENT PHYSICAL THERAPY THORACOLUMBAR EVALUATION   Patient Name: [name] MRN: [id number] DOB:12-01-1940, 83 y.o., female Today's Date: 03/09/2024  END OF SESSION:  PT End of Session - 03/09/24 1733     Visit Number 1    Date for Recertification  05/04/24    Authorization Type [employer] MCR (auth requested)    Progress Note Due on Visit 10    PT Start Time 1620    PT Stop Time 1700    PT Time Calculation (min) 40 min    Activity Tolerance Patient tolerated treatment well    Behavior During Therapy [facility] for tasks assessed/performed          Past Medical History:  Diagnosis Date   Abnormal vaginal Pap smear 1994   annual paps for years after that.more recently every other year,last in 2012-we agreed not to do them anymore   Anxiety    no rx   Aortic stenosis    s/p AVR with bioprosthesis   Ascending aorta dilatation    40mm by echo 10/2021   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/2015   Carotid artery stenosis    < 50% stenosis bilaterally by doppler 07/2016   Coronary artery disease 2008   Coronary Ca score of 331 with minimal multivessel plaque < 25% stenosis by coronary CTA 8/23   Heart murmur    per pt   Hypercholesteremia    Hypertension    Obesity    Osteopenia    declines treatment   Pneumonia 1995   Pulmonary HTN (HCC)    mild to moderate by echo 7/23 with PASP   Shoulder pain    Due to arthritis   Vitamin D insufficiency    Past Surgical History:  Procedure Laterality Date   AORTIC VALVE REPLACEMENT N/A 10/14/2013   Procedure: AORTIC VALVE REPLACEMENT (AVR);  Surgeon: Dorise MARLA Fellers, MD;  Location: [facility] OR;  Service: Open Heart Surgery;  Laterality: N/A;   CARDIAC CATHETERIZATION     CATARACT EXTRACTION, BILATERAL     CHOLECYSTECTOMY  04/08/1988   ESOPHAGOGASTRODUODENOSCOPY (EGD) WITH PROPOFOL  N/A 09/05/2022   Procedure: ESOPHAGOGASTRODUODENOSCOPY (EGD) WITH PROPOFOL ;  Surgeon: Elicia Claw, MD;  Location: [facility] ENDOSCOPY;  Service: Gastroenterology;   Laterality: N/A;   INTRAOPERATIVE TRANSESOPHAGEAL ECHOCARDIOGRAM N/A 10/14/2013   Procedure: INTRAOPERATIVE TRANSESOPHAGEAL ECHOCARDIOGRAM;  Surgeon: Dorise MARLA Fellers, MD;  Location: [facility] OR;  Service: Open Heart Surgery;  Laterality: N/A;   IVC FILTER INSERTION N/A 09/06/2022   Procedure: IVC FILTER INSERTION;  Surgeon: Magda Debby SAILOR, MD;  Location: [facility] INVASIVE CV LAB;  Service: Cardiovascular;  Laterality: N/A;   KYPHOPLASTY Bilateral 03/27/2022   Procedure: KYPHOPLASTY AND BIOPSY THORACIC ELEVEN;  Surgeon: Lanis Pupa, MD;  Location: [facility] OR;  Service: Neurosurgery;  Laterality: Bilateral;   LEFT AND RIGHT HEART CATHETERIZATION WITH CORONARY ANGIOGRAM N/A 09/16/2013   Procedure: LEFT AND RIGHT HEART CATHETERIZATION WITH CORONARY ANGIOGRAM;  Surgeon: Wilbert JONELLE Bihari, MD;  Location: [facility] CATH LAB;  Service: Cardiovascular;  Laterality: N/A;   TONSILLECTOMY     Patient Active Problem List   Diagnosis Date Noted   Shoulder pain 06/25/2023   Hypokalemia 09/25/2022   Protein-calorie malnutrition, severe 09/02/2022   Pathological fracture of thoracic vertebra 08/31/2022   Acute renal failure superimposed on stage 3a chronic kidney disease (HCC) 08/31/2022   AMS (altered mental status) 08/31/2022   Electrolyte abnormality 08/31/2022   Anemia 08/31/2022   Goals of care, counseling/discussion 08/31/2022   Acute encephalopathy 08/31/2022   Multiple myeloma (HCC) 08/27/2022   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/29/2022   Mediastinal mass 01/28/2022   Pulmonary HTN (HCC) 11/16/2021   Ascending aorta dilatation    Hyperlipidemia 04/16/2016   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/2015   Carotid artery stenosis 11/10/2013   S/P aortic valve replacement 10/17/2013   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 10/14/2013   Essential hypertension, benign 08/04/2013    PCP: Dwight Trula SQUIBB, MD   REFERRING PROVIDER: Vernetta Lonni GRADE, MD  REFERRING DIAG:   Diagnosis  M54.50,G89.29 (ICD-10-CM) - Chronic right-sided low back pain without sciatica    Rationale for Evaluation and Treatment: Rehabilitation  THERAPY DIAG:  Other low back pain  Pain in right hip  Muscle weakness (generalized)  Cramp and spasm  ONSET DATE: over a year ago  SUBJECTIVE:                                                                                                                                                                                           SUBJECTIVE STATEMENT: Patient presents with low back and right hip pain that began over a year ago. She has trouble with sit to stand transfers and walking. Some days are worst than others. Patient has numbness and tingling in both legs that goes down to her feet. This is aggravated with more standing and sitting activities.   PERTINENT HISTORY:  HTN; Multiple myeloma   PAIN:  Are you having pain? Yes: NPRS scale: 6(currently) 10(worst) /10 Pain location: low back; posterior right hip Pain description: back/hip: dull, achy  Aggravating factors: walking, transfers, bending Relieving factors: Medication   PRECAUTIONS: None  RED FLAGS: None   WEIGHT BEARING RESTRICTIONS: No  FALLS:  Has patient fallen in last 6 months? No  LIVING ENVIRONMENT: Lives with: lives alone Lives in: House/apartment Stairs: Yes: External: 6 steps; can reach both Has following equipment at home: Single point cane, Walker - 2 wheeled, shower chair, and Grab bars  OCCUPATION: Retired  PLOF: Independent, Independent with basic ADLs, Independent with household mobility without device, Independent with community mobility without device, Independent with gait, Independent with transfers, and Leisure: Read; play with great grandchildren   PATIENT GOALS: To get over having pain  NEXT MD VISIT: PRN  OBJECTIVE:  Note: Objective measures were completed at Evaluation unless otherwise noted.  DIAGNOSTIC FINDINGS:  01/26/2024  MRI IMPRESSION: 1. Right gluteus minimus tendon tear with associated surrounding soft tissue edema and enhancement. Moderate amount of asymmetric fluid in the right greater trochanteric bursa. These findings likely contribute to the patient's right hip pain. 2. No other acute findings identified. 3. Grossly stable findings of treated  multiple myeloma with chronic pathologic fractures. No acute fractures identified. 4. 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 5. Known slowly enlarging left adnexal cystic lesion demonstrates no aggressive characteristics and was not hypermetabolic on prior PET-CT. This supports a benign etiology.  PATIENT SURVEYS:  Modified Oswestry: 8/50 16%  Interpretation of scores: Score Category Description  0-20% Minimal Disability The patient can cope with most living activities. Usually no treatment is indicated apart from advice on lifting, sitting and exercise  21-40% Moderate Disability The patient experiences more pain and difficulty with sitting, lifting and standing. Travel and social life are more difficult and they may be disabled from work. Personal care, sexual activity and sleeping are not grossly affected, and the patient can usually be managed by conservative means  41-60% Severe Disability Pain remains the main problem in this group, but activities of daily living are affected. These patients require a detailed investigation  61-80% Crippled Back pain impinges on all aspects of the patient's life. Positive intervention is required  81-100% Bed-bound These patients are either bed-bound or exaggerating their symptoms  Bluford FORBES Zoe DELENA Karon DELENA, et al. Surgery versus conservative management of stable thoracolumbar fracture: the PRESTO feasibility RCT. [place] ([place]): [employer]; 2021 Nov. [facility] Technology Assessment, No. 25.62.) Appendix 3, Oswestry Disability Index category  descriptors. Available from: Findjewelers.cz  Minimally Clinically Important Difference (MCID) = 12.8%  COGNITION: Overall cognitive status: Within functional limits for tasks assessed     SENSATION: Patient verbalizes numbness & tingling in bilateral legs  MUSCLE LENGTH: Hamstrings:decreased bilateral    POSTURE: rounded shoulders and increased thoracic kyphosis  PALPATION: Pain and tenderness with palpation of lumbar paraspinals   LUMBAR ROM: * pain  AROM eval  Flexion Bends to shins *  Extension   Right lateral flexion Bends past knee joint  Left lateral flexion Bends past knee joint  Right rotation   Left rotation 40% limited*   (Blank rows = not tested)  LOWER EXTREMITY ROM:   WFL bilateral   LOWER EXTREMITY MMT:    MMT Right eval Left eval  Hip flexion 4- 4  Hip extension    Hip abduction 4 4  Hip adduction 4 4  Hip internal rotation    Hip external rotation    Knee flexion 4 4  Knee extension 4- 4  Ankle dorsiflexion    Ankle plantarflexion    Ankle inversion    Ankle eversion     (Blank rows = not tested)    FUNCTIONAL TESTS:  5 times sit to stand: 23.73 sec with UE support (increased pain) Timed up and go (TUG): 14.74 sec  GAIT:  Comments: Decreased cadence & step length  TREATMENT DATE: 03/09/2024 Initial Evaluation & HEP created  PATIENT EDUCATION:  Education details: Provided patient education and discussion on quality of life and stress management. Discussed motivators, challenges, and goals for future.   Person educated: Patient Education method: Explanation, Demonstration, and Handouts Education comprehension: verbalized understanding, returned demonstration, and needs further education  HOME EXERCISE PROGRAM: Access Code: [email or web address] URL:  [email or web address] Date: 03/09/2024 Prepared by: Kristeen Sar  Exercises - Seated Hamstring Stretch  - 1 x daily - 7 x weekly - 2 sets - 20-30s hold - Seated Figure 4 Piriformis Stretch  - 1 x daily - 7 x weekly - 2 sets - 10 reps - 20-30s hold - Seated Long Arc Quad  - 1 x daily - 7 x weekly - 1-2 sets - 10 reps - 3s hold - Seated Transversus Abdominis Bracing  - 1 x daily - 7 x weekly - 1 sets - 10 reps - Standing March with Counter Support  - 1 x daily - 7 x weekly - 1-2 sets - 10 reps  ASSESSMENT:  CLINICAL IMPRESSION: Patient is a 83 y.o. Female who was seen today for physical therapy evaluation and treatment for low back pain and right hip pain. Belva presents with chronic low back pain right hip pain that has aggravated her for over a year. Her pain is interfering with her performance with sit to stand transfers and walking tolerance. She reports numbness and tingling in both legs that is not usually dependent on her position. Based on evaluation noted muscle weakness, poor posture, and increased falls risk. Patient is highly motivated and wants to better manage pain. Patient will benefit from skilled PT to address the below impairments and improve overall function.   OBJECTIVE IMPAIRMENTS: Abnormal gait, decreased balance, decreased mobility, difficulty walking, decreased ROM, decreased strength, hypomobility, increased muscle spasms, impaired flexibility, impaired sensation, postural dysfunction, and pain.   ACTIVITY LIMITATIONS: carrying, lifting, bending, sitting, standing, squatting, transfers, bed mobility, reach over head, hygiene/grooming, and locomotion level  PARTICIPATION LIMITATIONS: meal prep, cleaning, laundry, interpersonal relationship, shopping, and community activity  PERSONAL FACTORS: Age, Fitness, Time since onset of injury/illness/exacerbation, and 1-2 comorbidities: HTN; Hx melanoma  are also affecting patient's functional outcome.   REHAB  POTENTIAL: Good  CLINICAL DECISION MAKING: Stable/uncomplicated  EVALUATION COMPLEXITY: Low   GOALS: Goals reviewed with patient? Yes  SHORT TERM GOALS: Target date: 04/06/2024  Patient will be independent with initial HEP. Baseline:  Goal status: INITIAL  2.  Patient will report > or = to 30% improvement in low back and right hip pain  since starting PT. Baseline:  Goal status: INITIAL   3.  Patient will be able to participate in a 3/6 MWT to establish baseline for the test Baseline:  Goal status: INITIAL  4.  Patient will demonstrate a controlled descent when performing sit to stand transfer.  Baseline:  Goal status: INITIAL    LONG TERM GOALS: Target date: 05/04/2024  Patient will demonstrate independence in advanced HEP. Baseline:  Goal status: INITIAL  2.  Patient will report > or = to 50% improvement in back & right hip pain since starting PT. Baseline:  Goal status: INITIAL  3.  Patient will perform TUG in < or = to 12 sec to decrease falls risk. Baseline: 14.74 sec Goal status: INITIAL   4.  Patient will perform 5STS in < or = to 19 sec due to improved LE strength.  Baseline: 23.73 sec Goal status: INITIAL    PLAN:  PT FREQUENCY: 2x/week  PT DURATION: 8 weeks  PLANNED  INTERVENTIONS: 97164- PT Re-evaluation, 97110-Therapeutic exercises, 97530- Therapeutic activity, [id number]- Neuromuscular re-education, [phone]- Self Care, [id number]- Manual therapy, [phone]- Gait training, [phone]- Canalith repositioning, [id number]- Aquatic Therapy, [phone]- Electrical stimulation (unattended), [phone]- Electrical stimulation (manual), [id number]- Vasopneumatic device, [id number]- Ultrasound, [id number]- Traction (mechanical), [id number]- Ionotophoresis 4mg /ml Dexamethasone , [id number] (1-2 muscles), [id number] (3+ muscles)- Dry Needling, Patient/Family education, Balance training, Stair training, Taping, Joint mobilization, Joint manipulation, Spinal manipulation, Spinal mobilization, Vestibular training, Cryotherapy, and  Moist heat.  PLAN FOR NEXT SESSION: Review HEP; TA activaton; NuStep; 3 way stability ball stretch; TA activation   Kristeen Sar, PT, DPT 03/09/24 5:34 PM [facility] [address] [place], [place] [address] Phone # [phone] Fax [phone]

## 2024-03-11 ENCOUNTER — Encounter: Payer: Self-pay | Admitting: Physical Therapy

## 2024-03-11 ENCOUNTER — Ambulatory Visit: Admitting: Physical Therapy

## 2024-03-11 DIAGNOSIS — M25551 Pain in right hip: Secondary | ICD-10-CM

## 2024-03-11 DIAGNOSIS — R252 Cramp and spasm: Secondary | ICD-10-CM

## 2024-03-11 DIAGNOSIS — M5459 Other low back pain: Secondary | ICD-10-CM | POA: Diagnosis not present

## 2024-03-11 DIAGNOSIS — M6281 Muscle weakness (generalized): Secondary | ICD-10-CM

## 2024-03-11 NOTE — Therapy (Signed)
 OUTPATIENT PHYSICAL THERAPY THORACOLUMBAR TREATMENT   Patient Name: [name] MRN: [id number] DOB:May 27, 1940, 83 y.o., female Today's Date: 03/11/2024  END OF SESSION:  PT End of Session - 03/11/24 0935     Visit Number 2    Date for Recertification  05/04/24    Authorization Type [employer] APPROVAL 16 VISITS; 03/09/24 - 06/07/2024 JLUY#[id number]    Authorization Time Period 03/09/24 - 06/07/2024    Authorization - Visit Number 2    Authorization - Number of Visits 16    Progress Note Due on Visit 10    PT Start Time 0850    PT Stop Time 0932    PT Time Calculation (min) 42 min    Activity Tolerance Patient tolerated treatment well    Behavior During Therapy WFL for tasks assessed/performed           Past Medical History:  Diagnosis Date   Abnormal vaginal Pap smear 1994   annual paps for years after that.more recently every other year,last in 2012-we agreed not to do them anymore   Anxiety    no rx   Aortic stenosis    s/p AVR with bioprosthesis   Ascending aorta dilatation    40mm by echo 10/2021   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/2015   Carotid artery stenosis    < 50% stenosis bilaterally by doppler 07/2016   Coronary artery disease 2008   Coronary Ca score of 331 with minimal multivessel plaque < 25% stenosis by coronary CTA 8/23   Heart murmur    per pt   Hypercholesteremia    Hypertension    Obesity    Osteopenia    declines treatment   Pneumonia 1995   Pulmonary HTN (HCC)    mild to moderate by echo 7/23 with PASP   Shoulder pain    Due to arthritis   Vitamin D insufficiency    Past Surgical History:  Procedure Laterality Date   AORTIC VALVE REPLACEMENT N/A 10/14/2013   Procedure: AORTIC VALVE REPLACEMENT (AVR);  Surgeon: Dorise MARLA Fellers, MD;  Location: [facility] OR;  Service: Open Heart Surgery;  Laterality: N/A;   CARDIAC CATHETERIZATION     CATARACT EXTRACTION, BILATERAL     CHOLECYSTECTOMY  04/08/1988   ESOPHAGOGASTRODUODENOSCOPY (EGD) WITH PROPOFOL  N/A 09/05/2022    Procedure: ESOPHAGOGASTRODUODENOSCOPY (EGD) WITH PROPOFOL ;  Surgeon: Elicia Claw, MD;  Location: [facility] ENDOSCOPY;  Service: Gastroenterology;  Laterality: N/A;   INTRAOPERATIVE TRANSESOPHAGEAL ECHOCARDIOGRAM N/A 10/14/2013   Procedure: INTRAOPERATIVE TRANSESOPHAGEAL ECHOCARDIOGRAM;  Surgeon: Dorise MARLA Fellers, MD;  Location: [facility] OR;  Service: Open Heart Surgery;  Laterality: N/A;   IVC FILTER INSERTION N/A 09/06/2022   Procedure: IVC FILTER INSERTION;  Surgeon: Magda Debby SAILOR, MD;  Location: [facility] INVASIVE CV LAB;  Service: Cardiovascular;  Laterality: N/A;   KYPHOPLASTY Bilateral 03/27/2022   Procedure: KYPHOPLASTY AND BIOPSY THORACIC ELEVEN;  Surgeon: Lanis Pupa, MD;  Location: [facility] OR;  Service: Neurosurgery;  Laterality: Bilateral;   LEFT AND RIGHT HEART CATHETERIZATION WITH CORONARY ANGIOGRAM N/A 09/16/2013   Procedure: LEFT AND RIGHT HEART CATHETERIZATION WITH CORONARY ANGIOGRAM;  Surgeon: Wilbert JONELLE Bihari, MD;  Location: [facility] CATH LAB;  Service: Cardiovascular;  Laterality: N/A;   TONSILLECTOMY     Patient Active Problem List   Diagnosis Date Noted   Shoulder pain 06/25/2023   Hypokalemia 09/25/2022   Protein-calorie malnutrition, severe 09/02/2022   Pathological fracture of thoracic vertebra 08/31/2022   Acute renal failure superimposed on stage 3a chronic kidney disease (HCC) 08/31/2022   AMS (altered mental status)  08/31/2022   Electrolyte abnormality 08/31/2022   Anemia 08/31/2022   Goals of care, counseling/discussion 08/31/2022   Acute encephalopathy 08/31/2022   Multiple myeloma (HCC) 08/27/2022   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/29/2022   Mediastinal mass 01/28/2022   Pulmonary HTN (HCC) 11/16/2021   Ascending aorta dilatation    Hyperlipidemia 04/16/2016   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/2015   Carotid artery stenosis 11/10/2013   S/P aortic valve replacement 10/17/2013   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 10/14/2013    Essential hypertension, benign 08/04/2013    PCP: Dwight Trula SQUIBB, MD   REFERRING PROVIDER: Vernetta Lonni GRADE, MD  REFERRING DIAG:  Diagnosis  M54.50,G89.29 (ICD-10-CM) - Chronic right-sided low back pain without sciatica    Rationale for Evaluation and Treatment: Rehabilitation  THERAPY DIAG:  Other low back pain  Pain in right hip  Muscle weakness (generalized)  Cramp and spasm  ONSET DATE: over a year ago  SUBJECTIVE:                                                                                                                                                                                           SUBJECTIVE STATEMENT: Patient reports she is doing good today. She is not currently having any pain. She has been compliant with HEP.  From Eval: Patient presents with low back and right hip pain that began over a year ago. She has trouble with sit to stand transfers and walking. Some days are worst than others. Patient has numbness and tingling in both legs that goes down to her feet. This is aggravated with more standing and sitting activities.   PERTINENT HISTORY:  HTN; Multiple myeloma   PAIN:  Are you having pain? Yes: NPRS scale: 6(currently) 10(worst) /10 Pain location: low back; posterior right hip Pain description: back/hip: dull, achy  Aggravating factors: walking, transfers, bending Relieving factors: Medication   PRECAUTIONS: None  RED FLAGS: None   WEIGHT BEARING RESTRICTIONS: No  FALLS:  Has patient fallen in last 6 months? No  LIVING ENVIRONMENT: Lives with: lives alone Lives in: House/apartment Stairs: Yes: External: 6 steps; can reach both Has following equipment at home: Single point cane, Walker - 2 wheeled, shower chair, and Grab bars  OCCUPATION: Retired  PLOF: Independent, Independent with basic ADLs, Independent with household mobility without device, Independent with community mobility without device, Independent with gait,  Independent with transfers, and Leisure: Read; play with great grandchildren   PATIENT GOALS: To get over having pain  NEXT MD VISIT: PRN  OBJECTIVE:  Note: Objective measures were completed at Evaluation unless otherwise noted.  DIAGNOSTIC FINDINGS:  01/26/2024 MRI IMPRESSION: 1. Right gluteus minimus tendon tear with associated surrounding soft tissue edema and enhancement. Moderate amount of asymmetric fluid in the right greater trochanteric bursa. These findings likely contribute to the patient's right hip pain. 2. No other acute findings identified. 3. Grossly stable findings of treated multiple myeloma with chronic pathologic fractures. No acute fractures identified. 4. 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 5. Known slowly enlarging left adnexal cystic lesion demonstrates no aggressive characteristics and was not hypermetabolic on prior PET-CT. This supports a benign etiology.  PATIENT SURVEYS:  Modified Oswestry: 8/50 16%  Interpretation of scores: Score Category Description  0-20% Minimal Disability The patient can cope with most living activities. Usually no treatment is indicated apart from advice on lifting, sitting and exercise  21-40% Moderate Disability The patient experiences more pain and difficulty with sitting, lifting and standing. Travel and social life are more difficult and they may be disabled from work. Personal care, sexual activity and sleeping are not grossly affected, and the patient can usually be managed by conservative means  41-60% Severe Disability Pain remains the main problem in this group, but activities of daily living are affected. These patients require a detailed investigation  61-80% Crippled Back pain impinges on all aspects of the patient's life. Positive intervention is required  81-100% Bed-bound These patients are either bed-bound or exaggerating their symptoms  Bluford FORBES Zoe DELENA Karon DELENA, et al. Surgery versus conservative management of stable thoracolumbar fracture: the PRESTO feasibility RCT. [place] ([place]): [employer]; 2021 Nov. [facility] Technology Assessment, No. 25.62.) Appendix 3, Oswestry Disability Index category descriptors. Available from: Findjewelers.cz  Minimally Clinically Important Difference (MCID) = 12.8%  COGNITION: Overall cognitive status: Within functional limits for tasks assessed     SENSATION: Patient verbalizes numbness & tingling in bilateral legs  MUSCLE LENGTH: Hamstrings:decreased bilateral    POSTURE: rounded shoulders and increased thoracic kyphosis  PALPATION: Pain and tenderness with palpation of lumbar paraspinals   LUMBAR ROM: * pain  AROM eval  Flexion Bends to shins *  Extension   Right lateral flexion Bends past knee joint  Left lateral flexion Bends past knee joint  Right rotation   Left rotation 40% limited*   (Blank rows = not tested)  LOWER EXTREMITY ROM:   WFL bilateral   LOWER EXTREMITY MMT:    MMT Right eval Left eval  Hip flexion 4- 4  Hip extension    Hip abduction 4 4  Hip adduction 4 4  Hip internal rotation    Hip external rotation    Knee flexion 4 4  Knee extension 4- 4  Ankle dorsiflexion    Ankle plantarflexion    Ankle inversion    Ankle eversion     (Blank rows = not tested)    FUNCTIONAL TESTS:  5 times sit to stand: 23.73 sec with UE support (increased pain) Timed up and go (TUG): 14.74 sec  GAIT:  Comments: Decreased cadence & step length  TREATMENT DATE:  03/11/2024 NuStep Level 3 5 mins - PT present to discuss status Seated hamstring stretch 2 x 20 sec bilateral  Seated figure four stretch 2 x 20 sec bilateral  Seated LAQ 2 x 10 bilateral 0# Seated TA activation with ball press 2 x 10 Seated hip adduction with ball squeeze 2 x 10 Seated hip abduction with yellow loop x 20 3 way stability ball stretch with  blue stability ball x  8 each direction  Standing march 2 x 10  Seated hip to hip with 3# DB x 10 total Seated ear to hip 3# DB x 10 each side   03/09/2024 Initial Evaluation & HEP created                                                                                                                                 PATIENT EDUCATION:  Education details: Provided patient education and discussion on quality of life and stress management. Discussed motivators, challenges, and goals for future.   Person educated: Patient Education method: Explanation, Demonstration, and Handouts Education comprehension: verbalized understanding, returned demonstration, and needs further education  HOME EXERCISE PROGRAM: Access Code: [email or web address] URL: [email or web address] Date: 03/09/2024 Prepared by: Kristeen Sar  Exercises - Seated Hamstring Stretch  - 1 x daily - 7 x weekly - 2 sets - 20-30s hold - Seated Figure 4 Piriformis Stretch  - 1 x daily - 7 x weekly - 2 sets - 10 reps - 20-30s hold - Seated Long Arc Quad  - 1 x daily - 7 x weekly - 1-2 sets - 10 reps - 3s hold - Seated Transversus Abdominis Bracing  - 1 x daily - 7 x weekly - 1 sets - 10 reps - Standing March with Counter Support  - 1 x daily - 7 x weekly - 1-2 sets - 10 reps  ASSESSMENT:  CLINICAL IMPRESSION: [name] presents to first follow up appointment since evaluation. She verbalized some confusion on how many repetitions to perform so reviewed that with patient. She required minimal verbal cues for form correct while performing exercises. She had pain more in her left lumbar spine that traveled down her leg today. She was able to participate in all planned activities. Progressed TA activation exercises for improved core strength. PT monitored patient response throughout and provided visual and verbal cues as needed. Patient will benefit from skilled PT to address the below impairments and improve overall function.    OBJECTIVE  IMPAIRMENTS: Abnormal gait, decreased balance, decreased mobility, difficulty walking, decreased ROM, decreased strength, hypomobility, increased muscle spasms, impaired flexibility, impaired sensation, postural dysfunction, and pain.   ACTIVITY LIMITATIONS: carrying, lifting, bending, sitting, standing, squatting, transfers, bed mobility, reach over head, hygiene/grooming, and locomotion level  PARTICIPATION LIMITATIONS: meal prep, cleaning, laundry, interpersonal relationship, shopping, and community activity  PERSONAL FACTORS: Age, Fitness, Time since onset of injury/illness/exacerbation, and 1-2 comorbidities: HTN; Hx melanoma  are also affecting patient's functional outcome.   REHAB POTENTIAL: Good  CLINICAL DECISION MAKING: Stable/uncomplicated  EVALUATION COMPLEXITY: Low   GOALS: Goals reviewed with patient? Yes  SHORT TERM GOALS: Target date: 04/06/2024  Patient will be independent with initial HEP. Baseline:  Goal status: INITIAL  2.  Patient will report > or = to 30% improvement in low back and right hip pain  since starting PT. Baseline:  Goal status: INITIAL   3.  Patient will be able to  participate in a 3/6 MWT to establish baseline for the test Baseline:  Goal status: INITIAL  4.  Patient will demonstrate a controlled descent when performing sit to stand transfer.  Baseline:  Goal status: INITIAL    LONG TERM GOALS: Target date: 05/04/2024  Patient will demonstrate independence in advanced HEP. Baseline:  Goal status: INITIAL  2.  Patient will report > or = to 50% improvement in back & right hip pain since starting PT. Baseline:  Goal status: INITIAL  3.  Patient will perform TUG in < or = to 12 sec to decrease falls risk. Baseline: 14.74 sec Goal status: INITIAL   4.  Patient will perform 5STS in < or = to 19 sec due to improved LE strength.  Baseline: 23.73 sec Goal status: INITIAL    PLAN:  PT FREQUENCY: 2x/week  PT DURATION: 8  weeks  PLANNED INTERVENTIONS: 97164- PT Re-evaluation, 97110-Therapeutic exercises, 97530- Therapeutic activity, 97112- Neuromuscular re-education, 97535- Self Care, [id number]- Manual therapy, [id number]- Gait training, [phone]- Canalith repositioning, [id number]- Aquatic Therapy, [phone]- Electrical stimulation (unattended), [phone]- Electrical stimulation (manual), [id number]- Vasopneumatic device, [id number]- Ultrasound, [id number]- Traction (mechanical), [id number]- Ionotophoresis 4mg /ml Dexamethasone , 79439 (1-2 muscles), [id number] (3+ muscles)- Dry Needling, Patient/Family education, Balance training, Stair training, Taping, Joint mobilization, Joint manipulation, Spinal manipulation, Spinal mobilization, Vestibular training, Cryotherapy, and Moist heat.  PLAN FOR NEXT SESSION: assess tolerance to treatment session; continue core strengthening and LE strengthening; lumbar mobility    Kristeen Sar, PT, DPT 03/11/24 9:36 AM [facility] [address], [address] [place], [place] [address] Phone # [phone] Fax [phone]

## 2024-03-16 ENCOUNTER — Inpatient Hospital Stay: Attending: Internal Medicine

## 2024-03-16 ENCOUNTER — Inpatient Hospital Stay

## 2024-03-24 ENCOUNTER — Ambulatory Visit

## 2024-03-24 DIAGNOSIS — M6281 Muscle weakness (generalized): Secondary | ICD-10-CM

## 2024-03-24 DIAGNOSIS — M25551 Pain in right hip: Secondary | ICD-10-CM

## 2024-03-24 DIAGNOSIS — M5459 Other low back pain: Secondary | ICD-10-CM | POA: Diagnosis not present

## 2024-03-24 DIAGNOSIS — R252 Cramp and spasm: Secondary | ICD-10-CM

## 2024-03-24 NOTE — Therapy (Signed)
 OUTPATIENT PHYSICAL THERAPY THORACOLUMBAR TREATMENT   Patient Name: [name] MRN: [id number] DOB:08-15-1940, 83 y.o., female Today's Date: 03/24/2024  END OF SESSION:  PT End of Session - 03/24/24 1148     Visit Number 3    Date for Recertification  05/04/24    Authorization Type [employer] APPROVAL 16 VISITS; 03/09/24 - 06/07/2024 JLUY#[id number]    Authorization - Visit Number 3    Authorization - Number of Visits 16    Progress Note Due on Visit 10    PT Start Time 1102    PT Stop Time 1133   requested to leave early   PT Time Calculation (min) 31 min    Activity Tolerance Patient tolerated treatment well    Behavior During Therapy WFL for tasks assessed/performed            Past Medical History:  Diagnosis Date   Abnormal vaginal Pap smear 1994   annual paps for years after that.more recently every other year,last in 2012-we agreed not to do them anymore   Anxiety    no rx   Aortic stenosis    s/p AVR with bioprosthesis   Ascending aorta dilatation    40mm by echo 10/2021   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/2015   Carotid artery stenosis    < 50% stenosis bilaterally by doppler 07/2016   Coronary artery disease 2008   Coronary Ca score of 331 with minimal multivessel plaque < 25% stenosis by coronary CTA 8/23   Heart murmur    per pt   Hypercholesteremia    Hypertension    Obesity    Osteopenia    declines treatment   Pneumonia 1995   Pulmonary HTN (HCC)    mild to moderate by echo 7/23 with PASP   Shoulder pain    Due to arthritis   Vitamin D insufficiency    Past Surgical History:  Procedure Laterality Date   AORTIC VALVE REPLACEMENT N/A 10/14/2013   Procedure: AORTIC VALVE REPLACEMENT (AVR);  Surgeon: Dorise MARLA Fellers, MD;  Location: [facility] OR;  Service: Open Heart Surgery;  Laterality: N/A;   CARDIAC CATHETERIZATION     CATARACT EXTRACTION, BILATERAL     CHOLECYSTECTOMY  04/08/1988   ESOPHAGOGASTRODUODENOSCOPY (EGD) WITH PROPOFOL  N/A 09/05/2022   Procedure:  ESOPHAGOGASTRODUODENOSCOPY (EGD) WITH PROPOFOL ;  Surgeon: Elicia Claw, MD;  Location: [facility] ENDOSCOPY;  Service: Gastroenterology;  Laterality: N/A;   INTRAOPERATIVE TRANSESOPHAGEAL ECHOCARDIOGRAM N/A 10/14/2013   Procedure: INTRAOPERATIVE TRANSESOPHAGEAL ECHOCARDIOGRAM;  Surgeon: Dorise MARLA Fellers, MD;  Location: [facility] OR;  Service: Open Heart Surgery;  Laterality: N/A;   IVC FILTER INSERTION N/A 09/06/2022   Procedure: IVC FILTER INSERTION;  Surgeon: Magda Debby SAILOR, MD;  Location: [facility] INVASIVE CV LAB;  Service: Cardiovascular;  Laterality: N/A;   KYPHOPLASTY Bilateral 03/27/2022   Procedure: KYPHOPLASTY AND BIOPSY THORACIC ELEVEN;  Surgeon: Lanis Pupa, MD;  Location: [facility] OR;  Service: Neurosurgery;  Laterality: Bilateral;   LEFT AND RIGHT HEART CATHETERIZATION WITH CORONARY ANGIOGRAM N/A 09/16/2013   Procedure: LEFT AND RIGHT HEART CATHETERIZATION WITH CORONARY ANGIOGRAM;  Surgeon: Wilbert JONELLE Bihari, MD;  Location: [facility] CATH LAB;  Service: Cardiovascular;  Laterality: N/A;   TONSILLECTOMY     Patient Active Problem List   Diagnosis Date Noted   Shoulder pain 06/25/2023   Hypokalemia 09/25/2022   Protein-calorie malnutrition, severe 09/02/2022   Pathological fracture of thoracic vertebra 08/31/2022   Acute renal failure superimposed on stage 3a chronic kidney disease (HCC) 08/31/2022   AMS (altered mental status) 08/31/2022  Electrolyte abnormality 08/31/2022   Anemia 08/31/2022   Goals of care, counseling/discussion 08/31/2022   Acute encephalopathy 08/31/2022   Multiple myeloma (HCC) 08/27/2022   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/29/2022   Mediastinal mass 01/28/2022   Pulmonary HTN (HCC) 11/16/2021   Ascending aorta dilatation    Hyperlipidemia 04/16/2016   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/2015   Carotid artery stenosis 11/10/2013   S/P aortic valve replacement 10/17/2013   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 10/14/2013   Essential  hypertension, benign 08/04/2013    PCP: Dwight Trula SQUIBB, MD   REFERRING PROVIDER: Vernetta Lonni GRADE, MD  REFERRING DIAG:  Diagnosis  M54.50,G89.29 (ICD-10-CM) - Chronic right-sided low back pain without sciatica    Rationale for Evaluation and Treatment: Rehabilitation  THERAPY DIAG:  Other low back pain  Pain in right hip  Muscle weakness (generalized)  Cramp and spasm  ONSET DATE: over a year ago  SUBJECTIVE:                                                                                                                                                                                           SUBJECTIVE STATEMENT: I'm not having a good day.  More pain that started yesterday.    From Eval: Patient presents with low back and right hip pain that began over a year ago. She has trouble with sit to stand transfers and walking. Some days are worst than others. Patient has numbness and tingling in both legs that goes down to her feet. This is aggravated with more standing and sitting activities.   PERTINENT HISTORY:  HTN; Multiple myeloma   PAIN: 03/24/24 Are you having pain? Yes: NPRS scale: 6/10 Pain location: low back; posterior right hip Pain description: back/hip: dull, achy  Aggravating factors: walking, transfers, bending Relieving factors: Medication   PRECAUTIONS: None  RED FLAGS: None   WEIGHT BEARING RESTRICTIONS: No  FALLS:  Has patient fallen in last 6 months? No  LIVING ENVIRONMENT: Lives with: lives alone Lives in: House/apartment Stairs: Yes: External: 6 steps; can reach both Has following equipment at home: Single point cane, Walker - 2 wheeled, shower chair, and Grab bars  OCCUPATION: Retired  PLOF: Independent, Independent with basic ADLs, Independent with household mobility without device, Independent with community mobility without device, Independent with gait, Independent with transfers, and Leisure: Read; play with great grandchildren    PATIENT GOALS: To get over having pain  NEXT MD VISIT: PRN  OBJECTIVE:  Note: Objective measures were completed at Evaluation unless otherwise noted.  DIAGNOSTIC FINDINGS:  01/26/2024 MRI IMPRESSION: 1. Right gluteus minimus  tendon tear with associated surrounding soft tissue edema and enhancement. Moderate amount of asymmetric fluid in the right greater trochanteric bursa. These findings likely contribute to the patient's right hip pain. 2. No other acute findings identified. 3. Grossly stable findings of treated multiple myeloma with chronic pathologic fractures. No acute fractures identified. 4. 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 5. Known slowly enlarging left adnexal cystic lesion demonstrates no aggressive characteristics and was not hypermetabolic on prior PET-CT. This supports a benign etiology.  PATIENT SURVEYS:  Modified Oswestry: 8/50 16%  Interpretation of scores: Score Category Description  0-20% Minimal Disability The patient can cope with most living activities. Usually no treatment is indicated apart from advice on lifting, sitting and exercise  21-40% Moderate Disability The patient experiences more pain and difficulty with sitting, lifting and standing. Travel and social life are more difficult and they may be disabled from work. Personal care, sexual activity and sleeping are not grossly affected, and the patient can usually be managed by conservative means  41-60% Severe Disability Pain remains the main problem in this group, but activities of daily living are affected. These patients require a detailed investigation  61-80% Crippled Back pain impinges on all aspects of the patients life. Positive intervention is required  81-100% Bed-bound These patients are either bed-bound or exaggerating their symptoms  Bluford FORBES Zoe DELENA Karon DELENA, et al. Surgery versus conservative management of stable  thoracolumbar fracture: the PRESTO feasibility RCT. [place] ([place]): [employer]; 2021 Nov. [facility] Technology Assessment, No. 25.62.) Appendix 3, Oswestry Disability Index category descriptors. Available from: [email or web address].cz  Minimally Clinically Important Difference (MCID) = 12.8%  COGNITION: Overall cognitive status: Within functional limits for tasks assessed     SENSATION: Patient verbalizes numbness & tingling in bilateral legs  MUSCLE LENGTH: Hamstrings:decreased bilateral    POSTURE: rounded shoulders and increased thoracic kyphosis  PALPATION: Pain and tenderness with palpation of lumbar paraspinals   LUMBAR ROM: * pain  AROM eval  Flexion Bends to shins *  Extension   Right lateral flexion Bends past knee joint  Left lateral flexion Bends past knee joint  Right rotation   Left rotation 40% limited*   (Blank rows = not tested)  LOWER EXTREMITY ROM:   WFL bilateral   LOWER EXTREMITY MMT:    MMT Right eval Left eval  Hip flexion 4- 4  Hip extension    Hip abduction 4 4  Hip adduction 4 4  Hip internal rotation    Hip external rotation    Knee flexion 4 4  Knee extension 4- 4  Ankle dorsiflexion    Ankle plantarflexion    Ankle inversion    Ankle eversion     (Blank rows = not tested)    FUNCTIONAL TESTS:  5 times sit to stand: 23.73 sec with UE support (increased pain) Timed up and go (TUG): 14.74 sec  GAIT:  Comments: Decreased cadence & step length  TREATMENT DATE:  03/24/2024 NuStep Level 3 6 mins - PT present to discuss status Seated hamstring stretch 2 x 20 sec bilateral  Seated figure four stretch 2 x 20 sec bilateral  Seated hip adduction with ball squeeze 2 x 10 Seated hip abduction with red band x 20 3 way stability ball stretch with blue stability ball x 8 forward  Seated hip to hip with 3# DB x 10 total- with TA activation  Seated ear to hip 3# DB x 10 each side-  with TA activation      03/11/2024 NuStep Level 3 5 mins - PT present to discuss status Seated hamstring stretch 2 x 20 sec bilateral  Seated figure four stretch 2 x 20 sec bilateral  Seated LAQ 2 x 10 bilateral 0# Seated TA activation with ball press 2 x 10 Seated hip adduction with ball squeeze 2 x 10 Seated hip abduction with yellow loop x 20 3 way stability ball stretch with blue stability ball x 8 each direction  Standing march 2 x 10  Seated hip to hip with 3# DB x 10 total Seated ear to hip 3# DB x 10 each side   03/09/2024 Initial Evaluation & HEP created                                                                                                                                 PATIENT EDUCATION:  Education details: Provided patient education and discussion on quality of life and stress management. Discussed motivators, challenges, and goals for future.   Person educated: Patient Education method: Explanation, Demonstration, and Handouts Education comprehension: verbalized understanding, returned demonstration, and needs further education  HOME EXERCISE PROGRAM: Access Code: [email or web address] URL: [email or web address] Date: 03/24/2024 Prepared by: Burnard  Exercises - Seated Hamstring Stretch  - 1 x daily - 7 x weekly - 2 sets - 20-30s hold - Seated Figure 4 Piriformis Stretch  - 1 x daily - 7 x weekly - 2 sets - 10 reps - 20-30s hold - Seated Long Arc Quad  - 1 x daily - 7 x weekly - 1-2 sets - 10 reps - 3s hold - Seated Transversus Abdominis Bracing  - 1 x daily - 7 x weekly - 1 sets - 10 reps - Standing March with Counter Support  - 1 x daily - 7 x weekly - 1-2 sets - 10 reps - Seated Hip Adduction Isometrics with Ball  - 1 x daily - 7 x weekly - 1-2 sets - 10 reps - 5 hold - Seated Hip Abduction with Resistance  - 1 x daily - 7 x weekly - 1-2 sets - 10 reps  ASSESSMENT:  CLINICAL IMPRESSION: Lapse in treatment since 03/11/24.  She has been compliant with HEP and was doing  well until she started having Lt knee pain yesterday.   Added to HEP for advanced core activation. She required verbal cues for transverse activation throughout session. She left treatment early to attend a gathering.  PT monitored patient response throughout and provided visual and verbal cues as needed. Patient will benefit from skilled PT to address the below impairments and improve overall function.    OBJECTIVE IMPAIRMENTS: Abnormal gait, decreased balance, decreased mobility, difficulty walking, decreased ROM, decreased strength, hypomobility, increased muscle spasms, impaired flexibility, impaired sensation, postural dysfunction, and pain.   ACTIVITY LIMITATIONS: carrying, lifting, bending, sitting, standing, squatting, transfers, bed mobility, reach over head, hygiene/grooming, and locomotion level  PARTICIPATION LIMITATIONS: meal  prep, cleaning, laundry, interpersonal relationship, shopping, and community activity  PERSONAL FACTORS: Age, Fitness, Time since onset of injury/illness/exacerbation, and 1-2 comorbidities: HTN; Hx melanoma  are also affecting patient's functional outcome.   REHAB POTENTIAL: Good  CLINICAL DECISION MAKING: Stable/uncomplicated  EVALUATION COMPLEXITY: Low   GOALS: Goals reviewed with patient? Yes  SHORT TERM GOALS: Target date: 04/06/2024  Patient will be independent with initial HEP. Baseline: doing current HEP (03/24/24) Goal status: in progress   2.  Patient will report > or = to 30% improvement in low back and right hip pain  since starting PT. Baseline:  Goal status: INITIAL   3.  Patient will be able to participate in a 3/6 MWT to establish baseline for the test Baseline:  Goal status: INITIAL  4.  Patient will demonstrate a controlled descent when performing sit to stand transfer.  Baseline:  Goal status: INITIAL    LONG TERM GOALS: Target date: 05/04/2024  Patient will demonstrate independence in advanced HEP. Baseline:  Goal  status: INITIAL  2.  Patient will report > or = to 50% improvement in back & right hip pain since starting PT. Baseline:  Goal status: INITIAL  3.  Patient will perform TUG in < or = to 12 sec to decrease falls risk. Baseline: 14.74 sec Goal status: INITIAL   4.  Patient will perform 5STS in < or = to 19 sec due to improved LE strength.  Baseline: 23.73 sec Goal status: INITIAL    PLAN:  PT FREQUENCY: 2x/week  PT DURATION: 8 weeks  PLANNED INTERVENTIONS: [id number]- PT Re-evaluation, 97110-Therapeutic exercises, 97530- Therapeutic activity, 97112- Neuromuscular re-education, 97535- Self Care, [id number]- Manual therapy, [id number]- Gait training, [phone]- Canalith repositioning, [id number]- Aquatic Therapy, [phone]- Electrical stimulation (unattended), [phone]- Electrical stimulation (manual), [id number]- Vasopneumatic device, [id number]- Ultrasound, [id number]- Traction (mechanical), [id number]- Ionotophoresis 4mg /ml Dexamethasone , [id number] (1-2 muscles), [id number] (3+ muscles)- Dry Needling, Patient/Family education, Balance training, Stair training, Taping, Joint mobilization, Joint manipulation, Spinal manipulation, Spinal mobilization, Vestibular training, Cryotherapy, and Moist heat.  PLAN FOR NEXT SESSION: assess tolerance to treatment session; continue core strengthening and LE strengthening; lumbar mobility. 3 or 6 min walk test   Burnard Joy, PT 03/24/2024 11:57 AM  [facility] [address], [address] [place], [place] [address] Phone # [phone] Fax [phone]

## 2024-03-26 ENCOUNTER — Ambulatory Visit: Admitting: Physical Therapy

## 2024-03-26 ENCOUNTER — Encounter: Payer: Self-pay | Admitting: Physical Therapy

## 2024-03-26 DIAGNOSIS — M6281 Muscle weakness (generalized): Secondary | ICD-10-CM

## 2024-03-26 DIAGNOSIS — M5459 Other low back pain: Secondary | ICD-10-CM | POA: Diagnosis not present

## 2024-03-26 DIAGNOSIS — M25551 Pain in right hip: Secondary | ICD-10-CM

## 2024-03-26 DIAGNOSIS — R252 Cramp and spasm: Secondary | ICD-10-CM

## 2024-03-26 NOTE — Therapy (Signed)
 " OUTPATIENT PHYSICAL THERAPY THORACOLUMBAR TREATMENT   Patient Name: [name] MRN: [id number] DOB:[birth year], 83 y.o., female Today's Date: 03/26/2024  END OF SESSION:  PT End of Session - 03/26/24 1146     Visit Number 4    Date for Recertification  05/04/24    Authorization Type [employer] APPROVAL 16 VISITS; 03/09/24 - 06/07/2024 [id number]    Authorization Time Period 03/09/24 - 06/07/2024    Authorization - Visit Number 4    Authorization - Number of Visits 16    Progress Note Due on Visit 10    PT Start Time 1105    PT Stop Time 1144    PT Time Calculation (min) 39 min    Activity Tolerance Patient tolerated treatment well    Behavior During Therapy WFL for tasks assessed/performed             Past Medical History:  Diagnosis Date   Abnormal vaginal Pap smear 1994   annual paps for years after that.more recently every other year,last in 2012-we agreed not to do them anymore   Anxiety    no rx   Aortic stenosis    s/p AVR with bioprosthesis   Ascending aorta dilatation    40mm by echo 10/2021   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/2015   Carotid artery stenosis    < 50% stenosis bilaterally by doppler 07/2016   Coronary artery disease 2008   Coronary Ca score of 331 with minimal multivessel plaque < 25% stenosis by coronary CTA 8/23   Heart murmur    per pt   Hypercholesteremia    Hypertension    Obesity    Osteopenia    declines treatment   Pneumonia 1995   Pulmonary HTN (HCC)    mild to moderate by echo 7/23 with PASP   Shoulder pain    Due to arthritis   Vitamin D insufficiency    Past Surgical History:  Procedure Laterality Date   AORTIC VALVE REPLACEMENT N/A 10/14/2013   Procedure: AORTIC VALVE REPLACEMENT (AVR);  Surgeon: Dorise MARLA Fellers, MD;  Location: [facility] OR;  Service: Open Heart Surgery;  Laterality: N/A;   CARDIAC CATHETERIZATION     CATARACT EXTRACTION, BILATERAL     CHOLECYSTECTOMY  04/08/1988   ESOPHAGOGASTRODUODENOSCOPY (EGD) WITH PROPOFOL  N/A  09/05/2022   Procedure: ESOPHAGOGASTRODUODENOSCOPY (EGD) WITH PROPOFOL ;  Surgeon: Elicia Claw, MD;  Location: [facility] ENDOSCOPY;  Service: Gastroenterology;  Laterality: N/A;   INTRAOPERATIVE TRANSESOPHAGEAL ECHOCARDIOGRAM N/A 10/14/2013   Procedure: INTRAOPERATIVE TRANSESOPHAGEAL ECHOCARDIOGRAM;  Surgeon: Dorise MARLA Fellers, MD;  Location: [facility] OR;  Service: Open Heart Surgery;  Laterality: N/A;   IVC FILTER INSERTION N/A 09/06/2022   Procedure: IVC FILTER INSERTION;  Surgeon: Magda Debby SAILOR, MD;  Location: [facility] INVASIVE CV LAB;  Service: Cardiovascular;  Laterality: N/A;   KYPHOPLASTY Bilateral 03/27/2022   Procedure: KYPHOPLASTY AND BIOPSY THORACIC ELEVEN;  Surgeon: Lanis Pupa, MD;  Location: [facility] OR;  Service: Neurosurgery;  Laterality: Bilateral;   LEFT AND RIGHT HEART CATHETERIZATION WITH CORONARY ANGIOGRAM N/A 09/16/2013   Procedure: LEFT AND RIGHT HEART CATHETERIZATION WITH CORONARY ANGIOGRAM;  Surgeon: Wilbert JONELLE Bihari, MD;  Location: [facility] CATH LAB;  Service: Cardiovascular;  Laterality: N/A;   TONSILLECTOMY     Patient Active Problem List   Diagnosis Date Noted   Shoulder pain 06/25/2023   Hypokalemia 09/25/2022   Protein-calorie malnutrition, severe 09/02/2022   Pathological fracture of thoracic vertebra 08/31/2022   Acute renal failure superimposed on stage 3a chronic kidney disease (HCC) 08/31/2022   AMS (  altered mental status) 08/31/2022   Electrolyte abnormality 08/31/2022   Anemia 08/31/2022   Goals of care, counseling/discussion 08/31/2022   Acute encephalopathy 08/31/2022   Multiple myeloma (HCC) 08/27/2022   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/29/2022   Mediastinal mass 01/28/2022   Pulmonary HTN (HCC) 11/16/2021   Ascending aorta dilatation    Hyperlipidemia 04/16/2016   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/2015   Carotid artery stenosis 11/10/2013   S/P aortic valve replacement 10/17/2013   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  10/14/2013   Essential hypertension, benign 08/04/2013    PCP: Dwight Trula SQUIBB, MD   REFERRING PROVIDER: Vernetta Lonni GRADE, MD  REFERRING DIAG:  Diagnosis  M54.50,G89.29 (ICD-10-CM) - Chronic right-sided low back pain without sciatica    Rationale for Evaluation and Treatment: Rehabilitation  THERAPY DIAG:  Other low back pain  Pain in right hip  Muscle weakness (generalized)  Cramp and spasm  ONSET DATE: over a year ago  SUBJECTIVE:                                                                                                                                                                                           SUBJECTIVE STATEMENT: Patient reports she is doing good today. She is not currently having any pain.  From Eval: Patient presents with low back and right hip pain that began over a year ago. She has trouble with sit to stand transfers and walking. Some days are worst than others. Patient has numbness and tingling in both legs that goes down to her feet. This is aggravated with more standing and sitting activities.   PERTINENT HISTORY:  HTN; Multiple myeloma   PAIN: 03/24/24 Are you having pain? Yes: NPRS scale: 6/10 Pain location: low back; posterior right hip Pain description: back/hip: dull, achy  Aggravating factors: walking, transfers, bending Relieving factors: Medication   PRECAUTIONS: None  RED FLAGS: None   WEIGHT BEARING RESTRICTIONS: No  FALLS:  Has patient fallen in last 6 months? No  LIVING ENVIRONMENT: Lives with: lives alone Lives in: House/apartment Stairs: Yes: External: 6 steps; can reach both Has following equipment at home: Single point cane, Walker - 2 wheeled, shower chair, and Grab bars  OCCUPATION: Retired  PLOF: Independent, Independent with basic ADLs, Independent with household mobility without device, Independent with community mobility without device, Independent with gait, Independent with transfers, and  Leisure: Read; play with great grandchildren   PATIENT GOALS: To get over having pain  NEXT MD VISIT: PRN  OBJECTIVE:  Note: Objective measures were completed at Evaluation unless otherwise noted.  DIAGNOSTIC FINDINGS:  01/26/2024  MRI IMPRESSION: 1. Right gluteus minimus tendon tear with associated surrounding soft tissue edema and enhancement. Moderate amount of asymmetric fluid in the right greater trochanteric bursa. These findings likely contribute to the patient's right hip pain. 2. No other acute findings identified. 3. Grossly stable findings of treated multiple myeloma with chronic pathologic fractures. No acute fractures identified. 4. 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 5. Known slowly enlarging left adnexal cystic lesion demonstrates no aggressive characteristics and was not hypermetabolic on prior PET-CT. This supports a benign etiology.  PATIENT SURVEYS:  Modified Oswestry: 8/50 16%  Interpretation of scores: Score Category Description  0-20% Minimal Disability The patient can cope with most living activities. Usually no treatment is indicated apart from advice on lifting, sitting and exercise  21-40% Moderate Disability The patient experiences more pain and difficulty with sitting, lifting and standing. Travel and social life are more difficult and they may be disabled from work. Personal care, sexual activity and sleeping are not grossly affected, and the patient can usually be managed by conservative means  41-60% Severe Disability Pain remains the main problem in this group, but activities of daily living are affected. These patients require a detailed investigation  61-80% Crippled Back pain impinges on all aspects of the patients life. Positive intervention is required  81-100% Bed-bound These patients are either bed-bound or exaggerating their symptoms  Bluford FORBES Zoe DELENA Karon DELENA, et al. Surgery  versus conservative management of stable thoracolumbar fracture: the PRESTO feasibility RCT. [place] ([place]): [employer]; 2021 Nov. [employer] Assessment, No. 25.62.) Appendix 3, Oswestry Disability Index category descriptors. Available from: [email or web address].cz  Minimally Clinically Important Difference (MCID) = 12.8%  COGNITION: Overall cognitive status: Within functional limits for tasks assessed     SENSATION: Patient verbalizes numbness & tingling in bilateral legs  MUSCLE LENGTH: Hamstrings:decreased bilateral    POSTURE: rounded shoulders and increased thoracic kyphosis  PALPATION: Pain and tenderness with palpation of lumbar paraspinals   LUMBAR ROM: * pain  AROM eval  Flexion Bends to shins *  Extension   Right lateral flexion Bends past knee joint  Left lateral flexion Bends past knee joint  Right rotation   Left rotation 40% limited*   (Blank rows = not tested)  LOWER EXTREMITY ROM:   WFL bilateral   LOWER EXTREMITY MMT:    MMT Right eval Left eval  Hip flexion 4- 4  Hip extension    Hip abduction 4 4  Hip adduction 4 4  Hip internal rotation    Hip external rotation    Knee flexion 4 4  Knee extension 4- 4  Ankle dorsiflexion    Ankle plantarflexion    Ankle inversion    Ankle eversion     (Blank rows = not tested)    FUNCTIONAL TESTS:  5 times sit to stand: 23.73 sec with UE support (increased pain) Timed up and go (TUG): 14.74 sec  GAIT:  Comments: Decreased cadence & step length  TREATMENT DATE:  03/26/2024 NuStep Level 3 6 mins - PT present to discuss status Seated hamstring stretch 2 x 20 sec bilateral  Seated figure four stretch 2 x 20 sec bilateral  3 way stability ball stretch with blue stability ball x 8 forward  Seated ball press with purple ball x 10 Seated alt hand and knee press with purple ball x 10 bilateral  Seated shoulder row with red TB x 10  Seated shoulder ER with red  TB x 10 Sit to stand (with UE support) - cues to prevent knee valgus Seated hip to hip with 4# DB x 10 total- with TA activation  Seated ear to hip 4# DB x 10 each side- with TA activation  Seated hip adduction with ball squeeze 2 x 10 Seated hip abduction with red band x 20    03/24/2024 NuStep Level 3 7 mins - PT present to discuss status Seated hamstring stretch 2 x 20 sec bilateral  Seated figure four stretch 2 x 20 sec bilateral  Seated hip adduction with ball squeeze 2 x 10 Seated hip abduction with red band x 20 3 way stability ball stretch with blue stability ball x 8 forward  Seated hip to hip with 3# DB x 10 total- with TA activation  Seated ear to hip 3# DB x 10 each side- with TA activation     03/11/2024 NuStep Level 3 5 mins - PT present to discuss status Seated hamstring stretch 2 x 20 sec bilateral  Seated figure four stretch 2 x 20 sec bilateral  Seated LAQ 2 x 10 bilateral 0# Seated TA activation with ball press 2 x 10 Seated hip adduction with ball squeeze 2 x 10 Seated hip abduction with yellow loop x 20 3 way stability ball stretch with blue stability ball x 8 each direction  Standing march 2 x 10  Seated hip to hip with 3# DB x 10 total Seated ear to hip 3# DB x 10 each side    PATIENT EDUCATION:  Education details: Provided patient education and discussion on quality of life and stress management. Discussed motivators, challenges, and goals for future.   Person educated: Patient Education method: Explanation, Demonstration, and Handouts Education comprehension: verbalized understanding, returned demonstration, and needs further education  HOME EXERCISE PROGRAM: Access Code: [email or web address] URL: [email or web address] Date: 03/24/2024 Prepared by: Burnard  Exercises - Seated Hamstring Stretch  - 1 x daily - 7 x weekly - 2 sets - 20-30s hold - Seated Figure 4 Piriformis Stretch  - 1 x daily - 7 x weekly - 2 sets - 10 reps - 20-30s hold -  Seated Long Arc Quad  - 1 x daily - 7 x weekly - 1-2 sets - 10 reps - 3s hold - Seated Transversus Abdominis Bracing  - 1 x daily - 7 x weekly - 1 sets - 10 reps - Standing March with Counter Support  - 1 x daily - 7 x weekly - 1-2 sets - 10 reps - Seated Hip Adduction Isometrics with Ball  - 1 x daily - 7 x weekly - 1-2 sets - 10 reps - 5 hold - Seated Hip Abduction with Resistance  - 1 x daily - 7 x weekly - 1-2 sets - 10 reps  ASSESSMENT:  CLINICAL IMPRESSION: [name] presents to skilled therapy with no current pain or discomfort. She verbalized compliance with HEP. She required occasional cues for correct breathing technique while performing exercises. She demonstrated improved TA activation with exercises. PT monitored patient response throughout and required verbal and tactile cues for correct exercise performance. Patient will benefit from skilled PT to address the below impairments and improve overall function.     OBJECTIVE IMPAIRMENTS: Abnormal gait, decreased balance, decreased mobility, difficulty walking, decreased ROM, decreased strength, hypomobility, increased muscle spasms, impaired flexibility, impaired sensation, postural dysfunction, and pain.   ACTIVITY LIMITATIONS: carrying, lifting, bending, sitting, standing, squatting, transfers, bed mobility, reach over head, hygiene/grooming, and locomotion level  PARTICIPATION LIMITATIONS: meal  prep, cleaning, laundry, interpersonal relationship, shopping, and community activity  PERSONAL FACTORS: Age, Fitness, Time since onset of injury/illness/exacerbation, and 1-2 comorbidities: HTN; Hx melanoma  are also affecting patient's functional outcome.   REHAB POTENTIAL: Good  CLINICAL DECISION MAKING: Stable/uncomplicated  EVALUATION COMPLEXITY: Low   GOALS: Goals reviewed with patient? Yes  SHORT TERM GOALS: Target date: 04/06/2024  Patient will be independent with initial HEP. Baseline: doing current HEP (03/24/24) Goal  status: in progress   2.  Patient will report > or = to 30% improvement in low back and right hip pain  since starting PT. Baseline:  Goal status: INITIAL   3.  Patient will be able to participate in a 3/6 MWT to establish baseline for the test Baseline:  Goal status: INITIAL  4.  Patient will demonstrate a controlled descent when performing sit to stand transfer.  Baseline:  Goal status: INITIAL    LONG TERM GOALS: Target date: 05/04/2024  Patient will demonstrate independence in advanced HEP. Baseline:  Goal status: INITIAL  2.  Patient will report > or = to 50% improvement in back & right hip pain since starting PT. Baseline:  Goal status: INITIAL  3.  Patient will perform TUG in < or = to 12 sec to decrease falls risk. Baseline: 14.74 sec Goal status: INITIAL   4.  Patient will perform 5STS in < or = to 19 sec due to improved LE strength.  Baseline: 23.73 sec Goal status: INITIAL    PLAN:  PT FREQUENCY: 2x/week  PT DURATION: 8 weeks  PLANNED INTERVENTIONS: [id number]- PT Re-evaluation, 97110-Therapeutic exercises, 97530- Therapeutic activity, 97112- Neuromuscular re-education, 97535- Self Care, [id number]- Manual therapy, [id number]- Gait training, [phone]- Canalith repositioning, [id number]- Aquatic Therapy, [phone]- Electrical stimulation (unattended), [phone]- Electrical stimulation (manual), [id number]- Vasopneumatic device, [id number]- Ultrasound, [id number]- Traction (mechanical), [id number]- Ionotophoresis 4mg /ml Dexamethasone , [id number] (1-2 muscles), [id number] (3+ muscles)- Dry Needling, Patient/Family education, Balance training, Stair training, Taping, Joint mobilization, Joint manipulation, Spinal manipulation, Spinal mobilization, Vestibular training, Cryotherapy, and Moist heat.  PLAN FOR NEXT SESSION:  continue core strengthening and LE strengthening; lumbar mobility. 3 or 6 min walk test   Kristeen Sar, PT, DPT 03/26/2024 11:47 AM [facility] [address] [place], [place] [address] Phone # [phone] Fax [phone]   "

## 2024-03-29 ENCOUNTER — Ambulatory Visit

## 2024-03-29 DIAGNOSIS — R252 Cramp and spasm: Secondary | ICD-10-CM

## 2024-03-29 DIAGNOSIS — M25551 Pain in right hip: Secondary | ICD-10-CM

## 2024-03-29 DIAGNOSIS — M5459 Other low back pain: Secondary | ICD-10-CM | POA: Diagnosis not present

## 2024-03-29 DIAGNOSIS — M6281 Muscle weakness (generalized): Secondary | ICD-10-CM

## 2024-03-29 NOTE — Therapy (Signed)
 " OUTPATIENT PHYSICAL THERAPY THORACOLUMBAR TREATMENT   Patient Name: NAVNEET SCHMUCK MRN: [id number] DOB:[birth year], 83 y.o., female Today's Date: 03/29/2024  END OF SESSION:  PT End of Session - 03/29/24 1146     Visit Number 5    Date for Recertification  05/04/24    Authorization Type [employer] APPROVAL 16 VISITS; 03/09/24 - 06/07/2024 JLUY#[id number]    Authorization - Visit Number 5    Authorization - Number of Visits 16    Progress Note Due on Visit 10    PT Start Time 1103    PT Stop Time 1145    PT Time Calculation (min) 42 min    Activity Tolerance Patient tolerated treatment well    Behavior During Therapy WFL for tasks assessed/performed              Past Medical History:  Diagnosis Date   Abnormal vaginal Pap smear 1994   annual paps for years after that.more recently every other year,last in 2012-we agreed not to do them anymore   Anxiety    no rx   Aortic stenosis    s/p AVR with bioprosthesis   Ascending aorta dilatation    40mm by echo 10/2021   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/2015   Carotid artery stenosis    < 50% stenosis bilaterally by doppler 07/2016   Coronary artery disease 2008   Coronary Ca score of 331 with minimal multivessel plaque < 25% stenosis by coronary CTA 8/23   Heart murmur    per pt   Hypercholesteremia    Hypertension    Obesity    Osteopenia    declines treatment   Pneumonia 1995   Pulmonary HTN (HCC)    mild to moderate by echo 7/23 with PASP   Shoulder pain    Due to arthritis   Vitamin D insufficiency    Past Surgical History:  Procedure Laterality Date   AORTIC VALVE REPLACEMENT N/A 10/14/2013   Procedure: AORTIC VALVE REPLACEMENT (AVR);  Surgeon: Dorise MARLA Fellers, MD;  Location: [facility] OR;  Service: Open Heart Surgery;  Laterality: N/A;   CARDIAC CATHETERIZATION     CATARACT EXTRACTION, BILATERAL     CHOLECYSTECTOMY  04/08/1988   ESOPHAGOGASTRODUODENOSCOPY (EGD) WITH PROPOFOL  N/A 09/05/2022   Procedure: ESOPHAGOGASTRODUODENOSCOPY  (EGD) WITH PROPOFOL ;  Surgeon: Elicia Claw, MD;  Location: [facility] ENDOSCOPY;  Service: Gastroenterology;  Laterality: N/A;   INTRAOPERATIVE TRANSESOPHAGEAL ECHOCARDIOGRAM N/A 10/14/2013   Procedure: INTRAOPERATIVE TRANSESOPHAGEAL ECHOCARDIOGRAM;  Surgeon: Dorise MARLA Fellers, MD;  Location: [facility] OR;  Service: Open Heart Surgery;  Laterality: N/A;   IVC FILTER INSERTION N/A 09/06/2022   Procedure: IVC FILTER INSERTION;  Surgeon: Magda Debby SAILOR, MD;  Location: [facility] INVASIVE CV LAB;  Service: Cardiovascular;  Laterality: N/A;   KYPHOPLASTY Bilateral 03/27/2022   Procedure: KYPHOPLASTY AND BIOPSY THORACIC ELEVEN;  Surgeon: Lanis Pupa, MD;  Location: [facility] OR;  Service: Neurosurgery;  Laterality: Bilateral;   LEFT AND RIGHT HEART CATHETERIZATION WITH CORONARY ANGIOGRAM N/A 09/16/2013   Procedure: LEFT AND RIGHT HEART CATHETERIZATION WITH CORONARY ANGIOGRAM;  Surgeon: Wilbert JONELLE Bihari, MD;  Location: [facility] CATH LAB;  Service: Cardiovascular;  Laterality: N/A;   TONSILLECTOMY     Patient Active Problem List   Diagnosis Date Noted   Shoulder pain 06/25/2023   Hypokalemia 09/25/2022   Protein-calorie malnutrition, severe 09/02/2022   Pathological fracture of thoracic vertebra 08/31/2022   Acute renal failure superimposed on stage 3a chronic kidney disease (HCC) 08/31/2022   AMS (altered mental status) 08/31/2022   Electrolyte abnormality  08/31/2022   Anemia 08/31/2022   Goals of care, counseling/discussion 08/31/2022   Acute encephalopathy 08/31/2022   Multiple myeloma (HCC) 08/27/2022   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/29/2022   Mediastinal mass 01/28/2022   Pulmonary HTN (HCC) 11/16/2021   Ascending aorta dilatation    Hyperlipidemia 04/16/2016   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/2015   Carotid artery stenosis 11/10/2013   S/P aortic valve replacement 10/17/2013   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 10/14/2013   Essential hypertension, benign  08/04/2013    PCP: Dwight Trula SQUIBB, MD   REFERRING PROVIDER: Vernetta Lonni GRADE, MD  REFERRING DIAG:  Diagnosis  M54.50,G89.29 (ICD-10-CM) - Chronic right-sided low back pain without sciatica    Rationale for Evaluation and Treatment: Rehabilitation  THERAPY DIAG:  Other low back pain  Pain in right hip  Muscle weakness (generalized)  Cramp and spasm  ONSET DATE: over a year ago  SUBJECTIVE:                                                                                                                                                                                           SUBJECTIVE STATEMENT: I've been doing my exercises.  Just stiff/sore today but not really pain.   From Eval: Patient presents with low back and right hip pain that began over a year ago. She has trouble with sit to stand transfers and walking. Some days are worst than others. Patient has numbness and tingling in both legs that goes down to her feet. This is aggravated with more standing and sitting activities.   PERTINENT HISTORY:  HTN; Multiple myeloma   PAIN: 03/29/24 Are you having pain? Yes: NPRS scale: 4/10 max Pain location: low back; posterior right hip Pain description: back/hip: dull, achy  Aggravating factors: walking, transfers, bending Relieving factors: Medication   PRECAUTIONS: None  RED FLAGS: None   WEIGHT BEARING RESTRICTIONS: No  FALLS:  Has patient fallen in last 6 months? No  LIVING ENVIRONMENT: Lives with: lives alone Lives in: House/apartment Stairs: Yes: External: 6 steps; can reach both Has following equipment at home: Single point cane, Walker - 2 wheeled, shower chair, and Grab bars  OCCUPATION: Retired  PLOF: Independent, Independent with basic ADLs, Independent with household mobility without device, Independent with community mobility without device, Independent with gait, Independent with transfers, and Leisure: Read; play with great grandchildren    PATIENT GOALS: To get over having pain  NEXT MD VISIT: PRN  OBJECTIVE:  Note: Objective measures were completed at Evaluation unless otherwise noted.  DIAGNOSTIC FINDINGS:  01/26/2024 MRI IMPRESSION: 1. Right gluteus minimus tendon  tear with associated surrounding soft tissue edema and enhancement. Moderate amount of asymmetric fluid in the right greater trochanteric bursa. These findings likely contribute to the patient's right hip pain. 2. No other acute findings identified. 3. Grossly stable findings of treated multiple myeloma with chronic pathologic fractures. No acute fractures identified. 4. 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 5. Known slowly enlarging left adnexal cystic lesion demonstrates no aggressive characteristics and was not hypermetabolic on prior PET-CT. This supports a benign etiology.  PATIENT SURVEYS:  Modified Oswestry: 8/50 16%  Interpretation of scores: Score Category Description  0-20% Minimal Disability The patient can cope with most living activities. Usually no treatment is indicated apart from advice on lifting, sitting and exercise  21-40% Moderate Disability The patient experiences more pain and difficulty with sitting, lifting and standing. Travel and social life are more difficult and they may be disabled from work. Personal care, sexual activity and sleeping are not grossly affected, and the patient can usually be managed by conservative means  41-60% Severe Disability Pain remains the main problem in this group, but activities of daily living are affected. These patients require a detailed investigation  61-80% Crippled Back pain impinges on all aspects of the patients life. Positive intervention is required  81-100% Bed-bound These patients are either bed-bound or exaggerating their symptoms  Bluford FORBES Zoe DELENA Karon DELENA, et al. Surgery versus conservative management of stable  thoracolumbar fracture: the PRESTO feasibility RCT. [place] ([place]): [employer]; 2021 Nov. [facility] Technology Assessment, No. 25.62.) Appendix 3, Oswestry Disability Index category descriptors. Available from: Findjewelers.cz  Minimally Clinically Important Difference (MCID) = 12.8%  COGNITION: Overall cognitive status: Within functional limits for tasks assessed     SENSATION: Patient verbalizes numbness & tingling in bilateral legs  MUSCLE LENGTH: Hamstrings:decreased bilateral    POSTURE: rounded shoulders and increased thoracic kyphosis  PALPATION: Pain and tenderness with palpation of lumbar paraspinals   LUMBAR ROM: * pain  AROM eval  Flexion Bends to shins *  Extension   Right lateral flexion Bends past knee joint  Left lateral flexion Bends past knee joint  Right rotation   Left rotation 40% limited*   (Blank rows = not tested)  LOWER EXTREMITY ROM:   WFL bilateral   LOWER EXTREMITY MMT:    MMT Right eval Left eval  Hip flexion 4- 4  Hip extension    Hip abduction 4 4  Hip adduction 4 4  Hip internal rotation    Hip external rotation    Knee flexion 4 4  Knee extension 4- 4  Ankle dorsiflexion    Ankle plantarflexion    Ankle inversion    Ankle eversion     (Blank rows = not tested)    FUNCTIONAL TESTS:  5 times sit to stand: 23.73 sec with UE support (increased pain) Timed up and go (TUG): 14.74 sec  03/29/24:  5x sit to stand: 16.19 seconds with use of Ues TUG: 11.61 seconds   GAIT:  Comments: Decreased cadence & step length  TREATMENT DATE:  03/29/2024 NuStep Level 3 x 6 mins - PT present to discuss status Seated hamstring stretch 2 x 20 sec bilateral  Seated figure four stretch 2 x 20 sec bilateral  3 way stability ball stretch with blue stability ball x 8 forward  Seated ball press with purple ball x 10 Seated alt hand and knee press with purple ball x 10 bilateral  Seated shoulder row  with  red TB x 10  Seated shoulder ER with red TB x 10 Sit to stand (with UE support) - cues to prevent knee valgus Seated hip to hip with 4# DB x 10 total- with TA activation  Seated ear to hip 4# DB x 10 each side- with TA activation  Seated hip adduction with ball squeeze 2 x 10 Seated hip abduction with red band x 20   03/26/2024 NuStep Level 3 6 mins - PT present to discuss status Seated hamstring stretch 2 x 20 sec bilateral  Seated figure four stretch 2 x 20 sec bilateral  3 way stability ball stretch with blue stability ball x 8 forward  Seated ball press with purple ball x 10 Seated alt hand and knee press with purple ball x 10 bilateral  Seated shoulder row with red TB x 10  Seated shoulder ER with red TB x 10 Sit to stand (with UE support) - cues to prevent knee valgus Seated hip to hip with 4# DB x 10 total- with TA activation  Seated ear to hip 4# DB x 10 each side- with TA activation  Seated hip adduction with ball squeeze 2 x 10 Seated hip abduction with red band x 20    03/24/2024 NuStep Level 3 7 mins - PT present to discuss status Seated hamstring stretch 2 x 20 sec bilateral  Seated figure four stretch 2 x 20 sec bilateral  Seated hip adduction with ball squeeze 2 x 10 Seated hip abduction with red band x 20 3 way stability ball stretch with blue stability ball x 8 forward  Seated hip to hip with 3# DB x 10 total- with TA activation  Seated ear to hip 3# DB x 10 each side- with TA activation      PATIENT EDUCATION:  Education details: Provided patient education and discussion on quality of life and stress management. Discussed motivators, challenges, and goals for future.   Person educated: Patient Education method: Explanation, Demonstration, and Handouts Education comprehension: verbalized understanding, returned demonstration, and needs further education  HOME EXERCISE PROGRAM: Access Code: [email or web address] URL: [email or web address] Date:  03/24/2024 Prepared by: Burnard  Exercises - Seated Hamstring Stretch  - 1 x daily - 7 x weekly - 2 sets - 20-30s hold - Seated Figure 4 Piriformis Stretch  - 1 x daily - 7 x weekly - 2 sets - 10 reps - 20-30s hold - Seated Long Arc Quad  - 1 x daily - 7 x weekly - 1-2 sets - 10 reps - 3s hold - Seated Transversus Abdominis Bracing  - 1 x daily - 7 x weekly - 1 sets - 10 reps - Standing March with Counter Support  - 1 x daily - 7 x weekly - 1-2 sets - 10 reps - Seated Hip Adduction Isometrics with Ball  - 1 x daily - 7 x weekly - 1-2 sets - 10 reps - 5 hold - Seated Hip Abduction with Resistance  - 1 x daily - 7 x weekly - 1-2 sets - 10 reps  ASSESSMENT:  CLINICAL IMPRESSION: Yehudis presents to skilled therapy with no current pain or discomfort. She reports that she feels stiff and sore.  She has been working to keep abdominals braced with activity.  She continues to report compliance with HEP.  5x sit to stand and TUG times are significantly improved today, indicating improved balance overall.  She does continue to require UE support with sit to stand.  She required occasional cues  for correct breathing technique while performing exercises. She demonstrated improved TA activation with exercises. PT monitored patient response throughout and required verbal and tactile cues for correct exercise performance. Patient will benefit from skilled PT to address the below impairments and improve overall function.     OBJECTIVE IMPAIRMENTS: Abnormal gait, decreased balance, decreased mobility, difficulty walking, decreased ROM, decreased strength, hypomobility, increased muscle spasms, impaired flexibility, impaired sensation, postural dysfunction, and pain.   ACTIVITY LIMITATIONS: carrying, lifting, bending, sitting, standing, squatting, transfers, bed mobility, reach over head, hygiene/grooming, and locomotion level  PARTICIPATION LIMITATIONS: meal prep, cleaning, laundry, interpersonal relationship,  shopping, and community activity  PERSONAL FACTORS: Age, Fitness, Time since onset of injury/illness/exacerbation, and 1-2 comorbidities: HTN; Hx melanoma  are also affecting patient's functional outcome.   REHAB POTENTIAL: Good  CLINICAL DECISION MAKING: Stable/uncomplicated  EVALUATION COMPLEXITY: Low   GOALS: Goals reviewed with patient? Yes  SHORT TERM GOALS: Target date: 04/06/2024  Patient will be independent with initial HEP. Baseline: doing current HEP (03/24/24) Goal status: in progress   2.  Patient will report > or = to 30% improvement in low back and right hip pain  since starting PT. Baseline: pt denies any change in pain (03/29/24) Goal status: INITIAL   3.  Patient will be able to participate in a 3/6 MWT to establish baseline for the test Baseline:  Goal status: INITIAL  4.  Patient will demonstrate a controlled descent when performing sit to stand transfer.  Baseline: improved control although still not fully controlled (03/29/24) Goal status: In progress     LONG TERM GOALS: Target date: 05/04/2024  Patient will demonstrate independence in advanced HEP. Baseline:  Goal status: INITIAL  2.  Patient will report > or = to 50% improvement in back & right hip pain since starting PT. Baseline:  Goal status: INITIAL  3.  Patient will perform TUG in < or = to 12 sec to decrease falls risk. Baseline: 11.61 seconds (03/29/24) Goal status: MET  4.  Patient will perform 5STS in < or = to 19 sec due to improved LE strength.  Baseline: 16.19 seconds with UE support (03/29/24) Goal status: partially met   PLAN:  PT FREQUENCY: 2x/week  PT DURATION: 8 weeks  PLANNED INTERVENTIONS: 97164- PT Re-evaluation, 97110-Therapeutic exercises, 97530- Therapeutic activity, 97112- Neuromuscular re-education, 97535- Self Care, 02859- Manual therapy, [id number]- Gait training, [phone]- Canalith repositioning, [id number]- Aquatic Therapy, [phone]- Electrical stimulation (unattended),  [phone]- Electrical stimulation (manual), [id number]- Vasopneumatic device, [id number]- Ultrasound, [id number]- Traction (mechanical), [id number]- Ionotophoresis 4mg /ml Dexamethasone , 79439 (1-2 muscles), [id number] (3+ muscles)- Dry Needling, Patient/Family education, Balance training, Stair training, Taping, Joint mobilization, Joint manipulation, Spinal manipulation, Spinal mobilization, Vestibular training, Cryotherapy, and Moist heat.  PLAN FOR NEXT SESSION:  continue core strengthening and LE strengthening; lumbar mobility. 3 or 6 min walk test   Burnard Joy, PT 03/29/2024 11:48 AM  [facility] [address], [address] [place], [place] [address] Phone # [phone] Fax [phone]   "

## 2024-04-06 ENCOUNTER — Ambulatory Visit

## 2024-04-06 DIAGNOSIS — M25551 Pain in right hip: Secondary | ICD-10-CM

## 2024-04-06 DIAGNOSIS — M5459 Other low back pain: Secondary | ICD-10-CM

## 2024-04-06 DIAGNOSIS — M6281 Muscle weakness (generalized): Secondary | ICD-10-CM

## 2024-04-06 DIAGNOSIS — R252 Cramp and spasm: Secondary | ICD-10-CM

## 2024-04-06 NOTE — Therapy (Signed)
 Pt arrived for treatment and was not feeling well.  She was not able to stay for treatment.  She confirmed her appt for tomorrow and will let us  know if unable to make it.

## 2024-04-07 ENCOUNTER — Ambulatory Visit: Payer: Self-pay

## 2024-04-07 ENCOUNTER — Ambulatory Visit

## 2024-04-07 ENCOUNTER — Ambulatory Visit: Admitting: Physical Therapy

## 2024-04-08 ENCOUNTER — Encounter: Payer: Self-pay | Admitting: Internal Medicine

## 2024-04-12 ENCOUNTER — Ambulatory Visit: Admitting: Orthopaedic Surgery

## 2024-04-12 ENCOUNTER — Encounter: Payer: Self-pay | Admitting: Orthopaedic Surgery

## 2024-04-12 DIAGNOSIS — G8929 Other chronic pain: Secondary | ICD-10-CM

## 2024-04-12 DIAGNOSIS — M545 Low back pain, unspecified: Secondary | ICD-10-CM

## 2024-04-12 NOTE — Progress Notes (Signed)
 The patient is an 84 year old who comes in for follow-up after having been to physical therapy for her lower back pain.  She does have a history of multiple myeloma and was send this originally right hip pain but most of her pain is in the lower aspect of her lumbar spine.  She says that she is much better than she was before with going to physical therapy but did not go a lot because of being sick.  She does have another therapy appointment tomorrow.  She walks without an assistive device.  I did watch her get up from a seated position from a chair and she does stand up appropriately using the arms to push herself up from the chair but does not seem to be a struggle.  Her pain is in the lower aspect of the lumbar spine to both the right and the left.  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  Right now she is making good improvements so she can transition to home exercise program after tomorrow's physical therapy appointment.  She does state the Voltaren gel has helped.  If things worsen we could always see about setting her up for facet joint injections by Dr. Eldonna.  For now follow-up can be as needed since she is doing well.  If things worsen she knows to contact us .

## 2024-04-13 ENCOUNTER — Ambulatory Visit: Attending: Orthopaedic Surgery | Admitting: Physical Therapy

## 2024-04-13 ENCOUNTER — Encounter: Payer: Self-pay | Admitting: Physical Therapy

## 2024-04-13 DIAGNOSIS — R252 Cramp and spasm: Secondary | ICD-10-CM | POA: Diagnosis present

## 2024-04-13 DIAGNOSIS — M5459 Other low back pain: Secondary | ICD-10-CM | POA: Insufficient documentation

## 2024-04-13 DIAGNOSIS — M25551 Pain in right hip: Secondary | ICD-10-CM | POA: Diagnosis present

## 2024-04-13 DIAGNOSIS — M6281 Muscle weakness (generalized): Secondary | ICD-10-CM | POA: Insufficient documentation

## 2024-04-13 NOTE — Therapy (Signed)
 " OUTPATIENT PHYSICAL THERAPY THORACOLUMBAR TREATMENT/ DISCHARGE NOTE   Patient Name: [name] MRN: [id number] DOB:01/07/1941, 84 y.o., female Today's Date: 04/13/2024  END OF SESSION:  PT End of Session - 04/13/24 1232     Visit Number 6    Date for Recertification  05/04/24    Authorization Type [employer] APPROVAL 16 VISITS; 03/09/24 - 06/07/2024 [id number]    Authorization Time Period 03/09/24 - 06/07/2024    Authorization - Visit Number 6    Authorization - Number of Visits 16    Progress Note Due on Visit 10    PT Start Time 1104    PT Stop Time 1145    PT Time Calculation (min) 41 min    Activity Tolerance Patient tolerated treatment well    Behavior During Therapy WFL for tasks assessed/performed               Past Medical History:  Diagnosis Date   Abnormal vaginal Pap smear 1994   annual paps for years after that.more recently every other year,last in 2012-we agreed not to do them anymore   Anxiety    no rx   Aortic stenosis    s/p AVR with bioprosthesis   Ascending aorta dilatation    40mm by echo 10/2021   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/2015   Carotid artery stenosis    < 50% stenosis bilaterally by doppler 07/2016   Coronary artery disease 2008   Coronary Ca score of 331 with minimal multivessel plaque < 25% stenosis by coronary CTA 8/23   Heart murmur    per pt   Hypercholesteremia    Hypertension    Obesity    Osteopenia    declines treatment   Pneumonia 1995   Pulmonary HTN (HCC)    mild to moderate by echo 7/23 with PASP   Shoulder pain    Due to arthritis   Vitamin D insufficiency    Past Surgical History:  Procedure Laterality Date   AORTIC VALVE REPLACEMENT N/A 10/14/2013   Procedure: AORTIC VALVE REPLACEMENT (AVR);  Surgeon: Dorise MARLA Fellers, MD;  [facility];  Service: Open Heart Surgery;  Laterality: N/A;   CARDIAC CATHETERIZATION     CATARACT EXTRACTION, BILATERAL     CHOLECYSTECTOMY  04/08/1988   ESOPHAGOGASTRODUODENOSCOPY (EGD) WITH  PROPOFOL  N/A 09/05/2022   Procedure: ESOPHAGOGASTRODUODENOSCOPY (EGD) WITH PROPOFOL ;  Surgeon: Elicia Claw, MD;  Location: [facility] ENDOSCOPY;  Service: Gastroenterology;  Laterality: N/A;   INTRAOPERATIVE TRANSESOPHAGEAL ECHOCARDIOGRAM N/A 10/14/2013   Procedure: INTRAOPERATIVE TRANSESOPHAGEAL ECHOCARDIOGRAM;  Surgeon: Dorise MARLA Fellers, MD;  Location: [facility] OR;  Service: Open Heart Surgery;  Laterality: N/A;   IVC FILTER INSERTION N/A 09/06/2022   Procedure: IVC FILTER INSERTION;  Surgeon: Magda Debby SAILOR, MD;  Location: [facility] INVASIVE CV LAB;  Service: Cardiovascular;  Laterality: N/A;   KYPHOPLASTY Bilateral 03/27/2022   Procedure: KYPHOPLASTY AND BIOPSY THORACIC ELEVEN;  Surgeon: Lanis Pupa, MD;  Location: [facility] OR;  Service: Neurosurgery;  Laterality: Bilateral;   LEFT AND RIGHT HEART CATHETERIZATION WITH CORONARY ANGIOGRAM N/A 09/16/2013   Procedure: LEFT AND RIGHT HEART CATHETERIZATION WITH CORONARY ANGIOGRAM;  Surgeon: Wilbert JONELLE Bihari, MD;  Location: [facility] CATH LAB;  Service: Cardiovascular;  Laterality: N/A;   TONSILLECTOMY     Patient Active Problem List   Diagnosis Date Noted   Shoulder pain 06/25/2023   Hypokalemia 09/25/2022   Protein-calorie malnutrition, severe 09/02/2022   Pathological fracture of thoracic vertebra 08/31/2022   Acute renal failure superimposed on stage 3a chronic kidney disease (HCC)  08/31/2022   AMS (altered mental status) 08/31/2022   Electrolyte abnormality 08/31/2022   Anemia 08/31/2022   Goals of care, counseling/discussion 08/31/2022   Acute encephalopathy 08/31/2022   Multiple myeloma (HCC) 08/27/2022   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/29/2022   Mediastinal mass 01/28/2022   Pulmonary HTN (HCC) 11/16/2021   Ascending aorta dilatation    Hyperlipidemia 04/16/2016   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/2015   Carotid artery stenosis 11/10/2013   S/P aortic valve replacement 10/17/2013   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 10/14/2013   Essential hypertension, benign 08/04/2013    PCP: Dwight Trula SQUIBB, MD   REFERRING PROVIDER: Vernetta Lonni GRADE, MD  REFERRING DIAG:  Diagnosis  M54.50,G89.29 (ICD-10-CM) - Chronic right-sided low back pain without sciatica    Rationale for Evaluation and Treatment: Rehabilitation  THERAPY DIAG:  Other low back pain  Pain in right hip  Muscle weakness (generalized)  Cramp and spasm  ONSET DATE: over a year ago  SUBJECTIVE:                                                                                                                                                                                           SUBJECTIVE STATEMENT: Patient reports she is doing better than last session. She has shingles. The blisters are dried and she has two more days taking the medication   From Eval: Patient presents with low back and right hip pain that began over a year ago. She has trouble with sit to stand transfers and walking. Some days are worst than others. Patient has numbness and tingling in both legs that goes down to her feet. This is aggravated with more standing and sitting activities.   PERTINENT HISTORY:  HTN; Multiple myeloma   PAIN: 03/29/24 Are you having pain? Yes: NPRS scale: 4/10 max Pain location: low back; posterior right hip Pain description: back/hip: dull, achy  Aggravating factors: walking, transfers, bending Relieving factors: Medication   PRECAUTIONS: None  RED FLAGS: None   WEIGHT BEARING RESTRICTIONS: No  FALLS:  Has patient fallen in last 6 months? No  LIVING ENVIRONMENT: Lives with: lives alone Lives in: House/apartment Stairs: Yes: External: 6 steps; can reach both Has following equipment at home: Single point cane, Walker - 2 wheeled, shower chair, and Grab bars  OCCUPATION: Retired  PLOF: Independent, Independent with basic ADLs, Independent with household mobility without device, Independent with community  mobility without device, Independent with gait, Independent with transfers, and Leisure: Read; play with great grandchildren   PATIENT GOALS: To get over having pain  NEXT MD VISIT: PRN  OBJECTIVE:  Note: Objective measures were completed at Evaluation unless otherwise noted.  DIAGNOSTIC FINDINGS:  01/26/2024 MRI IMPRESSION: 1. Right gluteus minimus tendon tear with associated surrounding soft tissue edema and enhancement. Moderate amount of asymmetric fluid in the right greater trochanteric bursa. These findings likely contribute to the patient's right hip pain. 2. No other acute findings identified. 3. Grossly stable findings of treated multiple myeloma with chronic pathologic fractures. No acute fractures identified. 4. 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 5. Known slowly enlarging left adnexal cystic lesion demonstrates no aggressive characteristics and was not hypermetabolic on prior PET-CT. This supports a benign etiology.  PATIENT SURVEYS:  Modified Oswestry: 8/50 16%  Interpretation of scores: Score Category Description  0-20% Minimal Disability The patient can cope with most living activities. Usually no treatment is indicated apart from advice on lifting, sitting and exercise  21-40% Moderate Disability The patient experiences more pain and difficulty with sitting, lifting and standing. Travel and social life are more difficult and they may be disabled from work. Personal care, sexual activity and sleeping are not grossly affected, and the patient can usually be managed by conservative means  41-60% Severe Disability Pain remains the main problem in this group, but activities of daily living are affected. These patients require a detailed investigation  61-80% Crippled Back pain impinges on all aspects of the patients life. Positive intervention is required  81-100% Bed-bound These patients are either bed-bound  or exaggerating their symptoms  Bluford FORBES Zoe DELENA Karon DELENA, et al. Surgery versus conservative management of stable thoracolumbar fracture: the PRESTO feasibility RCT. [place] ([place]): [employer]; 2021 Nov. [facility] Technology Assessment, No. 25.62.) Appendix 3, Oswestry Disability Index category descriptors. Available from: Findjewelers.cz  Minimally Clinically Important Difference (MCID) = 12.8%  COGNITION: Overall cognitive status: Within functional limits for tasks assessed     SENSATION: Patient verbalizes numbness & tingling in bilateral legs  MUSCLE LENGTH: Hamstrings:decreased bilateral    POSTURE: rounded shoulders and increased thoracic kyphosis  PALPATION: Pain and tenderness with palpation of lumbar paraspinals   LUMBAR ROM: * pain  AROM eval  Flexion Bends to shins *  Extension   Right lateral flexion Bends past knee joint  Left lateral flexion Bends past knee joint  Right rotation   Left rotation 40% limited*   (Blank rows = not tested)  LOWER EXTREMITY ROM:   WFL bilateral   LOWER EXTREMITY MMT:    MMT Right eval Left eval  Hip flexion 4- 4  Hip extension    Hip abduction 4 4  Hip adduction 4 4  Hip internal rotation    Hip external rotation    Knee flexion 4 4  Knee extension 4- 4  Ankle dorsiflexion    Ankle plantarflexion    Ankle inversion    Ankle eversion     (Blank rows = not tested)    FUNCTIONAL TESTS:  5 times sit to stand: 23.73 sec with UE support (increased pain) Timed up and go (TUG): 14.74 sec  03/29/24:  5x sit to stand: 16.19 seconds with use of Ues TUG: 11.61 seconds     04/13/2024 5STS: 16.25 sec with UE support [id number] ft GAIT:  Comments: Decreased cadence & step length  TREATMENT DATE:  04/13/2024 NuStep Level 3 x 7 mins - PT present to discuss status Discussion of POC 5STS, , Goal Assessment  Review and update of HEP    03/29/2024 NuStep Level 3 x  6 mins -  PT present to discuss status Seated hamstring stretch 2 x 20 sec bilateral  Seated figure four stretch 2 x 20 sec bilateral  3 way stability ball stretch with blue stability ball x 8 forward  Seated ball press with purple ball x 10 Seated alt hand and knee press with purple ball x 10 bilateral  Seated shoulder row with red TB x 10  Seated shoulder ER with red TB x 10 Sit to stand (with UE support) - cues to prevent knee valgus Seated hip to hip with 4# DB x 10 total- with TA activation  Seated ear to hip 4# DB x 10 each side- with TA activation  Seated hip adduction with ball squeeze 2 x 10 Seated hip abduction with red band x 20   03/26/2024 NuStep Level 3 6 mins - PT present to discuss status Seated hamstring stretch 2 x 20 sec bilateral  Seated figure four stretch 2 x 20 sec bilateral  3 way stability ball stretch with blue stability ball x 8 forward  Seated ball press with purple ball x 10 Seated alt hand and knee press with purple ball x 10 bilateral  Seated shoulder row with red TB x 10  Seated shoulder ER with red TB x 10 Sit to stand (with UE support) - cues to prevent knee valgus Seated hip to hip with 4# DB x 10 total- with TA activation  Seated ear to hip 4# DB x 10 each side- with TA activation  Seated hip adduction with ball squeeze 2 x 10 Seated hip abduction with red band x 20    03/24/2024 NuStep Level 3 7 mins - PT present to discuss status Seated hamstring stretch 2 x 20 sec bilateral  Seated figure four stretch 2 x 20 sec bilateral  Seated hip adduction with ball squeeze 2 x 10 Seated hip abduction with red band x 20 3 way stability ball stretch with blue stability ball x 8 forward  Seated hip to hip with 3# DB x 10 total- with TA activation  Seated ear to hip 3# DB x 10 each side- with TA activation      PATIENT EDUCATION:  Education details: Provided patient education and discussion on quality of life and stress management. Discussed motivators,  challenges, and goals for future.   Person educated: Patient Education method: Explanation, Demonstration, and Handouts Education comprehension: verbalized understanding, returned demonstration, and needs further education  HOME EXERCISE PROGRAM: Access Code: [email or web address] URL: [email or web address] Date: 03/24/2024 Prepared by: Burnard  Exercises - Seated Hamstring Stretch  - 1 x daily - 7 x weekly - 2 sets - 20-30s hold - Seated Figure 4 Piriformis Stretch  - 1 x daily - 7 x weekly - 2 sets - 10 reps - 20-30s hold - Seated Long Arc Quad  - 1 x daily - 7 x weekly - 1-2 sets - 10 reps - 3s hold - Seated Transversus Abdominis Bracing  - 1 x daily - 7 x weekly - 1 sets - 10 reps - Standing March with Counter Support  - 1 x daily - 7 x weekly - 1-2 sets - 10 reps - Seated Hip Adduction Isometrics with Ball  - 1 x daily - 7 x weekly - 1-2 sets - 10 reps - 5 hold - Seated Hip Abduction with Resistance  - 1 x daily - 7 x weekly - 1-2 sets - 10 reps  ASSESSMENT:  CLINICAL IMPRESSION: [name] verbalized feeling 85% better since starting  skilled therapy. She has not had any notable back pain recently. She is independent with HEP at this time. Noted great improvements in 5STS and TUG time since evaluation. She had a follow up appointment with Dr. Vernetta yesterday and he was pleased with her progress and left it up to her if she continued skilled therapy.  All goals are met and patient is pleased with her functional status. Patient to discharge home with HEP.     OBJECTIVE IMPAIRMENTS: Abnormal gait, decreased balance, decreased mobility, difficulty walking, decreased ROM, decreased strength, hypomobility, increased muscle spasms, impaired flexibility, impaired sensation, postural dysfunction, and pain.   ACTIVITY LIMITATIONS: carrying, lifting, bending, sitting, standing, squatting, transfers, bed mobility, reach over head, hygiene/grooming, and locomotion level  PARTICIPATION LIMITATIONS:  meal prep, cleaning, laundry, interpersonal relationship, shopping, and community activity  PERSONAL FACTORS: Age, Fitness, Time since onset of injury/illness/exacerbation, and 1-2 comorbidities: HTN; Hx melanoma  are also affecting patient's functional outcome.   REHAB POTENTIAL: Good  CLINICAL DECISION MAKING: Stable/uncomplicated  EVALUATION COMPLEXITY: Low   GOALS: Goals reviewed with patient? Yes  SHORT TERM GOALS: Target date: 04/06/2024  Patient will be independent with initial HEP. Baseline: doing current HEP (03/24/24) Goal status: MET 04/13/2024  2.  Patient will report > or = to 30% improvement in low back and right hip pain  since starting PT. Baseline: pt denies any change in pain (03/29/24) Goal status: MET (85%) 04/13/2024   3.  Patient will be able to participate in a 3/6 MWT to establish baseline for the test Baseline:  Goal status: MET 04/13/2024  4.  Patient will demonstrate a controlled descent when performing sit to stand transfer.  Baseline: improved control although still not fully controlled (03/29/24) Goal status: MET 04/13/2024    LONG TERM GOALS: Target date: 05/04/2024  Patient will demonstrate independence in advanced HEP. Baseline:  Goal status: INITIAL  2.  Patient will report > or = to 50% improvement in back & right hip pain since starting PT. Baseline:  Goal status: MET (85%) 04/13/2024   3.  Patient will perform TUG in < or = to 12 sec to decrease falls risk. Baseline: 11.61 seconds (03/29/24) Goal status: MET  4.  Patient will perform 5STS in < or = to 19 sec due to improved LE strength.  Baseline: 16.19 seconds with UE support (03/29/24) Goal status: met 04/13/2024   PLAN:  PT FREQUENCY: 2x/week  PT DURATION: 8 weeks  PLANNED INTERVENTIONS: 97164- PT Re-evaluation, 97110-Therapeutic exercises, 97530- Therapeutic activity, 97112- Neuromuscular re-education, 97535- Self Care, [id number]- Manual therapy, [id number]- Gait training, [phone]- Canalith  repositioning, [id number]- Aquatic Therapy, [id number]- Electrical stimulation (unattended), [phone]- Electrical stimulation (manual), [id number]- Vasopneumatic device, [id number]- Ultrasound, [id number]- Traction (mechanical), [id number]- Ionotophoresis 4mg /ml Dexamethasone , [id number] (1-2 muscles), [id number] (3+ muscles)- Dry Needling, Patient/Family education, Balance training, Stair training, Taping, Joint mobilization, Joint manipulation, Spinal manipulation, Spinal mobilization, Vestibular training, Cryotherapy, and Moist heat.  PLAN FOR NEXT SESSION:  patient to discharge home with HEP   Kristeen Sar, PT, DPT 04/13/2024 12:33 PM [facility] [address] [place], [place] [address] Phone # [phone] Fax [phone]  PHYSICAL THERAPY DISCHARGE SUMMARY  Visits from Start of Care: 6  Current functional level related to goals / functional outcomes: All goals met. Patient pleased with functional status   Remaining deficits: None   Education / Equipment: See above   Patient agrees to discharge. Patient goals were met. Patient is being discharged due to being pleased with the current functional  level.   "

## 2024-04-15 ENCOUNTER — Ambulatory Visit

## 2024-04-20 ENCOUNTER — Ambulatory Visit: Admitting: Physical Therapy

## 2024-04-22 ENCOUNTER — Ambulatory Visit

## 2024-04-27 ENCOUNTER — Ambulatory Visit

## 2024-04-29 ENCOUNTER — Ambulatory Visit: Admitting: Physical Therapy

## 2024-05-01 ENCOUNTER — Other Ambulatory Visit: Payer: Self-pay | Admitting: Cardiology

## 2024-05-04 ENCOUNTER — Telehealth: Payer: Self-pay | Admitting: Medical Oncology

## 2024-05-04 ENCOUNTER — Ambulatory Visit: Admitting: Physical Therapy

## 2024-05-04 NOTE — Telephone Encounter (Signed)
 Pt deactivated from [facility] prescriber dashboard.

## 2024-05-19 ENCOUNTER — Inpatient Hospital Stay: Attending: Internal Medicine

## 2024-05-27 ENCOUNTER — Inpatient Hospital Stay

## 2024-05-27 ENCOUNTER — Inpatient Hospital Stay: Admitting: Internal Medicine

## 2024-06-01 ENCOUNTER — Ambulatory Visit: Admitting: Physician Assistant
